# Patient Record
Sex: Female | Born: 1980
Health system: Southern US, Community
[De-identification: ages and names within clinical notes are randomized; demographics above are authoritative.]

## PROBLEM LIST (undated history)

## (undated) ENCOUNTER — Inpatient Hospital Stay (HOSPITAL_COMMUNITY): Payer: Self-pay

## (undated) ENCOUNTER — Ambulatory Visit: Admission: EM | Payer: 59

## (undated) DIAGNOSIS — Z87442 Personal history of urinary calculi: Secondary | ICD-10-CM

## (undated) DIAGNOSIS — T8859XA Other complications of anesthesia, initial encounter: Secondary | ICD-10-CM

## (undated) DIAGNOSIS — K219 Gastro-esophageal reflux disease without esophagitis: Secondary | ICD-10-CM

## (undated) DIAGNOSIS — T4145XA Adverse effect of unspecified anesthetic, initial encounter: Secondary | ICD-10-CM

## (undated) DIAGNOSIS — N059 Unspecified nephritic syndrome with unspecified morphologic changes: Secondary | ICD-10-CM

## (undated) DIAGNOSIS — D649 Anemia, unspecified: Secondary | ICD-10-CM

## (undated) DIAGNOSIS — G43909 Migraine, unspecified, not intractable, without status migrainosus: Secondary | ICD-10-CM

## (undated) DIAGNOSIS — M797 Fibromyalgia: Secondary | ICD-10-CM

## (undated) DIAGNOSIS — I10 Essential (primary) hypertension: Secondary | ICD-10-CM

## (undated) DIAGNOSIS — M359 Systemic involvement of connective tissue, unspecified: Secondary | ICD-10-CM

## (undated) DIAGNOSIS — M069 Rheumatoid arthritis, unspecified: Secondary | ICD-10-CM

## (undated) DIAGNOSIS — G5603 Carpal tunnel syndrome, bilateral upper limbs: Secondary | ICD-10-CM

## (undated) DIAGNOSIS — N920 Excessive and frequent menstruation with regular cycle: Secondary | ICD-10-CM

## (undated) HISTORY — DX: Rheumatoid arthritis, unspecified: M06.9

## (undated) HISTORY — PX: ENDOMETRIAL ABLATION: SHX621

## (undated) HISTORY — PX: TYMPANOSTOMY TUBE PLACEMENT: SHX32

## (undated) HISTORY — DX: Essential (primary) hypertension: I10

---
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
## 2005-05-05 ENCOUNTER — Emergency Department: Payer: Self-pay | Admitting: Emergency Medicine

## 2005-05-28 ENCOUNTER — Emergency Department: Payer: Self-pay | Admitting: Emergency Medicine

## 2006-02-03 ENCOUNTER — Emergency Department: Payer: Self-pay | Admitting: Emergency Medicine

## 2006-02-10 ENCOUNTER — Emergency Department: Payer: Self-pay | Admitting: Emergency Medicine

## 2006-02-12 ENCOUNTER — Ambulatory Visit: Payer: Self-pay | Admitting: Obstetrics & Gynecology

## 2006-02-12 ENCOUNTER — Ambulatory Visit: Payer: Self-pay | Admitting: Family Medicine

## 2006-02-15 ENCOUNTER — Emergency Department: Payer: Self-pay | Admitting: Emergency Medicine

## 2006-03-01 ENCOUNTER — Ambulatory Visit (HOSPITAL_COMMUNITY): Admission: RE | Admit: 2006-03-01 | Discharge: 2006-03-01 | Payer: Self-pay | Admitting: Gynecology

## 2006-03-01 IMAGING — US US OB COMP LESS 14 WK
1 series · 18 of 23 positions shown · non-contrast
Comparison: none

CLINICAL DATA: Uncertain menstrual dates.  Estimated LMP [DATE].  Hypertension.  Evaluate dating and viability.  
OBSTETRICAL ULTRASOUND <14 WKS AND TRANSVAGINAL OB US:
TECHNIQUE: Both transabdominal and transvaginal ultrasound examinations were performed for complete evaluation of the gestation as well as the maternal uterus, adnexal regions, and pelvic cul-de-sac.

[Series 1: us ob comp less 14 wks · 18 of 23 slices shown]
[im 1/23]
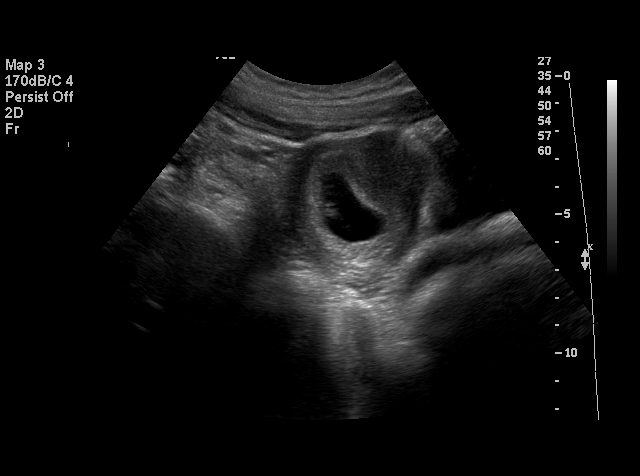
[im 2/23]
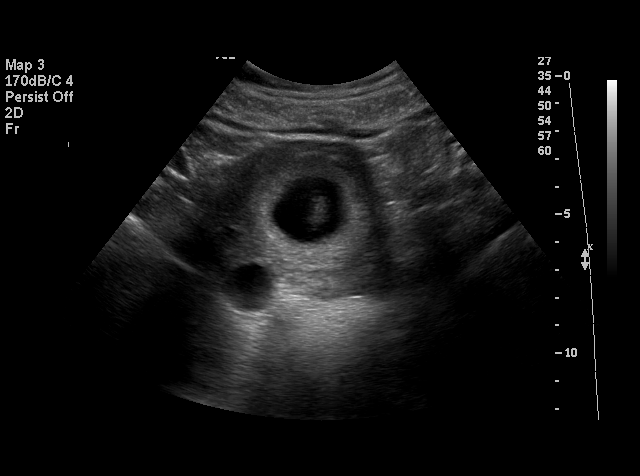
[im 4/23]
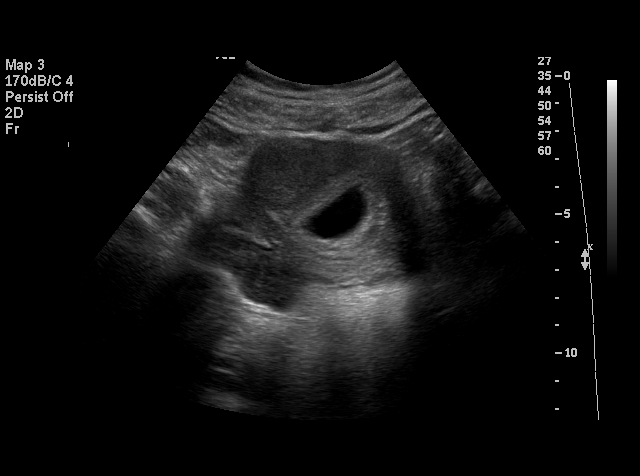
[im 5/23]
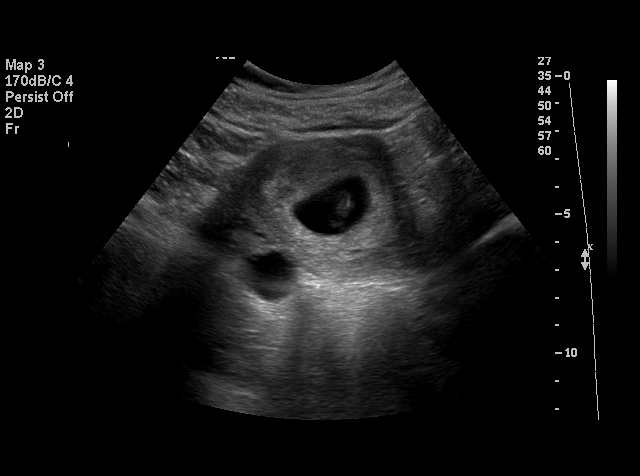
[im 6/23]
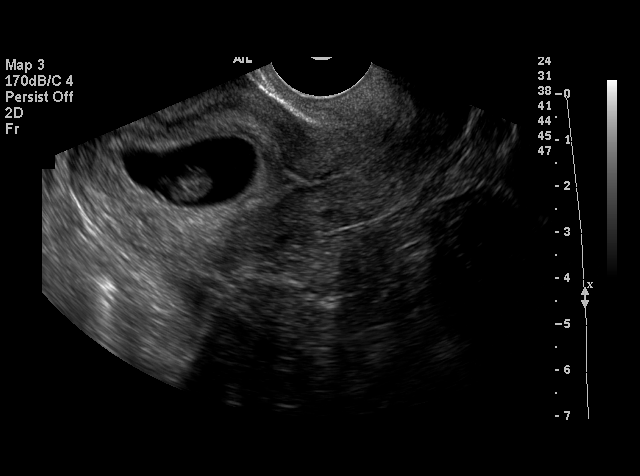
[im 8/23]
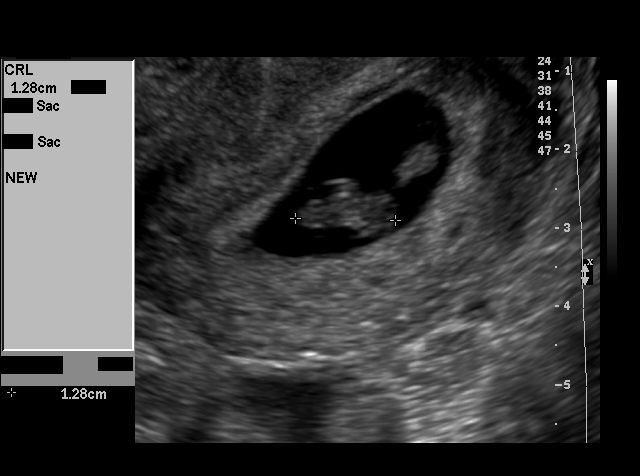
[im 9/23]
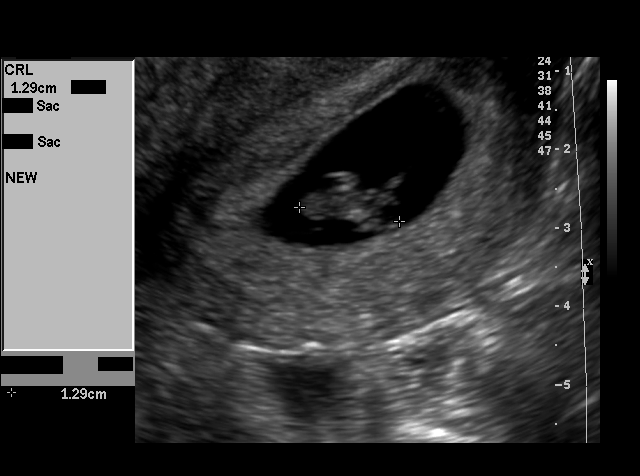
[im 10/23]
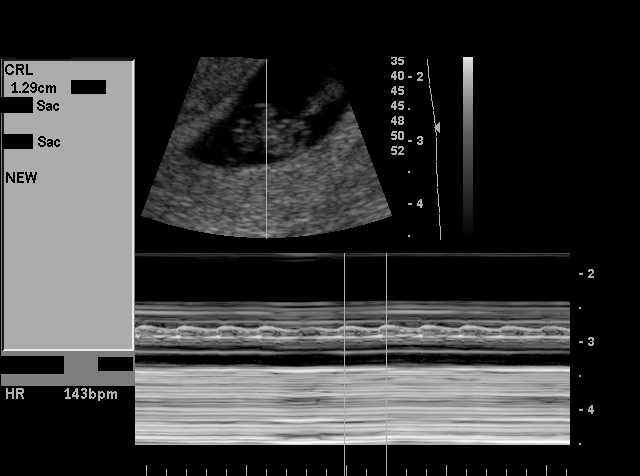
[im 11/23]
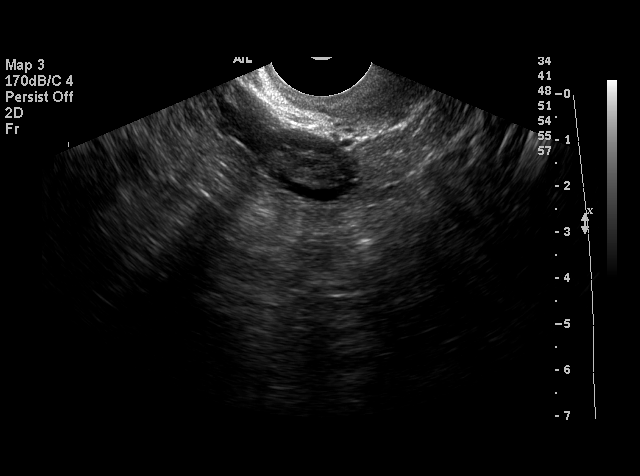
[im 13/23]
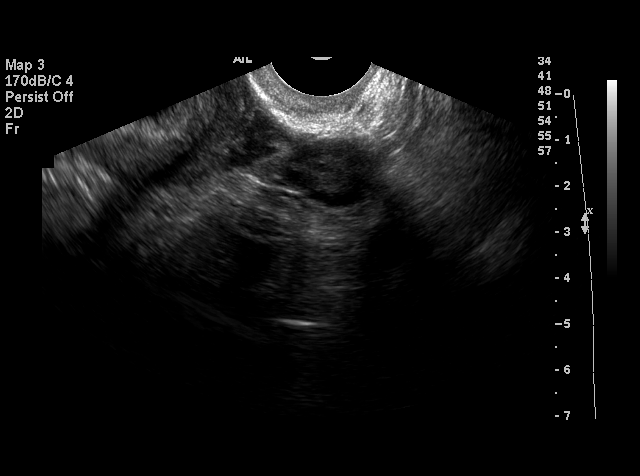
[im 14/23]
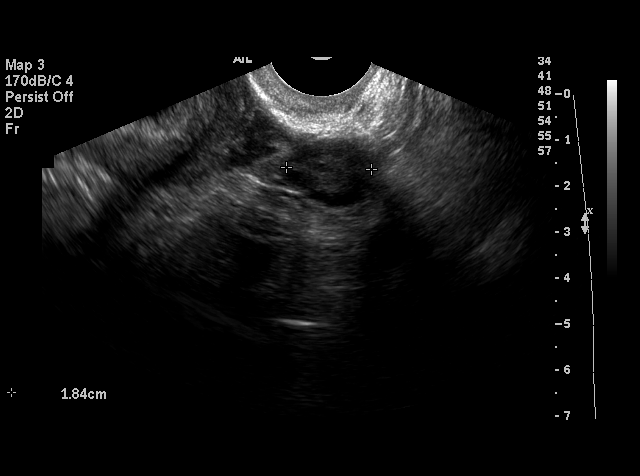
[im 15/23]
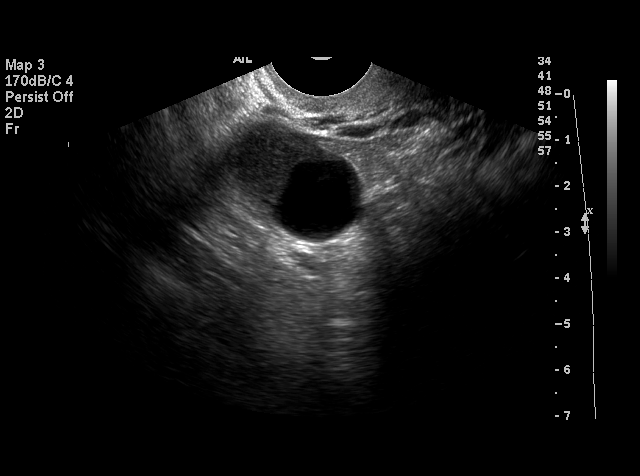
[im 16/23]
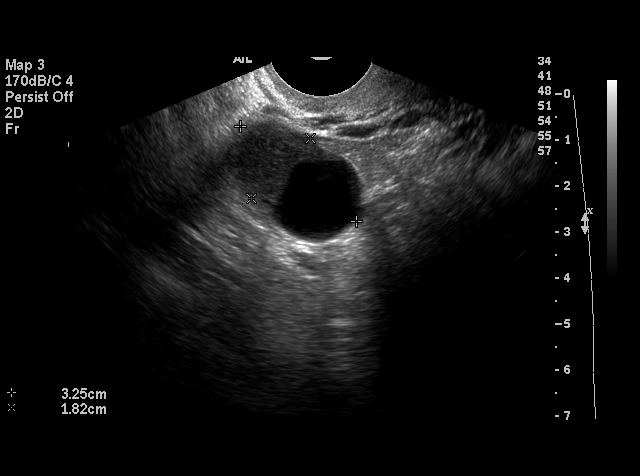
[im 18/23]
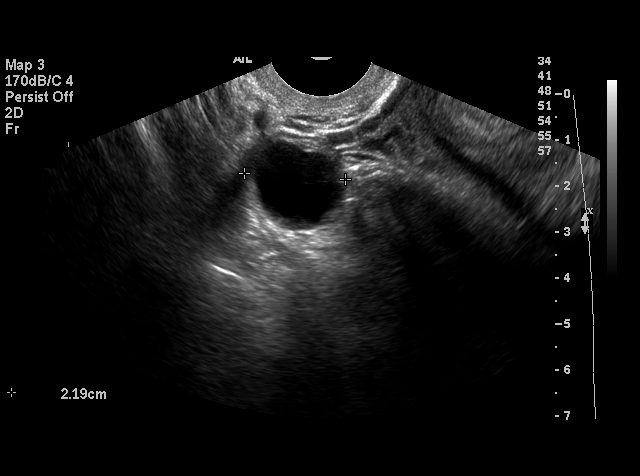
[im 19/23]
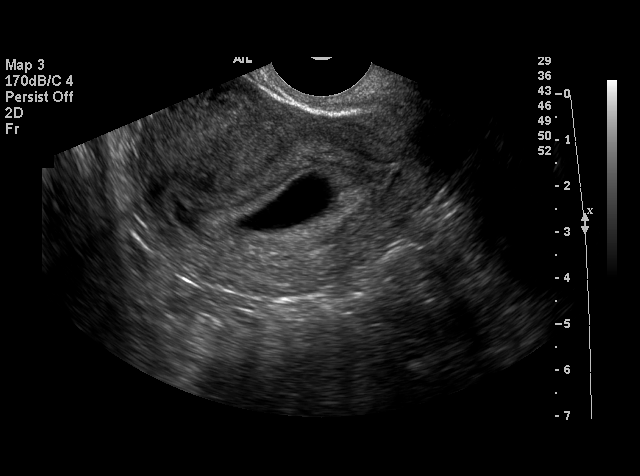
[im 20/23]
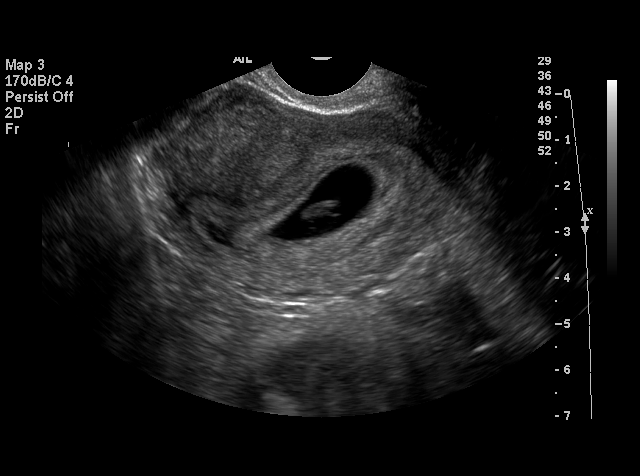
[im 22/23]
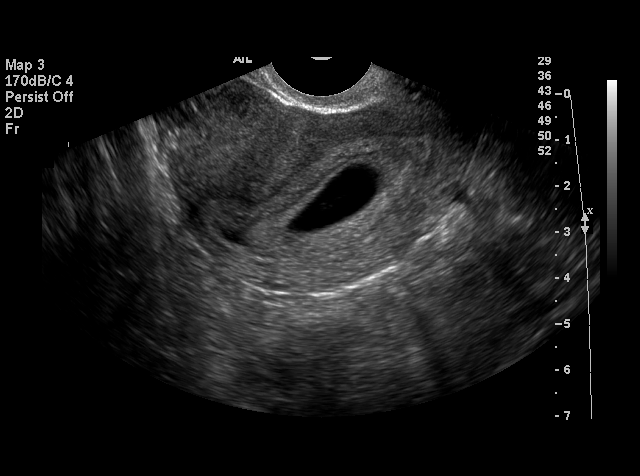
[im 23/23]
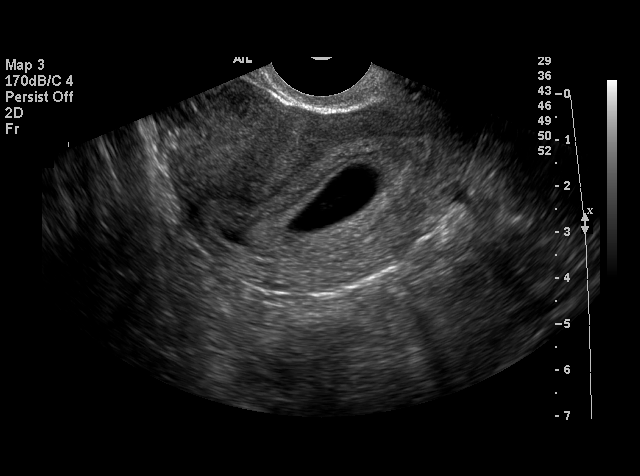

[18 of 23 positions shown; findings below may reference images not displayed]

FINDINGS: A single living intrauterine gestation is seen with measured heart rate of 143.  Embryonic crown-rump length measures 1.3 cm, corresponding with a gestational age of 7 weeks 4 days.  Normal yolk sac is seen.  A tiny implantation bleed is incidentally noted.  No fibroids other uterine abnormalities are identified.  
The right ovary contains a small corpus luteum cyst measuring approximately 2 cm.  Left ovary is normal on appearance.  No adnexal masses or free fluid are identified my either transabdominal or transvaginal sonography.
IMPRESSION: 1.  Single living intrauterine gestation with estimated gestational age of 7 weeks 4 days and sonographic EDC of [DATE].  This is concordant with stated LMP.  
2.  2 cm right ovarian corpus luteum cyst.  No evidence of adnexal mass or free fluid.

## 2006-03-07 ENCOUNTER — Encounter: Payer: Self-pay | Admitting: Obstetrics & Gynecology

## 2006-03-07 ENCOUNTER — Ambulatory Visit: Payer: Self-pay | Admitting: Obstetrics & Gynecology

## 2006-03-12 ENCOUNTER — Ambulatory Visit: Payer: Self-pay | Admitting: Obstetrics & Gynecology

## 2006-04-04 ENCOUNTER — Ambulatory Visit: Payer: Self-pay | Admitting: Obstetrics & Gynecology

## 2006-04-30 ENCOUNTER — Ambulatory Visit (HOSPITAL_COMMUNITY): Admission: RE | Admit: 2006-04-30 | Discharge: 2006-04-30 | Payer: Self-pay | Admitting: Obstetrics and Gynecology

## 2006-05-09 ENCOUNTER — Ambulatory Visit: Payer: Self-pay | Admitting: Obstetrics & Gynecology

## 2006-05-13 ENCOUNTER — Ambulatory Visit (HOSPITAL_COMMUNITY): Admission: RE | Admit: 2006-05-13 | Discharge: 2006-05-13 | Payer: Self-pay

## 2006-05-13 IMAGING — US US OB DETAIL+14 WK
1 series · 14 of 28 positions shown · non-contrast
Comparison: none

OBSTETRICAL ULTRASOUND:
 This ultrasound was performed in The [HOSPITAL], and the [facility] report will be stored to [REDACTED] PACS.

[Series 1: us ob detail+14 wk · 14 of 76 slices shown]
[im 3/76]
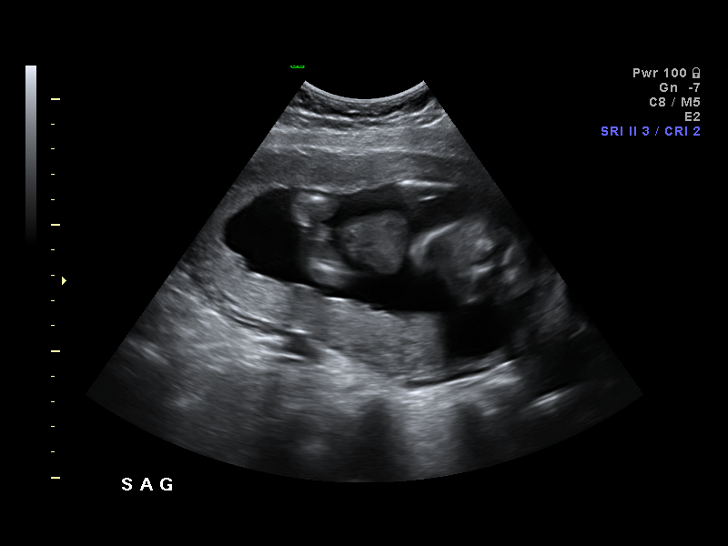
[im 9/76]
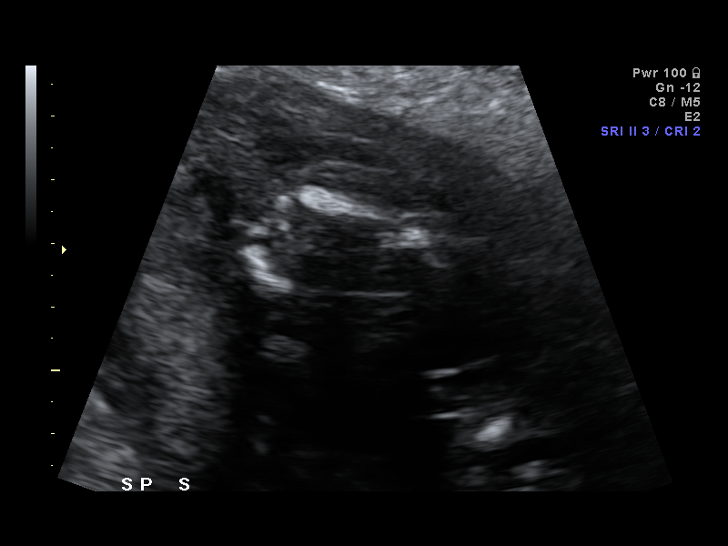
[im 14/76]
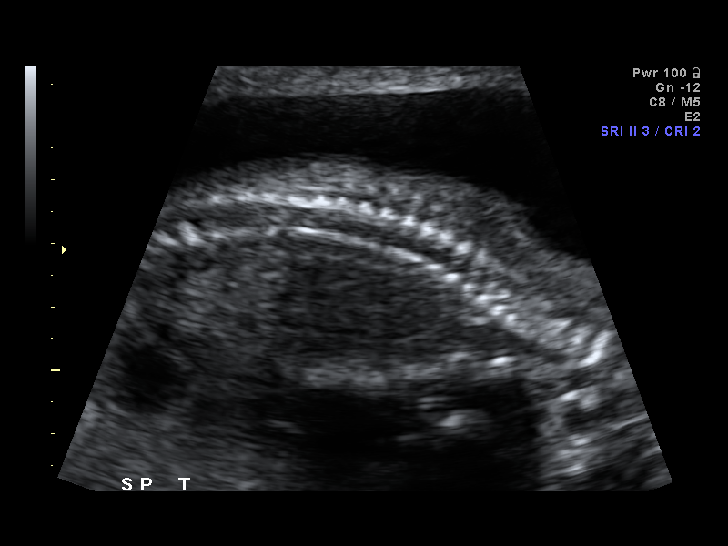
[im 20/76]
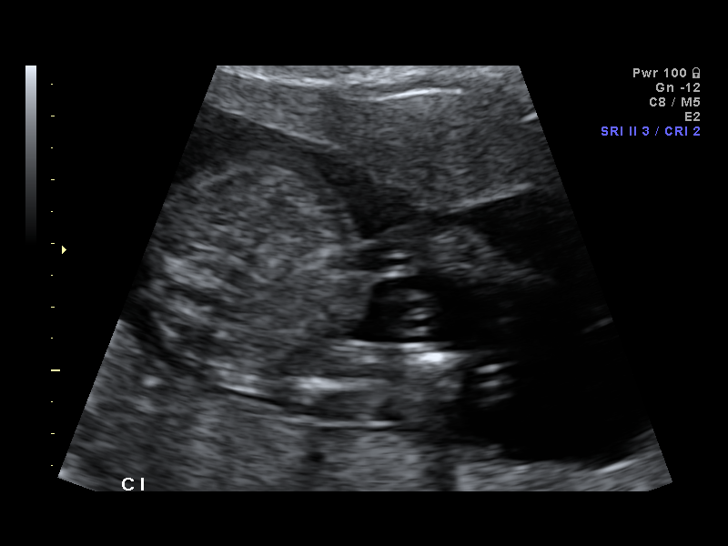
[im 26/76]
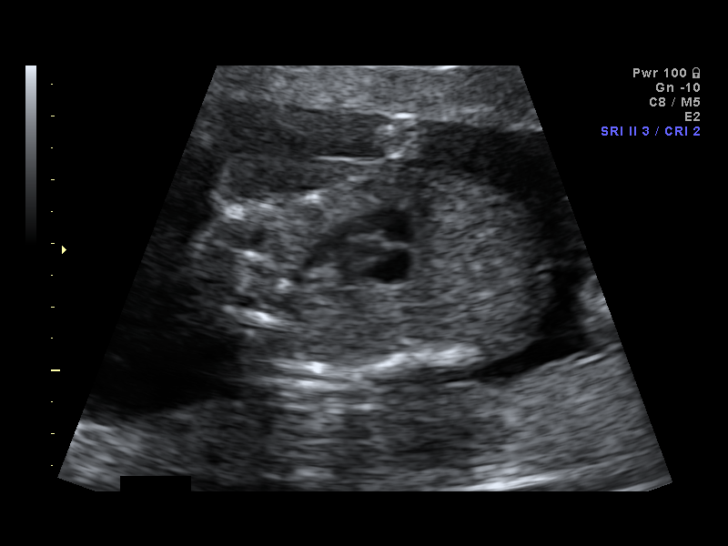
[im 31/76]
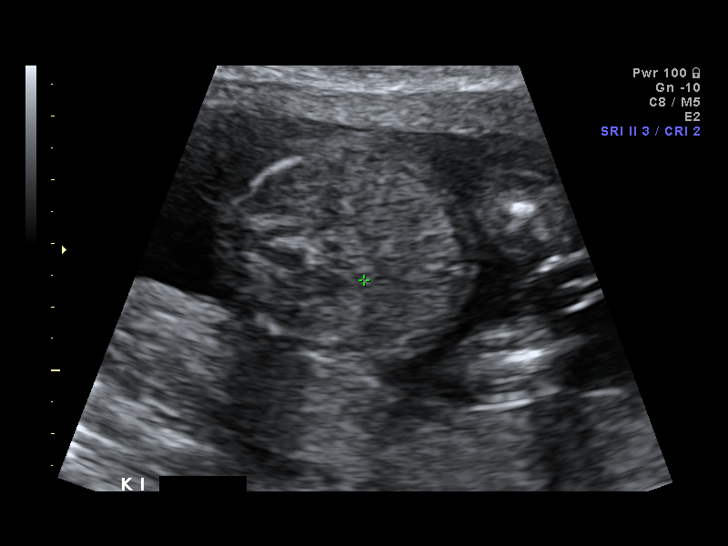
[im 37/76]
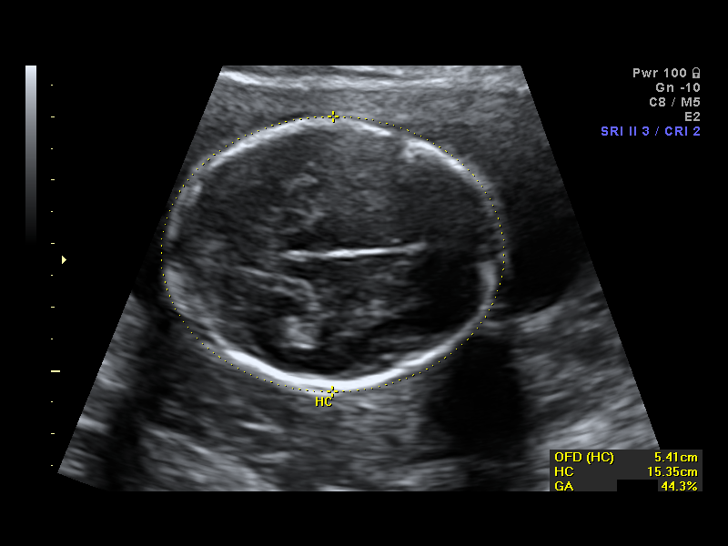
[im 42/76]
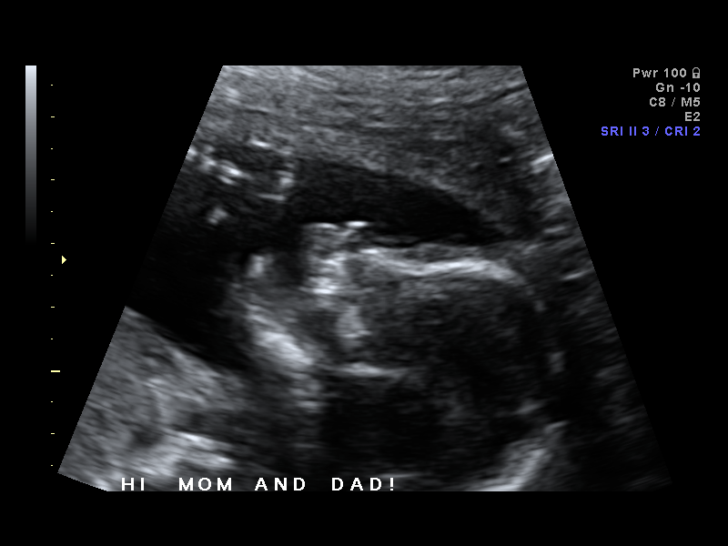
[im 48/76]
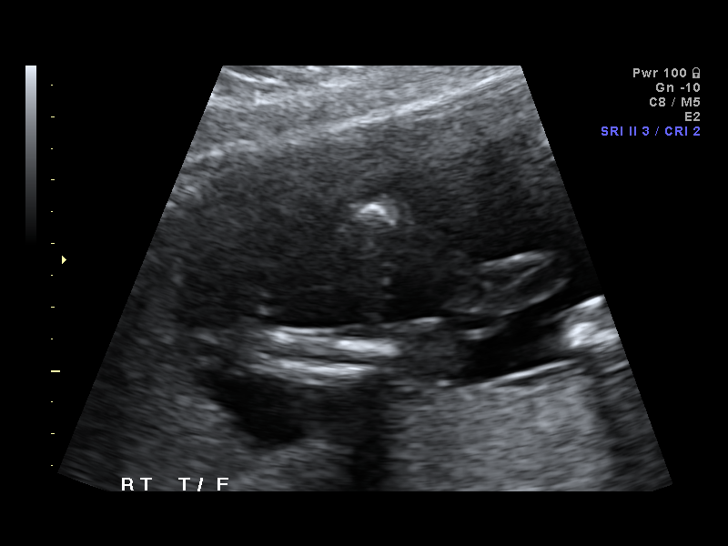
[im 53/76]
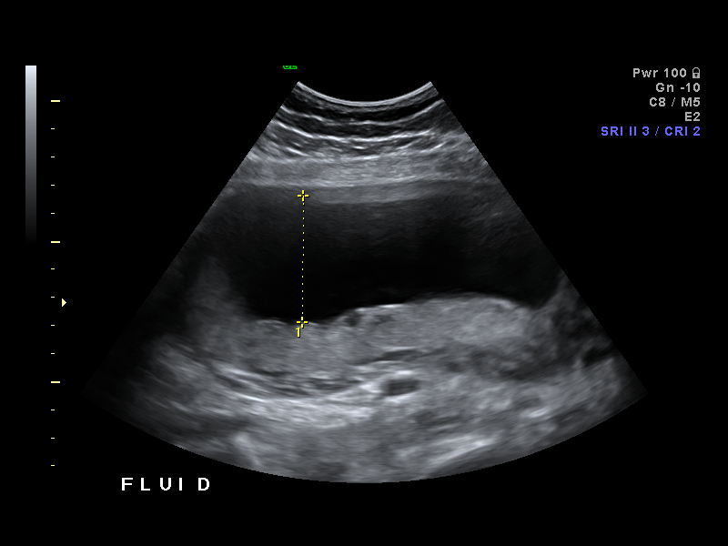
[im 59/76]
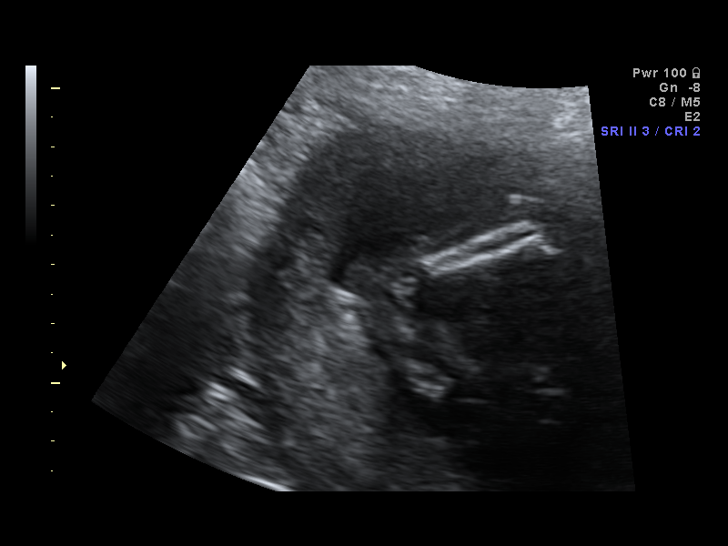
[im 64/76]
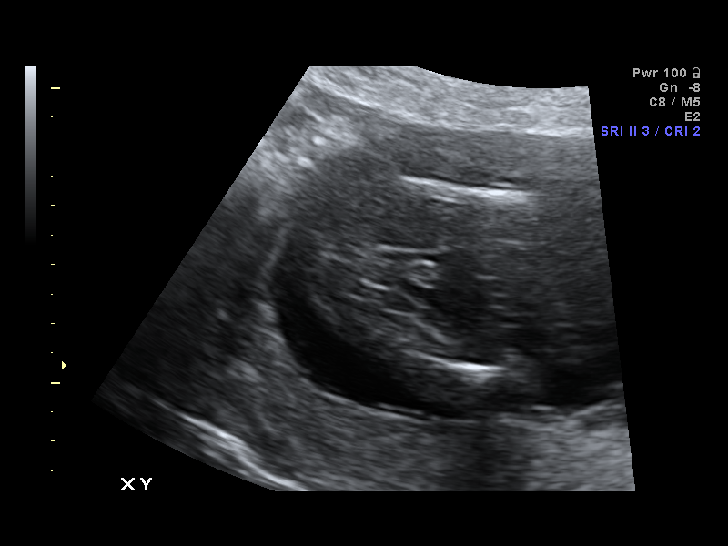
[im 70/76]
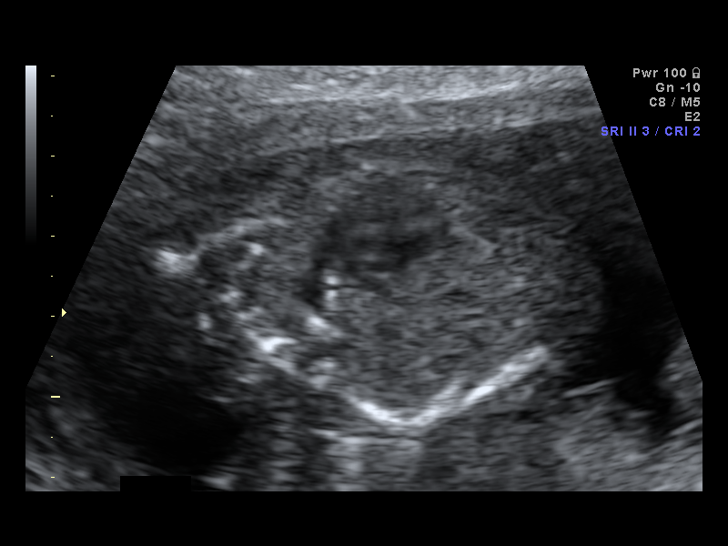
[im 76/76]
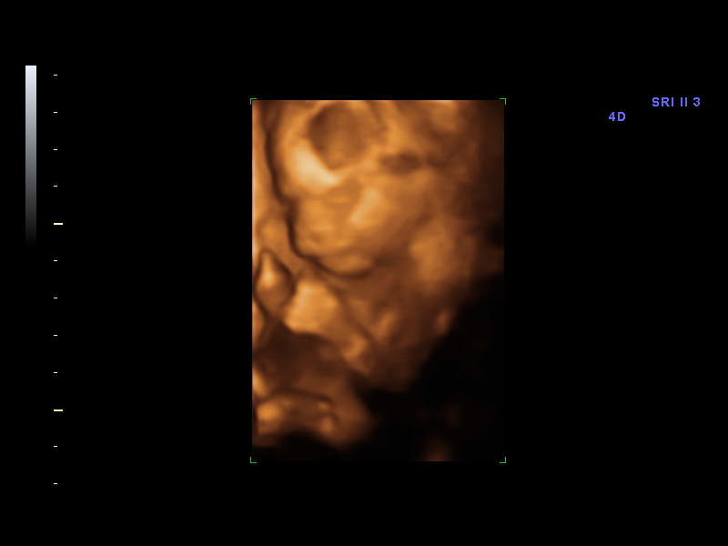

[14 of 28 positions shown; findings below may reference images not displayed]

IMPRESSION: The [facility] report has also been faxed to the ordering physician.

## 2006-05-18 ENCOUNTER — Inpatient Hospital Stay (HOSPITAL_COMMUNITY): Admission: AD | Admit: 2006-05-18 | Discharge: 2006-05-18 | Payer: Self-pay | Admitting: Gynecology

## 2006-05-30 ENCOUNTER — Ambulatory Visit: Payer: Self-pay | Admitting: Obstetrics & Gynecology

## 2006-06-17 ENCOUNTER — Ambulatory Visit (HOSPITAL_COMMUNITY): Admission: RE | Admit: 2006-06-17 | Discharge: 2006-06-17 | Payer: Self-pay | Admitting: Obstetrics & Gynecology

## 2006-06-17 IMAGING — US US OB FOLLOW-UP
1 series · 14 of 28 positions shown · non-contrast
Comparison: none

OBSTETRICAL ULTRASOUND:
 This ultrasound was performed in The [HOSPITAL], and the [facility] report will be stored to [REDACTED] PACS.

[Series 1: us ob follow-up · 14 of 39 slices shown]
[im 2/39]
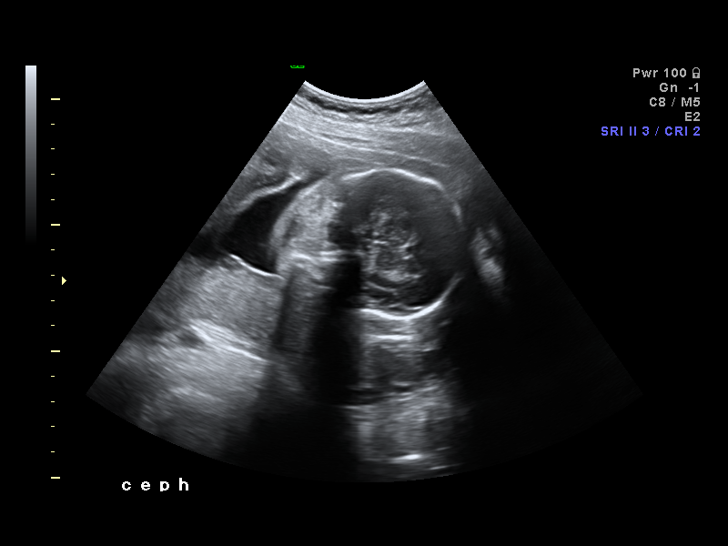
[im 5/39]
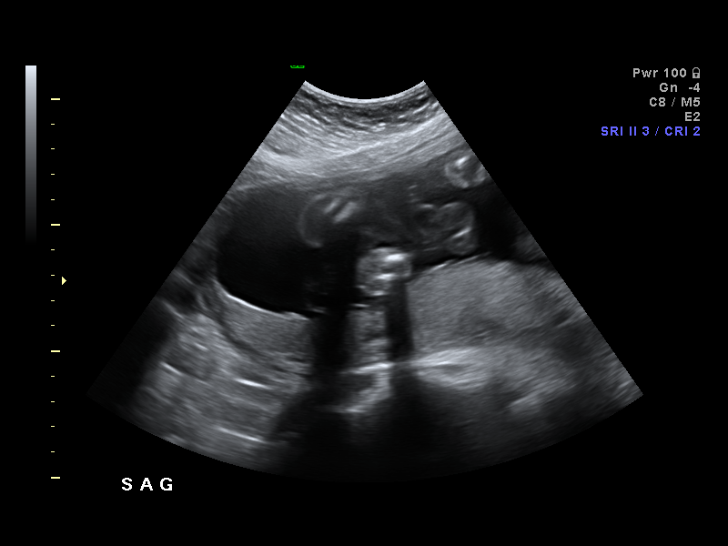
[im 8/39]
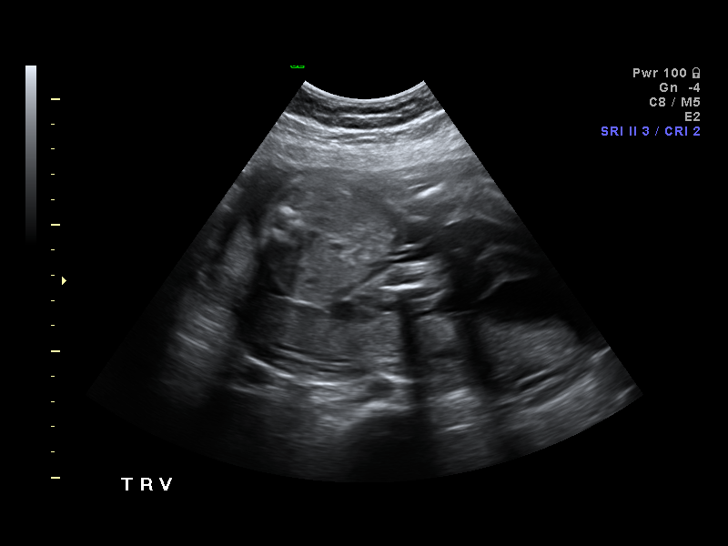
[im 10/39]
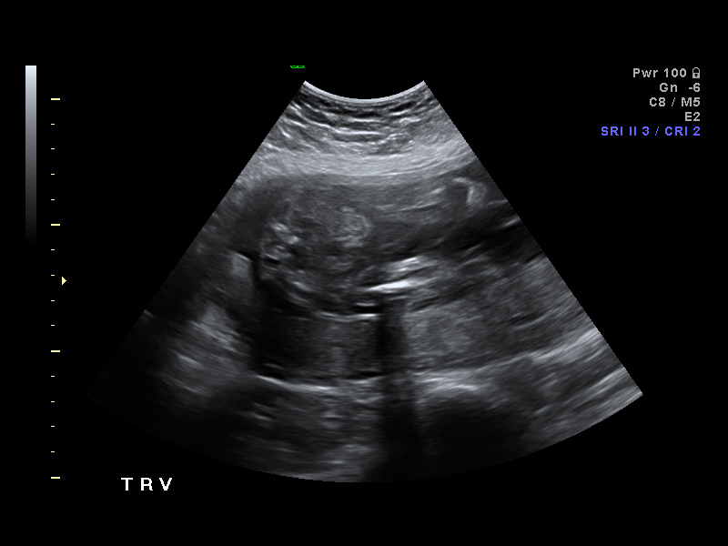
[im 13/39]
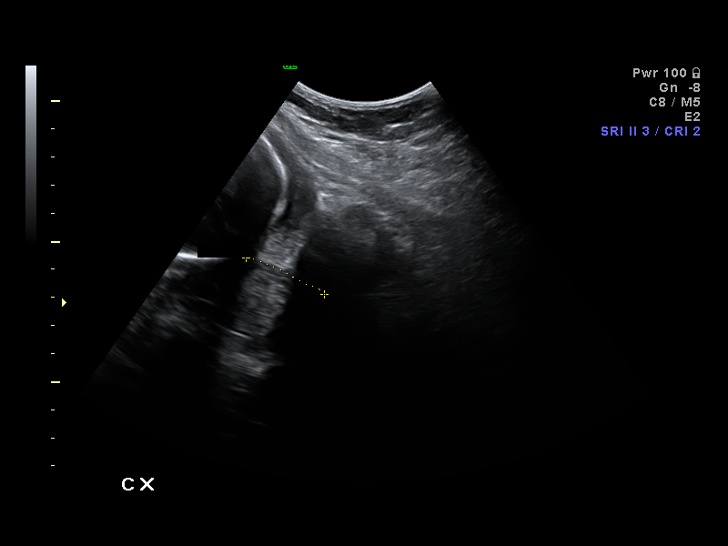
[im 16/39]
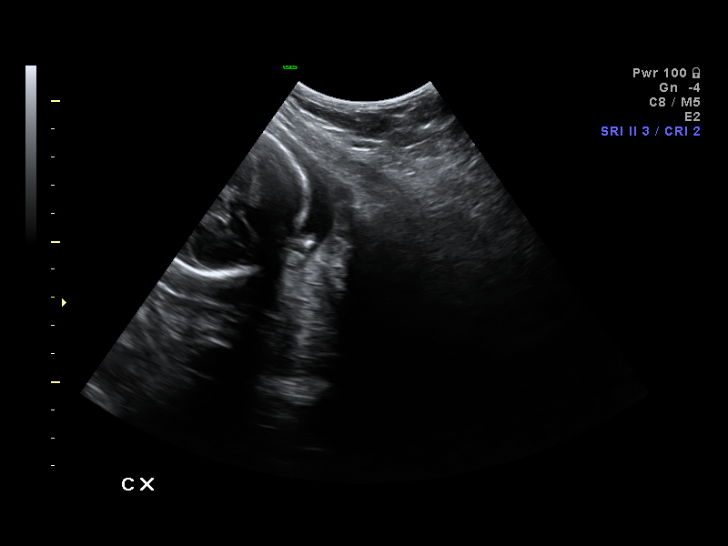
[im 19/39]
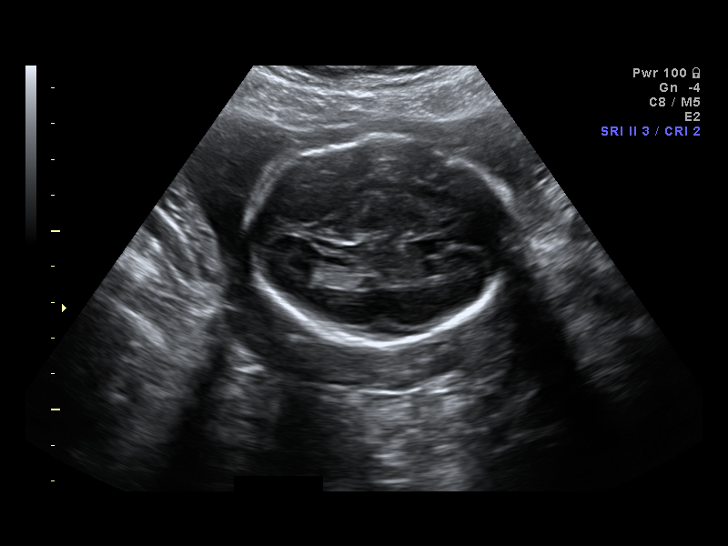
[im 22/39]
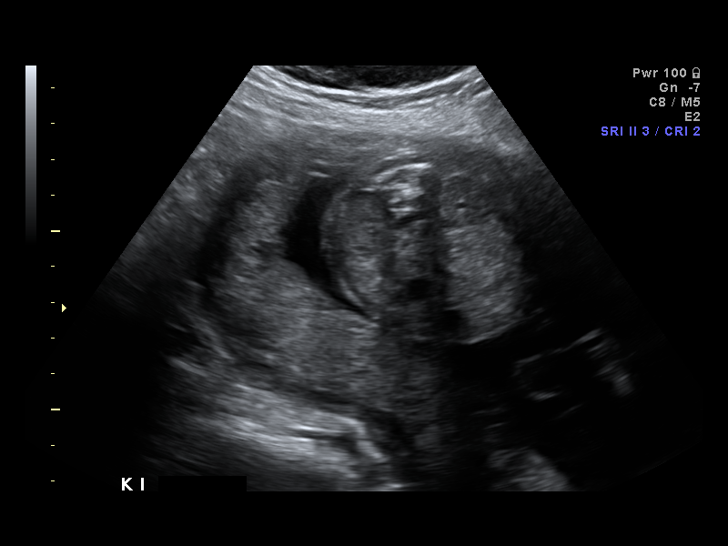
[im 24/39]
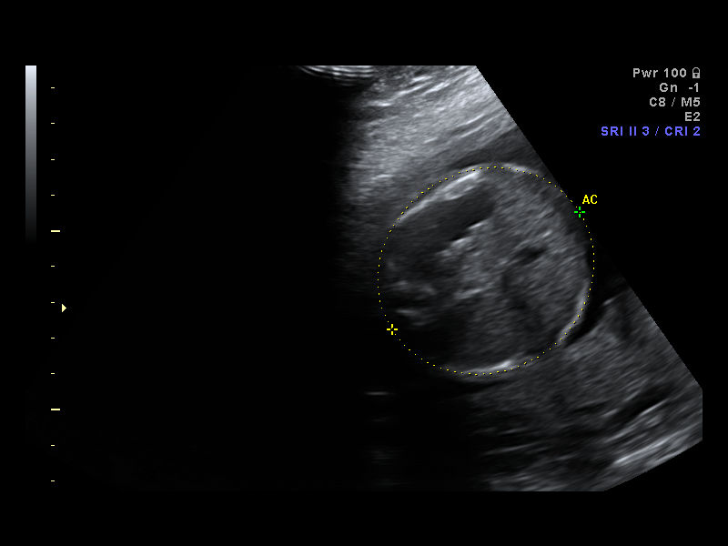
[im 27/39]
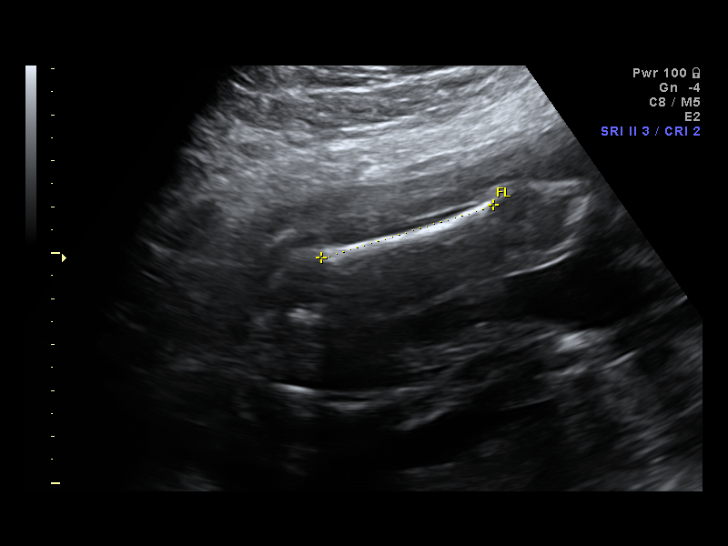
[im 30/39]
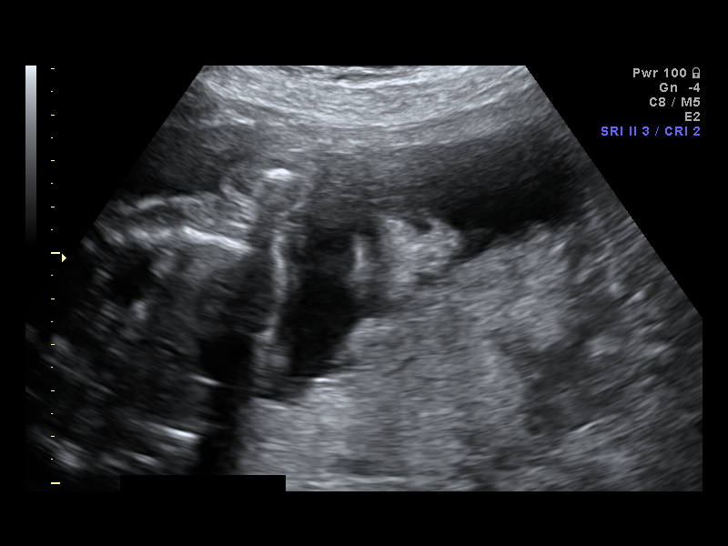
[im 33/39]
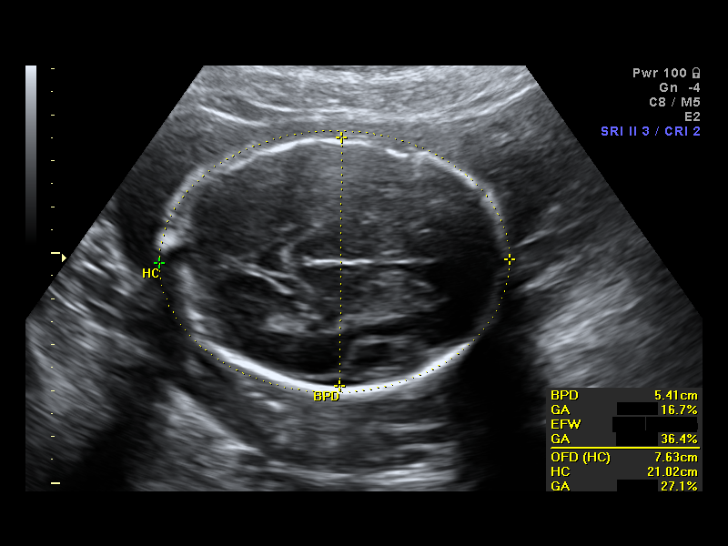
[im 36/39]
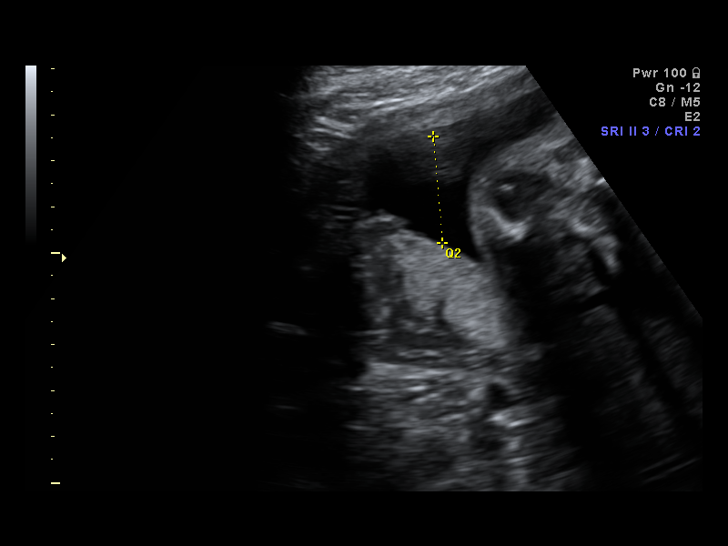
[im 39/39]
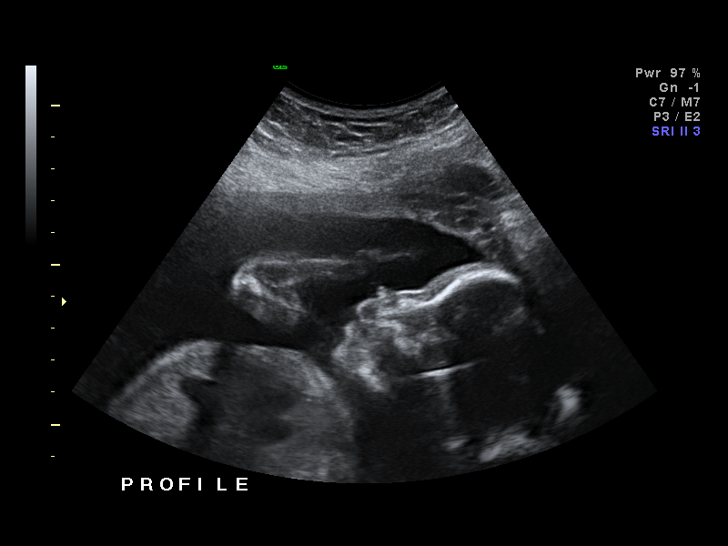

[14 of 28 positions shown; findings below may reference images not displayed]

IMPRESSION: The [facility] report has also been faxed to the ordering physician.

## 2006-06-20 ENCOUNTER — Ambulatory Visit: Payer: Self-pay | Admitting: Obstetrics & Gynecology

## 2006-06-29 ENCOUNTER — Inpatient Hospital Stay (HOSPITAL_COMMUNITY): Admission: AD | Admit: 2006-06-29 | Discharge: 2006-06-29 | Payer: Self-pay | Admitting: Obstetrics and Gynecology

## 2006-06-29 ENCOUNTER — Ambulatory Visit: Payer: Self-pay | Admitting: *Deleted

## 2006-07-11 ENCOUNTER — Ambulatory Visit: Payer: Self-pay | Admitting: Obstetrics & Gynecology

## 2006-07-19 ENCOUNTER — Ambulatory Visit (HOSPITAL_COMMUNITY): Admission: RE | Admit: 2006-07-19 | Discharge: 2006-07-19 | Payer: Self-pay | Admitting: Obstetrics & Gynecology

## 2006-07-19 IMAGING — US US OB FOLLOW-UP
1 series · 14 of 28 positions shown · non-contrast
Comparison: none

OBSTETRICAL ULTRASOUND:
 This ultrasound was performed in The [HOSPITAL], and the [facility] report will be stored to [REDACTED] PACS.

[Series 1: us ob follow-up · 14 of 54 slices shown]
[im 2/54]
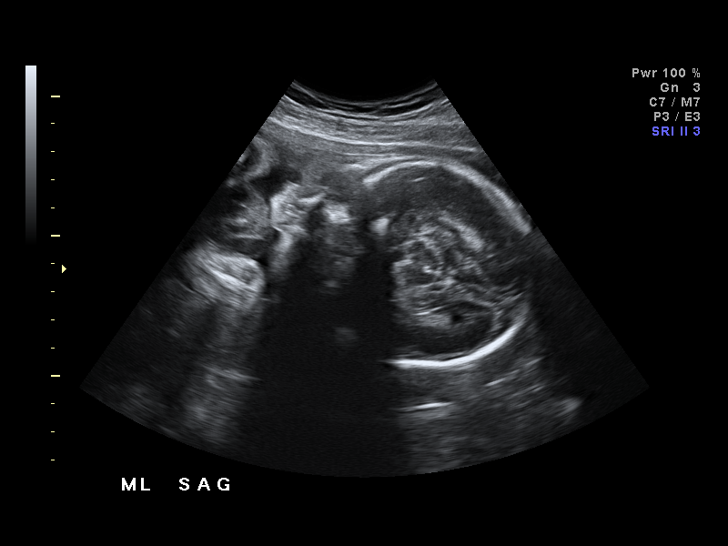
[im 6/54]
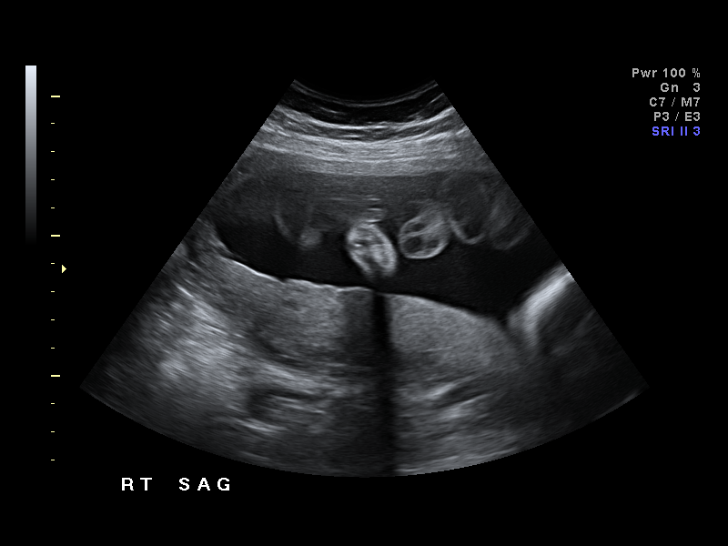
[im 10/54]
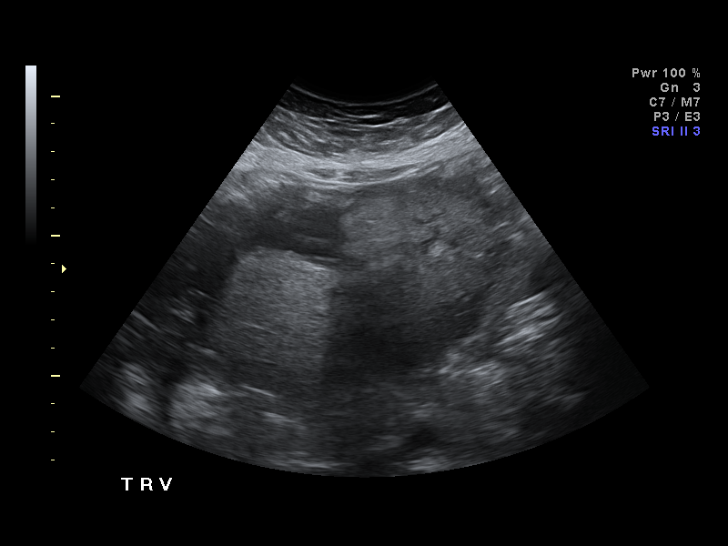
[im 14/54]
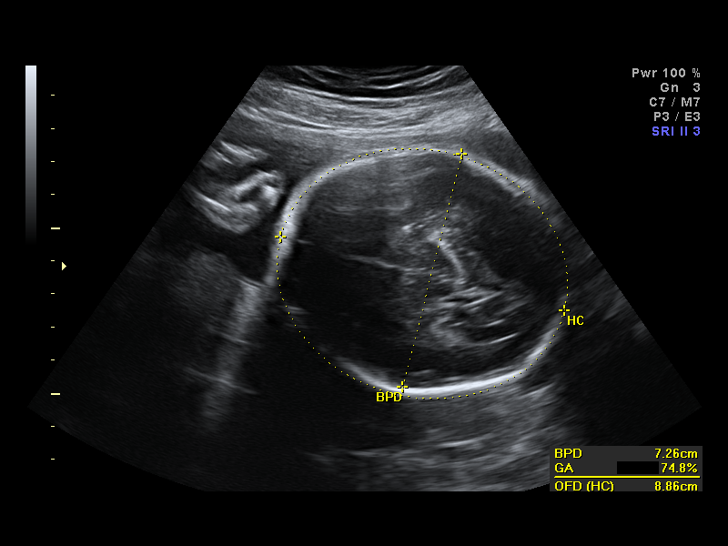
[im 18/54]
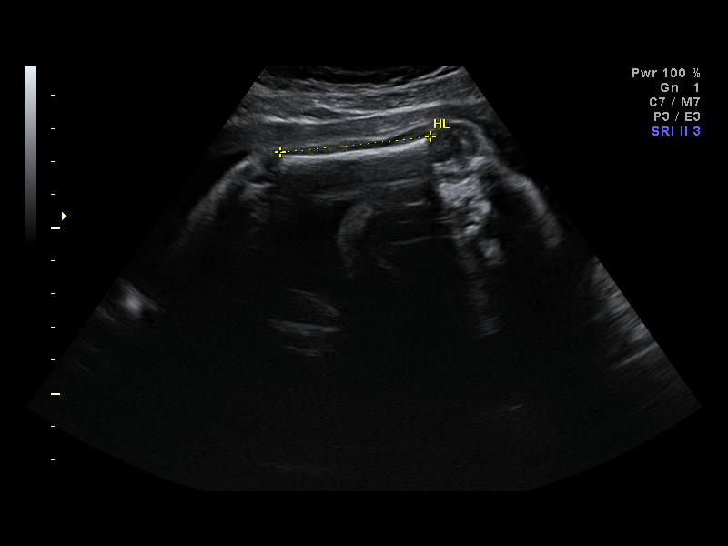
[im 22/54]
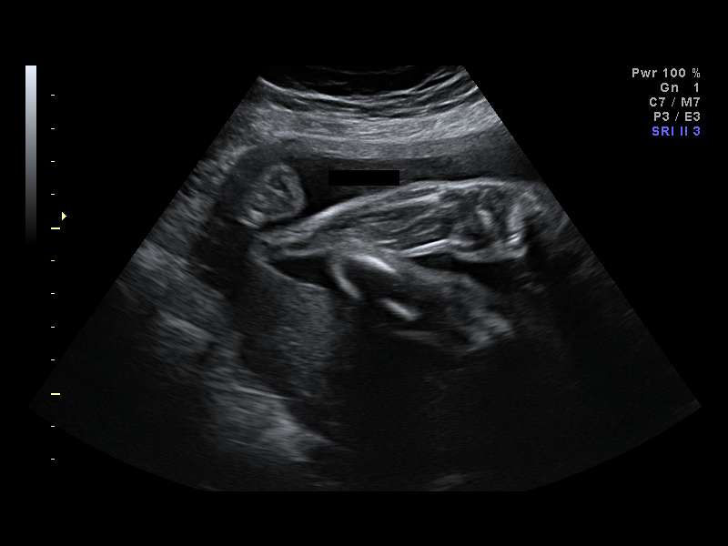
[im 26/54]
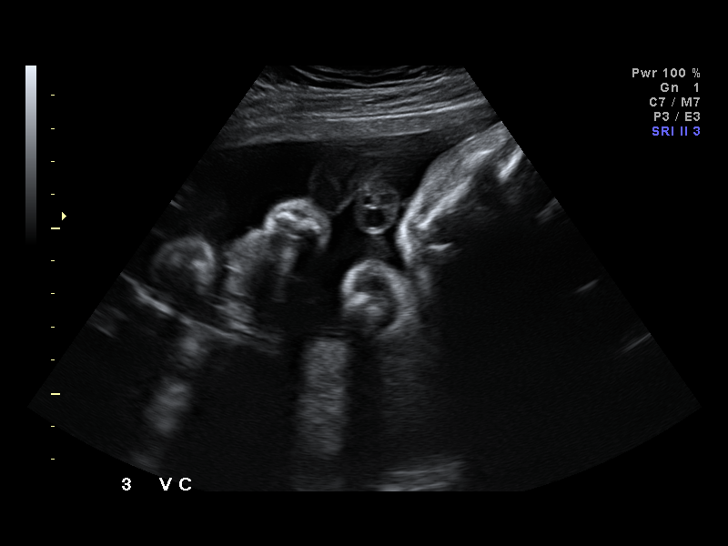
[im 30/54]
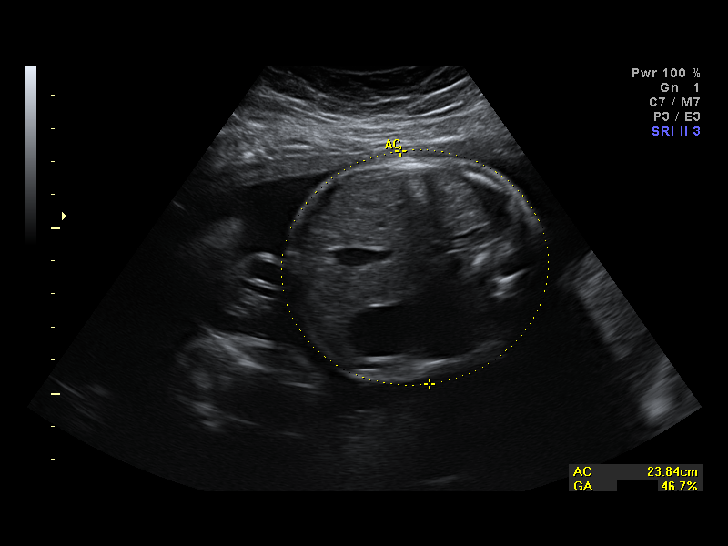
[im 34/54]
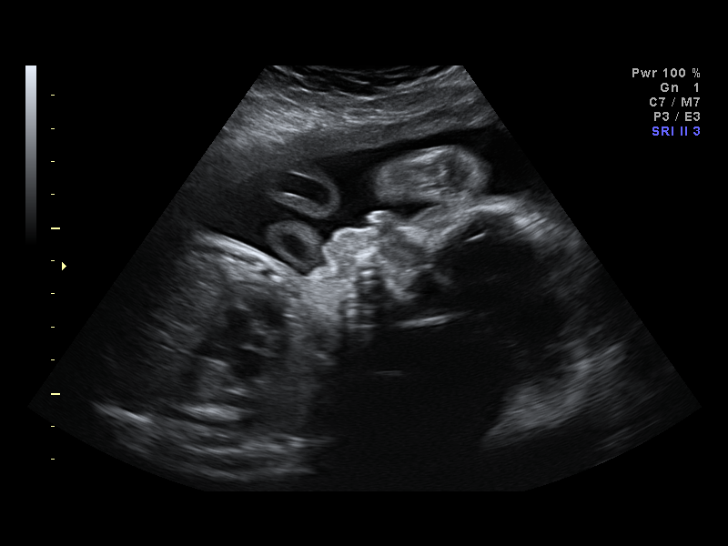
[im 38/54]
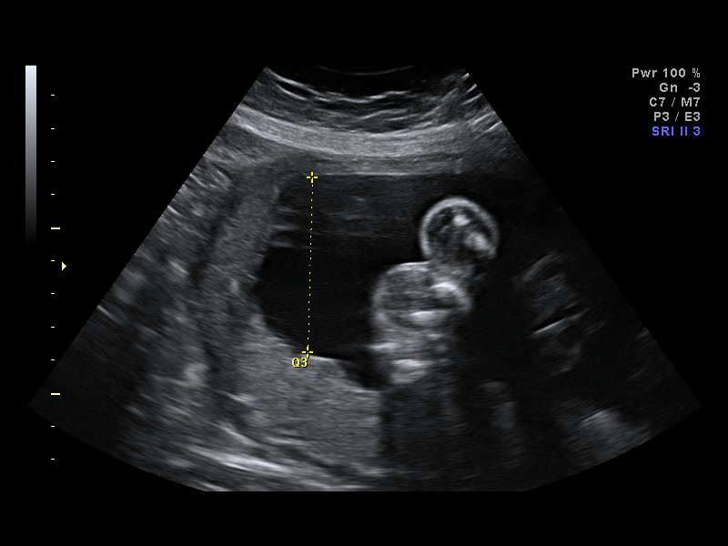
[im 42/54]
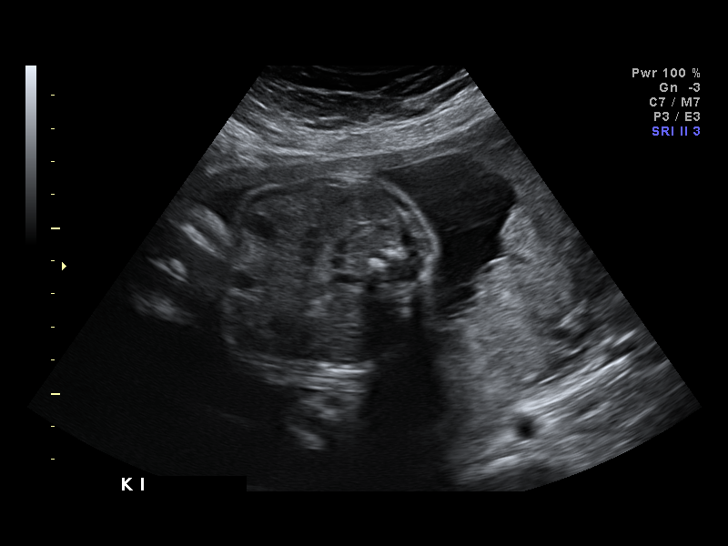
[im 46/54]
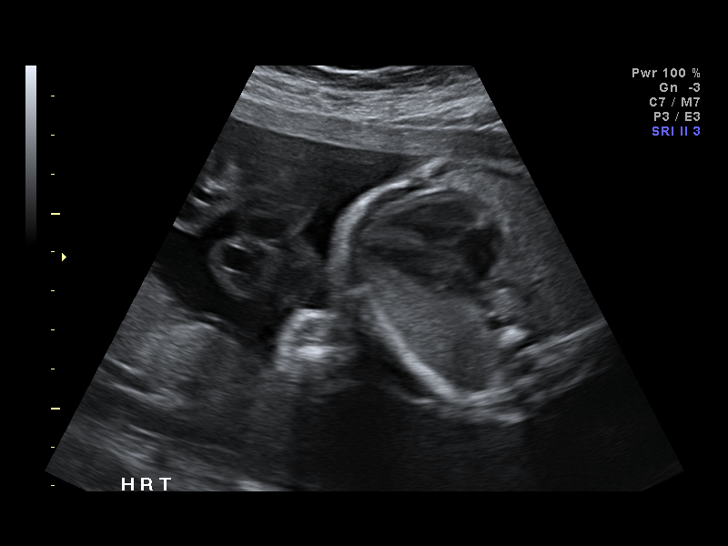
[im 50/54]
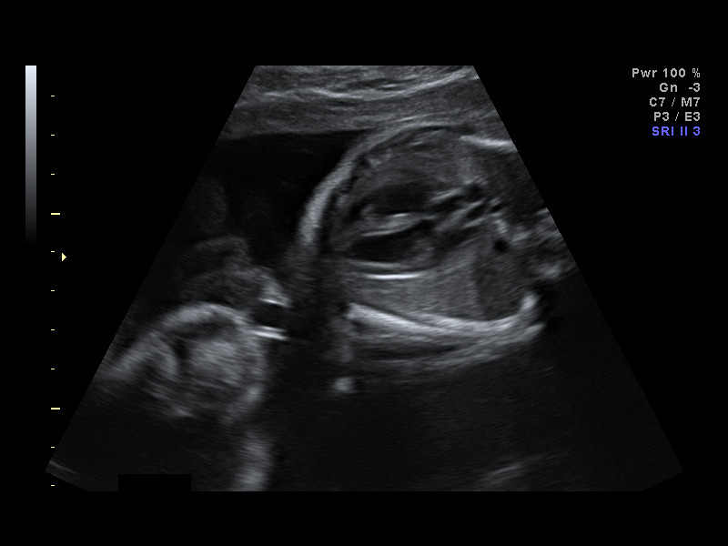
[im 54/54]
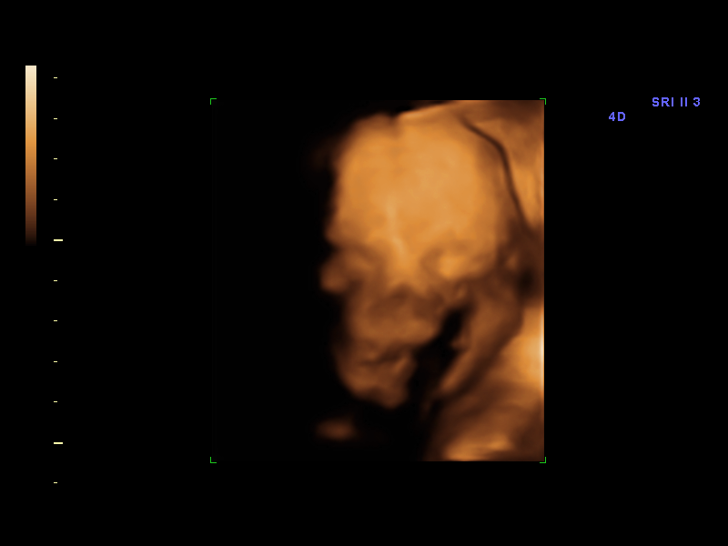

[14 of 28 positions shown; findings below may reference images not displayed]

IMPRESSION: The [facility] report has also been faxed to the ordering physician.

## 2006-07-25 ENCOUNTER — Ambulatory Visit: Payer: Self-pay | Admitting: Obstetrics & Gynecology

## 2006-08-01 ENCOUNTER — Ambulatory Visit: Payer: Self-pay | Admitting: Obstetrics & Gynecology

## 2006-08-12 ENCOUNTER — Ambulatory Visit: Payer: Self-pay | Admitting: Obstetrics & Gynecology

## 2006-08-13 ENCOUNTER — Ambulatory Visit: Payer: Self-pay | Admitting: Family Medicine

## 2006-08-15 ENCOUNTER — Ambulatory Visit: Payer: Self-pay | Admitting: Obstetrics & Gynecology

## 2006-08-19 ENCOUNTER — Ambulatory Visit (HOSPITAL_COMMUNITY): Admission: RE | Admit: 2006-08-19 | Discharge: 2006-08-19 | Payer: Self-pay | Admitting: Obstetrics and Gynecology

## 2006-08-19 IMAGING — US US OB FOLLOW-UP
1 series · 14 of 28 positions shown · non-contrast
Comparison: none

OBSTETRICAL ULTRASOUND:
 This ultrasound was performed in The [HOSPITAL], and the [facility] report will be stored to [REDACTED] PACS.

[Series 1: us ob follow-up · 14 of 32 slices shown]
[im 2/32]
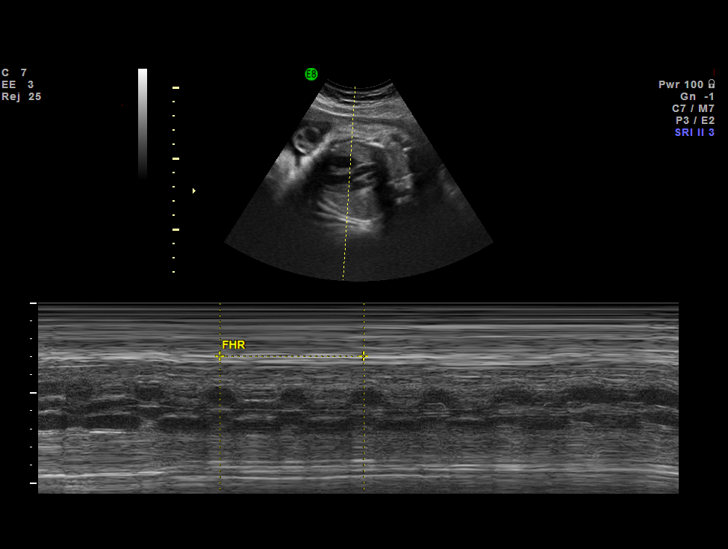
[im 4/32]
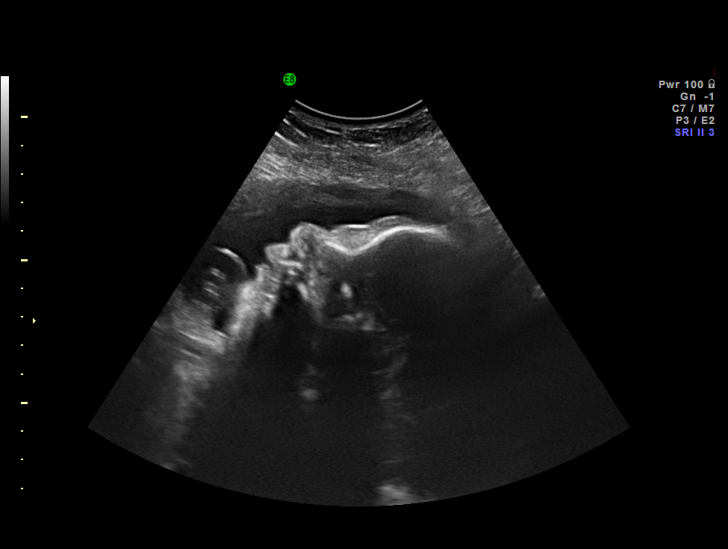
[im 6/32]
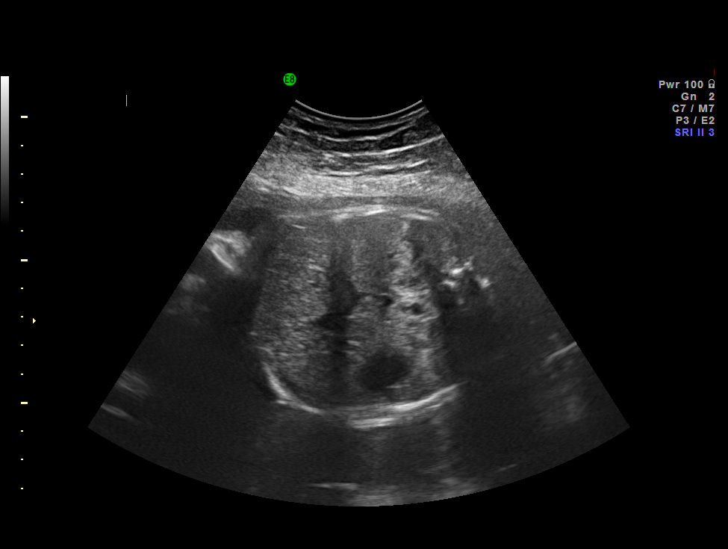
[im 9/32]
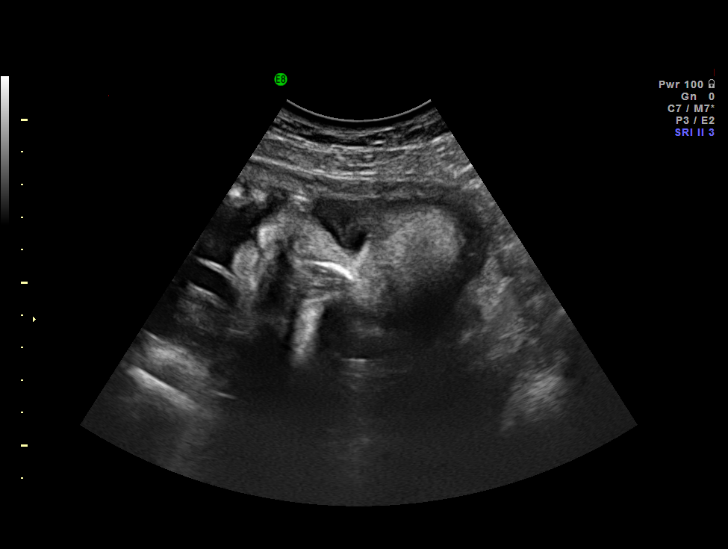
[im 11/32]
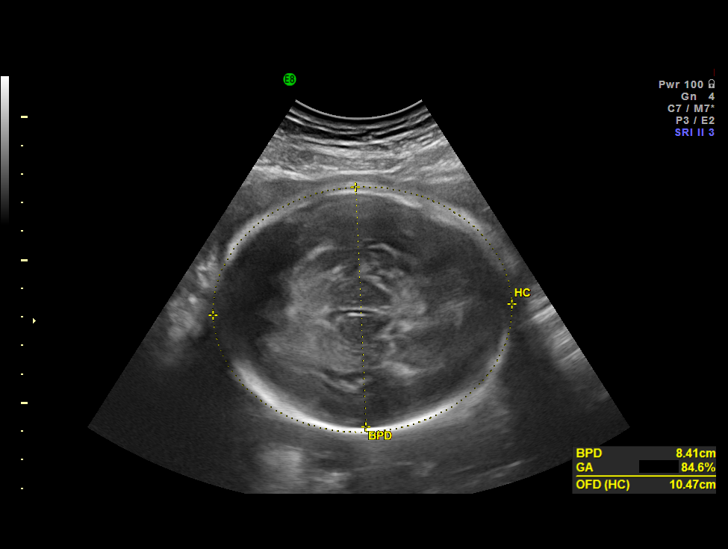
[im 13/32]
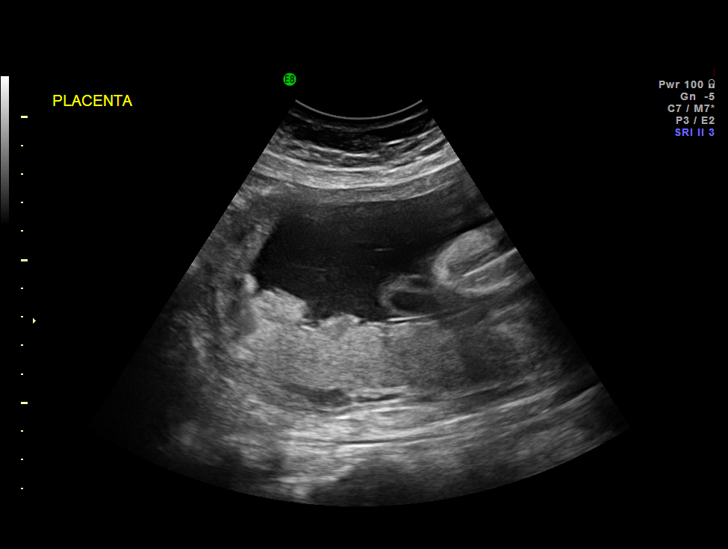
[im 15/32]
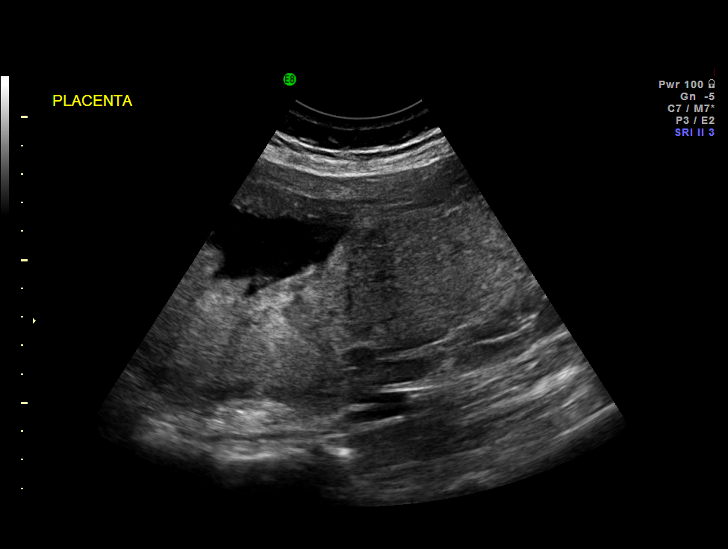
[im 18/32]
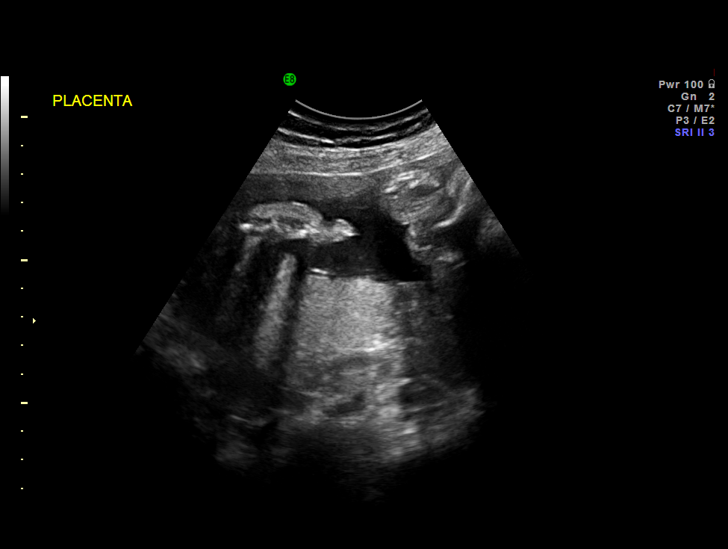
[im 20/32]
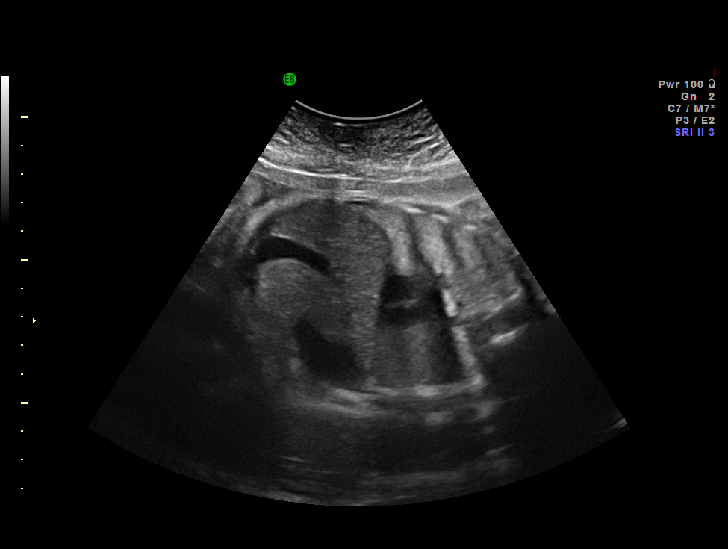
[im 22/32]
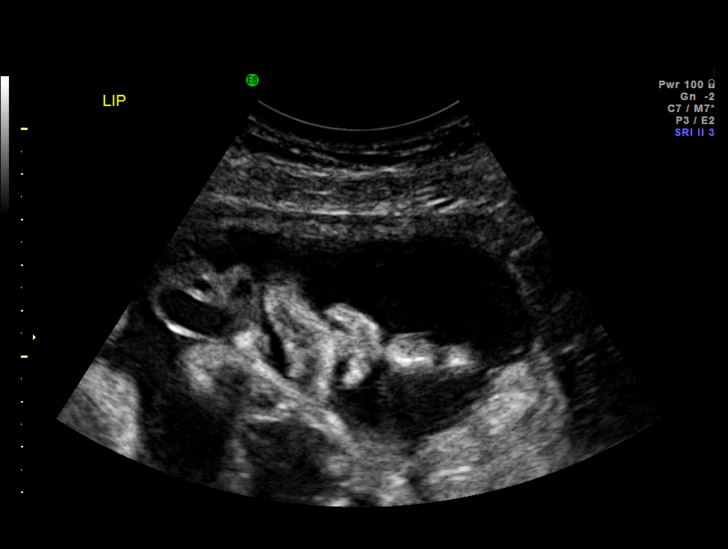
[im 25/32]
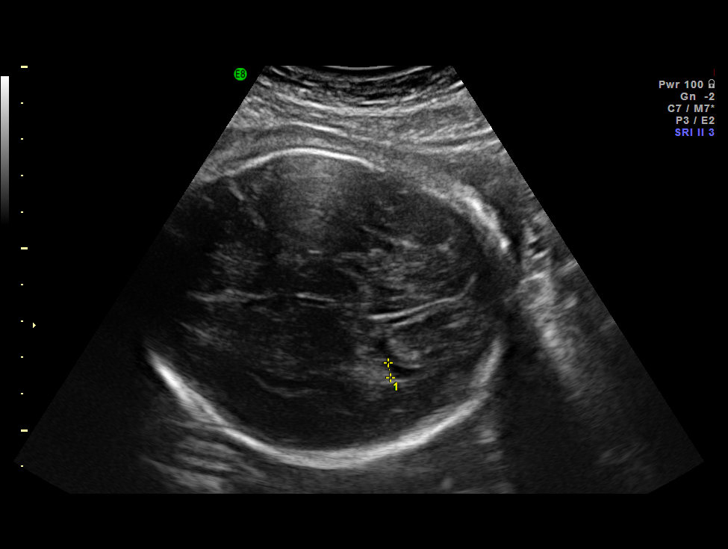
[im 27/32]
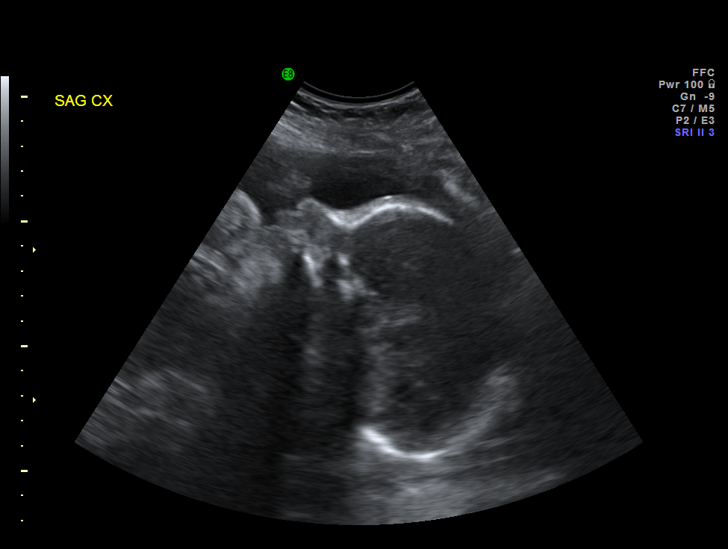
[im 29/32]
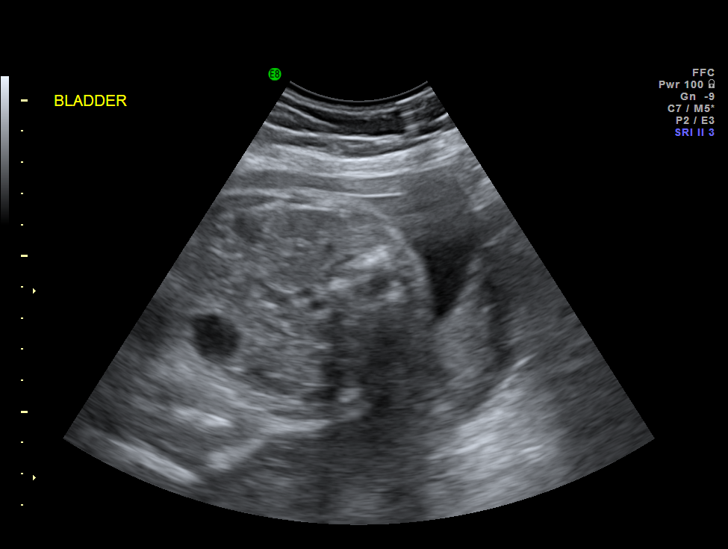
[im 32/32]
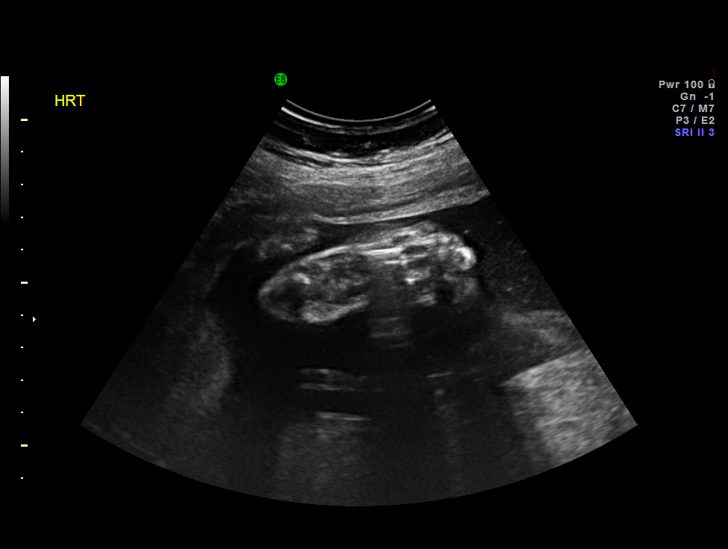

[14 of 28 positions shown; findings below may reference images not displayed]

IMPRESSION: The [facility] report has also been faxed to the ordering physician.

## 2006-08-22 ENCOUNTER — Ambulatory Visit: Payer: Self-pay | Admitting: Obstetrics & Gynecology

## 2006-08-26 ENCOUNTER — Ambulatory Visit: Payer: Self-pay | Admitting: Obstetrics & Gynecology

## 2006-08-29 ENCOUNTER — Ambulatory Visit: Payer: Self-pay | Admitting: Obstetrics & Gynecology

## 2006-09-02 ENCOUNTER — Ambulatory Visit (HOSPITAL_COMMUNITY): Admission: RE | Admit: 2006-09-02 | Discharge: 2006-09-02 | Payer: Self-pay | Admitting: Obstetrics & Gynecology

## 2006-09-02 IMAGING — US US OB FOLLOW-UP
1 series · 14 of 28 positions shown · non-contrast
Comparison: none

OBSTETRICAL ULTRASOUND:
 This ultrasound was performed in The [HOSPITAL], and the [facility]/GY[facility] report will be stored to [REDACTED] PACS.

[Series 1: us ob follow-up · 14 of 31 slices shown]
[im 2/31]
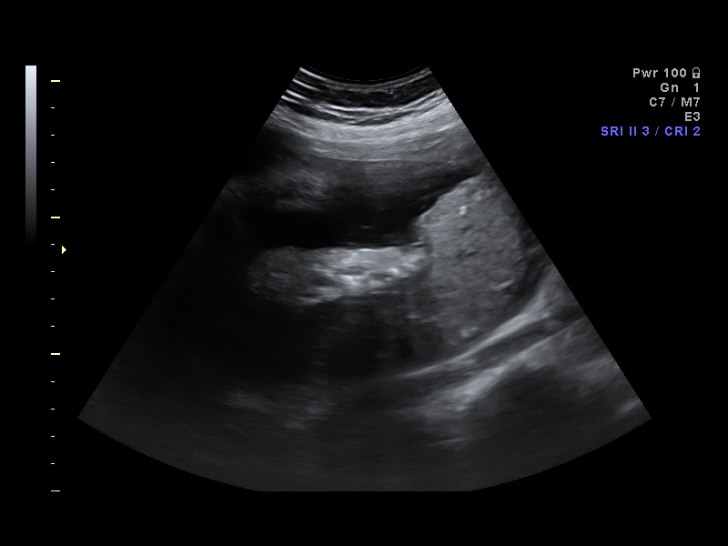
[im 4/31]
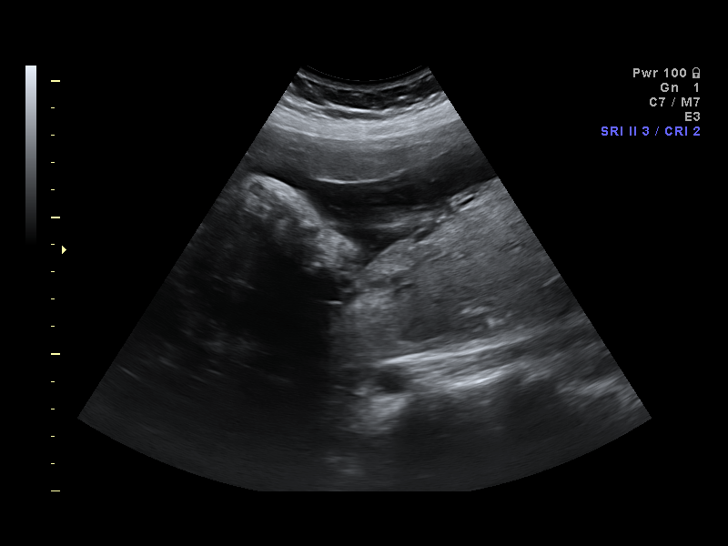
[im 6/31]
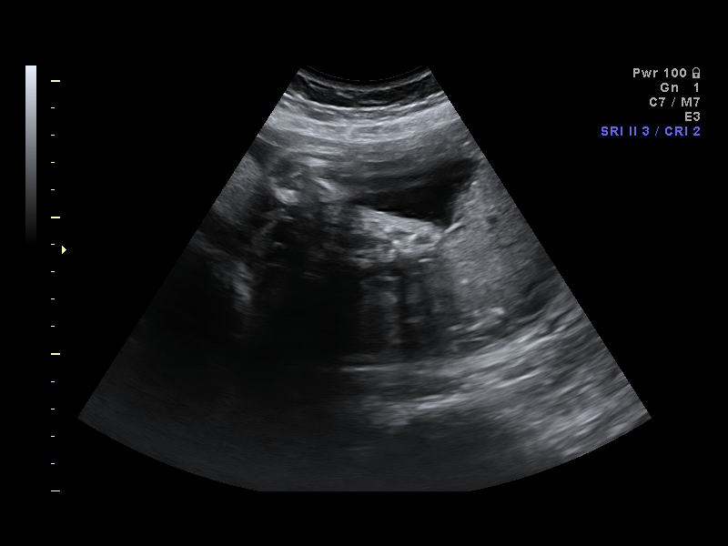
[im 8/31]
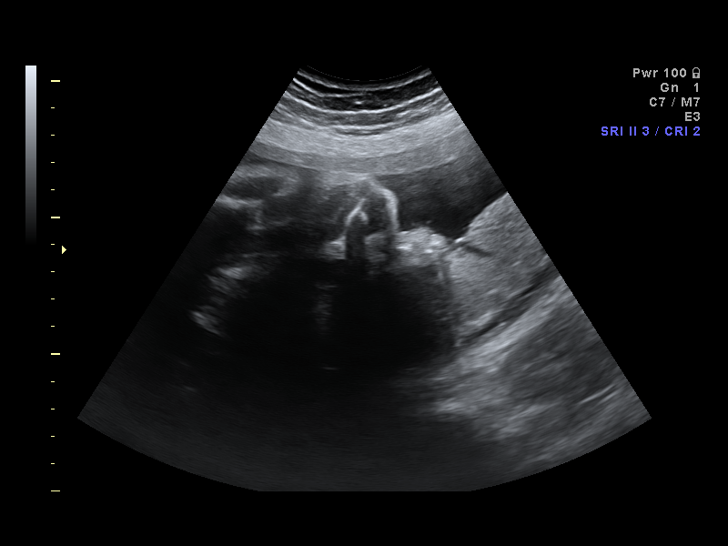
[im 11/31]
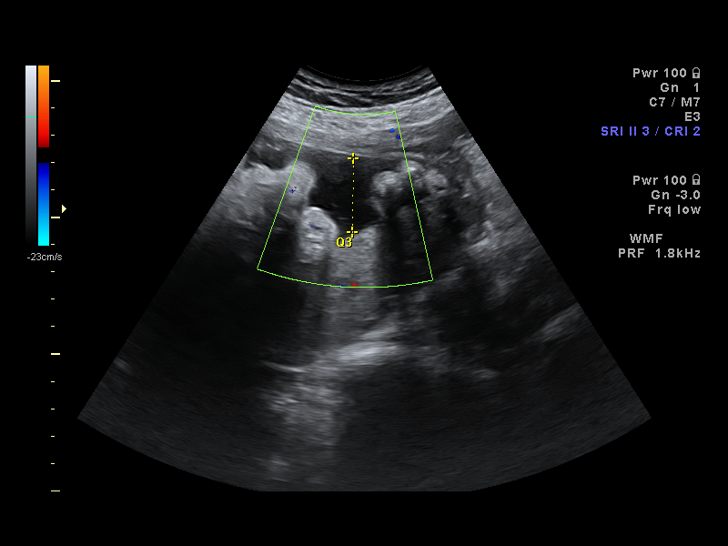
[im 13/31]
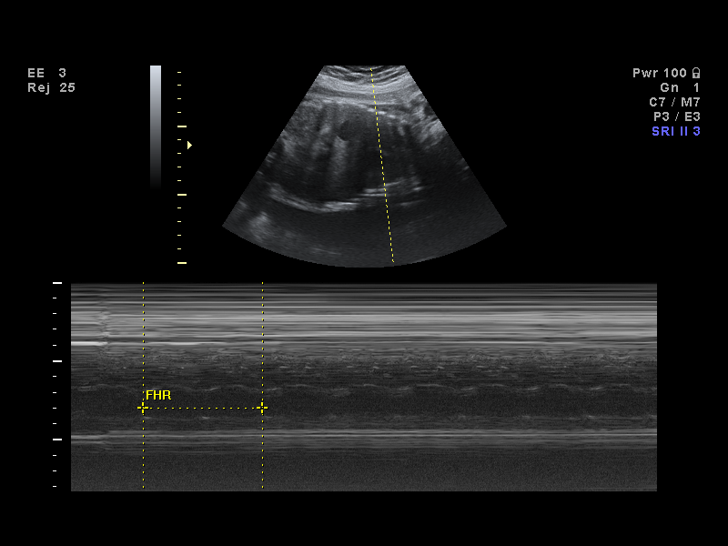
[im 15/31]
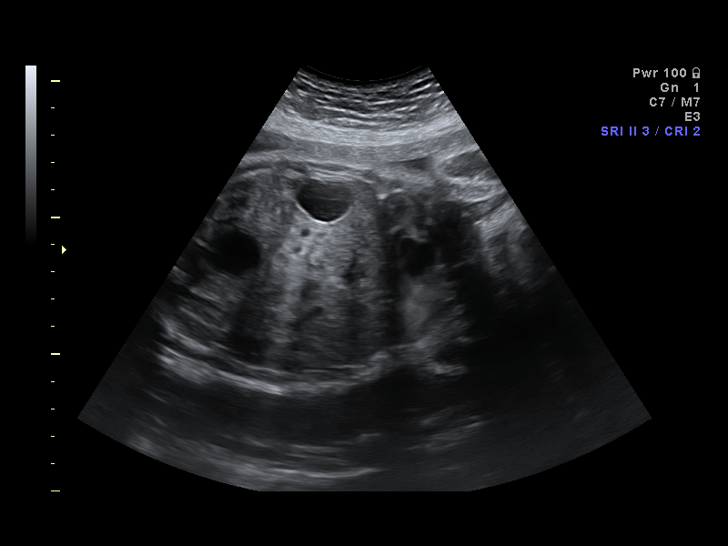
[im 17/31]
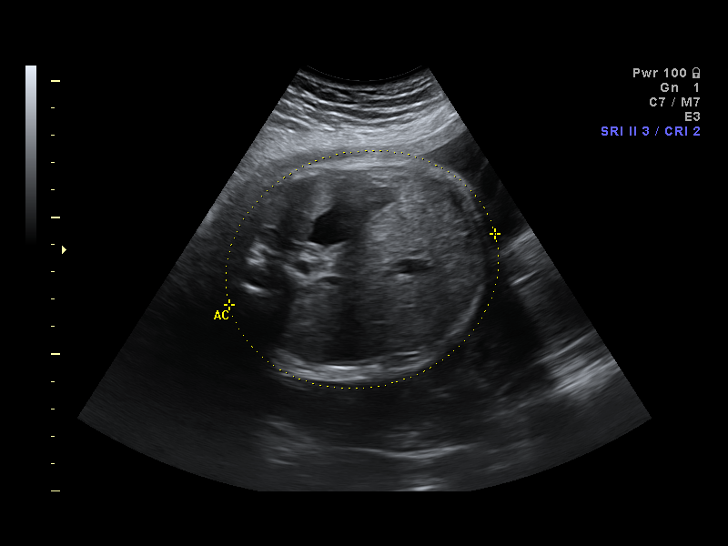
[im 19/31]
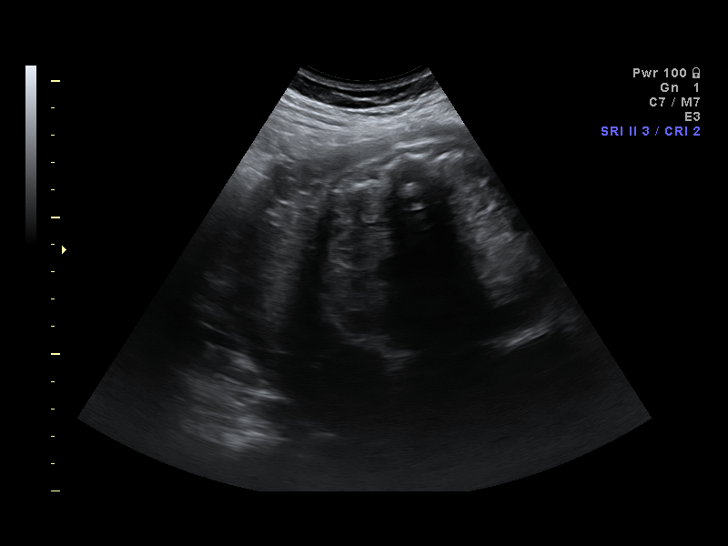
[im 22/31]
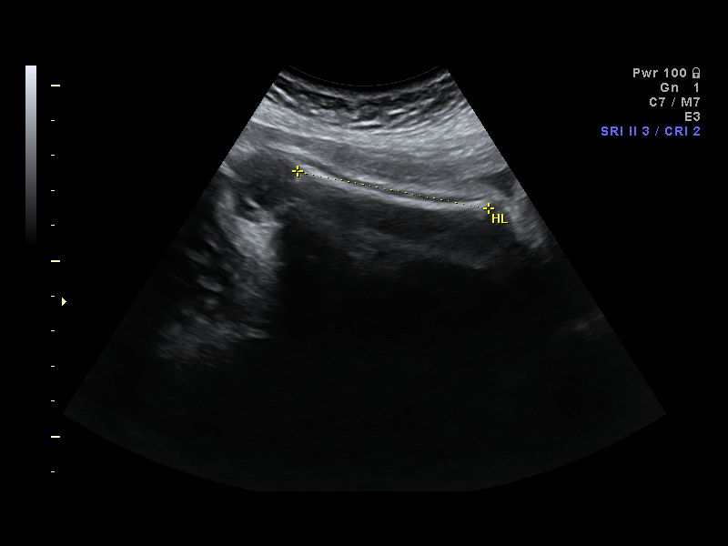
[im 24/31]
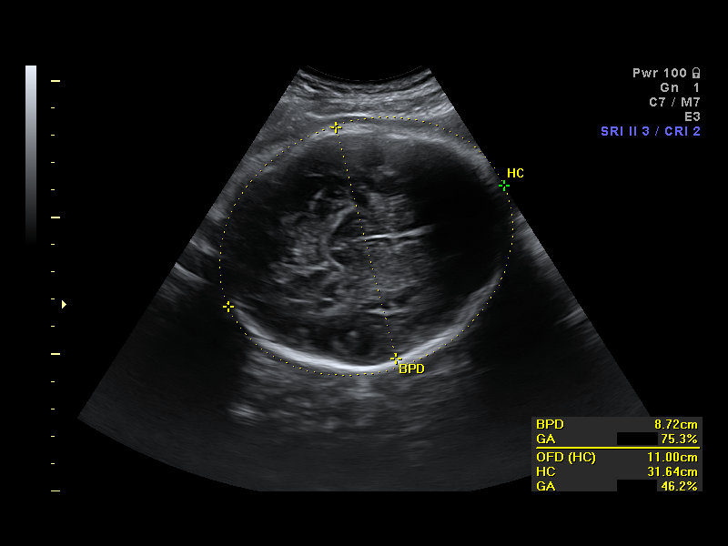
[im 26/31]
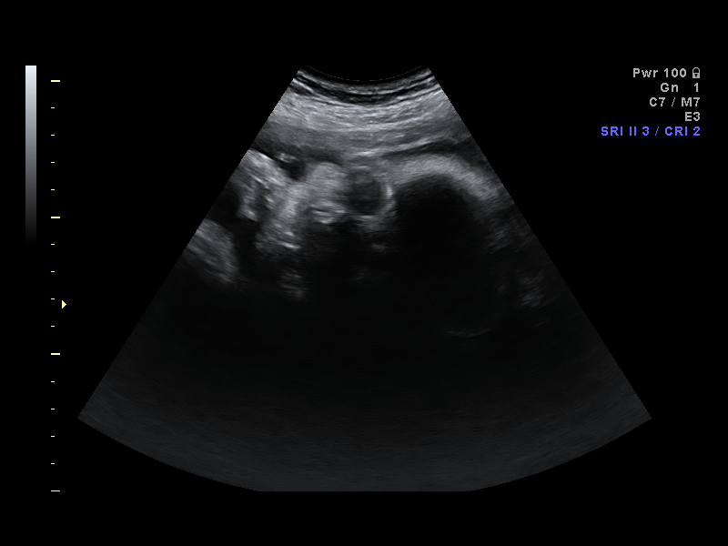
[im 28/31]
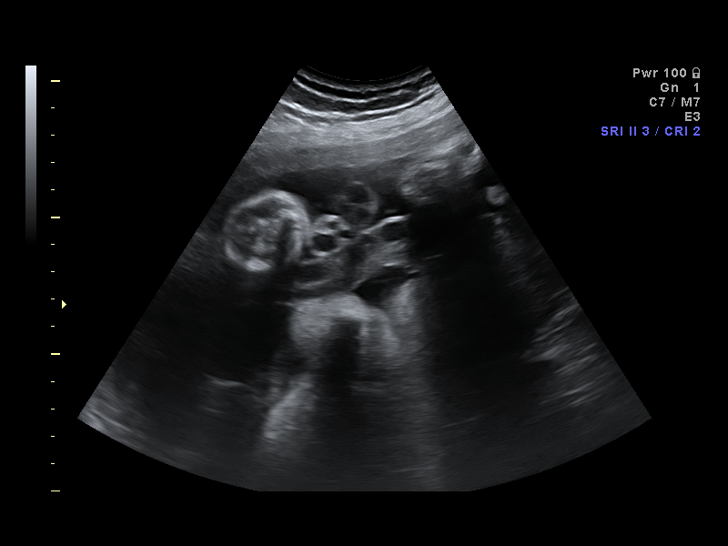
[im 31/31]
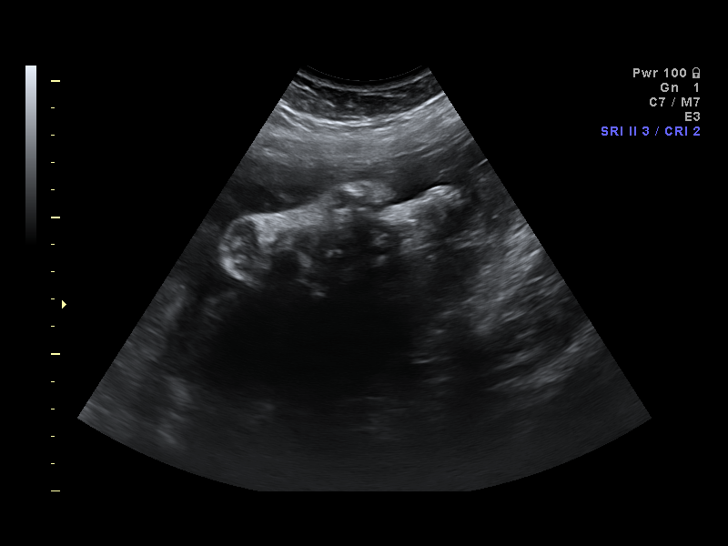

[14 of 28 positions shown; findings below may reference images not displayed]

IMPRESSION: The [facility] report has also been faxed to the ordering physician.

## 2006-09-05 ENCOUNTER — Ambulatory Visit: Payer: Self-pay | Admitting: Obstetrics & Gynecology

## 2006-09-05 ENCOUNTER — Ambulatory Visit (HOSPITAL_COMMUNITY): Admission: RE | Admit: 2006-09-05 | Discharge: 2006-09-05 | Payer: Self-pay | Admitting: Obstetrics & Gynecology

## 2006-09-05 IMAGING — US US FETAL BPP W/O NONSTRESS
1 series · 14 of 20 positions shown · non-contrast
Comparison: none

OBSTETRICAL ULTRASOUND:
 This ultrasound was performed in The [HOSPITAL], and the [facility] report will be stored to [REDACTED] PACS.

[Series 1: us fetal bpp w/o nonstress · 14 of 20 slices shown]
[im 1/20]
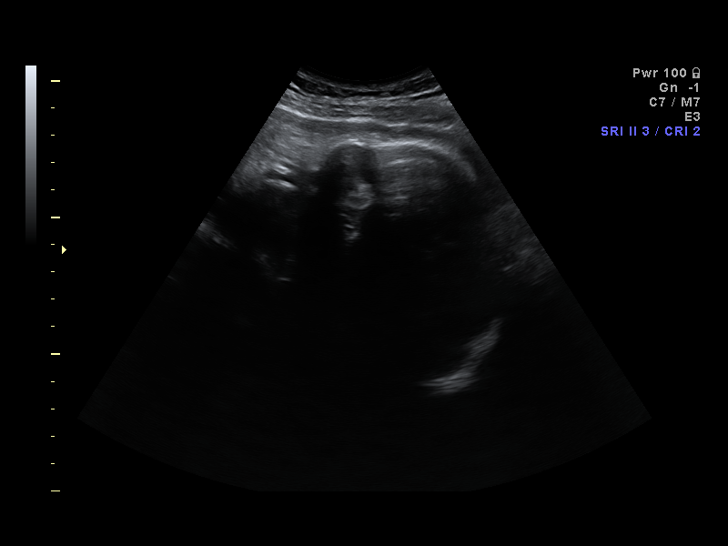
[im 3/20]
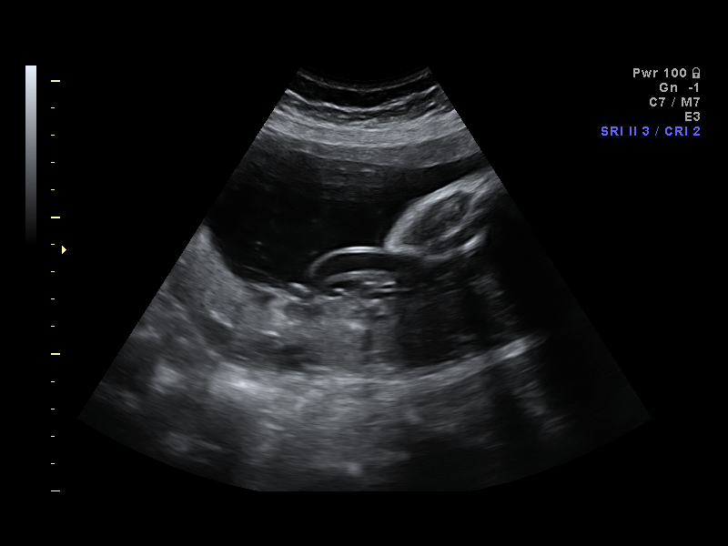
[im 4/20]
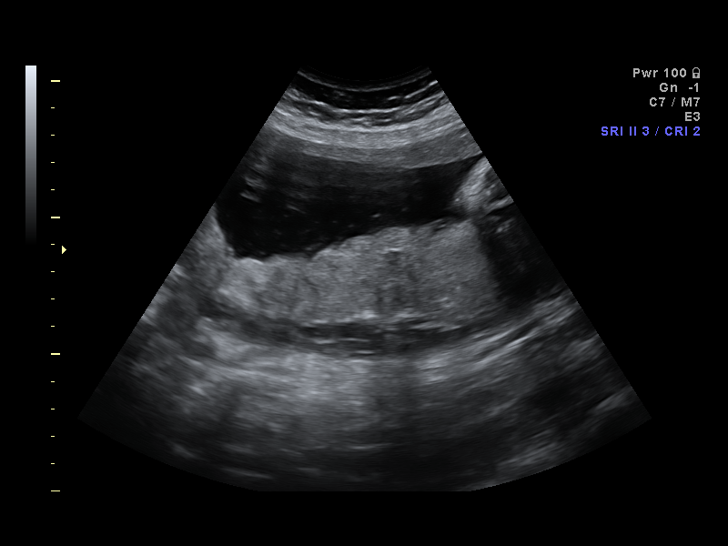
[im 6/20]
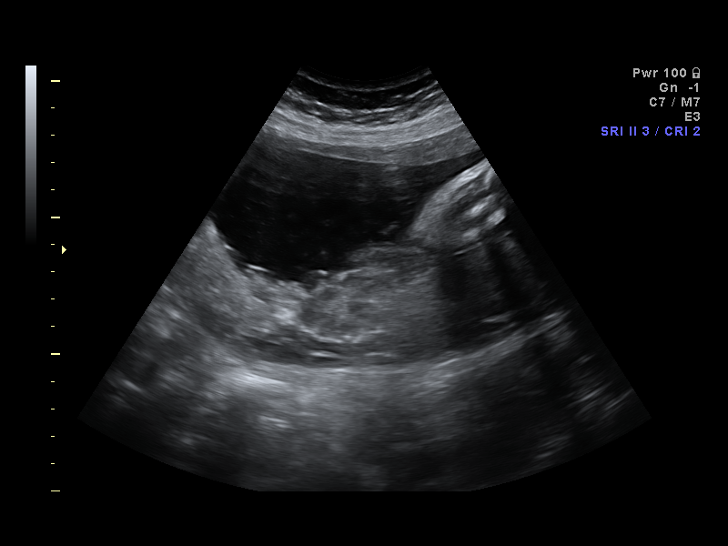
[im 7/20]
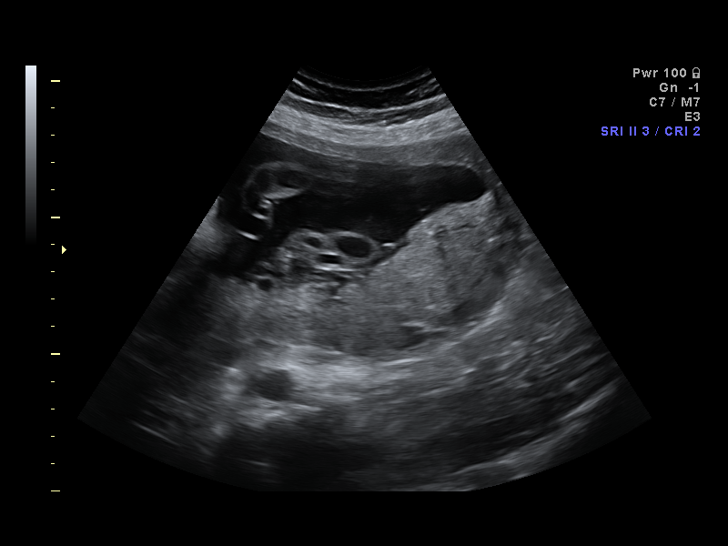
[im 8/20]
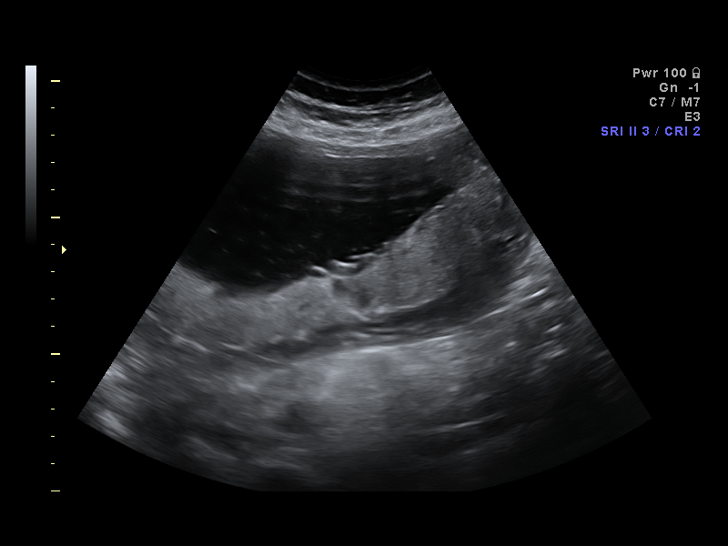
[im 10/20]
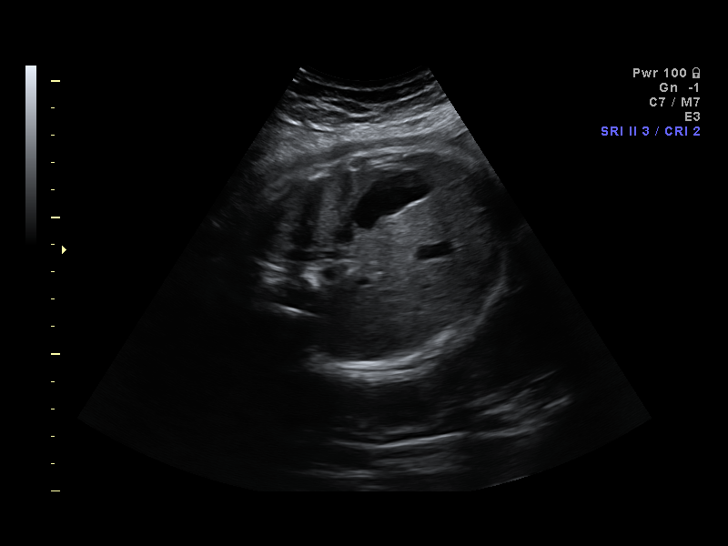
[im 11/20]
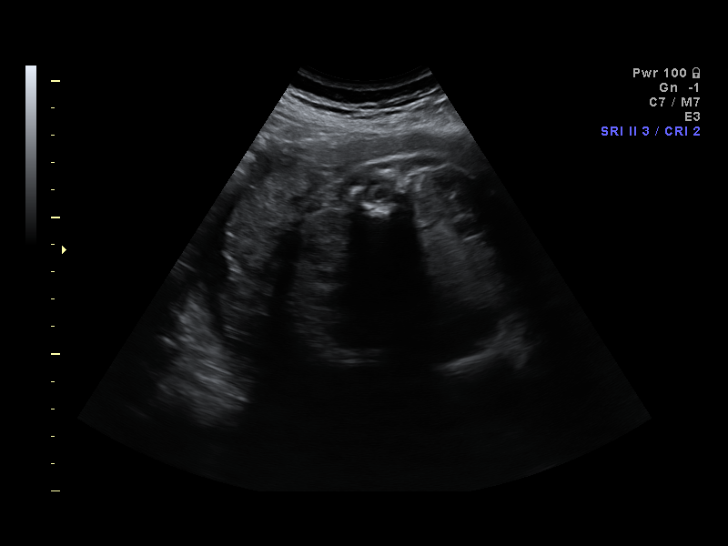
[im 13/20]
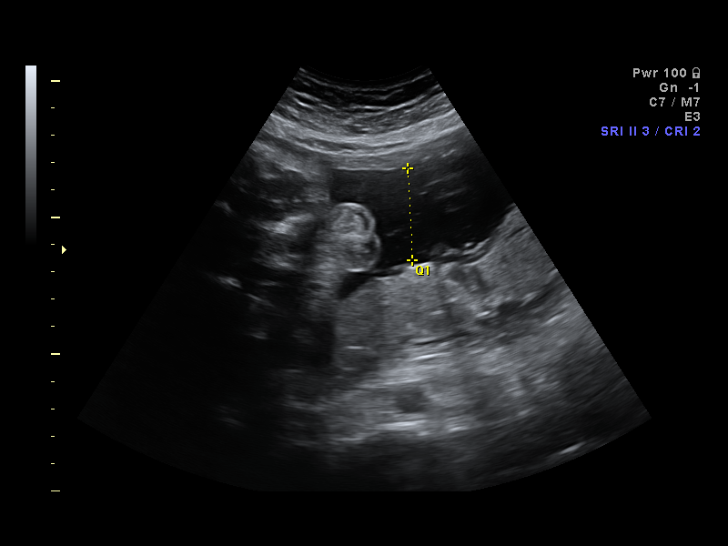
[im 14/20]
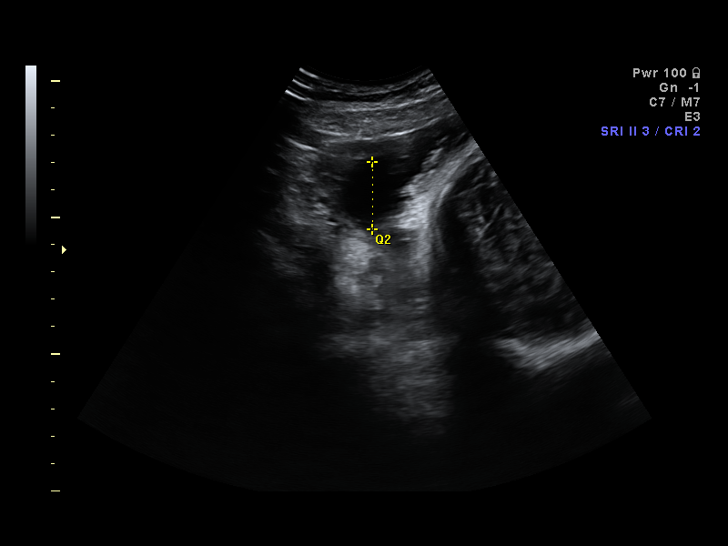
[im 16/20]
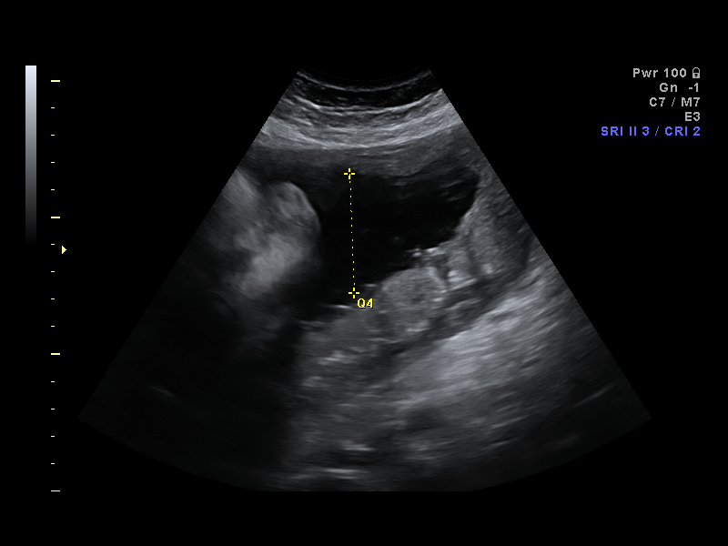
[im 17/20]
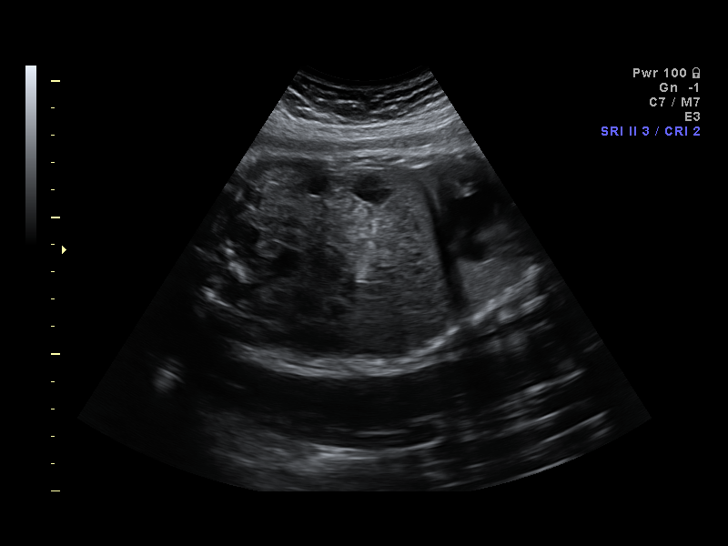
[im 18/20]
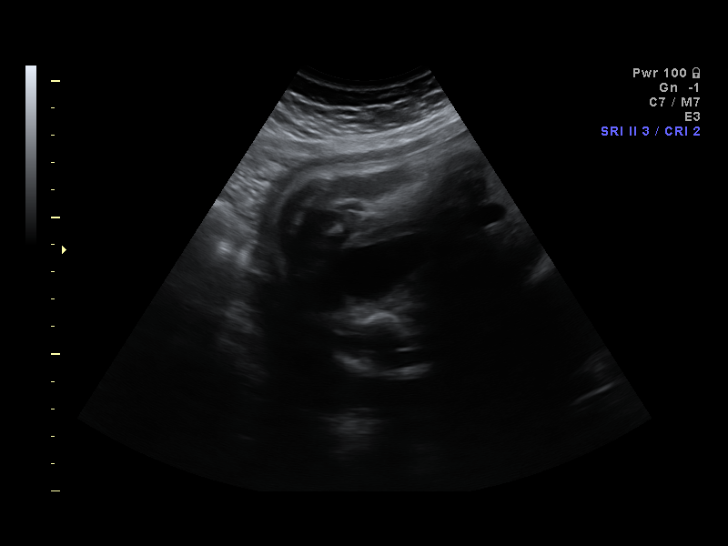
[im 20/20]
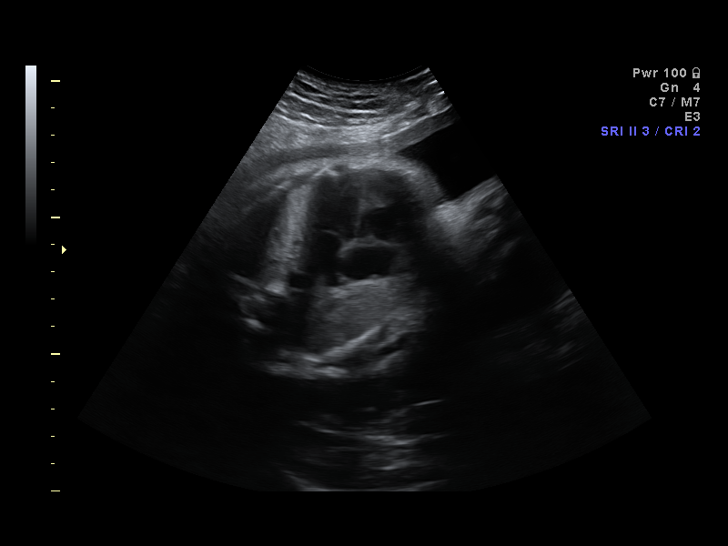

[14 of 20 positions shown; findings below may reference images not displayed]

IMPRESSION: The [facility] report has also been faxed to the ordering physician.

## 2006-09-09 ENCOUNTER — Ambulatory Visit: Payer: Self-pay | Admitting: Obstetrics & Gynecology

## 2006-09-12 ENCOUNTER — Ambulatory Visit: Payer: Self-pay | Admitting: Obstetrics & Gynecology

## 2006-09-12 ENCOUNTER — Ambulatory Visit: Payer: Self-pay | Admitting: Gynecology

## 2006-09-16 ENCOUNTER — Ambulatory Visit: Payer: Self-pay | Admitting: Gynecology

## 2006-09-18 ENCOUNTER — Ambulatory Visit (HOSPITAL_COMMUNITY): Admission: RE | Admit: 2006-09-18 | Discharge: 2006-09-18 | Payer: Self-pay | Admitting: Obstetrics & Gynecology

## 2006-09-18 IMAGING — US US OB FOLLOW-UP
1 series · 14 of 23 positions shown · non-contrast
Comparison: none

OBSTETRICAL ULTRASOUND:
 This ultrasound was performed in The [HOSPITAL], and the [facility]/GY[facility] report will be stored to [REDACTED] PACS.

[Series 1: us ob follow-up · 14 of 23 slices shown]
[im 1/23]
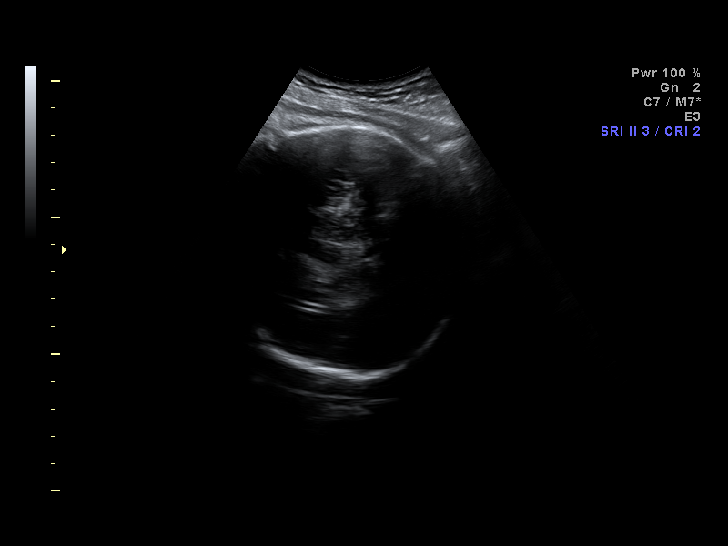
[im 3/23]
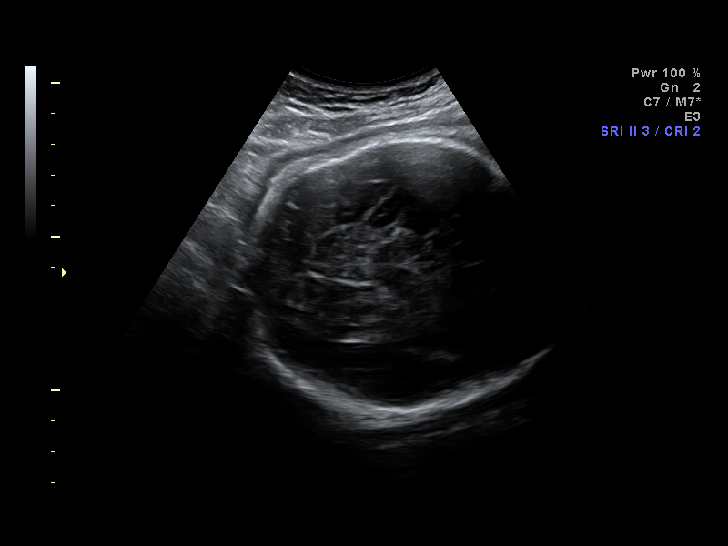
[im 5/23]
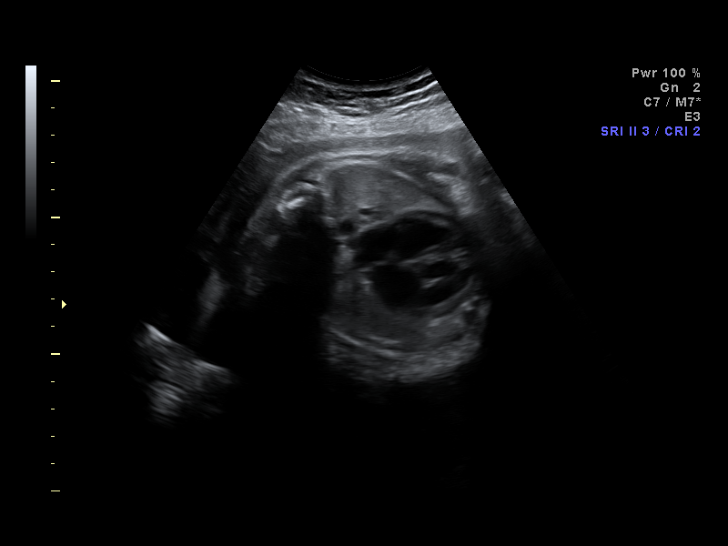
[im 6/23]
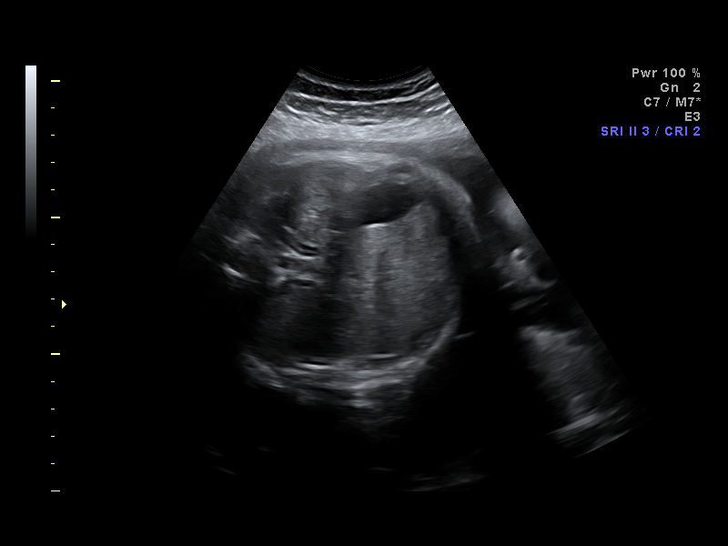
[im 8/23]
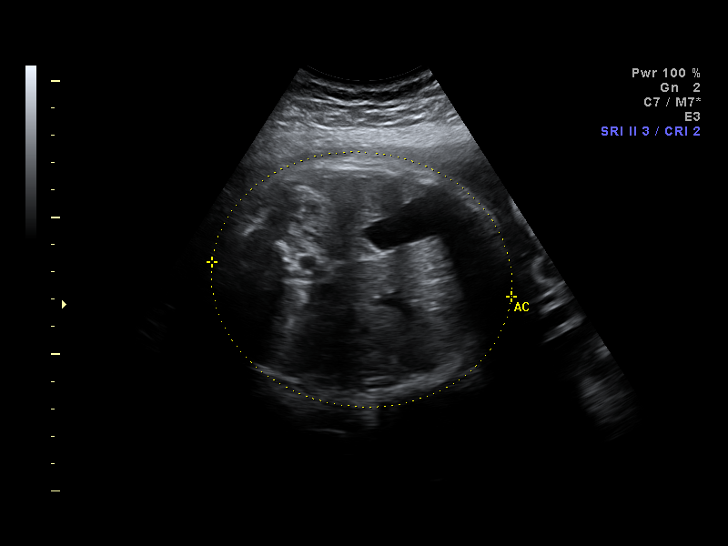
[im 10/23]
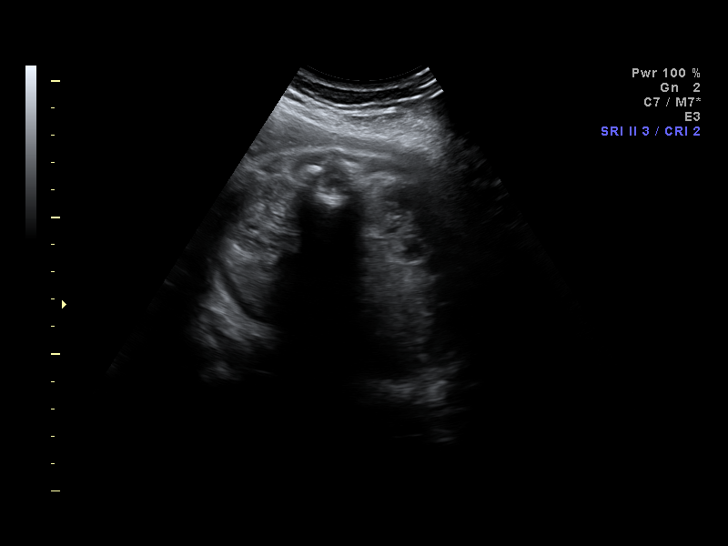
[im 11/23]
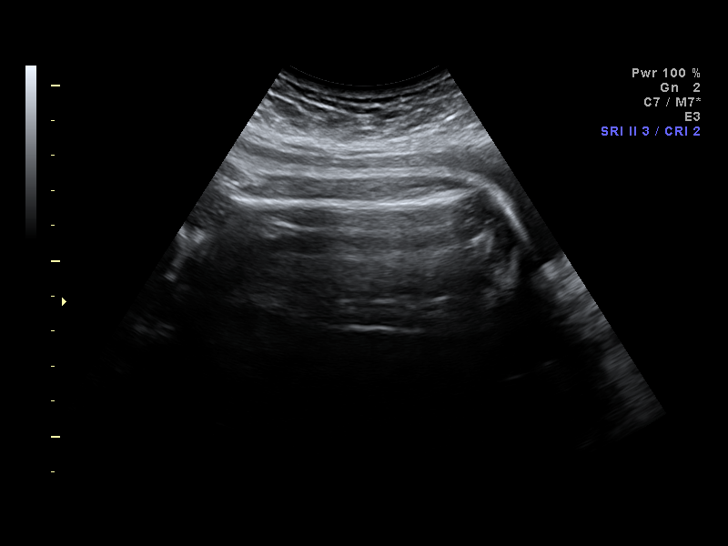
[im 13/23]
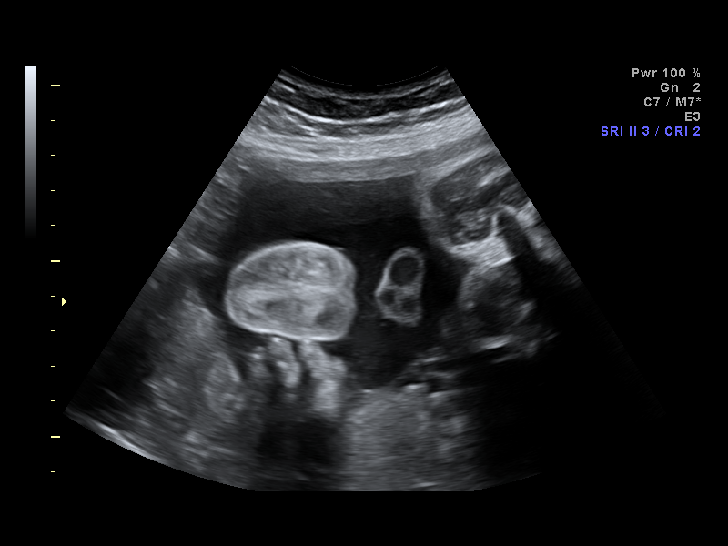
[im 14/23]
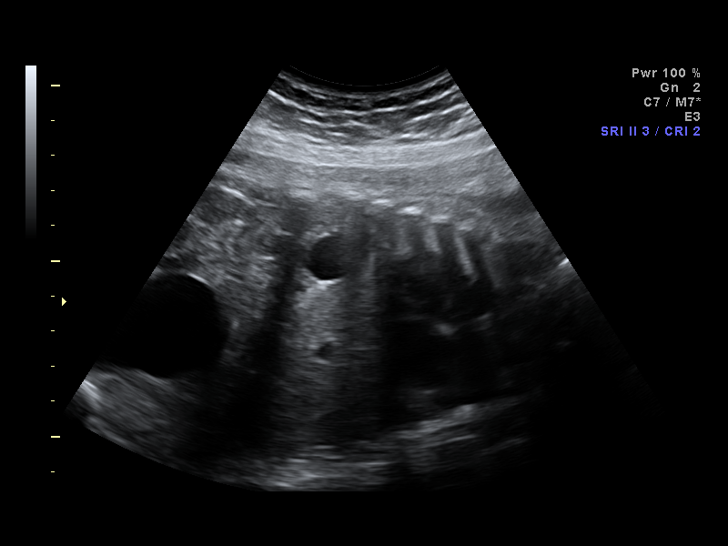
[im 16/23]
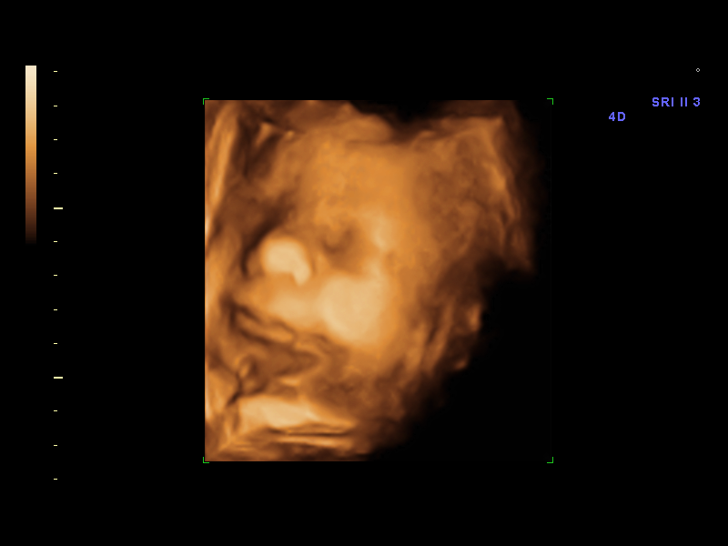
[im 18/23]
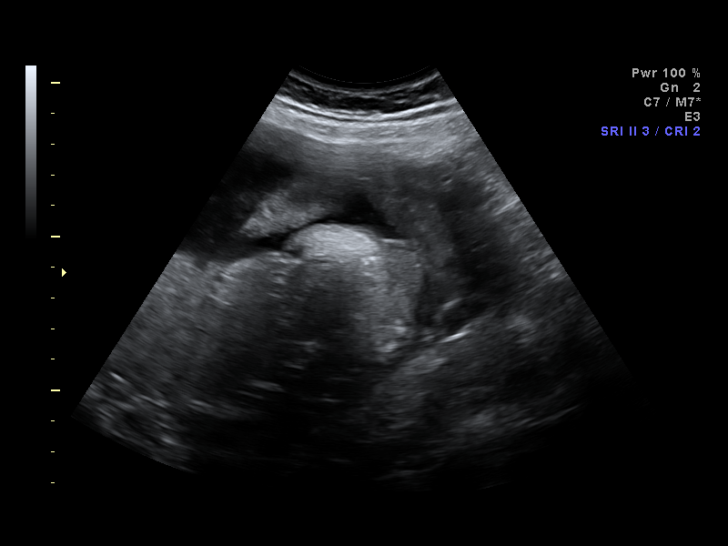
[im 19/23]
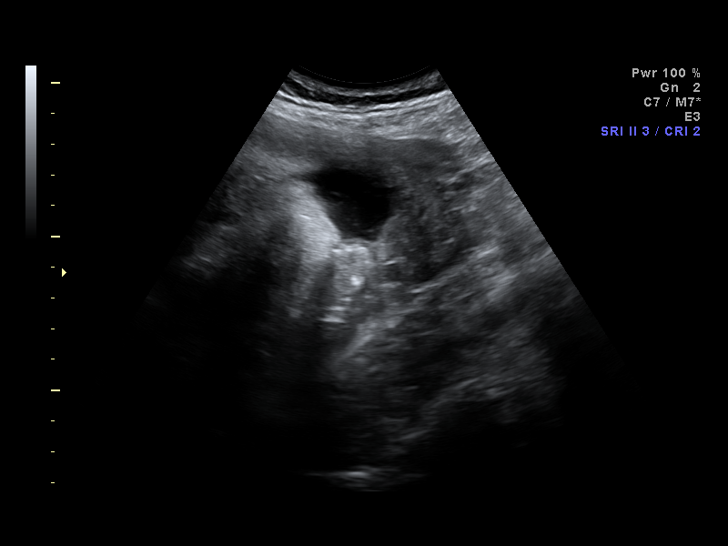
[im 21/23]
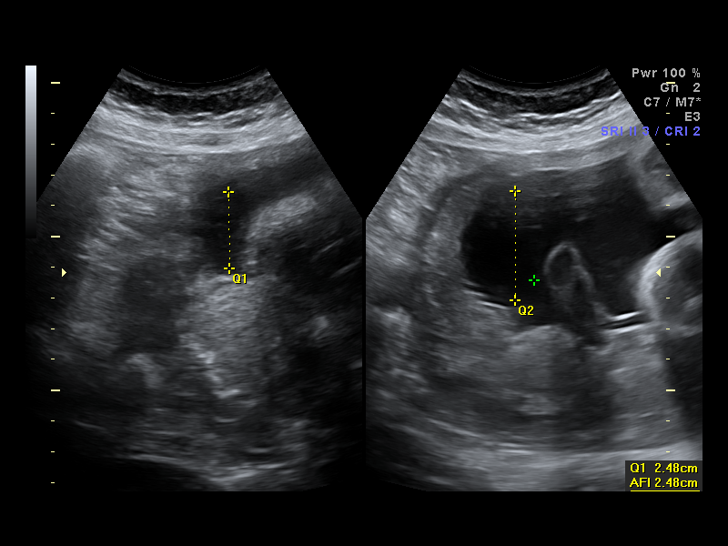
[im 23/23]
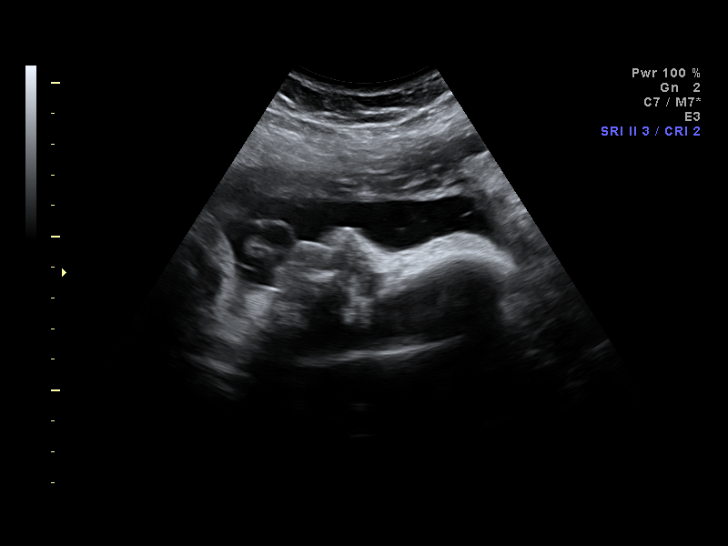

[14 of 23 positions shown; findings below may reference images not displayed]

IMPRESSION: The [facility] report has also been faxed to the ordering physician.

## 2006-09-19 ENCOUNTER — Ambulatory Visit: Payer: Self-pay | Admitting: Obstetrics & Gynecology

## 2006-09-23 ENCOUNTER — Ambulatory Visit: Payer: Self-pay | Admitting: Obstetrics & Gynecology

## 2006-09-25 ENCOUNTER — Ambulatory Visit (HOSPITAL_COMMUNITY): Admission: RE | Admit: 2006-09-25 | Discharge: 2006-09-25 | Payer: Self-pay | Admitting: Family Medicine

## 2006-09-25 IMAGING — US US FETAL BPP W/O NONSTRESS
1 series · 14 of 14 positions shown · non-contrast
Comparison: none

OBSTETRICAL ULTRASOUND:

 This ultrasound exam was performed in the [HOSPITAL] Ultrasound Department.  The OB US report was generated in the AS system, and faxed to the ordering physician.  This report is also available in [REDACTED] PACS.

[Series 1: us fetal bpp w/o nonstress · 0.28mm/px · 14 acquisitions, 14 frames shown]
[im 1/14]
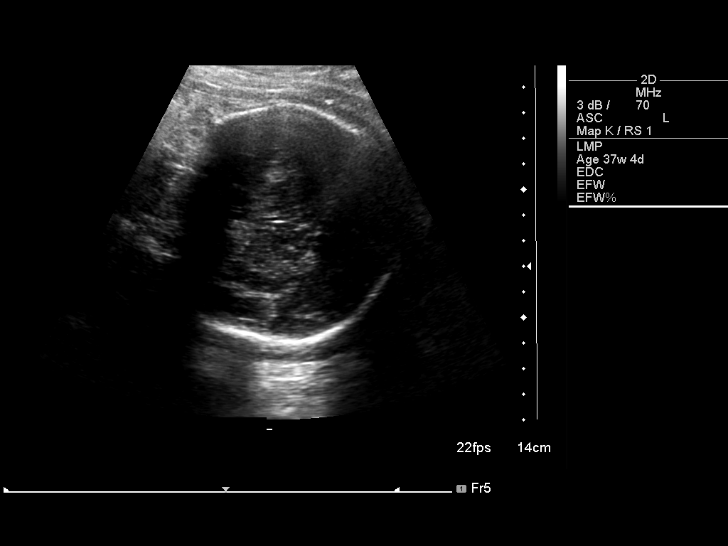
[im 2/14]
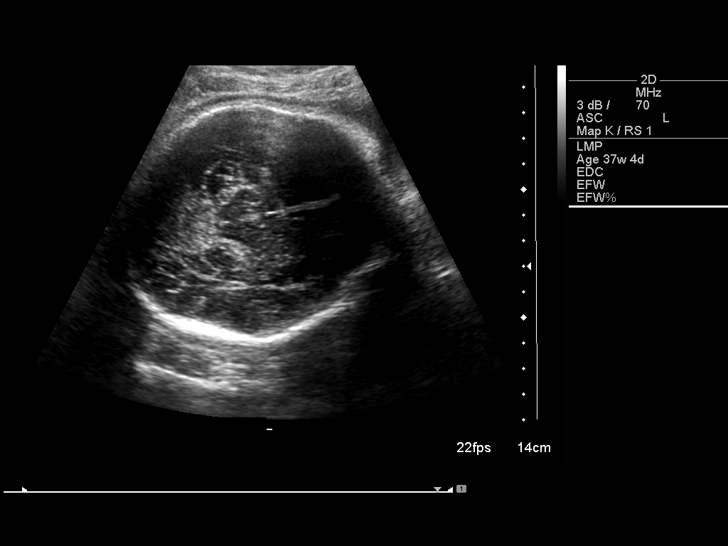
[im 3/14]
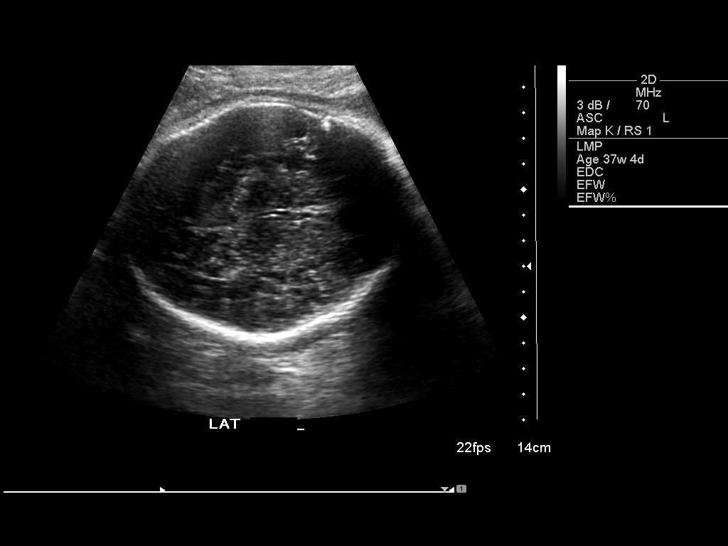
[im 4/14]
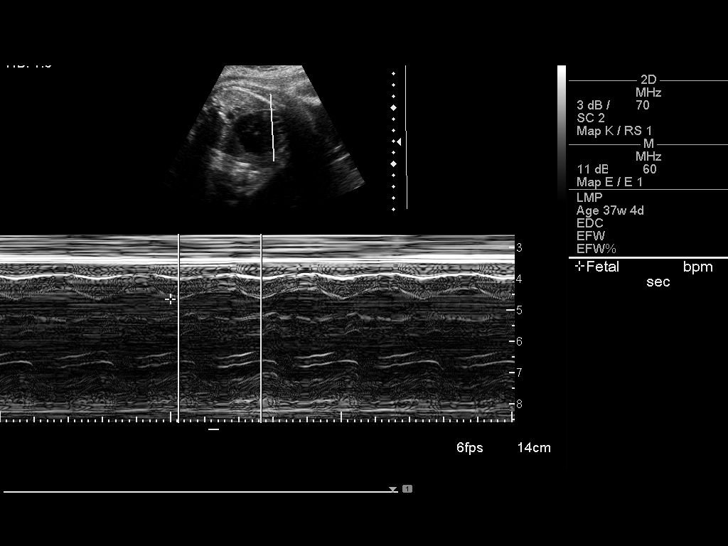
[im 5/14]
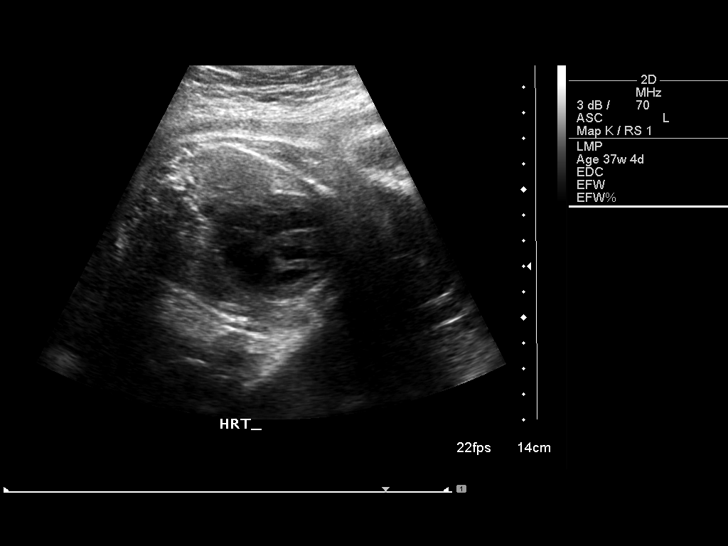
[im 6/14]
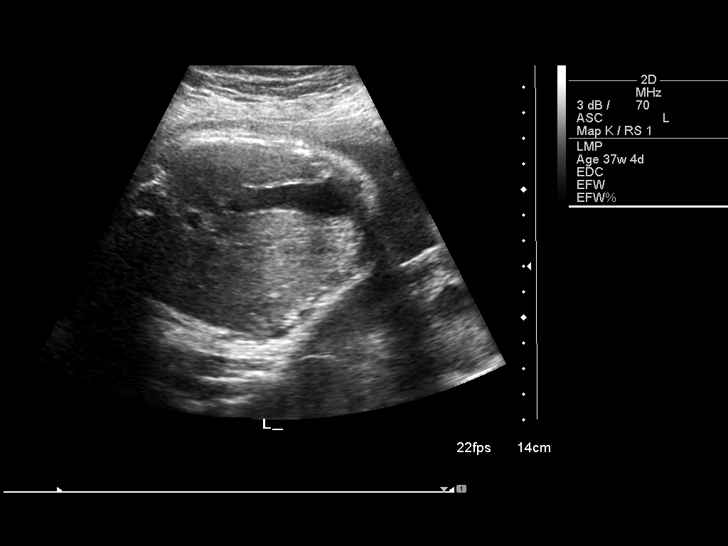
[im 7/14]
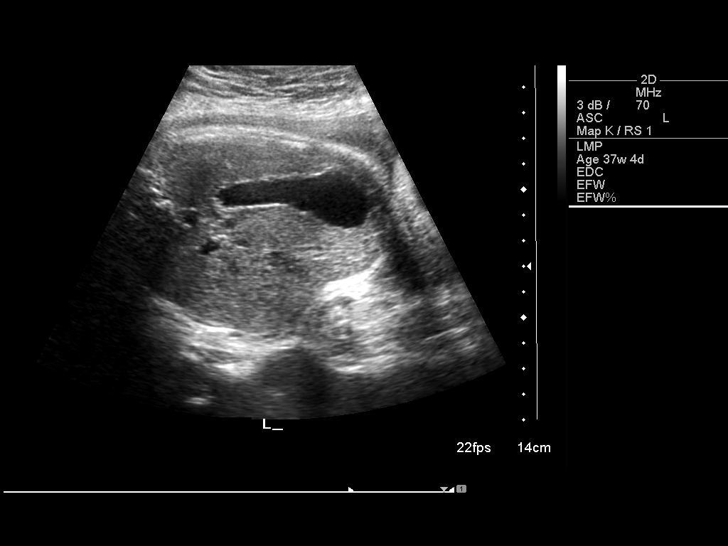
[im 8/14]
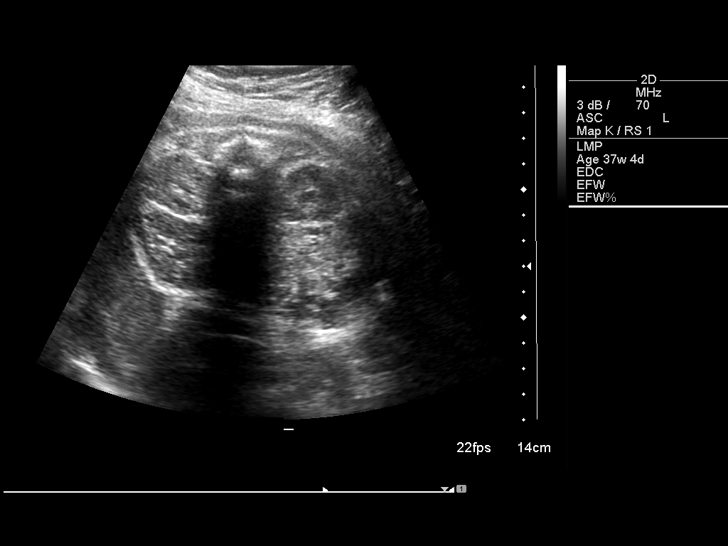
[im 9/14]
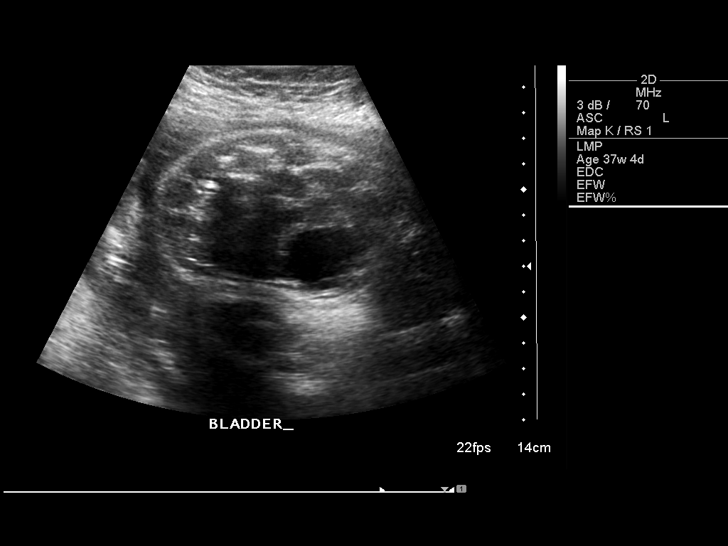
[im 10/14]
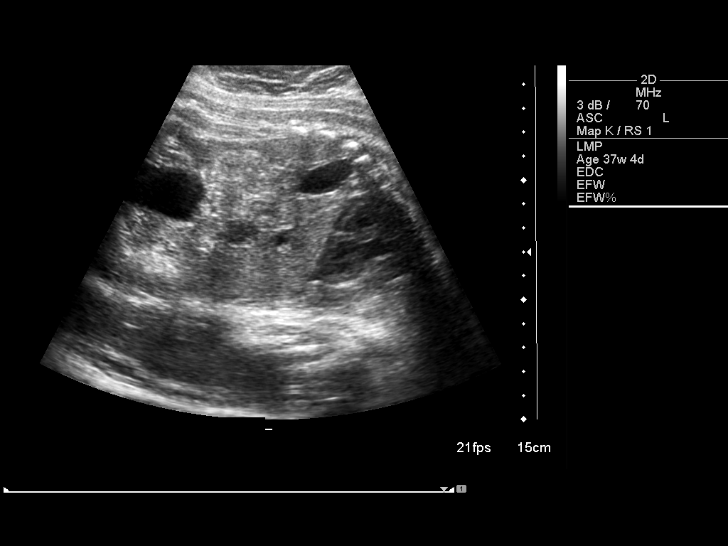
[im 11/14]
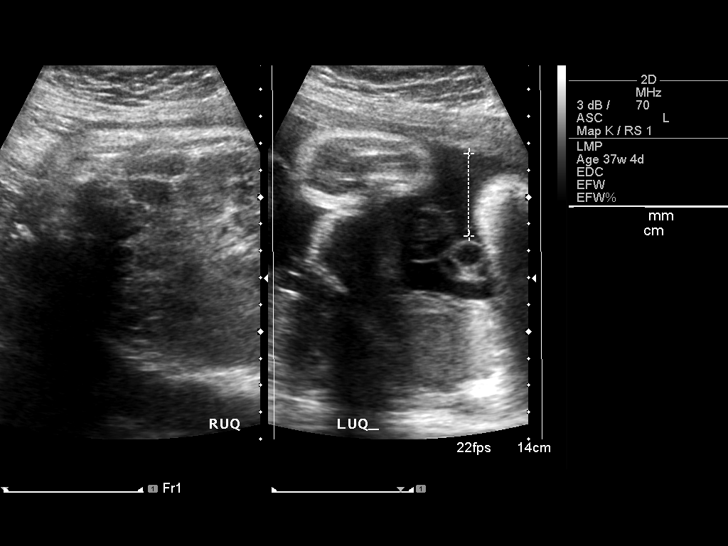
[im 12/14]
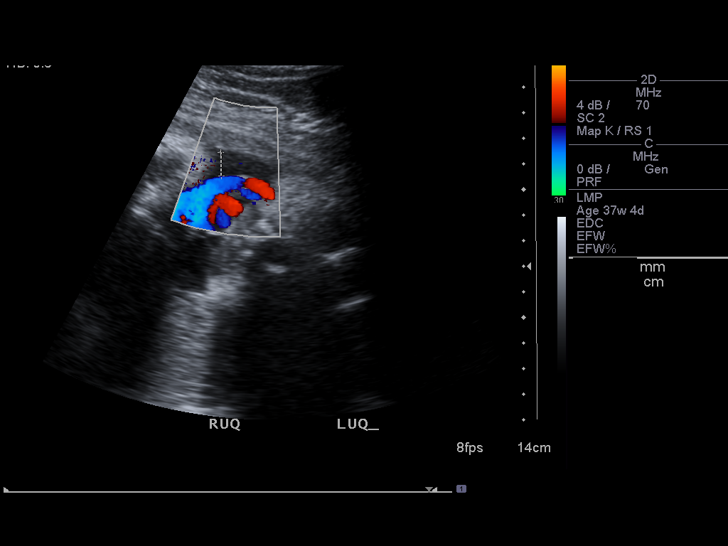
[im 13/14]
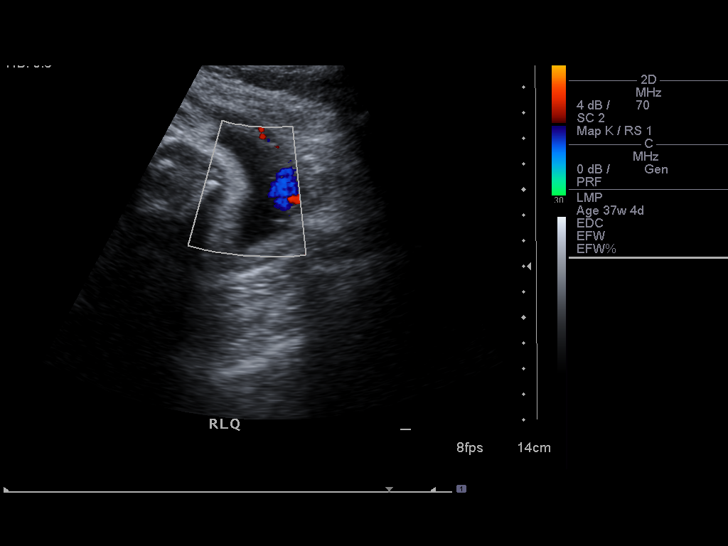
[im 14/14]
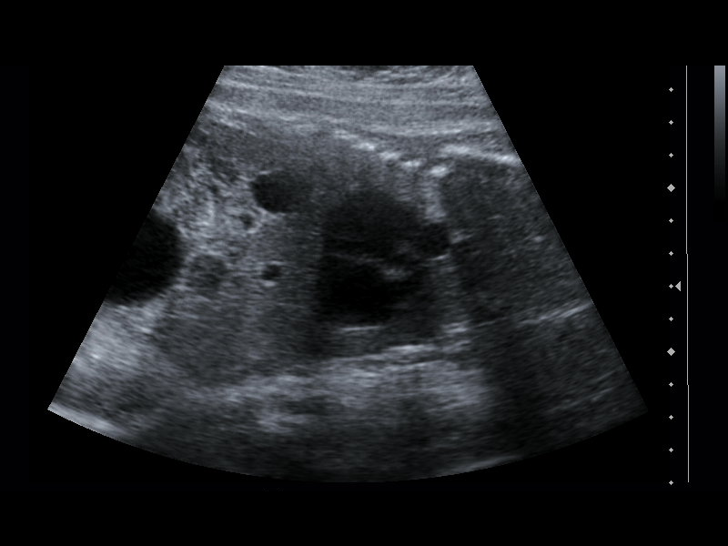

[14 of 14 positions shown; findings below may reference images not displayed]

IMPRESSION: See [facility] Obstetric US report.

## 2006-09-26 ENCOUNTER — Ambulatory Visit: Payer: Self-pay | Admitting: Obstetrics & Gynecology

## 2006-09-30 ENCOUNTER — Inpatient Hospital Stay (HOSPITAL_COMMUNITY): Admission: AD | Admit: 2006-09-30 | Discharge: 2006-09-30 | Payer: Self-pay | Admitting: Obstetrics and Gynecology

## 2006-09-30 ENCOUNTER — Ambulatory Visit: Payer: Self-pay | Admitting: *Deleted

## 2006-10-01 ENCOUNTER — Ambulatory Visit: Payer: Self-pay | Admitting: Family Medicine

## 2006-10-01 ENCOUNTER — Ambulatory Visit (HOSPITAL_COMMUNITY): Admission: RE | Admit: 2006-10-01 | Discharge: 2006-10-01 | Payer: Self-pay | Admitting: Obstetrics & Gynecology

## 2006-10-01 IMAGING — US US FETAL BPP W/O NONSTRESS
1 series · 14 of 28 positions shown · non-contrast
Comparison: none

OBSTETRICAL ULTRASOUND:
 This ultrasound was performed in The [HOSPITAL], and the [facility]GY[facility] report will be stored to [REDACTED] PACS.

[Series 1: us fetal bpp w/o nonstress · 14 of 29 slices shown]
[im 2/29]
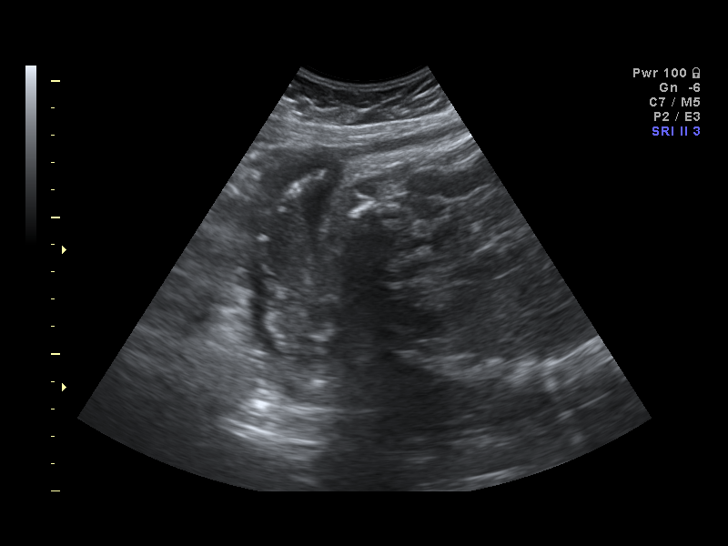
[im 4/29]
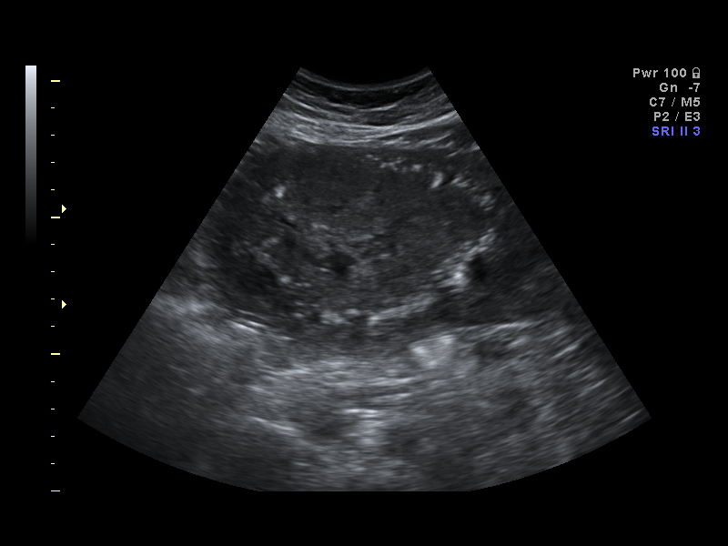
[im 6/29]
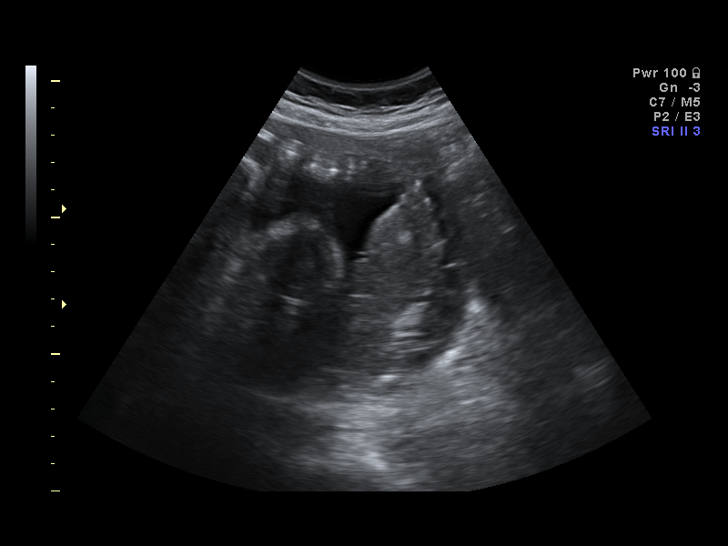
[im 8/29]
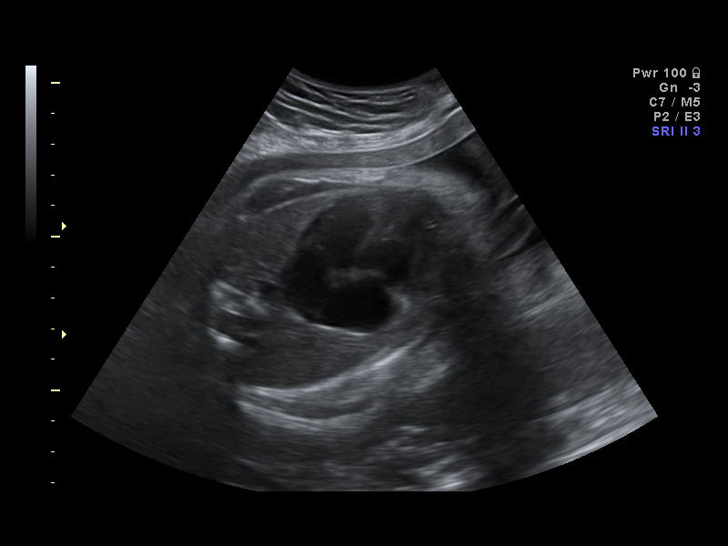
[im 10/29]
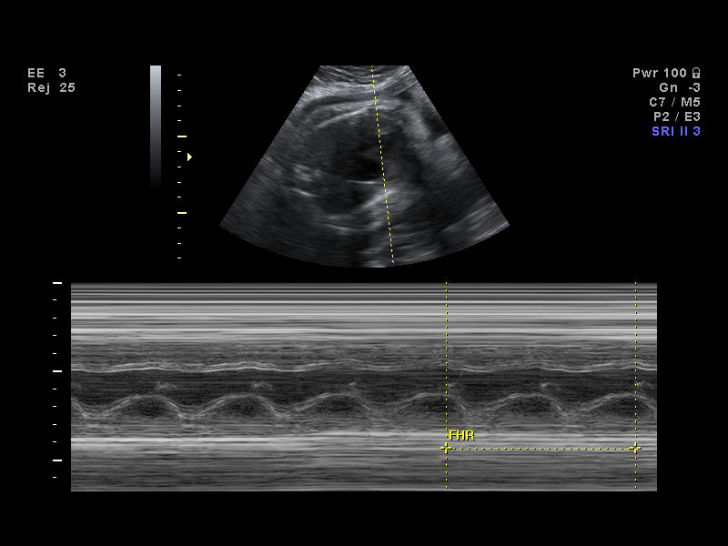
[im 12/29]
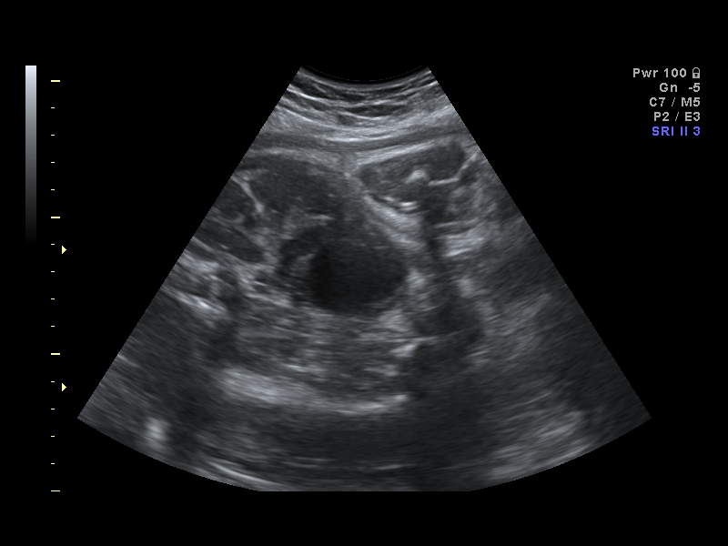
[im 14/29]
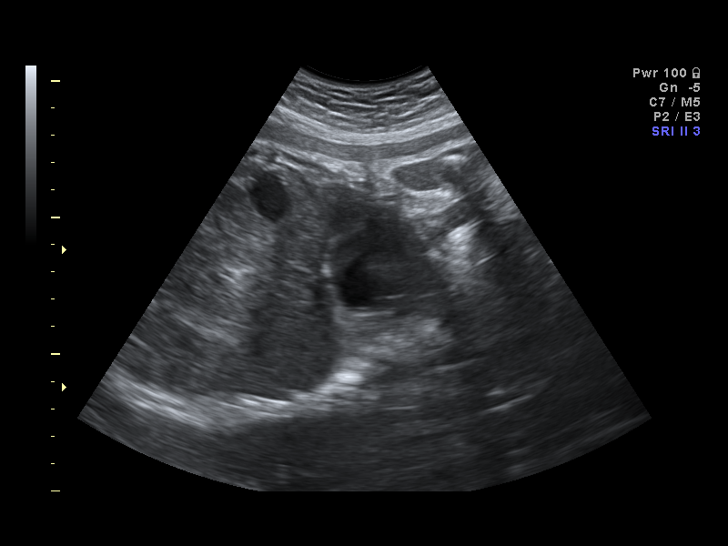
[im 16/29]
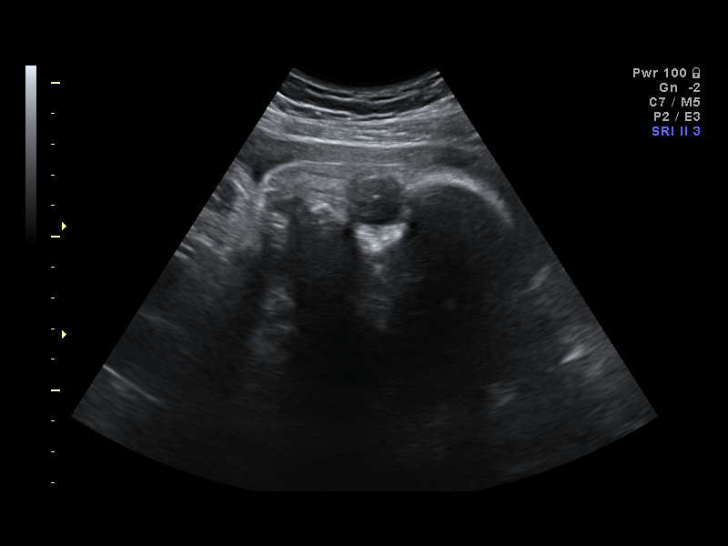
[im 18/29]
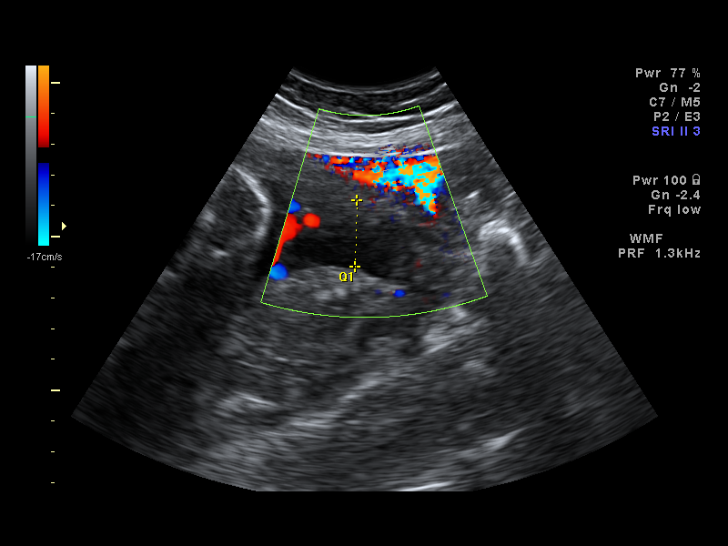
[im 20/29]
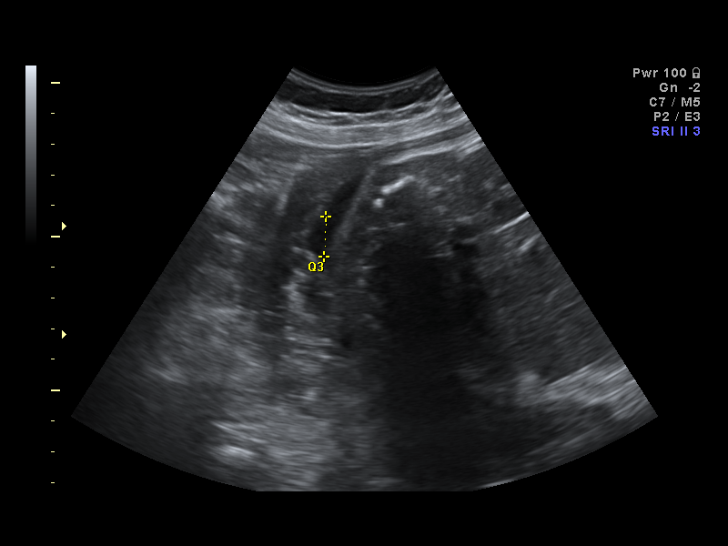
[im 22/29]
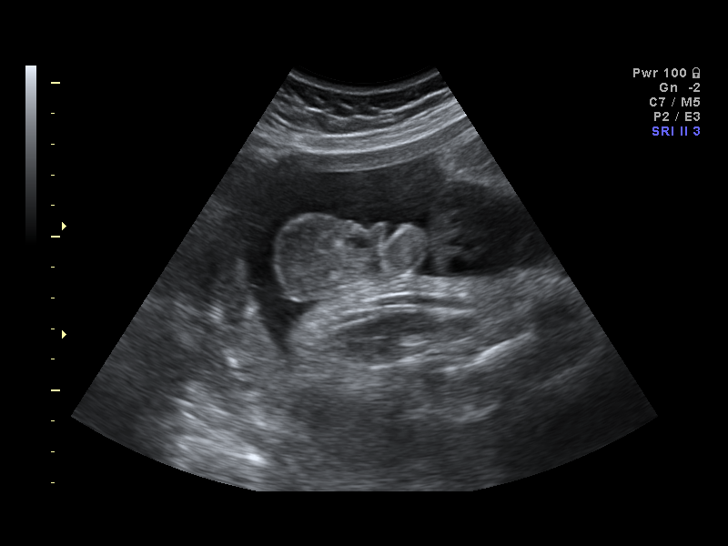
[im 24/29]
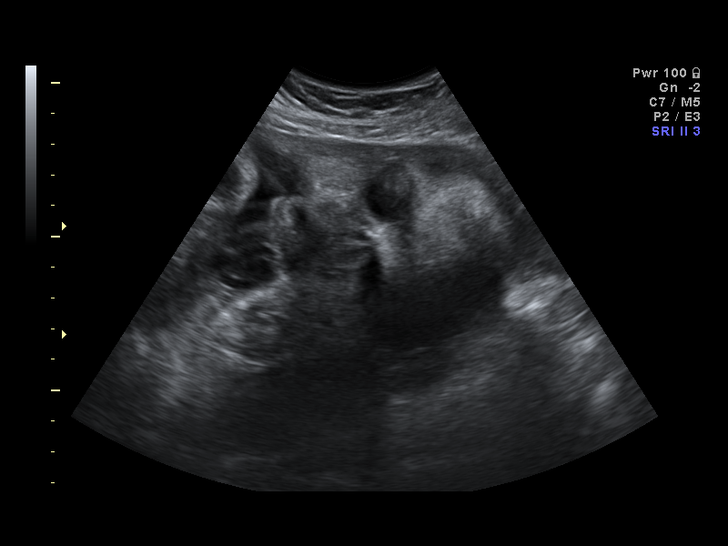
[im 26/29]
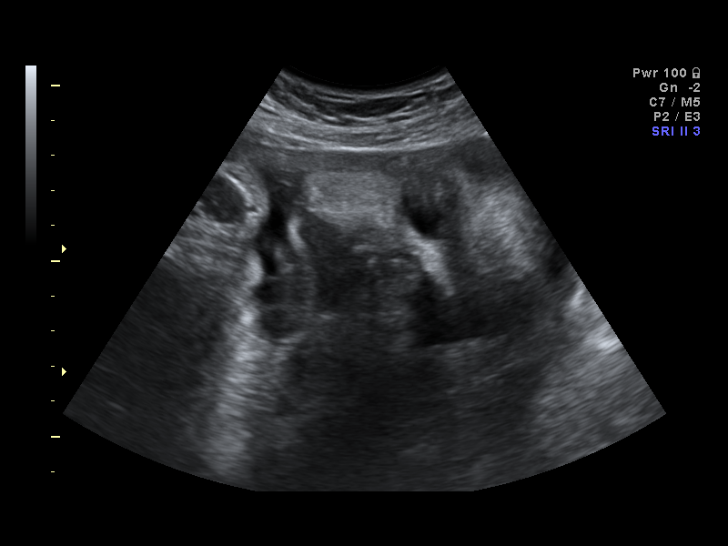
[im 29/29]
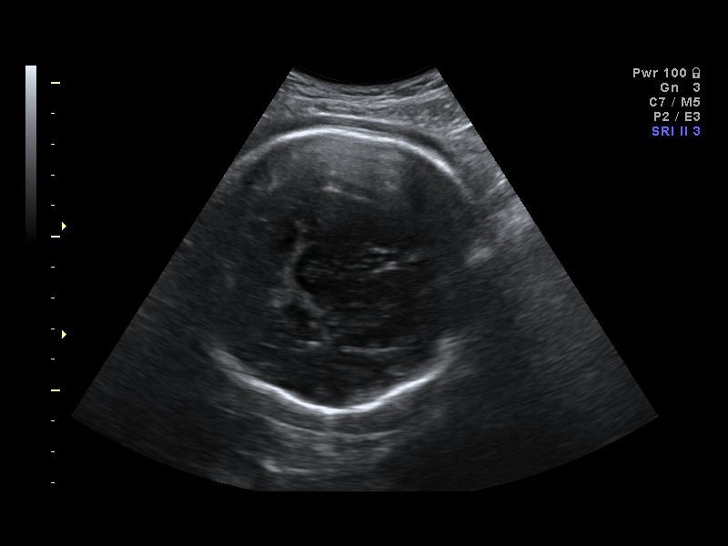

[14 of 28 positions shown; findings below may reference images not displayed]

IMPRESSION: The [facility] report has also been faxed to the ordering physician.

## 2006-10-04 ENCOUNTER — Ambulatory Visit (HOSPITAL_COMMUNITY): Admission: RE | Admit: 2006-10-04 | Discharge: 2006-10-04 | Payer: Self-pay | Admitting: Obstetrics & Gynecology

## 2006-10-05 ENCOUNTER — Inpatient Hospital Stay (HOSPITAL_COMMUNITY): Admission: AD | Admit: 2006-10-05 | Discharge: 2006-10-09 | Payer: Self-pay | Admitting: Obstetrics and Gynecology

## 2006-10-05 ENCOUNTER — Ambulatory Visit: Payer: Self-pay | Admitting: Family Medicine

## 2006-10-31 ENCOUNTER — Ambulatory Visit: Payer: Self-pay | Admitting: Obstetrics & Gynecology

## 2006-11-14 ENCOUNTER — Ambulatory Visit: Payer: Self-pay | Admitting: Obstetrics & Gynecology

## 2006-11-27 ENCOUNTER — Ambulatory Visit: Payer: Self-pay | Admitting: Obstetrics & Gynecology

## 2006-12-26 ENCOUNTER — Ambulatory Visit: Payer: Self-pay | Admitting: Obstetrics & Gynecology

## 2007-02-06 ENCOUNTER — Emergency Department: Payer: Self-pay | Admitting: Internal Medicine

## 2007-02-06 IMAGING — CT CT HEAD WITHOUT CONTRAST
2 series · 16 of 30 positions shown, 20 images · non-contrast
Comparison: none

REASON FOR EXAM: syncope
COMMENTS:

[Series 4: without · axial · non-contrast · 0.45mm/px · z∈[-194,-64]mm · 13 of 32 slices shown, 17 images]
[im 3/32  brain]
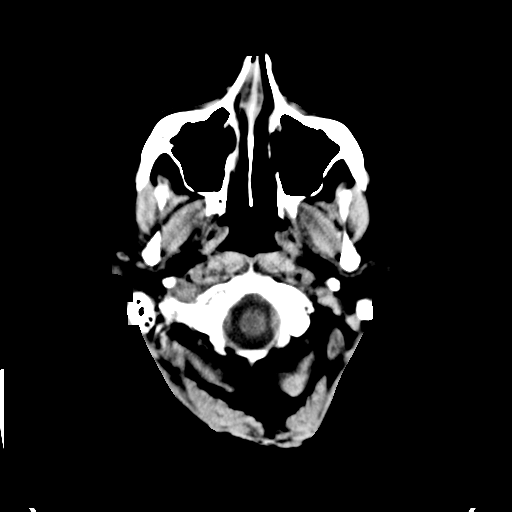
[im 3/32  bone]
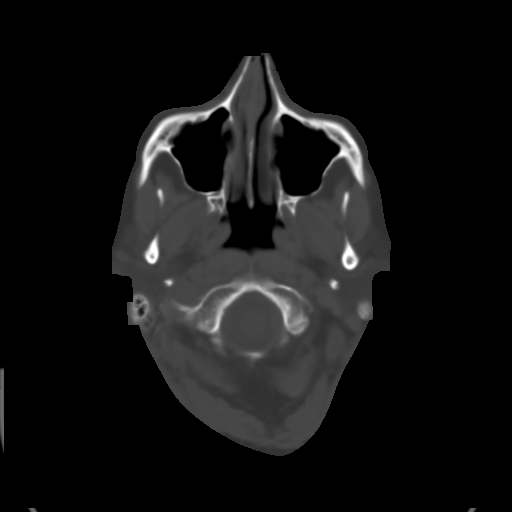
[im 5/32  brain]
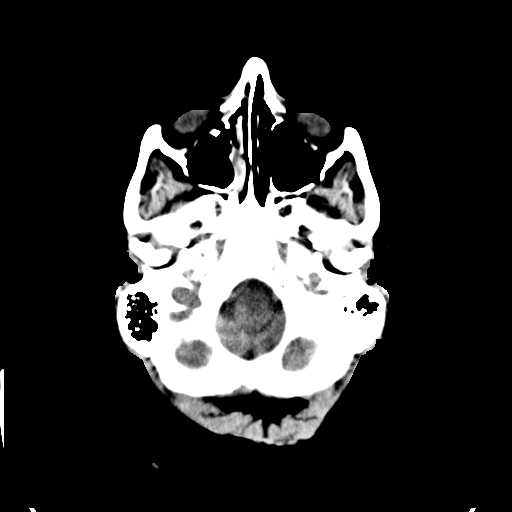
[im 7/32  brain]
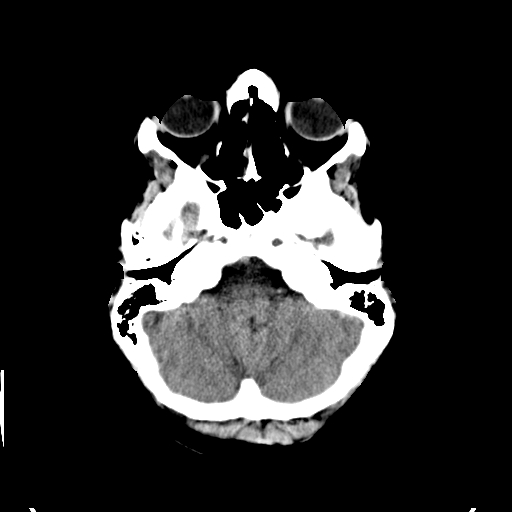
[im 9/32  brain]
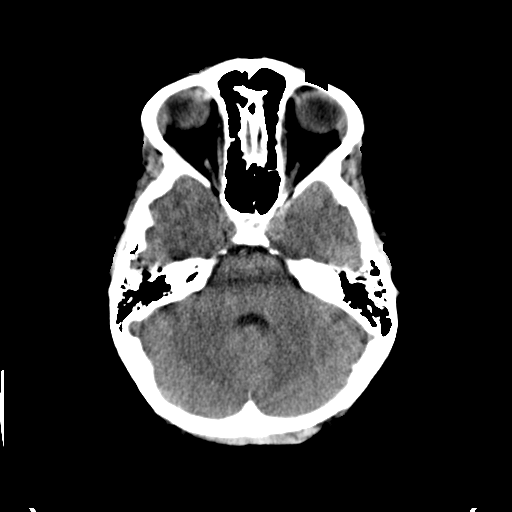
[im 12/32  brain]
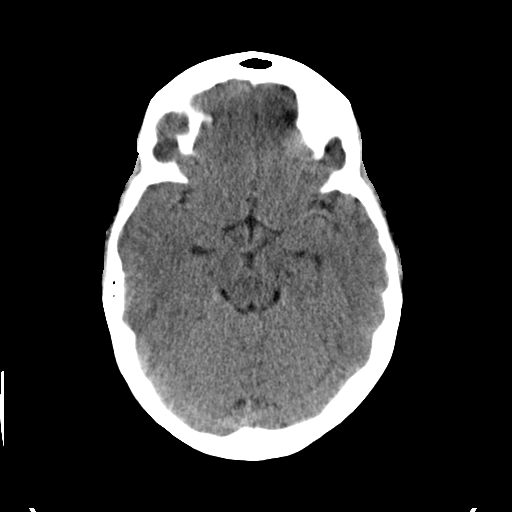
[im 12/32  bone]
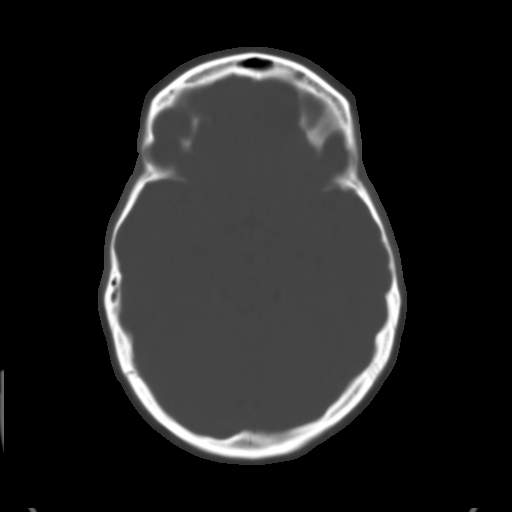
[im 14/32  brain]
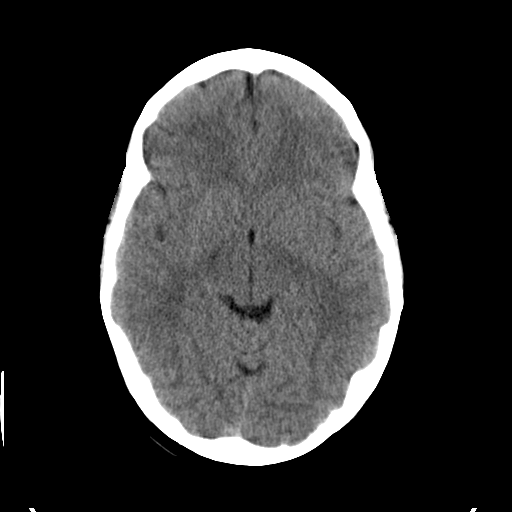
[im 16/32  brain]
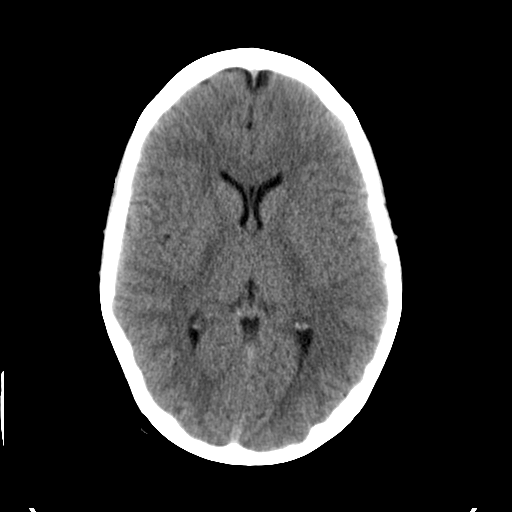
[im 18/32  brain]
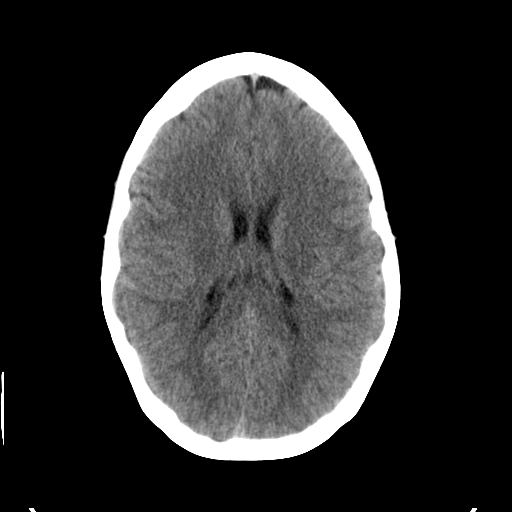
[im 20/32  brain]
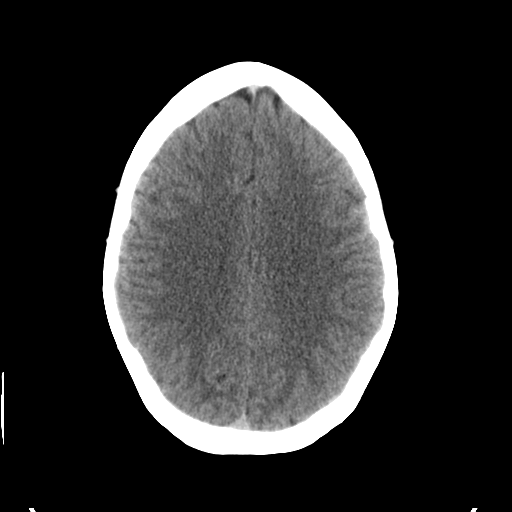
[im 20/32  bone]
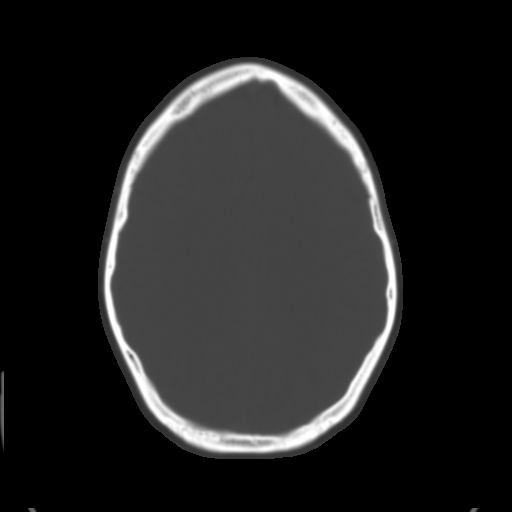
[im 23/32  brain]
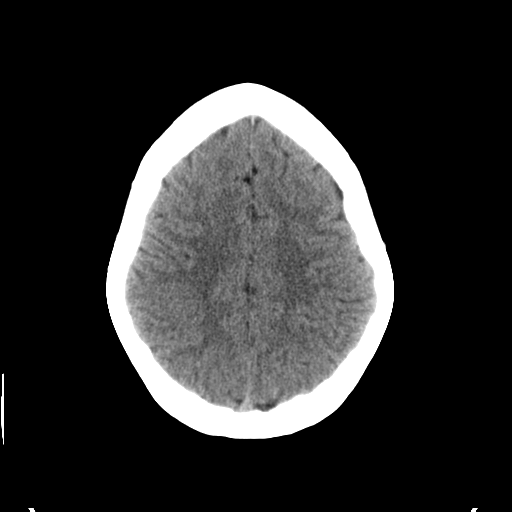
[im 25/32  brain]
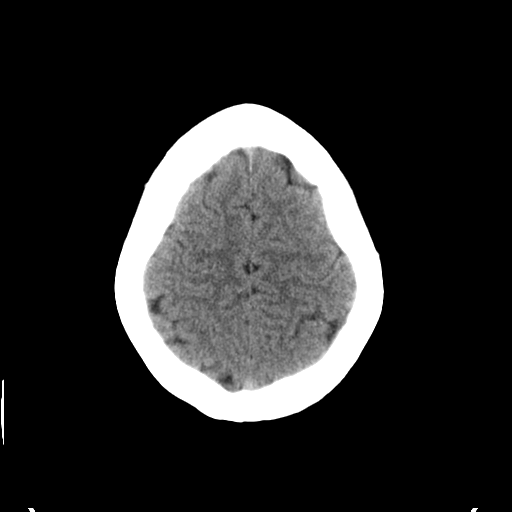
[im 27/32  brain]
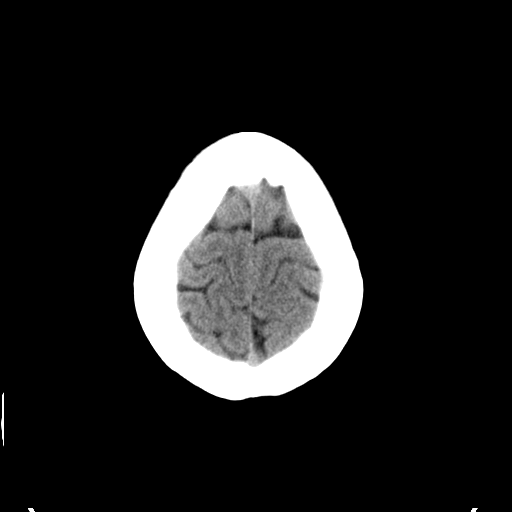
[im 29/32  brain]
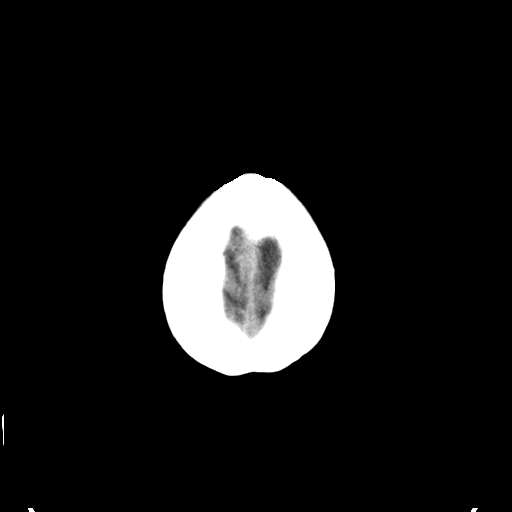
[im 29/32  bone]
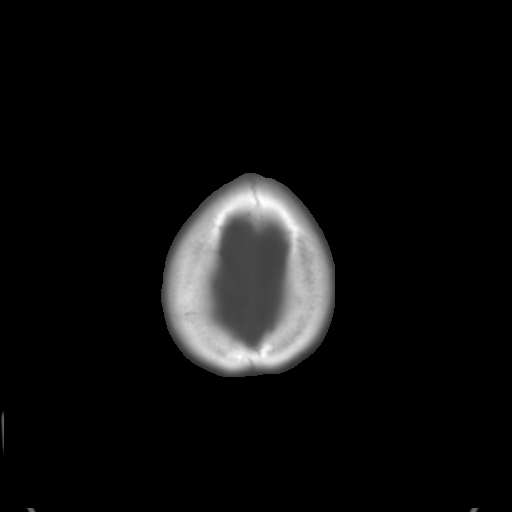

[Series 5: bone · axial · 0.45mm/px · z∈[-194,-148]mm · 3 of 32 slices shown]
[im 3/32  bone]
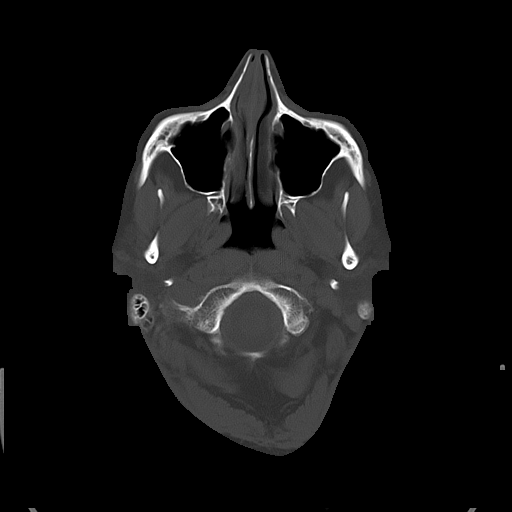
[im 7/32  bone]
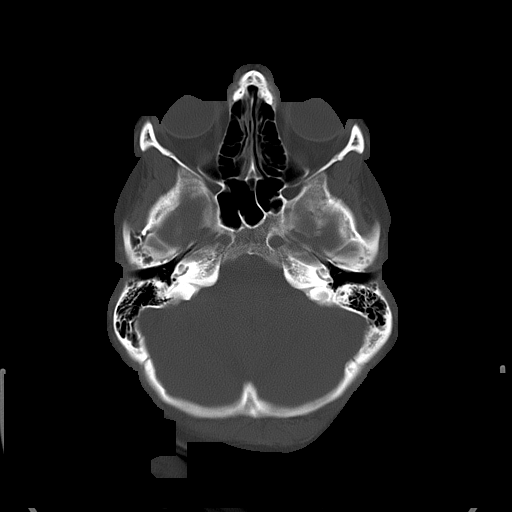
[im 12/32  bone]
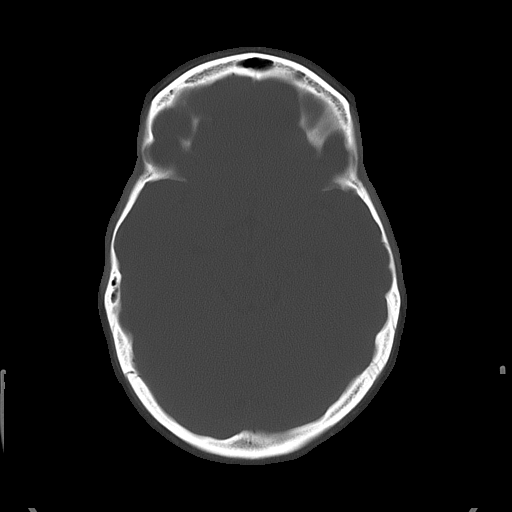

[16 of 30 positions shown; findings below may reference images not displayed]

PROCEDURE:     CT  - CT HEAD WITHOUT CONTRAST  - [DATE] [DATE]

RESULT:     Emergent noncontrast CT of the brain is performed. The patient
has no prior exam for comparison.

 The ventricles and sulci are normal. There is no hemorrhage. There is no
focal mass, mass-effect or midline shift. There is no evidence of edema or
territorial infarct. The bone windows demonstrate normal aeration of the
paranasal sinuses and mastoid air cells. There is no skull fracture
demonstrated.
IMPRESSION: 1. No acute intracranial abnormality.

## 2007-02-11 ENCOUNTER — Emergency Department (HOSPITAL_COMMUNITY): Admission: EM | Admit: 2007-02-11 | Discharge: 2007-02-11 | Payer: Self-pay | Admitting: Emergency Medicine

## 2007-02-11 IMAGING — CR DG WRIST COMPLETE 3+V*R*
4 series · 4 of 4 positions shown · non-contrast
Comparison: none

CLINICAL DATA: 25-year-old female, wrist pain.
 RIGHT WRIST - 4 VIEW:

[x wrist pa right]
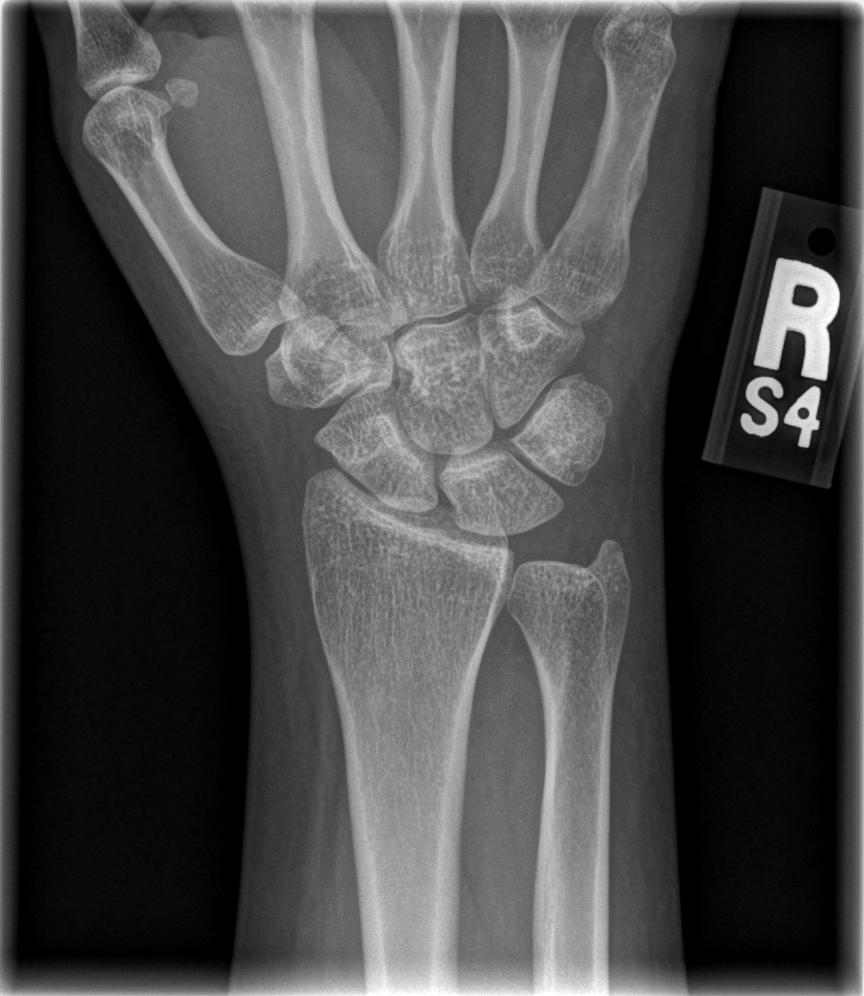

[x wrist obl right]
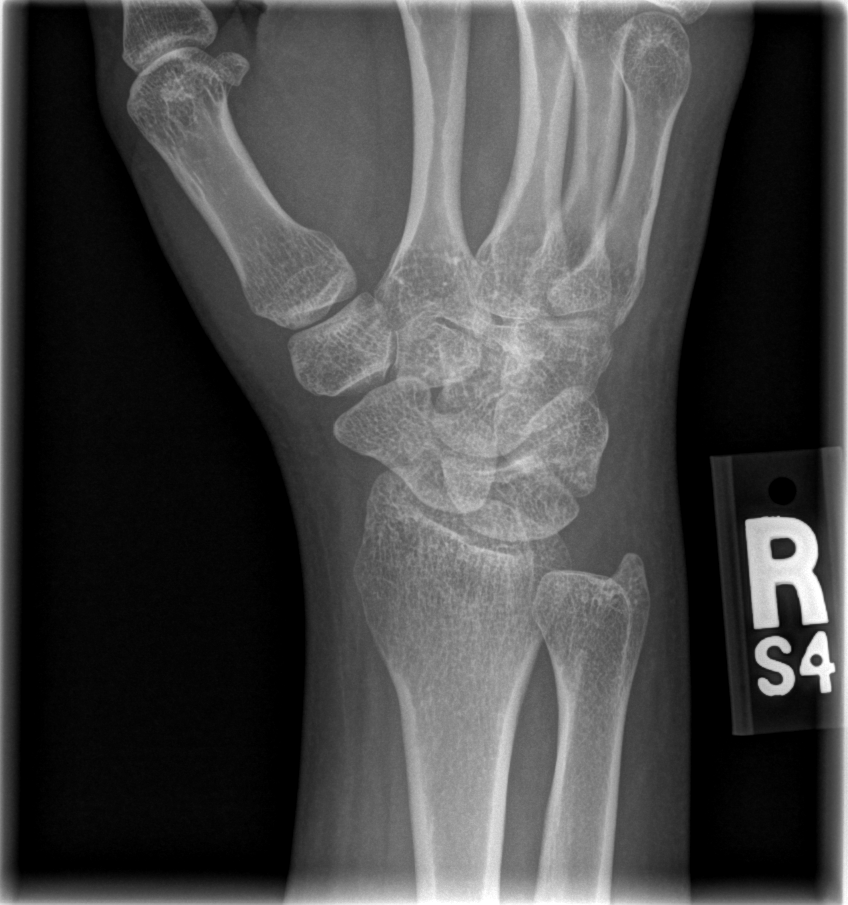

[x wrist lat right]
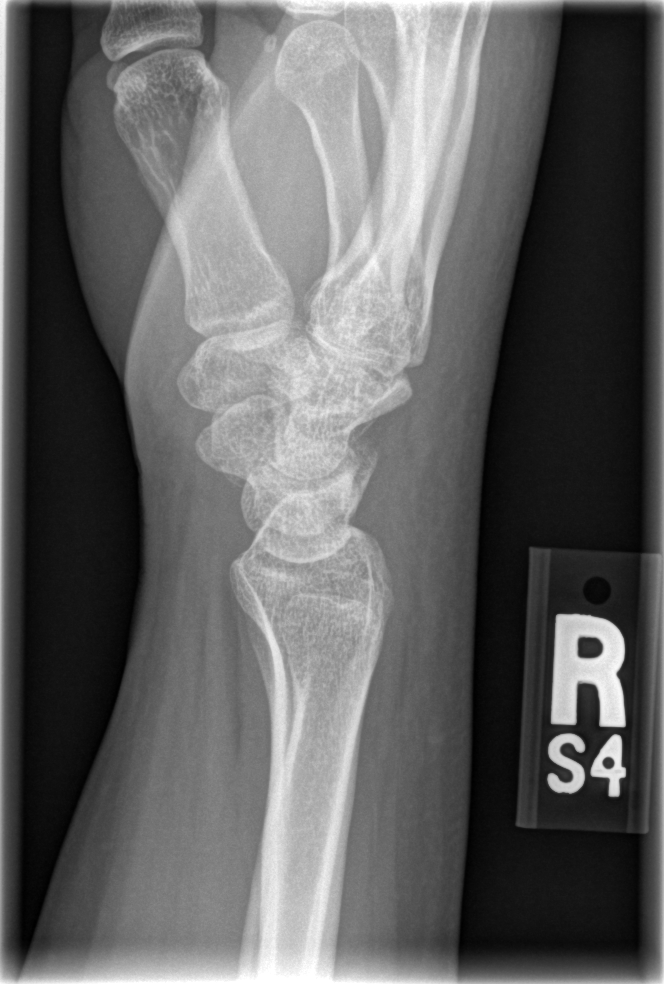

[x navicular]
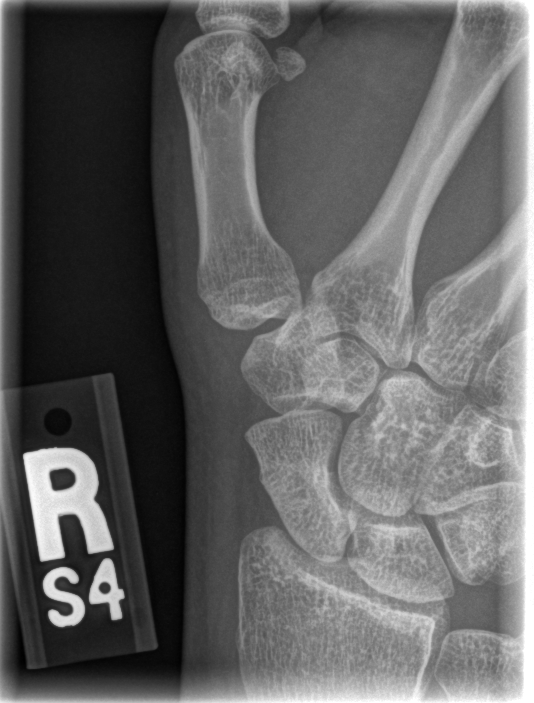

[4 of 4 positions shown; findings below may reference images not displayed]

FINDINGS: There is soft tissue swelling along the dorsum of the wrist and hand.  There is no underlying fracture or dislocation.
IMPRESSION: 1.  Soft tissue swelling along the dorsum of the wrist and hand.  
 2.  No underlying fracture.

## 2007-02-11 IMAGING — CR DG HAND COMPLETE 3+V*R*
3 series · 3 of 3 positions shown · non-contrast
Comparison: none

CLINICAL DATA: 25-year-old female, status post fall.   Bruising and swelling.
 RIGHT HAND - 3 VIEW:

[x hand pa right]
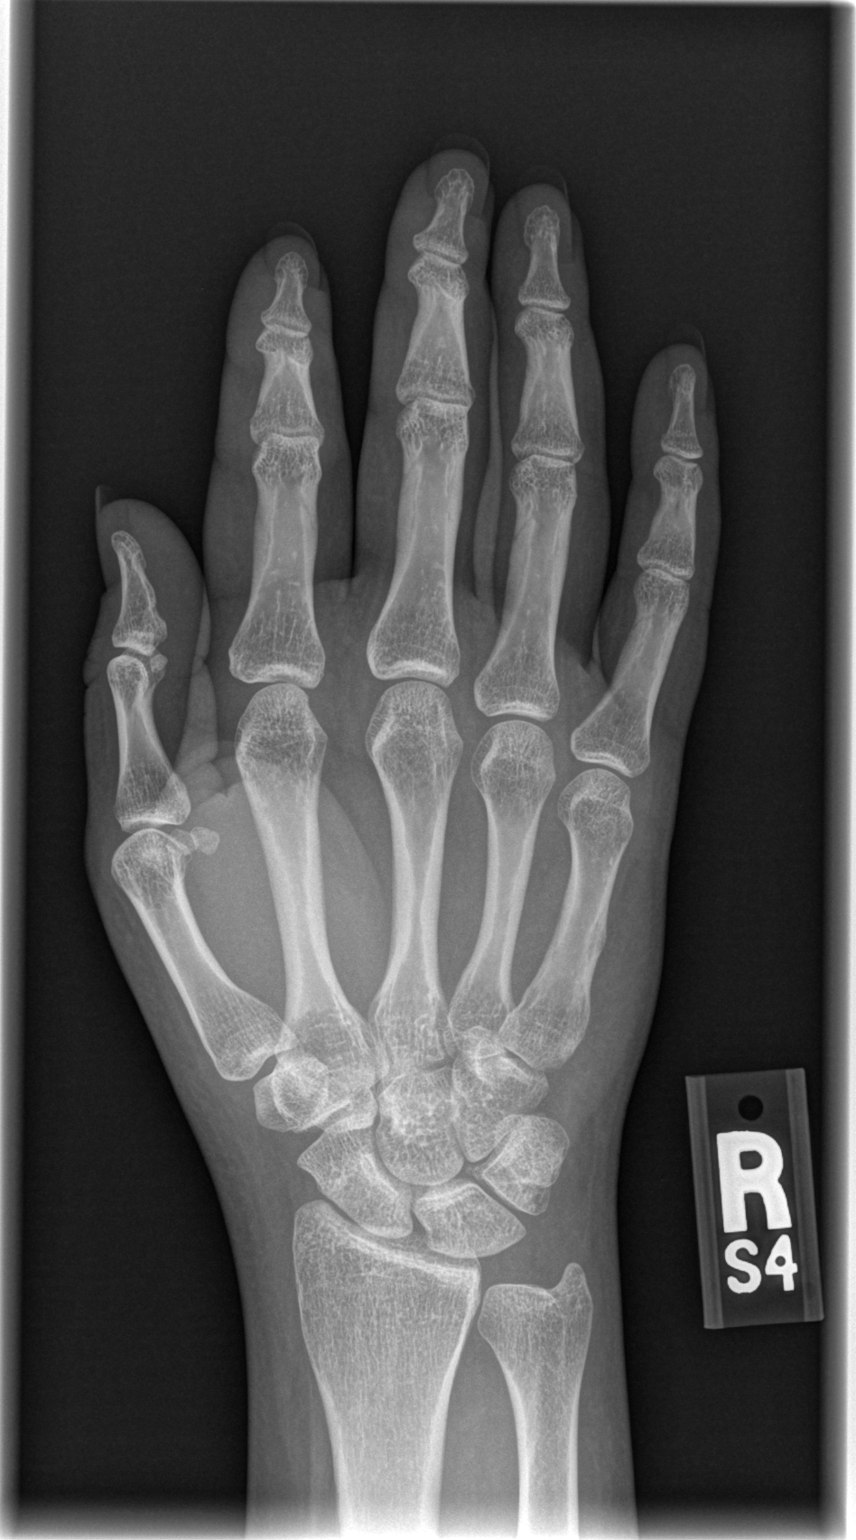

[x hand oblique right]
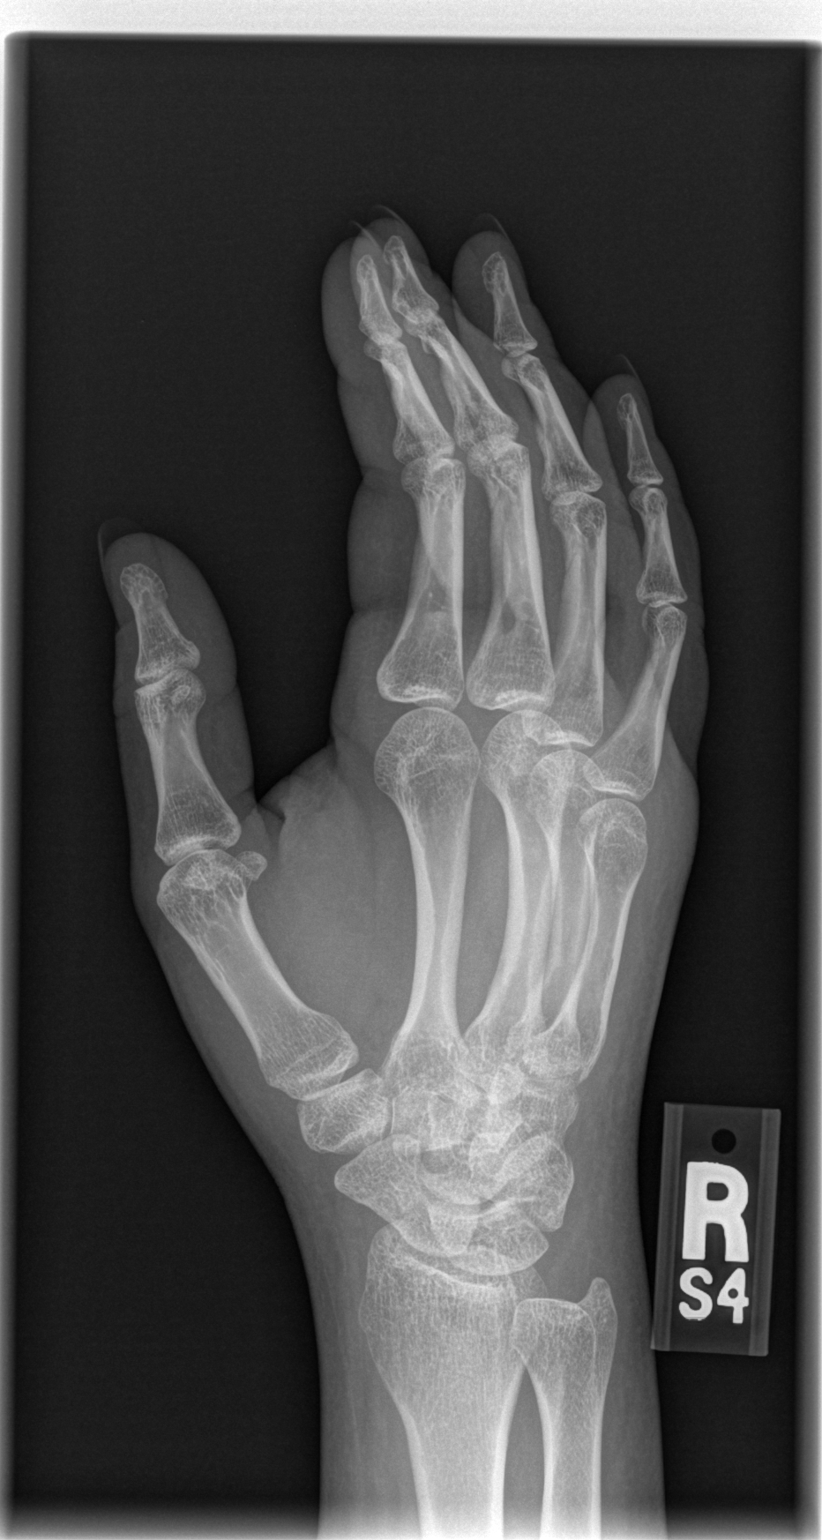

[x hand lat right]
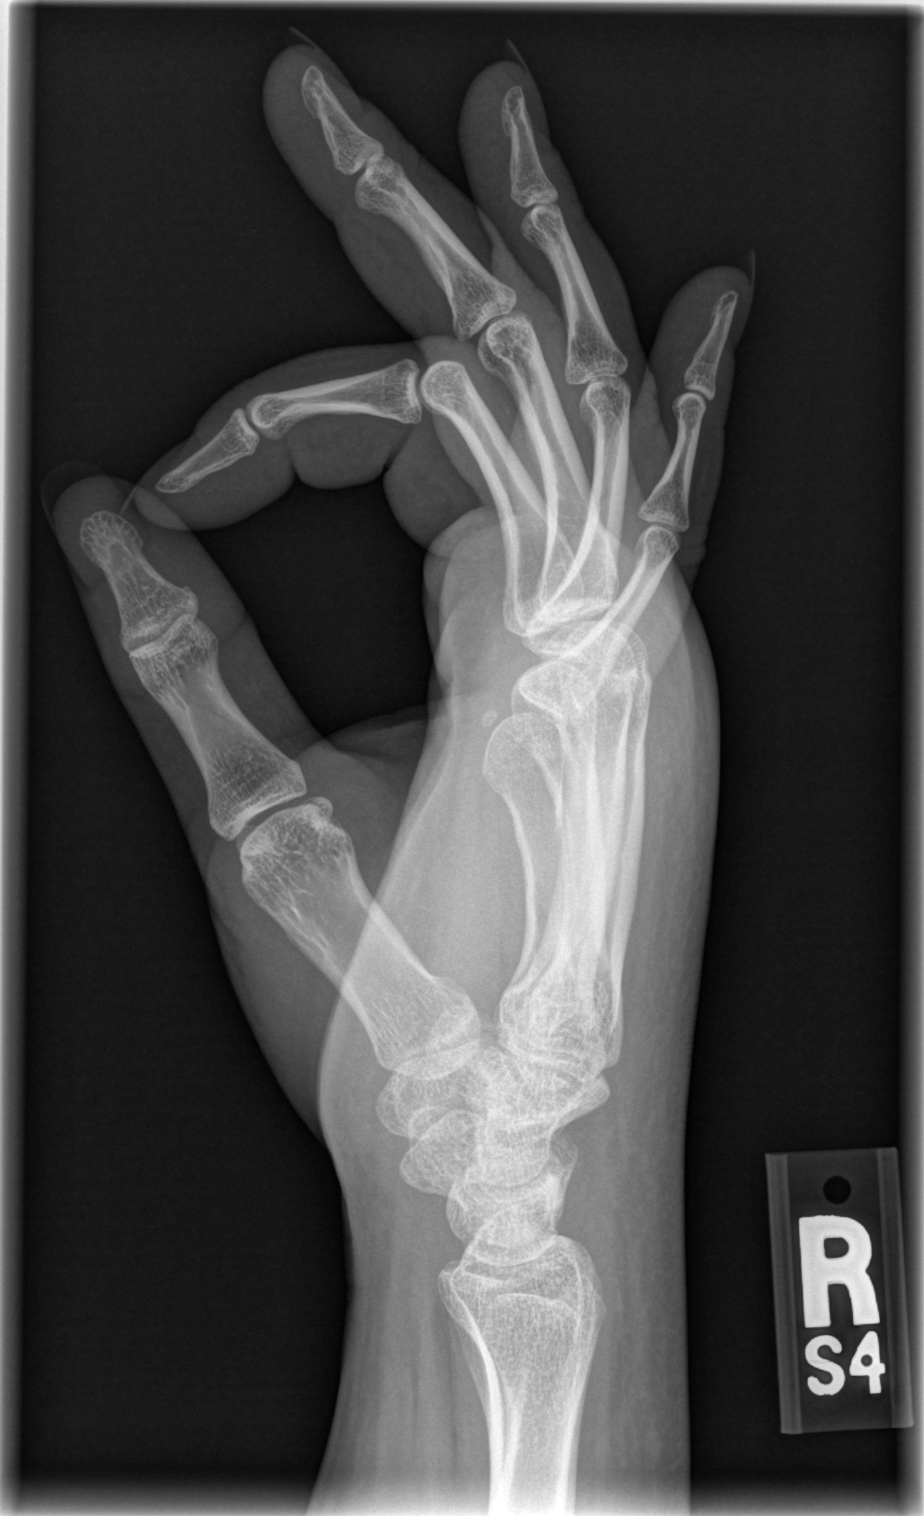

[3 of 3 positions shown; findings below may reference images not displayed]

FINDINGS: Three views of the right hand demonstrate significant soft tissue swelling over the dorsum of the hand and wrist without underlying fracture.
IMPRESSION: Soft tissue swelling over the dorsum of the hand without underlying fracture.

## 2007-02-19 ENCOUNTER — Ambulatory Visit: Payer: Self-pay | Admitting: Anesthesiology

## 2007-11-26 ENCOUNTER — Ambulatory Visit: Payer: Self-pay | Admitting: Family Medicine

## 2007-12-05 ENCOUNTER — Ambulatory Visit (HOSPITAL_COMMUNITY): Admission: RE | Admit: 2007-12-05 | Discharge: 2007-12-05 | Payer: Self-pay | Admitting: Gynecology

## 2007-12-05 IMAGING — US US OB TRANSVAGINAL MODIFY
1 series · 14 of 28 positions shown · non-contrast
Comparison: none

OBSTETRICAL ULTRASOUND:
 This ultrasound exam was performed in the [HOSPITAL] Ultrasound Department.  The OB US report was generated in the AS system, and faxed to the ordering physician.  This report is also available in [REDACTED] PACS.

[Series 1: us ob transvaginal modify · 0.28mm/px · 14 of 33 slices shown]
[im 2/33]
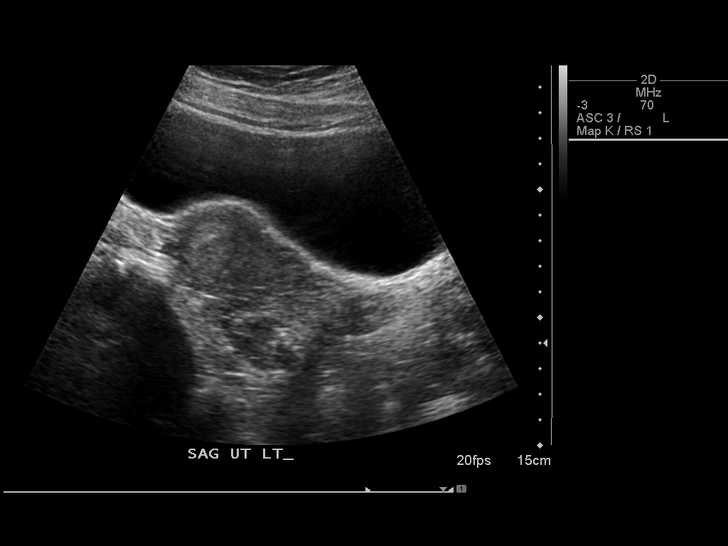
[im 4/33]
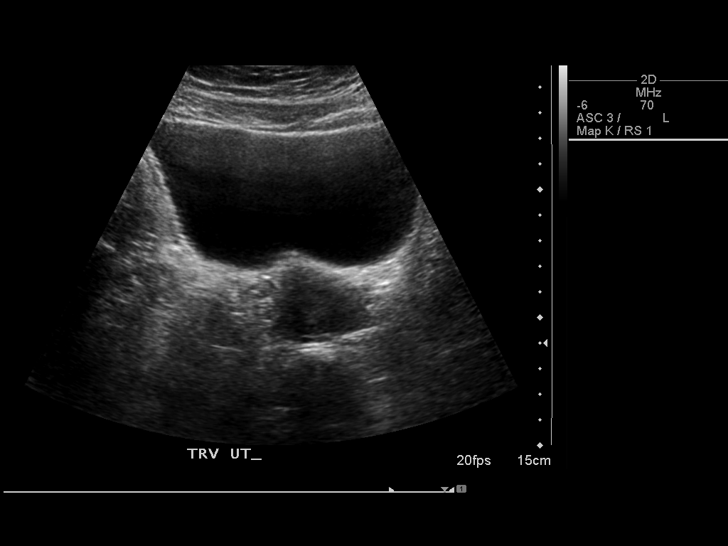
[im 6/33]
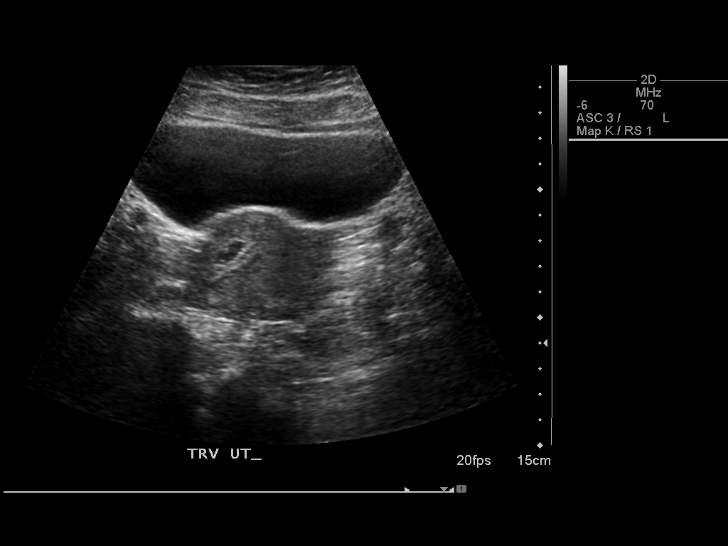
[im 9/33]
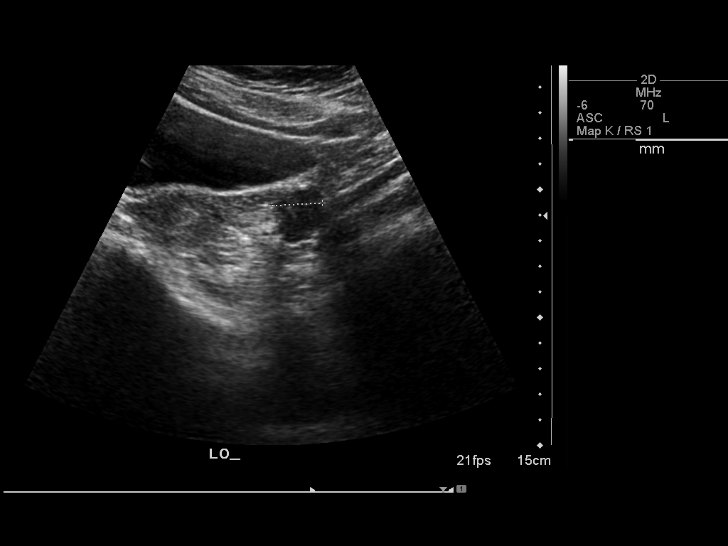
[im 11/33]
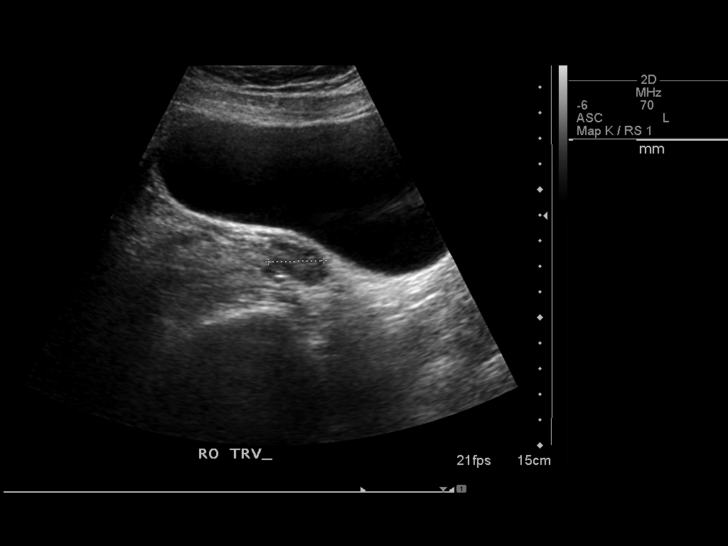
[im 14/33]
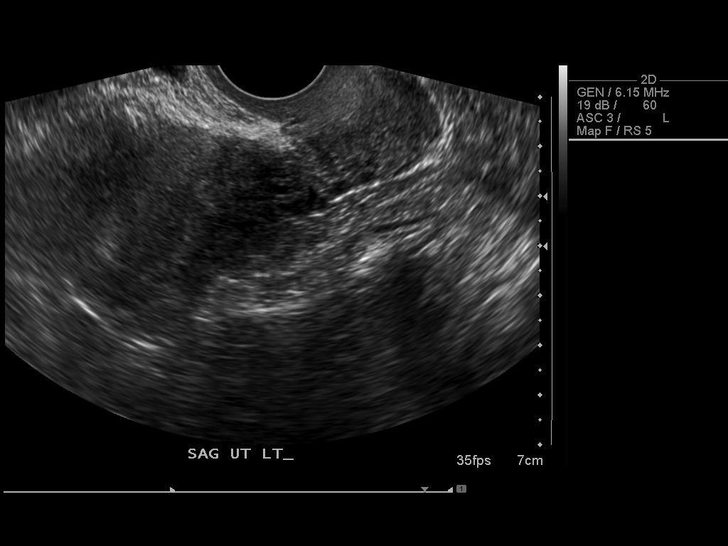
[im 16/33]
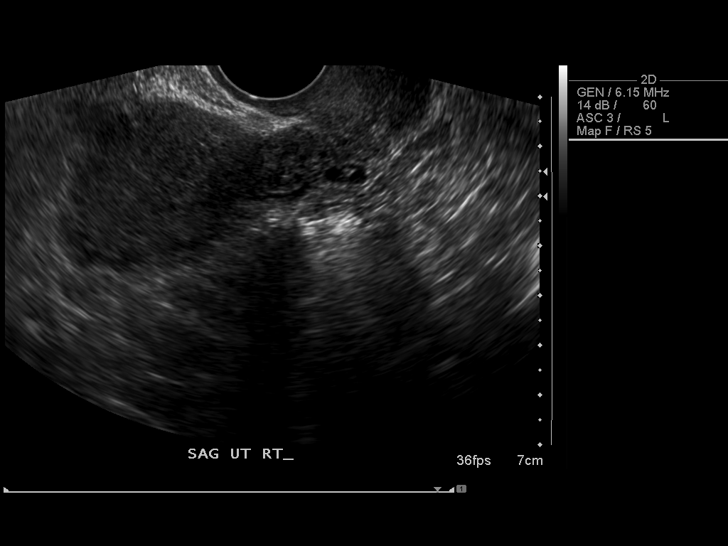
[im 18/33]
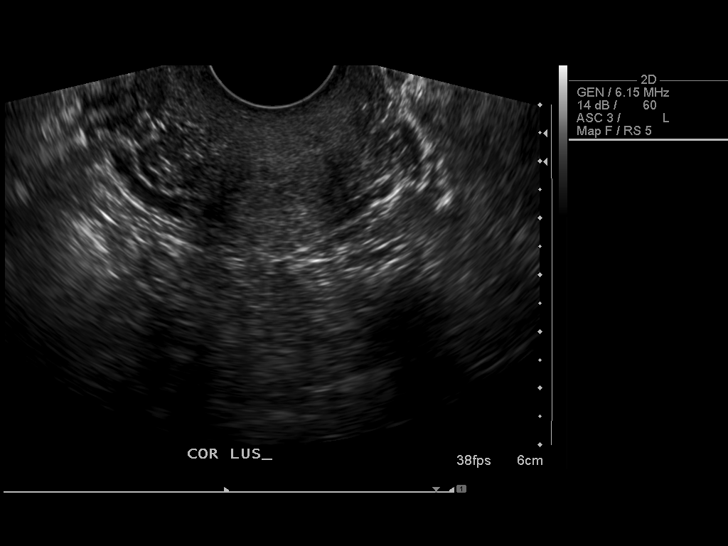
[im 21/33]
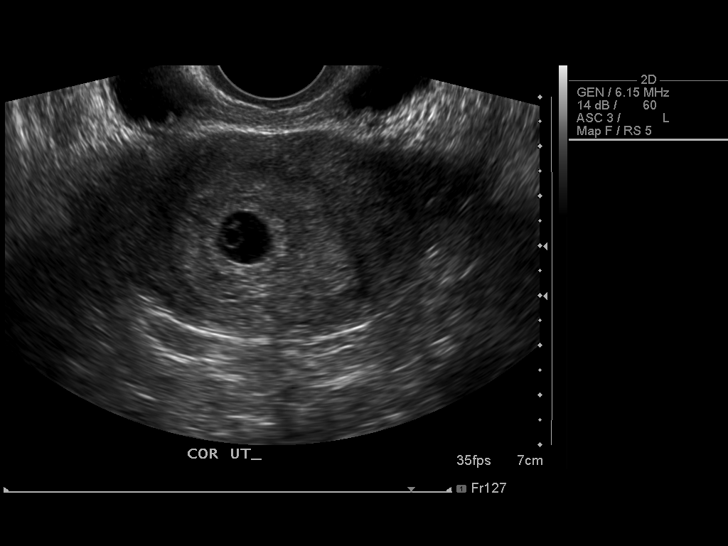
[im 23/33]
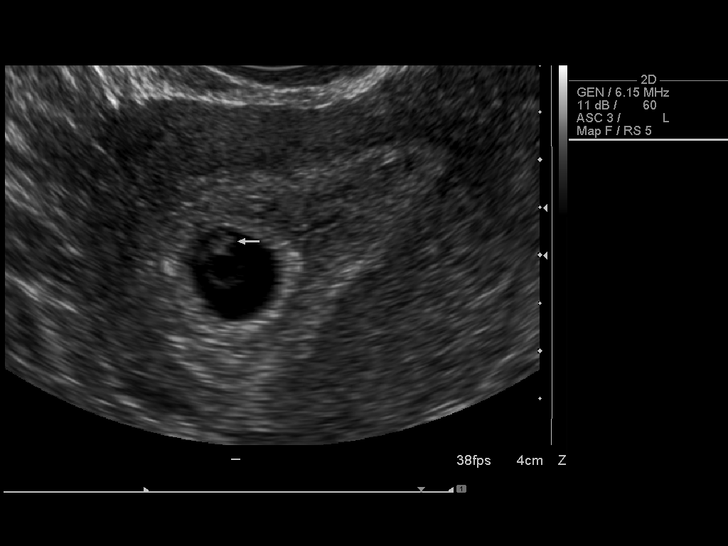
[im 25/33]
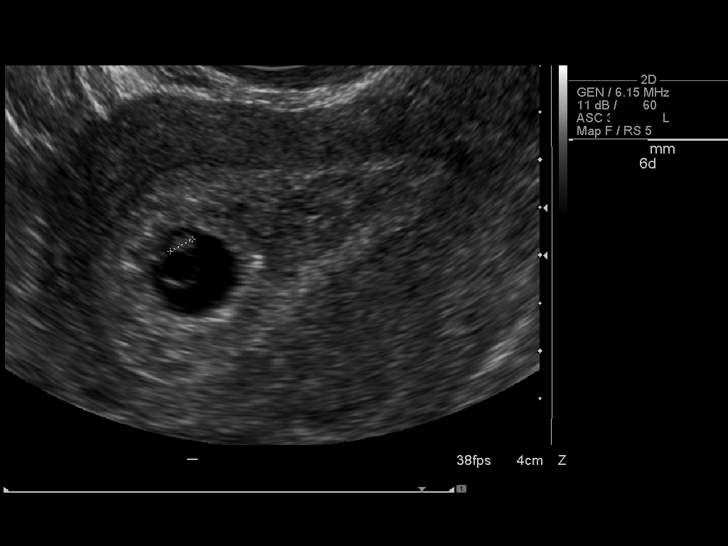
[im 28/33]
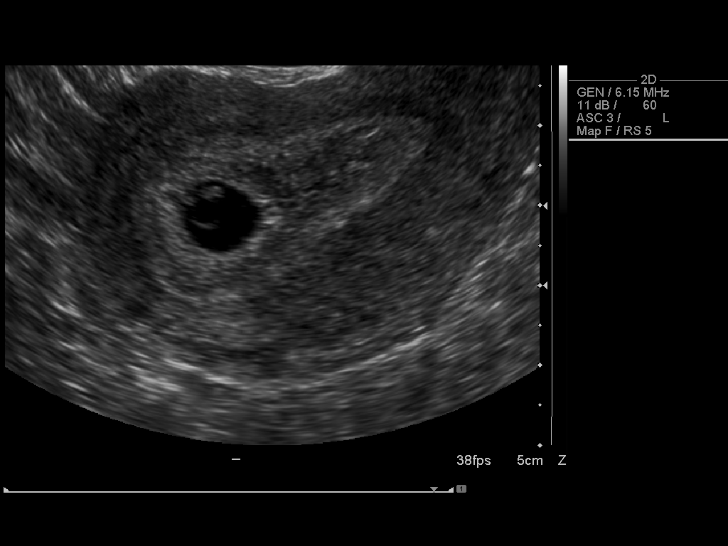
[im 30/33]
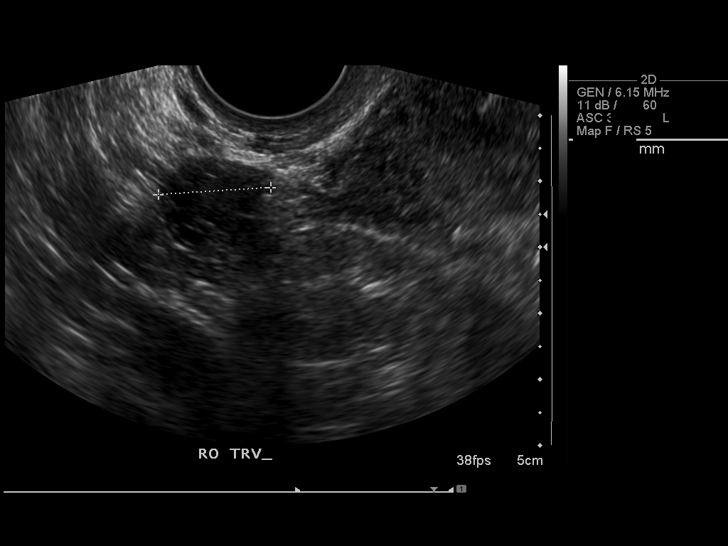
[im 33/33]
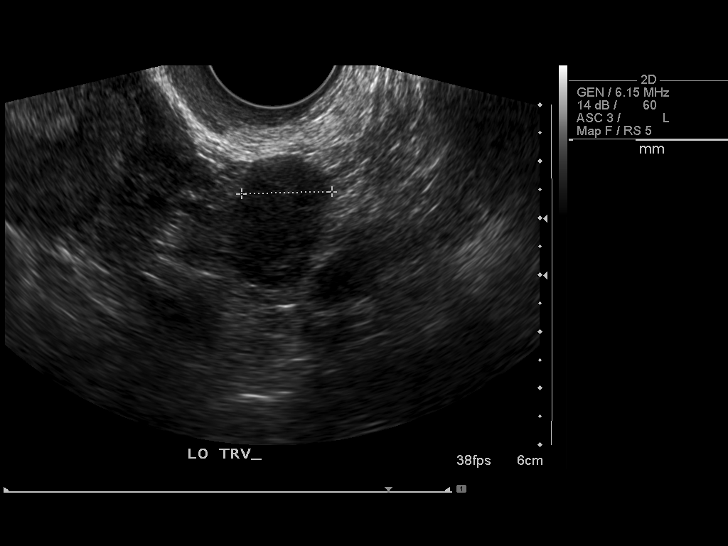

[14 of 28 positions shown; findings below may reference images not displayed]

IMPRESSION: See [facility] Obstetric US report.

## 2007-12-08 ENCOUNTER — Ambulatory Visit: Payer: Self-pay | Admitting: Gynecology

## 2007-12-23 ENCOUNTER — Ambulatory Visit: Payer: Self-pay | Admitting: Family Medicine

## 2007-12-23 ENCOUNTER — Encounter: Payer: Self-pay | Admitting: Family Medicine

## 2008-01-01 ENCOUNTER — Ambulatory Visit: Payer: Self-pay | Admitting: Nurse Practitioner

## 2008-01-14 ENCOUNTER — Ambulatory Visit (HOSPITAL_COMMUNITY): Admission: RE | Admit: 2008-01-14 | Discharge: 2008-01-14 | Payer: Self-pay | Admitting: Gynecology

## 2008-01-14 IMAGING — US US OB NUCHAL TRANSLUCENCY 1ST GEST
1 series · 17 of 17 positions shown · non-contrast
Comparison: none

OBSTETRICAL ULTRASOUND:
 This ultrasound was performed in The [HOSPITAL], and the [facility] report will be stored to [REDACTED] PACS.

[Series 1: us ob nuchal translucency 1st gest · 17 of 17 slices shown]
[im 1/17]
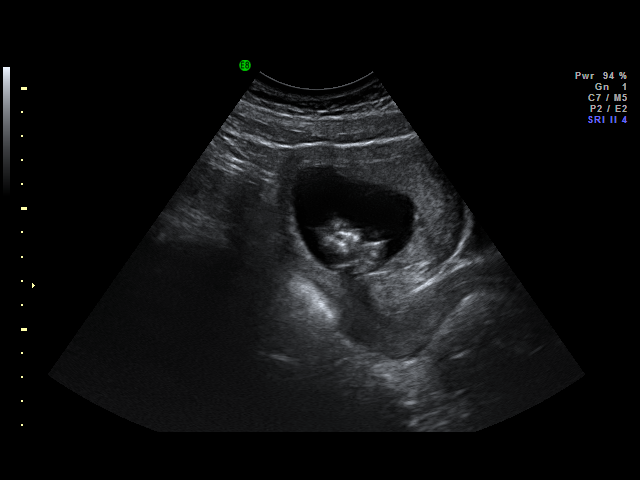
[im 2/17]
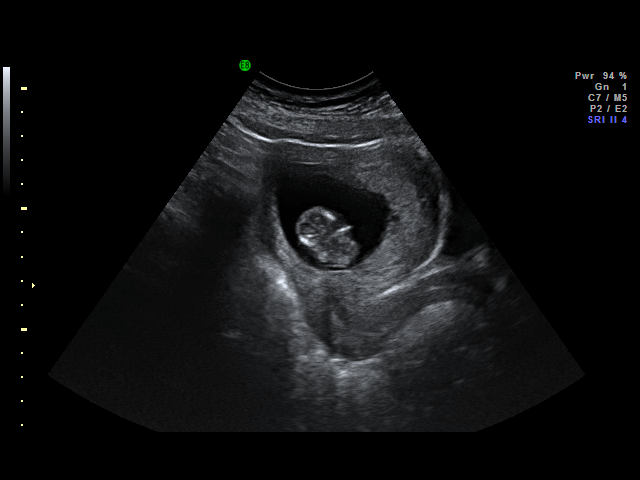
[im 3/17]
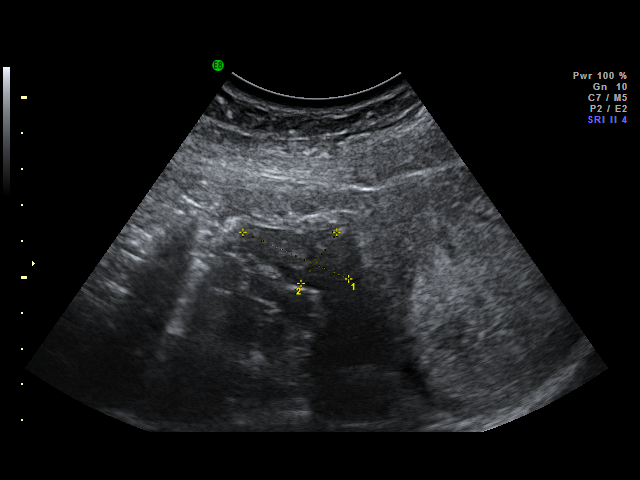
[im 4/17]
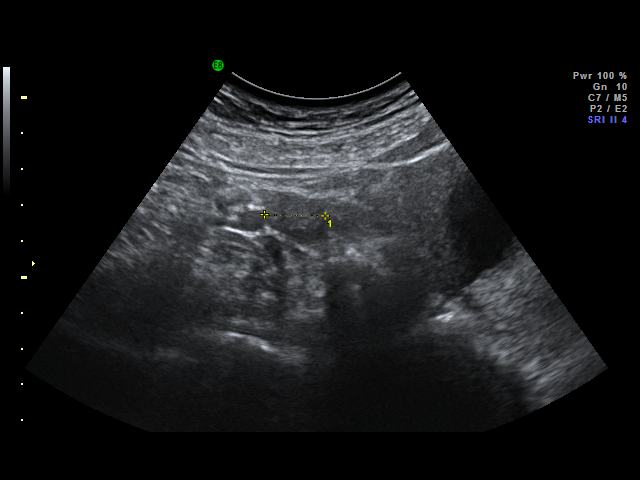
[im 5/17]
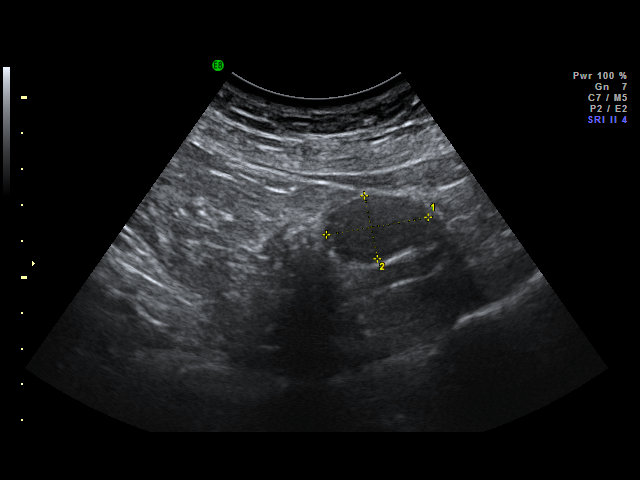
[im 6/17]
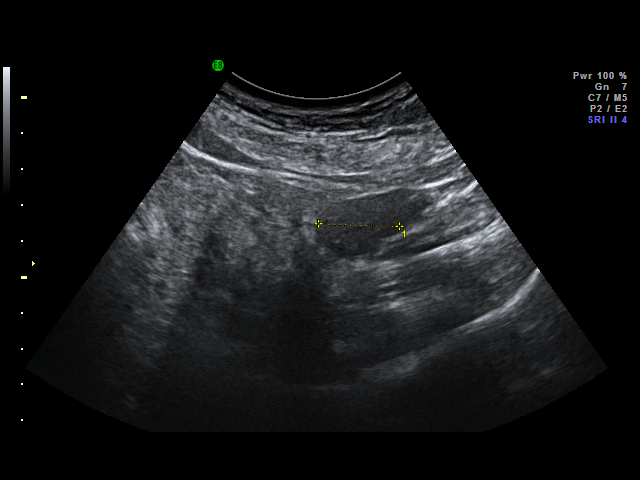
[im 7/17]
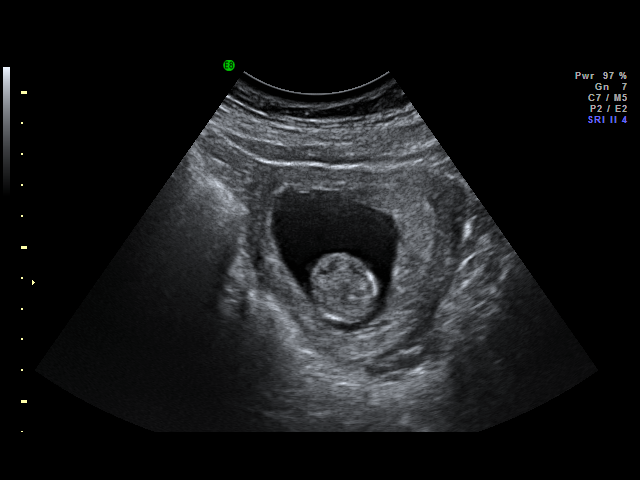
[im 8/17]
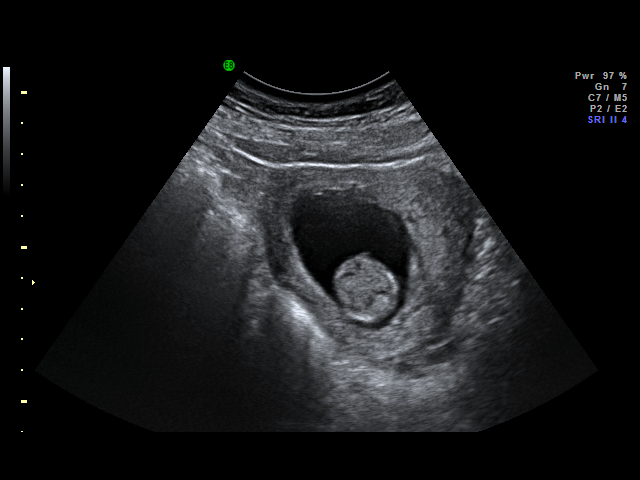
[im 9/17]
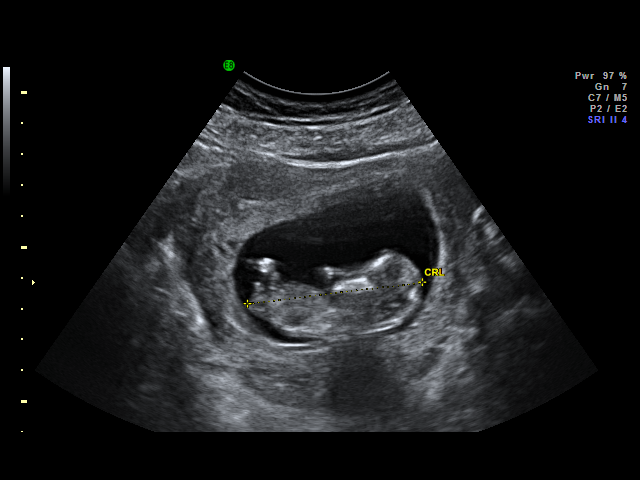
[im 10/17]
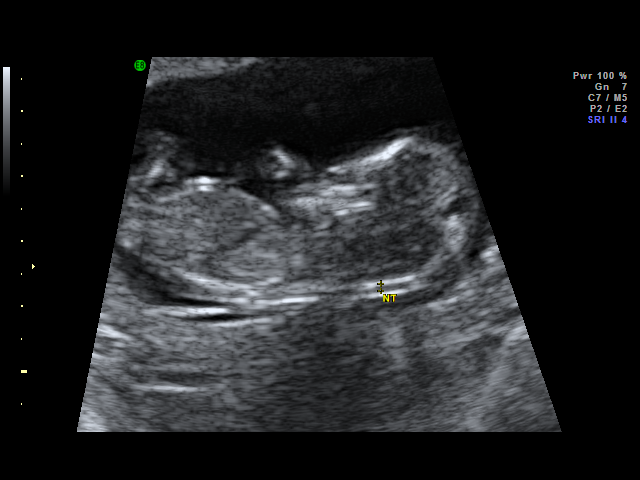
[im 11/17]
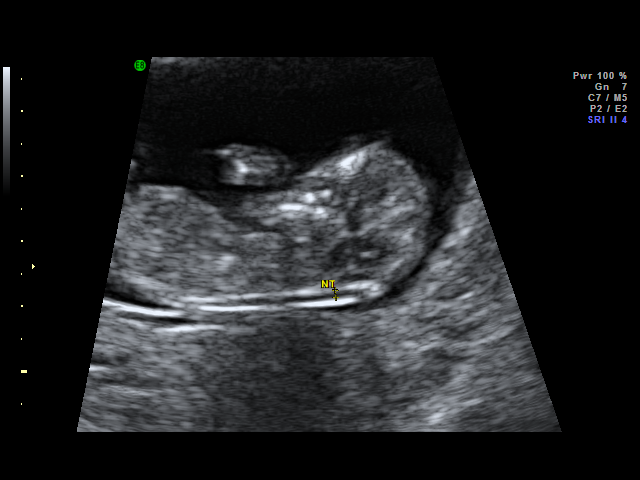
[im 12/17]
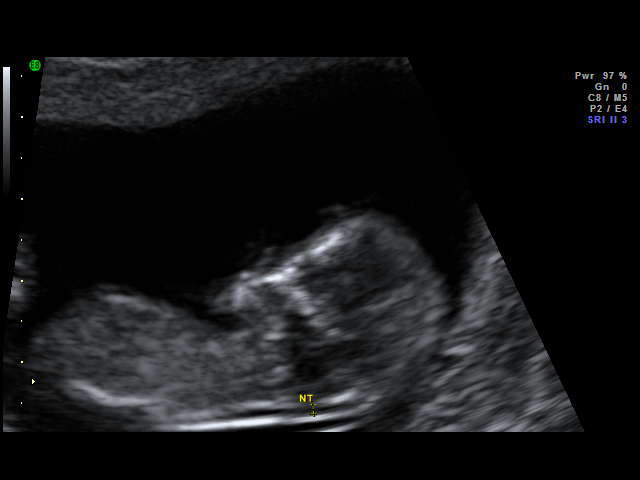
[im 13/17]
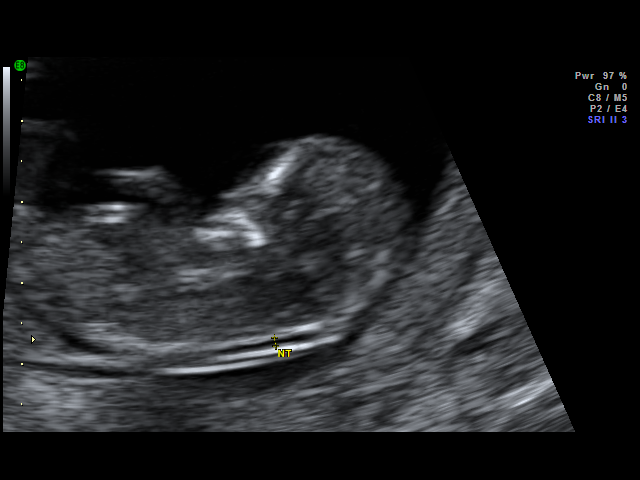
[im 14/17]
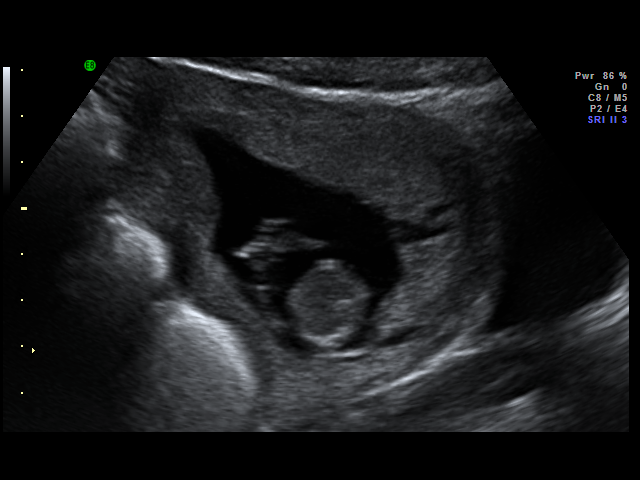
[im 15/17]
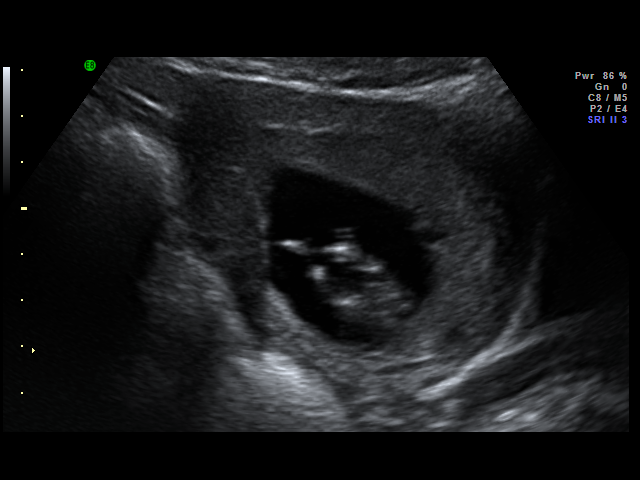
[im 16/17]
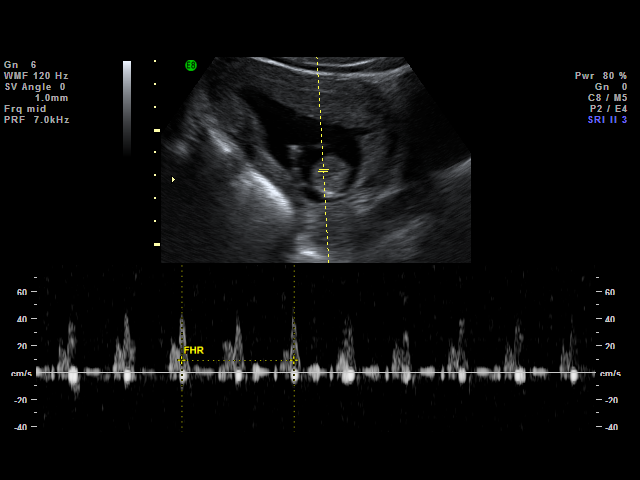
[im 17/17]
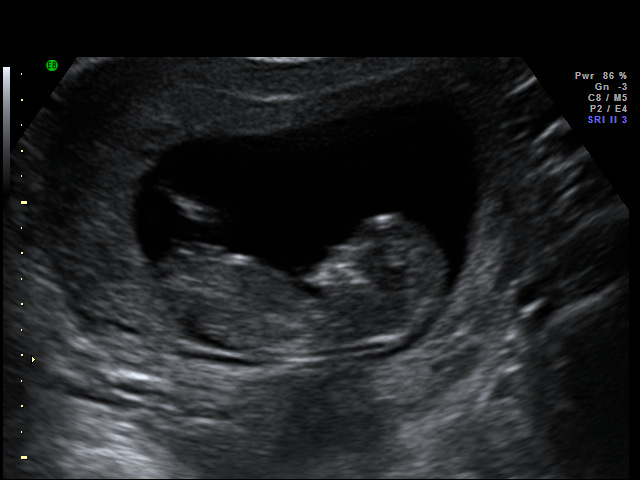

[17 of 17 positions shown; findings below may reference images not displayed]

IMPRESSION: [facility] has also been faxed to the ordering physician.

## 2008-01-15 ENCOUNTER — Ambulatory Visit: Payer: Self-pay | Admitting: Obstetrics & Gynecology

## 2008-01-28 ENCOUNTER — Ambulatory Visit: Payer: Self-pay | Admitting: Gynecology

## 2008-02-11 ENCOUNTER — Ambulatory Visit (HOSPITAL_COMMUNITY): Admission: RE | Admit: 2008-02-11 | Discharge: 2008-02-11 | Payer: Self-pay | Admitting: Gynecology

## 2008-02-12 ENCOUNTER — Ambulatory Visit: Payer: Self-pay | Admitting: Obstetrics & Gynecology

## 2008-02-26 ENCOUNTER — Ambulatory Visit: Payer: Self-pay | Admitting: Nurse Practitioner

## 2008-03-05 ENCOUNTER — Ambulatory Visit (HOSPITAL_COMMUNITY): Admission: RE | Admit: 2008-03-05 | Discharge: 2008-03-05 | Payer: Self-pay | Admitting: Gynecology

## 2008-03-05 IMAGING — US US OB DETAIL+14 WK
1 series · 18 of 28 positions shown · non-contrast
Comparison: none

OBSTETRICAL ULTRASOUND:
 This ultrasound was performed in The [HOSPITAL], and the [facility]/GY[facility] report will be stored to [REDACTED] PACS.

[Series 1: us ob detail+14 wk · 18 of 73 slices shown]
[im 1/73]
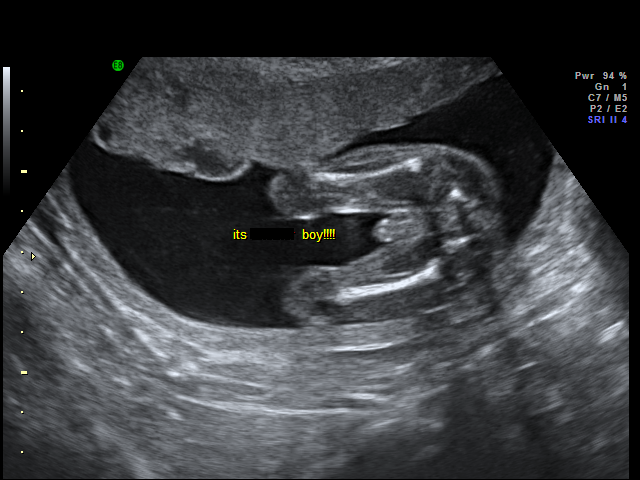
[im 6/73]
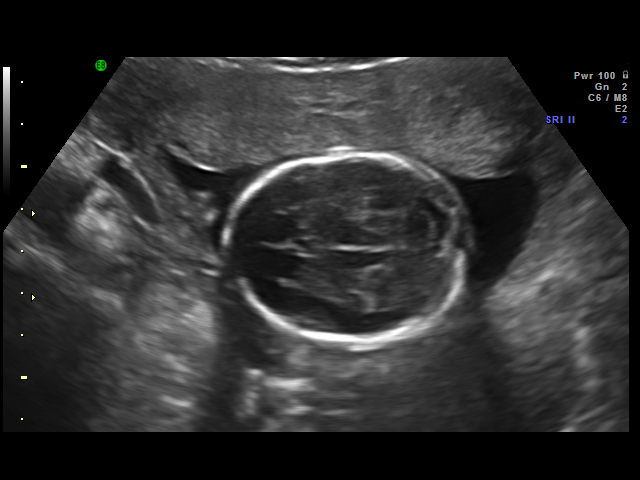
[im 9/73]
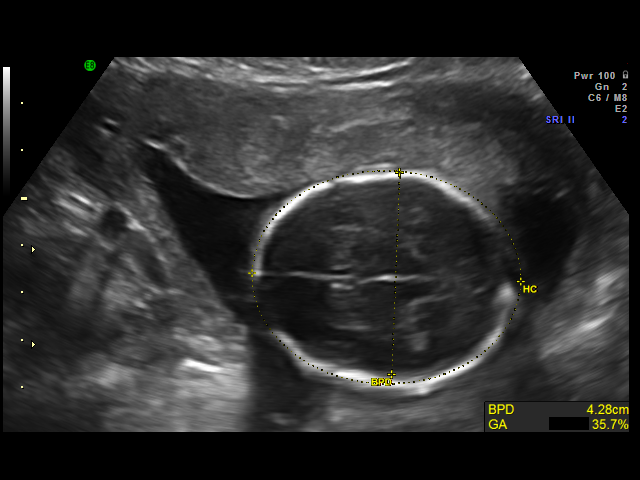
[im 14/73]
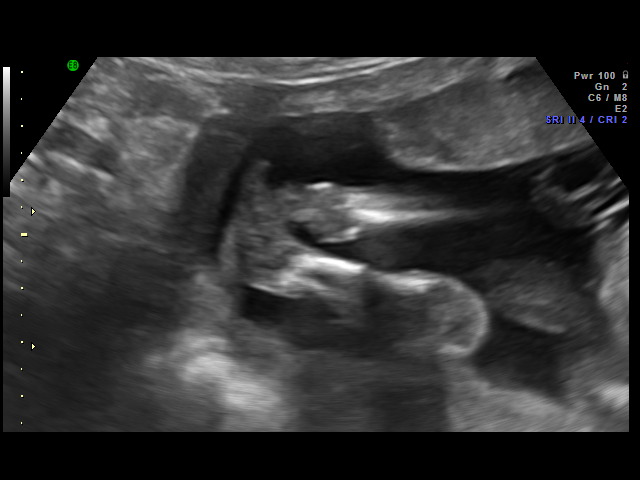
[im 19/73]
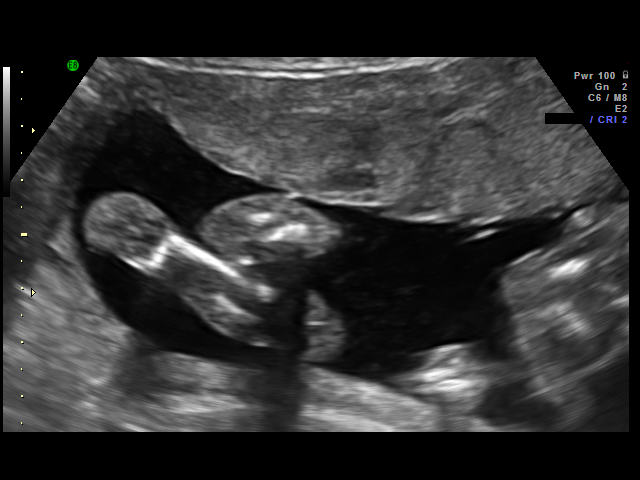
[im 22/73]
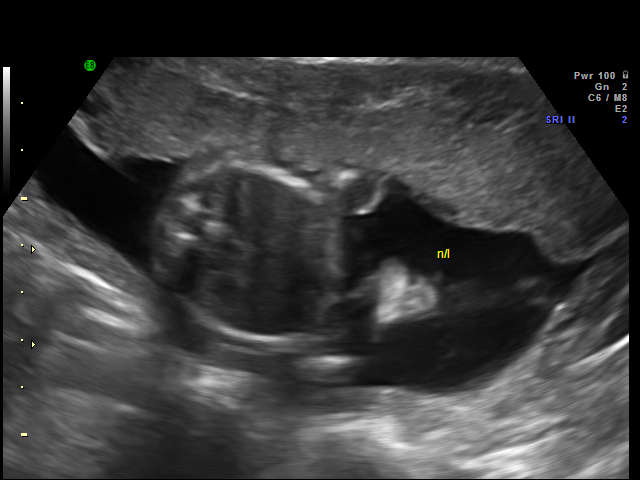
[im 27/73]
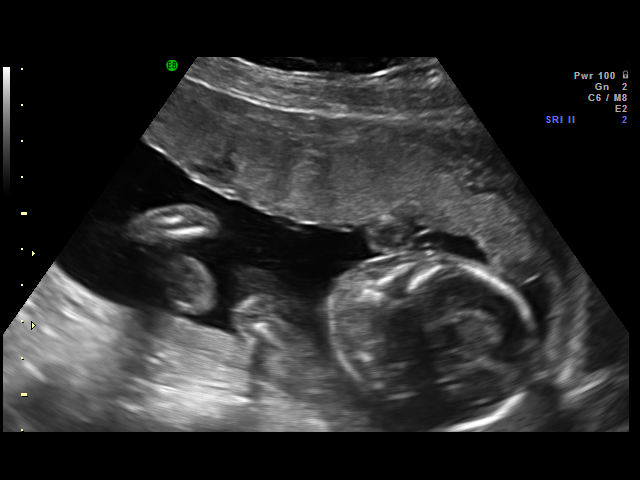
[im 30/73]
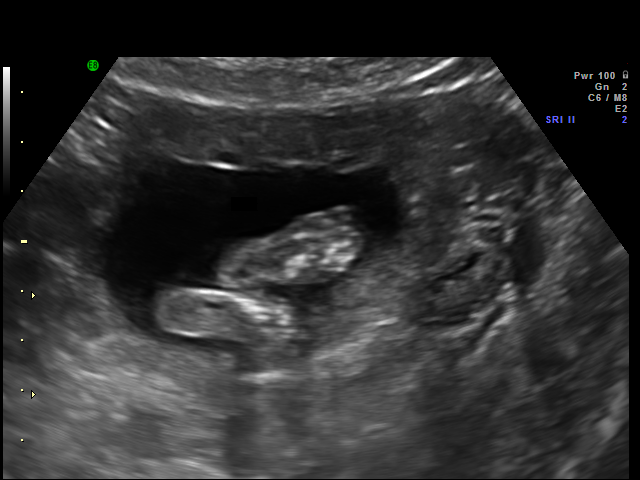
[im 35/73]
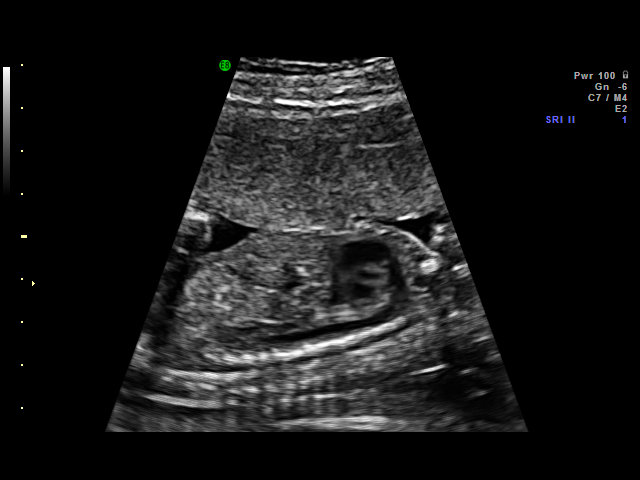
[im 38/73]
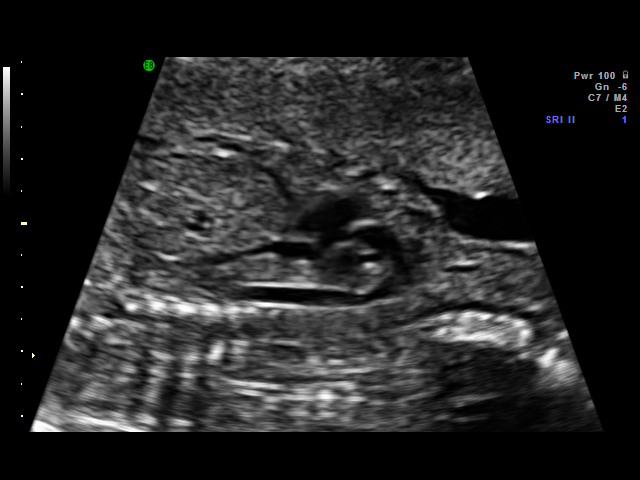
[im 43/73]
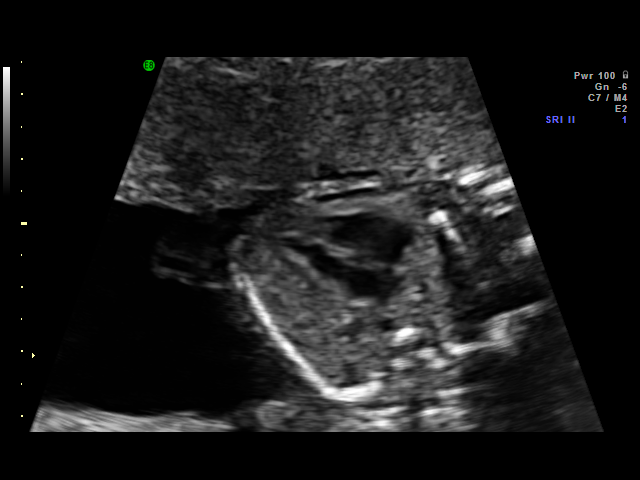
[im 46/73]
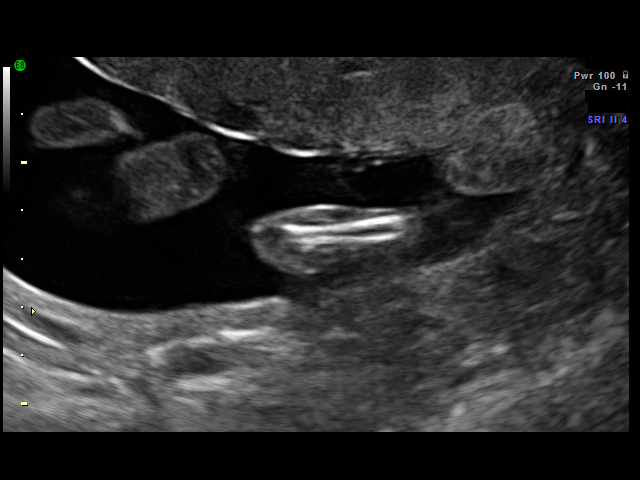
[im 51/73]
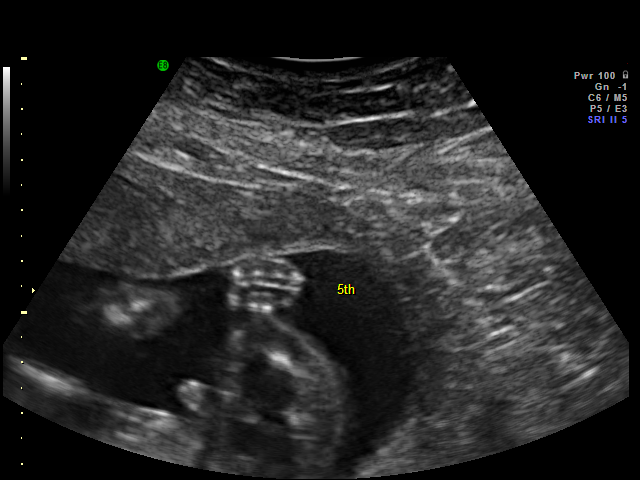
[im 57/73]
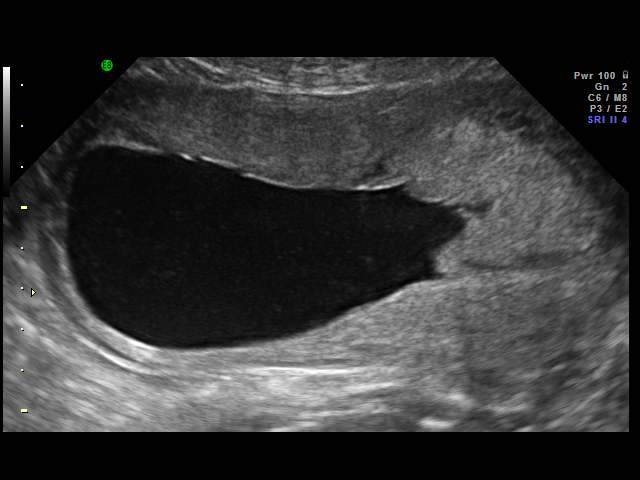
[im 59/73]
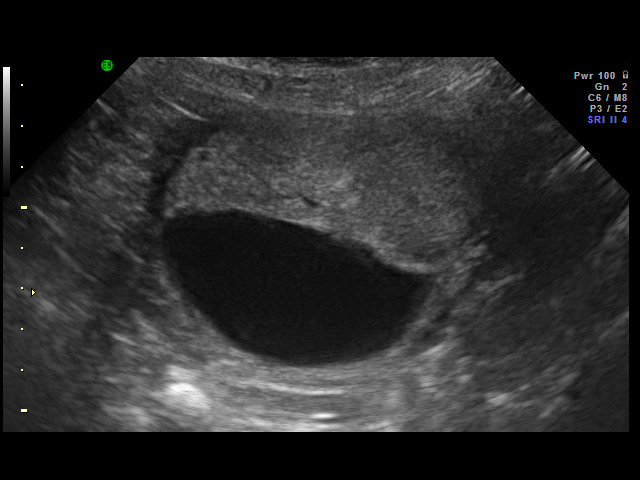
[im 65/73]
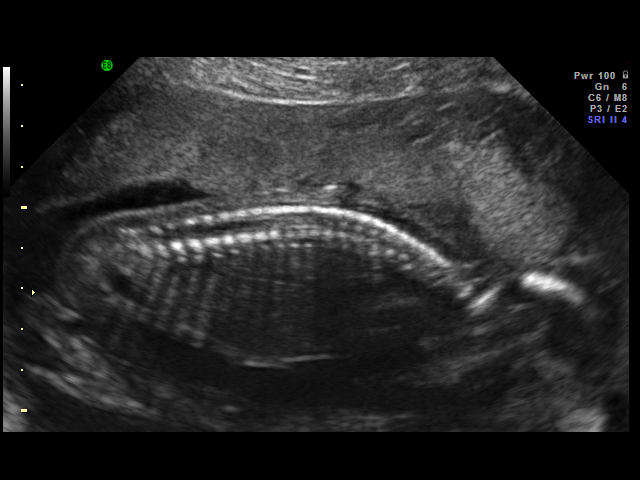
[im 67/73]
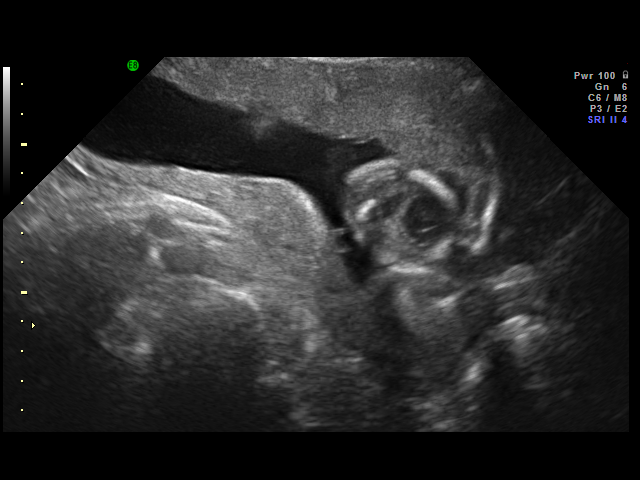
[im 73/73]
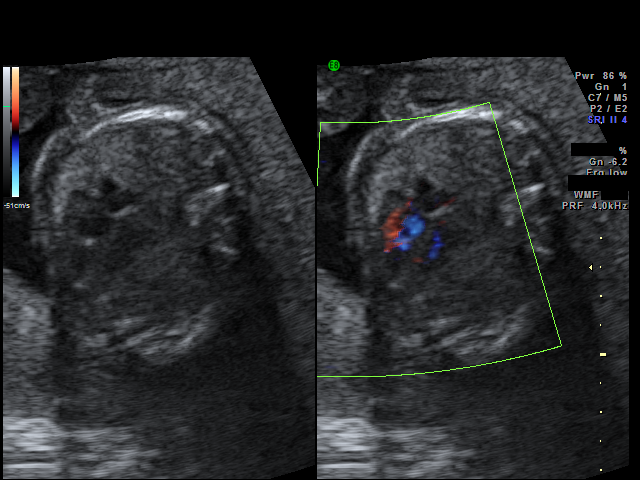

[18 of 28 positions shown; findings below may reference images not displayed]

IMPRESSION: [facility] OB/[facility]N has also been faxed to the ordering physician.

## 2008-03-09 ENCOUNTER — Ambulatory Visit: Payer: Self-pay | Admitting: Gynecology

## 2008-03-30 ENCOUNTER — Ambulatory Visit: Payer: Self-pay | Admitting: Obstetrics & Gynecology

## 2008-04-14 ENCOUNTER — Ambulatory Visit (HOSPITAL_COMMUNITY): Admission: RE | Admit: 2008-04-14 | Discharge: 2008-04-14 | Payer: Self-pay | Admitting: Gynecology

## 2008-04-14 IMAGING — US US OB FOLLOW-UP
1 series · 14 of 28 positions shown · non-contrast
Comparison: none

OBSTETRICAL ULTRASOUND:
 This ultrasound was performed in The [HOSPITAL], and the [facility] report will be stored to [REDACTED] PACS.

[Series 1: us ob follow-up · 14 of 41 slices shown]
[im 2/41]
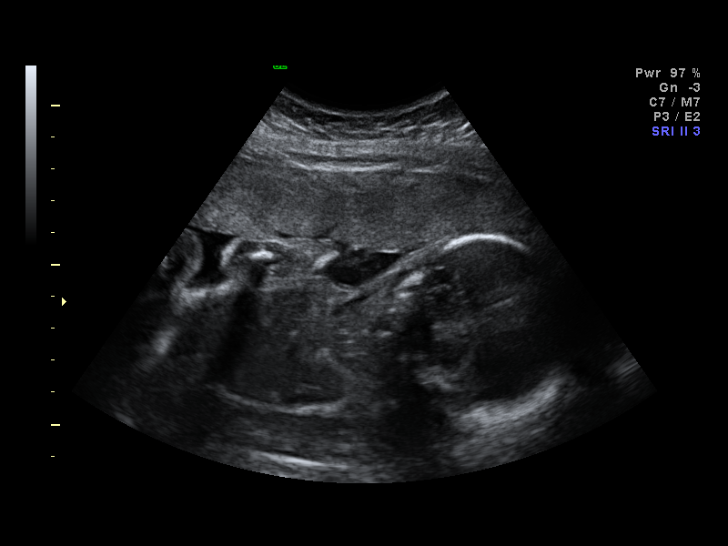
[im 5/41]
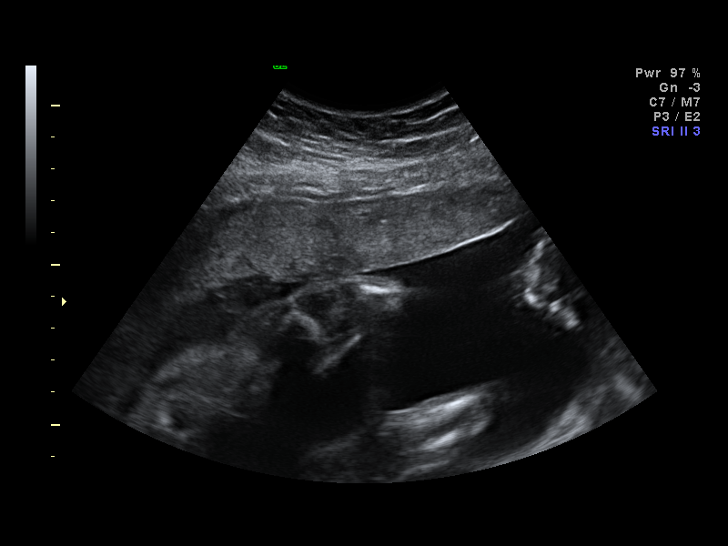
[im 8/41]
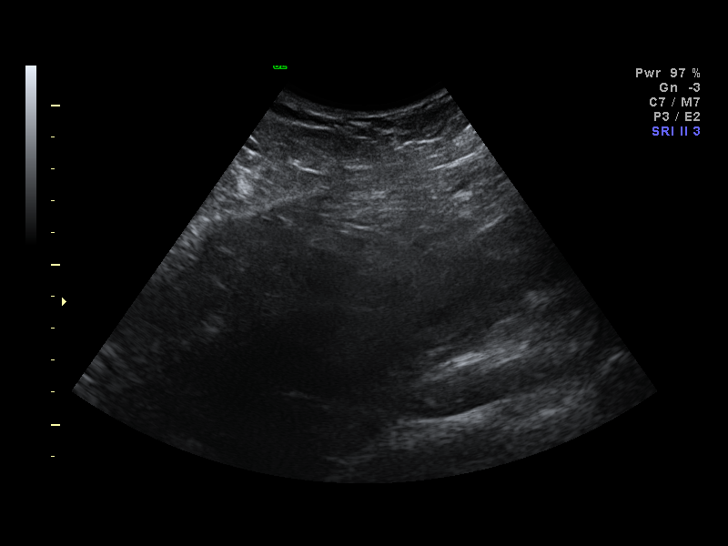
[im 11/41]
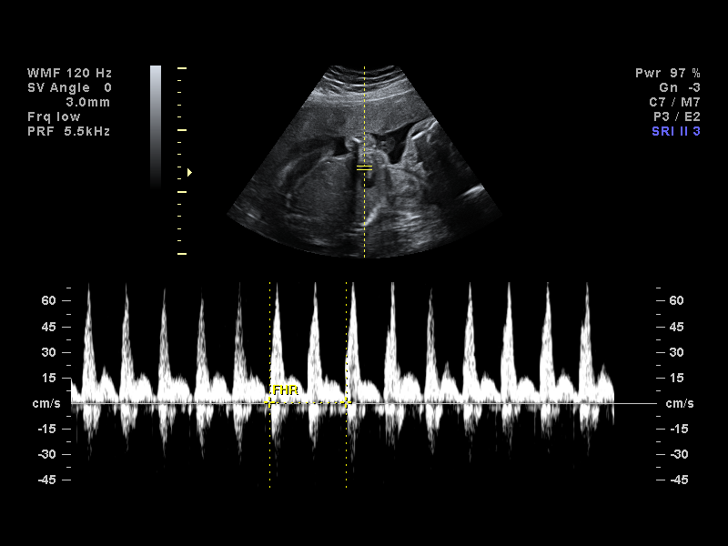
[im 14/41]
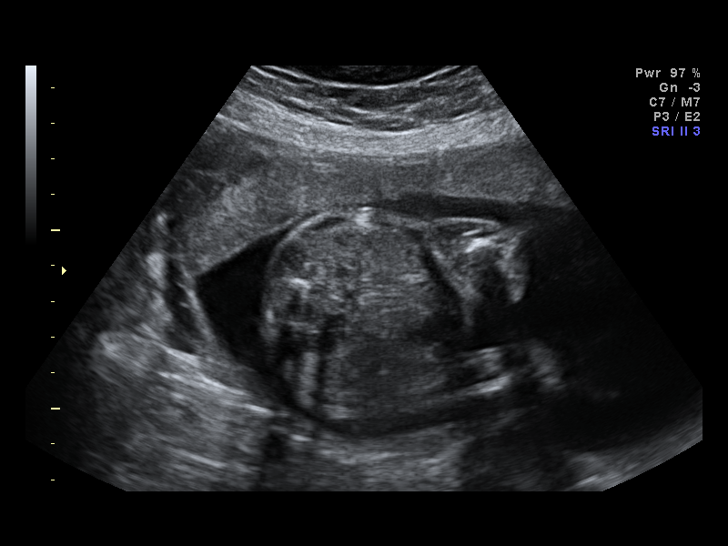
[im 17/41]
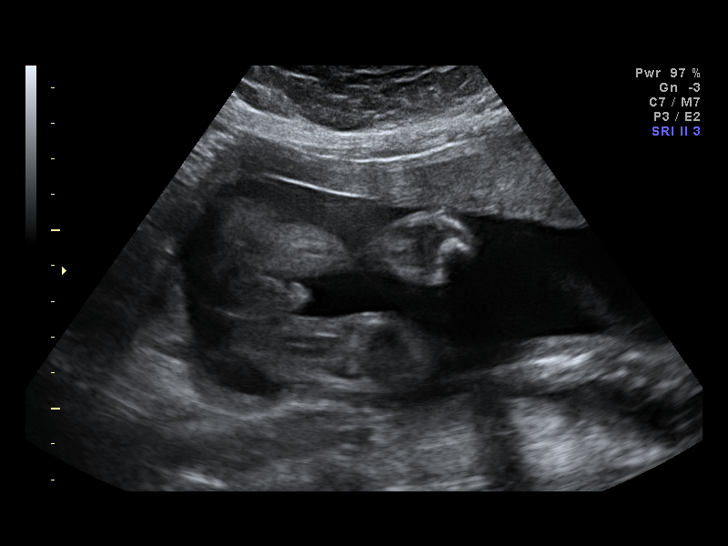
[im 20/41]
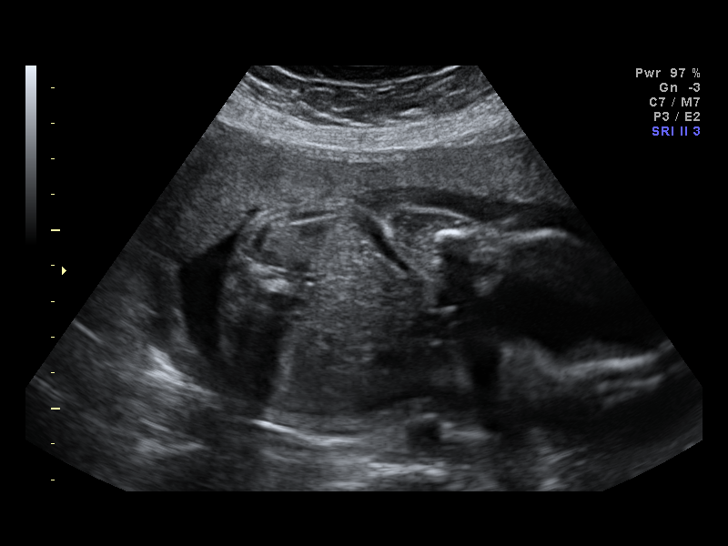
[im 23/41]
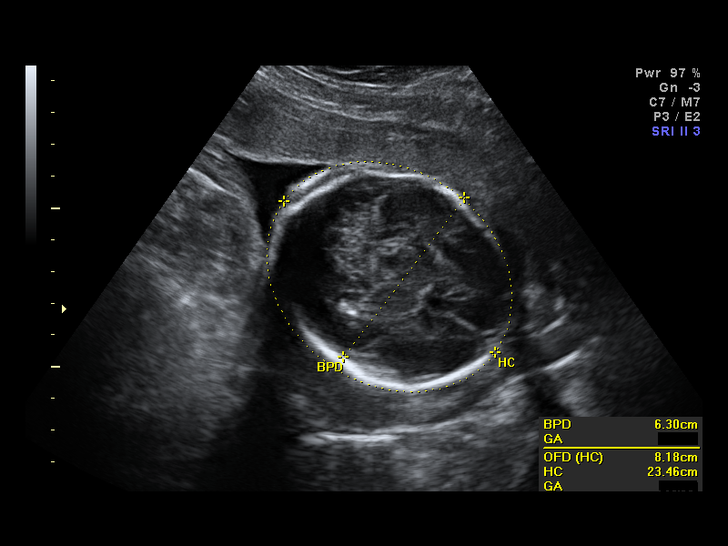
[im 26/41]
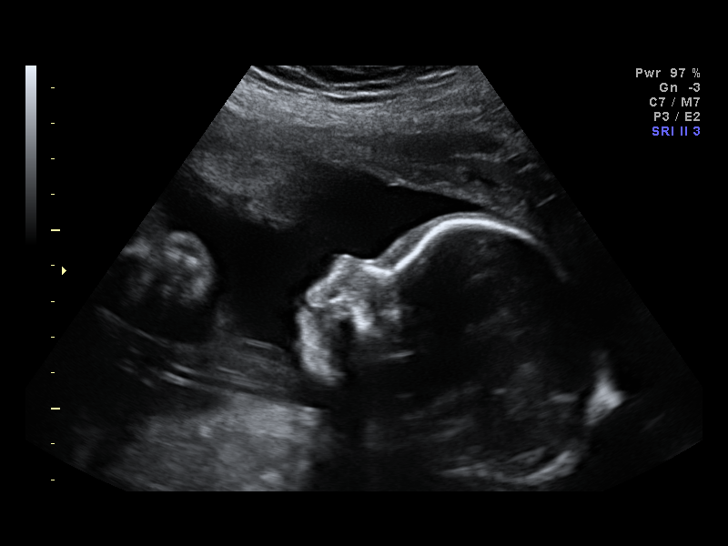
[im 29/41]
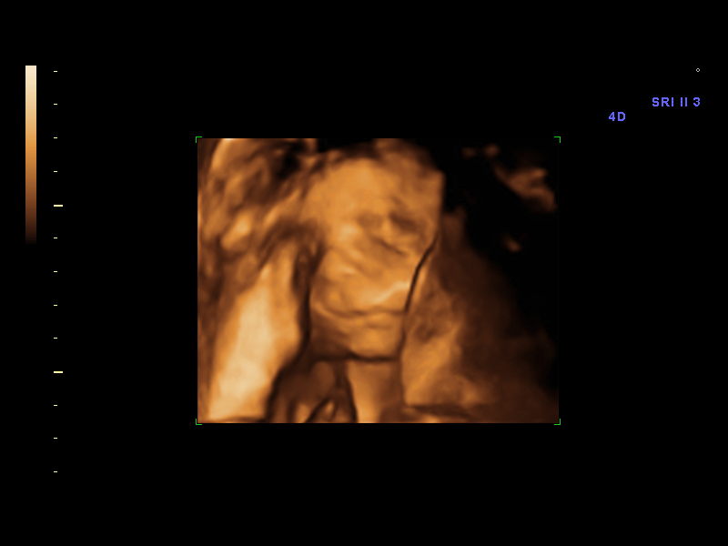
[im 32/41]
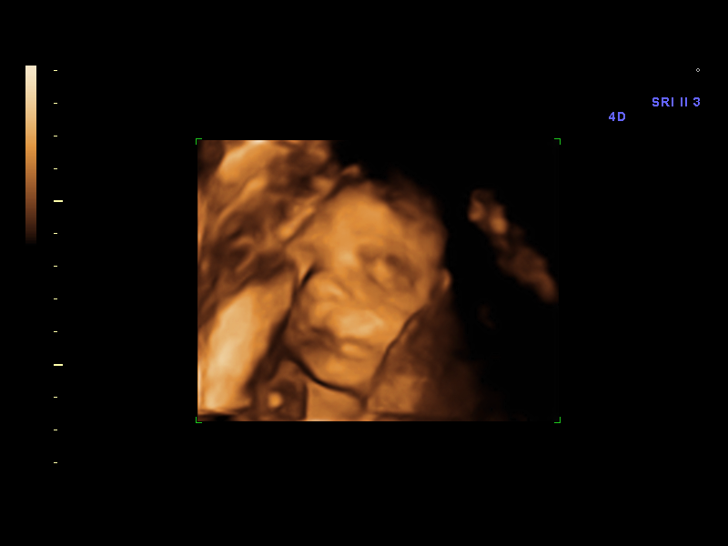
[im 35/41]
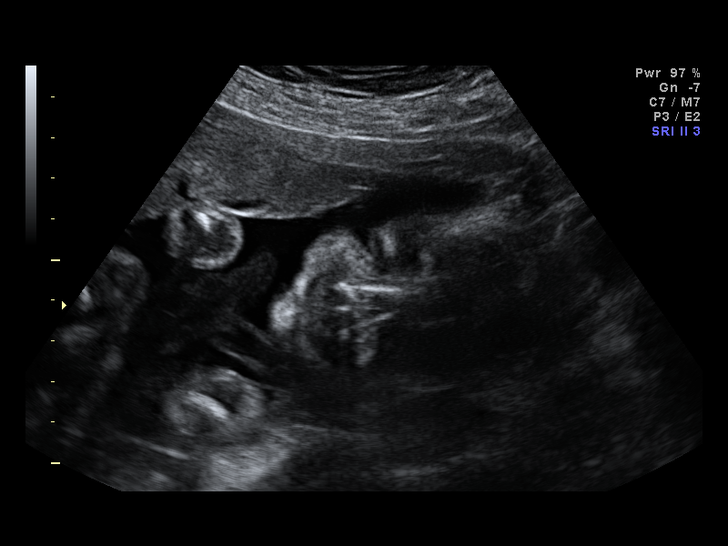
[im 38/41]
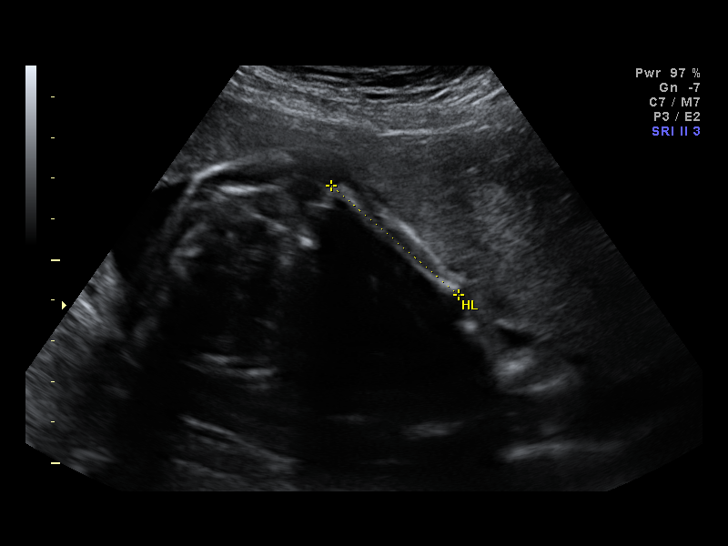
[im 41/41]
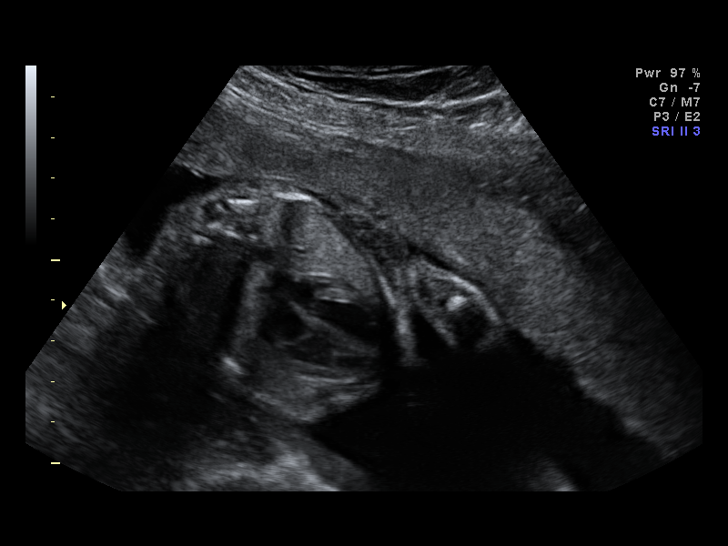

[14 of 28 positions shown; findings below may reference images not displayed]

IMPRESSION: [facility] has also been faxed to the ordering physician.

## 2008-04-20 ENCOUNTER — Encounter: Payer: Self-pay | Admitting: Obstetrics & Gynecology

## 2008-04-20 ENCOUNTER — Ambulatory Visit: Payer: Self-pay | Admitting: Obstetrics & Gynecology

## 2008-04-20 LAB — CONVERTED CEMR LAB
HCT: 31.7 % — ABNORMAL LOW (ref 36.0–46.0)
Hemoglobin: 10.8 g/dL — ABNORMAL LOW (ref 12.0–15.0)
MCV: 89.3 fL (ref 78.0–100.0)
RBC: 3.55 M/uL — ABNORMAL LOW (ref 3.87–5.11)
WBC: 10.9 10*3/uL — ABNORMAL HIGH (ref 4.0–10.5)

## 2008-05-11 ENCOUNTER — Ambulatory Visit: Payer: Self-pay | Admitting: Family Medicine

## 2008-05-12 ENCOUNTER — Ambulatory Visit (HOSPITAL_COMMUNITY): Admission: RE | Admit: 2008-05-12 | Discharge: 2008-05-12 | Payer: Self-pay | Admitting: Obstetrics & Gynecology

## 2008-05-12 IMAGING — US US OB FOLLOW-UP
1 series · 18 of 28 positions shown · non-contrast
Comparison: none

OBSTETRICAL ULTRASOUND:
 This ultrasound was performed in The [HOSPITAL], and the [facility] OB/GY[facility] report will be stored to [REDACTED] PACS.

[Series 1: us ob follow-up · 18 of 37 slices shown]
[im 1/37]
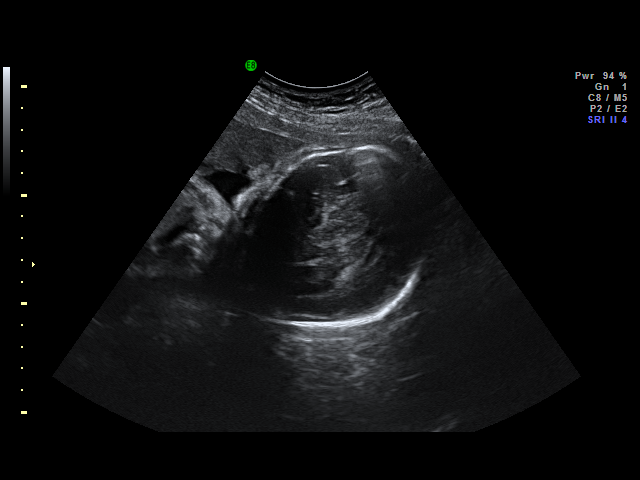
[im 3/37]
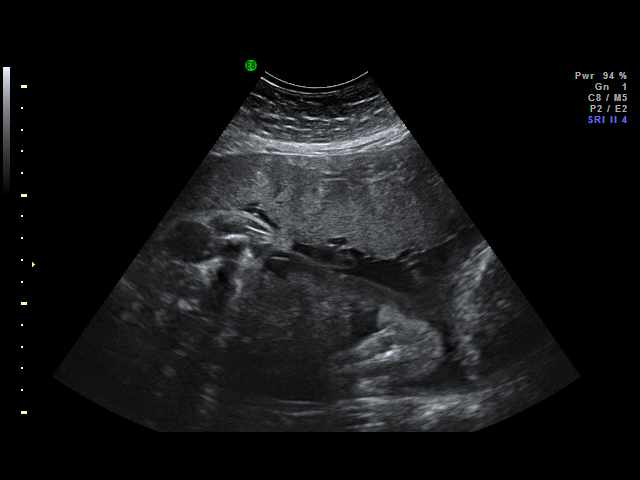
[im 5/37]
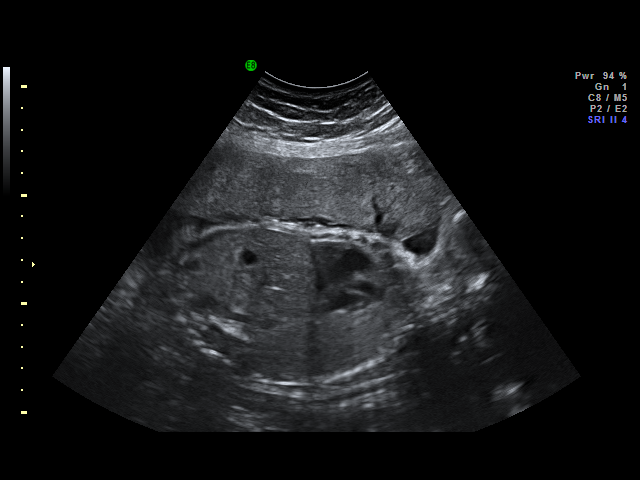
[im 7/37]
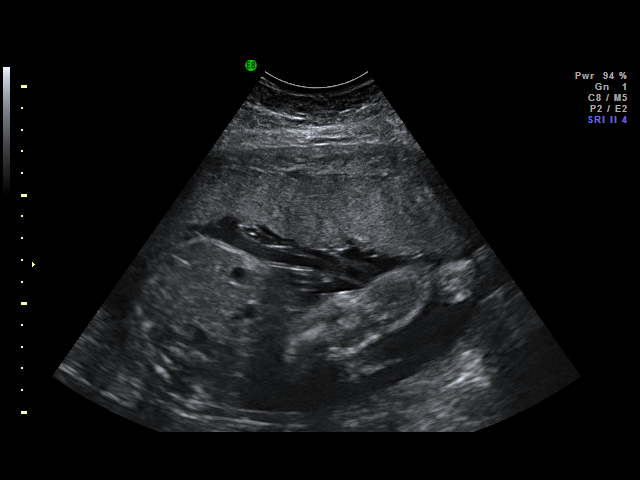
[im 10/37]
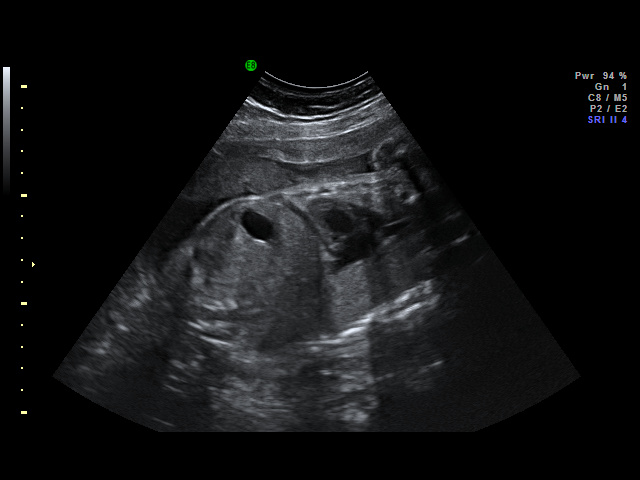
[im 11/37]
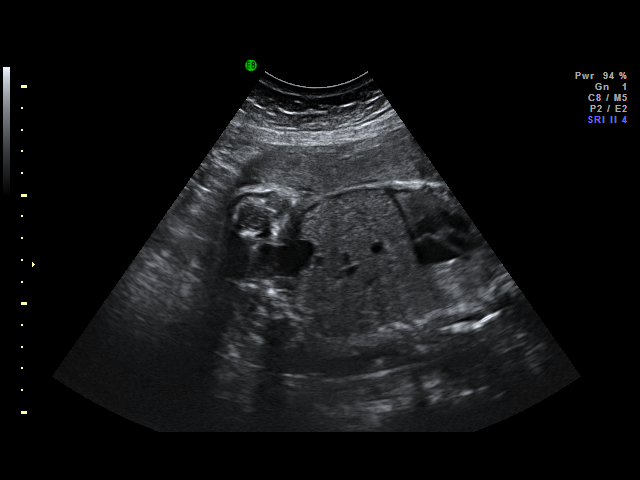
[im 14/37]
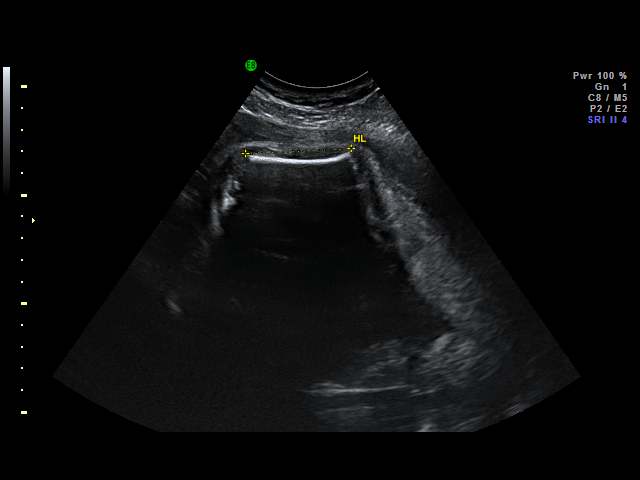
[im 15/37]
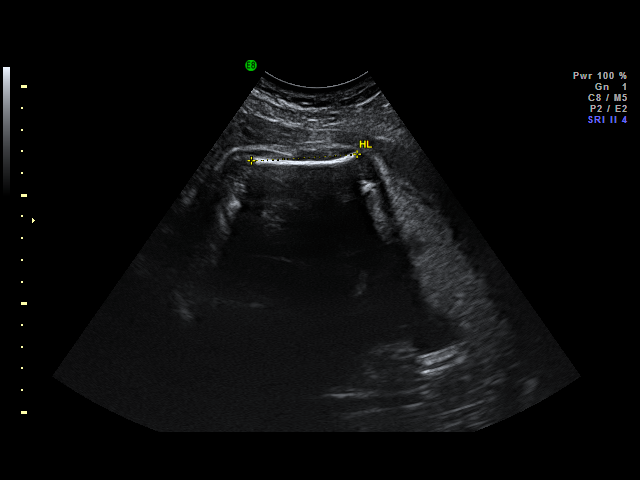
[im 18/37]
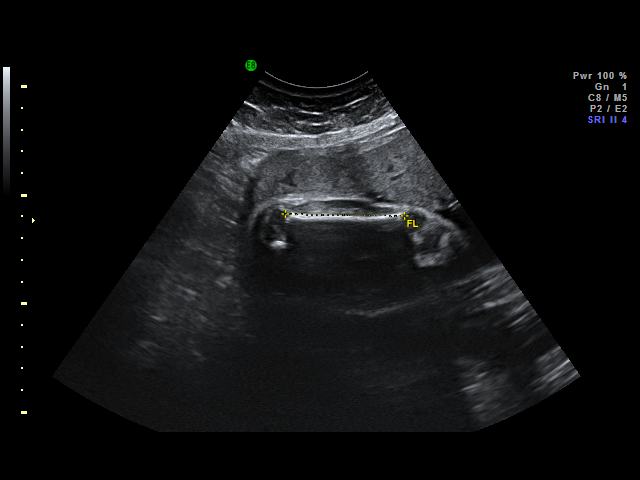
[im 19/37]
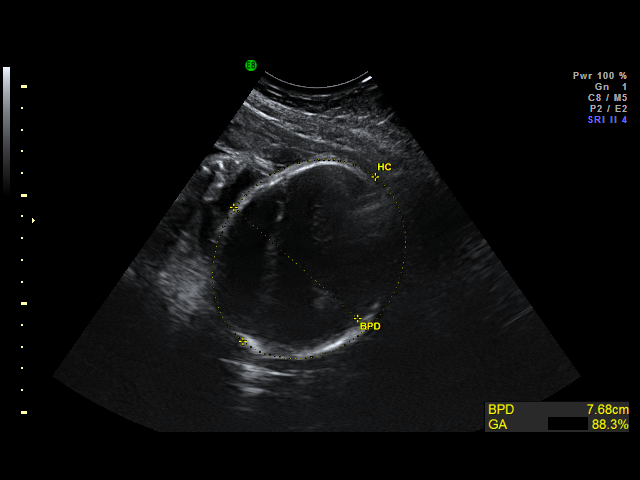
[im 22/37]
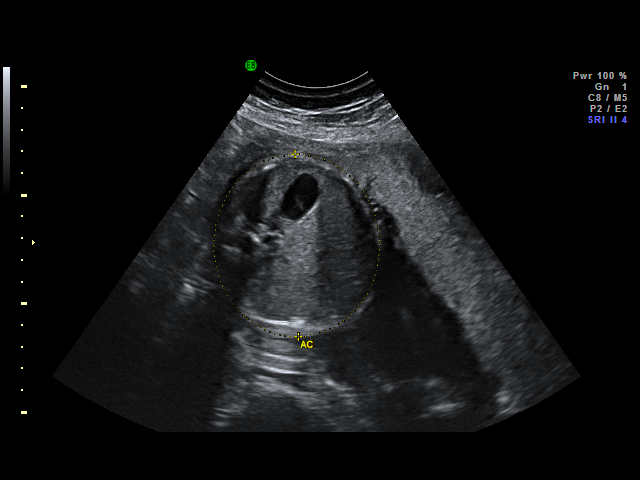
[im 23/37]
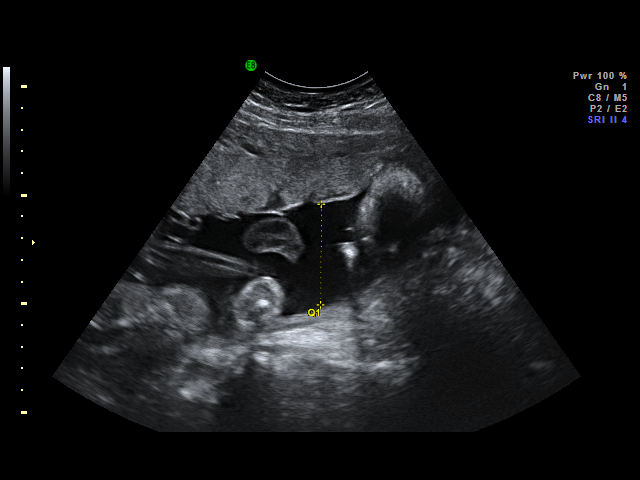
[im 26/37]
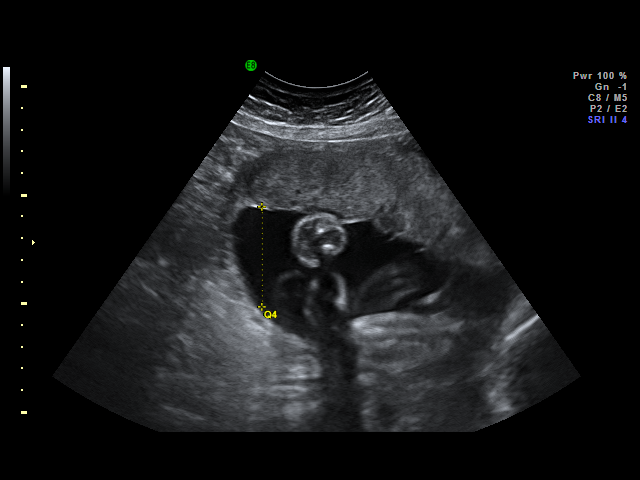
[im 29/37]
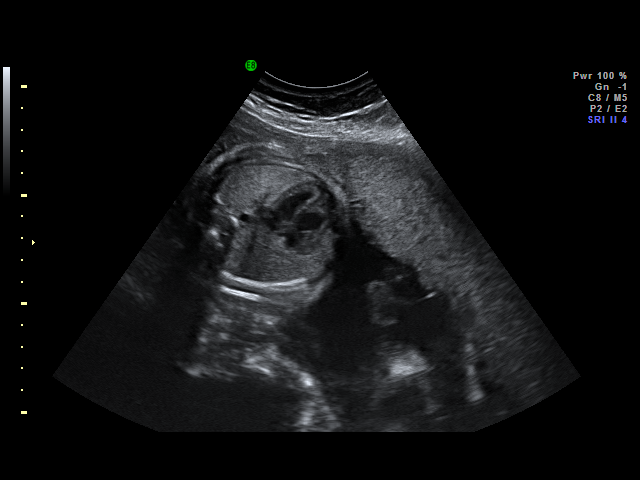
[im 30/37]
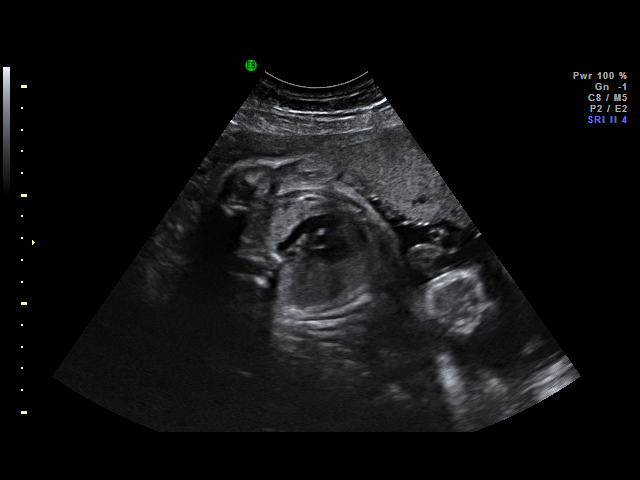
[im 33/37]
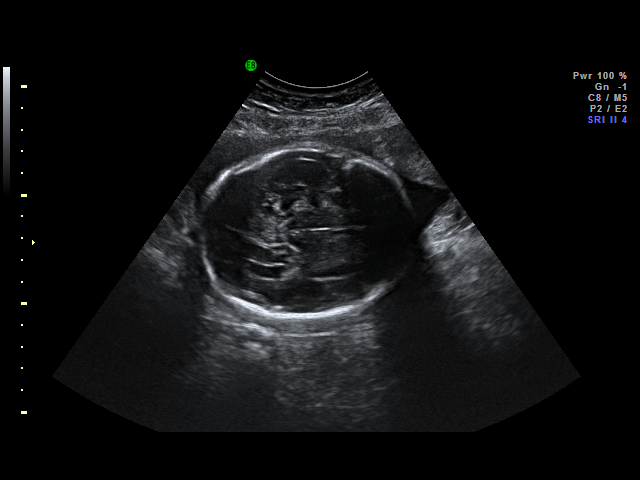
[im 34/37]
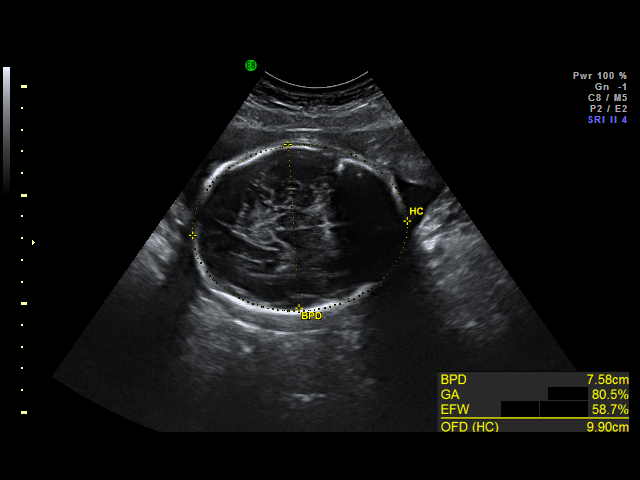
[im 37/37]
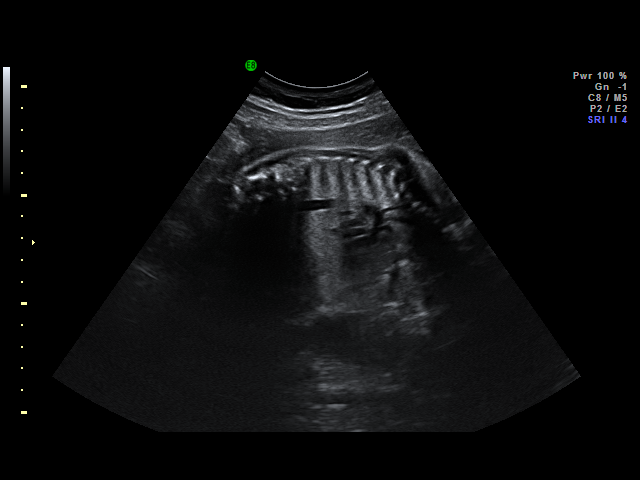

[18 of 28 positions shown; findings below may reference images not displayed]

IMPRESSION: [facility]N has also been faxed to the ordering physician.

## 2008-05-25 ENCOUNTER — Ambulatory Visit: Payer: Self-pay | Admitting: Obstetrics & Gynecology

## 2008-06-01 ENCOUNTER — Ambulatory Visit: Payer: Self-pay | Admitting: Family Medicine

## 2008-06-03 ENCOUNTER — Ambulatory Visit: Payer: Self-pay | Admitting: Obstetrics & Gynecology

## 2008-06-04 ENCOUNTER — Ambulatory Visit (HOSPITAL_COMMUNITY): Admission: RE | Admit: 2008-06-04 | Discharge: 2008-06-04 | Payer: Self-pay | Admitting: Obstetrics & Gynecology

## 2008-06-04 IMAGING — US US FETAL BPP W/O NONSTRESS
1 series · 13 of 13 positions shown · non-contrast
Comparison: none

OBSTETRICAL ULTRASOUND:
 This ultrasound exam was performed in the [HOSPITAL] Ultrasound Department.  The OB US report was generated in the AS system, and faxed to the ordering physician.  This report is also available in [REDACTED] PACS.

[Series 1: us fetal bpp w/o nonstress · 0.30mm/px · 13 acquisitions, 13 frames shown]
[im 1/13]
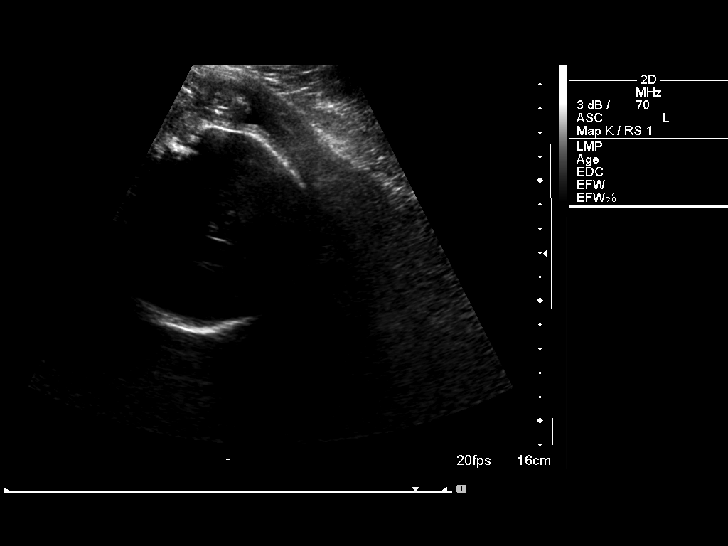
[im 2/13]
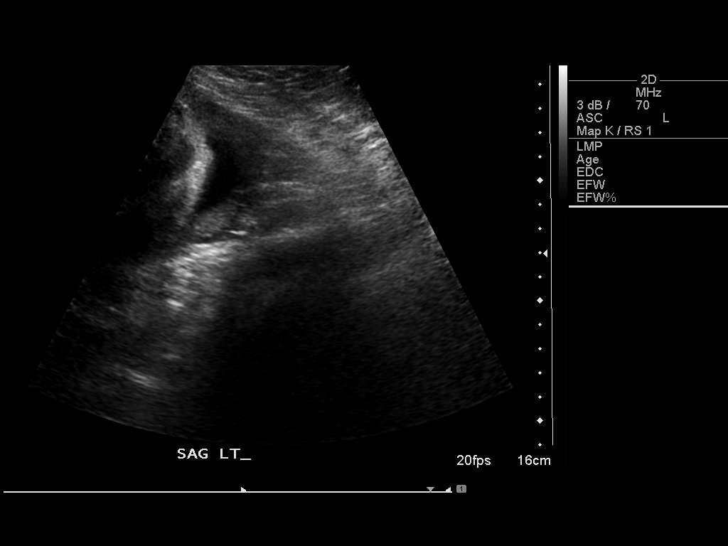
[im 3/13]
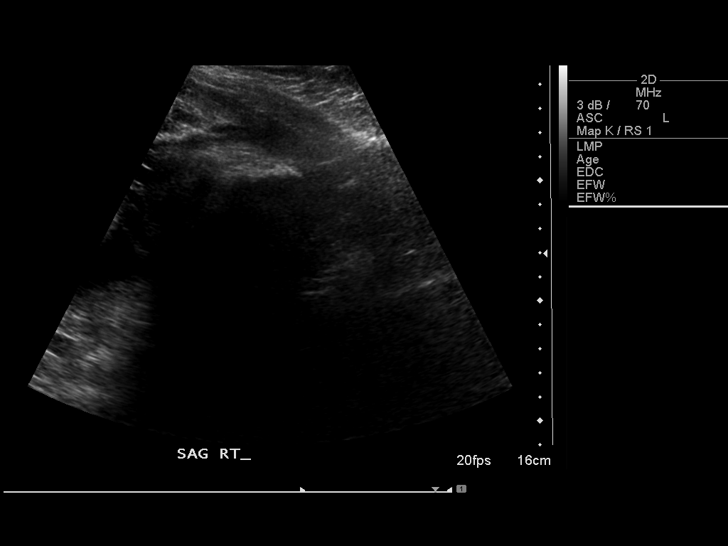
[im 4/13]
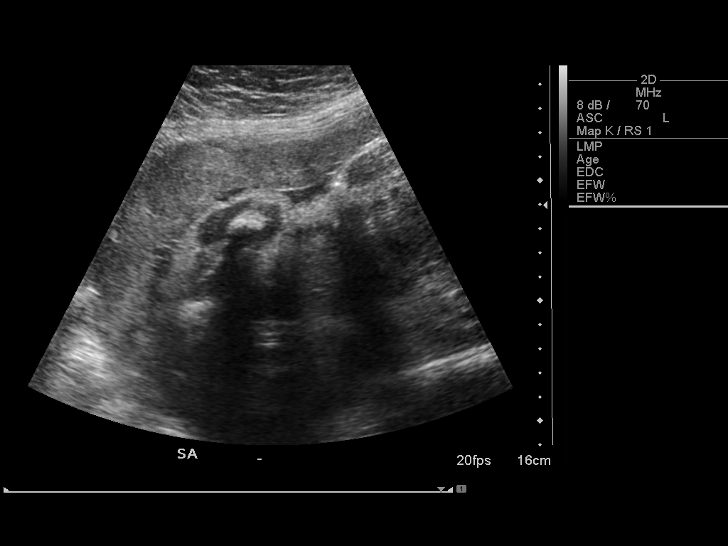
[im 5/13]
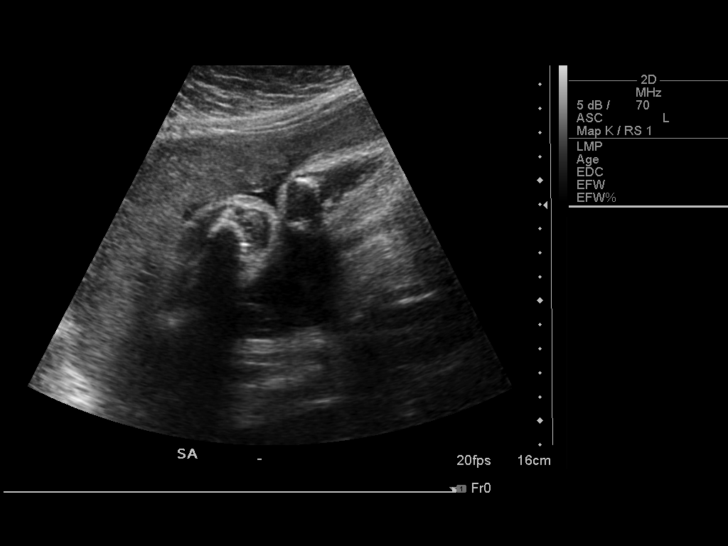
[im 6/13]
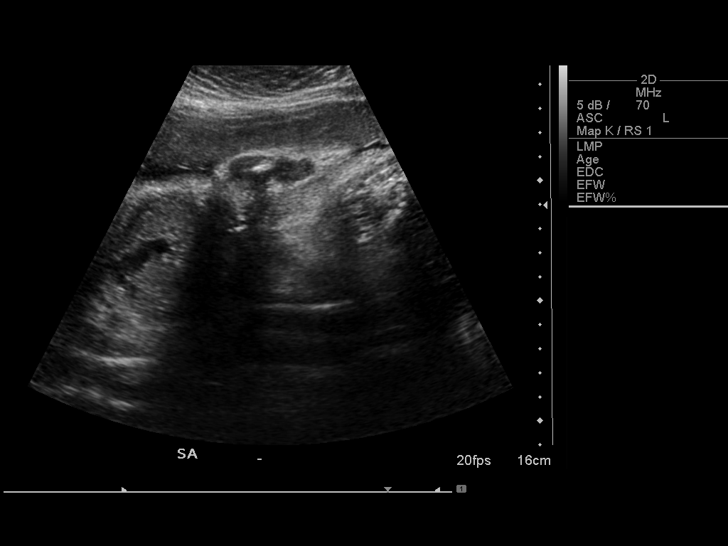
[im 7/13]
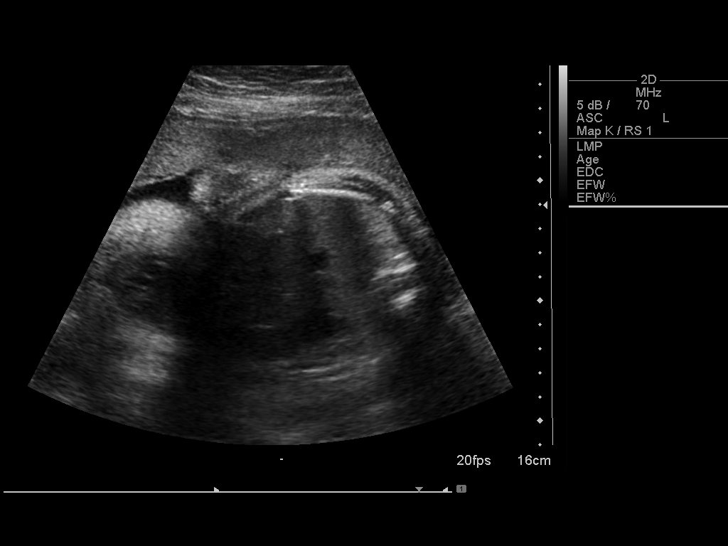
[im 8/13]
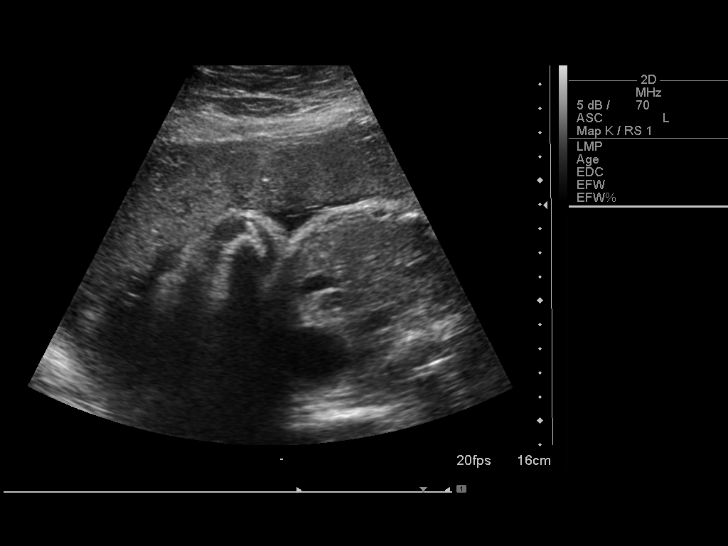
[im 9/13]
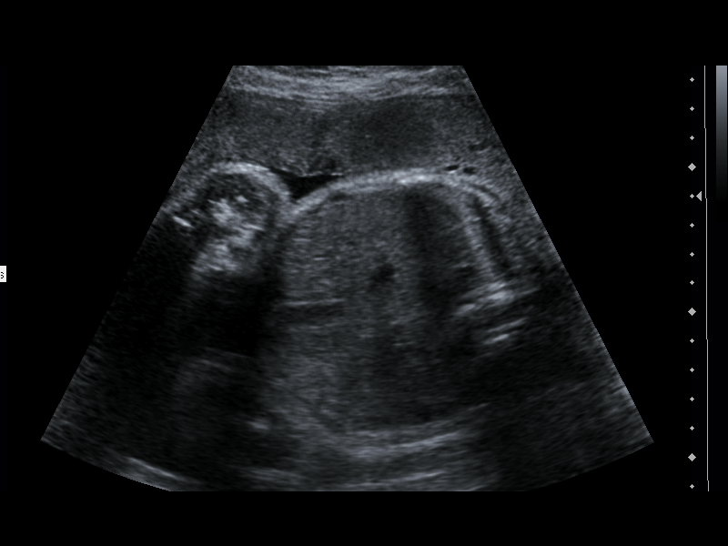
[im 10/13]
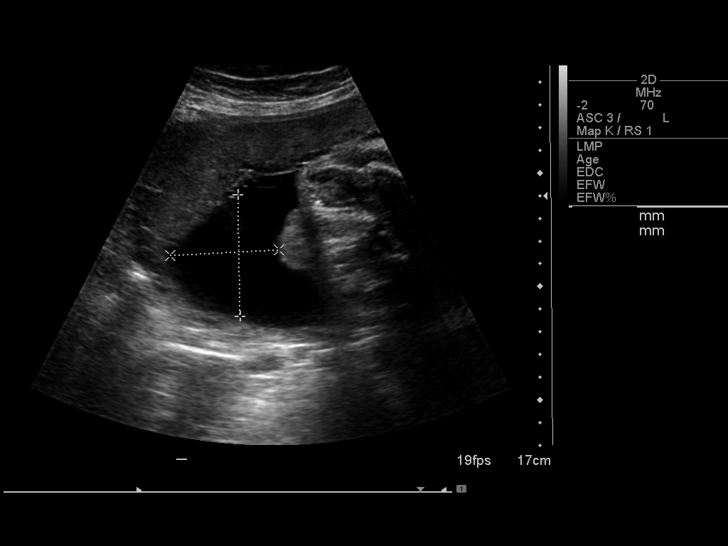
[im 11/13]
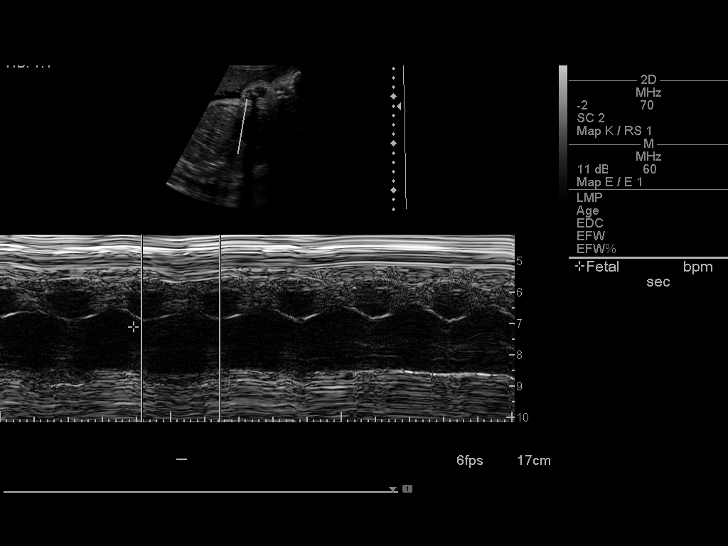
[im 12/13]
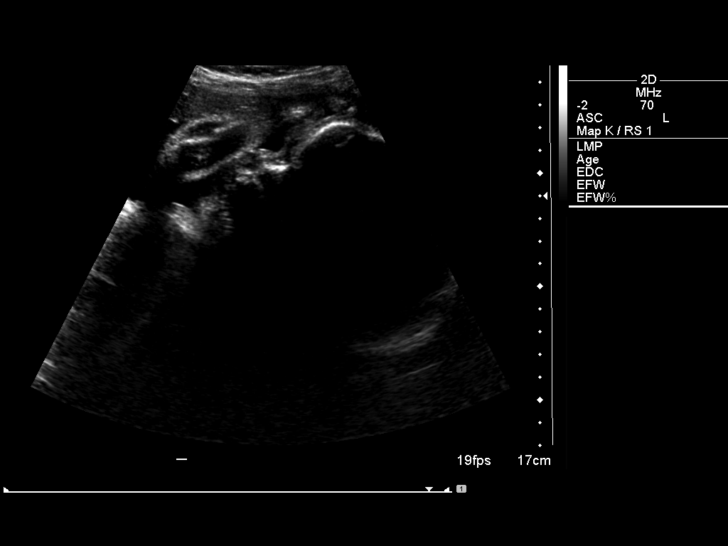
[im 13/13]
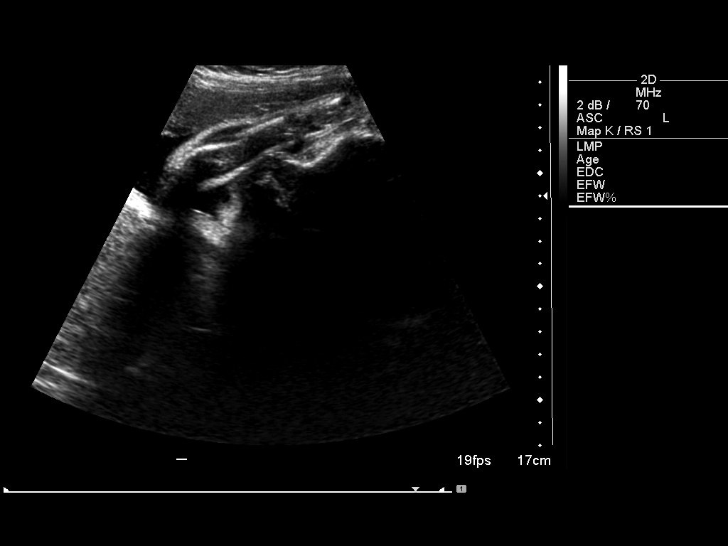

[13 of 13 positions shown; findings below may reference images not displayed]

IMPRESSION: See AS Obstetric US report.

## 2008-06-08 ENCOUNTER — Ambulatory Visit: Payer: Self-pay | Admitting: Obstetrics & Gynecology

## 2008-06-11 ENCOUNTER — Ambulatory Visit (HOSPITAL_COMMUNITY): Admission: RE | Admit: 2008-06-11 | Discharge: 2008-06-11 | Payer: Self-pay | Admitting: Obstetrics & Gynecology

## 2008-06-11 IMAGING — US US OB FOLLOW-UP
1 series · 18 of 28 positions shown · non-contrast
Comparison: none

OBSTETRICAL ULTRASOUND:
 This ultrasound was performed in The [HOSPITAL], and the AS OB/GYN report will be stored to [REDACTED] PACS.

[Series 1: us ob follow-up · 18 of 42 slices shown]
[im 1/42]
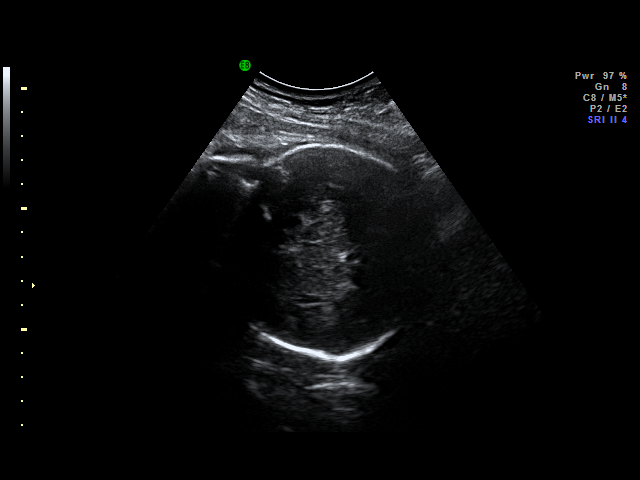
[im 4/42]
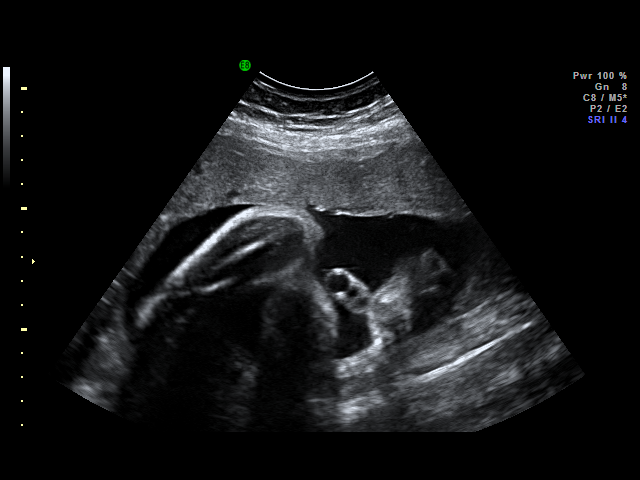
[im 5/42]
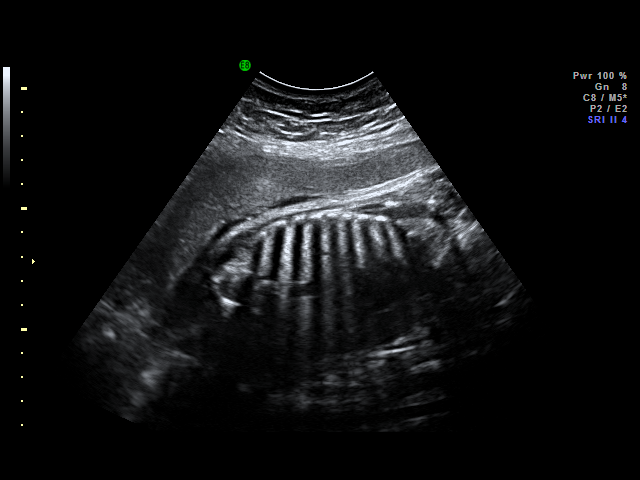
[im 8/42]
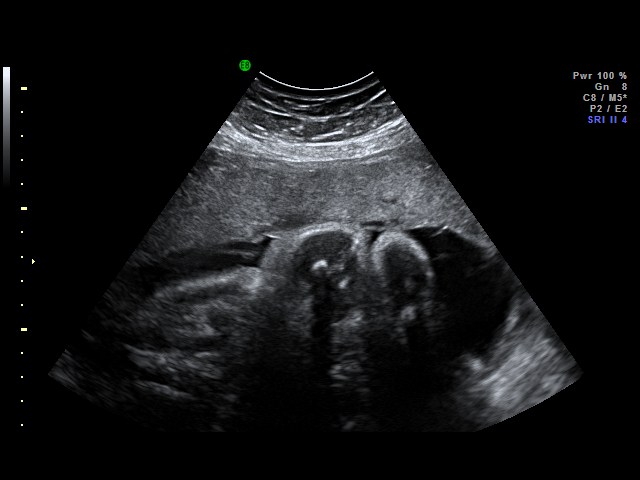
[im 11/42]
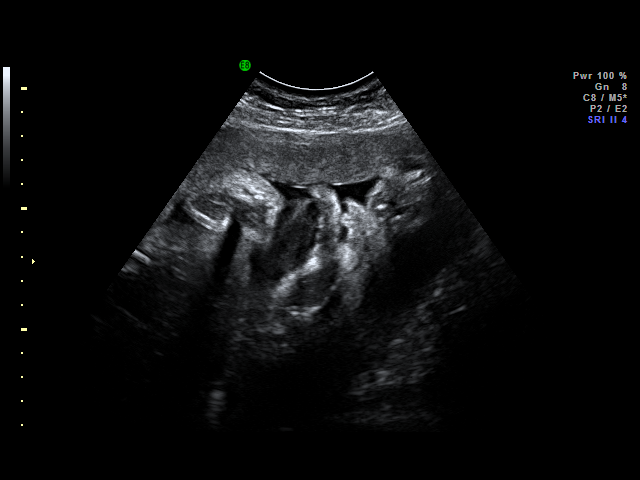
[im 13/42]
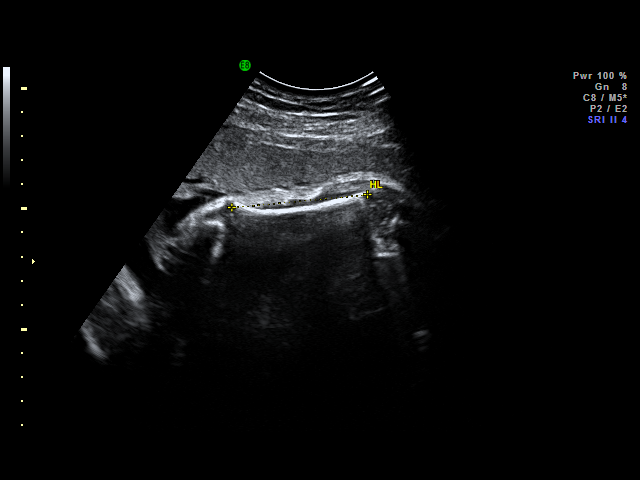
[im 16/42]
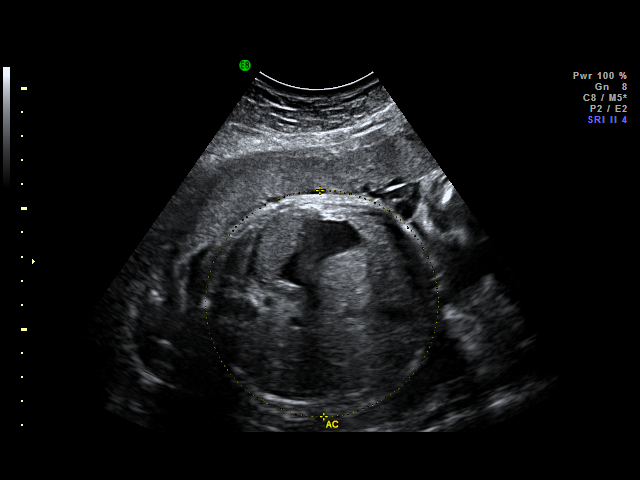
[im 17/42]
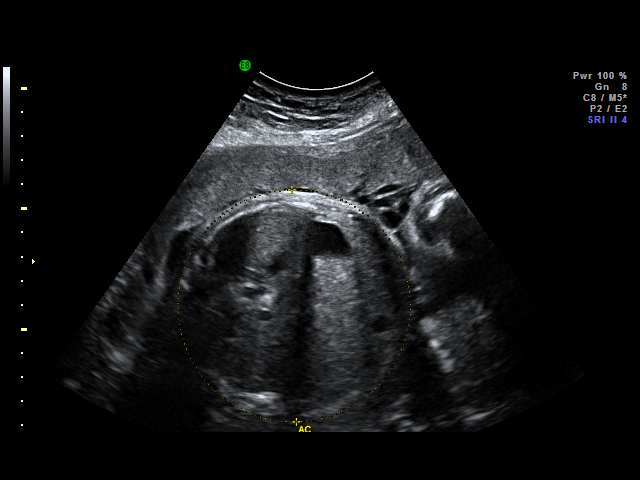
[im 20/42]
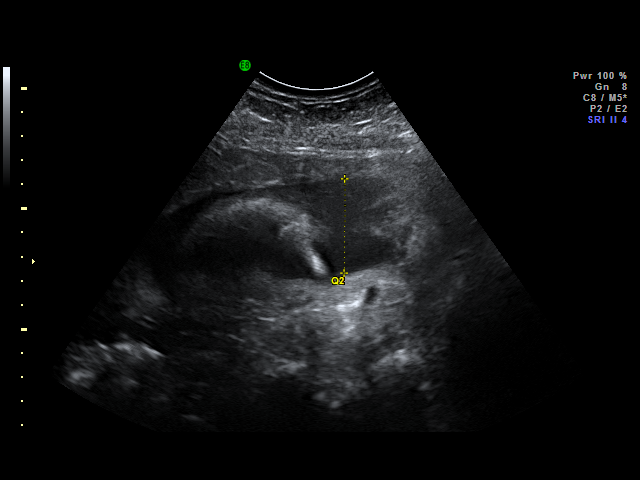
[im 22/42]
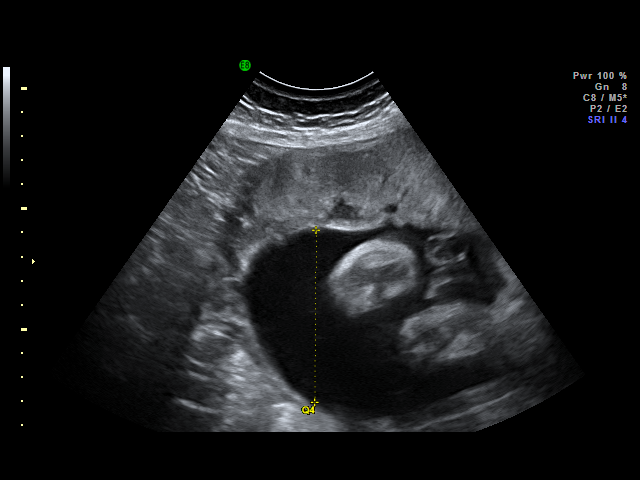
[im 25/42]
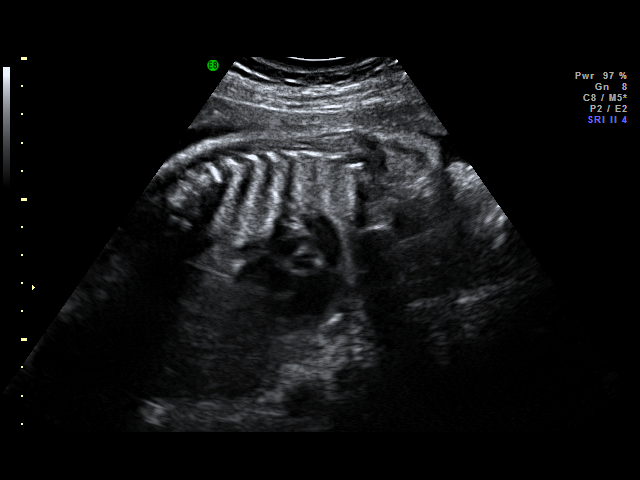
[im 26/42]
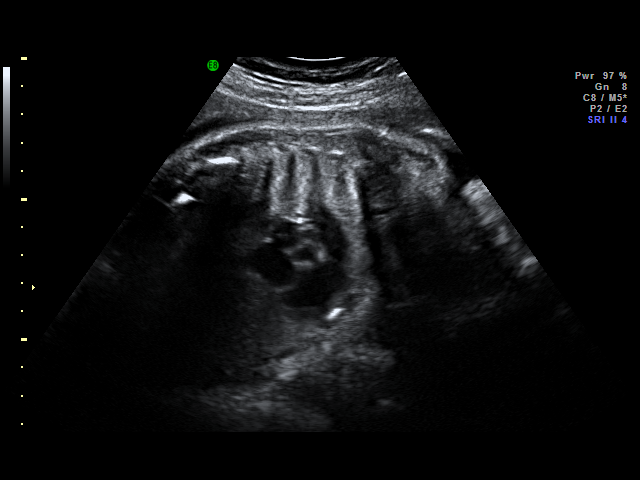
[im 29/42]
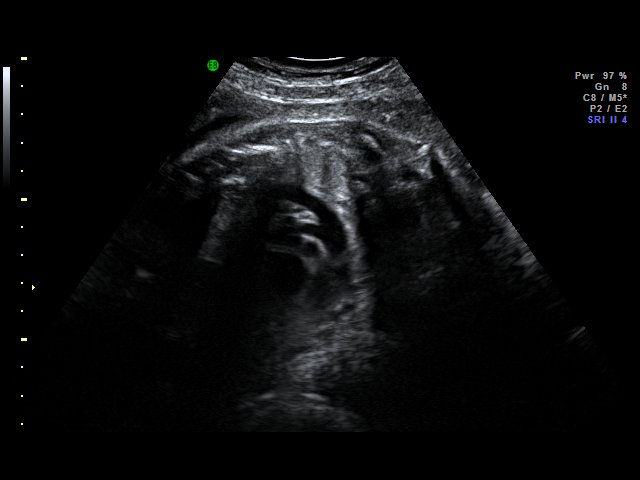
[im 32/42]
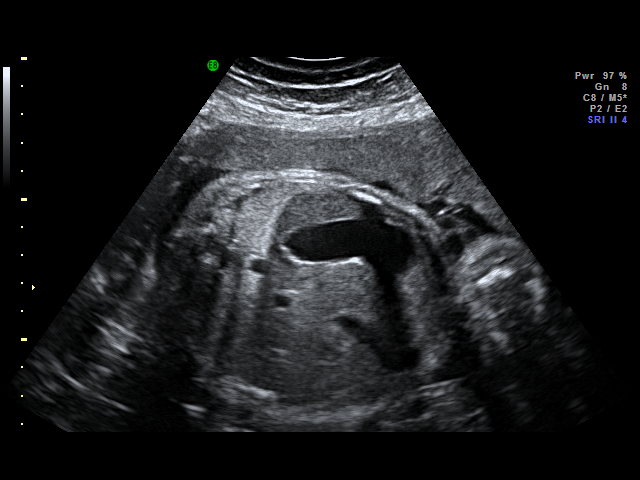
[im 34/42]
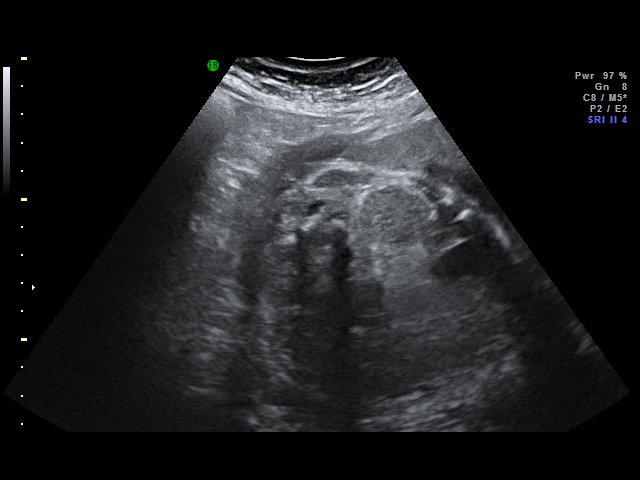
[im 37/42]
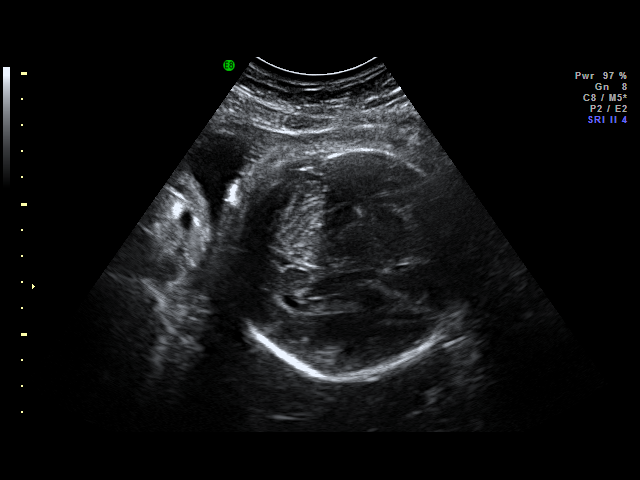
[im 38/42]
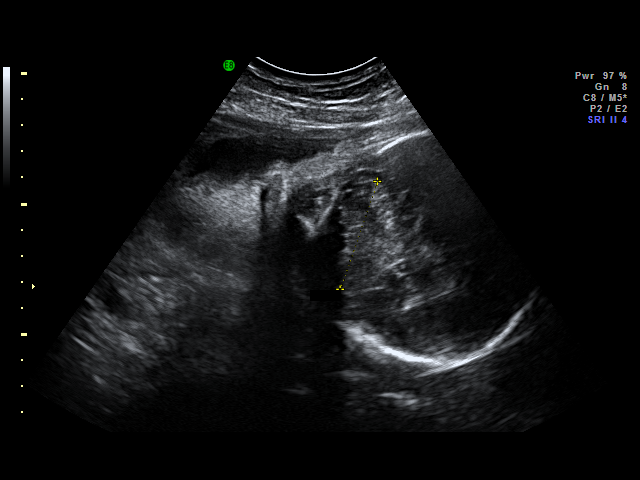
[im 42/42]
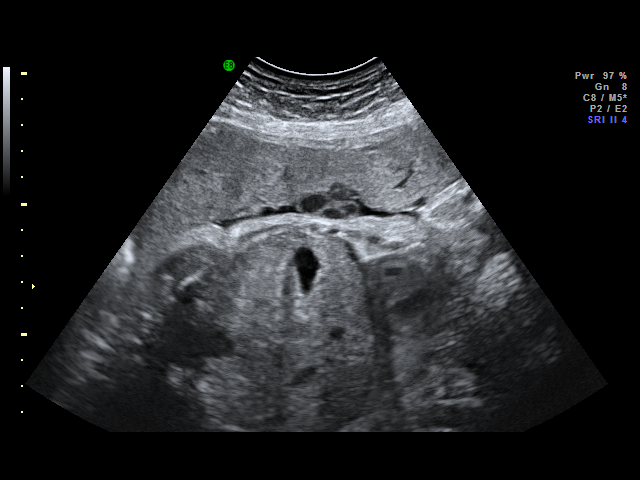

[18 of 28 positions shown; findings below may reference images not displayed]

IMPRESSION: AS OB/GYN has also been faxed to the ordering physician.

## 2008-06-14 ENCOUNTER — Ambulatory Visit: Payer: Self-pay | Admitting: Obstetrics and Gynecology

## 2008-06-16 ENCOUNTER — Ambulatory Visit: Payer: Self-pay | Admitting: Obstetrics and Gynecology

## 2008-06-21 ENCOUNTER — Ambulatory Visit: Payer: Self-pay | Admitting: Obstetrics and Gynecology

## 2008-06-23 ENCOUNTER — Ambulatory Visit (HOSPITAL_COMMUNITY): Admission: RE | Admit: 2008-06-23 | Discharge: 2008-06-23 | Payer: Self-pay | Admitting: Obstetrics and Gynecology

## 2008-06-23 IMAGING — US US FETAL BPP W/O NONSTRESS
1 series · 4 of 4 positions shown · non-contrast
Comparison: none

OBSTETRICAL ULTRASOUND:
 This ultrasound was performed in The [HOSPITAL], and the AS OB/GYN report will be stored to [REDACTED] PACS.

[Series 1: us fetal bpp w/o nonstress · 4 acquisitions, 4 frames shown]
[im 1/4]
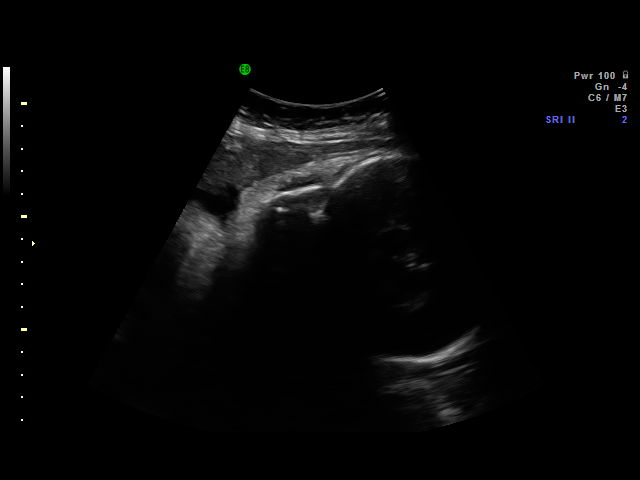
[im 2/4]
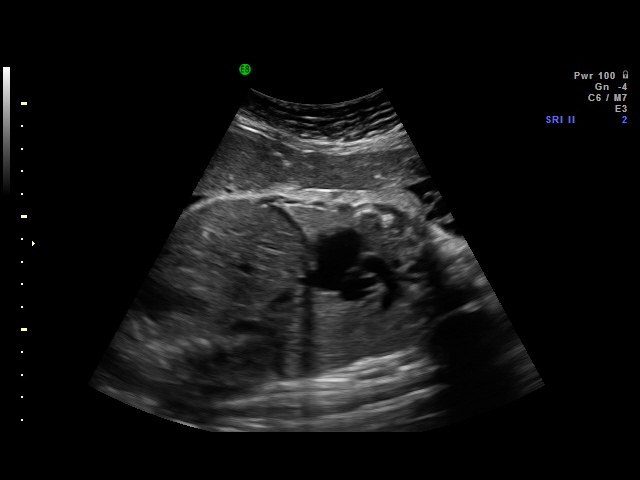
[im 3/4]
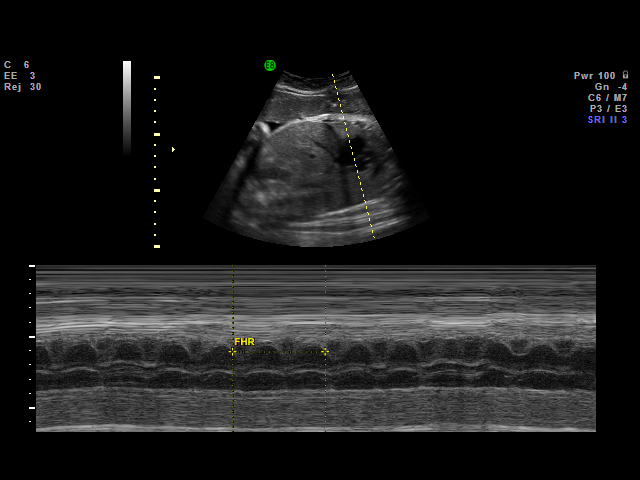
[im 4/4]
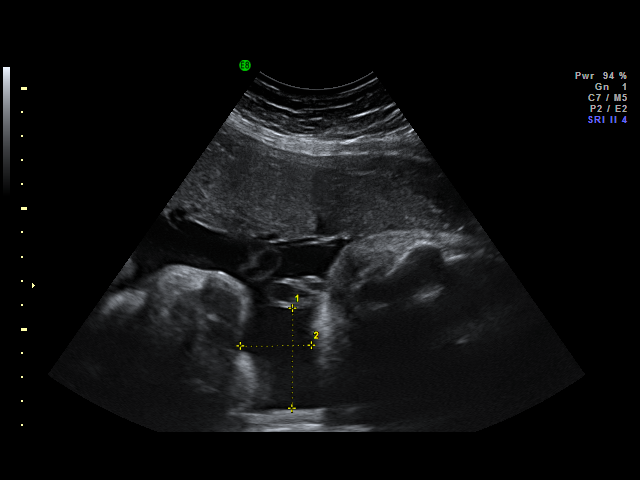

[4 of 4 positions shown; findings below may reference images not displayed]

IMPRESSION: AS OB/GYN has also been faxed to the ordering physician.

## 2008-06-28 ENCOUNTER — Encounter: Payer: Self-pay | Admitting: Obstetrics & Gynecology

## 2008-06-28 ENCOUNTER — Ambulatory Visit: Payer: Self-pay | Admitting: Family Medicine

## 2008-06-28 LAB — CONVERTED CEMR LAB: GC Probe Amp, Genital: NEGATIVE

## 2008-06-29 ENCOUNTER — Encounter: Payer: Self-pay | Admitting: Obstetrics & Gynecology

## 2008-07-01 ENCOUNTER — Ambulatory Visit (HOSPITAL_COMMUNITY): Admission: RE | Admit: 2008-07-01 | Discharge: 2008-07-01 | Payer: Self-pay | Admitting: Obstetrics and Gynecology

## 2008-07-01 IMAGING — US US FETAL BPP W/O NONSTRESS
1 series · 9 of 9 positions shown · non-contrast
Comparison: none

OBSTETRICAL ULTRASOUND:
 This ultrasound was performed in The [HOSPITAL], and the AS OB/GYN report will be stored to [REDACTED] PACS.

[Series 1: us fetal bpp w/o nonstress · 9 of 9 slices shown]
[im 1/9]
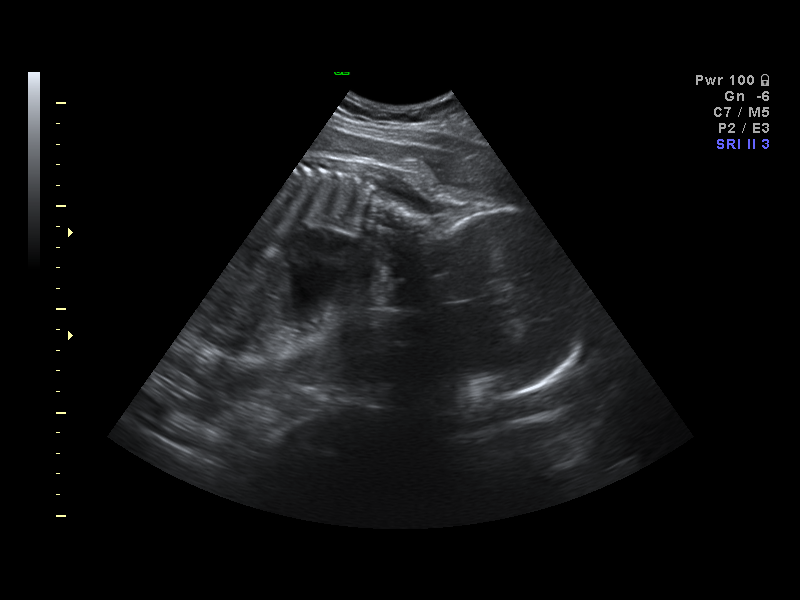
[im 2/9]
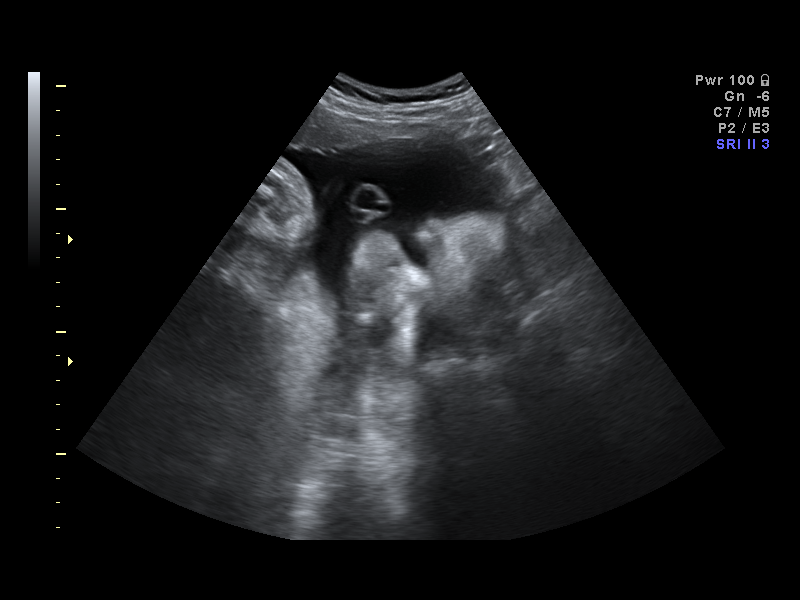
[im 3/9]
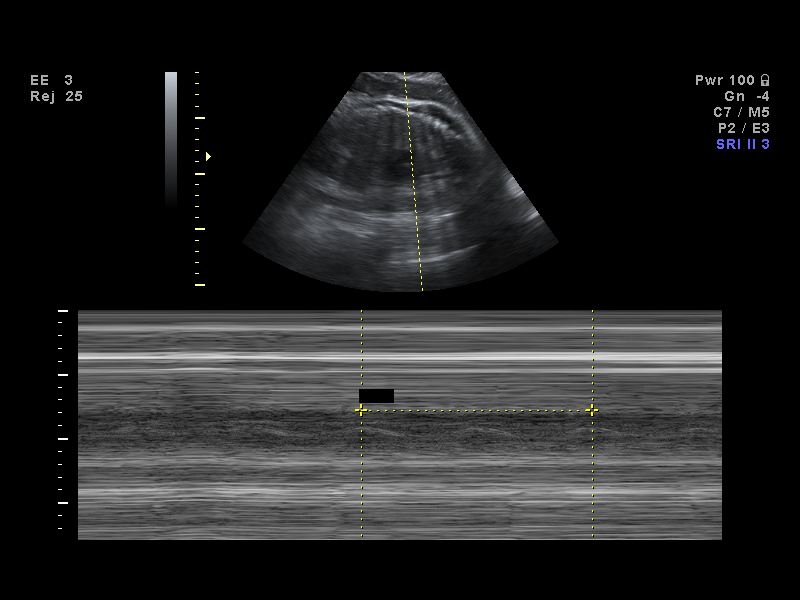
[im 4/9]
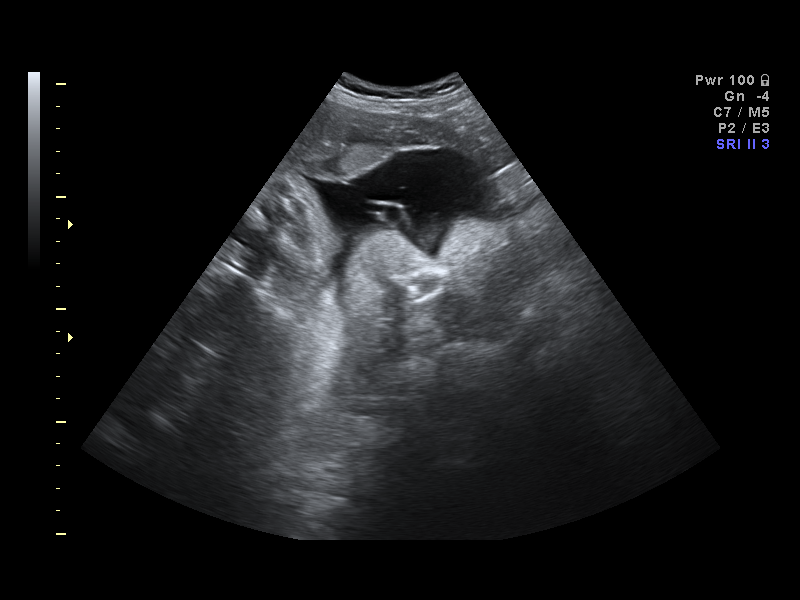
[im 5/9]
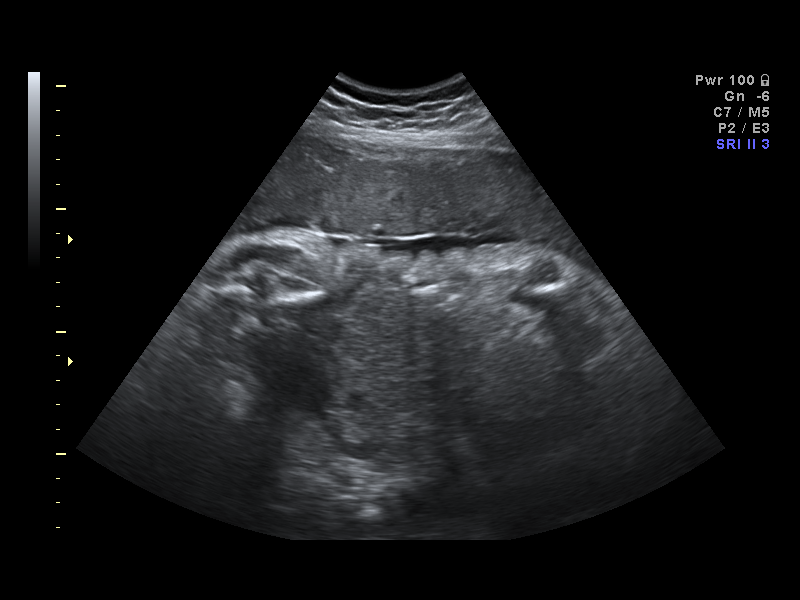
[im 6/9]
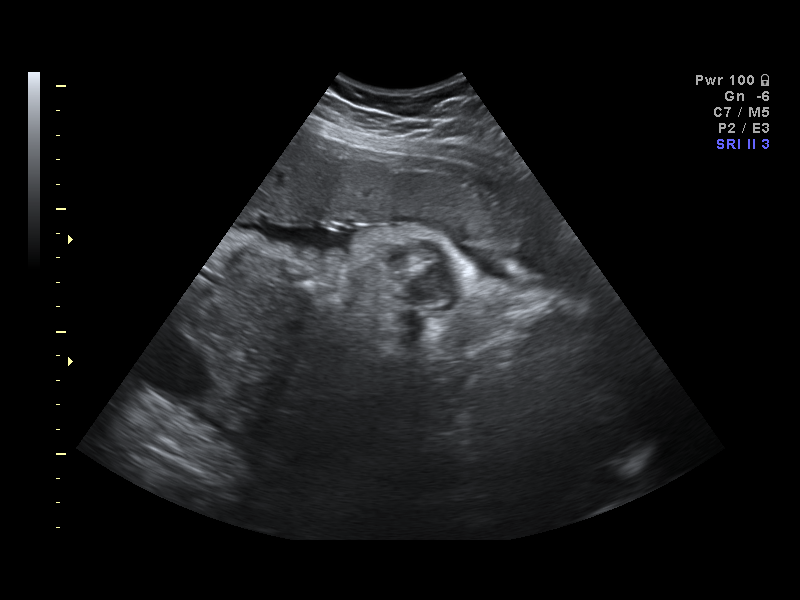
[im 7/9]
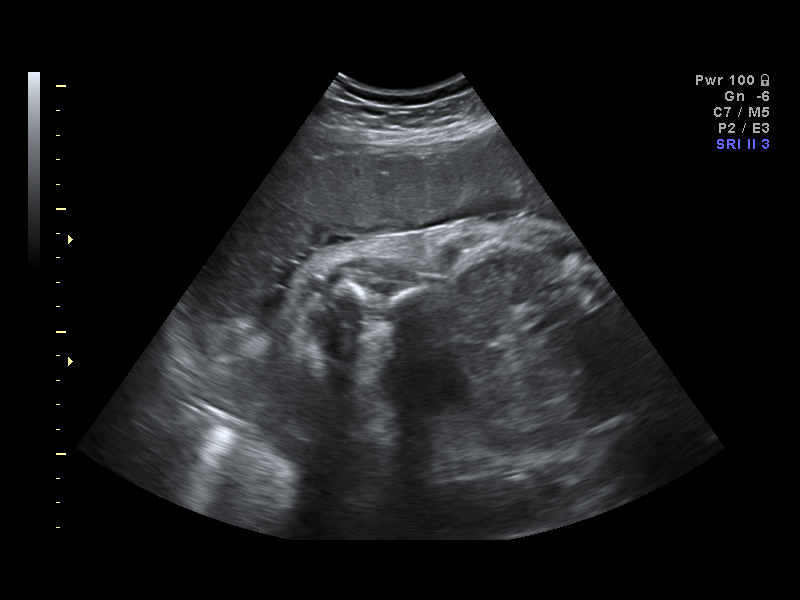
[im 8/9]
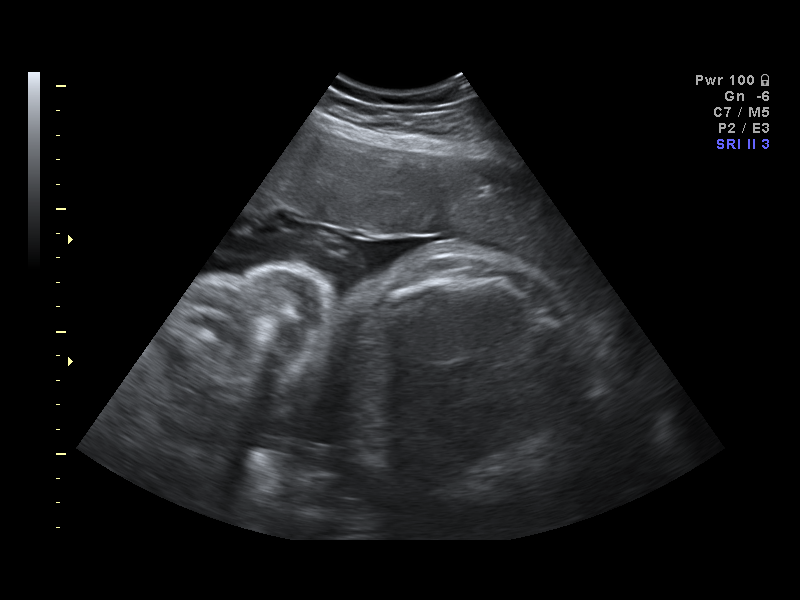
[im 9/9]
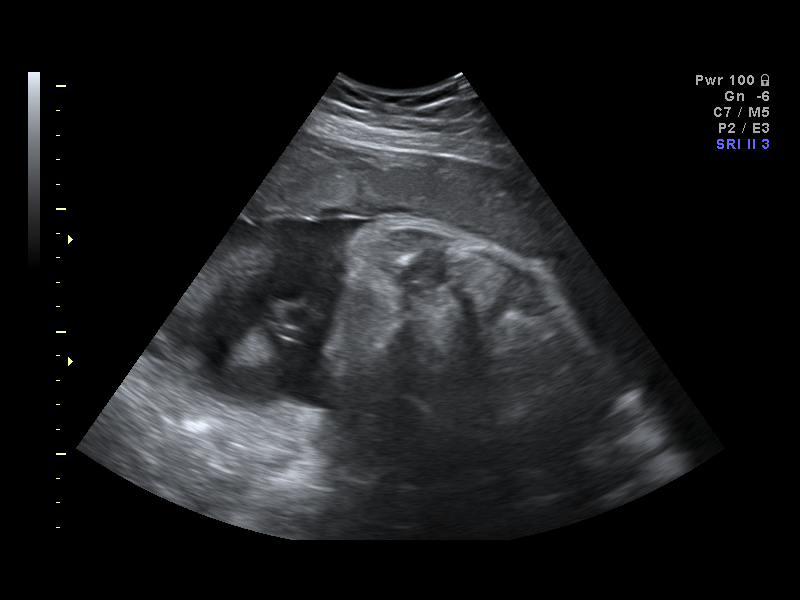

[9 of 9 positions shown; findings below may reference images not displayed]

IMPRESSION: AS OB/GYN has also been faxed to the ordering physician.

## 2008-07-05 ENCOUNTER — Ambulatory Visit: Payer: Self-pay | Admitting: Obstetrics & Gynecology

## 2008-07-06 ENCOUNTER — Ambulatory Visit (HOSPITAL_COMMUNITY): Admission: RE | Admit: 2008-07-06 | Discharge: 2008-07-06 | Payer: Self-pay | Admitting: Family Medicine

## 2008-07-06 IMAGING — US US OB FOLLOW-UP
1 series · 14 of 27 positions shown · non-contrast
Comparison: none

OBSTETRICAL ULTRASOUND:
 This ultrasound was performed in The [HOSPITAL], and the AS OB/GYN report will be stored to [REDACTED] PACS.

[Series 1: us ob follow-up · 27 acquisitions, 14 frames shown]
[im 1/27]
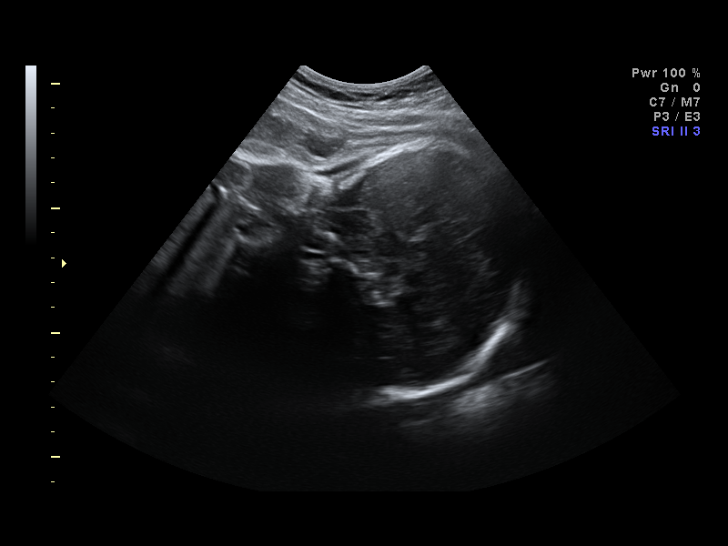
[im 3/27]
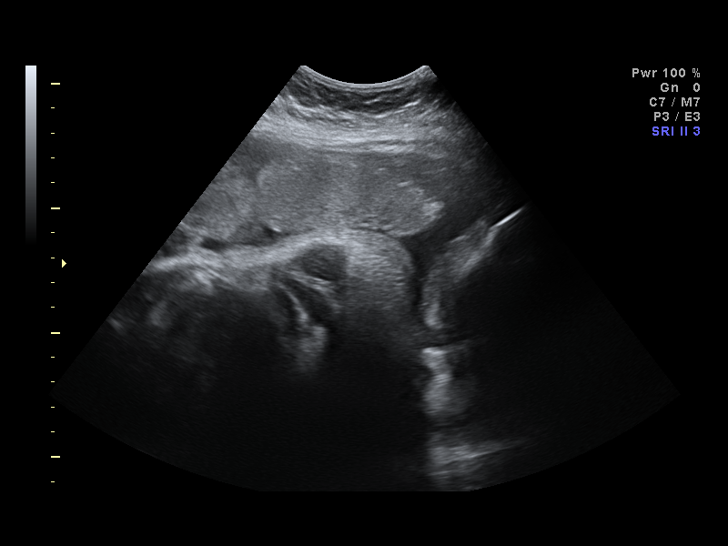
[im 5/27]
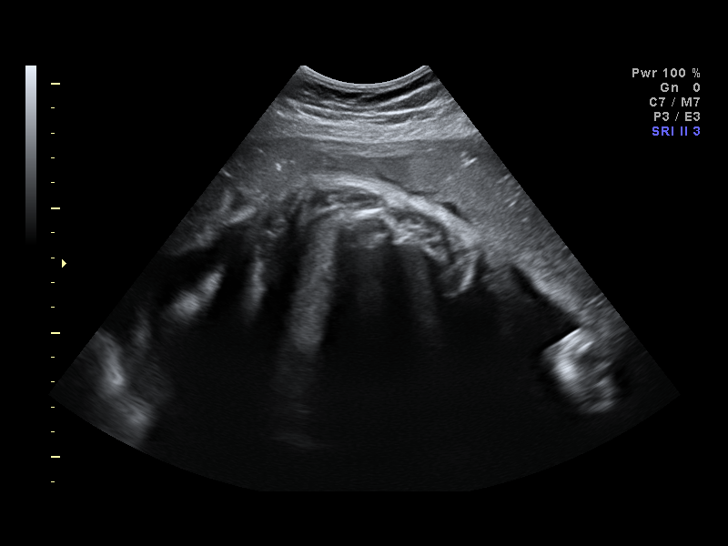
[im 7/27]
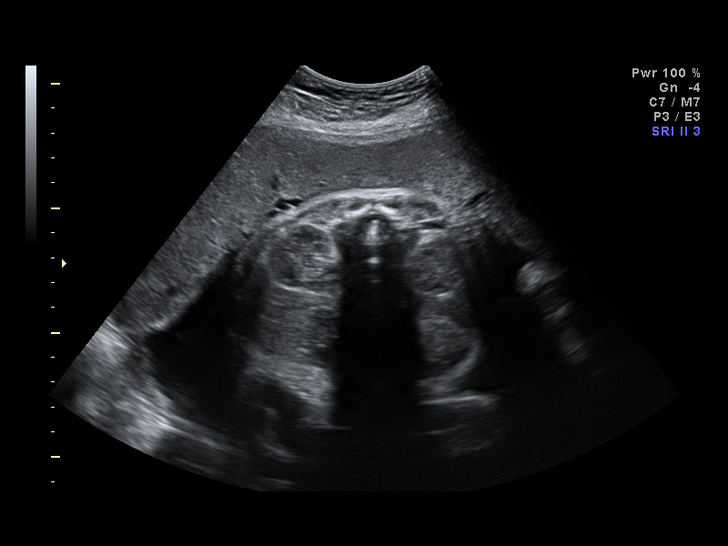
[im 9/27]
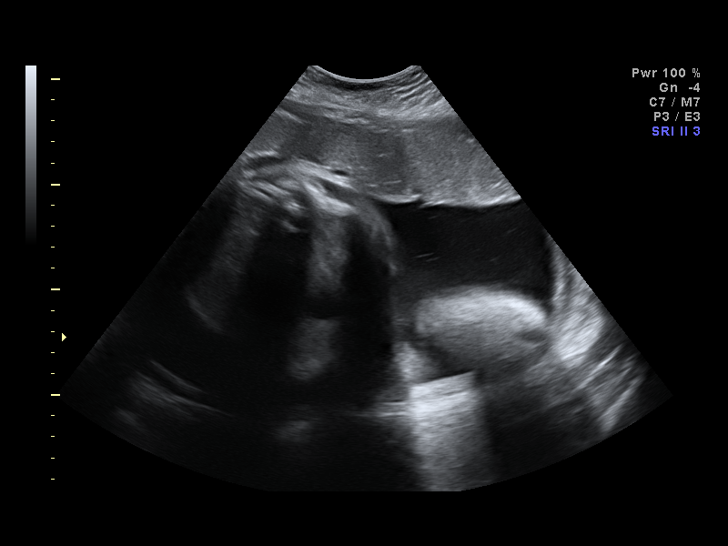
[im 11/27]
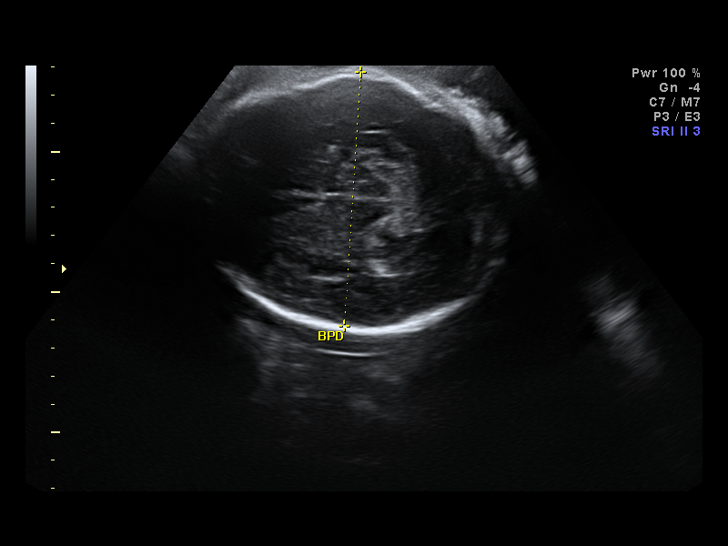
[im 13/27]
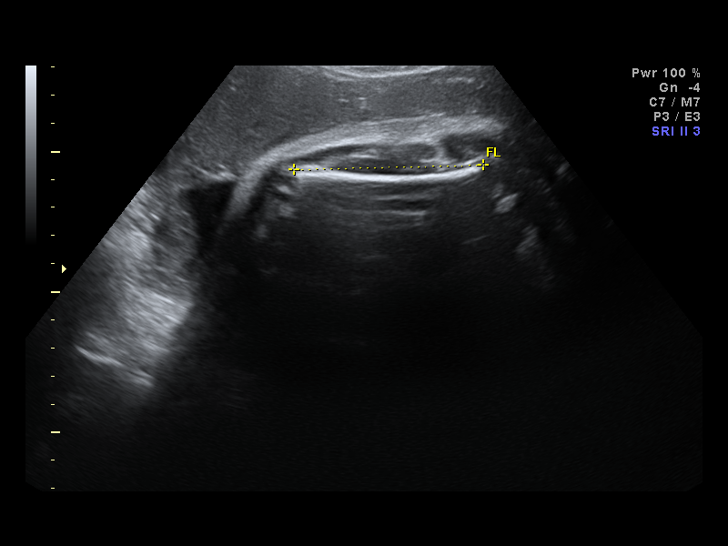
[im 15/27]
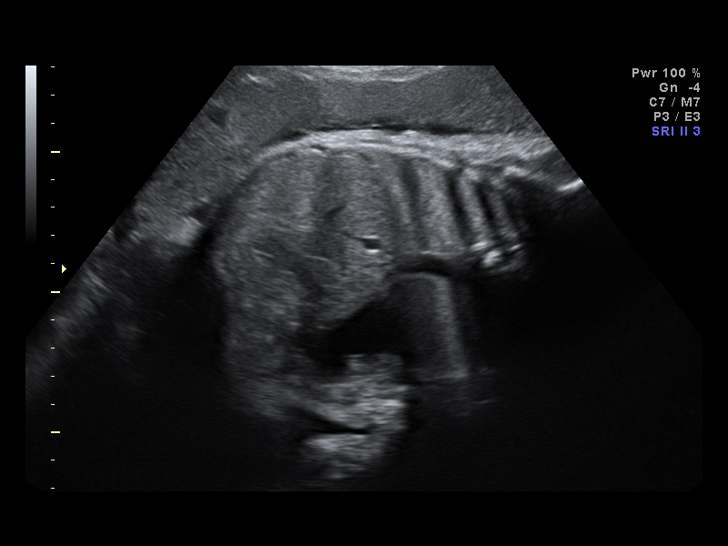
[im 17/27]
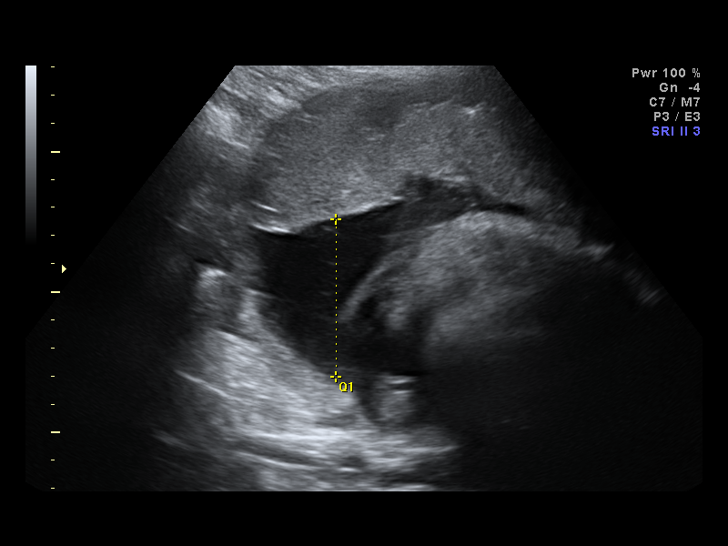
[im 19/27]
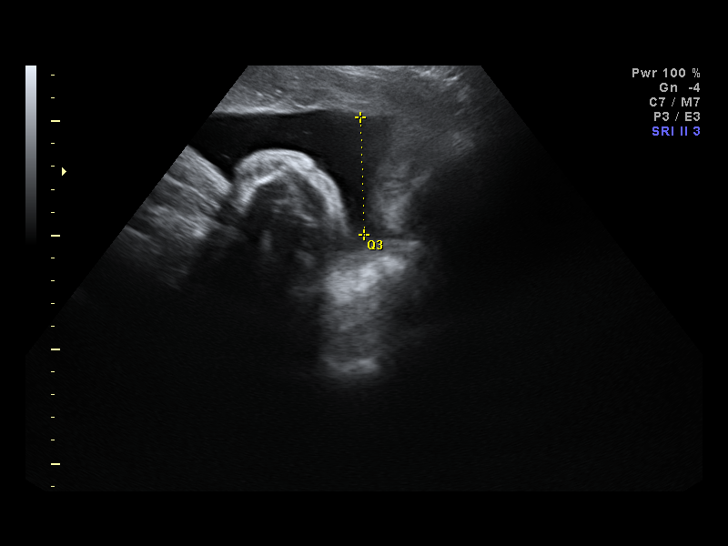
[im 21/27]
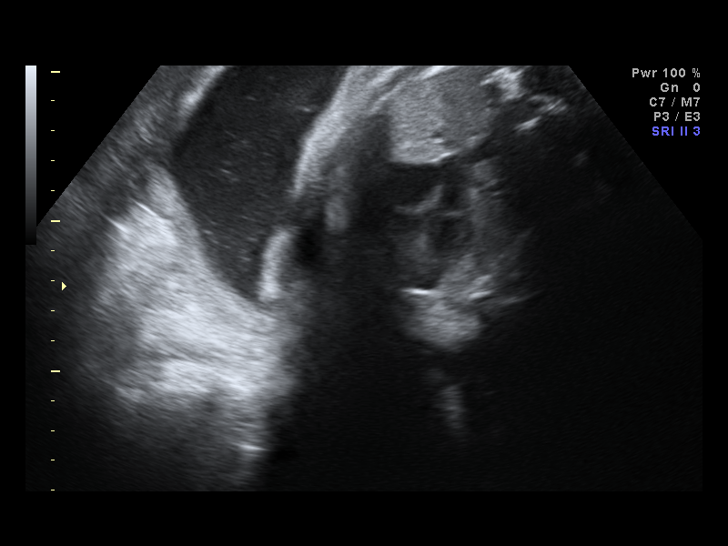
[im 23/27]
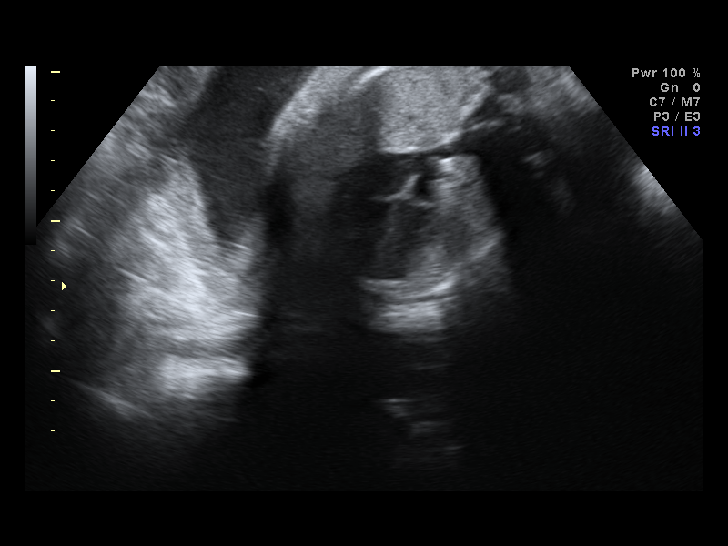
[im 25/27]
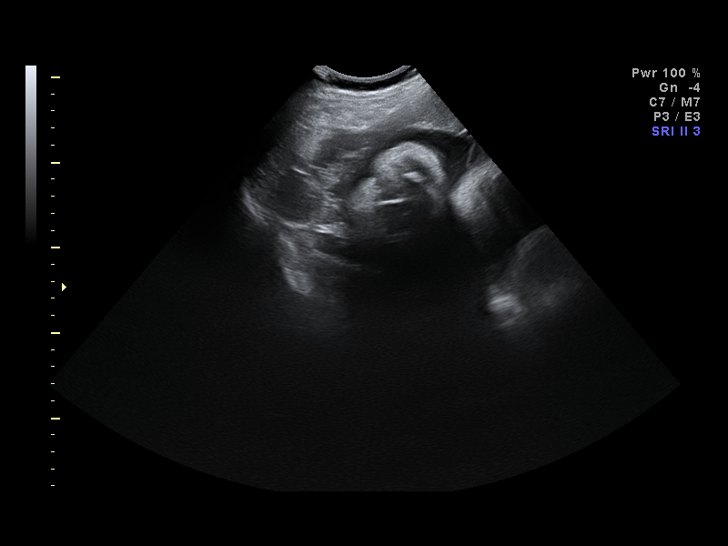
[im 27/27]
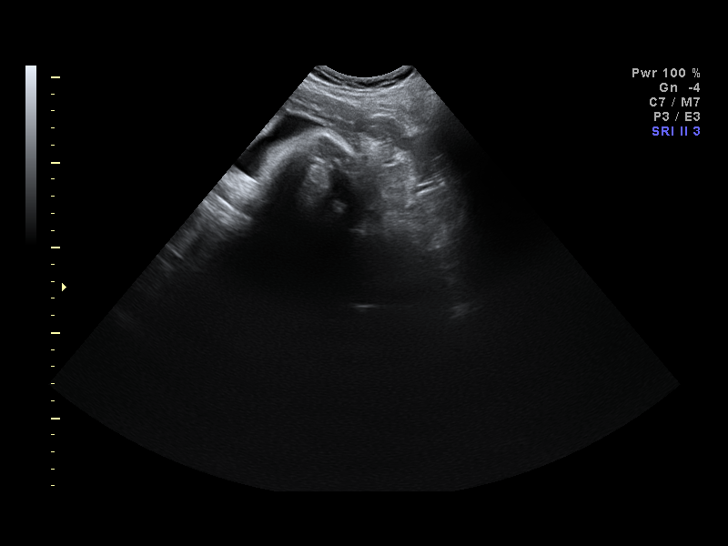

[14 of 27 positions shown; findings below may reference images not displayed]

IMPRESSION: AS OB/GYN has also been faxed to the ordering physician.

## 2008-07-08 ENCOUNTER — Ambulatory Visit: Payer: Self-pay | Admitting: Obstetrics & Gynecology

## 2008-07-12 ENCOUNTER — Ambulatory Visit: Payer: Self-pay | Admitting: Obstetrics and Gynecology

## 2008-07-12 ENCOUNTER — Ambulatory Visit: Payer: Self-pay | Admitting: Obstetrics & Gynecology

## 2008-07-12 ENCOUNTER — Inpatient Hospital Stay (HOSPITAL_COMMUNITY): Admission: AD | Admit: 2008-07-12 | Discharge: 2008-07-12 | Payer: Self-pay | Admitting: Obstetrics & Gynecology

## 2008-07-12 IMAGING — US US FETAL BPP W/O NONSTRESS
1 series · 14 of 17 positions shown · non-contrast
Comparison: none

OBSTETRICAL ULTRASOUND:
 This ultrasound exam was performed in the [HOSPITAL] Ultrasound Department.  The OB US report was generated in the AS system, and faxed to the ordering physician.  This report is also available in [REDACTED] PACS.

[Series 1: us fetal bpp w/o nonstress · non-contrast · 17 acquisitions, 14 frames shown]
[im 1/17]
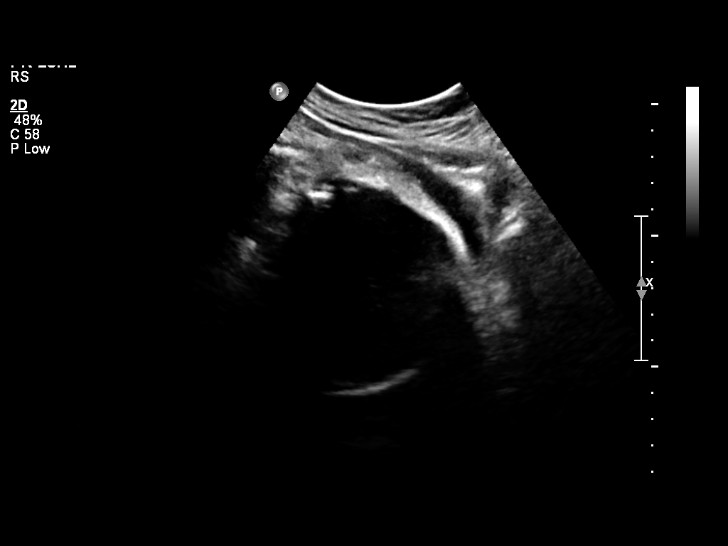
[im 2/17]
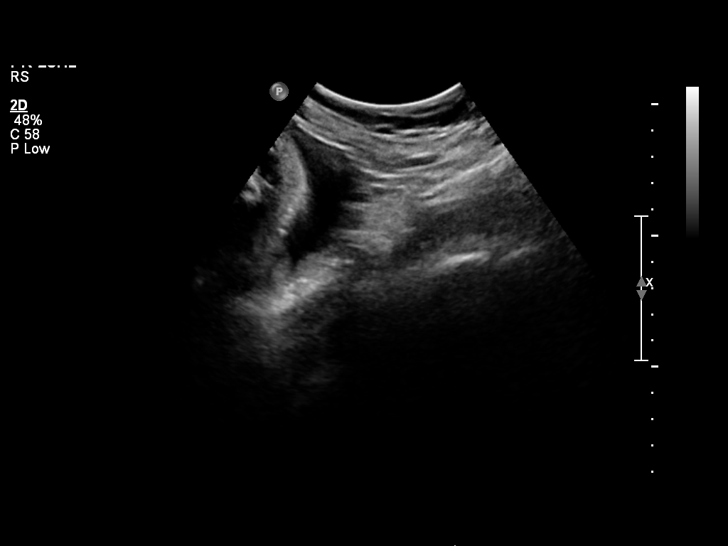
[im 4/17]
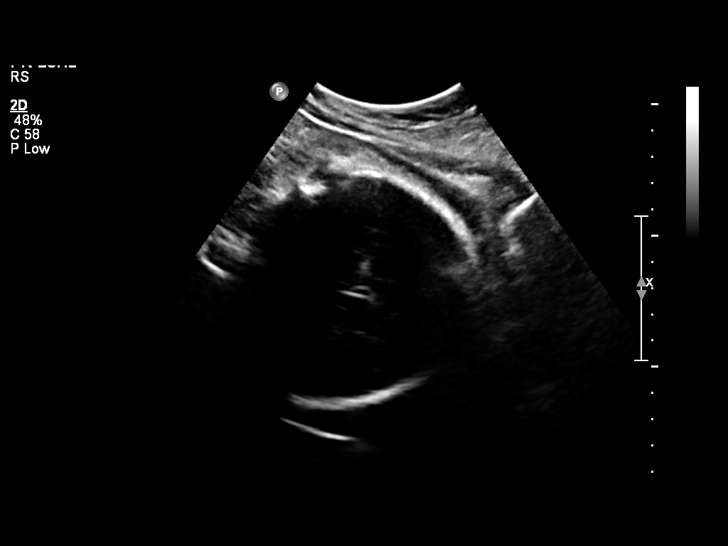
[im 5/17]
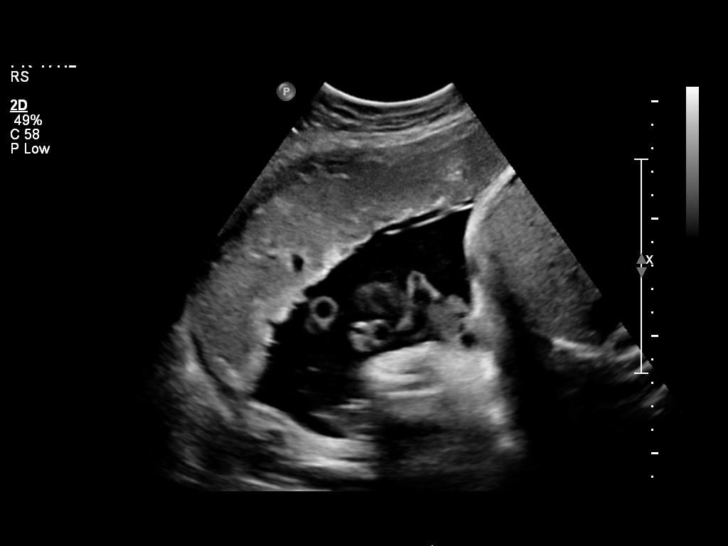
[im 6/17]
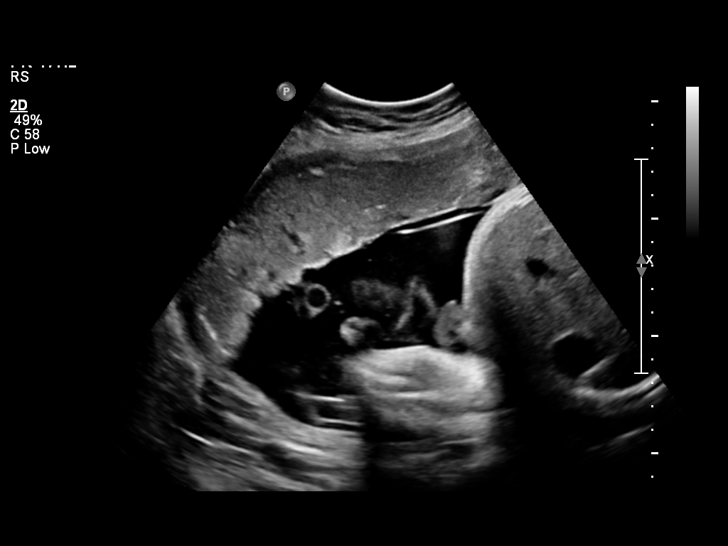
[im 7/17]
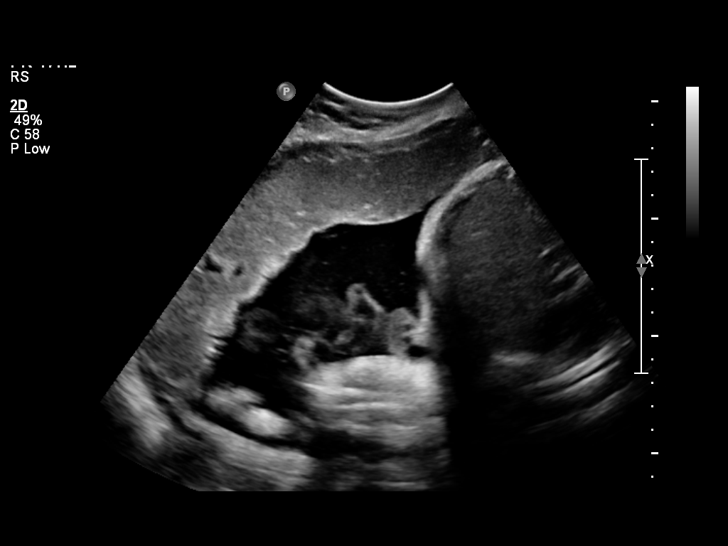
[im 8/17]
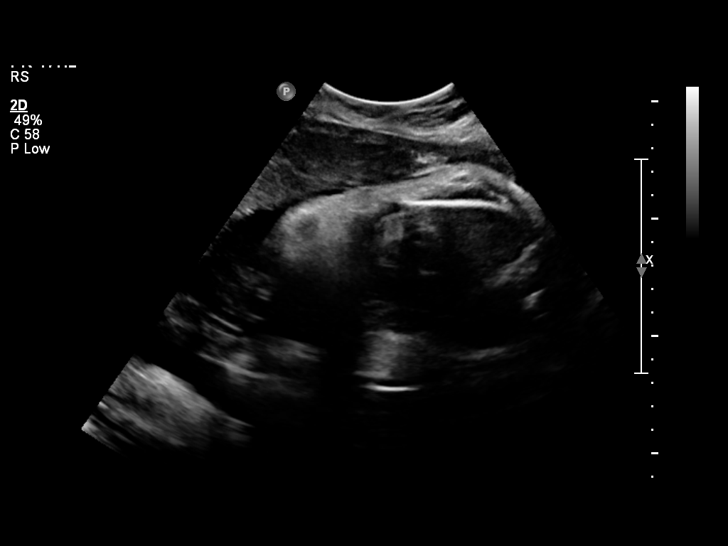
[im 10/17]
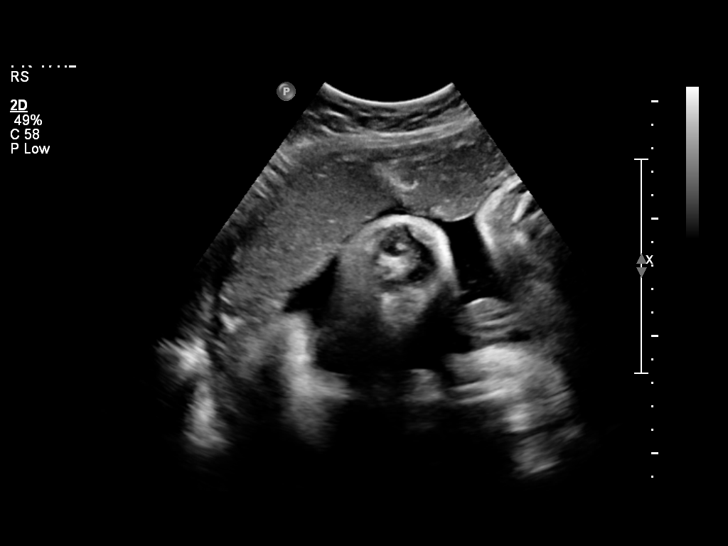
[im 11/17]
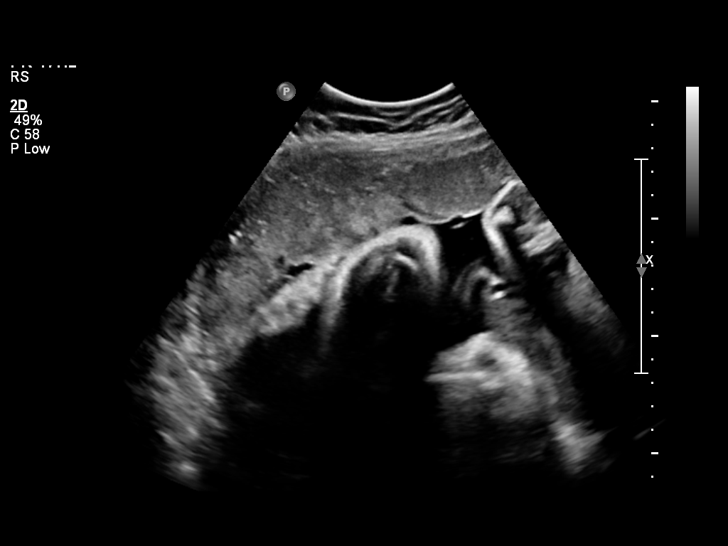
[im 12/17]
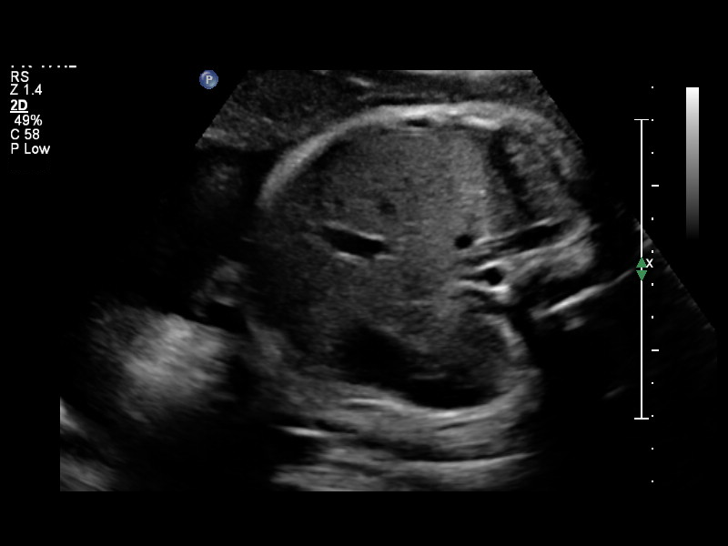
[im 13/17]
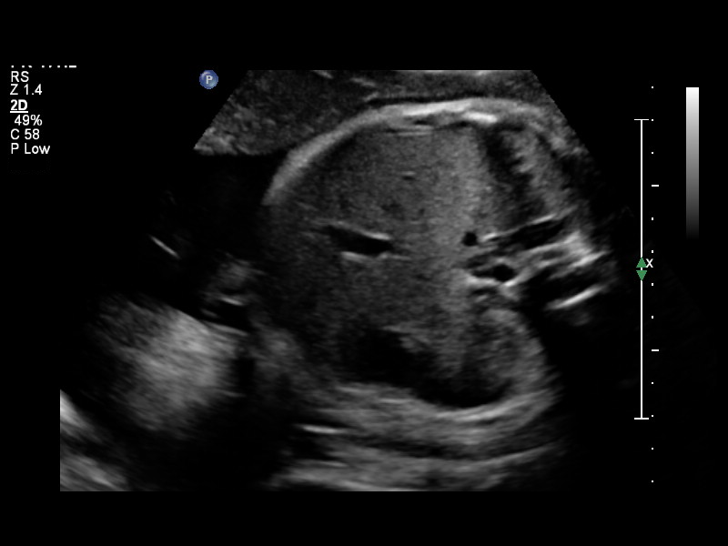
[im 14/17]
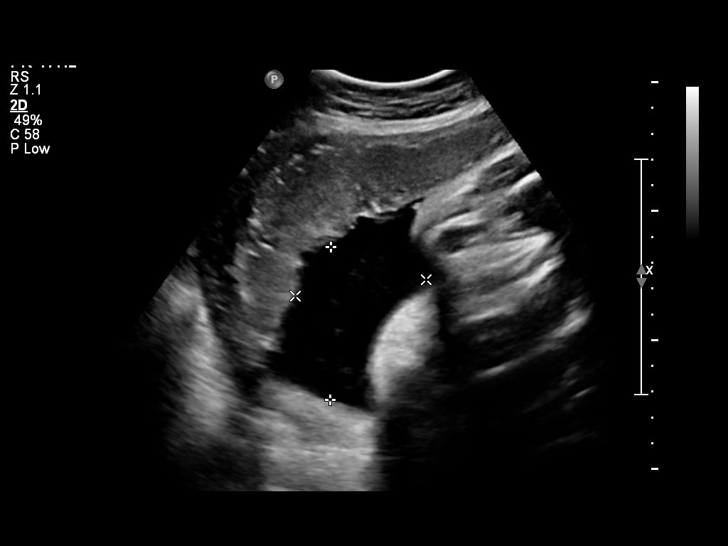
[im 16/17]
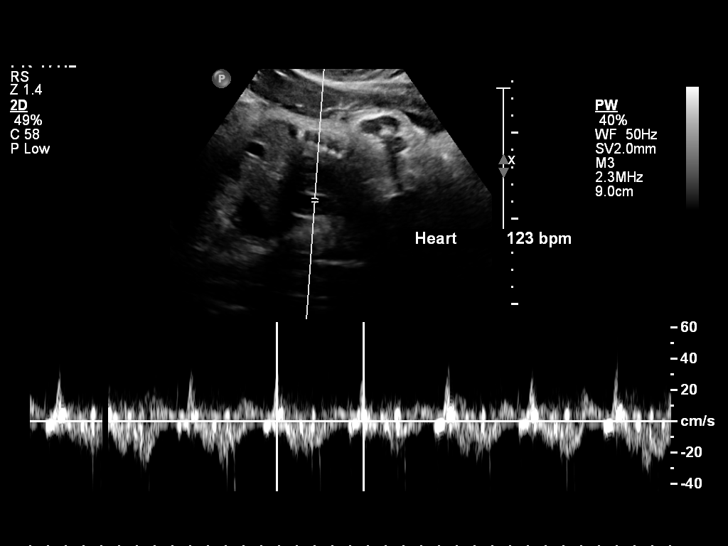
[im 17/17]
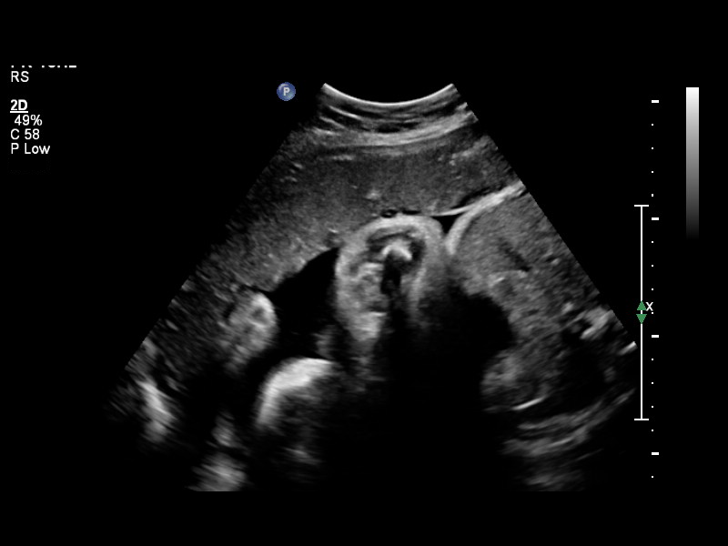

[14 of 17 positions shown; findings below may reference images not displayed]

IMPRESSION: See AS Obstetric US report.

## 2008-07-13 ENCOUNTER — Ambulatory Visit: Payer: Self-pay | Admitting: Family Medicine

## 2008-07-15 ENCOUNTER — Ambulatory Visit: Payer: Self-pay | Admitting: Advanced Practice Midwife

## 2008-07-15 ENCOUNTER — Inpatient Hospital Stay (HOSPITAL_COMMUNITY): Admission: AD | Admit: 2008-07-15 | Discharge: 2008-07-15 | Payer: Self-pay | Admitting: Obstetrics & Gynecology

## 2008-07-15 ENCOUNTER — Ambulatory Visit: Payer: Self-pay | Admitting: Obstetrics and Gynecology

## 2008-07-15 IMAGING — US US FETAL BPP W/O NONSTRESS
1 series · 14 of 15 positions shown · non-contrast
Comparison: none

OBSTETRICAL ULTRASOUND:
 This ultrasound was performed in The [HOSPITAL], and the AS OB/GYN report will be stored to [REDACTED] PACS.

[Series 1: us fetal bpp w/o nonstress · 15 acquisitions, 14 frames shown]
[im 1/15]
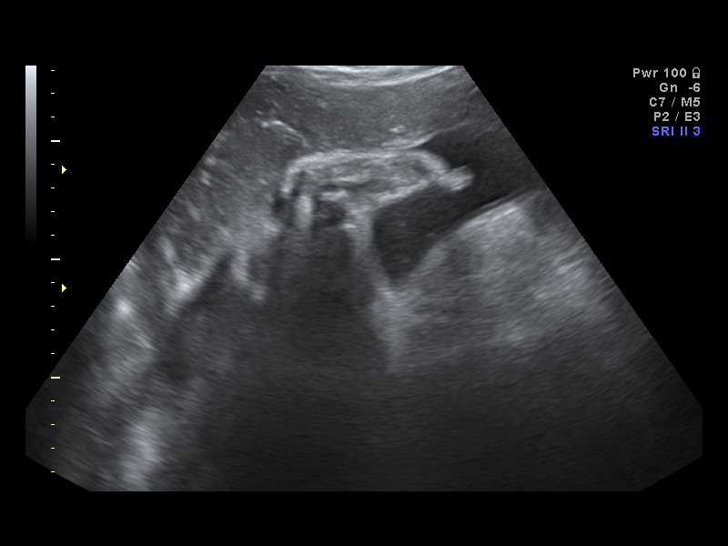
[im 2/15]
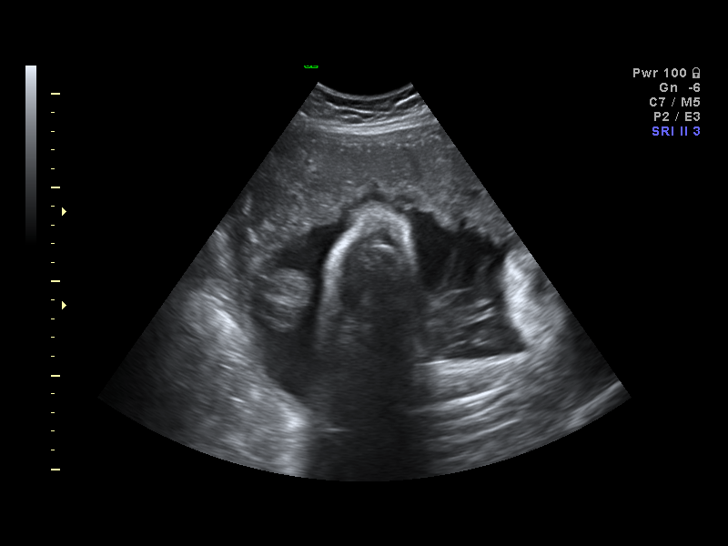
[im 3/15]
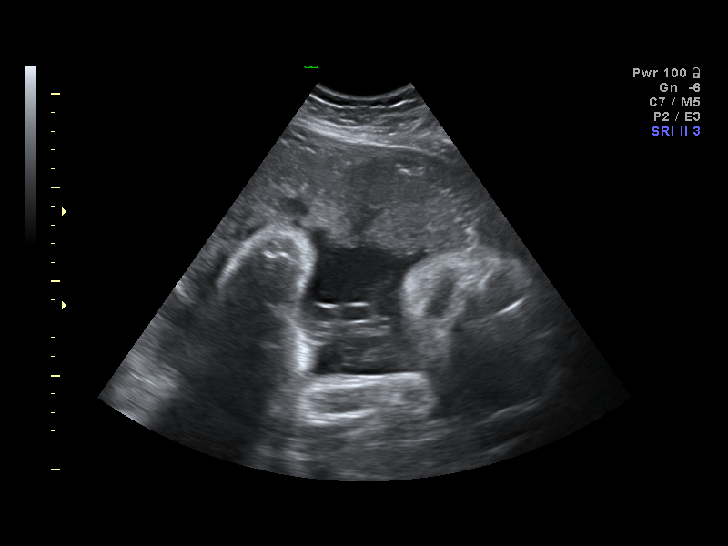
[im 4/15]
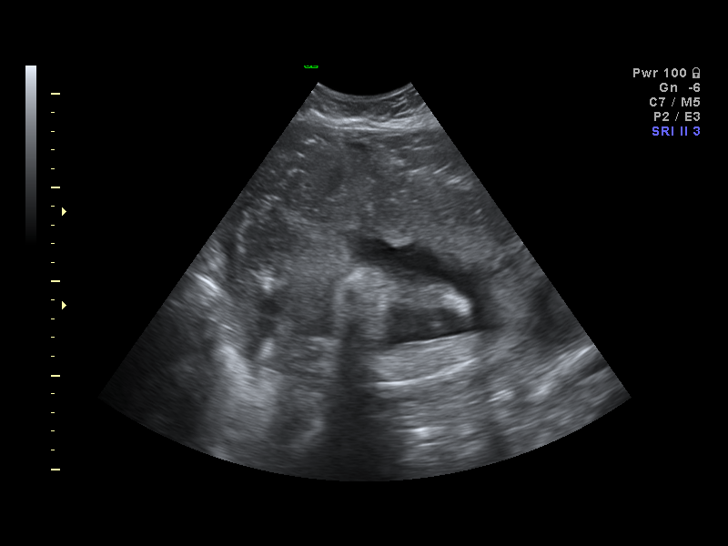
[im 5/15]
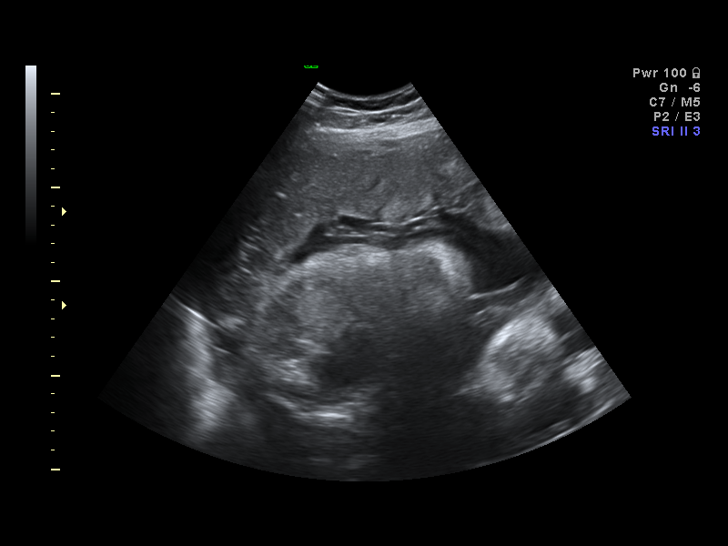
[im 6/15]
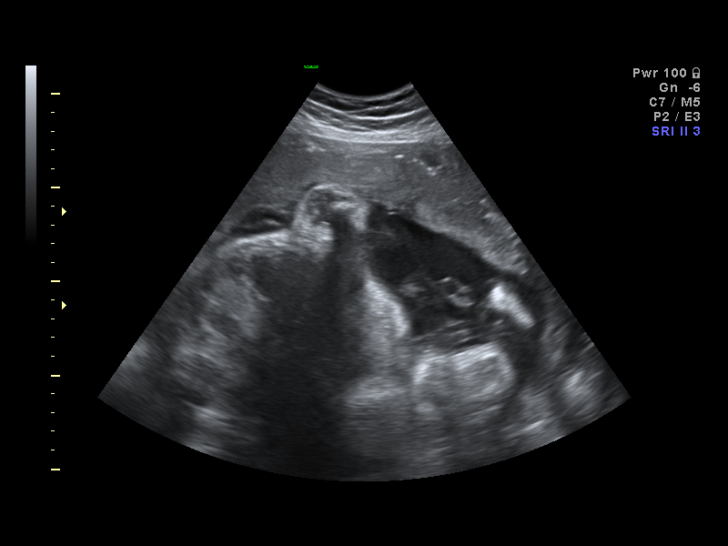
[im 7/15]
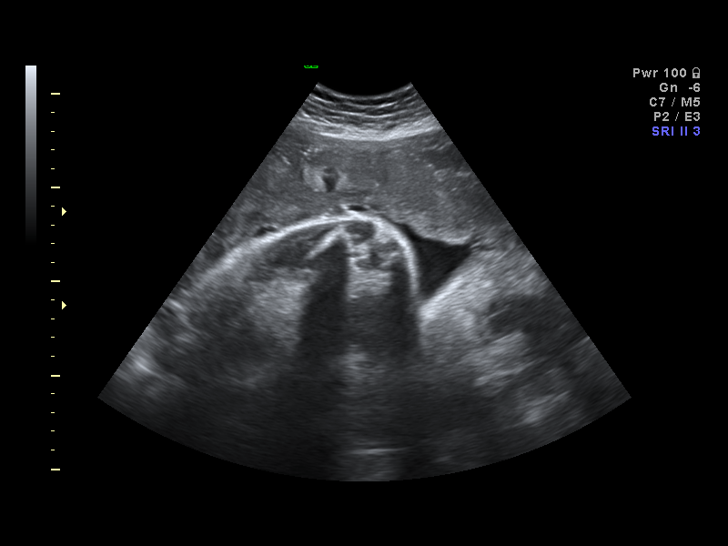
[im 9/15]
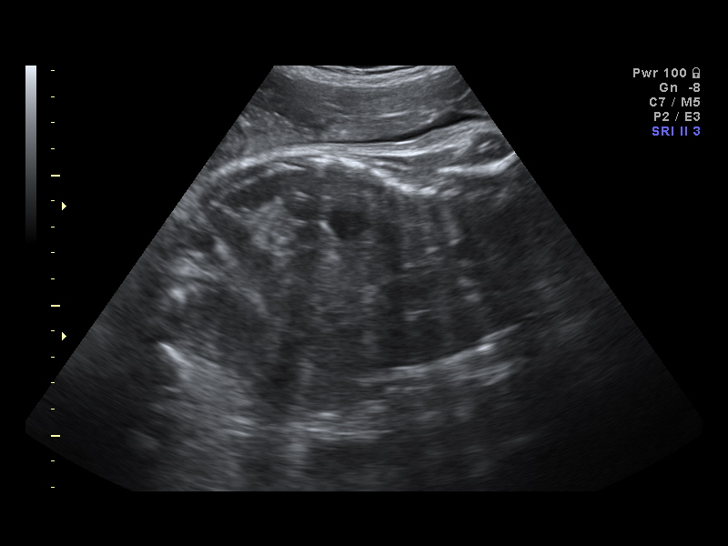
[im 10/15]
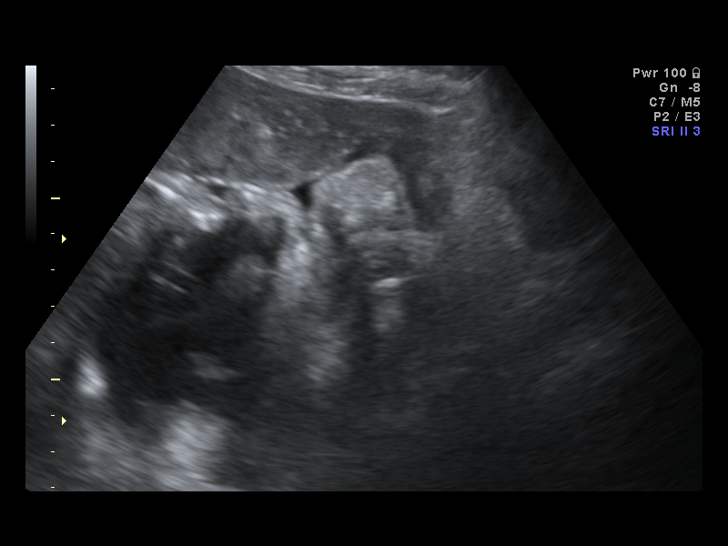
[im 11/15]
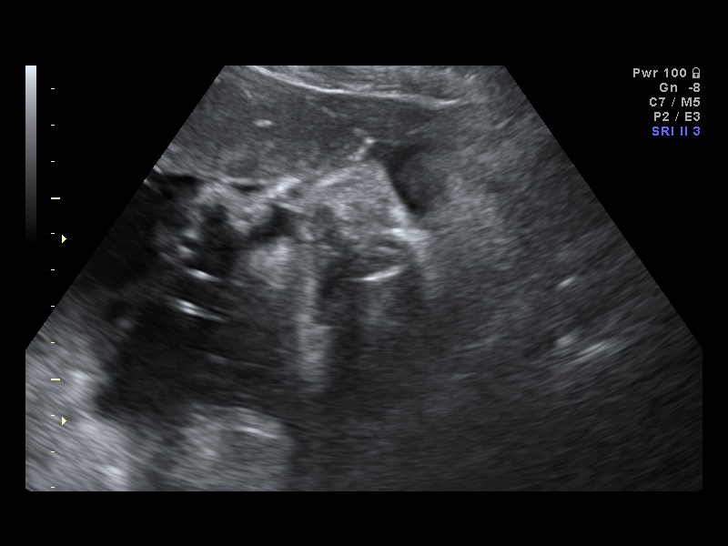
[im 12/15]
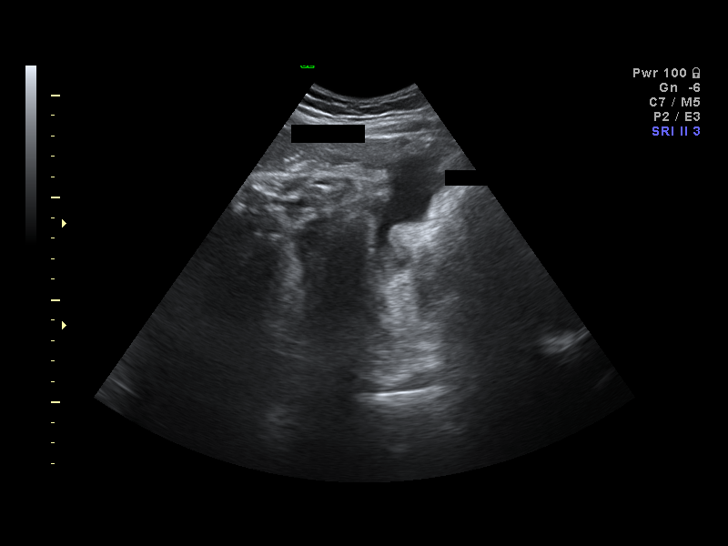
[im 13/15]
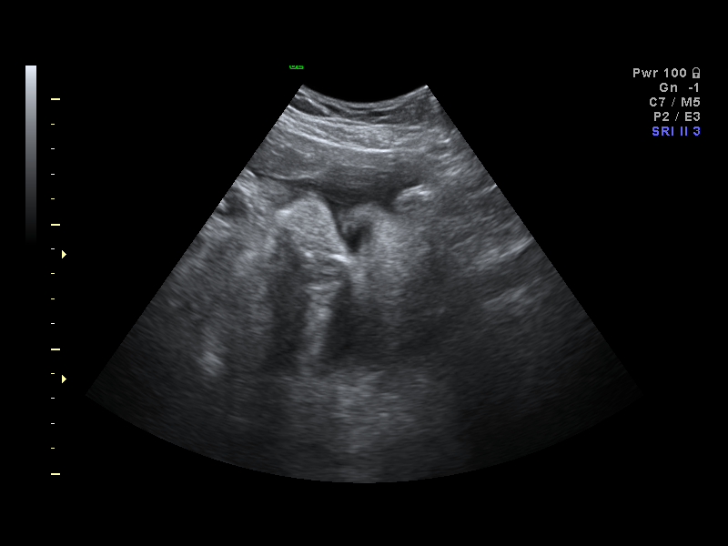
[im 14/15]
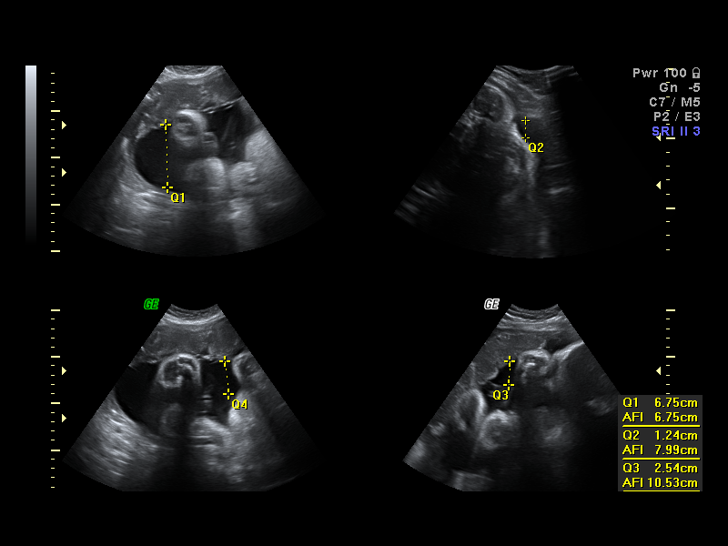
[im 15/15]
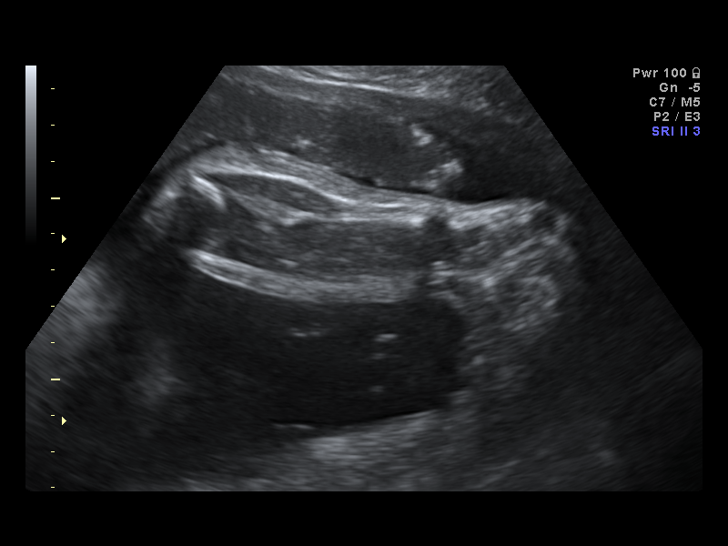

[14 of 15 positions shown; findings below may reference images not displayed]

IMPRESSION: AS OB/GYN has also been faxed to the ordering physician.

## 2008-07-17 ENCOUNTER — Ambulatory Visit: Payer: Self-pay | Admitting: Advanced Practice Midwife

## 2008-07-17 ENCOUNTER — Inpatient Hospital Stay (HOSPITAL_COMMUNITY): Admission: AD | Admit: 2008-07-17 | Discharge: 2008-07-19 | Payer: Self-pay | Admitting: Obstetrics & Gynecology

## 2008-08-31 ENCOUNTER — Ambulatory Visit: Payer: Self-pay | Admitting: Family Medicine

## 2008-09-09 ENCOUNTER — Ambulatory Visit: Payer: Self-pay | Admitting: Obstetrics and Gynecology

## 2009-01-17 ENCOUNTER — Emergency Department: Payer: Self-pay | Admitting: Emergency Medicine

## 2009-01-27 ENCOUNTER — Emergency Department: Payer: Self-pay | Admitting: Emergency Medicine

## 2009-05-12 ENCOUNTER — Ambulatory Visit: Payer: Self-pay | Admitting: Obstetrics & Gynecology

## 2009-06-27 ENCOUNTER — Emergency Department: Payer: Self-pay | Admitting: Emergency Medicine

## 2009-06-28 ENCOUNTER — Emergency Department: Payer: Self-pay | Admitting: Emergency Medicine

## 2009-07-28 ENCOUNTER — Emergency Department: Payer: Self-pay | Admitting: Emergency Medicine

## 2009-07-28 IMAGING — CR DG CHEST 2V
1 series · 2 of 2 positions shown · non-contrast
Comparison: none

REASON FOR EXAM: right sided chest pain    Flex 3
COMMENTS:   LMP: negative

[Series 1: view not recorded · 0.17mm/px · 2 of 2 slices shown]
[im 1/2]
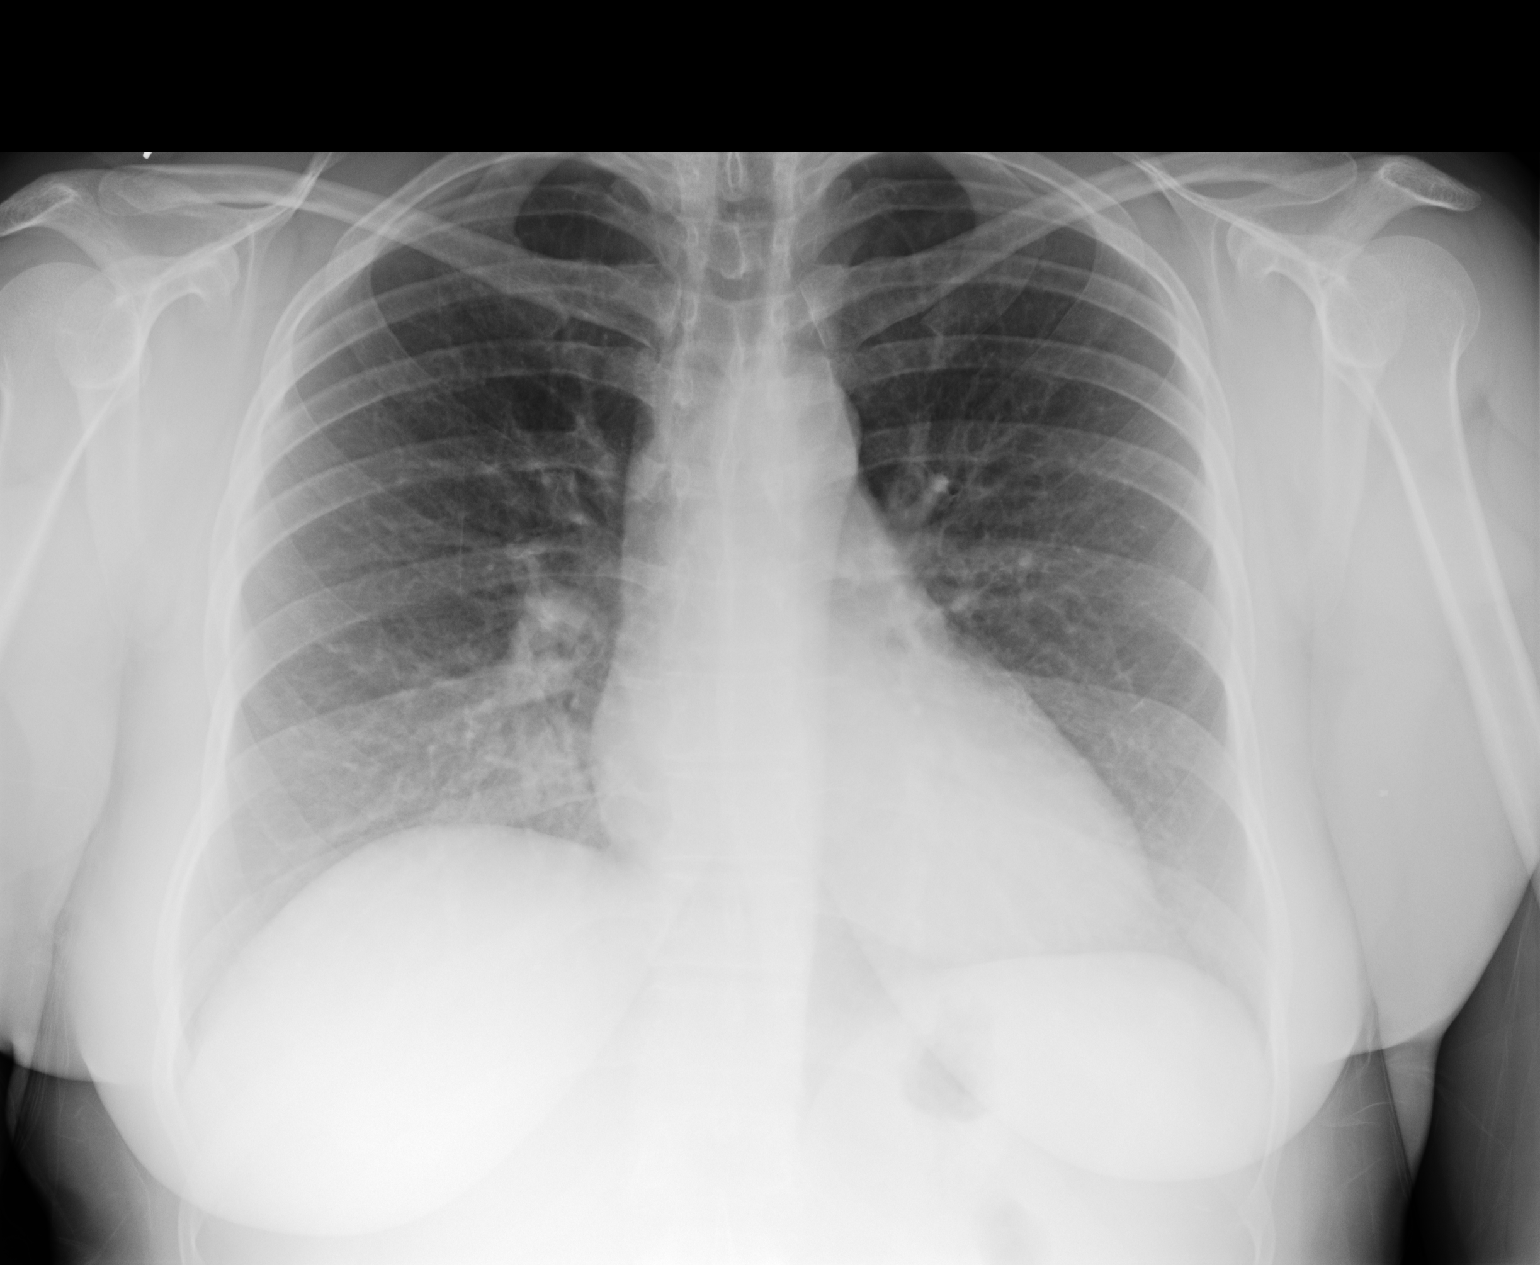
[im 2/2]
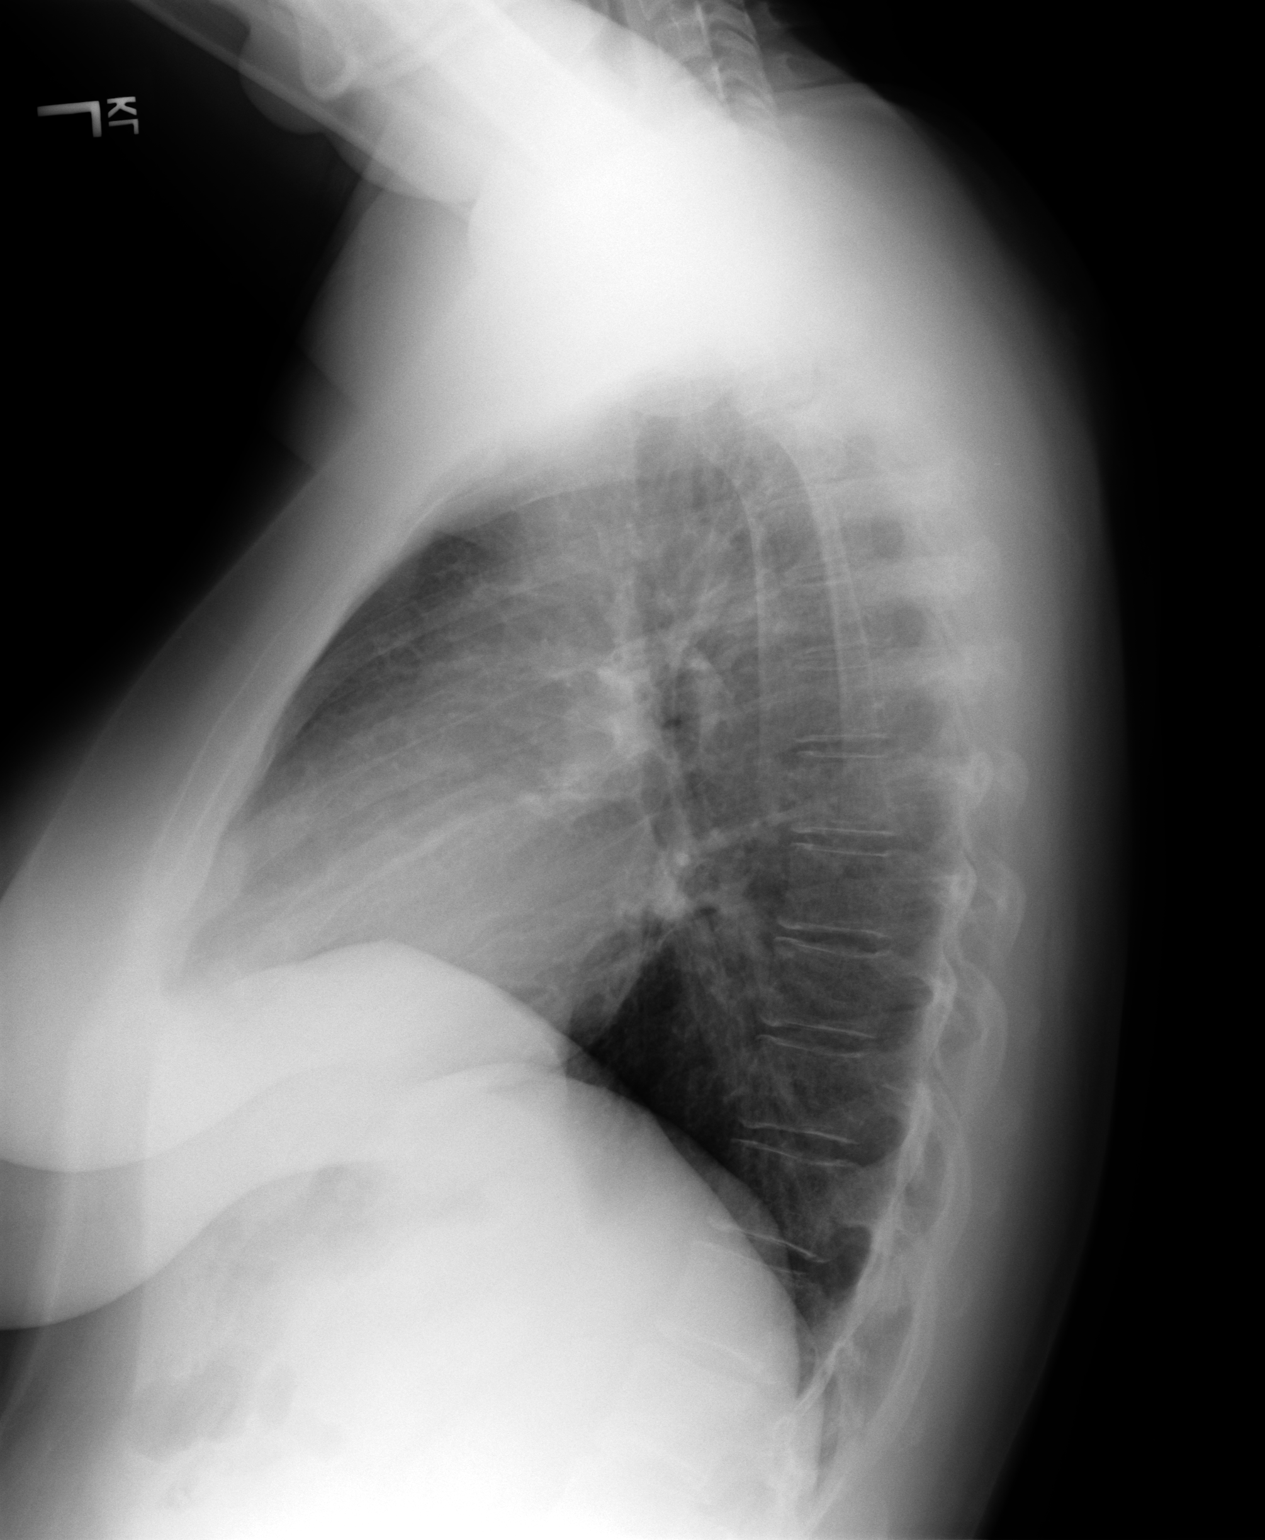

[2 of 2 positions shown; findings below may reference images not displayed]

PROCEDURE:     DXR - DXR CHEST PA (OR AP) AND LATERAL  - [DATE]  [DATE]

RESULT:     There is thickening of the right basilar markings at the right
cardiophrenic angle. This is thought to represent vascular markings that are
minimally accentuated by motion. No definite infiltrative changes are seen.
The appearance does not persist in the lateral view. The lung fields
otherwise are clear. The heart, mediastinal and osseous structures are
normal in appearance.
IMPRESSION: 1. No significant abnormalities are identified.
2. Follow-up examination is recommended if symptomatology persists.

## 2009-12-11 ENCOUNTER — Emergency Department: Payer: Self-pay | Admitting: Emergency Medicine

## 2010-03-31 ENCOUNTER — Emergency Department: Payer: Self-pay | Admitting: Emergency Medicine

## 2010-05-02 ENCOUNTER — Ambulatory Visit: Payer: Self-pay | Admitting: Obstetrics & Gynecology

## 2010-05-23 ENCOUNTER — Other Ambulatory Visit: Admission: RE | Admit: 2010-05-23 | Discharge: 2010-05-23 | Payer: Self-pay | Admitting: Family Medicine

## 2010-05-23 ENCOUNTER — Ambulatory Visit: Payer: Self-pay | Admitting: Family Medicine

## 2010-07-13 ENCOUNTER — Emergency Department: Payer: Self-pay | Admitting: Unknown Physician Specialty

## 2010-08-21 ENCOUNTER — Emergency Department: Payer: Self-pay | Admitting: Emergency Medicine

## 2010-08-22 ENCOUNTER — Emergency Department: Payer: Self-pay | Admitting: Emergency Medicine

## 2010-08-22 IMAGING — US TRANSABDOMINAL ULTRASOUND OF PELVIS
1 series · 17 of 25 positions shown · non-contrast
Comparison: none

REASON FOR EXAM: r flank and lower quadrant pain
COMMENTS:

PROCEDURE:     US  - US PELVIS EXAM  - [DATE]  [DATE]
RESULT:     Comparison: None.

[Series 1: transabdominal ultrasound of pelvis · 17 of 58 slices shown]
[im 1/58]
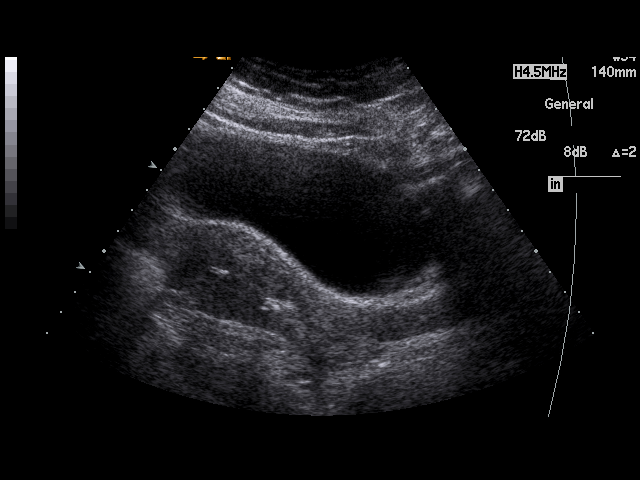
[im 5/58]
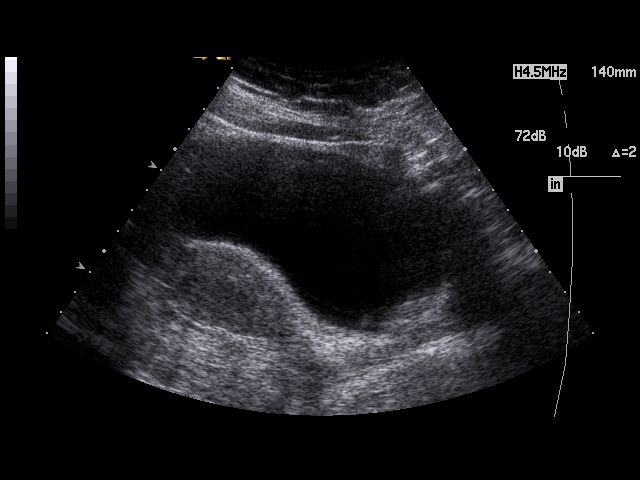
[im 8/58]
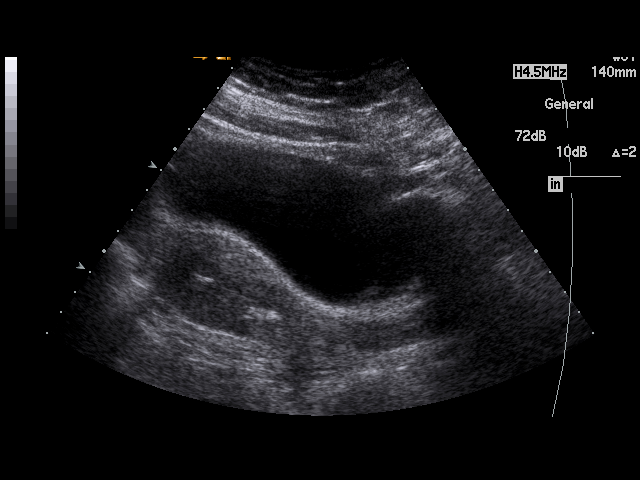
[im 12/58]
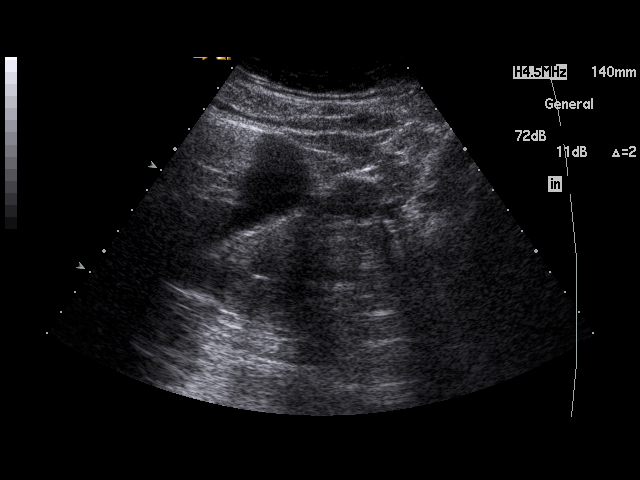
[im 15/58]
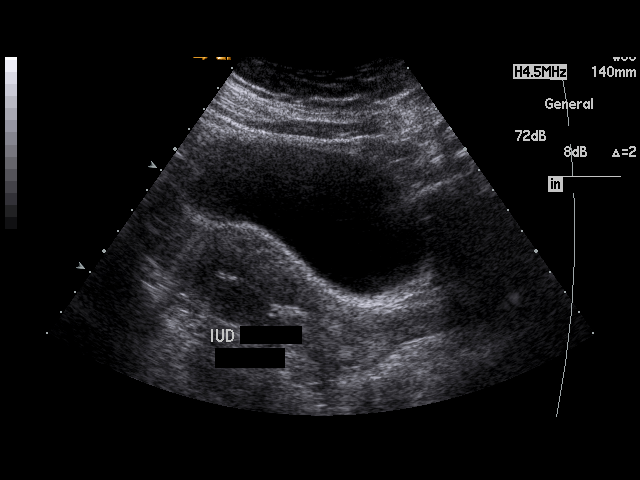
[im 20/58]
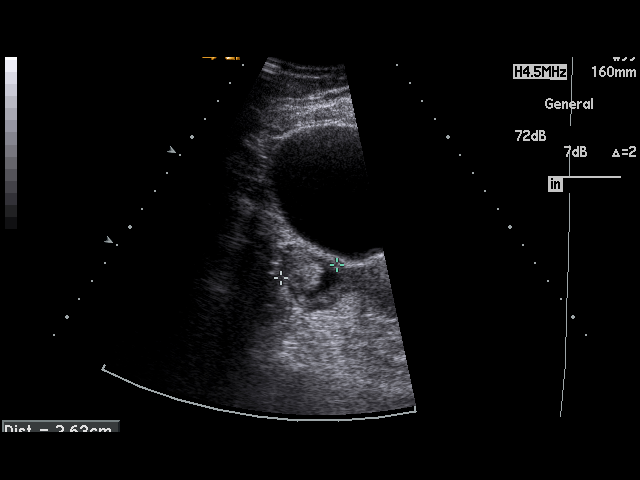
[im 22/58]
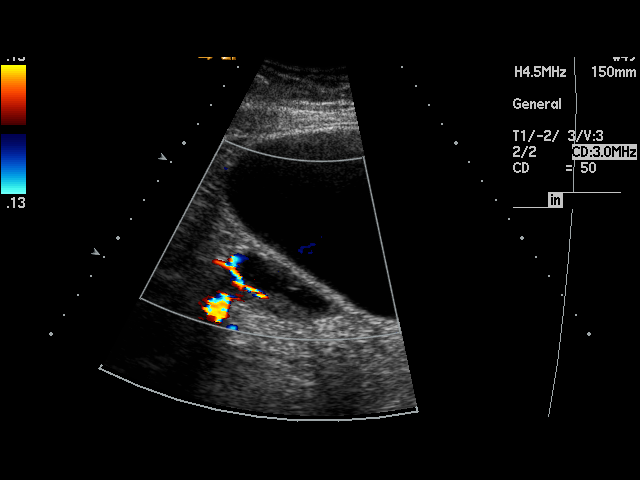
[im 27/58]
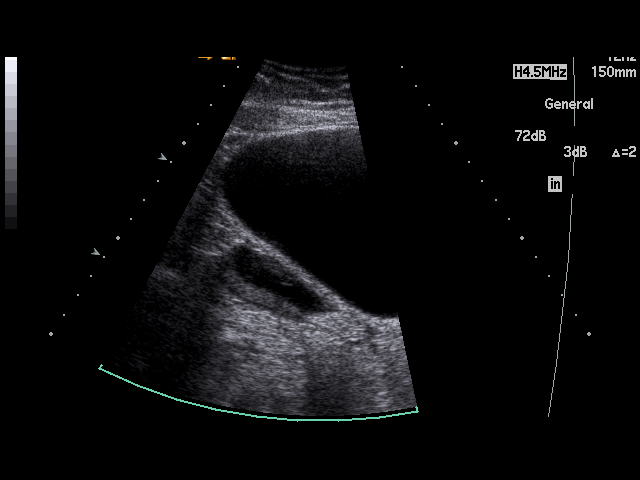
[im 29/58]
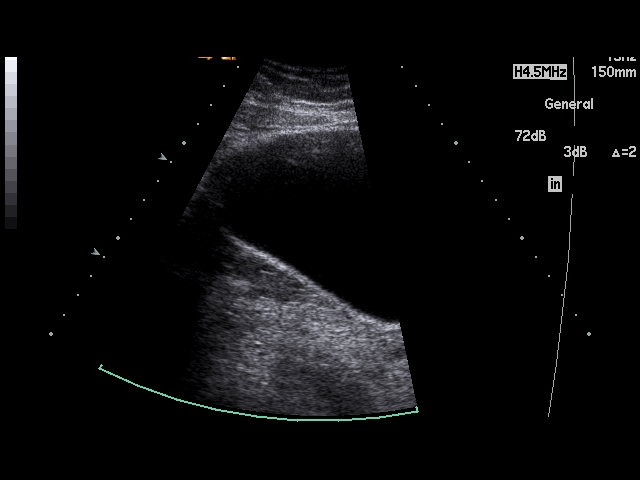
[im 31/58]
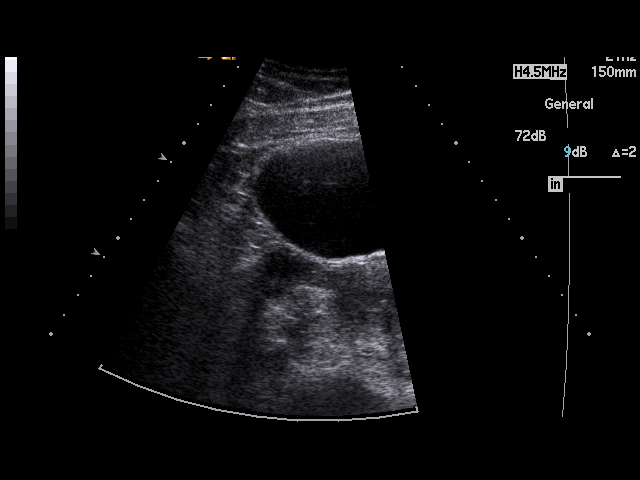
[im 36/58]
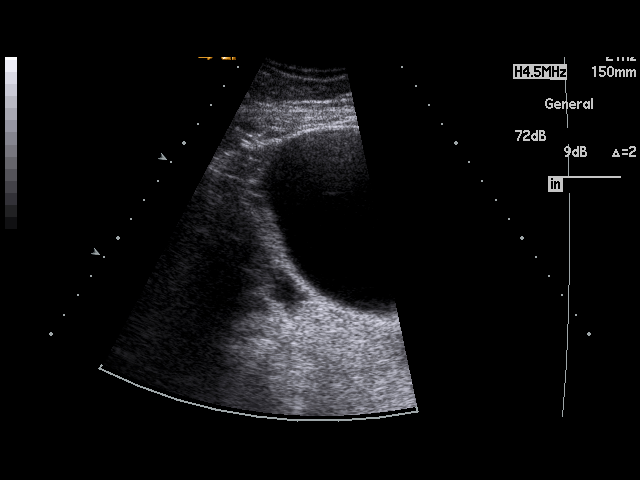
[im 39/58]
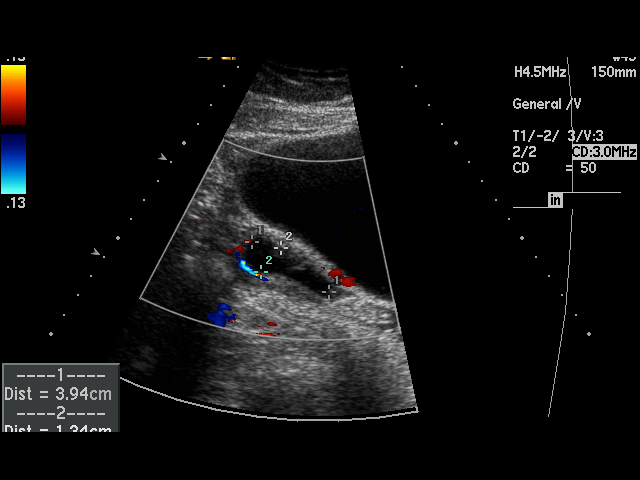
[im 43/58]
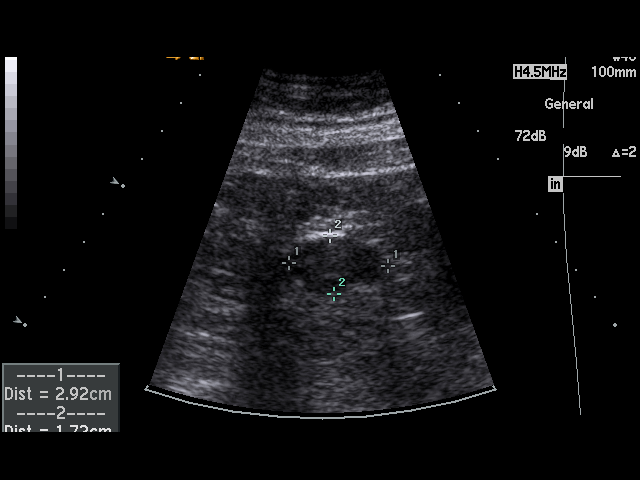
[im 46/58]
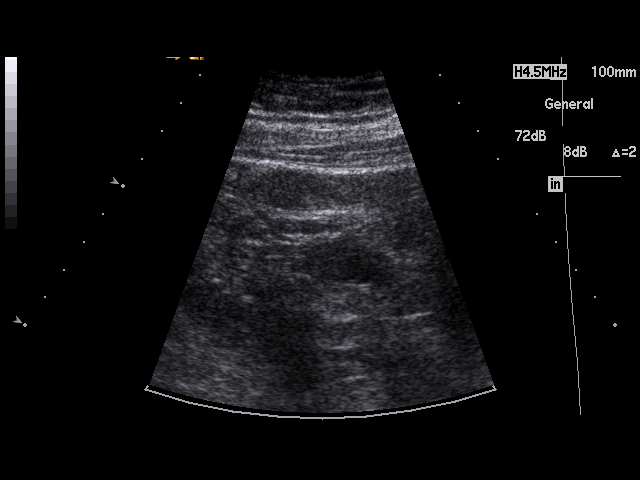
[im 50/58]
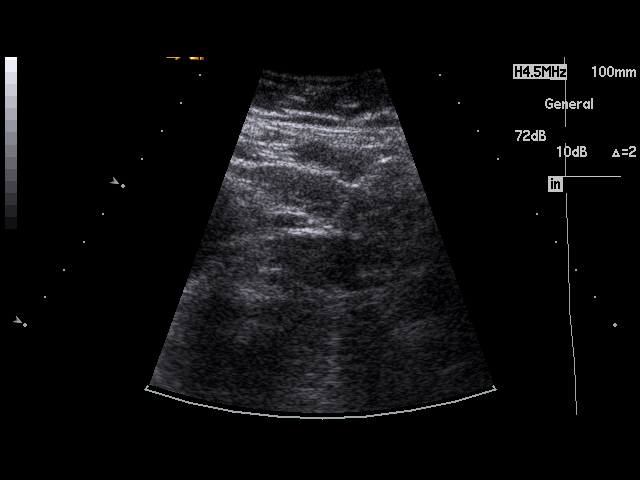
[im 53/58]
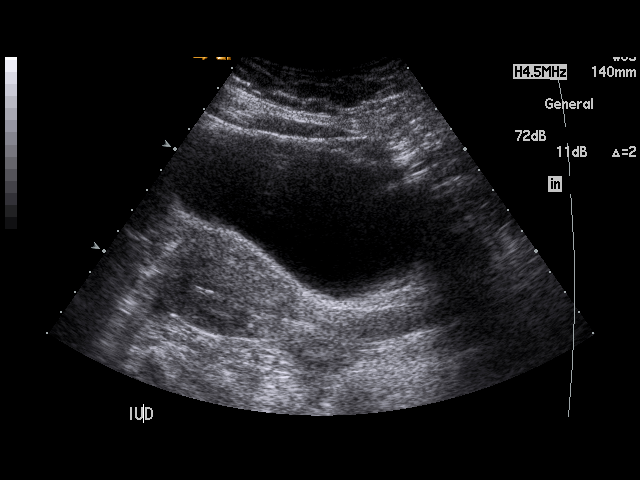
[im 58/58]
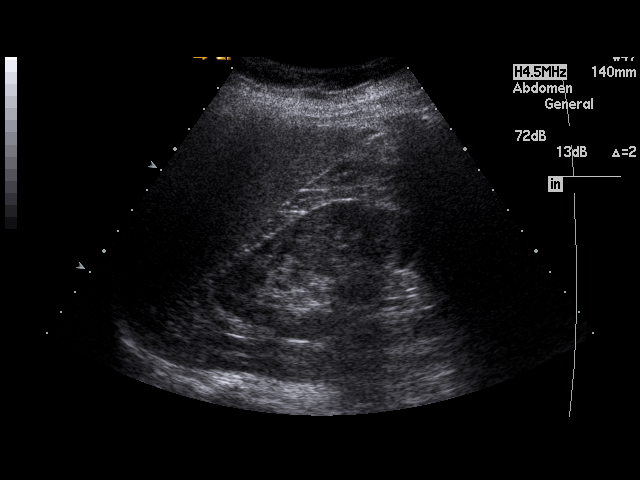

[17 of 25 positions shown; findings below may reference images not displayed]

FINDINGS: Multiple grayscale and color Doppler images obtained of the pelvis via
transabdominal ultrasound. Endovaginal ultrasound was not performed.

The uterus measures 9.4 x 3.7 x 4.8 cm. The endometrial stripe measures 4
mm. Linear hyper echogenicity within the endometrial canal correlates with
the patient's intrauterine contraceptive device.

The right ovary measures 6.7 x 2.8 x 2.6 cm. The left ovary measures 2.9 x
1.7 by 2.0 cm. There is a complex cystic structure in the left ovary
measuring 3.9 x 1.3 x 1.7 cm. This may represent a hemorrhagic or located
cyst. Color Doppler flow is associated with the bilateral ovaries. However,
spectral Doppler imaging was not performed. If there is clinical concern for
ovarian torsion, endovaginal ultrasound and spectral Doppler imaging is
recommended.
IMPRESSION: 1. Findings which may represent a hemorrhagic or complicated cyst in the
right kidney. Given its size, a followup pelvic ultrasound in 6 weeks is
recommended to ensure resolution.
2. Please see above for other findings.

## 2010-10-16 LAB — COMPREHENSIVE METABOLIC PANEL
ALT: 22 U/L (ref 0–35)
Albumin: 2.6 g/dL — ABNORMAL LOW (ref 3.5–5.2)
Alkaline Phosphatase: 131 U/L — ABNORMAL HIGH (ref 39–117)
BUN: 7 mg/dL (ref 6–23)
Chloride: 109 mEq/L (ref 96–112)
Glucose, Bld: 85 mg/dL (ref 70–99)
Potassium: 3.7 mEq/L (ref 3.5–5.1)
Sodium: 136 mEq/L (ref 135–145)
Total Bilirubin: 0.4 mg/dL (ref 0.3–1.2)

## 2010-10-16 LAB — CBC
HCT: 33.1 % — ABNORMAL LOW (ref 36.0–46.0)
MCV: 91.5 fL (ref 78.0–100.0)
RBC: 3.62 MIL/uL — ABNORMAL LOW (ref 3.87–5.11)
WBC: 12.3 10*3/uL — ABNORMAL HIGH (ref 4.0–10.5)

## 2010-11-06 ENCOUNTER — Ambulatory Visit (INDEPENDENT_AMBULATORY_CARE_PROVIDER_SITE_OTHER): Payer: Self-pay

## 2010-11-06 DIAGNOSIS — Z30431 Encounter for routine checking of intrauterine contraceptive device: Secondary | ICD-10-CM

## 2010-11-14 NOTE — Assessment & Plan Note (Signed)
[name], BEHRENDT             ACCOUNT NO.:  [id number]   MEDICAL RECORD NO.:  [id number]          PATIENT TYPE:  POB   LOCATION:  [facility] at [facility]         FACILITY:  [facility]   PHYSICIAN:  Tinnie Gens, MD        DATE OF BIRTH:  1981/05/07   DATE OF SERVICE:  05/23/2010                                  CLINIC NOTE   CHIEF COMPLAINT:  Yearly Pap and IUD check.   HISTORY OF PRESENT ILLNESS:  The patient is a 30 year old gravida 4,  para 2-0-2-2, who is here today for yearly exam.  She had an IUD placed  on May 02, 2010.  Since insertion, she has had no real complaints.  She continues to have regular cycles which had been heavy.  She  continues to be on atenolol for blood pressure control.  However, blood  pressure has been very high.  She has tried to see [facility], but she  has to be a resident at [facility] to go there, and she has tried  the [facility] some time and cannot seem to get an appointment with  them.  Blood pressure again is too high today.  Her weight is up 1 pound  from then.   PAST MEDICAL HISTORY:  Significant for chronic hypertension and tobacco  use.   PAST SURGICAL HISTORY:  Tonsillectomy.   OBSTETRICAL HISTORY:  She is a G4, P2-0-2-2 with 2 vaginal deliveries  and 2 voluntary interruptions of pregnancy.   GYN HISTORY:  No history of abnormal Pap.   SOCIAL HISTORY:  She is about half a pack per day smoker, one alcoholic  beverage per week.  No other drugs noted.  She works in [employer].   REVIEW OF SYSTEMS:  Reviewed.  She denies headache, vision changes,  nausea, vomiting, diarrhea, constipation, fevers, chills, chest pain,  shortness of breath, blood in stool, blood in urine, swelling of feet or  ankles, or breast lumps or bumps.  She does not do self-breast exams as  instructed.  She does have a head cold at present.   PHYSICAL EXAMINATION:  VITAL SIGNS:  In the chart as noted.  Her blood  pressure is 139/106.  HEENT:   Normocephalic, atraumatic.  Sclerae anicteric.  NECK:  Supple.  Normal thyroid.  LUNGS:  Clear bilaterally.  CV:  Regular rate and rhythm without rubs, gallops, or murmurs.  ABDOMEN:  Soft, nontender, nondistended.  EXTREMITIES:  No cyanosis, clubbing, or edema.  2+ distal pulses.  BREASTS:  Symmetric with everted nipples.  No masses.  No  supraclavicular or axillary adenopathy.  GU:  Normal external female genitalia.  BUS normal.  Vagina is pink and  rugated.  Cervix is parous without lesions.  Uterus is small,  anteverted.  No adnexal mass or tenderness.   IMPRESSION:  1. GYN exam with Pap.  2. IUD string check.  Strings are in the appropriate place meaning IUD      is in the appropriate place.  She stated her husband felt the      string, but the string is currently wrapped up behind the cervix,      so I am unwilling to trim  the strings as I think feeling the      strings is probably positional related, and will get better over      time.  She will return as needed.           ______________________________  Tinnie Gens, MD     TP/MEDQ  D:  05/23/2010  T:  05/24/2010  Job:  [id number]

## 2010-11-14 NOTE — Group Therapy Note (Signed)
NAME:  [name], [name]               ACCOUNT NO.:  [id number]   MEDICAL RECORD NO.:  [id number]          PATIENT TYPE:  POB   LOCATION:  [facility]                   FACILITY:  [facility]   PHYSICIAN:  Allie Bossier, MD        DATE OF BIRTH:  [birth year]   DATE OF SERVICE:  12/26/2006                                  CLINIC NOTE   Patient comes to the office today for an IUD check.  She has had some  ongoing bleeding since her Mirena IUD was placed on Nov 27, 2006.  She  has admitted that this bleeding often does not require a panty liner or  tampon.  It is more a nuisance.  As a result, she and her husband are  not having sexual intercourse and that has become a problem in her  marriage.  She is interested in taking birth control pills, although her  blood pressure at this point is uncontrolled at 135/98.  She is advised  not to begin birth control pills.   PHYSICAL EXAM:  A well-developed, well-nourished Caucasian female in no  acute distress.  Blood pressure again 135/98, weight is 155, height is 5  feet, pulse is 74.  EXTERNAL GENITALIA:  Was within normal limits.  INTERNAL VAGINAL:  With a small amount of bleeding, IUD strings intact.   ASSESSMENT AND PLAN:  Vaginal bleeding related to intrauterine device.  Dr. Marice Potter had reassured her that she would have irregular bleeding for  some months following her intrauterine device.  She is again advised not  to start on birth control secondary to her high blood pressure.  She  will be getting health insurance in August and she is encouraged to see  a primary care physician for her high blood pressure.  Currently she is  taking atenolol 50 mg as well as hydrochlorothiazide 25 mg daily.  She  is encouraged to use Motrin 600 mg t.i.d., she is given a prescription  for #90 with 1 refill.  She is encouraged to check her blood pressure at  home and make sure that this does not in any way elevate her blood  pressure.  She will follow up in 1 month.  She  will call the office if  her blood pressure becomes elevated following the Motrin.     ______________________________  Lisa Roca, N.P.    ______________________________  Allie Bossier, MD    BR/MEDQ  D:  12/26/2006  T:  12/26/2006  Job:  [id number]

## 2010-11-14 NOTE — Assessment & Plan Note (Signed)
NAMEDANELI, [name]             ACCOUNT NO.:  [id number]   MEDICAL RECORD NO.:  [id number]          PATIENT TYPE:  POB   LOCATION:  [facility]         FACILITY:  [facility]   PHYSICIAN:  Ginger Carne, MD DATE OF BIRTH:  02/26/1981   DATE OF SERVICE:  02/26/2008                                  CLINIC NOTE   The patient comes to office today for consultation for migraine  headaches.  The patient is currently 18 weeks and 1 day pregnant.  She  is G4, A2, P1, this was an unplanned pregnancy following a Mirena that  fell out.  The patient does have a strong family history of migraines  including the mother who has very severe migraines.  The patient does  describe aura with her headaches.  She does have pain in her lower right  temple, can move to her left.  She is sensitive to light, sounds, not  smell.  She does have some nausea.  She is currently not vomiting.  In a  20-month period, she has approximately 4 severe, 6-7 moderate and 15  mild.  She is currently on atenolol 50 mg for blood pressure and her  blood pressure is well controlled on that.  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  She also has a 80-month-old and again  unplanned pregnancy.  Her husband has not been working.  He has been out  of work since May 2009, he has recently got a new job, but in the  meantime has been at home and has been rather unknowing.  She did talk  to Dr. Penne Lash in the last few weeks.  Dr. Penne Lash has recommended that  she take to 600 mg of Motrin up to 30 weeks.  The patient has been  somewhat fearful of doing that, is only taking 400 mg rarely.  She  states that it does some help mostly she takes Tylenol.  Her sleep is  okay.  She has been using Benadryl on an as-needed basis.   PHYSICAL EXAMINATION:  GENERAL:  Well-developed, well-nourished 30-year-  old Caucasian female, in no acute  distress.  VITAL SIGNS:  Blood pressure is 117/73, pulse is 74, weight is 176.  HEENT:  Head is normocephalic and atraumatic.  HEART:  Regular rate and rhythm.  LUNGS:  Clear bilaterally.  NEUROLOGIC:  The patient is intact with no obvious deficit.  Her speech  is fluid and she is slightly anxious appearing.  She has good muscle  tone and coordination.   ASSESSMENT:  1. Pregnancy.  2. Migraine with aura.   PLAN:  Concerned the patient is pregnant.  We will attempt to use as  many non-medication management techniques as possible.  We did have a  lengthy discussion on all non-medication management including yoga,  relaxation, meditation, physical therapy, massage therapy, exercise.  She will incorporate these in her lifestyle.  She will be given  Phenergan for her nausea.  She will be given Flexeril for muscle spasm  when she gets neck tension and that  causes her to move into  a migraine.  She will be given Vicodin to take  for rescue only.  She is given 10 tablets with 1 refill.  The patient  will follow up for her routine OB visits.  She will see me on an as-  needed basis only.      Remonia Richter, NP    ______________________________  Ginger Carne, MD    LR/MEDQ  D:  02/26/2008  T:  02/27/2008  Job:  [id number]

## 2010-11-14 NOTE — Assessment & Plan Note (Signed)
NAMEJENNINE, PEDDY             ACCOUNT NO.:  [id number]   MEDICAL RECORD NO.:  [id number]          PATIENT TYPE:  POB   LOCATION:  [facility] at [facility]         FACILITY:  [facility]   PHYSICIAN:  Carolanne Grumbling, M.D.   DATE OF BIRTH:  1981/04/06   DATE OF SERVICE:                                  CLINIC NOTE   CHIEF COMPLAINT:  Postpartum visit.   HISTORY OF PRESENT ILLNESS:  30 year old gravida III para I, 0-2-1,  status post NSVD with second-degree lacerations on 10/07/2006.  Patient  delivered a viable female, weight 6 lb 2 oz.  She was induced for  gestational diabetes A1.  Pregnancy also complicated by her chronic  hypertension.  Postpartum patient did well but has had to be seen for  headaches.  Her blood pressure was found to be under reasonable control  and she had no protein in her urine at that time.  She denied any signs  and symptoms of preeclampsia.  Patient endorses lots of discomfort in  her laceration area and noticing a bump.  She understands that it is  secondary to tissue reaction from the suture material.  She has not had  intercourse and is a little bit apprehensive.  She desires an IUD for  contraception.   Patient bonding well with her infant son.  She breast fed but had  trouble with her milk supply despite trying Reglan.  She is not breast  feeding anymore.  Concern her son is going to get an echocardiogram this  week for a heart murmur.   PHYSICAL EXAMINATION:  VITAL SIGNS:  Pulse:  65.  Blood pressure:  139/87.  Weight:  161 down from 179 when she was pregnant.  Height:  5  foot.  GENERAL:  Well-developed, well-nourished female in no apparent distress.  HEENT:  Normocephalic, atraumatic.  Neck is supple.  No thyromegaly.  CARDIOVASCULAR:  Regular rate and rhythm.  LUNGS:  Clear to auscultation bilaterally.  ABDOMEN:  Soft, nontender, and nondistended.  GENITOURINARY:  Normal external genitalia.  There is evidence of a well-  healed laceration.  There was  suture material plainly visible that was  not connected anymore so I removed this.  Bimanual exam, adnexa was free  of masses.  Uterus was involuted and retroverted.  No cervical motion  tenderness.  EXTREMITIES:  No clubbing, cyanosis or edema.   IMPRESSION:  1. Postpartum exam.  Patient doing well.  She will return to get an      intrauterine device placed.  Her last gonorrhea and chlamydia were      negative in March so she is up-to-date on that.  Pap smear will be      due in September.  2. Chronic hypertension.  Blood pressure is not at goal on atenolol 50      mg daily, so I will add hydrochlorothiazide 25 mg daily.  She will      get her comprehensive metabolic panel approximately a week after      starting this to make sure that her potassium is okay.  3. Gestational diabetes.  We will do a 2 hour glucose tolerance test.      I explained  that she does have a strong family history so this      needs to be evaluated every couple of years.           ______________________________  Carolanne Grumbling, M.D.     TW/MEDQ  D:  11/14/2006  T:  11/14/2006  Job:  [id number]

## 2010-11-14 NOTE — Assessment & Plan Note (Signed)
[name], [name]             ACCOUNT NO.:  [id number]   MEDICAL RECORD NO.:  [id number]          PATIENT TYPE:  POB   LOCATION:  [facility]         FACILITY:  [facility]   PHYSICIAN:  Tinnie Gens, MD        DATE OF BIRTH:  06/08/1981   DATE OF SERVICE:  08/31/2008                                  CLINIC NOTE   CHIEF COMPLAINT:  Postpartum checkup.   HISTORY OF PRESENT ILLNESS:  The patient is a 30 year old gravida 4,  para 2-0-2-2 who is 6 weeks postpartum from a vaginal delivery of a female  infant.  The patient complains of illness that has been going on for the  last 2 weeks.  She has tooth pain and sinus pressure.  The patient also  complains of not having any medications to treat her blood pressure.  She has been off medication for 4-6 weeks.  The patient is bottle  feeding.  She reports the baby sleeps through the night sometimes and  the weight is up to 10 pounds and has done much better since he got out  of hospital after being treated for RSV at 1 month of age.  The patient  is interested in Mirena IUD.   PHYSICAL EXAMINATION:  VITAL SIGNS:  Her blood pressure is 158/112  today.  Her weight is 172, pulse is 71.  GENERAL:  She is a well-developed, well-nourished female in no acute  distress.  GU:  Normal external female genitalia.  BUS normal.  Vagina is pink and  rugated.  Cervix is parous without lesion.  Uterus is small, anteverted.  No adnexal mass or tenderness.   IMPRESSION:  1. Postpartum check, doing well.  2. Desires Mirena.  3. Elevated blood pressure.  4. Acute sinusitis.   PLAN:  1. Refill her atenolol 50 mg p.o. daily.  This is what she was on      during pregnancy.  2. Return in 1-2 weeks for Mirena IUD insertion.  Please remain      abstinent during this time.  3. Prescription for Zithromax was given to the patient as well as      Zyrtec and Mucinex to be used to treat her sinus pain and pressure.           ______________________________  Tinnie Gens, MD     TP/MEDQ  D:  08/31/2008  T:  09/01/2008  Job:  [id number]

## 2011-01-25 ENCOUNTER — Ambulatory Visit (INDEPENDENT_AMBULATORY_CARE_PROVIDER_SITE_OTHER): Payer: Self-pay | Admitting: *Deleted

## 2011-01-25 ENCOUNTER — Encounter: Payer: Self-pay | Admitting: *Deleted

## 2011-01-25 VITALS — BP 118/87 | Ht 64.0 in | Wt 177.0 lb

## 2011-01-25 DIAGNOSIS — Z3480 Encounter for supervision of other normal pregnancy, unspecified trimester: Secondary | ICD-10-CM

## 2011-01-25 DIAGNOSIS — O3680X0 Pregnancy with inconclusive fetal viability, not applicable or unspecified: Secondary | ICD-10-CM

## 2011-01-26 LAB — OBSTETRIC PANEL
Basophils Absolute: 0 10*3/uL (ref 0.0–0.1)
Eosinophils Absolute: 0.1 10*3/uL (ref 0.0–0.7)
Eosinophils Relative: 1 % (ref 0–5)
Hepatitis B Surface Ag: NEGATIVE
Lymphs Abs: 2.7 10*3/uL (ref 0.7–4.0)
MCH: 32.2 pg (ref 26.0–34.0)
MCV: 93.3 fL (ref 78.0–100.0)
Neutrophils Relative %: 63 % (ref 43–77)
Platelets: 241 10*3/uL (ref 150–400)
RBC: 4.35 MIL/uL (ref 3.87–5.11)
RDW: 13.1 % (ref 11.5–15.5)
WBC: 9.4 10*3/uL (ref 4.0–10.5)

## 2011-01-26 LAB — HIV ANTIBODY (ROUTINE TESTING W REFLEX): HIV: NONREACTIVE

## 2011-01-27 LAB — CULTURE, URINE COMPREHENSIVE
Colony Count: NO GROWTH
Organism ID, Bacteria: NO GROWTH

## 2011-01-28 ENCOUNTER — Emergency Department: Payer: Self-pay | Admitting: Emergency Medicine

## 2011-02-07 ENCOUNTER — Ambulatory Visit (HOSPITAL_COMMUNITY)
Admission: RE | Admit: 2011-02-07 | Discharge: 2011-02-07 | Disposition: A | Discharge status: Home / Self Care | Payer: Medicaid Other | Source: Ambulatory Visit | Attending: Obstetrics and Gynecology | Admitting: Obstetrics and Gynecology

## 2011-02-07 DIAGNOSIS — O26849 Uterine size-date discrepancy, unspecified trimester: Secondary | ICD-10-CM | POA: Insufficient documentation

## 2011-02-07 DIAGNOSIS — O3680X0 Pregnancy with inconclusive fetal viability, not applicable or unspecified: Secondary | ICD-10-CM

## 2011-02-07 DIAGNOSIS — Z3689 Encounter for other specified antenatal screening: Secondary | ICD-10-CM | POA: Insufficient documentation

## 2011-02-07 IMAGING — US US OB COMP LESS 14 WK
1 series · 13 of 28 positions shown · non-contrast
Comparison: none

[Series 1: us ob comp less 14 wks · 13 of 42 slices shown]
[im 2/42]
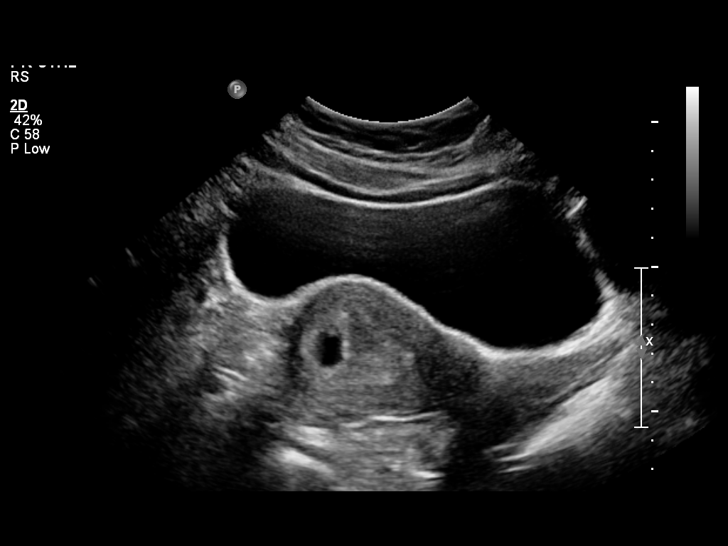
[im 5/42]
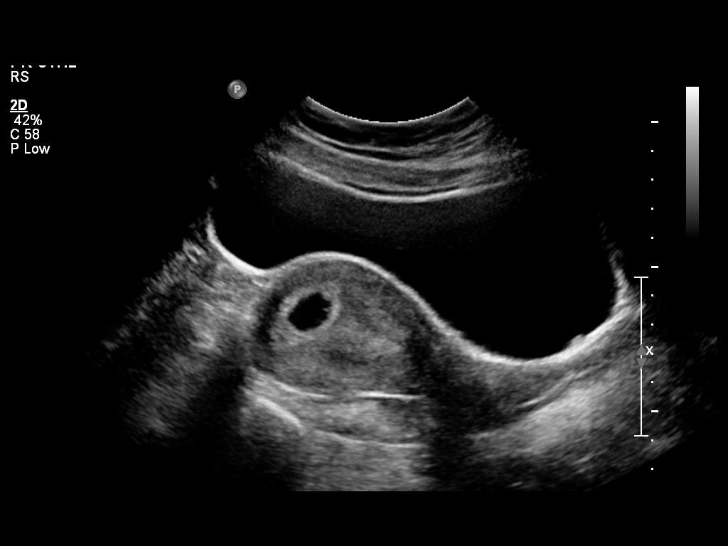
[im 8/42]
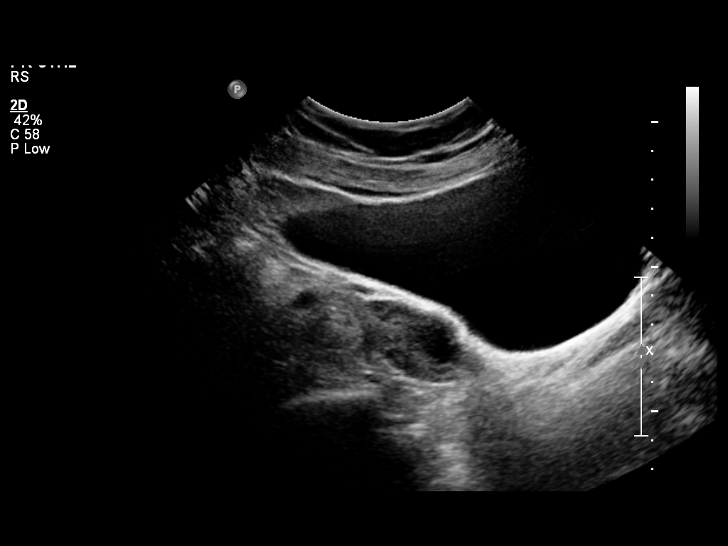
[im 11/42]
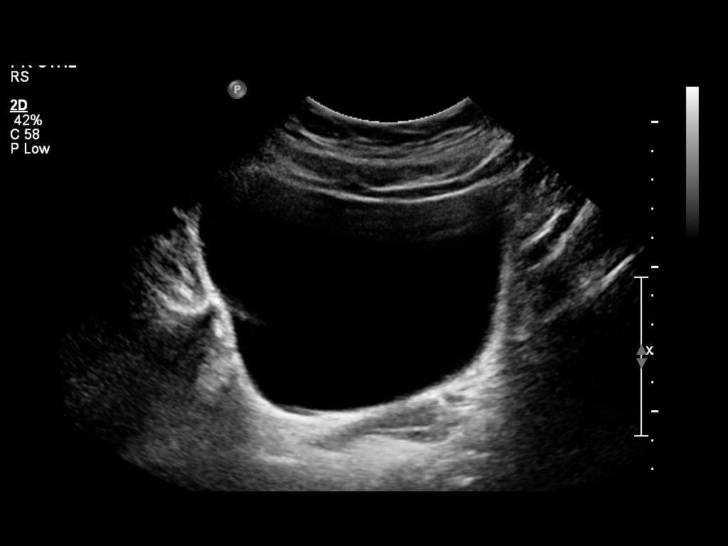
[im 14/42]
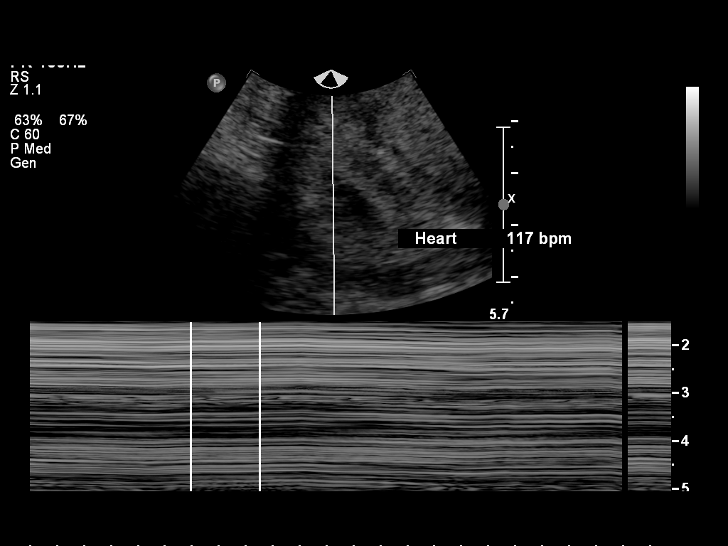
[im 17/42]
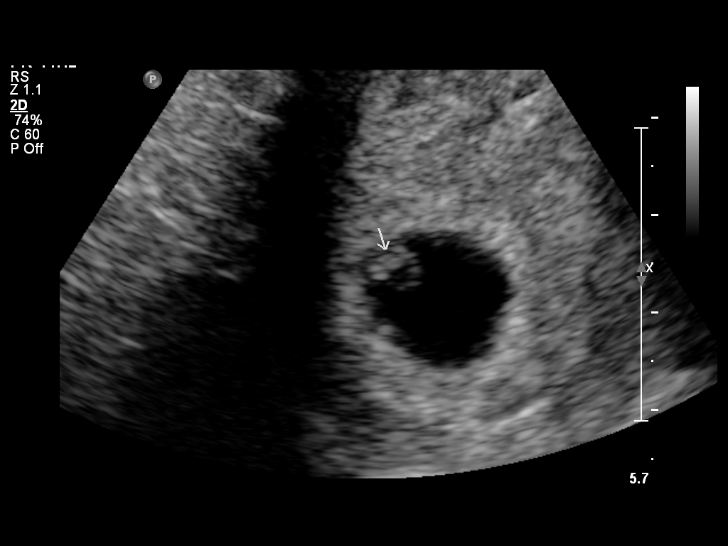
[im 22/42]
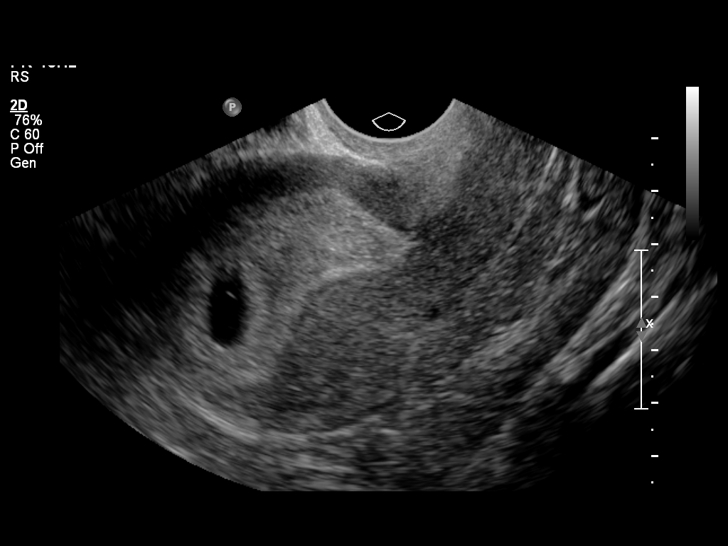
[im 25/42]
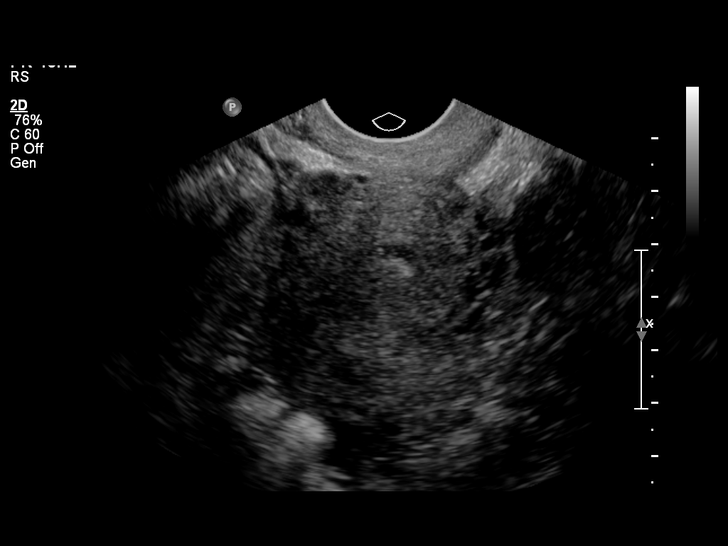
[im 28/42]
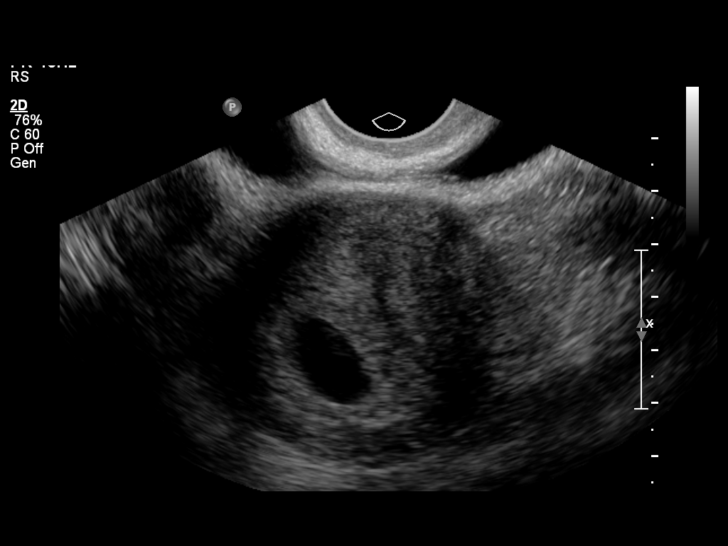
[im 31/42]
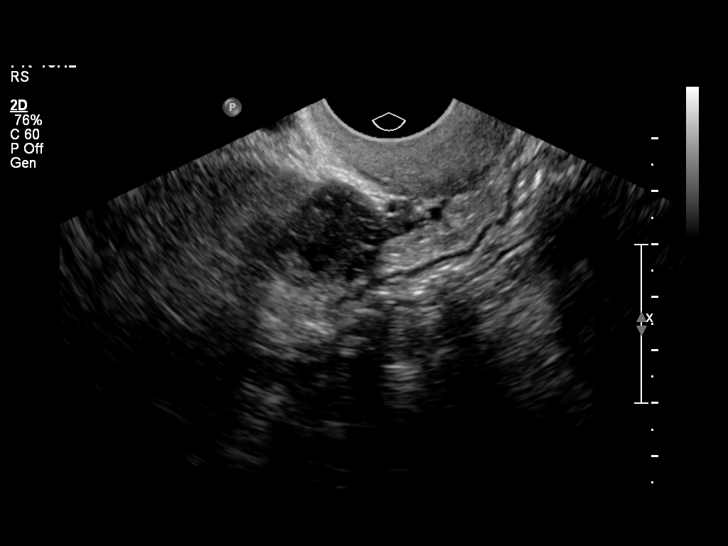
[im 34/42]
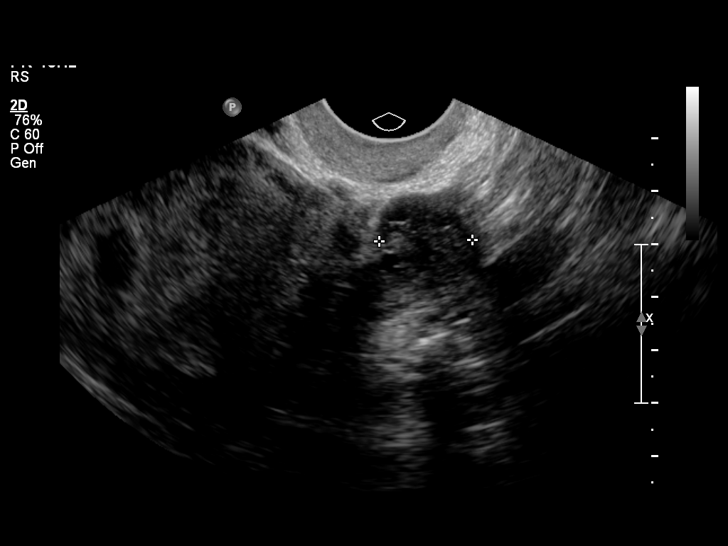
[im 37/42]
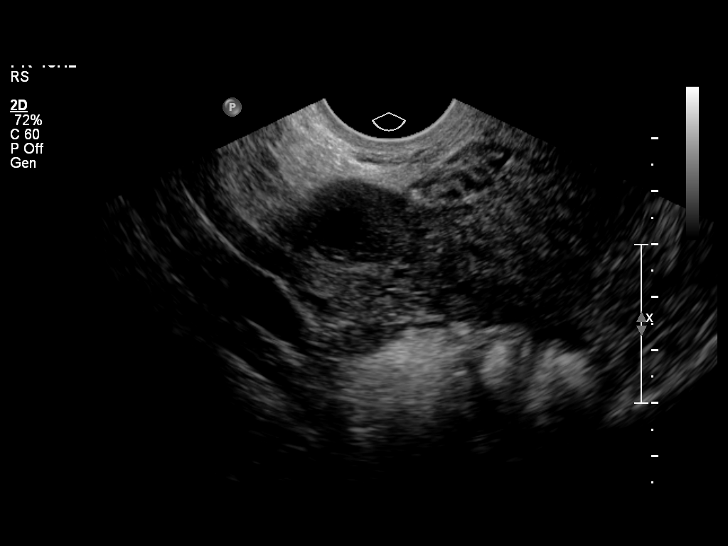
[im 40/42]
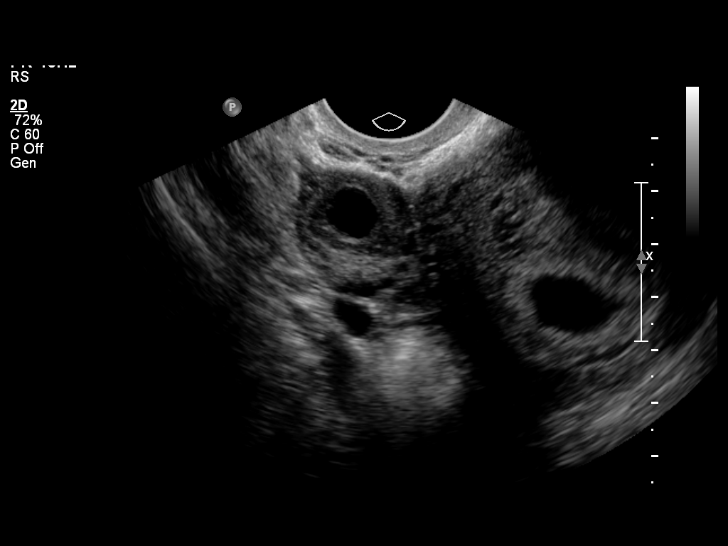

[13 of 28 positions shown; findings below may reference images not displayed]

OBSTETRICS REPORT
                      (Signed Final [DATE] [DATE])

 Order#:         [PHONE_NUMBER]_O,[KH]
                 50_O
Procedures

 US OB TRANSVAGINAL                                    76817.0
 US OB COMP LESS 14 WKS                                76801.0
Indications

 Size-Date Discrepancy
 Dating
Fetal Evaluation

 Preg. Location:    Intrauterine
 Gest. Sac:         Intrauterine
 Yolk Sac:          Visualized
 Fetal Pole:        Visualized
 Fetal Heart Rate:  117                          bpm
 Cardiac Activity:  Observed

 Amniotic Fluid
 AFI FV:      Subjectively within normal limits
Biometry

 CRL:      4.1  mm     G. Age:  6w 1d                  EDD:    [DATE]
Gestational Age

 LMP:           9w 4d         Date:  [DATE]                 EDD:   [DATE]
 Best:          6w 1d      Det. By:  U/S C R L ([DATE])     EDD:   [DATE]
Cervix Uterus Adnexa

 Cervix:       Closed.
 Left Ovary:    Within normal limits. 2.2cm x 1.5cm x 1.8cm
 Right Ovary:   Small corpus luteum noted. 2.9cm x 2.4cm x 2.3cm

 Adnexa:     No abnormality visualized.
Impression

 Single living IUP with US Gest. Age of 6w 1d, and EDD of
 [DATE]. Suggest using EGA based on today's ultrasound.
Recommendations

 US for fetal anatomic evaluation at 18-19 wks GA.

## 2011-02-13 ENCOUNTER — Encounter: Payer: Self-pay | Admitting: Obstetrics and Gynecology

## 2011-02-13 ENCOUNTER — Ambulatory Visit (INDEPENDENT_AMBULATORY_CARE_PROVIDER_SITE_OTHER): Payer: Self-pay | Admitting: Obstetrics and Gynecology

## 2011-02-13 VITALS — BP 116/77 | Wt 179.0 lb

## 2011-02-13 DIAGNOSIS — Z3480 Encounter for supervision of other normal pregnancy, unspecified trimester: Secondary | ICD-10-CM

## 2011-02-13 DIAGNOSIS — Z1272 Encounter for screening for malignant neoplasm of vagina: Secondary | ICD-10-CM

## 2011-02-13 DIAGNOSIS — I10 Essential (primary) hypertension: Secondary | ICD-10-CM

## 2011-02-13 DIAGNOSIS — Z3490 Encounter for supervision of normal pregnancy, unspecified, unspecified trimester: Secondary | ICD-10-CM

## 2011-02-13 MED ORDER — LABETALOL HCL 200 MG PO TABS: 200.0000 mg | ORAL_TABLET | Freq: Two times a day (BID) | ORAL | Status: DC

## 2011-02-13 MED ORDER — CONCEPT OB 130-92.4-1 MG PO CAPS: 1.0000 | ORAL_CAPSULE | Freq: Every day | ORAL | Status: DC

## 2011-02-13 MED ORDER — PROMETHAZINE HCL 12.5 MG PO TABS: 12.5000 mg | ORAL_TABLET | Freq: Four times a day (QID) | ORAL | Status: AC | PRN

## 2011-02-13 NOTE — Progress Notes (Signed)
Subjective:    [name] is a [id number] [redacted]w[redacted]d being seen today for her first obstetrical visit. Patient does intend to breast feed. Pregnancy history fully reviewed.  Patient reports nausea and is eating well.  Filed Vitals:   02/13/11 1451  BP: 116/77  Weight: 179 lb (81.194 kg)    HISTORY: OB History    Grav Para Term Preterm Abortions TAB SAB Ect Mult Living   5 2 2  0 2 2 0 0 0 2     # Outc Date GA Lbr Len/2nd Wgt Sex Del Anes PTL Lv   1 TAB 2002           2 TAB 2003           3 TRM 4/08 [redacted]w[redacted]d 05:00 6lb2oz(2.778kg) M SVD EPI No Yes   4 TRM 1/10 [redacted]w[redacted]d 10:00 6lb13oz(3.09kg) M SVD EPI No Yes   5 CUR              Past Medical History  Diagnosis Date  . Hypertension   . Gestational diabetes   . Migraine    Past Surgical History  Procedure Date  . Tonsillectomy 1988  . Tubes in ears Baby   Family History  Problem Relation Age of Onset  . Diabetes Mother   . Osteoarthritis Mother   . Hypertension Mother   . Hypothyroidism Mother   . Heart disease Mother   . Hyperlipidemia Mother   . Migraines Mother   . Mental illness Mother     Depression  . Hypertension Father   . Heart failure Father   . Hepatitis Father   . Cirrhosis Father   . Hyperlipidemia Father   . Migraines Father   . Mental illness Father     Depression  . Breast cancer Maternal Aunt 70  . Cancer Sister 30    Cervical  . Heart disease Sister     Heart Stops  . Migraines Sister   . Seizures Sister 11    unsure if this or heart problem  . Mental illness Sister     depression  . Heart disease Maternal Grandmother      Exam    Uterine Size: size equals dates  Pelvic Exam:    Perineum: No Hemorrhoids, Normal Perineum   Vulva: normal   Vagina:  normal mucosa   pH:    Cervix: no bleeding following Pap, no cervical motion tenderness and no lesions   Adnexa: no mass, fullness, tenderness   Bony Pelvis: adequate  System: Breast:  normal appearance, no masses or tenderness, No nipple  retraction or dimpling, No nipple discharge or bleeding   Skin: normal coloration and turgor, no rashes    Neurologic: oriented, grossly non-focal   Extremities: normal strength, tone, and muscle mass   HEENT extra ocular movement intact   Mouth/Teeth mucous membranes moist, pharynx normal without lesions   Neck supple and no masses   Cardiovascular: regular rate and rhythm   Respiratory:  chest clear, no wheezing, crepitations, rhonchi, normal symmetric air entry   Abdomen: soft, non-tender; bowel sounds normal; no masses,  no organomegaly   Urinary:       Assessment:    Pregnancy: [id number] Patient Active Problem List  Diagnoses  . HTN (hypertension), benign        Plan:     Initial labs drawn. PIH panel and 24hr urine for protein ordered Prenatal vitamins. Problem list reviewed and updated. Genetic Screening discussed Integrated Screen: ordered.  Ultrasound discussed; fetal  survey: will be ordered at a later visit.  Follow up in 4 weeks. Rx changed to labetalol 200mg  BID  [name] 02/13/2011

## 2011-02-13 NOTE — Progress Notes (Signed)
Addended by: Barbara Cower on: 02/13/2011 04:16 PM   Modules accepted: Orders

## 2011-02-13 NOTE — Patient Instructions (Signed)
Pregnancy - First Trimester During sexual intercourse, millions of sperm go into the vagina. Only 1 sperm will penetrate and fertilize the female egg while it is in the Fallopian tube. One week later, the fertilized egg implants into the wall of the uterus. An embryo begins to develop into a baby. At 6 to 8 weeks, the eyes and face are formed and the heartbeat can be seen on ultrasound. At the end of 12 weeks (first trimester), all the baby's organs are formed. Now that you are pregnant, you will want to do everything you can to have a healthy baby. Two of the most important things are to get good prenatal care and follow your caregiver's instructions. Prenatal care is all the medical care you receive before the baby's birth. It is given to prevent, find and treat problems during the pregnancy and childbirth. PRENATAL EXAMS:  During prenatal visits, your weight, blood pressure and urine are checked. This is done to make sure you are healthy and progressing normally during the pregnancy.   A pregnant woman should gain 25 to 35 pounds during the pregnancy. However, if you are over weight or underweight, your caregiver will advise you regarding your weight.   Your caregiver will ask and answer questions for you.   Blood work, cervical cultures, other necessary tests and a Pap test are done during your prenatal exams. These tests are done to check on your health and the probable health of your baby. Tests are strongly recommended and done for HIV with your permission. This is the virus that causes AIDS. These tests are done because medications can be given to help prevent your baby from being born with this infection should you have been infected without knowing it. Blood work is also used to find out your blood type, previous infections and follow your blood levels (hemoglobin).   Low hemoglobin (anemia) is common during pregnancy. Iron and vitamins are given to help prevent this. Later in the pregnancy,  blood tests for diabetes will be done along with any other tests if any problems develop. You may need tests to make sure you and the baby are doing well.   You may need other tests to make sure you and the baby are doing well.  CHANGES DURING THE FIRST TRIMESTER (THE FIRST 3 MONTHS OF PREGNANCY) Your body goes through many changes during pregnancy. They vary from person to person. Talk to your caregiver about changes you notice and are concerned about. Changes can include:  Your menstrual period stops.   The egg and sperm carry the genes that determine what you look like. Genes from you and your partner are forming a baby. The female genes determine whether the baby is a boy or a girl.   Your body increases in girth and you may feel bloated.   Feeling sick to your stomach (nauseous) and throwing up (vomiting). If the vomiting is uncontrollable, call your caregiver.   Your breasts will begin to enlarge and become tender.   Your nipples may stick out more and become darker.   The need to urinate more. Painful urination may mean you have a bladder infection.   Tiring easily.   Loss of appetite.   Cravings for certain kinds of food.   At first, you may gain or lose a couple of pounds.   You may have changes in your emotions from day to day (excited to be pregnant or concerned something may go wrong with the pregnancy and baby).     You may have more vivid and strange dreams.  HOME CARE INSTRUCTIONS  It is very important to avoid all smoking, alcohol and un-prescribed drugs during your pregnancy. These affect the formation and growth of the baby. Avoid chemicals while pregnant to ensure the delivery of a healthy infant.   Start your prenatal visits by the 12th week of pregnancy. They are usually scheduled monthly at first, then more often in the last 2 months before delivery. Keep your caregiver's appointments. Follow your caregiver's instructions regarding medication use, blood and lab  tests, exercise, and diet.   During pregnancy, you are providing food for you and your baby. Eat regular, well-balanced meals. Choose foods such as meat, fish, milk and other low fat dairy products, vegetables, fruits, and whole-grain breads and cereals. Your caregiver will tell you of the ideal weight gain.   You can help morning sickness by keeping soda crackers (saltines) at the bedside. Eat a couple before arising in the morning. You may want to use the crackers without salt on them.   Eating 4 to 5 small meals rather than 3 large meals a day also may help the nausea and vomiting.   Drinking liquids between meals instead of during meals also seems to help nausea and vomiting.   A physical sexual relationship may be continued throughout pregnancy if there are no other problems. Problems may be early (premature) leaking of amniotic fluid from the membranes, vaginal bleeding, or belly (abdominal) pain.   Exercise regularly if there are no restrictions. Check with your caregiver or physical therapist if you are unsure of the safety of some of your exercises. Greater weight gain will occur in the last 2 trimesters of pregnancy. Exercising will help:   Control your weight.   Keep you in shape.   Prepare you for labor and delivery.   Help you lose your pregnancy weight after you deliver your baby.   Wear a good support or jogging bra for breast tenderness during pregnancy. This may help if worn during sleep too.   Ask when prenatal classes are available. Begin classes when they are offered.   Do not use hot tubs, steam rooms or saunas.   Wear your seat belt when driving. This protects you and your baby if you are in an accident.   Avoid raw meat, uncooked cheese, cat litter boxes and soil used by cats throughout the pregnancy. These carry germs that can cause birth defects in the baby.   The first trimester is a good time to visit your dentist for your dental health. Getting your teeth  cleaned is OK. Use a softer toothbrush and brush gently during pregnancy.   Ask for help if you have financial, counseling or nutritional needs during pregnancy. Your caregiver will be able to offer counseling for these needs as well as refer you for other special needs.   Do not take any medications or herbs unless told by your caregiver.   Inform your caregiver if there is any mental or physical domestic violence.   Make a list of emergency phone numbers of family, friends, hospital, police and fire department.   Write down your questions. Take them to your prenatal visit.   Do not douche.   Do not cross your legs.   If you have to stand for long periods of time, rotate you feet or take small steps in a circle.   You may have more vaginal secretions that may require a sanitary pad. Do not use tampons   or scented sanitary pads.  MEDICATIONS AND DRUG USE IN PREGNANCY  Take prenatal vitamins as directed. The vitamin should contain 1 milligram of folic acid. Keep all vitamins out of reach of children. Only a couple vitamins or tablets containing iron may be fatal to a baby or young child when ingested.   Avoid use of all medications, including herbs, over-the-counter medications, not prescribed or suggested by your caregiver. Only take over-the-counter or prescription medicines for pain, discomfort, or fever as directed by your caregiver. Do not use aspirin, ibuprofen (Motrin, Advil, Nuprin) or naproxen (Aleve) unless OK'd by your caregiver.   Let your caregiver also know about herbs you may be using.   Alcohol is related to a number of birth defects. This includes fetal alcohol syndrome. All alcohol, in any form, should be avoided completely. Smoking will cause low birth rate and premature babies.   Street/illegal drugs are very harmful to the baby. They are absolutely forbidden. A baby born to an addicted mother will be addicted at birth. The baby will go through the same withdrawal  an adult does.   Let your caregiver know about any medications that you have to take and for what reason you take them.  MISCARRIAGE IS COMMON DURING PREGNANCY A miscarriage does not mean you did something wrong. It is not a reason to worry about getting pregnant again. Your caregiver will help you with questions you may have. If you have a miscarriage, you may need minor surgery (a D & C). SEEK MEDICAL CARE IF:  You have any concerns or worries during your pregnancy. It is better to call with your questions if you feel they cannot wait, rather than worry about them.  SEEK IMMEDIATE MEDICAL CARE IF:  An unexplained oral temperature above 100.4 develops, or as your caregiver suggests.   You have leaking of fluid from the vagina (birth canal). If leaking membranes are suspected, take your temperature and inform your caregiver of this when you call.   There is vaginal spotting or bleeding. Notify your caregiver of the amount and how many pads are used.   You develop a bad smelling vaginal discharge with a change in the color.   You continue to feel sick to your stomach (nauseated) and have no relief from remedies suggested. You vomit blood or coffee ground like materials.   You lose more than 2 pounds of weight in one week.   You gain more than 2 pounds of weight in a week and you notice swelling of your face, hands, feet or legs.   You gain 5 pounds or more in 1 week (even if you do not have swelling of your hands, face, legs or feet).   You get exposed to German measles and have never had them.   You are exposed to fifth disease or chicken pox.   You develop belly (abdominal) pain. Round ligament discomfort is a common non-cancerous (benign) cause of abdominal pain in pregnancy. Your caregiver still must evaluate this.   You develop headache, fever, diarrhea, pain with urination, or shortness of breath.   You fall, are in a car accident or have any kind of trauma.   There is mental  or physical violence in your home.  Document Released: 06/12/2001 Document Re-Released: 12/06/2009 [employer] Patient Information 2011 [employer]. 

## 2011-02-19 ENCOUNTER — Other Ambulatory Visit (INDEPENDENT_AMBULATORY_CARE_PROVIDER_SITE_OTHER): Payer: Self-pay | Admitting: *Deleted

## 2011-02-19 DIAGNOSIS — O169 Unspecified maternal hypertension, unspecified trimester: Secondary | ICD-10-CM

## 2011-02-19 DIAGNOSIS — IMO0002 Reserved for concepts with insufficient information to code with codable children: Secondary | ICD-10-CM

## 2011-02-19 LAB — COMPREHENSIVE METABOLIC PANEL
CO2: 21 mEq/L (ref 19–32)
Glucose, Bld: 79 mg/dL (ref 70–99)
Sodium: 136 mEq/L (ref 135–145)
Total Bilirubin: 0.3 mg/dL (ref 0.3–1.2)
Total Protein: 7 g/dL (ref 6.0–8.3)

## 2011-02-19 LAB — URIC ACID: Uric Acid, Serum: 2.6 mg/dL (ref 2.4–7.0)

## 2011-02-19 NOTE — Progress Notes (Signed)
Patient is bringing by her 24 hour urine collection and needs blood drawn.  She is concerned about the change of her Blood Pressure medication from Atenolol to Labetolol as she has not ever been switched in the past.  I told her I would check on this for her and give her a call back.  She feels more comfortable taking the same thing that she has been for several years.

## 2011-02-19 NOTE — Progress Notes (Signed)
Addended by: Barbara Cower on: 02/19/2011 03:35 PM   Modules accepted: Orders

## 2011-02-20 LAB — CBC WITH DIFFERENTIAL/PLATELET
Eosinophils Relative: 1 % (ref 0–5)
Lymphocytes Relative: 24 % (ref 12–46)
Lymphs Abs: 1.9 10*3/uL (ref 0.7–4.0)
MCV: 95 fL (ref 78.0–100.0)
Neutro Abs: 5.3 10*3/uL (ref 1.7–7.7)
Neutrophils Relative %: 67 % (ref 43–77)
Platelets: 227 10*3/uL (ref 150–400)
RBC: 4.02 MIL/uL (ref 3.87–5.11)
WBC: 8 10*3/uL (ref 4.0–10.5)

## 2011-02-21 ENCOUNTER — Telehealth: Payer: Self-pay | Admitting: *Deleted

## 2011-02-21 NOTE — Telephone Encounter (Signed)
Message copied by Barbara Cower on Wed Feb 21, 2011  5:58 PM ------      Message from: CONSTANT, PEGGY      Created: Wed Feb 21, 2011  9:02 AM       I am ok with her staying on the Atenolol. You can reassure her that other patients are currently taking that medications without any complications.            Thank you      ----- Message -----         From: Barbara Cower, CMA         Sent: 02/19/2011   2:42 PM           To: Catalina Antigua, MD

## 2011-02-22 ENCOUNTER — Other Ambulatory Visit (INDEPENDENT_AMBULATORY_CARE_PROVIDER_SITE_OTHER): Payer: Self-pay | Admitting: *Deleted

## 2011-02-22 DIAGNOSIS — R350 Frequency of micturition: Secondary | ICD-10-CM

## 2011-02-22 LAB — POCT URINALYSIS DIPSTICK
Nitrite, UA: NEGATIVE
Protein, UA: NEGATIVE
Urobilinogen, UA: NEGATIVE

## 2011-02-22 MED ORDER — SULFAMETHOXAZOLE-TRIMETHOPRIM 800-160 MG PO TABS: 1.0000 | ORAL_TABLET | Freq: Two times a day (BID) | ORAL | Status: AC

## 2011-02-22 NOTE — Progress Notes (Signed)
Patient complains of increase back pain, some lower quadrant pain and increased frequency with urination.  Urine show leukocytes and trace blood.  We will call in Bactrim DS for her and send her urine for culture.

## 2011-02-25 LAB — CULTURE, URINE COMPREHENSIVE: Colony Count: 6000

## 2011-03-07 ENCOUNTER — Inpatient Hospital Stay (HOSPITAL_COMMUNITY): Payer: Medicaid Other

## 2011-03-07 ENCOUNTER — Encounter (HOSPITAL_COMMUNITY): Payer: Self-pay | Admitting: *Deleted

## 2011-03-07 ENCOUNTER — Inpatient Hospital Stay (HOSPITAL_COMMUNITY)
Admission: AD | Admit: 2011-03-07 | Discharge: 2011-03-07 | Disposition: A | Discharge status: Home / Self Care | Payer: Medicaid Other | Source: Ambulatory Visit | Attending: Obstetrics & Gynecology | Admitting: Obstetrics & Gynecology

## 2011-03-07 ENCOUNTER — Other Ambulatory Visit: Payer: Self-pay | Admitting: *Deleted

## 2011-03-07 DIAGNOSIS — G43909 Migraine, unspecified, not intractable, without status migrainosus: Secondary | ICD-10-CM | POA: Insufficient documentation

## 2011-03-07 DIAGNOSIS — O99891 Other specified diseases and conditions complicating pregnancy: Secondary | ICD-10-CM | POA: Insufficient documentation

## 2011-03-07 DIAGNOSIS — O021 Missed abortion: Secondary | ICD-10-CM | POA: Insufficient documentation

## 2011-03-07 DIAGNOSIS — O10019 Pre-existing essential hypertension complicating pregnancy, unspecified trimester: Secondary | ICD-10-CM | POA: Insufficient documentation

## 2011-03-07 DIAGNOSIS — O99810 Abnormal glucose complicating pregnancy: Secondary | ICD-10-CM | POA: Insufficient documentation

## 2011-03-07 NOTE — ED Notes (Signed)
Pt tearful, very anxious

## 2011-03-07 NOTE — Progress Notes (Signed)
Had been really nauseous, symptoms had gone away, was feeling really scared.  So went to office, NO FH on [place]. Sent in , was told Dr Shawnie Pons would  Be expecting her. Denies pain or bleeding.

## 2011-03-07 NOTE — Progress Notes (Signed)
Comfort pack, and pillow given to pt and husband.  Pt teary, support and tissues given to pt.  Husband supportive at bedside.

## 2011-03-07 NOTE — ED Provider Notes (Signed)
History   Pt presents today c/o not feeling pregnant anymore. She states she was seen in the [facility] office by a nurse. The nurse briefly looked with a bedside US and could not see cardiac activity. Therefore, the pt was sent to the MAU for evaluation. The pt denies abd pain, vag dc, bleeding, or any other sx at this time.  No chief complaint on file.  HPI  OB History    Grav Para Term Preterm Abortions TAB SAB Ect Mult Living   5 2 2  0 2 2 0 0 0 2      Past Medical History  Diagnosis Date  . Hypertension   . Gestational diabetes   . Migraine     Past Surgical History  Procedure Date  . Tonsillectomy 1988  . Tubes in ears Baby    Family History  Problem Relation Age of Onset  . Diabetes Mother   . Osteoarthritis Mother   . Hypertension Mother   . Hypothyroidism Mother   . Heart disease Mother   . Hyperlipidemia Mother   . Migraines Mother   . Mental illness Mother     Depression  . Hypertension Father   . Heart failure Father   . Hepatitis Father   . Cirrhosis Father   . Hyperlipidemia Father   . Migraines Father   . Mental illness Father     Depression  . Breast cancer Maternal Aunt 70  . Cancer Sister 13    Cervical  . Heart disease Sister     Heart Stops  . Migraines Sister   . Seizures Sister 11    unsure if this or heart problem  . Mental illness Sister     depression  . Heart disease Maternal Grandmother     History  Substance Use Topics  . Smoking status: Current Everyday Smoker -- 0.5 packs/day for 12 years    Types: Cigarettes  . Smokeless tobacco: Never Used  . Alcohol Use: No    Allergies: No Known Allergies  Prescriptions prior to admission  Medication Sig Dispense Refill  . atenolol (TENORMIN) 50 MG tablet Take 50 mg by mouth daily.        Burnis Medin w/o A Vit-FeFum-FePo-FA (CONCEPT OB) 130-92.4-1 MG CAPS Take 1 capsule by mouth daily.  30 capsule  11    Review of Systems  Constitutional: Negative for fever.  Cardiovascular: Negative  for chest pain.  Gastrointestinal: Negative for nausea, vomiting, abdominal pain, diarrhea and constipation.  Genitourinary: Negative for dysuria, urgency, frequency and hematuria.  Neurological: Negative for dizziness and headaches.  Psychiatric/Behavioral: Negative for depression and suicidal ideas.   Physical Exam   Blood pressure 135/89, pulse 88, temperature 98.3 F (36.8 C), temperature source Oral, resp. rate 18, height 5\' 4"  (1.626 m), weight 180 lb 3.2 oz (81.738 kg), last menstrual period 12/02/2010.  Physical Exam  Constitutional: She is oriented to person, place, and time. She appears well-developed and well-nourished. No distress.  HENT:  Head: Normocephalic and atraumatic.  Eyes: EOM are normal. Pupils are equal, round, and reactive to light.  GI: Soft. She exhibits no distension. There is no tenderness. There is no rebound and no guarding.  Neurological: She is alert and oriented to person, place, and time.  Skin: Skin is warm and dry. She is not diaphoretic.  Psychiatric: She has a normal mood and affect. Her behavior is normal. Judgment and thought content normal.    MAU Course  Procedures  Care of this pt has  been transferred to Jeani Sow, FNP.  Called and reported to Dr. Despina Hidden the patient's hx, chief complaint and u/s findings of fetal demise.  He said he will be here to discuss with the patient.  I went in and told the patient.    Assessment and Plan    Clinton Gallant. Rice III, DrHSc, MPAS, PA-C  03/07/2011, 7:01 PM   Henrietta Hoover, PA 09/ Dr. Despina Hidden came in to see the patient, I had no direct care.    Matt Holmes, NP 03/15/11 1330

## 2011-03-07 NOTE — Progress Notes (Signed)
Patient would like FHR check due to she has had loss of pregnancy symptoms.  Her nausea is gone and her energy is better.  She is concerned. We will check FHR by ultrasound due to she is just over 10 weeks.  I am unable to see movement or Fetal Heart Rate.  I advised patient to go to MAU for further evaluation.  She agrees.

## 2011-03-07 NOTE — Progress Notes (Signed)
Pt went to office due to "feeling different", wanted to make sure everything was okay.  Was at office, had a bedside US done and they saw a baby, but did not see heartbeat- had an u/s at 6 weeks- saw a heartbeat.  Was sent over here for further evaluation.  Denies any bleeding or leaking of fluid.

## 2011-03-08 ENCOUNTER — Telehealth: Payer: Self-pay | Admitting: *Deleted

## 2011-03-08 NOTE — ED Provider Notes (Signed)
I tried to add on to previous note but was unable to it was locked.  Patient and husband Were informed she had suffered a pregnancy loss.  I spent about 30 minutes talking with them about a whole range of issues, including causes, management etc.  At this point they want some time to process this loss, but i think are leaning toward using cytotec in the coming days.  The patient will call [facility] when they decide to proceed with that plan.  They did not want to decide last night.  [name] 03/08/2011 7:41 AM

## 2011-03-08 NOTE — Telephone Encounter (Signed)
Spoke with MFM to cancel first trimester screen/ they canceled appointment and I Dc'd orders.

## 2011-03-09 ENCOUNTER — Ambulatory Visit (HOSPITAL_COMMUNITY)
Admission: RE | Admit: 2011-03-09 | Discharge: 2011-03-09 | Disposition: A | Discharge status: Home / Self Care | Payer: Medicaid Other | Source: Ambulatory Visit | Attending: Family Medicine | Admitting: Family Medicine

## 2011-03-09 ENCOUNTER — Other Ambulatory Visit: Payer: Self-pay | Admitting: Family Medicine

## 2011-03-09 ENCOUNTER — Ambulatory Visit (HOSPITAL_COMMUNITY): Payer: Medicaid Other | Admitting: Anesthesiology

## 2011-03-09 ENCOUNTER — Encounter (HOSPITAL_COMMUNITY): Payer: Self-pay | Admitting: *Deleted

## 2011-03-09 ENCOUNTER — Encounter (HOSPITAL_COMMUNITY): Payer: Self-pay | Admitting: Anesthesiology

## 2011-03-09 ENCOUNTER — Encounter (HOSPITAL_COMMUNITY)
Admission: RE | Disposition: A | Discharge status: Home / Self Care | Payer: Self-pay | Source: Ambulatory Visit | Attending: Family Medicine

## 2011-03-09 DIAGNOSIS — Z9889 Other specified postprocedural states: Secondary | ICD-10-CM

## 2011-03-09 DIAGNOSIS — O021 Missed abortion: Secondary | ICD-10-CM

## 2011-03-09 HISTORY — PX: DILATION AND EVACUATION: SHX1459

## 2011-03-09 LAB — CBC
Hemoglobin: 12.5 g/dL (ref 12.0–15.0)
MCH: 31.6 pg (ref 26.0–34.0)
MCV: 89.4 fL (ref 78.0–100.0)
RBC: 3.96 MIL/uL (ref 3.87–5.11)

## 2011-03-09 SURGERY — DILATION AND EVACUATION, UTERUS
Anesthesia: Monitor Anesthesia Care | Site: Vagina | Wound class: Clean Contaminated

## 2011-03-09 MED ORDER — IBUPROFEN 800 MG PO TABS: 800.0000 mg | ORAL_TABLET | Freq: Three times a day (TID) | ORAL | Status: AC

## 2011-03-09 MED ORDER — KETOROLAC TROMETHAMINE 60 MG/2ML IM SOLN
INTRAMUSCULAR | Status: AC
  Filled 2011-03-09: qty 2

## 2011-03-09 MED ORDER — FENTANYL CITRATE 0.05 MG/ML IJ SOLN
INTRAMUSCULAR | Status: DC | PRN
  Administered 2011-03-09 (×5): 50 ug via INTRAVENOUS

## 2011-03-09 MED ORDER — KETOROLAC TROMETHAMINE 30 MG/ML IJ SOLN
INTRAMUSCULAR | Status: DC | PRN
  Administered 2011-03-09: 60 mg via INTRAVENOUS

## 2011-03-09 MED ORDER — FENTANYL CITRATE 0.05 MG/ML IJ SOLN
INTRAMUSCULAR | Status: AC
  Filled 2011-03-09: qty 5

## 2011-03-09 MED ORDER — PROPOFOL 10 MG/ML IV EMUL
INTRAVENOUS | Status: DC | PRN
  Administered 2011-03-09: 120 ug/kg/min via INTRAVENOUS

## 2011-03-09 MED ORDER — PROPOFOL 10 MG/ML IV EMUL
INTRAVENOUS | Status: DC | PRN
  Administered 2011-03-09: 30 mg via INTRAVENOUS
  Administered 2011-03-09: 40 mg via INTRAVENOUS
  Administered 2011-03-09: 10 mg via INTRAVENOUS
  Administered 2011-03-09: 20 mg via INTRAVENOUS

## 2011-03-09 MED ORDER — MIDAZOLAM HCL 5 MG/5ML IJ SOLN
INTRAMUSCULAR | Status: DC | PRN
  Administered 2011-03-09: 2 mg via INTRAVENOUS

## 2011-03-09 MED ORDER — LIDOCAINE HCL (CARDIAC) 20 MG/ML IV SOLN
INTRAVENOUS | Status: AC
  Filled 2011-03-09: qty 5

## 2011-03-09 MED ORDER — MIDAZOLAM HCL 2 MG/2ML IJ SOLN
INTRAMUSCULAR | Status: AC
  Filled 2011-03-09: qty 2

## 2011-03-09 MED ORDER — BUPIVACAINE-EPINEPHRINE 0.25% -1:200000 IJ SOLN
INTRAMUSCULAR | Status: DC | PRN
  Administered 2011-03-09: 8 mL

## 2011-03-09 MED ORDER — GLYCOPYRROLATE 0.2 MG/ML IJ SOLN
INTRAMUSCULAR | Status: AC
  Filled 2011-03-09: qty 1

## 2011-03-09 MED ORDER — DOXYCYCLINE HYCLATE 50 MG PO CAPS: 100.0000 mg | ORAL_CAPSULE | Freq: Two times a day (BID) | ORAL | Status: AC

## 2011-03-09 MED ORDER — ONDANSETRON HCL 4 MG/2ML IJ SOLN
INTRAMUSCULAR | Status: AC
  Filled 2011-03-09: qty 2

## 2011-03-09 MED ORDER — LACTATED RINGERS IV SOLN
INTRAVENOUS | Status: DC
  Administered 2011-03-09 (×4): via INTRAVENOUS

## 2011-03-09 MED ORDER — PROPOFOL 10 MG/ML IV EMUL
INTRAVENOUS | Status: AC
  Filled 2011-03-09: qty 20

## 2011-03-09 MED ORDER — LIDOCAINE HCL (CARDIAC) 20 MG/ML IV SOLN
INTRAVENOUS | Status: DC | PRN
  Administered 2011-03-09 (×2): 20 mg via INTRAVENOUS

## 2011-03-09 MED ORDER — ONDANSETRON HCL 4 MG/2ML IJ SOLN
INTRAMUSCULAR | Status: DC | PRN
  Administered 2011-03-09: 4 mg via INTRAVENOUS

## 2011-03-09 MED ORDER — DEXAMETHASONE SODIUM PHOSPHATE 4 MG/ML IJ SOLN
INTRAMUSCULAR | Status: DC | PRN
  Administered 2011-03-09: 4 mg via INTRAVENOUS

## 2011-03-09 MED ORDER — LORAZEPAM 0.5 MG PO TABS: 0.5000 mg | ORAL_TABLET | Freq: Three times a day (TID) | ORAL | Status: AC

## 2011-03-09 MED ORDER — DEXAMETHASONE SODIUM PHOSPHATE 10 MG/ML IJ SOLN
INTRAMUSCULAR | Status: AC
  Filled 2011-03-09: qty 1

## 2011-03-09 SURGICAL SUPPLY — 21 items
CATH ROBINSON RED A/P 16FR (CATHETERS) ×2 IMPLANT
CLOTH BEACON ORANGE TIMEOUT ST (SAFETY) ×2 IMPLANT
DECANTER SPIKE VIAL GLASS SM (MISCELLANEOUS) ×2 IMPLANT
DRAPE UTILITY XL STRL (DRAPES) ×2 IMPLANT
GLOVE BIOGEL PI IND STRL 7.0 (GLOVE) ×1 IMPLANT
GLOVE BIOGEL PI INDICATOR 7.0 (GLOVE) ×1
GLOVE ECLIPSE 7.0 STRL STRAW (GLOVE) ×4 IMPLANT
GOWN PREVENTION PLUS LG XLONG (DISPOSABLE) ×2 IMPLANT
GOWN PREVENTION PLUS XLARGE (GOWN DISPOSABLE) ×2 IMPLANT
KIT BERKELEY 1ST TRIMESTER 3/8 (MISCELLANEOUS) ×2 IMPLANT
NEEDLE SPNL 22GX3.5 QUINCKE BK (NEEDLE) ×2 IMPLANT
NS IRRIG 1000ML POUR BTL (IV SOLUTION) ×2 IMPLANT
PACK VAGINAL MINOR WOMEN LF (CUSTOM PROCEDURE TRAY) ×2 IMPLANT
PAD PREP 24X48 CUFFED NSTRL (MISCELLANEOUS) ×2 IMPLANT
SET BERKELEY SUCTION TUBING (SUCTIONS) ×2 IMPLANT
SYR CONTROL 10ML LL (SYRINGE) ×2 IMPLANT
TOWEL OR 17X24 6PK STRL BLUE (TOWEL DISPOSABLE) ×4 IMPLANT
VACURETTE 10 RIGID CVD (CANNULA) ×2 IMPLANT
VACURETTE 7MM CVD STRL WRAP (CANNULA) IMPLANT
VACURETTE 8 RIGID CVD (CANNULA) IMPLANT
VACURETTE 9 RIGID CVD (CANNULA) IMPLANT

## 2011-03-09 NOTE — Transfer of Care (Signed)
Immediate Anesthesia Transfer of Care Note  Patient: [name]  Procedure(s) Performed:  DILATATION AND EVACUATION (D&E)  Patient Location: PACU  Anesthesia Type: MAC  Level of Consciousness: awake, alert , oriented and patient cooperative  Airway & Oxygen Therapy: Patient Spontanous Breathing and Patient connected to nasal cannula oxygen  Post-op Assessment: Report given to PACU RN, Post -op Vital signs reviewed and stable and Patient moving all extremities X 4  Post vital signs: Reviewed and stable  Complications: No apparent anesthesia complications

## 2011-03-09 NOTE — H&P (Addendum)
[name] is an 30 y.o. female. She has a missed Ab at 10 wks.  Pertinent Gynecological History: Contraception: none DES exposure: denies Blood transfusions: none Sexually transmitted diseases: no past history Previous GYN Procedures: negative  OB History: G5, P2022   Menstrual History:  Patient's last menstrual period was 12/02/2010.    Past Medical History  Diagnosis Date  . Hypertension   . Gestational diabetes   . Migraine     Past Surgical History  Procedure Date  . Tonsillectomy 1988  . Tubes in ears Baby    Family History  Problem Relation Age of Onset  . Diabetes Mother   . Osteoarthritis Mother   . Hypertension Mother   . Hypothyroidism Mother   . Heart disease Mother   . Hyperlipidemia Mother   . Migraines Mother   . Mental illness Mother     Depression  . Hypertension Father   . Heart failure Father   . Hepatitis Father   . Cirrhosis Father   . Hyperlipidemia Father   . Migraines Father   . Mental illness Father     Depression  . Breast cancer Maternal Aunt 70  . Cancer Sister 37    Cervical  . Heart disease Sister     Heart Stops  . Migraines Sister   . Seizures Sister 11    unsure if this or heart problem  . Mental illness Sister     depression  . Heart disease Maternal Grandmother     Social History:  reports that she has been smoking Cigarettes.  She has a 6 pack-year smoking history. She has never used smokeless tobacco. She reports that she does not drink alcohol or use illicit drugs.  Allergies: No Known Allergies  Prescriptions prior to admission  Medication Sig Dispense Refill  . atenolol (TENORMIN) 50 MG tablet Take 50 mg by mouth daily.        Burnis Medin w/o A Vit-FeFum-FePo-FA (CONCEPT OB) 130-92.4-1 MG CAPS Take 1 capsule by mouth daily.  30 capsule  11    Review of Systems  Constitutional: Negative for fever, chills and weight loss.  HENT: Negative for hearing loss.   Eyes: Negative for blurred vision.    Respiratory: Negative for cough, hemoptysis, shortness of breath and wheezing.   Cardiovascular: Negative for chest pain and leg swelling.  Gastrointestinal: Negative for heartburn, nausea, vomiting, abdominal pain, diarrhea and constipation.  Genitourinary: Negative for dysuria.  Musculoskeletal: Negative for myalgias and joint pain.  Neurological: Negative for dizziness, seizures and headaches.  Psychiatric/Behavioral: Negative for depression.    Blood pressure 118/83, pulse 78, temperature 98.4 F (36.9 C), temperature source Oral, resp. rate 16, height 5\' 4"  (1.626 m), weight 81.194 kg (179 lb), last menstrual period 12/02/2010, SpO2 98.00%. Physical Exam  Vitals reviewed. Constitutional: She is oriented to person, place, and time. She appears well-developed and well-nourished.  HENT:  Head: Normocephalic and atraumatic.  Eyes: Pupils are equal, round, and reactive to light.  Neck: Normal range of motion.  Cardiovascular: Normal rate and regular rhythm.   Respiratory: Effort normal and breath sounds normal.  GI: Soft. Bowel sounds are normal.  Musculoskeletal: Normal range of motion.  Neurological: She is alert and oriented to person, place, and time.  Skin: Skin is warm and dry.  Psychiatric: She has a normal mood and affect.    No results found for this or any previous visit (from the past 24 hour(s)).  US Ob Comp Less 14 Wks  03/08/2011  OBSTETRICAL ULTRASOUND: This exam was performed within a [facility]. The OB US report was generated in the [facility] system, and faxed to the ordering physician.   This report is also available in [employer] and in the [employer]. See [facility] Obstetric US report.    Assessment/Plan: Missed AB--Suction D & C  Terrall Bley S 03/09/2011, 9:02 AM

## 2011-03-09 NOTE — Preoperative (Addendum)
Beta Blockers   Reason not to administer Beta Blockers:Not Applicable, Hold beta blocker due to other,patient took medication at home this am

## 2011-03-09 NOTE — Anesthesia Preprocedure Evaluation (Addendum)
Anesthesia Evaluation  General Assessment Comment  Airway       Dental   Pulmonary      Cardiovascular hypertension, Pt. on home beta blockers and Pt. on medications     Neuro/Psych   GI/Hepatic/Renal   Endo/Other  (+) Diabetes mellitus-, Gestational,     Abdominal   Musculoskeletal   Hematology   Peds  Reproductive/Obstetrics    Anesthesia Other Findings             Anesthesia Physical Anesthesia Plan  ASA: II  Anesthesia Plan: MAC   Post-op Pain Management:    Induction: Intravenous  Airway Management Planned: Mask  Additional Equipment:   Intra-op Plan:   Post-operative Plan:   Informed Consent: I have reviewed the patients History and Physical, chart, labs and discussed the procedure including the risks, benefits and alternatives for the proposed anesthesia with the patient or authorized representative who has indicated his/her understanding and acceptance.   Dental Advisory Given  Plan Discussed with: CRNA and Surgeon  Anesthesia Plan Comments: (  Discussed anesthesia, including possible nausea, instrumentation of airway, sore throat,pulmonary aspiration, etc. I asked if the were any outstanding questions, or  concerns before we proceeded. )        Anesthesia Quick Evaluation

## 2011-03-09 NOTE — Anesthesia Postprocedure Evaluation (Signed)
  Anesthesia Post-op Note  Patient: [name]  Procedure(s) Performed:  DILATATION AND EVACUATION (D&E)  Patient is awake and responsive. Pain and nausea are reasonably well controlled. Vital signs are stable and clinically acceptable. Oxygen saturation is clinically acceptable. There are no apparent anesthetic complications at this time. Patient is ready for discharge.

## 2011-03-09 NOTE — Op Note (Signed)
[name] PROCEDURE DATE: 03/09/2011  PREOPERATIVE DIAGNOSIS: 13  week missed abortion. POSTOPERATIVE DIAGNOSIS: The same. PROCEDURE:     Dilation and Evacuation. SURGEON:  Dr. Shawnie Pons  INDICATIONS: 30 y.o. G5P2022with MAB at [redacted] weeks gestation, needing surgical completion.  Risks of surgery were discussed with the patient including but not limited to: bleeding which may require transfusion; infection which may require antibiotics; injury to uterus or surrounding organs;need for additional procedures including laparotomy or laparoscopy; possibility of intrauterine scarring which may impair future fertility; and other postoperative/anesthesia complications. Written informed consent was obtained.    FINDINGS:  A 12 week size anteverted uterus, moderate amounts of products of conception, specimen sent to pathology.  ANESTHESIA:    Monitored intravenous sedation, paracervical block. ESTIMATED BLOOD LOSS:  Less than 20 ml. SPECIMENS:  Products of conception sent to pathology COMPLICATIONS:  None immediate.  PROCEDURE DETAILS:   The patient was taken to the operating room where general anesthesia was administered and was found to be adequate.  After an adequate timeout was performed, she was placed in the dorsal lithotomy position and examined; then prepped and draped in the sterile manner.   Her bladder was catheterized for an unmeasured amount of clear, yellow urine. A vaginal speculum was then placed in the patient's vagina and a single tooth tenaculum was applied to the anterior lip of the cervix.  A paracervical block using 1% Marcaine was administered. The uterus sounded to 11cm.  The cervix was gently dilated to accommodate a 10 mm suction curette that was gently advanced to the uterine fundus.  The suction device was then activated and curette slowly rotated to clear the uterus of products of conception.  A sharp curettage was then performed to confirm complete emptying of the uterus.There was  minimal bleeding noted and the tenaculum removed with good hemostasis noted.  The patient tolerated the procedure well.  The patient was taken to the recovery area in stable condition.

## 2011-03-13 ENCOUNTER — Encounter: Payer: Self-pay | Admitting: Family Medicine

## 2011-03-15 ENCOUNTER — Telehealth: Payer: Self-pay | Admitting: *Deleted

## 2011-03-15 MED ORDER — ZOLPIDEM TARTRATE 10 MG PO TABS: 10.0000 mg | ORAL_TABLET | Freq: Every evening | ORAL | Status: DC | PRN

## 2011-03-15 MED ORDER — BUTALBITAL-APAP-CAFFEINE 50-325-40 MG PO TABS: 1.0000 | ORAL_TABLET | Freq: Four times a day (QID) | ORAL | Status: AC | PRN

## 2011-03-15 MED ORDER — CLONAZEPAM 0.5 MG PO TABS: ORAL_TABLET | ORAL | Status: DC

## 2011-03-15 NOTE — Telephone Encounter (Signed)
Patient is having a headache since Tuesday.  Advil and ice packs are not helping.  She would like something called in to help.  The pain is making her nauseated.

## 2011-03-15 NOTE — Telephone Encounter (Signed)
Patient would like to have a prescription refill for her Ambien 10mg .  She would also like to have a prescription for Klonipin, a friend of hers let her try one and it helped her headache tremendously.

## 2011-03-16 ENCOUNTER — Other Ambulatory Visit: Payer: Self-pay | Admitting: *Deleted

## 2011-03-16 NOTE — Telephone Encounter (Signed)
These meds were documents and filled in the wrong chart.  They were meant for another patient and will be transferred to her chart. Remus Loffler and Klonipin)

## 2011-03-22 ENCOUNTER — Other Ambulatory Visit (HOSPITAL_COMMUNITY): Payer: Self-pay

## 2011-03-27 ENCOUNTER — Ambulatory Visit (INDEPENDENT_AMBULATORY_CARE_PROVIDER_SITE_OTHER): Payer: Medicaid Other | Admitting: Family Medicine

## 2011-03-27 ENCOUNTER — Encounter: Payer: Self-pay | Admitting: Family Medicine

## 2011-03-27 VITALS — BP 133/105 | HR 80 | Ht 60.0 in | Wt 178.0 lb

## 2011-03-27 DIAGNOSIS — Z09 Encounter for follow-up examination after completed treatment for conditions other than malignant neoplasm: Secondary | ICD-10-CM

## 2011-03-27 NOTE — Progress Notes (Signed)
  Subjective:    Patient ID: [name], female    DOB: [birth year], 30 y.o.   MRN: [id number]  HPI Here following D and E for missed AB. Doing better emotionally.   Review of Systems  Constitutional: Negative for appetite change.  Genitourinary: Positive for vaginal bleeding.  Psychiatric/Behavioral: Positive for dysphoric mood.       Objective:   Physical Exam  Vitals reviewed. Constitutional: She appears well-developed and well-nourished.  Cardiovascular: Normal rate.   Pulmonary/Chest: Effort normal.  Abdominal: Soft.          Assessment & Plan:   S/p D and E for missed AB No pregnancy x 3 cycles.

## 2011-03-27 NOTE — Progress Notes (Signed)
Patient is here for follow up d and e.  She is still having some irregular bleeding.

## 2011-03-28 ENCOUNTER — Encounter (HOSPITAL_COMMUNITY): Payer: Self-pay | Admitting: Family Medicine

## 2011-03-29 ENCOUNTER — Emergency Department: Payer: Self-pay | Admitting: Emergency Medicine

## 2011-04-02 DEATH — deceased

## 2011-04-04 ENCOUNTER — Other Ambulatory Visit (INDEPENDENT_AMBULATORY_CARE_PROVIDER_SITE_OTHER): Payer: Medicaid Other

## 2011-04-04 DIAGNOSIS — N912 Amenorrhea, unspecified: Secondary | ICD-10-CM

## 2011-04-04 NOTE — Progress Notes (Signed)
Patient comes today after having a positive pregnancy test at home.  It has been four weeks since her d and c.  She suddenly started to feel nauseated and unable to brush her teeth.  So she took a test.  I drew her blood today and explained that although her levels should be down, we needed to check quants before we could be sure if this was an actual pregnancy or residual hormone from her previous miscarriage.  She understand and I will call her tomorrow with her results.

## 2011-04-05 ENCOUNTER — Other Ambulatory Visit (INDEPENDENT_AMBULATORY_CARE_PROVIDER_SITE_OTHER): Payer: Medicaid Other | Admitting: *Deleted

## 2011-04-05 DIAGNOSIS — N912 Amenorrhea, unspecified: Secondary | ICD-10-CM

## 2011-04-05 NOTE — Progress Notes (Signed)
Patient has had two positive home pregnancy tests.  She brought the tests in and they were clearly positive.  They were from the same batch of tests that she purchased at [facility] in [place].  She had a negative blood test yesterday but is confused about the positive urine tests at home.  Today we repeated the urine test with one from our office and it was negative as well.  I reassured her that her test looked negative as well as the negative serum HCG.  She understands and will hold off on getting pregnant for a couple of months due to her recent d and c.

## 2011-04-10 ENCOUNTER — Telehealth: Payer: Self-pay | Admitting: *Deleted

## 2011-04-10 DIAGNOSIS — B3731 Acute candidiasis of vulva and vagina: Secondary | ICD-10-CM

## 2011-04-10 DIAGNOSIS — B373 Candidiasis of vulva and vagina: Secondary | ICD-10-CM

## 2011-04-10 MED ORDER — FLUCONAZOLE 150 MG PO TABS: 150.0000 mg | ORAL_TABLET | Freq: Once | ORAL | Status: AC

## 2011-04-10 NOTE — Telephone Encounter (Signed)
Patient is having a yeast infection and would like Diflucan called into her pharmacy.

## 2011-04-11 ENCOUNTER — Other Ambulatory Visit (INDEPENDENT_AMBULATORY_CARE_PROVIDER_SITE_OTHER): Payer: Medicaid Other | Admitting: Gynecology

## 2011-04-11 DIAGNOSIS — O021 Missed abortion: Secondary | ICD-10-CM

## 2011-04-12 LAB — HCG, QUANTITATIVE, PREGNANCY: hCG, Beta Chain, Quant, S: <2 m[IU]/mL

## 2011-05-16 ENCOUNTER — Other Ambulatory Visit (INDEPENDENT_AMBULATORY_CARE_PROVIDER_SITE_OTHER): Payer: Medicaid Other | Admitting: *Deleted

## 2011-05-16 DIAGNOSIS — N912 Amenorrhea, unspecified: Secondary | ICD-10-CM

## 2011-05-16 NOTE — Progress Notes (Signed)
Patient had ambiguous pregnancy test and would like confirmation.  The line was positive but very light.

## 2011-05-17 LAB — HCG, QUANTITATIVE, PREGNANCY: hCG, Beta Chain, Quant, S: <2 m[IU]/mL

## 2011-05-23 ENCOUNTER — Emergency Department: Payer: Self-pay | Admitting: Emergency Medicine

## 2011-06-13 ENCOUNTER — Emergency Department: Payer: Self-pay | Admitting: Unknown Physician Specialty

## 2011-06-13 IMAGING — CR DG CHEST 2V
1 series · 2 of 2 positions shown · non-contrast
Comparison: none

REASON FOR EXAM: cough,
COMMENTS:   LMP: Three weeks ago

PROCEDURE:     DXR - DXR CHEST PA (OR AP) AND LATERAL  - [DATE] [DATE]
RESULT:     The lungs are clear. The cardiac silhouette and visualized bony
skeleton are unremarkable.

[Series 1: w chest pa · 0.14mm/px · 2 of 2 slices shown]
[im 1/2]
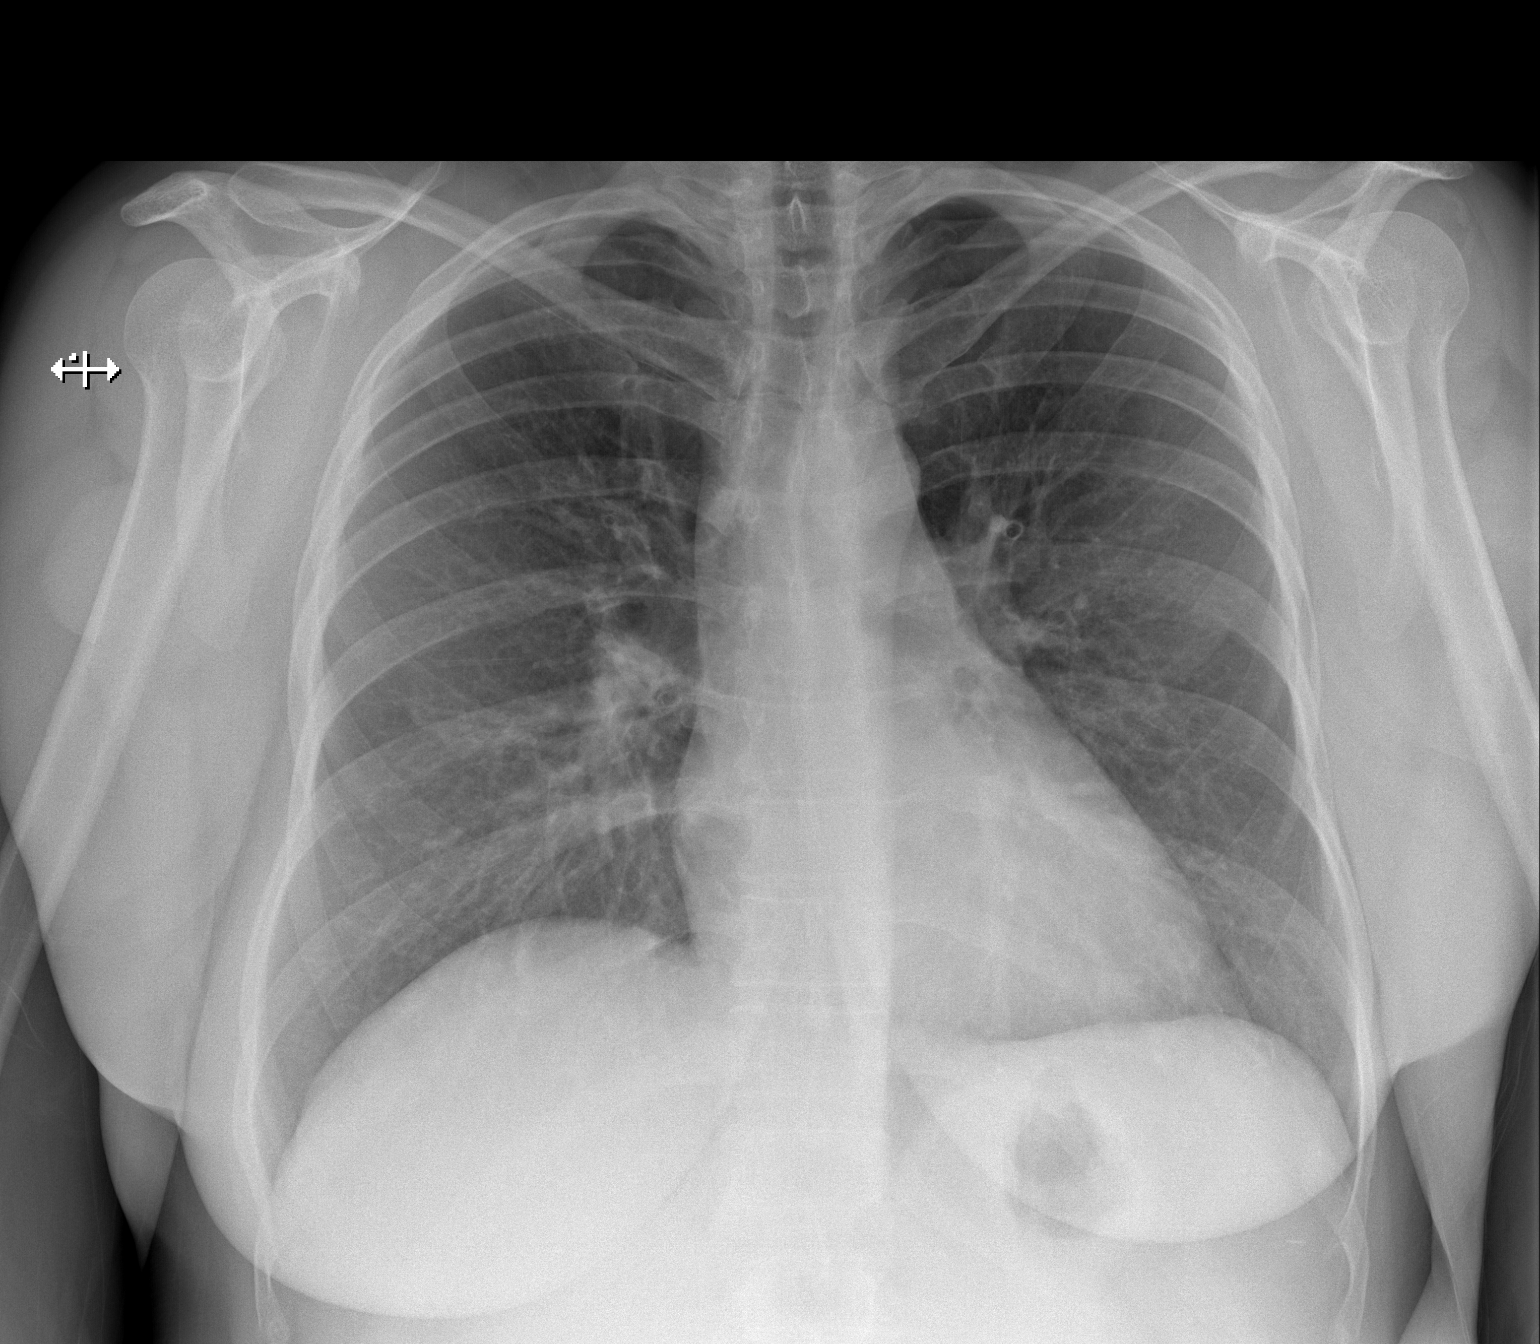
[im 2/2]
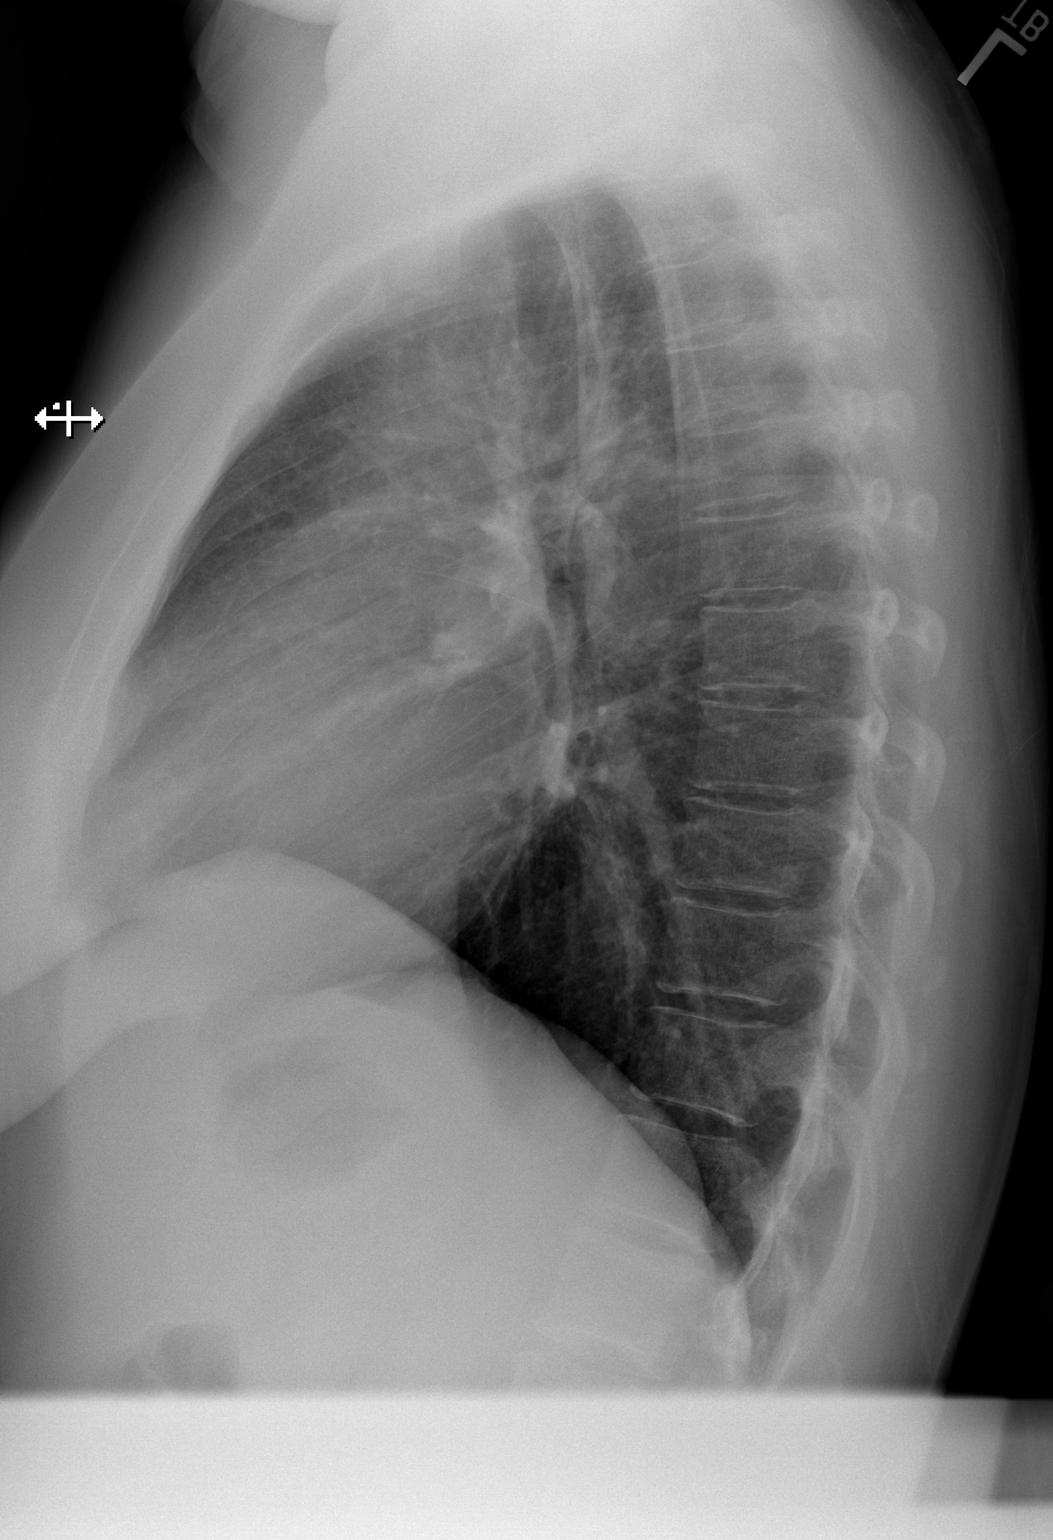

[2 of 2 positions shown; findings below may reference images not displayed]

IMPRESSION: 1. Chest radiograph without evidence of acute cardiopulmonary disease.
2. A comparison is made to prior study dated [DATE].

## 2011-07-11 ENCOUNTER — Other Ambulatory Visit (INDEPENDENT_AMBULATORY_CARE_PROVIDER_SITE_OTHER): Payer: Medicaid Other | Admitting: Gynecology

## 2011-07-11 DIAGNOSIS — N912 Amenorrhea, unspecified: Secondary | ICD-10-CM

## 2011-07-11 DIAGNOSIS — Z7251 High risk heterosexual behavior: Secondary | ICD-10-CM

## 2011-07-12 LAB — HCG, QUANTITATIVE, PREGNANCY: hCG, Beta Chain, Quant, S: <2 m[IU]/mL

## 2011-07-16 ENCOUNTER — Encounter: Payer: Self-pay | Admitting: Family Medicine

## 2011-07-16 ENCOUNTER — Ambulatory Visit (INDEPENDENT_AMBULATORY_CARE_PROVIDER_SITE_OTHER): Payer: Medicaid Other | Admitting: Family Medicine

## 2011-07-16 VITALS — BP 136/97 | HR 77 | Ht 64.0 in | Wt 184.0 lb

## 2011-07-16 DIAGNOSIS — Z01812 Encounter for preprocedural laboratory examination: Secondary | ICD-10-CM

## 2011-07-16 DIAGNOSIS — Z30430 Encounter for insertion of intrauterine contraceptive device: Secondary | ICD-10-CM

## 2011-07-16 DIAGNOSIS — Z3202 Encounter for pregnancy test, result negative: Secondary | ICD-10-CM

## 2011-07-16 LAB — POCT URINE PREGNANCY: Preg Test, Ur: NEGATIVE

## 2011-07-16 NOTE — Patient Instructions (Signed)

## 2011-07-16 NOTE — Progress Notes (Signed)
Patient is here for Mirena IUD.  Pregnancy test negative.

## 2011-07-16 NOTE — Progress Notes (Signed)
Patient identified, informed consent performed, signed copy in chart, time out was performed.  Urine pregnancy test negative.  Speculum placed in the vagina.  Cervix visualized.  Cleaned with Betadine x 2.  Grasped anteriourly with a single tooth tenaculum.  Uterus sounded to 8cm.  Mirena IUD placed per manufacturer's recommendations.  Strings trimmed to 3 cm.   Patient given post procedure instructions and Mirena care card with expiration date.  Patient is asked to check IUD strings periodically and follow up in 4-6 weeks for IUD check. 

## 2011-08-14 ENCOUNTER — Ambulatory Visit: Payer: Medicaid Other | Admitting: Family Medicine

## 2011-10-11 ENCOUNTER — Other Ambulatory Visit: Payer: Medicaid Other

## 2011-12-28 ENCOUNTER — Other Ambulatory Visit: Payer: Self-pay | Admitting: *Deleted

## 2011-12-28 MED ORDER — ATENOLOL 50 MG PO TABS: 50.0000 mg | ORAL_TABLET | Freq: Every day | ORAL | Status: DC

## 2011-12-28 NOTE — Telephone Encounter (Signed)
Patient needs refill of blood pressure medication. 

## 2012-01-15 ENCOUNTER — Other Ambulatory Visit (INDEPENDENT_AMBULATORY_CARE_PROVIDER_SITE_OTHER): Payer: Self-pay | Admitting: Gynecology

## 2012-01-15 DIAGNOSIS — N912 Amenorrhea, unspecified: Secondary | ICD-10-CM

## 2012-01-15 LAB — POCT URINE PREGNANCY: Preg Test, Ur: NEGATIVE

## 2012-01-16 LAB — HCG, QUANTITATIVE, PREGNANCY: hCG, Beta Chain, Quant, S: <2 m[IU]/mL

## 2012-04-21 ENCOUNTER — Ambulatory Visit: Payer: Self-pay | Admitting: Obstetrics & Gynecology

## 2012-05-16 ENCOUNTER — Emergency Department: Payer: Self-pay | Admitting: Emergency Medicine

## 2012-05-16 LAB — APTT: Activated PTT: 33.2 secs (ref 23.6–35.9)

## 2012-05-16 LAB — COMPREHENSIVE METABOLIC PANEL
Alkaline Phosphatase: 85 U/L (ref 50–136)
Calcium, Total: 9.2 mg/dL (ref 8.5–10.1)
Chloride: 106 mmol/L (ref 98–107)
Co2: 24 mmol/L (ref 21–32)
Creatinine: 0.78 mg/dL (ref 0.60–1.30)
EGFR (African American): >60
EGFR (Non-African Amer.): >60
Potassium: 3 mmol/L — ABNORMAL LOW (ref 3.5–5.1)
Sodium: 139 mmol/L (ref 136–145)
Total Protein: 7.6 g/dL (ref 6.4–8.2)

## 2012-05-16 LAB — CBC
MCH: 32.7 pg (ref 26.0–34.0)
Platelet: 211 10*3/uL (ref 150–440)
RBC: 4.18 10*6/uL (ref 3.80–5.20)
WBC: 8.7 10*3/uL (ref 3.6–11.0)

## 2012-05-16 LAB — CK TOTAL AND CKMB (NOT AT ARMC)
CK, Total: 116 U/L (ref 21–215)
CK-MB: 0.9 ng/mL (ref 0.5–3.6)

## 2012-05-16 LAB — TROPONIN I: Troponin-I: <0.02 ng/mL

## 2012-07-02 NOTE — L&D Delivery Note (Signed)
Delivery Note At 11:59 AM a viable female was delivered via Vaginal, Spontaneous Delivery (Presentation: Middle Occiput Anterior).  APGAR: crying at perineum ; weight .   Placenta status: Intact, Spontaneous.  Cord:  with the following complications: None.   Anesthesia: Epidural  Episiotomy: none Lacerations: none Suture Repair: na Est. Blood Loss (mL): 250  Mom to postpartum.  Baby to nursery-stable.  NSVD over intact perineum. Viable infant. Active managmeent of 3rd stage pit and traction. Delivered intact placenta and 3v cord. Hemostatic after delivery of placenta. EBL 250cc.    [name] 03/01/2013, 12:07 PM

## 2012-07-08 ENCOUNTER — Encounter: Payer: Self-pay | Admitting: Family Medicine

## 2012-07-08 ENCOUNTER — Ambulatory Visit (INDEPENDENT_AMBULATORY_CARE_PROVIDER_SITE_OTHER): Payer: Medicaid Other | Admitting: Family Medicine

## 2012-07-08 ENCOUNTER — Other Ambulatory Visit: Payer: Self-pay | Admitting: Family Medicine

## 2012-07-08 ENCOUNTER — Ambulatory Visit (HOSPITAL_COMMUNITY)
Admission: RE | Admit: 2012-07-08 | Discharge: 2012-07-08 | Disposition: A | Discharge status: Home / Self Care | Payer: Medicaid Other | Source: Ambulatory Visit | Attending: Family Medicine | Admitting: Family Medicine

## 2012-07-08 ENCOUNTER — Telehealth: Payer: Self-pay | Admitting: *Deleted

## 2012-07-08 VITALS — BP 145/87 | Wt 186.0 lb

## 2012-07-08 DIAGNOSIS — O2630 Retained intrauterine contraceptive device in pregnancy, unspecified trimester: Secondary | ICD-10-CM

## 2012-07-08 DIAGNOSIS — O10019 Pre-existing essential hypertension complicating pregnancy, unspecified trimester: Secondary | ICD-10-CM

## 2012-07-08 DIAGNOSIS — Z1151 Encounter for screening for human papillomavirus (HPV): Secondary | ICD-10-CM

## 2012-07-08 DIAGNOSIS — Z3480 Encounter for supervision of other normal pregnancy, unspecified trimester: Secondary | ICD-10-CM

## 2012-07-08 DIAGNOSIS — O09299 Supervision of pregnancy with other poor reproductive or obstetric history, unspecified trimester: Secondary | ICD-10-CM | POA: Insufficient documentation

## 2012-07-08 DIAGNOSIS — O0990 Supervision of high risk pregnancy, unspecified, unspecified trimester: Secondary | ICD-10-CM | POA: Insufficient documentation

## 2012-07-08 DIAGNOSIS — IMO0002 Reserved for concepts with insufficient information to code with codable children: Secondary | ICD-10-CM

## 2012-07-08 DIAGNOSIS — Z124 Encounter for screening for malignant neoplasm of cervix: Secondary | ICD-10-CM

## 2012-07-08 DIAGNOSIS — O99330 Smoking (tobacco) complicating pregnancy, unspecified trimester: Secondary | ICD-10-CM | POA: Insufficient documentation

## 2012-07-08 DIAGNOSIS — Z113 Encounter for screening for infections with a predominantly sexual mode of transmission: Secondary | ICD-10-CM

## 2012-07-08 IMAGING — US US OB TRANSVAGINAL
1 series · 13 of 28 positions shown · non-contrast
Comparison: none

[Series 1: us ob transvaginal · 13 of 105 slices shown]
[im 4/105]
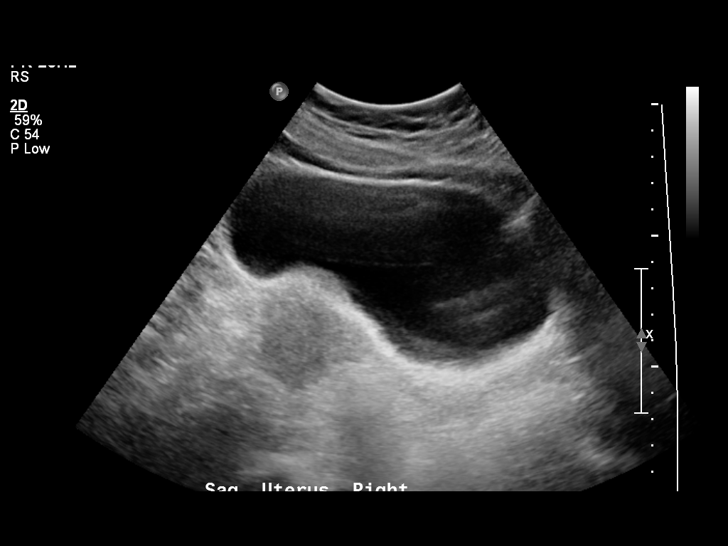
[im 12/105]
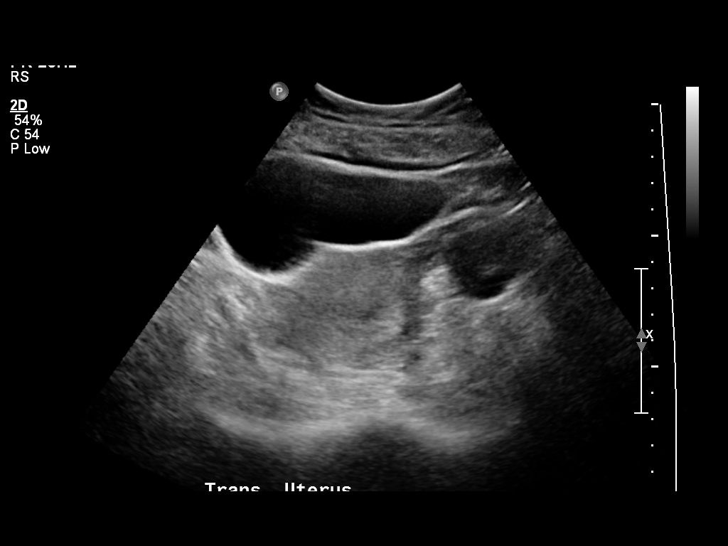
[im 20/105]
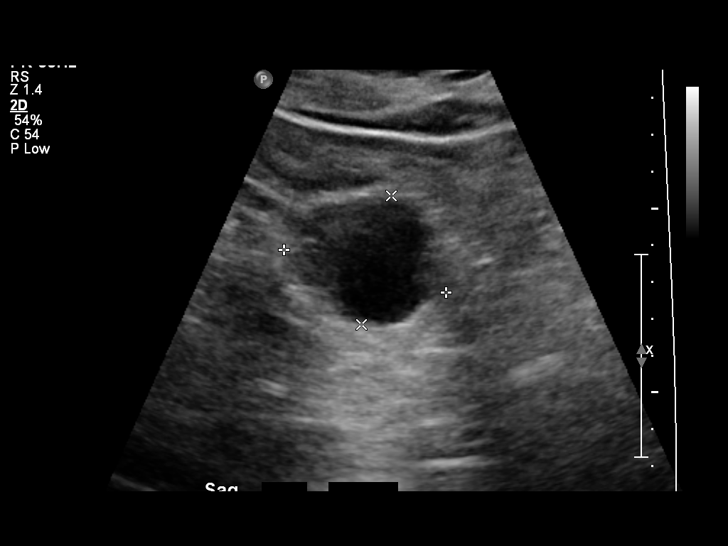
[im 27/105]
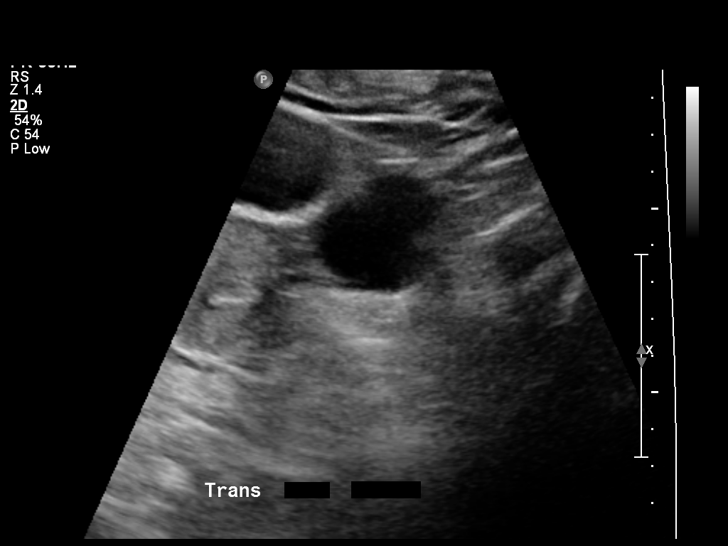
[im 35/105]
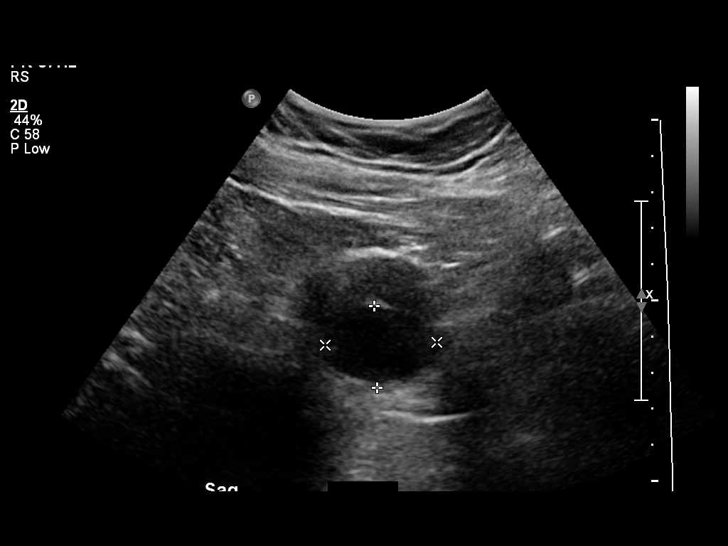
[im 43/105]
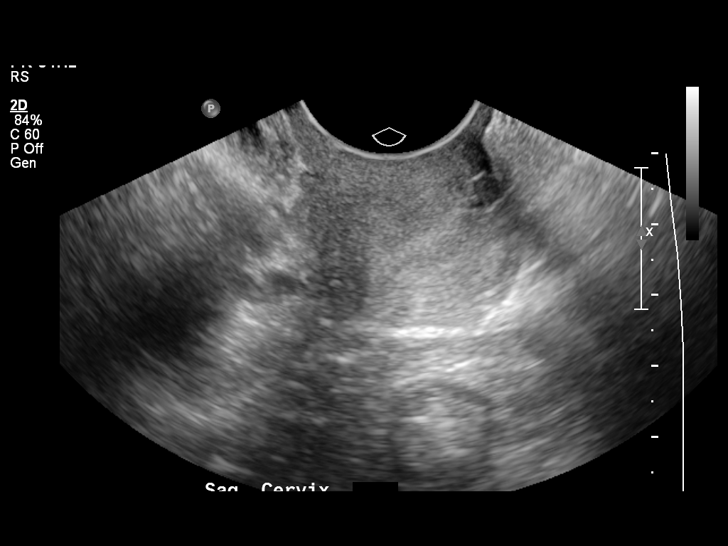
[im 54/105]
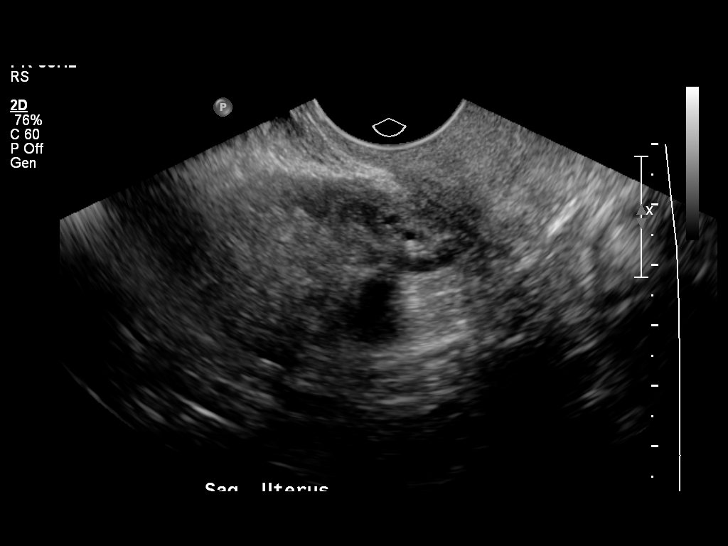
[im 62/105]
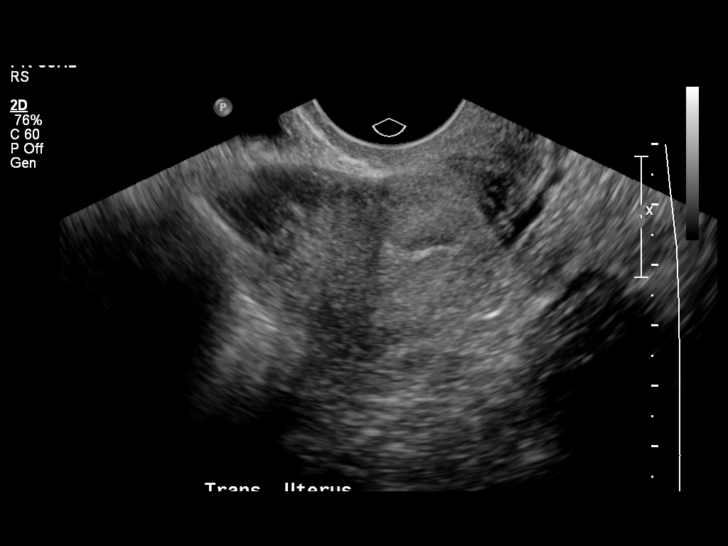
[im 70/105]
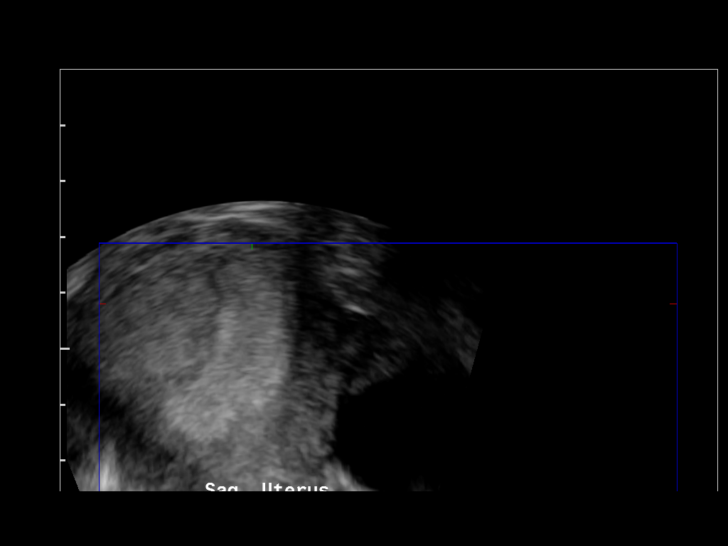
[im 78/105]
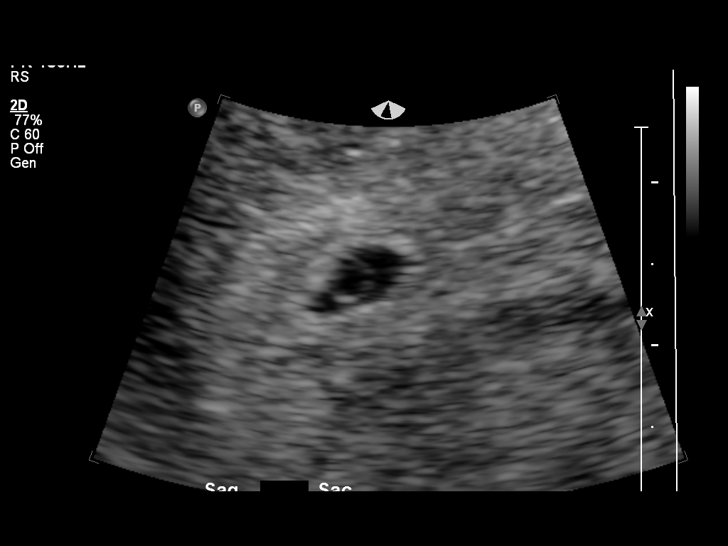
[im 85/105]
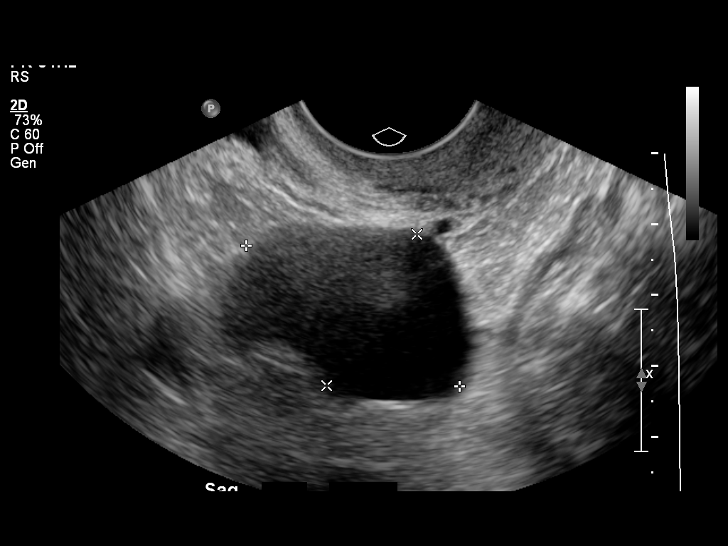
[im 93/105]
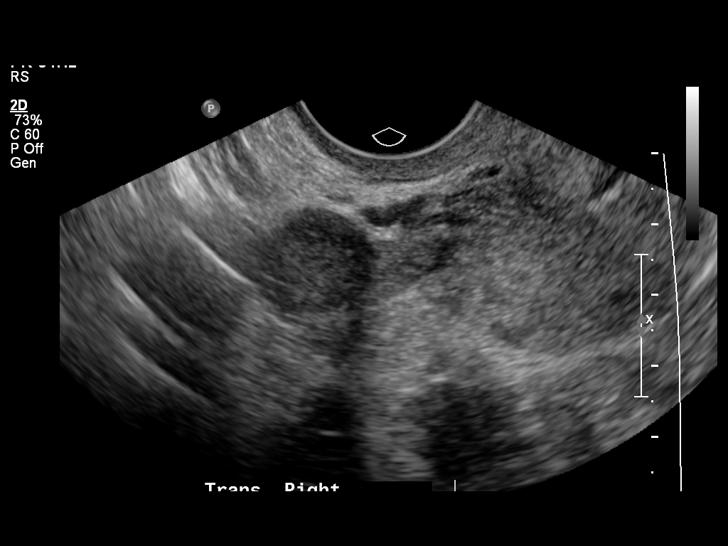
[im 101/105]
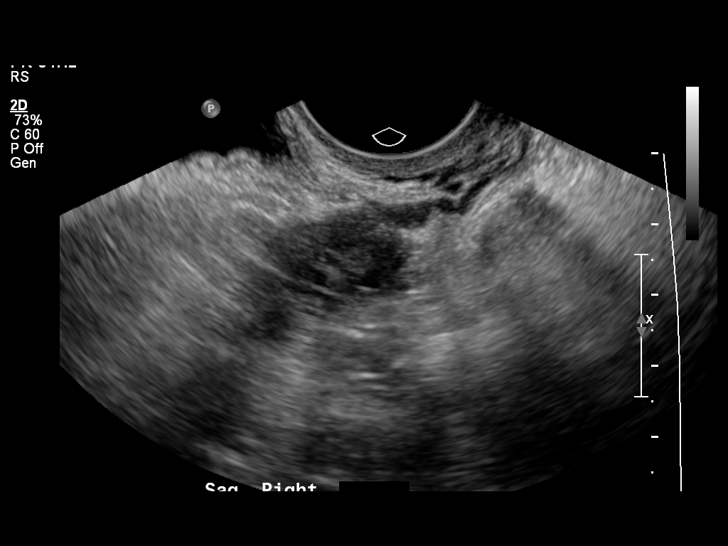

[13 of 28 positions shown; findings below may reference images not displayed]

OBSTETRICS REPORT
                      (Signed Final [DATE] [DATE])

Service(s) Provided

 US OB TRANSVAGINAL                                    76817.0
 US OB COMP LESS 14 WKS                                76801.0
Indications

 Unsure of LMP;  Establish Gestational [AGE]
 Hypertension - Chronic/Pre-existing
 Poor obstetric history: Previous gestational          [NE]
 diabetes
 Cigarette smoker
 Unsure IUD placement
Fetal Evaluation

 Num Of Fetuses:    1
 Preg. Location:    Intrauterine
 Gest. Sac:         Intrauterine
 Yolk Sac:          Visualized
 Fetal Pole:        Not visualized
 Cardiac Activity:  No embryo visualized
Biometry

 GS:       4.8  mm    G. Age:   4w 6d                  EDD:   [DATE]
Cervix Uterus Adnexa

 Cervix:       Normal appearance by transvaginal scan
 Uterus:       No abnormality visualized.
 Cul De Sac:   No free fluid seen.

 Left Ovary:   Simple cyst measuring. 2.3cm x 2.4cm x 2.6cm
 Right Ovary:  Within normal limits.
Impression

 Intrauterine gestational sac and yolk sac present but no fetal
 pole or cardiac activity noted. Recommend follow up US in 10
 days and correlation with serial quantitative bHCG. IUD not
 visualized.

## 2012-07-08 MED ORDER — LABETALOL HCL 100 MG PO TABS: 200.0000 mg | ORAL_TABLET | Freq: Two times a day (BID) | ORAL | Status: DC

## 2012-07-08 NOTE — Telephone Encounter (Signed)
Patient has had 5 positive pregnancy tests and should have Mirena IUD but feels like maybe it fell out while on vacation approximately 3 months ago.  Test was negative in early December but positive on December 27, 28!

## 2012-07-08 NOTE — Progress Notes (Signed)
Subjective:    [name] is a [id number] [redacted]w[redacted]d being seen today for her first obstetrical visit.  Her obstetrical history is significant for smoker and hypertension.. Patient does intend to breast feed. Pregnancy history fully reviewed.  Pt. With IUD inserted 1/13.  No cycle x 10 months.  Felt strings in 8/13.  Then very heavy cycle in 10/13.  Cycle again in 11/13.  Started feeling weird, took home UPT, which was +.  U/s today shows no IUD, and IUGS with yolk sac.  Patient reports no complaints.  Filed Vitals:   07/08/12 1438  BP: 145/87  Weight: 186 lb (84.369 kg)    HISTORY: OB History    Grav Para Term Preterm Abortions TAB SAB Ect Mult Living   6 2 2  0 2 2 0 0 0 2     # Outc Date GA Lbr Len/2nd Wgt Sex Del Anes PTL Lv   1 TAB 2002           2 TAB 2003           3 TRM 4/08 [redacted]w[redacted]d 05:00 6lb2oz(2.778kg) M SVD EPI No Yes   4 TRM 1/10 [redacted]w[redacted]d 10:00 6lb13oz(3.09kg) M SVD EPI No Yes   5 CUR            6 GRA            Comments: System Generated. Please review and update pregnancy details.     Past Medical History  Diagnosis Date  . Hypertension   . Gestational diabetes   . Migraine    Past Surgical History  Procedure Date  . Tonsillectomy 1988  . Tubes in ears Baby  . Dilation and evacuation 03/09/2011    Procedure: DILATATION AND EVACUATION (D&E);  Surgeon: Reva Bores, MD;  Location: [facility];  Service: Gynecology;  Laterality: N/A;   Family History  Problem Relation Age of Onset  . Diabetes Mother   . Osteoarthritis Mother   . Hypertension Mother   . Hypothyroidism Mother   . Heart disease Mother   . Hyperlipidemia Mother   . Migraines Mother   . Mental illness Mother     Depression  . Hypertension Father   . Heart failure Father   . Hepatitis Father   . Cirrhosis Father   . Hyperlipidemia Father   . Migraines Father   . Mental illness Father     Depression  . Breast cancer Maternal Aunt 70  . Cancer Sister 63    Cervical  . Heart disease Sister    Heart Stops  . Migraines Sister   . Seizures Sister 11    unsure if this or heart problem  . Mental illness Sister     depression  . Heart disease Maternal Grandmother      Exam    Uterus:     Pelvic Exam:    Perineum: No Hemorrhoids   Vulva: Bartholin's, Urethra, Skene's normal   Vagina:  normal mucosa       Cervix: multiparous appearance and no lesions   Adnexa: no mass, fullness, tenderness   Bony Pelvis: average  System: Breast:  normal appearance, no masses or tenderness   Skin: normal coloration and turgor, no rashes    Neurologic: oriented   Extremities: normal strength, tone, and muscle mass   HEENT sclera clear, anicteric   Mouth/Teeth mucous membranes moist, pharynx normal without lesions   Neck supple   Cardiovascular: regular rate and rhythm, no murmurs or gallops  Respiratory:  appears well, vitals normal, no respiratory distress, acyanotic, normal RR, ear and throat exam is normal, neck free of mass or lymphadenopathy, chest clear, no wheezing, crepitations, rhonchi, normal symmetric air entry   Abdomen: soft, non-tender; bowel sounds normal; no masses,  no organomegaly          Assessment:    Pregnancy: [id number] Patient Active Problem List  Diagnosis  . HTN (hypertension), benign  . Supervision of other normal pregnancy  . Benign essential hypertension antepartum        Plan:     Initial labs drawn. Prenatal vitamins. Problem list reviewed and updated. Genetic Screening discussed First Screen: undecided.  Ultrasound discussed; fetal survey: results reviewed.  Follow up in 2 days for repeat BHCG to confirm viability, f/u u/s in 10 days.   Baseline labs.      [name] S 07/08/2012

## 2012-07-08 NOTE — Patient Instructions (Signed)
Pregnancy - First Trimester During sexual intercourse, millions of sperm go into the vagina. Only 1 sperm will penetrate and fertilize the female egg while it is in the Fallopian tube. One week later, the fertilized egg implants into the wall of the uterus. An embryo begins to develop into a baby. At 6 to 8 weeks, the eyes and face are formed and the heartbeat can be seen on ultrasound. At the end of 12 weeks (first trimester), all the baby's organs are formed. Now that you are pregnant, you will want to do everything you can to have a healthy baby. Two of the most important things are to get good prenatal care and follow your caregiver's instructions. Prenatal care is all the medical care you receive before the baby's birth. It is given to prevent, find, and treat problems during the pregnancy and childbirth. PRENATAL EXAMS  During prenatal visits, your weight, blood pressure and urine are checked. This is done to make sure you are healthy and progressing normally during the pregnancy.  A pregnant woman should gain 25 to 35 pounds during the pregnancy. However, if you are over weight or underweight, your caregiver will advise you regarding your weight.  Your caregiver will ask and answer questions for you.  Blood work, cervical cultures, other necessary tests and a Pap test are done during your prenatal exams. These tests are done to check on your health and the probable health of your baby. Tests are strongly recommended and done for HIV with your permission. This is the virus that causes AIDS. These tests are done because medications can be given to help prevent your baby from being born with this infection should you have been infected without knowing it. Blood work is also used to find out your blood type, previous infections and follow your blood levels (hemoglobin).  Low hemoglobin (anemia) is common during pregnancy. Iron and vitamins are given to help prevent this. Later in the pregnancy, blood  tests for diabetes will be done along with any other tests if any problems develop. You may need tests to make sure you and the baby are doing well.  You may need other tests to make sure you and the baby are doing well. CHANGES DURING THE FIRST TRIMESTER (THE FIRST 3 MONTHS OF PREGNANCY) Your body goes through many changes during pregnancy. They vary from person to person. Talk to your caregiver about changes you notice and are concerned about. Changes can include:  Your menstrual period stops.  The egg and sperm carry the genes that determine what you look like. Genes from you and your partner are forming a baby. The female genes determine whether the baby is a boy or a girl.  Your body increases in girth and you may feel bloated.  Feeling sick to your stomach (nauseous) and throwing up (vomiting). If the vomiting is uncontrollable, call your caregiver.  Your breasts will begin to enlarge and become tender.  Your nipples may stick out more and become darker.  The need to urinate more. Painful urination may mean you have a bladder infection.  Tiring easily.  Loss of appetite.  Cravings for certain kinds of food.  At first, you may gain or lose a couple of pounds.  You may have changes in your emotions from day to day (excited to be pregnant or concerned something may go wrong with the pregnancy and baby).  You may have more vivid and strange dreams. HOME CARE INSTRUCTIONS   It is very important   to avoid all smoking, alcohol and un-prescribed drugs during your pregnancy. These affect the formation and growth of the baby. Avoid chemicals while pregnant to ensure the delivery of a healthy infant.  Start your prenatal visits by the 12th week of pregnancy. They are usually scheduled monthly at first, then more often in the last 2 months before delivery. Keep your caregiver's appointments. Follow your caregiver's instructions regarding medication use, blood and lab tests, exercise, and  diet.  During pregnancy, you are providing food for you and your baby. Eat regular, well-balanced meals. Choose foods such as meat, fish, milk and other low fat dairy products, vegetables, fruits, and whole-grain breads and cereals. Your caregiver will tell you of the ideal weight gain.  You can help morning sickness by keeping soda crackers at the bedside. Eat a couple before arising in the morning. You may want to use the crackers without salt on them.  Eating 4 to 5 small meals rather than 3 large meals a day also may help the nausea and vomiting.  Drinking liquids between meals instead of during meals also seems to help nausea and vomiting.  A physical sexual relationship may be continued throughout pregnancy if there are no other problems. Problems may be early (premature) leaking of amniotic fluid from the membranes, vaginal bleeding, or belly (abdominal) pain.  Exercise regularly if there are no restrictions. Check with your caregiver or physical therapist if you are unsure of the safety of some of your exercises. Greater weight gain will occur in the last 2 trimesters of pregnancy. Exercising will help:  Control your weight.  Keep you in shape.  Prepare you for labor and delivery.  Help you lose your pregnancy weight after you deliver your baby.  Wear a good support or jogging bra for breast tenderness during pregnancy. This may help if worn during sleep too.  Ask when prenatal classes are available. Begin classes when they are offered.  Do not use hot tubs, steam rooms or saunas.  Wear your seat belt when driving. This protects you and your baby if you are in an accident.  Avoid raw meat, uncooked cheese, cat litter boxes and soil used by cats throughout the pregnancy. These carry germs that can cause birth defects in the baby.  The first trimester is a good time to visit your dentist for your dental health. Getting your teeth cleaned is OK. Use a softer toothbrush and brush  gently during pregnancy.  Ask for help if you have financial, counseling or nutritional needs during pregnancy. Your caregiver will be able to offer counseling for these needs as well as refer you for other special needs.  Do not take any medications or herbs unless told by your caregiver.  Inform your caregiver if there is any mental or physical domestic violence.  Make a list of emergency phone numbers of family, friends, hospital, and police and fire departments.  Write down your questions. Take them to your prenatal visit.  Do not douche.  Do not cross your legs.  If you have to stand for long periods of time, rotate you feet or take small steps in a circle.  You may have more vaginal secretions that may require a sanitary pad. Do not use tampons or scented sanitary pads. MEDICATIONS AND DRUG USE IN PREGNANCY  Take prenatal vitamins as directed. The vitamin should contain 1 milligram of folic acid. Keep all vitamins out of reach of children. Only a couple vitamins or tablets containing iron may be   fatal to a baby or young child when ingested.  Avoid use of all medications, including herbs, over-the-counter medications, not prescribed or suggested by your caregiver. Only take over-the-counter or prescription medicines for pain, discomfort, or fever as directed by your caregiver. Do not use aspirin, ibuprofen, or naproxen unless directed by your caregiver.  Let your caregiver also know about herbs you may be using.  Alcohol is related to a number of birth defects. This includes fetal alcohol syndrome. All alcohol, in any form, should be avoided completely. Smoking will cause low birth rate and premature babies.  Street or illegal drugs are very harmful to the baby. They are absolutely forbidden. A baby born to an addicted mother will be addicted at birth. The baby will go through the same withdrawal an adult does.  Let your caregiver know about any medications that you have to take  and for what reason you take them. MISCARRIAGE IS COMMON DURING PREGNANCY A miscarriage does not mean you did something wrong. It is not a reason to worry about getting pregnant again. Your caregiver will help you with questions you may have. If you have a miscarriage, you may need minor surgery. SEEK MEDICAL CARE IF:  You have any concerns or worries during your pregnancy. It is better to call with your questions if you feel they cannot wait, rather than worry about them. SEEK IMMEDIATE MEDICAL CARE IF:   An unexplained oral temperature above 102 F (38.9 C) develops, or as your caregiver suggests.  You have leaking of fluid from the vagina (birth canal). If leaking membranes are suspected, take your temperature and inform your caregiver of this when you call.  There is vaginal spotting or bleeding. Notify your caregiver of the amount and how many pads are used.  You develop a bad smelling vaginal discharge with a change in the color.  You continue to feel sick to your stomach (nauseated) and have no relief from remedies suggested. You vomit blood or coffee ground-like materials.  You lose more than 2 pounds of weight in 1 week.  You gain more than 2 pounds of weight in 1 week and you notice swelling of your face, hands, feet, or legs.  You gain 5 pounds or more in 1 week (even if you do not have swelling of your hands, face, legs, or feet).  You get exposed to German measles and have never had them.  You are exposed to fifth disease or chickenpox.  You develop belly (abdominal) pain. Round ligament discomfort is a common non-cancerous (benign) cause of abdominal pain in pregnancy. Your caregiver still must evaluate this.  You develop headache, fever, diarrhea, pain with urination, or shortness of breath.  You fall or are in a car accident or have any kind of trauma.  There is mental or physical violence in your home. Document Released: 06/12/2001 Document Revised: 09/10/2011  Document Reviewed: 12/14/2008 [employer] Patient Information 2013 [employer].  

## 2012-07-08 NOTE — Assessment & Plan Note (Signed)
Change to Labetalol and check baseline labs and 24 hour urine.  Serial U/S for growth and 2x/wk testing @ 32 wks.

## 2012-07-08 NOTE — Assessment & Plan Note (Addendum)
Will rule in viable IUP

## 2012-07-09 LAB — HCG, QUANTITATIVE, PREGNANCY: hCG, Beta Chain, Quant, S: 1849.3 m[IU]/mL

## 2012-07-10 ENCOUNTER — Other Ambulatory Visit (INDEPENDENT_AMBULATORY_CARE_PROVIDER_SITE_OTHER): Payer: Medicaid Other | Admitting: Gynecology

## 2012-07-10 DIAGNOSIS — Z3480 Encounter for supervision of other normal pregnancy, unspecified trimester: Secondary | ICD-10-CM

## 2012-07-10 LAB — OBSTETRIC PANEL
Basophils Absolute: 0 10*3/uL (ref 0.0–0.1)
Basophils Relative: 0 % (ref 0–1)
Eosinophils Absolute: 0.1 10*3/uL (ref 0.0–0.7)
Hepatitis B Surface Ag: NEGATIVE
MCH: 30.9 pg (ref 26.0–34.0)
MCHC: 34.8 g/dL (ref 30.0–36.0)
Monocytes Relative: 7 % (ref 3–12)
Neutrophils Relative %: 65 % (ref 43–77)
Platelets: 271 10*3/uL (ref 150–400)
RDW: 13.5 % (ref 11.5–15.5)
Rh Type: POSITIVE

## 2012-07-10 LAB — CULTURE, OB URINE
Colony Count: NO GROWTH
Organism ID, Bacteria: NO GROWTH

## 2012-07-14 ENCOUNTER — Other Ambulatory Visit (INDEPENDENT_AMBULATORY_CARE_PROVIDER_SITE_OTHER): Payer: Medicaid Other | Admitting: *Deleted

## 2012-07-14 DIAGNOSIS — O169 Unspecified maternal hypertension, unspecified trimester: Secondary | ICD-10-CM

## 2012-07-14 DIAGNOSIS — O09299 Supervision of pregnancy with other poor reproductive or obstetric history, unspecified trimester: Secondary | ICD-10-CM

## 2012-07-14 NOTE — Progress Notes (Signed)
Patient given lab results and reassured.  She will follow up as scheduled.

## 2012-07-15 LAB — COMPREHENSIVE METABOLIC PANEL
ALT: 18 U/L (ref 0–35)
AST: 16 U/L (ref 0–37)
Alkaline Phosphatase: 65 U/L (ref 39–117)
BUN: 9 mg/dL (ref 6–23)
Calcium: 9.5 mg/dL (ref 8.4–10.5)
Creat: 0.67 mg/dL (ref 0.50–1.10)
Total Bilirubin: 0.3 mg/dL (ref 0.3–1.2)

## 2012-07-15 LAB — CBC WITH DIFFERENTIAL/PLATELET
Eosinophils Relative: 1 % (ref 0–5)
HCT: 38.4 % (ref 36.0–46.0)
Hemoglobin: 13.1 g/dL (ref 12.0–15.0)
Lymphocytes Relative: 26 % (ref 12–46)
Lymphs Abs: 2 10*3/uL (ref 0.7–4.0)
MCV: 89.1 fL (ref 78.0–100.0)
Monocytes Relative: 8 % (ref 3–12)
Platelets: 260 10*3/uL (ref 150–400)
RBC: 4.31 MIL/uL (ref 3.87–5.11)
WBC: 7.4 10*3/uL (ref 4.0–10.5)

## 2012-07-21 ENCOUNTER — Telehealth: Payer: Self-pay | Admitting: *Deleted

## 2012-07-21 ENCOUNTER — Ambulatory Visit (HOSPITAL_COMMUNITY)
Admission: RE | Admit: 2012-07-21 | Discharge: 2012-07-21 | Disposition: A | Discharge status: Home / Self Care | Payer: Medicaid Other | Source: Ambulatory Visit | Attending: Family Medicine | Admitting: Family Medicine

## 2012-07-21 DIAGNOSIS — O2630 Retained intrauterine contraceptive device in pregnancy, unspecified trimester: Secondary | ICD-10-CM

## 2012-07-21 DIAGNOSIS — O10019 Pre-existing essential hypertension complicating pregnancy, unspecified trimester: Secondary | ICD-10-CM | POA: Insufficient documentation

## 2012-07-21 DIAGNOSIS — O99330 Smoking (tobacco) complicating pregnancy, unspecified trimester: Secondary | ICD-10-CM | POA: Insufficient documentation

## 2012-07-21 DIAGNOSIS — O09299 Supervision of pregnancy with other poor reproductive or obstetric history, unspecified trimester: Secondary | ICD-10-CM | POA: Insufficient documentation

## 2012-07-21 IMAGING — US US OB TRANSVAGINAL
1 series · 14 of 23 positions shown · non-contrast
Comparison: none

[Series 1: us ob transvaginal · 23 acquisitions, 14 frames shown]
[im 1/23]
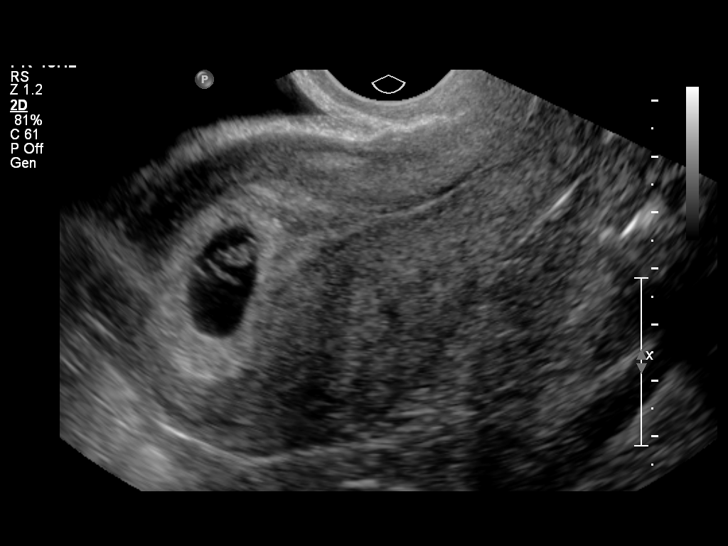
[im 3/23]
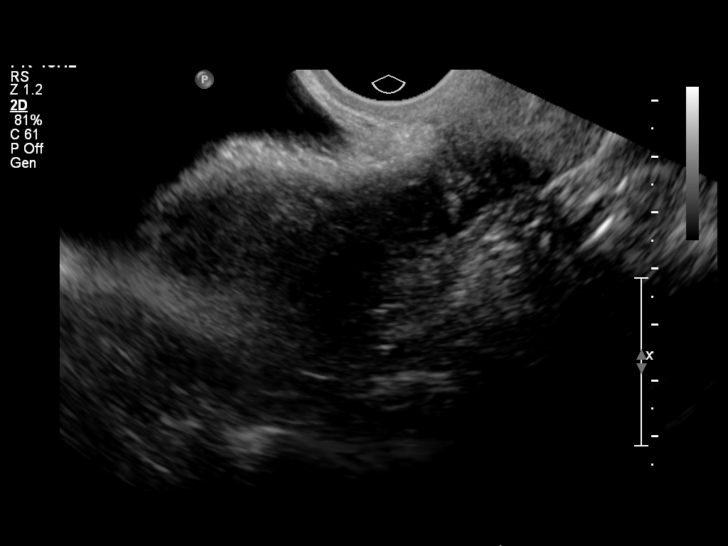
[im 5/23]
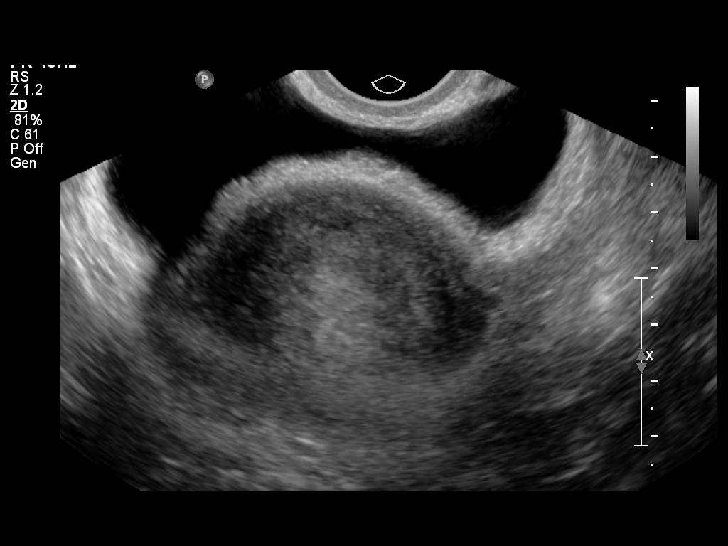
[im 6/23]
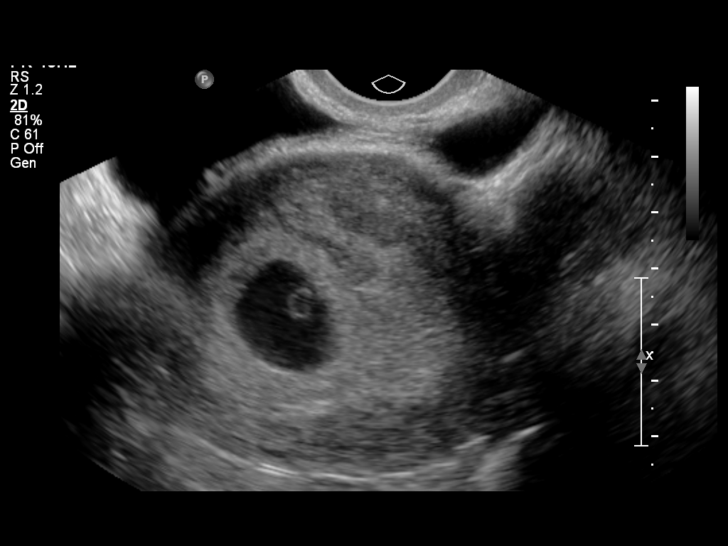
[im 8/23]
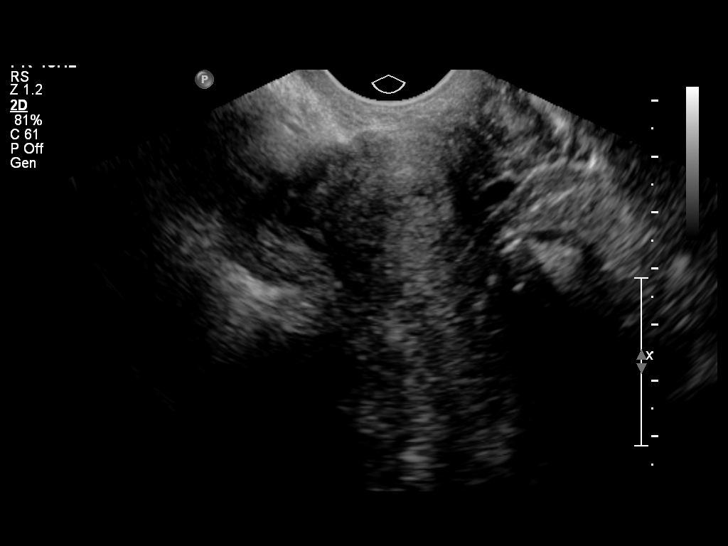
[im 10/23]
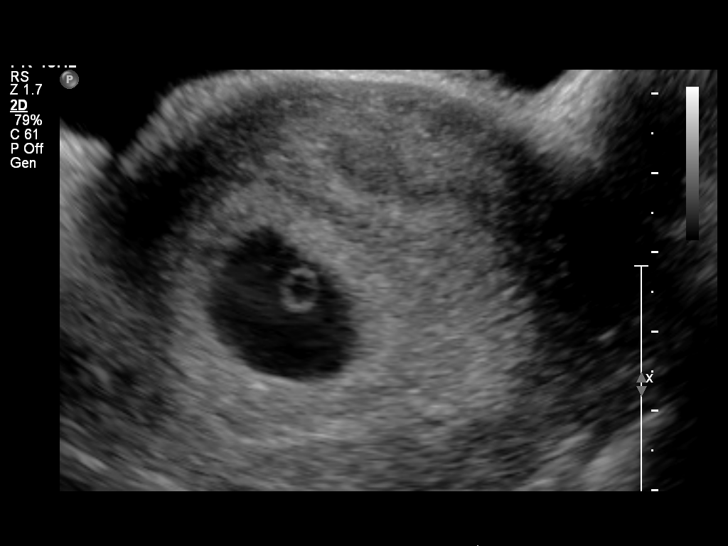
[im 11/23]
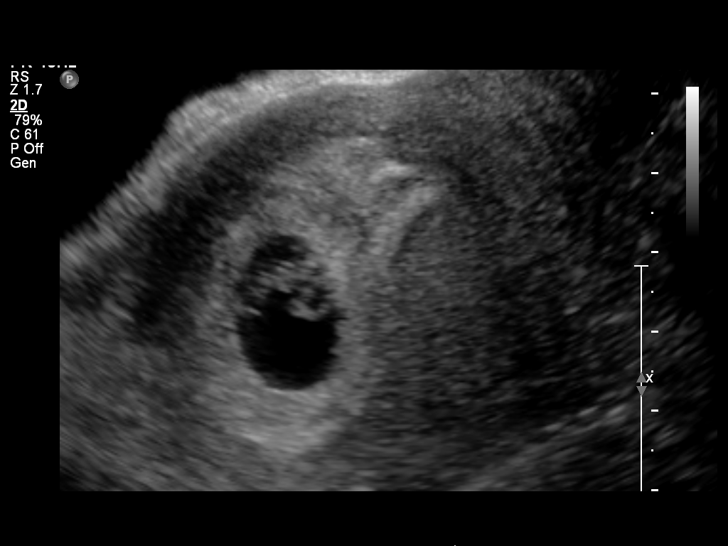
[im 13/23]
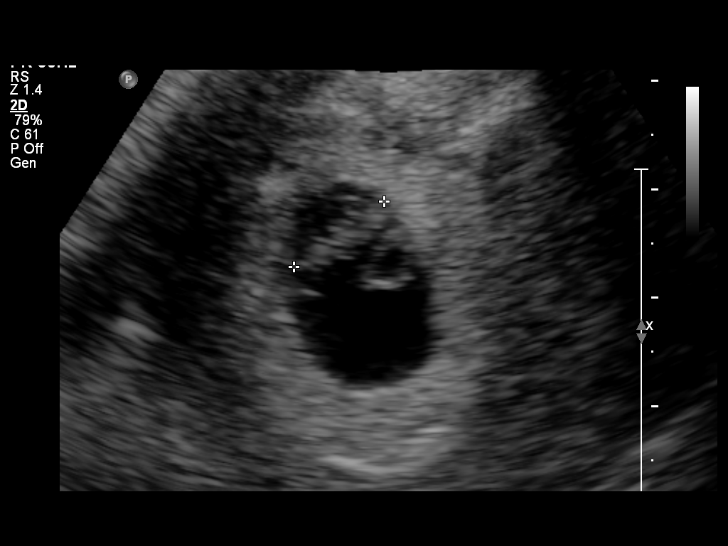
[im 14/23]
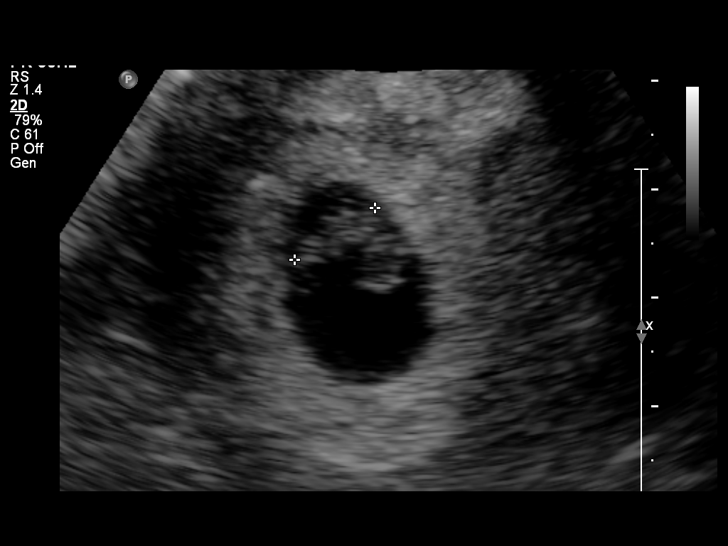
[im 16/23]
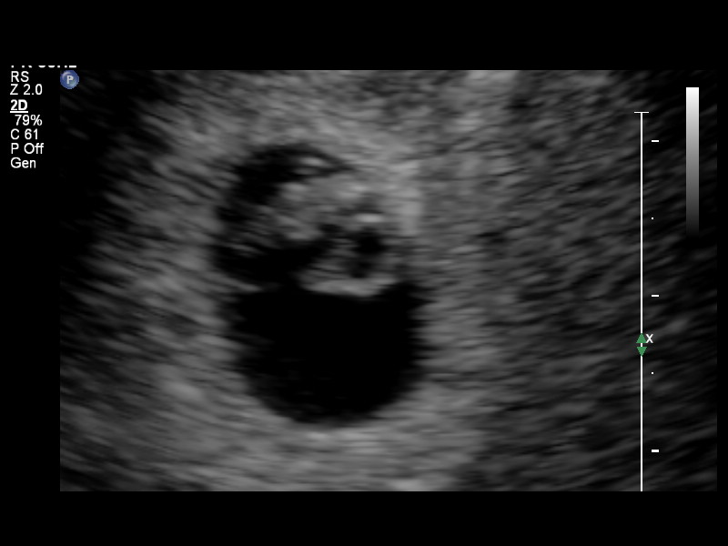
[im 18/23]
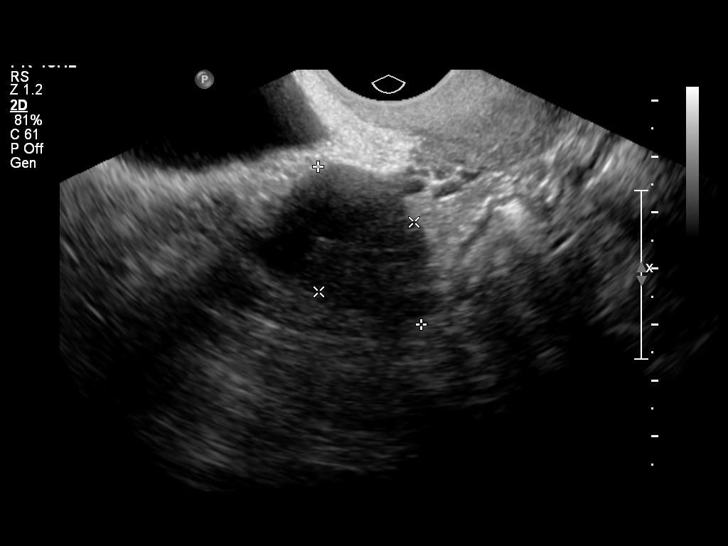
[im 19/23]
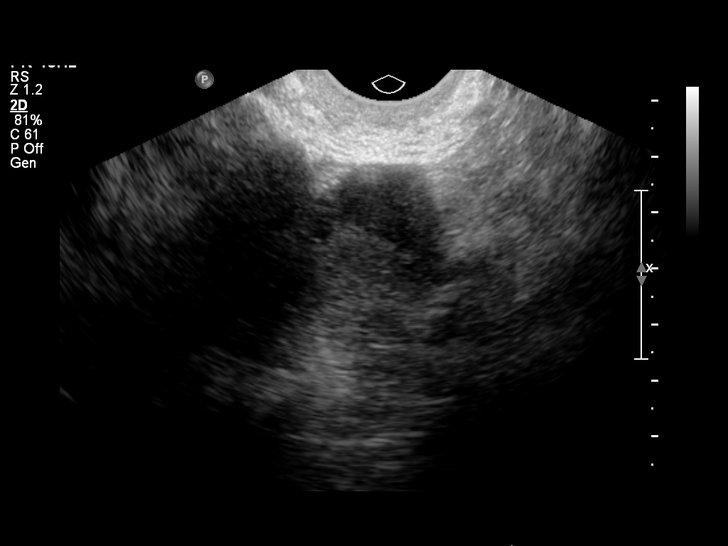
[im 21/23]
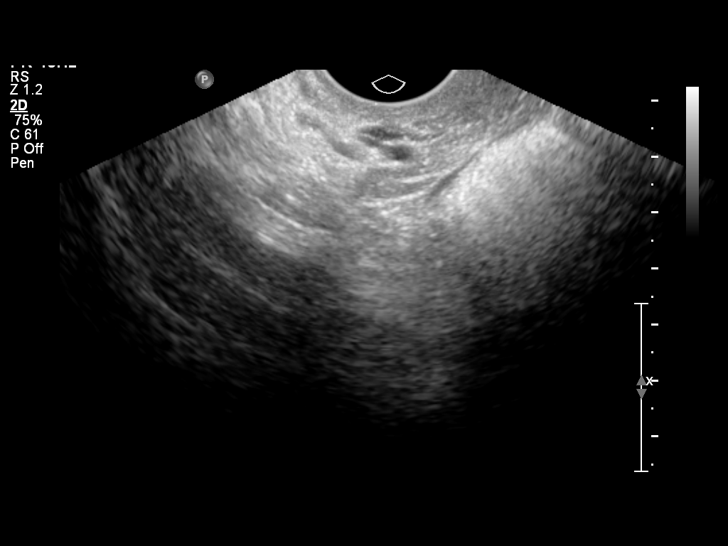
[im 23/23]
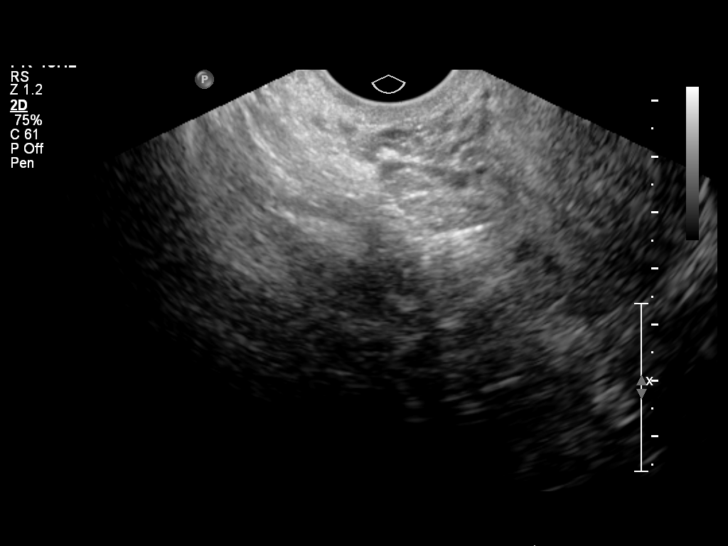

[14 of 23 positions shown; findings below may reference images not displayed]

OBSTETRICS REPORT
                      (Signed Final [DATE] [DATE])

Service(s) Provided

 US OB TRANSVAGINAL                                    76817.0
Indications

 Pregnancy with inconclusive fetal viability           [BC]
 Hypertension - Chronic/Pre-existing
 Poor obstetric history: Previous gestational          [BC]
 diabetes
 Cigarette smoker
 Unsure IUD placement
Fetal Evaluation

 Num Of Fetuses:    1
 Preg. Location:    Intrauterine
 Gest. Sac:         Intrauterine
 Yolk Sac:          Visualized
 Fetal Pole:        Visualized
 Fetal Heart Rate:  140                         bpm
 Cardiac Activity:  Observed
Biometry

 CRL:      9.8  mm    G. Age:   7w 0d                  EDD:   [DATE]
Gestational Age

 Best:          7w 0d     Det. By:   U/S C R L ([DATE])     EDD:   [DATE]
Cervix Uterus Adnexa

 Cervix:       Closed
 Uterus:       No abnormality visualized.
 Cul De Sac:   No free fluid seen.

 Left Ovary:   Within normal limits. Small corpus luteum noted.
 Right Ovary:  Previously seen
 Adnexa:     No abnormality visualized.
Impression

 Single living intrauterine embryo. The estimated gestational
 age is 7w 0d based on U/S C R L ([DATE]).

## 2012-07-21 NOTE — Telephone Encounter (Signed)
Patient wishes to have ultrasound done today to confirm her pregnancy due to past miscarriage she is worried and concerned.

## 2012-07-21 NOTE — Telephone Encounter (Signed)
Ultrasound to follow up needs to be ob transvaginal not less than 14 weeks.  Ultrasound changed per Victorino Dike at radiology and scheduled for 10 oclock today.

## 2012-08-05 ENCOUNTER — Other Ambulatory Visit: Payer: Self-pay | Admitting: Obstetrics & Gynecology

## 2012-08-05 ENCOUNTER — Encounter: Payer: Self-pay | Admitting: Obstetrics & Gynecology

## 2012-08-05 ENCOUNTER — Ambulatory Visit (INDEPENDENT_AMBULATORY_CARE_PROVIDER_SITE_OTHER): Payer: Self-pay | Admitting: Obstetrics & Gynecology

## 2012-08-05 VITALS — BP 123/79 | Wt 191.0 lb

## 2012-08-05 DIAGNOSIS — IMO0002 Reserved for concepts with insufficient information to code with codable children: Secondary | ICD-10-CM

## 2012-08-05 DIAGNOSIS — Z3682 Encounter for antenatal screening for nuchal translucency: Secondary | ICD-10-CM

## 2012-08-05 DIAGNOSIS — O10019 Pre-existing essential hypertension complicating pregnancy, unspecified trimester: Secondary | ICD-10-CM

## 2012-08-05 DIAGNOSIS — Z3480 Encounter for supervision of other normal pregnancy, unspecified trimester: Secondary | ICD-10-CM

## 2012-08-05 DIAGNOSIS — O09299 Supervision of pregnancy with other poor reproductive or obstetric history, unspecified trimester: Secondary | ICD-10-CM

## 2012-08-05 NOTE — Progress Notes (Signed)
Routine visit. No problems. She wants First screen, will be ordered today. Discussed contraception.

## 2012-08-13 ENCOUNTER — Inpatient Hospital Stay (HOSPITAL_COMMUNITY)
Admission: AD | Admit: 2012-08-13 | Discharge: 2012-08-13 | Disposition: A | Discharge status: Home / Self Care | Payer: Medicaid Other | Source: Ambulatory Visit | Attending: Obstetrics & Gynecology | Admitting: Obstetrics & Gynecology

## 2012-08-13 ENCOUNTER — Inpatient Hospital Stay (HOSPITAL_COMMUNITY): Payer: Medicaid Other

## 2012-08-13 ENCOUNTER — Encounter (HOSPITAL_COMMUNITY): Payer: Self-pay

## 2012-08-13 DIAGNOSIS — O09299 Supervision of pregnancy with other poor reproductive or obstetric history, unspecified trimester: Secondary | ICD-10-CM

## 2012-08-13 DIAGNOSIS — R197 Diarrhea, unspecified: Secondary | ICD-10-CM

## 2012-08-13 DIAGNOSIS — IMO0002 Reserved for concepts with insufficient information to code with codable children: Secondary | ICD-10-CM

## 2012-08-13 DIAGNOSIS — O210 Mild hyperemesis gravidarum: Secondary | ICD-10-CM

## 2012-08-13 DIAGNOSIS — O10019 Pre-existing essential hypertension complicating pregnancy, unspecified trimester: Secondary | ICD-10-CM

## 2012-08-13 DIAGNOSIS — Z8632 Personal history of gestational diabetes: Secondary | ICD-10-CM

## 2012-08-13 DIAGNOSIS — Z3480 Encounter for supervision of other normal pregnancy, unspecified trimester: Secondary | ICD-10-CM

## 2012-08-13 LAB — URINALYSIS, ROUTINE W REFLEX MICROSCOPIC
Bilirubin Urine: NEGATIVE
Glucose, UA: NEGATIVE mg/dL
Hgb urine dipstick: NEGATIVE
Specific Gravity, Urine: <1.005 — ABNORMAL LOW (ref 1.005–1.030)
Urobilinogen, UA: 0.2 mg/dL (ref 0.0–1.0)
pH: 6 (ref 5.0–8.0)

## 2012-08-13 NOTE — MAU Provider Note (Signed)
History     CSN: [id number]  Arrival date and time: 08/13/12 1043   First Provider Initiated Contact with Patient 08/13/12 1127      Chief Complaint  Patient presents with  . Fatigue   HPI [name] 33 y.o.  [redacted]w[redacted]d  Client has been sick with vomiting and diarrhea x 3 days.  Has a history of miscarriages and is not feeling right today.  Fears the baby is no longer living.  Some cramping but has had cramping previously.  Is no worse.  No vaginal bleeding.   OB History   Grav Para Term Preterm Abortions TAB SAB Ect Mult Living   6 2 2  0 3 2 1  0 0 2      Past Medical History  Diagnosis Date  . Hypertension   . Gestational diabetes   . Migraine     Past Surgical History  Procedure Laterality Date  . Tonsillectomy  1988  . Tubes in ears  Baby  . Dilation and evacuation  03/09/2011    Procedure: DILATATION AND EVACUATION (D&E);  Surgeon: Reva Bores, MD;  Location: [facility];  Service: Gynecology;  Laterality: N/A;    Family History  Problem Relation Age of Onset  . Diabetes Mother   . Osteoarthritis Mother   . Hypertension Mother   . Hypothyroidism Mother   . Heart disease Mother   . Hyperlipidemia Mother   . Migraines Mother   . Mental illness Mother     Depression  . Hypertension Father   . Heart failure Father   . Hepatitis Father   . Cirrhosis Father   . Hyperlipidemia Father   . Migraines Father   . Mental illness Father     Depression  . Breast cancer Maternal Aunt 70  . Cancer Sister 64    Cervical  . Heart disease Sister     Heart Stops  . Migraines Sister   . Seizures Sister 11    unsure if this or heart problem  . Mental illness Sister     depression  . Heart disease Maternal Grandmother     History  Substance Use Topics  . Smoking status: Current Some Day Smoker -- 0.50 packs/day for 12 years    Types: Cigarettes  . Smokeless tobacco: Never Used  . Alcohol Use: No    Allergies: No Known Allergies  Prescriptions prior to admission   Medication Sig Dispense Refill  . acetaminophen (TYLENOL) 325 MG tablet Take 650 mg by mouth every 6 (six) hours as needed for pain.      Marland Kitchen atenolol (TENORMIN) 50 MG tablet Take 50 mg by mouth daily.      . flintstones complete (FLINTSTONES) 60 MG chewable tablet Chew 1 tablet by mouth daily.      . [DISCONTINUED] labetalol (NORMODYNE) 100 MG tablet Take 2 tablets (200 mg total) by mouth 2 (two) times daily.  60 tablet  3  . [DISCONTINUED] Prenat w/o A Vit-FeFum-FePo-FA (CONCEPT OB) 130-92.4-1 MG CAPS Take 1 capsule by mouth daily.  30 capsule  11    Review of Systems  Constitutional: Negative for fever.  Gastrointestinal: Positive for nausea, vomiting and abdominal pain.  Genitourinary:       No vaginal discharge. No vaginal bleeding. No dysuria.   Physical Exam   Blood pressure 135/90, pulse 84, temperature 97.8 F (36.6 C), temperature source Oral, resp. rate 18, height 5\' 4"  (1.626 m), weight 85.276 kg (188 lb), last menstrual period 05/28/2012, SpO2 100.00%.  Physical Exam  Nursing note and vitals reviewed. Constitutional: She is oriented to person, place, and time. She appears well-developed and well-nourished.  HENT:  Head: Normocephalic.  Eyes: EOM are normal.  Neck: Neck supple.  GI: Soft. There is no tenderness. There is no rebound and no guarding.  No FHT heard with doppler  Musculoskeletal: Normal range of motion.  Neurological: She is alert and oriented to person, place, and time.  Skin: Skin is warm and dry.  Psychiatric: She has a normal mood and affect.    MAU Course  Procedures  MDM   Assessment and Plan  Viable pregnancy  Client left after ultrasound without talking with any staff member.  BURLESON,TERRI 08/13/2012, 11:29 AM

## 2012-08-26 ENCOUNTER — Other Ambulatory Visit: Payer: Self-pay

## 2012-08-26 ENCOUNTER — Ambulatory Visit (HOSPITAL_COMMUNITY)
Admission: RE | Admit: 2012-08-26 | Discharge: 2012-08-26 | Disposition: A | Discharge status: Home / Self Care | Payer: Medicaid Other | Source: Ambulatory Visit | Attending: Obstetrics & Gynecology | Admitting: Obstetrics & Gynecology

## 2012-08-26 DIAGNOSIS — Z3682 Encounter for antenatal screening for nuchal translucency: Secondary | ICD-10-CM

## 2012-08-26 DIAGNOSIS — Z3689 Encounter for other specified antenatal screening: Secondary | ICD-10-CM | POA: Insufficient documentation

## 2012-08-26 DIAGNOSIS — O10019 Pre-existing essential hypertension complicating pregnancy, unspecified trimester: Secondary | ICD-10-CM | POA: Insufficient documentation

## 2012-08-26 DIAGNOSIS — O351XX0 Maternal care for (suspected) chromosomal abnormality in fetus, not applicable or unspecified: Secondary | ICD-10-CM | POA: Insufficient documentation

## 2012-08-26 DIAGNOSIS — O3510X0 Maternal care for (suspected) chromosomal abnormality in fetus, unspecified, not applicable or unspecified: Secondary | ICD-10-CM | POA: Insufficient documentation

## 2012-08-26 IMAGING — US US OB NUCHAL TRANSLUCENCY 1ST GEST
1 series · 13 of 25 positions shown · non-contrast
Comparison: none

[Series 1: us ob nuchal translucency 1st gest · 0.18mm/px · 13 of 25 slices shown]
[im 1/25]
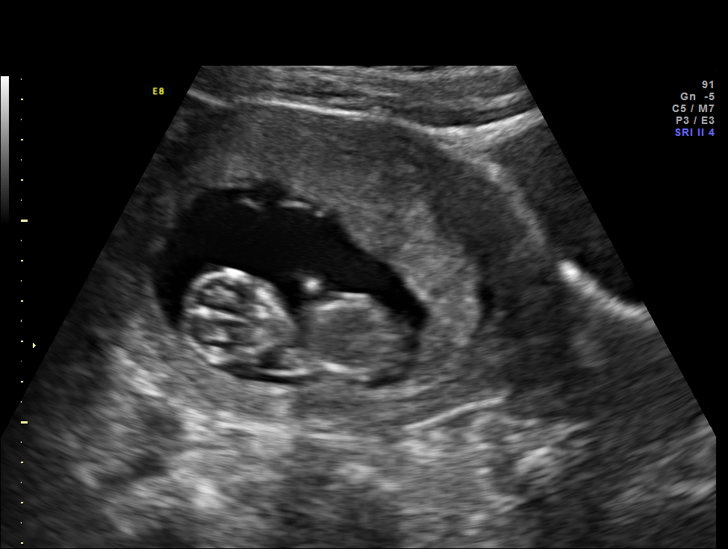
[im 3/25]
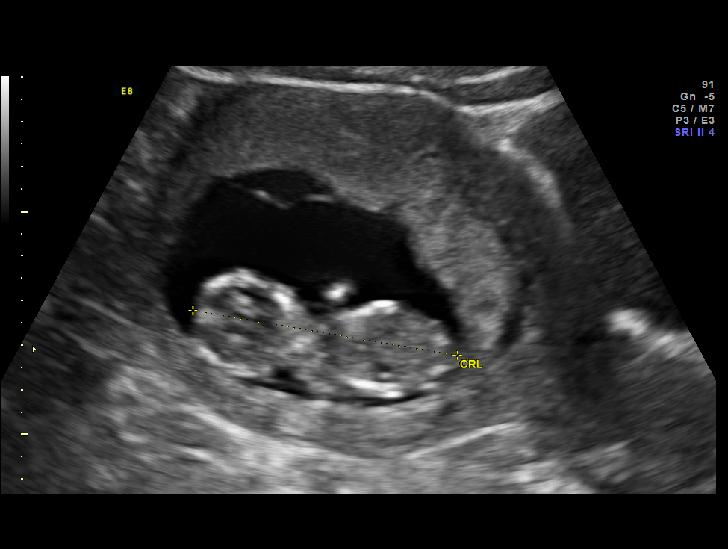
[im 5/25]
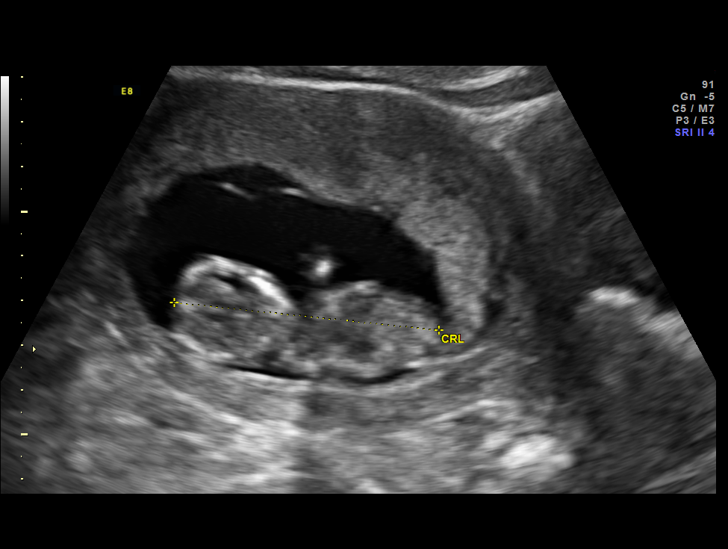
[im 7/25]
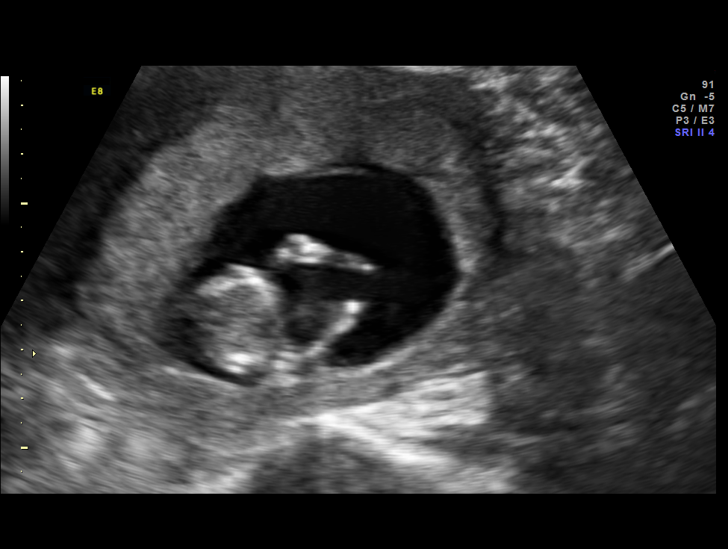
[im 9/25]
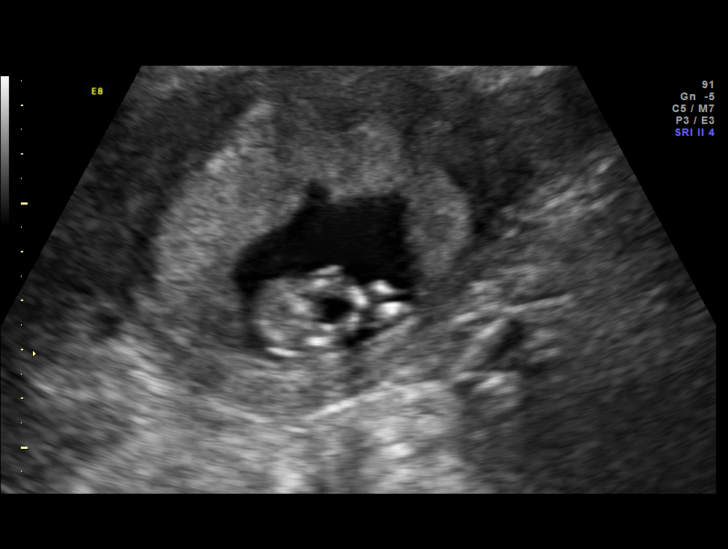
[im 11/25]
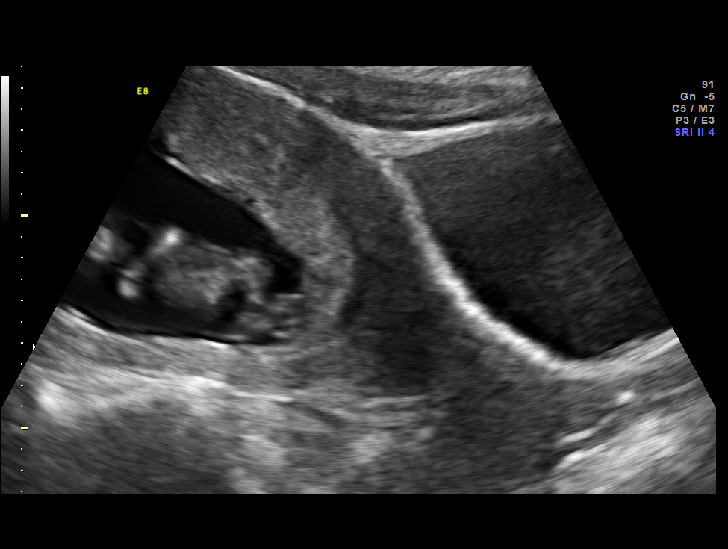
[im 13/25]
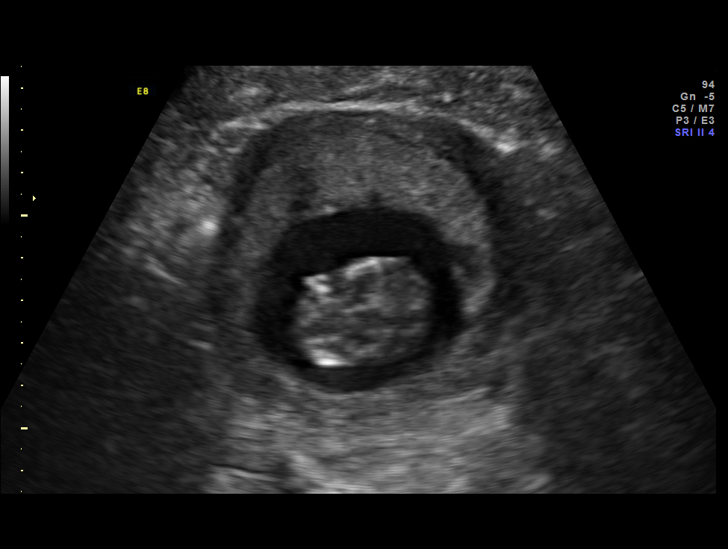
[im 15/25]
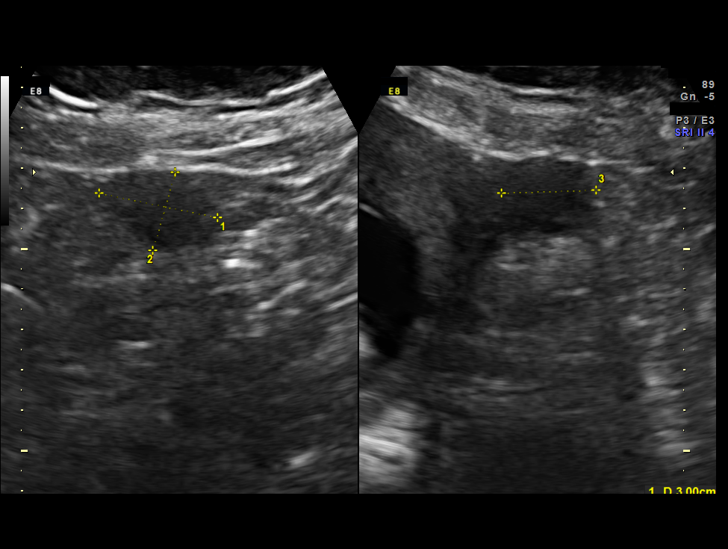
[im 17/25]
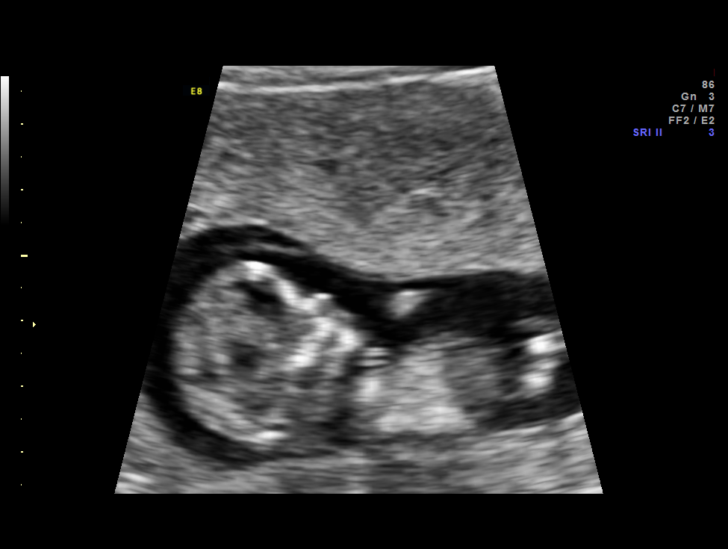
[im 19/25]
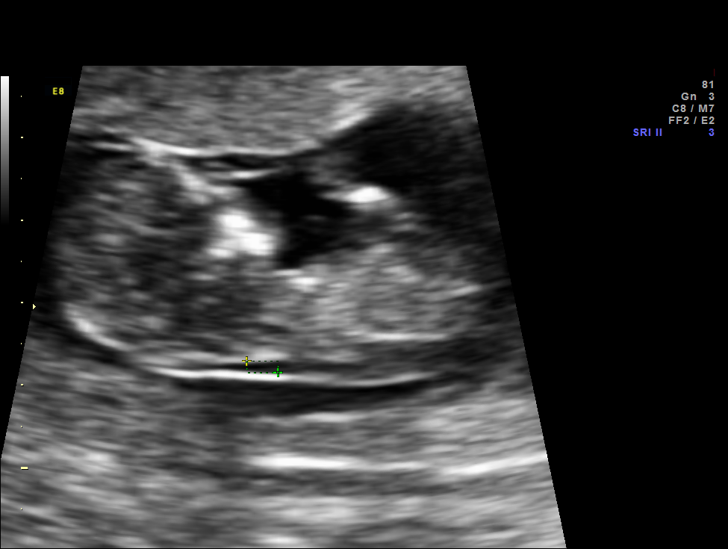
[im 21/25]
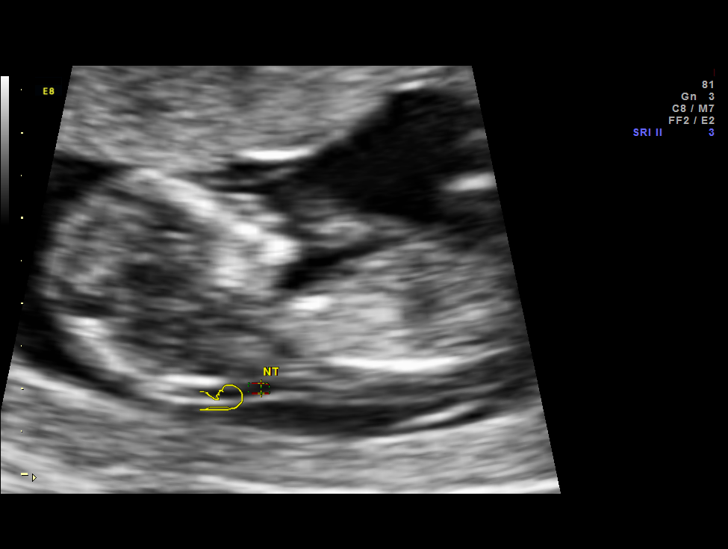
[im 23/25]
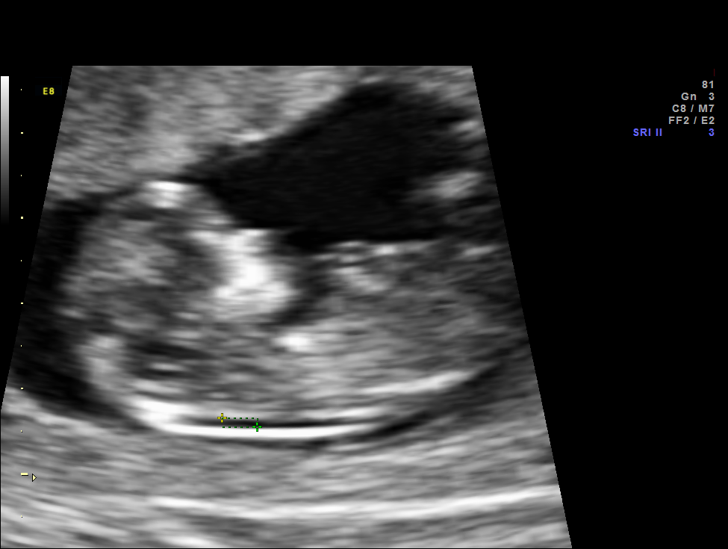
[im 25/25]
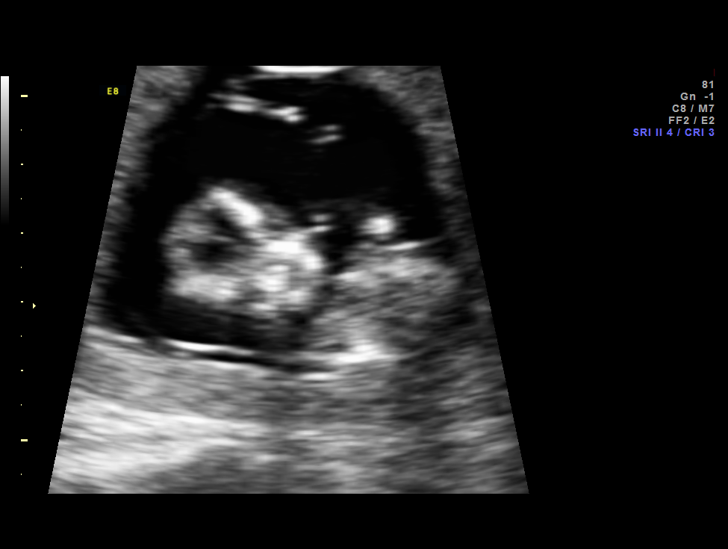

[13 of 25 positions shown; findings below may reference images not displayed]

OBSTETRICS REPORT
                      (Signed Final [DATE] [DATE])

Service(s) Provided

 US FETAL NUCHAL TRANSLUCENCY                          76813.0
 MEASUREMENT
Indications

 First trimester aneuploidy screen (NT)                655.13 [XG]
 Hypertension - Chronic/Pre-existing
Fetal Evaluation

 Num Of Fetuses:    1
 Yolk Sac:          Visualized
 Fetal Pole:        Visualized
 Fetal Heart Rate:  161                          bpm
 Cardiac Activity:  Observed
 Presentation:      Variable
 Placenta:          Anterior, above cervical os
Biometry

 CRL:     60.8  mm     G. Age:  12w 3d                 EDD:    [DATE]
Gestational Age

 LMP:           12w 6d        Date:  [DATE]                 EDD:   [DATE]
 Best:          12w 1d     Det. By:  U/S C R L   ([DATE])   EDD:   [DATE]
Cervix Uterus Adnexa

 Cervix:       Normal appearance by transabdominal scan.
 Uterus:       No abnormality visualized.
 Cul De Sac:   No free fluid seen.

 Left Ovary:    Within normal limits.
 Right Ovary:   Not visualized.

 Adnexa:     No abnormality visualized.
Impression

 Single IUP at 12 [DATE] weeks
 NT of 1.6 mm noted.  A nasal bone was visualiized.
 First trimester aneuploidy screen performed as noted above.
Recommendations

 Please do not draw triple/quad screen, though patient should
 be offered MSAFP for neural tube defect screening.
 Recommend ultrasound for fetal anatomy at approximately
 18 weeks gestation

## 2012-08-26 NOTE — Progress Notes (Signed)
[name]  was seen today for an ultrasound appointment.  See full report in [facility].  Impression: Single IUP at 12 6/7 weeks NT of 1.6 mm noted.  A nasal bone was visualiized. First trimester aneuploidy screen performed as noted above.   Recommendations: Please do not draw triple/quad screen, though patient should be offered MSAFP for neural tube defect screening.  Recommend ultrasound for fetal anatomy at approximately [redacted] weeks gestation  Alpha Gula, MD

## 2012-09-02 ENCOUNTER — Encounter: Payer: Self-pay | Admitting: Obstetrics & Gynecology

## 2012-09-16 ENCOUNTER — Ambulatory Visit (INDEPENDENT_AMBULATORY_CARE_PROVIDER_SITE_OTHER): Payer: Medicaid Other | Admitting: Family Medicine

## 2012-09-16 ENCOUNTER — Encounter: Payer: Self-pay | Admitting: Family Medicine

## 2012-09-16 VITALS — BP 120/78 | Temp 97.1°F | Wt 186.8 lb

## 2012-09-16 DIAGNOSIS — Z3482 Encounter for supervision of other normal pregnancy, second trimester: Secondary | ICD-10-CM

## 2012-09-16 DIAGNOSIS — Z3480 Encounter for supervision of other normal pregnancy, unspecified trimester: Secondary | ICD-10-CM

## 2012-09-16 NOTE — Progress Notes (Signed)
P81, States has been feeling dizzy a few times a week for a week or two,

## 2012-09-16 NOTE — Progress Notes (Signed)
Has lost weight, but doing well. Needs ultrasound for anatomy AFP in a couple of weeks

## 2012-09-16 NOTE — Patient Instructions (Signed)
Pregnancy - Second Trimester The second trimester of pregnancy (3 to 6 months) is a period of rapid growth for you and your baby. At the end of the sixth month, your baby is about 9 inches long and weighs 1 1/2 pounds. You will begin to feel the baby move between 18 and 20 weeks of the pregnancy. This is called quickening. Weight gain is faster. A clear fluid (colostrum) may leak out of your breasts. You may feel small contractions of the womb (uterus). This is known as false labor or Braxton-Hicks contractions. This is like a practice for labor when the baby is ready to be born. Usually, the problems with morning sickness have usually passed by the end of your first trimester. Some women develop small dark blotches (called cholasma, mask of pregnancy) on their face that usually goes away after the baby is born. Exposure to the sun makes the blotches worse. Acne may also develop in some pregnant women and pregnant women who have acne, may find that it goes away. PRENATAL EXAMS  Blood work may continue to be done during prenatal exams. These tests are done to check on your health and the probable health of your baby. Blood work is used to follow your blood levels (hemoglobin). Anemia (low hemoglobin) is common during pregnancy. Iron and vitamins are given to help prevent this. You will also be checked for diabetes between 24 and 28 weeks of the pregnancy. Some of the previous blood tests may be repeated.  The size of the uterus is measured during each visit. This is to make sure that the baby is continuing to grow properly according to the dates of the pregnancy.  Your blood pressure is checked every prenatal visit. This is to make sure you are not getting toxemia.  Your urine is checked to make sure you do not have an infection, diabetes or protein in the urine.  Your weight is checked often to make sure gains are happening at the suggested rate. This is to ensure that both you and your baby are  growing normally.  Sometimes, an ultrasound is performed to confirm the proper growth and development of the baby. This is a test which bounces harmless sound waves off the baby so your caregiver can more accurately determine due dates. Sometimes, a specialized test is done on the amniotic fluid surrounding the baby. This test is called an amniocentesis. The amniotic fluid is obtained by sticking a needle into the belly (abdomen). This is done to check the chromosomes in instances where there is a concern about possible genetic problems with the baby. It is also sometimes done near the end of pregnancy if an early delivery is required. In this case, it is done to help make sure the baby's lungs are mature enough for the baby to live outside of the womb. CHANGES OCCURING IN THE SECOND TRIMESTER OF PREGNANCY Your body goes through many changes during pregnancy. They vary from person to person. Talk to your caregiver about changes you notice that you are concerned about.  During the second trimester, you will likely have an increase in your appetite. It is normal to have cravings for certain foods. This varies from person to person and pregnancy to pregnancy.  Your lower abdomen will begin to bulge.  You may have to urinate more often because the uterus and baby are pressing on your bladder. It is also common to get more bladder infections during pregnancy (pain with urination). You can help this by   drinking lots of fluids and emptying your bladder before and after intercourse.  You may begin to get stretch marks on your hips, abdomen, and breasts. These are normal changes in the body during pregnancy. There are no exercises or medications to take that prevent this change.  You may begin to develop swollen and bulging veins (varicose veins) in your legs. Wearing support hose, elevating your feet for 15 minutes, 3 to 4 times a day and limiting salt in your diet helps lessen the problem.  Heartburn may  develop as the uterus grows and pushes up against the stomach. Antacids recommended by your caregiver helps with this problem. Also, eating smaller meals 4 to 5 times a day helps.  Constipation can be treated with a stool softener or adding bulk to your diet. Drinking lots of fluids, vegetables, fruits, and whole grains are helpful.  Exercising is also helpful. If you have been very active up until your pregnancy, most of these activities can be continued during your pregnancy. If you have been less active, it is helpful to start an exercise program such as walking.  Hemorrhoids (varicose veins in the rectum) may develop at the end of the second trimester. Warm sitz baths and hemorrhoid cream recommended by your caregiver helps hemorrhoid problems.  Backaches may develop during this time of your pregnancy. Avoid heavy lifting, wear low heal shoes and practice good posture to help with backache problems.  Some pregnant women develop tingling and numbness of their hand and fingers because of swelling and tightening of ligaments in the wrist (carpel tunnel syndrome). This goes away after the baby is born.  As your breasts enlarge, you may have to get a bigger bra. Get a comfortable, cotton, support bra. Do not get a nursing bra until the last month of the pregnancy if you will be nursing the baby.  You may get a dark line from your belly button to the pubic area called the linea nigra.  You may develop rosy cheeks because of increase blood flow to the face.  You may develop spider looking lines of the face, neck, arms and chest. These go away after the baby is born. HOME CARE INSTRUCTIONS   It is extremely important to avoid all smoking, herbs, alcohol, and unprescribed drugs during your pregnancy. These chemicals affect the formation and growth of the baby. Avoid these chemicals throughout the pregnancy to ensure the delivery of a healthy infant.  Most of your home care instructions are the same  as suggested for the first trimester of your pregnancy. Keep your caregiver's appointments. Follow your caregiver's instructions regarding medication use, exercise and diet.  During pregnancy, you are providing food for you and your baby. Continue to eat regular, well-balanced meals. Choose foods such as meat, fish, milk and other low fat dairy products, vegetables, fruits, and whole-grain breads and cereals. Your caregiver will tell you of the ideal weight gain.  A physical sexual relationship may be continued up until near the end of pregnancy if there are no other problems. Problems could include early (premature) leaking of amniotic fluid from the membranes, vaginal bleeding, abdominal pain, or other medical or pregnancy problems.  Exercise regularly if there are no restrictions. Check with your caregiver if you are unsure of the safety of some of your exercises. The greatest weight gain will occur in the last 2 trimesters of pregnancy. Exercise will help you:  Control your weight.  Get you in shape for labor and delivery.  Lose weight   after you have the baby.  Wear a good support or jogging bra for breast tenderness during pregnancy. This may help if worn during sleep. Pads or tissues may be used in the bra if you are leaking colostrum.  Do not use hot tubs, steam rooms or saunas throughout the pregnancy.  Wear your seat belt at all times when driving. This protects you and your baby if you are in an accident.  Avoid raw meat, uncooked cheese, cat litter boxes and soil used by cats. These carry germs that can cause birth defects in the baby.  The second trimester is also a good time to visit your dentist for your dental health if this has not been done yet. Getting your teeth cleaned is OK. Use a soft toothbrush. Brush gently during pregnancy.  It is easier to loose urine during pregnancy. Tightening up and strengthening the pelvic muscles will help with this problem. Practice stopping  your urination while you are going to the bathroom. These are the same muscles you need to strengthen. It is also the muscles you would use as if you were trying to stop from passing gas. You can practice tightening these muscles up 10 times a set and repeating this about 3 times per day. Once you know what muscles to tighten up, do not perform these exercises during urination. It is more likely to contribute to an infection by backing up the urine.  Ask for help if you have financial, counseling or nutritional needs during pregnancy. Your caregiver will be able to offer counseling for these needs as well as refer you for other special needs.  Your skin may become oily. If so, wash your face with mild soap, use non-greasy moisturizer and oil or cream based makeup. MEDICATIONS AND DRUG USE IN PREGNANCY  Take prenatal vitamins as directed. The vitamin should contain 1 milligram of folic acid. Keep all vitamins out of reach of children. Only a couple vitamins or tablets containing iron may be fatal to a baby or young child when ingested.  Avoid use of all medications, including herbs, over-the-counter medications, not prescribed or suggested by your caregiver. Only take over-the-counter or prescription medicines for pain, discomfort, or fever as directed by your caregiver. Do not use aspirin.  Let your caregiver also know about herbs you may be using.  Alcohol is related to a number of birth defects. This includes fetal alcohol syndrome. All alcohol, in any form, should be avoided completely. Smoking will cause low birth rate and premature babies.  Street or illegal drugs are very harmful to the baby. They are absolutely forbidden. A baby born to an addicted mother will be addicted at birth. The baby will go through the same withdrawal an adult does. SEEK MEDICAL CARE IF:  You have any concerns or worries during your pregnancy. It is better to call with your questions if you feel they cannot wait,  rather than worry about them. SEEK IMMEDIATE MEDICAL CARE IF:   An unexplained oral temperature above 102 F (38.9 C) develops, or as your caregiver suggests.  You have leaking of fluid from the vagina (birth canal). If leaking membranes are suspected, take your temperature and tell your caregiver of this when you call.  There is vaginal spotting, bleeding, or passing clots. Tell your caregiver of the amount and how many pads are used. Light spotting in pregnancy is common, especially following intercourse.  You develop a bad smelling vaginal discharge with a change in the color from clear   to white.  You continue to feel sick to your stomach (nauseated) and have no relief from remedies suggested. You vomit blood or coffee ground-like materials.  You lose more than 2 pounds of weight or gain more than 2 pounds of weight over 1 week, or as suggested by your caregiver.  You notice swelling of your face, hands, feet, or legs.  You get exposed to German measles and have never had them.  You are exposed to fifth disease or chickenpox.  You develop belly (abdominal) pain. Round ligament discomfort is a common non-cancerous (benign) cause of abdominal pain in pregnancy. Your caregiver still must evaluate you.  You develop a bad headache that does not go away.  You develop fever, diarrhea, pain with urination, or shortness of breath.  You develop visual problems, blurry, or double vision.  You fall or are in a car accident or any kind of trauma.  There is mental or physical violence at home. Document Released: 06/12/2001 Document Revised: 09/10/2011 Document Reviewed: 12/15/2008 [employer] Patient Information 2013 [employer].  Breastfeeding Deciding to breastfeed is one of the best choices you can make for you and your baby. The information that follows gives a brief overview of the benefits of breastfeeding as well as common topics surrounding breastfeeding. BENEFITS OF  BREASTFEEDING For the baby  The first milk (colostrum) helps the baby's digestive system function better.   There are antibodies in the mother's milk that help the baby fight off infections.   The baby has a lower incidence of asthma, allergies, and sudden infant death syndrome (SIDS).   The nutrients in breast milk are better for the baby than infant formulas, and breast milk helps the baby's brain grow better.   Babies who breastfeed have less gas, colic, and constipation.  For the mother  Breastfeeding helps develop a very special bond between the mother and her baby.   Breastfeeding is convenient, always available at the correct temperature, and costs nothing.   Breastfeeding burns calories in the mother and helps her lose weight that was gained during pregnancy.   Breastfeeding makes the uterus contract back down to normal size faster and slows bleeding following delivery.   Breastfeeding mothers have a lower risk of developing breast cancer.  BREASTFEEDING FREQUENCY  A healthy, full-term baby may breastfeed as often as every hour or space his or her feedings to every 3 hours.   Watch your baby for signs of hunger. Nurse your baby if he or she shows signs of hunger. How often you nurse will vary from baby to baby.   Nurse as often as the baby requests, or when you feel the need to reduce the fullness of your breasts.   Awaken the baby if it has been 3 4 hours since the last feeding.   Frequent feeding will help the mother make more milk and will help prevent problems, such as sore nipples and engorgement of the breasts.  BABY'S POSITION AT THE BREAST  Whether lying down or sitting, be sure that the baby's tummy is facing your tummy.   Support the breast with 4 fingers underneath the breast and the thumb above. Make sure your fingers are well away from the nipple and baby's mouth.   Stroke the baby's lips gently with your finger or nipple.   When the  baby's mouth is open wide enough, place all of your nipple and as much of the areola as possible into your baby's mouth.   Pull the baby in   close so the tip of the nose and the baby's cheeks touch the breast during the feeding.  FEEDINGS AND SUCTION  The length of each feeding varies from baby to baby and from feeding to feeding.   The baby must suck about 2 3 minutes for your milk to get to him or her. This is called a "let down." For this reason, allow the baby to feed on each breast as long as he or she wants. Your baby will end the feeding when he or she has received the right balance of nutrients.   To break the suction, put your finger into the corner of the baby's mouth and slide it between his or her gums before removing your breast from his or her mouth. This will help prevent sore nipples.  HOW TO TELL WHETHER YOUR BABY IS GETTING ENOUGH BREAST MILK. Wondering whether or not your baby is getting enough milk is a common concern among mothers. You can be assured that your baby is getting enough milk if:   Your baby is actively sucking and you hear swallowing.   Your baby seems relaxed and satisfied after a feeding.   Your baby nurses at least 8 12 times in a 24 hour time period. Nurse your baby until he or she unlatches or falls asleep at the first breast (at least 10 20 minutes), then offer the second side.   Your baby is wetting 5 6 disposable diapers (6 8 cloth diapers) in a 24 hour period by 5 6 days of age.   Your baby is having at least 3 4 stools every 24 hours for the first 6 weeks. The stool should be soft and yellow.   Your baby should gain 4 7 ounces per week after he or she is 4 days old.   Your breasts feel softer after nursing.  REDUCING BREAST ENGORGEMENT  In the first week after your baby is born, you may experience signs of breast engorgement. When breasts are engorged, they feel heavy, warm, full, and may be tender to the touch. You can reduce  engorgement if you:   Nurse frequently, every 2 3 hours. Mothers who breastfeed early and often have fewer problems with engorgement.   Place light ice packs on your breasts for 10 20 minutes between feedings. This reduces swelling. Wrap the ice packs in a lightweight towel to protect your skin. Bags of frozen vegetables work well for this purpose.   Take a warm shower or apply warm, moist heat to your breast for 5 10 minutes just before each feeding. This increases circulation and helps the milk flow.   Gently massage your breast before and during the feeding. Using your finger tips, massage from the chest wall towards your nipple in a circular motion.   Make sure that the baby empties at least one breast at every feeding before switching sides.   Use a breast pump to empty the breasts if your baby is sleepy or not nursing well. You may also want to pump if you are returning to work oryou feel you are getting engorged.   Avoid bottle feeds, pacifiers, or supplemental feedings of water or juice in place of breastfeeding. Breast milk is all the food your baby needs. It is not necessary for your baby to have water or formula. In fact, to help your breasts make more milk, it is best not to give your baby supplemental feedings during the early weeks.   Be sure the baby is latched   on and positioned properly while breastfeeding.   Wear a supportive bra, avoiding underwire styles.   Eat a balanced diet with enough fluids.   Rest often, relax, and take your prenatal vitamins to prevent fatigue, stress, and anemia.  If you follow these suggestions, your engorgement should improve in 24 48 hours. If you are still experiencing difficulty, call your lactation consultant or caregiver.  CARING FOR YOURSELF Take care of your breasts  Bathe or shower daily.   Avoid using soap on your nipples.   Start feedings on your left breast at one feeding and on your right breast at the next  feeding.   You will notice an increase in your milk supply 2 5 days after delivery. You may feel some discomfort from engorgement, which makes your breasts very firm and often tender. Engorgement "peaks" out within 24 48 hours. In the meantime, apply warm moist towels to your breasts for 5 10 minutes before feeding. Gentle massage and expression of some milk before feeding will soften your breasts, making it easier for your baby to latch on.   Wear a well-fitting nursing bra, and air dry your nipples for a 3 4minutes after each feeding.   Only use cotton bra pads.   Only use pure lanolin on your nipples after nursing. You do not need to wash it off before feeding the baby again. Another option is to express a few drops of breast milk and gently massage it into your nipples.  Take care of yourself  Eat well-balanced meals and nutritious snacks.   Drinking milk, fruit juice, and water to satisfy your thirst (about 8 glasses a day).   Get plenty of rest.  Avoid foods that you notice affect the baby in a bad way.  SEEK MEDICAL CARE IF:   You have difficulty with breastfeeding and need help.   You have a hard, red, sore area on your breast that is accompanied by a fever.   Your baby is too sleepy to eat well or is having trouble sleeping.   Your baby is wetting less than 6 diapers a day, by 5 days of age.   Your baby's skin or white part of his or her eyes is more yellow than it was in the hospital.   You feel depressed.  Document Released: 06/18/2005 Document Revised: 12/18/2011 Document Reviewed: 09/16/2011 [employer] Patient Information 2013 [employer].  

## 2012-09-25 ENCOUNTER — Other Ambulatory Visit (INDEPENDENT_AMBULATORY_CARE_PROVIDER_SITE_OTHER): Payer: Medicaid Other | Admitting: *Deleted

## 2012-09-25 DIAGNOSIS — O10012 Pre-existing essential hypertension complicating pregnancy, second trimester: Secondary | ICD-10-CM

## 2012-09-25 DIAGNOSIS — O99331 Smoking (tobacco) complicating pregnancy, first trimester: Secondary | ICD-10-CM

## 2012-09-25 DIAGNOSIS — Z3480 Encounter for supervision of other normal pregnancy, unspecified trimester: Secondary | ICD-10-CM

## 2012-09-25 DIAGNOSIS — Z3482 Encounter for supervision of other normal pregnancy, second trimester: Secondary | ICD-10-CM

## 2012-09-25 DIAGNOSIS — O99334 Smoking (tobacco) complicating childbirth: Secondary | ICD-10-CM

## 2012-09-25 DIAGNOSIS — O1002 Pre-existing essential hypertension complicating childbirth: Secondary | ICD-10-CM

## 2012-09-25 DIAGNOSIS — O09299 Supervision of pregnancy with other poor reproductive or obstetric history, unspecified trimester: Secondary | ICD-10-CM

## 2012-09-25 NOTE — Progress Notes (Signed)
Patient is here for her AFP testing in 2nd trimester.

## 2012-09-26 LAB — ALPHA FETOPROTEIN, MATERNAL
AFP: 22.2 IU/mL
MoM for AFP: 0.72
Open Spina bifida: NEGATIVE
Osb Risk: <1:27300 {titer}

## 2012-09-29 ENCOUNTER — Encounter: Payer: Self-pay | Admitting: Family Medicine

## 2012-10-08 ENCOUNTER — Ambulatory Visit (HOSPITAL_COMMUNITY)
Admission: RE | Admit: 2012-10-08 | Discharge: 2012-10-08 | Disposition: A | Discharge status: Home / Self Care | Payer: Medicaid Other | Source: Ambulatory Visit | Attending: Family Medicine | Admitting: Family Medicine

## 2012-10-08 DIAGNOSIS — O10019 Pre-existing essential hypertension complicating pregnancy, unspecified trimester: Secondary | ICD-10-CM | POA: Insufficient documentation

## 2012-10-08 DIAGNOSIS — Z363 Encounter for antenatal screening for malformations: Secondary | ICD-10-CM | POA: Insufficient documentation

## 2012-10-08 DIAGNOSIS — O99330 Smoking (tobacco) complicating pregnancy, unspecified trimester: Secondary | ICD-10-CM | POA: Insufficient documentation

## 2012-10-08 DIAGNOSIS — Z3482 Encounter for supervision of other normal pregnancy, second trimester: Secondary | ICD-10-CM

## 2012-10-08 DIAGNOSIS — Z1389 Encounter for screening for other disorder: Secondary | ICD-10-CM | POA: Insufficient documentation

## 2012-10-08 DIAGNOSIS — O358XX0 Maternal care for other (suspected) fetal abnormality and damage, not applicable or unspecified: Secondary | ICD-10-CM | POA: Insufficient documentation

## 2012-10-08 DIAGNOSIS — O09299 Supervision of pregnancy with other poor reproductive or obstetric history, unspecified trimester: Secondary | ICD-10-CM

## 2012-10-08 IMAGING — US US OB DETAIL+14 WK
3 series · 12 of 28 positions shown · non-contrast
Comparison: none

[Series 1: us ob detail +14 wk · 1 of 8 slices shown (1 of 3)]
[im 4/8]
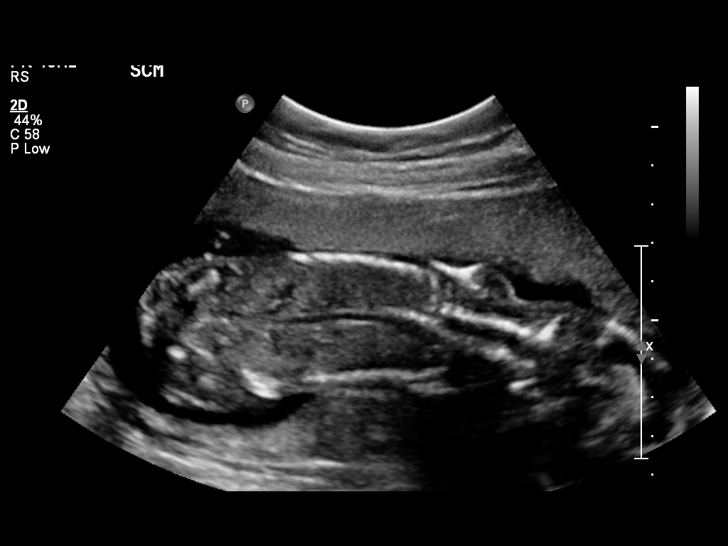

[Series 1: us ob detail +14 wk · 60 acquisitions, 9 frames shown (2 of 3)]
[im 1/60]
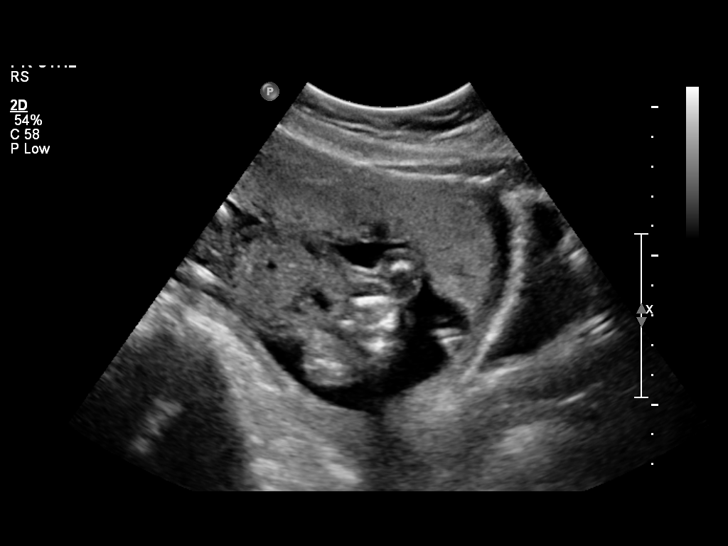
[im 6/60]
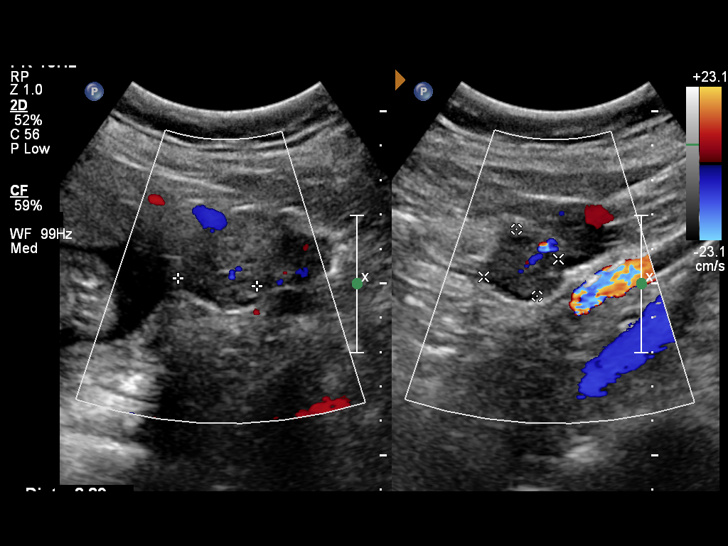
[im 15/60]
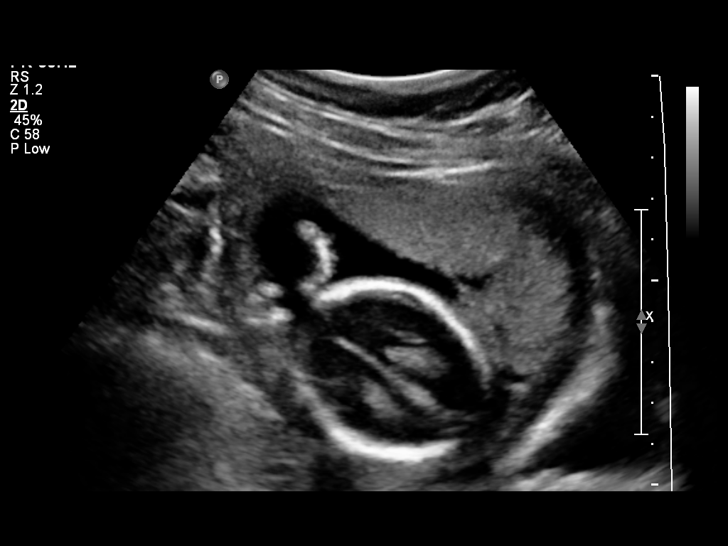
[im 21/60]
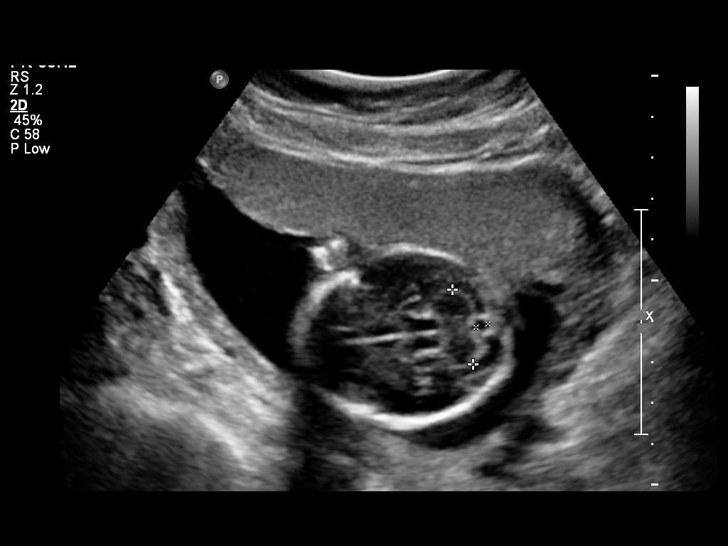
[im 27/60]
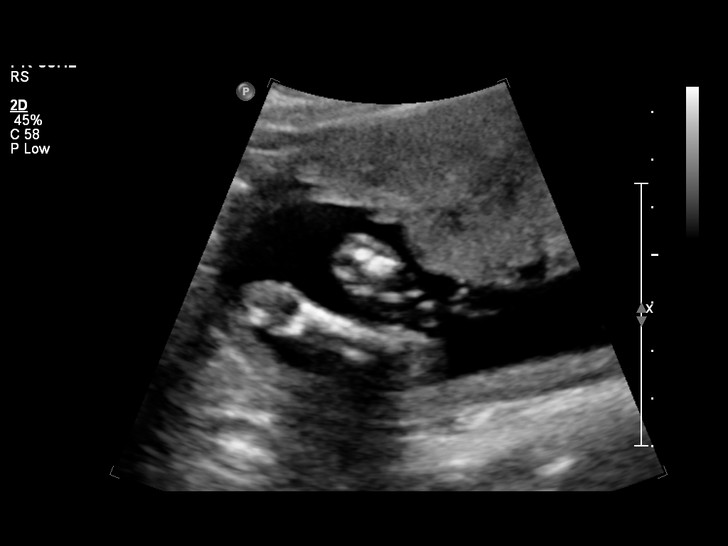
[im 36/60]
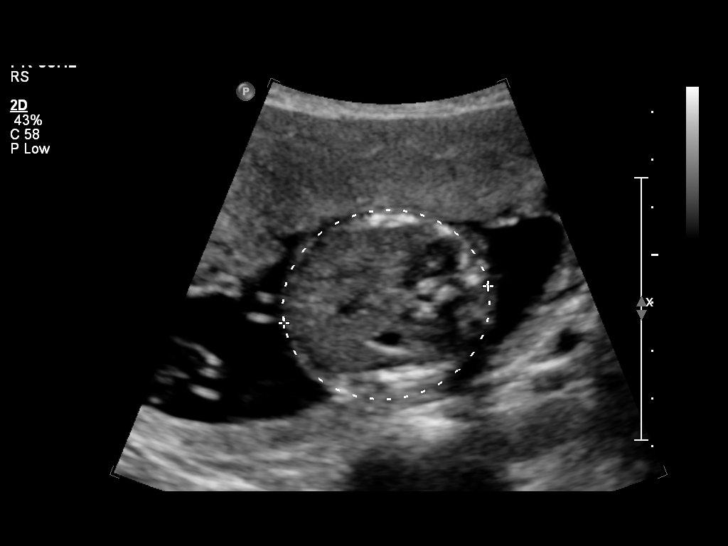
[im 42/60]
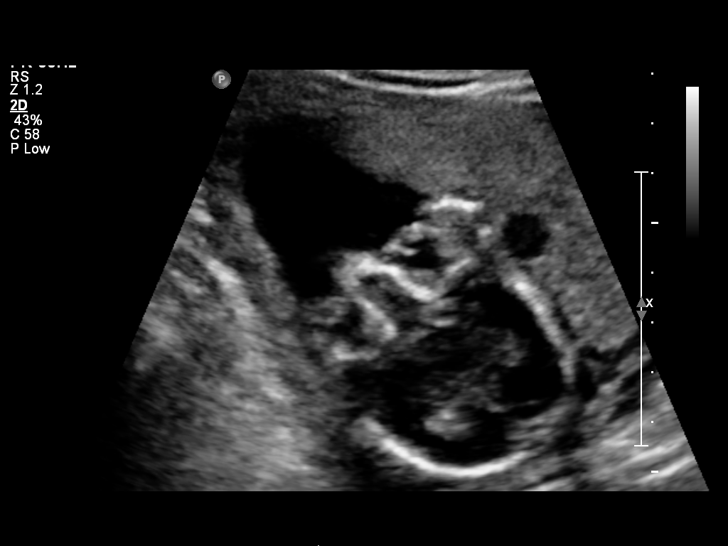
[im 48/60]
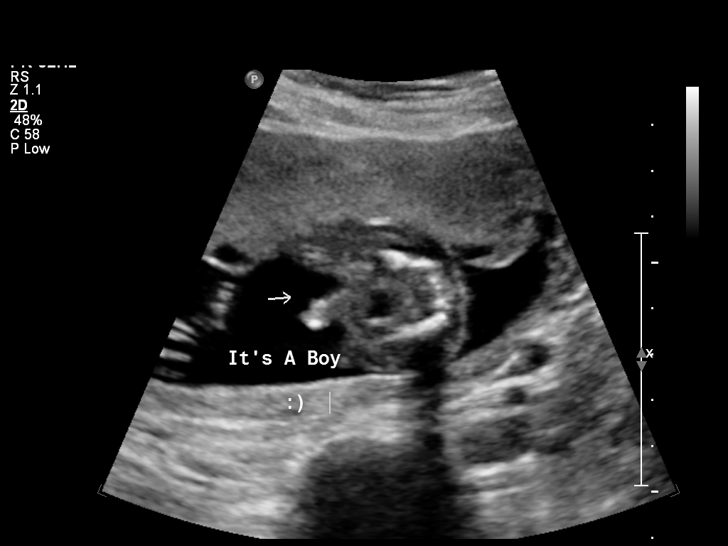
[im 57/60]
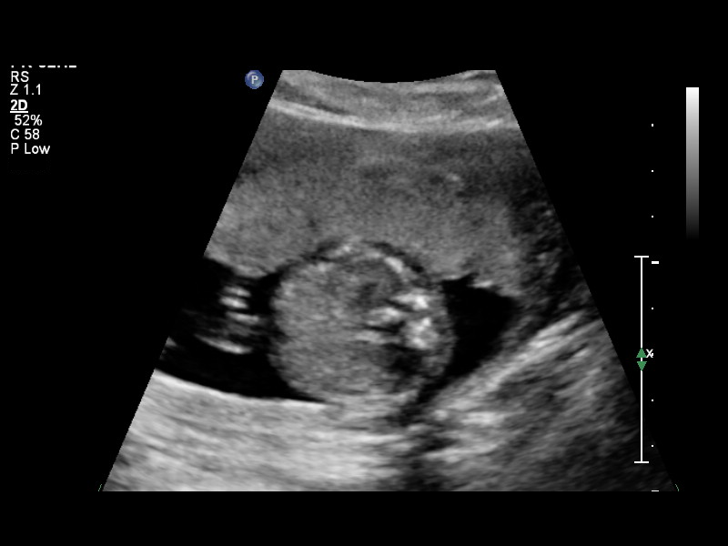

[Series 1: us ob detail +14 wk · 2 of 12 slices shown (3 of 3)]
[im 1/12]
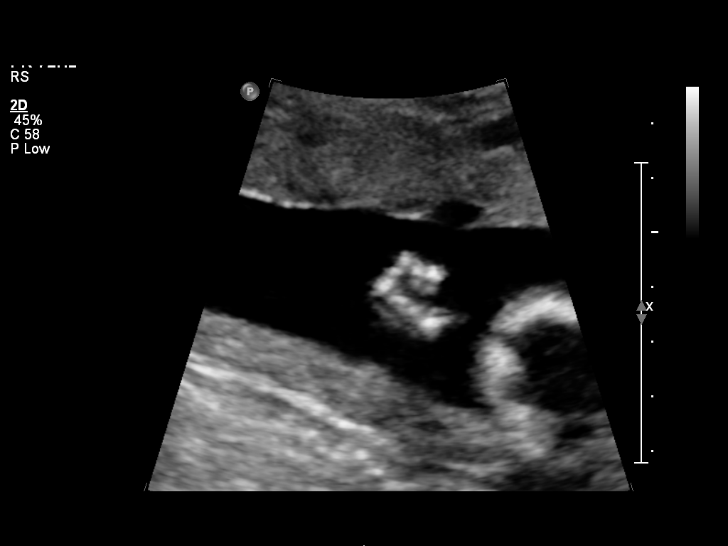
[im 8/12]
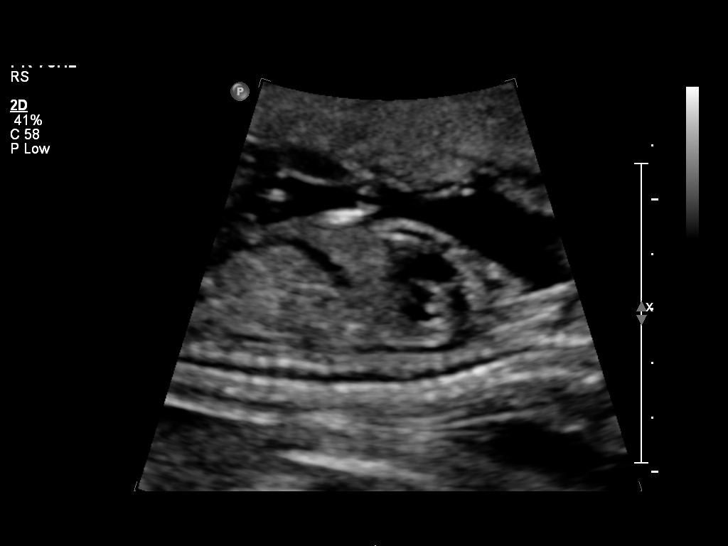

[12 of 28 positions shown; findings below may reference images not displayed]

OBSTETRICS REPORT
                      (Signed Final [DATE] [DATE])

Service(s) Provided

 US OB DETAIL + 14 WK                                  76811.0
Indications

 Detailed fetal anatomic survey                        655.83 [DZ]
 Hypertension - Chronic/Pre-existing
 Previous spontaneous abortion
 Cigarette smoker

Fetal Evaluation

 Num Of Fetuses:    1
 Fetal Heart Rate:  138                         bpm
 Cardiac Activity:  Observed
 Presentation:      Cephalic
 Placenta:          Anterior, above cervical os
 P. Cord            Visualized, central
 Insertion:

 Amniotic Fluid
 AFI FV:      Subjectively within normal limits
                                             Larg Pckt:   4.32   cm
 RLQ:   4.32   cm
Biometry

 BPD:     42.7  mm    G. Age:   18w 6d                CI:         73.3   70 - 86
                                                      FL/HC:      17.2   15.8 -
                                                                         18
 HC:     158.5  mm    G. Age:   18w 5d       63  %    HC/AC:      1.21   1.07 -

 AC:     131.4  mm    G. Age:   18w 4d       59  %    FL/BPD:
 FL:      27.2  mm    G. Age:   18w 2d       45  %    FL/AC:      20.7   20 - 24
 HUM:     25.7  mm    G. Age:   18w 0d       48  %
 NFT:     2.79  mm

 Est. FW:     244  gm      0 lb 9 oz     52  %
Gestational Age

 LMP:           19w 0d       Date:   [DATE]                 EDD:   [DATE]
 U/S Today:     18w 4d                                        EDD:   [DATE]
 Best:          18w 2d    Det. By:   U/S C R L   ([DATE])   EDD:   [DATE]
2nd Trimester Genetic Sonogram - Trisomy 21 Screening

 Age:                                             31          Risk=1:   610

 Structural anomalies (inc. cardiac):             No
 Echogenic bowel:                                 No
 Hypoplastic / absent midphalanx 5th Digit:       No
 Wide space [DZ] toes:                         N/A
 Pyelectasis:                                     No
 2-vessel umbilical cord:                         No
 Echogenic cardiac foci:                          No
Anatomy

 Cranium:          Appears normal         Aortic Arch:      Appears normal
 Fetal Cavum:      Appears normal         Ductal Arch:      Appears normal
 Ventricles:       Appears normal         Diaphragm:        Appears normal
 Choroid Plexus:   Appears normal         Stomach:          Appears normal, left
                                                            sided
 Cerebellum:       Appears normal         Abdomen:          Appears normal
 Posterior Fossa:  Appears normal         Abdominal Wall:   Appears nml (cord
                                                            insert, abd wall)
 Nuchal Fold:      Appears normal         Cord Vessels:     Appears normal (3
                                                            vessel cord)
 Face:             Appears normal         Kidneys:          Appear normal
                   (orbits and profile)
 Lips:             Appears normal         Bladder:          Appears normal
 Heart:            Appears normal         Spine:            Appears normal
                   (4CH, axis, and
                   situs)
 RVOT:             Appears normal         Lower             Appears normal
                                          Extremities:
 LVOT:             Appears normal         Upper             Appears normal
                                          Extremities:

 Other:  Male gender. Heels and 5th digit visualized.
Cervix Uterus Adnexa

 Cervical Length:   3.2       cm

 Cervix:       Normal appearance by transabdominal scan.
 Uterus:       No abnormality visualized.

 Left Ovary:   No adnexal mass visualized.
 Right Ovary:  No adnexal mass visualized.
 Adnexa:     No abnormality visualized.
Impression

 Single living IUP with assigned GA of 18w 2d. EGA by today's
 ultrasound is concordant with assigned GA.
 No fetal anatomic abnormality is identified.
 Normal amniotic fluid volume and cervical length.

## 2012-10-09 ENCOUNTER — Encounter: Payer: Self-pay | Admitting: Family Medicine

## 2012-10-15 ENCOUNTER — Encounter: Payer: Self-pay | Admitting: Obstetrics & Gynecology

## 2012-10-15 ENCOUNTER — Ambulatory Visit (INDEPENDENT_AMBULATORY_CARE_PROVIDER_SITE_OTHER): Payer: Medicaid Other | Admitting: Obstetrics & Gynecology

## 2012-10-15 VITALS — BP 124/90 | Wt 185.0 lb

## 2012-10-15 DIAGNOSIS — O10012 Pre-existing essential hypertension complicating pregnancy, second trimester: Secondary | ICD-10-CM

## 2012-10-15 DIAGNOSIS — I10 Essential (primary) hypertension: Secondary | ICD-10-CM

## 2012-10-15 DIAGNOSIS — Z3480 Encounter for supervision of other normal pregnancy, unspecified trimester: Secondary | ICD-10-CM

## 2012-10-15 DIAGNOSIS — O10019 Pre-existing essential hypertension complicating pregnancy, unspecified trimester: Secondary | ICD-10-CM

## 2012-10-15 NOTE — Progress Notes (Signed)
Normal anatomy scan, stable BP.  No other complaints or concerns.  Fetal movement and labor precautions reviewed.

## 2012-10-15 NOTE — Patient Instructions (Signed)
Return to clinic for any obstetric concerns or go to [facility] for evaluation  

## 2012-10-20 ENCOUNTER — Encounter: Payer: Self-pay | Admitting: *Deleted

## 2012-10-20 ENCOUNTER — Other Ambulatory Visit: Payer: Medicaid Other | Admitting: *Deleted

## 2012-10-20 NOTE — Progress Notes (Signed)
Patient is here for heart rate check for reassurance.  FHR is 145.  She is otherwise doing well and will keep her next routine ob appointment.

## 2012-11-04 ENCOUNTER — Other Ambulatory Visit (INDEPENDENT_AMBULATORY_CARE_PROVIDER_SITE_OTHER): Payer: Medicaid Other | Admitting: *Deleted

## 2012-11-04 ENCOUNTER — Encounter: Payer: Self-pay | Admitting: *Deleted

## 2012-11-04 VITALS — BP 125/79 | HR 85 | Ht 64.0 in | Wt 188.8 lb

## 2012-11-04 DIAGNOSIS — R350 Frequency of micturition: Secondary | ICD-10-CM

## 2012-11-04 LAB — POCT URINALYSIS DIPSTICK
Bilirubin, UA: NEGATIVE
Blood: NEGATIVE
Nitrite, UA: NEGATIVE
Spec Grav, UA: 1.01
Urobilinogen, UA: NEGATIVE
pH, UA: 6

## 2012-11-04 NOTE — Progress Notes (Signed)
Fetal heart rate check/ urine check/.  FHR 143 bpm.

## 2012-11-07 LAB — CULTURE, OB URINE

## 2012-11-12 ENCOUNTER — Ambulatory Visit (INDEPENDENT_AMBULATORY_CARE_PROVIDER_SITE_OTHER): Payer: Medicaid Other | Admitting: Family Medicine

## 2012-11-12 ENCOUNTER — Encounter: Payer: Self-pay | Admitting: Family Medicine

## 2012-11-12 VITALS — BP 121/77 | Wt 190.0 lb

## 2012-11-12 DIAGNOSIS — O10012 Pre-existing essential hypertension complicating pregnancy, second trimester: Secondary | ICD-10-CM

## 2012-11-12 DIAGNOSIS — Z3480 Encounter for supervision of other normal pregnancy, unspecified trimester: Secondary | ICD-10-CM

## 2012-11-12 DIAGNOSIS — O10019 Pre-existing essential hypertension complicating pregnancy, unspecified trimester: Secondary | ICD-10-CM

## 2012-11-12 NOTE — Patient Instructions (Signed)
Pregnancy - Second Trimester The second trimester of pregnancy (3 to 6 months) is a period of rapid growth for you and your baby. At the end of the sixth month, your baby is about 9 inches long and weighs 1 1/2 pounds. You will begin to feel the baby move between 18 and 20 weeks of the pregnancy. This is called quickening. Weight gain is faster. A clear fluid (colostrum) may leak out of your breasts. You may feel small contractions of the womb (uterus). This is known as false labor or Braxton-Hicks contractions. This is like a practice for labor when the baby is ready to be born. Usually, the problems with morning sickness have usually passed by the end of your first trimester. Some women develop small dark blotches (called cholasma, mask of pregnancy) on their face that usually goes away after the baby is born. Exposure to the sun makes the blotches worse. Acne may also develop in some pregnant women and pregnant women who have acne, may find that it goes away. PRENATAL EXAMS  Blood work may continue to be done during prenatal exams. These tests are done to check on your health and the probable health of your baby. Blood work is used to follow your blood levels (hemoglobin). Anemia (low hemoglobin) is common during pregnancy. Iron and vitamins are given to help prevent this. You will also be checked for diabetes between 24 and 28 weeks of the pregnancy. Some of the previous blood tests may be repeated.  The size of the uterus is measured during each visit. This is to make sure that the baby is continuing to grow properly according to the dates of the pregnancy.  Your blood pressure is checked every prenatal visit. This is to make sure you are not getting toxemia.  Your urine is checked to make sure you do not have an infection, diabetes or protein in the urine.  Your weight is checked often to make sure gains are happening at the suggested rate. This is to ensure that both you and your baby are  growing normally.  Sometimes, an ultrasound is performed to confirm the proper growth and development of the baby. This is a test which bounces harmless sound waves off the baby so your caregiver can more accurately determine due dates. Sometimes, a specialized test is done on the amniotic fluid surrounding the baby. This test is called an amniocentesis. The amniotic fluid is obtained by sticking a needle into the belly (abdomen). This is done to check the chromosomes in instances where there is a concern about possible genetic problems with the baby. It is also sometimes done near the end of pregnancy if an early delivery is required. In this case, it is done to help make sure the baby's lungs are mature enough for the baby to live outside of the womb. CHANGES OCCURING IN THE SECOND TRIMESTER OF PREGNANCY Your body goes through many changes during pregnancy. They vary from person to person. Talk to your caregiver about changes you notice that you are concerned about.  During the second trimester, you will likely have an increase in your appetite. It is normal to have cravings for certain foods. This varies from person to person and pregnancy to pregnancy.  Your lower abdomen will begin to bulge.  You may have to urinate more often because the uterus and baby are pressing on your bladder. It is also common to get more bladder infections during pregnancy (pain with urination). You can help this by   drinking lots of fluids and emptying your bladder before and after intercourse.  You may begin to get stretch marks on your hips, abdomen, and breasts. These are normal changes in the body during pregnancy. There are no exercises or medications to take that prevent this change.  You may begin to develop swollen and bulging veins (varicose veins) in your legs. Wearing support hose, elevating your feet for 15 minutes, 3 to 4 times a day and limiting salt in your diet helps lessen the problem.  Heartburn may  develop as the uterus grows and pushes up against the stomach. Antacids recommended by your caregiver helps with this problem. Also, eating smaller meals 4 to 5 times a day helps.  Constipation can be treated with a stool softener or adding bulk to your diet. Drinking lots of fluids, vegetables, fruits, and whole grains are helpful.  Exercising is also helpful. If you have been very active up until your pregnancy, most of these activities can be continued during your pregnancy. If you have been less active, it is helpful to start an exercise program such as walking.  Hemorrhoids (varicose veins in the rectum) may develop at the end of the second trimester. Warm sitz baths and hemorrhoid cream recommended by your caregiver helps hemorrhoid problems.  Backaches may develop during this time of your pregnancy. Avoid heavy lifting, wear low heal shoes and practice good posture to help with backache problems.  Some pregnant women develop tingling and numbness of their hand and fingers because of swelling and tightening of ligaments in the wrist (carpel tunnel syndrome). This goes away after the baby is born.  As your breasts enlarge, you may have to get a bigger bra. Get a comfortable, cotton, support bra. Do not get a nursing bra until the last month of the pregnancy if you will be nursing the baby.  You may get a dark line from your belly button to the pubic area called the linea nigra.  You may develop rosy cheeks because of increase blood flow to the face.  You may develop spider looking lines of the face, neck, arms and chest. These go away after the baby is born. HOME CARE INSTRUCTIONS   It is extremely important to avoid all smoking, herbs, alcohol, and unprescribed drugs during your pregnancy. These chemicals affect the formation and growth of the baby. Avoid these chemicals throughout the pregnancy to ensure the delivery of a healthy infant.  Most of your home care instructions are the same  as suggested for the first trimester of your pregnancy. Keep your caregiver's appointments. Follow your caregiver's instructions regarding medication use, exercise and diet.  During pregnancy, you are providing food for you and your baby. Continue to eat regular, well-balanced meals. Choose foods such as meat, fish, milk and other low fat dairy products, vegetables, fruits, and whole-grain breads and cereals. Your caregiver will tell you of the ideal weight gain.  A physical sexual relationship may be continued up until near the end of pregnancy if there are no other problems. Problems could include early (premature) leaking of amniotic fluid from the membranes, vaginal bleeding, abdominal pain, or other medical or pregnancy problems.  Exercise regularly if there are no restrictions. Check with your caregiver if you are unsure of the safety of some of your exercises. The greatest weight gain will occur in the last 2 trimesters of pregnancy. Exercise will help you:  Control your weight.  Get you in shape for labor and delivery.  Lose weight   after you have the baby.  Wear a good support or jogging bra for breast tenderness during pregnancy. This may help if worn during sleep. Pads or tissues may be used in the bra if you are leaking colostrum.  Do not use hot tubs, steam rooms or saunas throughout the pregnancy.  Wear your seat belt at all times when driving. This protects you and your baby if you are in an accident.  Avoid raw meat, uncooked cheese, cat litter boxes and soil used by cats. These carry germs that can cause birth defects in the baby.  The second trimester is also a good time to visit your dentist for your dental health if this has not been done yet. Getting your teeth cleaned is OK. Use a soft toothbrush. Brush gently during pregnancy.  It is easier to loose urine during pregnancy. Tightening up and strengthening the pelvic muscles will help with this problem. Practice stopping  your urination while you are going to the bathroom. These are the same muscles you need to strengthen. It is also the muscles you would use as if you were trying to stop from passing gas. You can practice tightening these muscles up 10 times a set and repeating this about 3 times per day. Once you know what muscles to tighten up, do not perform these exercises during urination. It is more likely to contribute to an infection by backing up the urine.  Ask for help if you have financial, counseling or nutritional needs during pregnancy. Your caregiver will be able to offer counseling for these needs as well as refer you for other special needs.  Your skin may become oily. If so, wash your face with mild soap, use non-greasy moisturizer and oil or cream based makeup. MEDICATIONS AND DRUG USE IN PREGNANCY  Take prenatal vitamins as directed. The vitamin should contain 1 milligram of folic acid. Keep all vitamins out of reach of children. Only a couple vitamins or tablets containing iron may be fatal to a baby or young child when ingested.  Avoid use of all medications, including herbs, over-the-counter medications, not prescribed or suggested by your caregiver. Only take over-the-counter or prescription medicines for pain, discomfort, or fever as directed by your caregiver. Do not use aspirin.  Let your caregiver also know about herbs you may be using.  Alcohol is related to a number of birth defects. This includes fetal alcohol syndrome. All alcohol, in any form, should be avoided completely. Smoking will cause low birth rate and premature babies.  Street or illegal drugs are very harmful to the baby. They are absolutely forbidden. A baby born to an addicted mother will be addicted at birth. The baby will go through the same withdrawal an adult does. SEEK MEDICAL CARE IF:  You have any concerns or worries during your pregnancy. It is better to call with your questions if you feel they cannot wait,  rather than worry about them. SEEK IMMEDIATE MEDICAL CARE IF:   An unexplained oral temperature above 102 F (38.9 C) develops, or as your caregiver suggests.  You have leaking of fluid from the vagina (birth canal). If leaking membranes are suspected, take your temperature and tell your caregiver of this when you call.  There is vaginal spotting, bleeding, or passing clots. Tell your caregiver of the amount and how many pads are used. Light spotting in pregnancy is common, especially following intercourse.  You develop a bad smelling vaginal discharge with a change in the color from clear   to white.  You continue to feel sick to your stomach (nauseated) and have no relief from remedies suggested. You vomit blood or coffee ground-like materials.  You lose more than 2 pounds of weight or gain more than 2 pounds of weight over 1 week, or as suggested by your caregiver.  You notice swelling of your face, hands, feet, or legs.  You get exposed to German measles and have never had them.  You are exposed to fifth disease or chickenpox.  You develop belly (abdominal) pain. Round ligament discomfort is a common non-cancerous (benign) cause of abdominal pain in pregnancy. Your caregiver still must evaluate you.  You develop a bad headache that does not go away.  You develop fever, diarrhea, pain with urination, or shortness of breath.  You develop visual problems, blurry, or double vision.  You fall or are in a car accident or any kind of trauma.  There is mental or physical violence at home. Document Released: 06/12/2001 Document Revised: 09/10/2011 Document Reviewed: 12/15/2008 [employer] Patient Information 2013 [employer].  Breastfeeding Deciding to breastfeed is one of the best choices you can make for you and your baby. The information that follows gives a brief overview of the benefits of breastfeeding as well as common topics surrounding breastfeeding. BENEFITS OF  BREASTFEEDING For the baby  The first milk (colostrum) helps the baby's digestive system function better.   There are antibodies in the mother's milk that help the baby fight off infections.   The baby has a lower incidence of asthma, allergies, and sudden infant death syndrome (SIDS).   The nutrients in breast milk are better for the baby than infant formulas, and breast milk helps the baby's brain grow better.   Babies who breastfeed have less gas, colic, and constipation.  For the mother  Breastfeeding helps develop a very special bond between the mother and her baby.   Breastfeeding is convenient, always available at the correct temperature, and costs nothing.   Breastfeeding burns calories in the mother and helps her lose weight that was gained during pregnancy.   Breastfeeding makes the uterus contract back down to normal size faster and slows bleeding following delivery.   Breastfeeding mothers have a lower risk of developing breast cancer.  BREASTFEEDING FREQUENCY  A healthy, full-term baby may breastfeed as often as every hour or space his or her feedings to every 3 hours.   Watch your baby for signs of hunger. Nurse your baby if he or she shows signs of hunger. How often you nurse will vary from baby to baby.   Nurse as often as the baby requests, or when you feel the need to reduce the fullness of your breasts.   Awaken the baby if it has been 3 4 hours since the last feeding.   Frequent feeding will help the mother make more milk and will help prevent problems, such as sore nipples and engorgement of the breasts.  BABY'S POSITION AT THE BREAST  Whether lying down or sitting, be sure that the baby's tummy is facing your tummy.   Support the breast with 4 fingers underneath the breast and the thumb above. Make sure your fingers are well away from the nipple and baby's mouth.   Stroke the baby's lips gently with your finger or nipple.   When the  baby's mouth is open wide enough, place all of your nipple and as much of the areola as possible into your baby's mouth.   Pull the baby in   close so the tip of the nose and the baby's cheeks touch the breast during the feeding.  FEEDINGS AND SUCTION  The length of each feeding varies from baby to baby and from feeding to feeding.   The baby must suck about 2 3 minutes for your milk to get to him or her. This is called a "let down." For this reason, allow the baby to feed on each breast as long as he or she wants. Your baby will end the feeding when he or she has received the right balance of nutrients.   To break the suction, put your finger into the corner of the baby's mouth and slide it between his or her gums before removing your breast from his or her mouth. This will help prevent sore nipples.  HOW TO TELL WHETHER YOUR BABY IS GETTING ENOUGH BREAST MILK. Wondering whether or not your baby is getting enough milk is a common concern among mothers. You can be assured that your baby is getting enough milk if:   Your baby is actively sucking and you hear swallowing.   Your baby seems relaxed and satisfied after a feeding.   Your baby nurses at least 8 12 times in a 24 hour time period. Nurse your baby until he or she unlatches or falls asleep at the first breast (at least 10 20 minutes), then offer the second side.   Your baby is wetting 5 6 disposable diapers (6 8 cloth diapers) in a 24 hour period by 5 6 days of age.   Your baby is having at least 3 4 stools every 24 hours for the first 6 weeks. The stool should be soft and yellow.   Your baby should gain 4 7 ounces per week after he or she is 4 days old.   Your breasts feel softer after nursing.  REDUCING BREAST ENGORGEMENT  In the first week after your baby is born, you may experience signs of breast engorgement. When breasts are engorged, they feel heavy, warm, full, and may be tender to the touch. You can reduce  engorgement if you:   Nurse frequently, every 2 3 hours. Mothers who breastfeed early and often have fewer problems with engorgement.   Place light ice packs on your breasts for 10 20 minutes between feedings. This reduces swelling. Wrap the ice packs in a lightweight towel to protect your skin. Bags of frozen vegetables work well for this purpose.   Take a warm shower or apply warm, moist heat to your breast for 5 10 minutes just before each feeding. This increases circulation and helps the milk flow.   Gently massage your breast before and during the feeding. Using your finger tips, massage from the chest wall towards your nipple in a circular motion.   Make sure that the baby empties at least one breast at every feeding before switching sides.   Use a breast pump to empty the breasts if your baby is sleepy or not nursing well. You may also want to pump if you are returning to work oryou feel you are getting engorged.   Avoid bottle feeds, pacifiers, or supplemental feedings of water or juice in place of breastfeeding. Breast milk is all the food your baby needs. It is not necessary for your baby to have water or formula. In fact, to help your breasts make more milk, it is best not to give your baby supplemental feedings during the early weeks.   Be sure the baby is latched   on and positioned properly while breastfeeding.   Wear a supportive bra, avoiding underwire styles.   Eat a balanced diet with enough fluids.   Rest often, relax, and take your prenatal vitamins to prevent fatigue, stress, and anemia.  If you follow these suggestions, your engorgement should improve in 24 48 hours. If you are still experiencing difficulty, call your lactation consultant or caregiver.  CARING FOR YOURSELF Take care of your breasts  Bathe or shower daily.   Avoid using soap on your nipples.   Start feedings on your left breast at one feeding and on your right breast at the next  feeding.   You will notice an increase in your milk supply 2 5 days after delivery. You may feel some discomfort from engorgement, which makes your breasts very firm and often tender. Engorgement "peaks" out within 24 48 hours. In the meantime, apply warm moist towels to your breasts for 5 10 minutes before feeding. Gentle massage and expression of some milk before feeding will soften your breasts, making it easier for your baby to latch on.   Wear a well-fitting nursing bra, and air dry your nipples for a 3 4minutes after each feeding.   Only use cotton bra pads.   Only use pure lanolin on your nipples after nursing. You do not need to wash it off before feeding the baby again. Another option is to express a few drops of breast milk and gently massage it into your nipples.  Take care of yourself  Eat well-balanced meals and nutritious snacks.   Drinking milk, fruit juice, and water to satisfy your thirst (about 8 glasses a day).   Get plenty of rest.  Avoid foods that you notice affect the baby in a bad way.  SEEK MEDICAL CARE IF:   You have difficulty with breastfeeding and need help.   You have a hard, red, sore area on your breast that is accompanied by a fever.   Your baby is too sleepy to eat well or is having trouble sleeping.   Your baby is wetting less than 6 diapers a day, by 5 days of age.   Your baby's skin or white part of his or her eyes is more yellow than it was in the hospital.   You feel depressed.  Document Released: 06/18/2005 Document Revised: 12/18/2011 Document Reviewed: 09/16/2011 [employer] Patient Information 2013 [employer].  

## 2012-11-12 NOTE — Progress Notes (Signed)
Feels well.  Stable BP. U/S for growth at 28 wks.

## 2012-11-20 ENCOUNTER — Inpatient Hospital Stay (HOSPITAL_COMMUNITY)
Admission: AD | Admit: 2012-11-20 | Discharge: 2012-11-20 | Disposition: A | Discharge status: Home / Self Care | Payer: Medicaid Other | Source: Ambulatory Visit | Attending: Obstetrics & Gynecology | Admitting: Obstetrics & Gynecology

## 2012-11-20 ENCOUNTER — Encounter (HOSPITAL_COMMUNITY): Payer: Self-pay | Admitting: *Deleted

## 2012-11-20 DIAGNOSIS — O99333 Smoking (tobacco) complicating pregnancy, third trimester: Secondary | ICD-10-CM

## 2012-11-20 DIAGNOSIS — R109 Unspecified abdominal pain: Secondary | ICD-10-CM | POA: Insufficient documentation

## 2012-11-20 DIAGNOSIS — O368190 Decreased fetal movements, unspecified trimester, not applicable or unspecified: Secondary | ICD-10-CM | POA: Insufficient documentation

## 2012-11-20 DIAGNOSIS — O368120 Decreased fetal movements, second trimester, not applicable or unspecified: Secondary | ICD-10-CM

## 2012-11-20 DIAGNOSIS — O1002 Pre-existing essential hypertension complicating childbirth: Secondary | ICD-10-CM

## 2012-11-20 DIAGNOSIS — O10012 Pre-existing essential hypertension complicating pregnancy, second trimester: Secondary | ICD-10-CM

## 2012-11-20 DIAGNOSIS — O4700 False labor before 37 completed weeks of gestation, unspecified trimester: Secondary | ICD-10-CM | POA: Insufficient documentation

## 2012-11-20 LAB — URINALYSIS, ROUTINE W REFLEX MICROSCOPIC
Hgb urine dipstick: NEGATIVE
Leukocytes, UA: NEGATIVE
Protein, ur: NEGATIVE mg/dL
Specific Gravity, Urine: 1.01 (ref 1.005–1.030)
Urobilinogen, UA: 0.2 mg/dL (ref 0.0–1.0)

## 2012-11-20 NOTE — MAU Note (Signed)
Pt states she hadnt felt baby move for  The last couple of days

## 2012-11-20 NOTE — MAU Note (Signed)
Patient states she has not felt fetal movement since last night. States she has been having lower abdominal pain and pressure for a couple of days. Fetal heart tones in triage in the 130's with audible movement.

## 2012-11-20 NOTE — MAU Provider Note (Signed)
History     CSN: [id number]  Arrival date and time: 11/20/12 1524   None     Chief Complaint  Patient presents with  . Decreased Fetal Movement  . Abdominal Pain   HPI This is a 32 y.o. female at [redacted]w[redacted]d who presents with c/o decreased fetal movement since yesterday. Feels movement now. Has had some heaviness and pressure but no distinct contractions. Denies leaking or bleeding.  RN Note: Patient states she has not felt fetal movement since last night. States she has been having lower abdominal pain and pressure for a couple of days. Fetal heart tones in triage in the 130's with audible movement  OB History   Grav Para Term Preterm Abortions TAB SAB Ect Mult Living   6 2 2  0 3 2 1  0 0 2      Past Medical History  Diagnosis Date  . Hypertension   . Gestational diabetes   . Migraine     Past Surgical History  Procedure Laterality Date  . Tonsillectomy  1988  . Tubes in ears  Baby  . Dilation and evacuation  03/09/2011    Procedure: DILATATION AND EVACUATION (D&E);  Surgeon: Reva Bores, MD;  Location: [facility];  Service: Gynecology;  Laterality: N/A;    Family History  Problem Relation Age of Onset  . Diabetes Mother   . Osteoarthritis Mother   . Hypertension Mother   . Hypothyroidism Mother   . Heart disease Mother   . Hyperlipidemia Mother   . Migraines Mother   . Mental illness Mother     Depression  . Hypertension Father   . Heart failure Father   . Hepatitis Father   . Cirrhosis Father   . Hyperlipidemia Father   . Migraines Father   . Mental illness Father     Depression  . Breast cancer Maternal Aunt 70  . Cancer Sister 65    Cervical  . Heart disease Sister     Heart Stops  . Migraines Sister   . Seizures Sister 11    unsure if this or heart problem  . Mental illness Sister     depression  . Heart disease Maternal Grandmother     History  Substance Use Topics  . Smoking status: Current Some Day Smoker -- 0.50 packs/day for 12 years   Types: Cigarettes  . Smokeless tobacco: Never Used  . Alcohol Use: No    Allergies: No Known Allergies  Prescriptions prior to admission  Medication Sig Dispense Refill  . acetaminophen (TYLENOL) 325 MG tablet Take 650 mg by mouth every 6 (six) hours as needed for pain.      Marland Kitchen atenolol (TENORMIN) 50 MG tablet Take 50 mg by mouth daily.      . flintstones complete (FLINTSTONES) 60 MG chewable tablet Chew 2 tablets by mouth daily.         Review of Systems  Constitutional: Negative for fever, chills and malaise/fatigue.  Gastrointestinal: Negative for nausea, vomiting, abdominal pain (but feels pressure), diarrhea and constipation.  Genitourinary: Negative for dysuria.  Neurological: Negative for dizziness and headaches.   Physical Exam   Blood pressure 132/72, pulse 97, temperature 98.3 F (36.8 C), temperature source Oral, resp. rate 16, height 5\' 4"  (1.626 m), weight 87 kg (191 lb 12.8 oz), last menstrual period 05/28/2012, SpO2 97.00%.  Physical Exam  Constitutional: She is oriented to person, place, and time. She appears well-developed and well-nourished. No distress.  HENT:  Head: Normocephalic.  Cardiovascular:  Normal rate.   Respiratory: Effort normal.  GI: Soft. She exhibits no distension and no mass. There is no tenderness. There is no rebound and no guarding.  Genitourinary: Vagina normal and uterus normal. No vaginal discharge found.  Musculoskeletal: Normal range of motion. She exhibits no edema.  Neurological: She is alert and oriented to person, place, and time.  Skin: Skin is warm and dry.  Psychiatric: She has a normal mood and affect.   Fetal heart rate reassuring No contractions visible on monitor Results for orders placed during the hospital encounter of 11/20/12 (from the past 72 hour(s))  URINALYSIS, ROUTINE W REFLEX MICROSCOPIC     Status: None   Collection Time    11/20/12  3:35 PM      Result Value Range   Color, Urine YELLOW  YELLOW   APPearance  CLEAR  CLEAR   Specific Gravity, Urine 1.010  1.005 - 1.030   pH 6.0  5.0 - 8.0   Glucose, UA NEGATIVE  NEGATIVE mg/dL   Hgb urine dipstick NEGATIVE  NEGATIVE   Bilirubin Urine NEGATIVE  NEGATIVE   Ketones, ur NEGATIVE  NEGATIVE mg/dL   Protein, ur NEGATIVE  NEGATIVE mg/dL   Urobilinogen, UA 0.2  0.0 - 1.0 mg/dL   Nitrite NEGATIVE  NEGATIVE   Leukocytes, UA NEGATIVE  NEGATIVE   Comment: MICROSCOPIC NOT DONE ON URINES WITH NEGATIVE PROTEIN, BLOOD, LEUKOCYTES, NITRITE, OR GLUCOSE <1000 mg/dL.   Dilation: Closed Effacement (%): Thick Cervical Position: Posterior Station: -3 Exam by::  (M.Cohan Stipes CNM)  MAU Course  Procedures   Assessment and Plan  A:  SIUP at [redacted]w[redacted]d       Reassuring fetal heart rate     No evidence of preterm labor  P:   Reassured patient        Monitor for PTL and FM        Followup in clinic  [facility] 11/20/2012, 4:59 PM

## 2012-11-21 NOTE — MAU Provider Note (Signed)
Attestation of Attending Supervision of Advanced Practitioner (PA/CNM/NP): Evaluation and management procedures were performed by the Advanced Practitioner under my supervision and collaboration.  I have reviewed the Advanced Practitioner's note and chart, and I agree with the management and plan.  Neah Sporrer, MD, FACOG Attending Obstetrician & Gynecologist Faculty Practice, [facility]  

## 2012-12-09 ENCOUNTER — Encounter: Payer: Self-pay | Admitting: Family Medicine

## 2012-12-09 ENCOUNTER — Ambulatory Visit (INDEPENDENT_AMBULATORY_CARE_PROVIDER_SITE_OTHER): Payer: Medicaid Other | Admitting: Family Medicine

## 2012-12-09 VITALS — BP 113/68 | Wt 194.0 lb

## 2012-12-09 DIAGNOSIS — Z23 Encounter for immunization: Secondary | ICD-10-CM

## 2012-12-09 DIAGNOSIS — O10012 Pre-existing essential hypertension complicating pregnancy, second trimester: Secondary | ICD-10-CM

## 2012-12-09 DIAGNOSIS — O10019 Pre-existing essential hypertension complicating pregnancy, unspecified trimester: Secondary | ICD-10-CM

## 2012-12-09 DIAGNOSIS — Z3480 Encounter for supervision of other normal pregnancy, unspecified trimester: Secondary | ICD-10-CM

## 2012-12-09 LAB — CBC
MCV: 86.7 fL (ref 78.0–100.0)
Platelets: 277 10*3/uL (ref 150–400)
RBC: 3.6 MIL/uL — ABNORMAL LOW (ref 3.87–5.11)
RDW: 13.9 % (ref 11.5–15.5)
WBC: 11.5 10*3/uL — ABNORMAL HIGH (ref 4.0–10.5)

## 2012-12-09 MED ORDER — TETANUS-DIPHTH-ACELL PERTUSSIS 5-2.5-18.5 LF-MCG/0.5 IM SUSP: 0.5000 mL | Freq: Once | INTRAMUSCULAR | Status: DC

## 2012-12-09 NOTE — Patient Instructions (Signed)
Pregnancy - Third Trimester The third trimester of pregnancy (the last 3 months) is a period of the most rapid growth for you and your baby. The baby approaches a length of 20 inches and a weight of 6 to 10 pounds. The baby is adding on fat and getting ready for life outside your body. While inside, babies have periods of sleeping and waking, sucking thumbs, and hiccuping. You can often feel small contractions of the uterus. This is false labor. It is also called Braxton-Hicks contractions. This is like a practice for labor. The usual problems in this stage of pregnancy include more difficulty breathing, swelling of the hands and feet from water retention, and having to urinate more often because of the uterus and baby pressing on your bladder.  PRENATAL EXAMS  Blood work may continue to be done during prenatal exams. These tests are done to check on your health and the probable health of your baby. Blood work is used to follow your blood levels (hemoglobin). Anemia (low hemoglobin) is common during pregnancy. Iron and vitamins are given to help prevent this. You may also continue to be checked for diabetes. Some of the past blood tests may be done again.  The size of the uterus is measured during each visit. This makes sure your baby is growing properly according to your pregnancy dates.  Your blood pressure is checked every prenatal visit. This is to make sure you are not getting toxemia.  Your urine is checked every prenatal visit for infection, diabetes, and protein.  Your weight is checked at each visit. This is done to make sure gains are happening at the suggested rate and that you and your baby are growing normally.  Sometimes, an ultrasound is performed to confirm the position and the proper growth and development of the baby. This is a test done that bounces harmless sound waves off the baby so your caregiver can more accurately determine a due date.  Discuss the type of pain medicine and  anesthesia you will have during your labor and delivery.  Discuss the possibility and anesthesia if a cesarean section might be necessary.  Inform your caregiver if there is any mental or physical violence at home. Sometimes, a specialized non-stress test, contraction stress test, and biophysical profile are done to make sure the baby is not having a problem. Checking the amniotic fluid surrounding the baby is called an amniocentesis. The amniotic fluid is removed by sticking a needle into the belly (abdomen). This is sometimes done near the end of pregnancy if an early delivery is required. In this case, it is done to help make sure the baby's lungs are mature enough for the baby to live outside of the womb. If the lungs are not mature and it is unsafe to deliver the baby, an injection of cortisone medicine is given to the mother 1 to 2 days before the delivery. This helps the baby's lungs mature and makes it safer to deliver the baby. CHANGES OCCURING IN THE THIRD TRIMESTER OF PREGNANCY Your body goes through many changes during pregnancy. They vary from person to person. Talk to your caregiver about changes you notice and are concerned about.  During the last trimester, you have probably had an increase in your appetite. It is normal to have cravings for certain foods. This varies from person to person and pregnancy to pregnancy.  You may begin to get stretch marks on your hips, abdomen, and breasts. These are normal changes in the body   during pregnancy. There are no exercises or medicines to take which prevent this change.  Constipation may be treated with a stool softener or adding bulk to your diet. Drinking lots of fluids, fiber in vegetables, fruits, and whole grains are helpful.  Exercising is also helpful. If you have been very active up until your pregnancy, most of these activities can be continued during your pregnancy. If you have been less active, it is helpful to start an exercise  program such as walking. Consult your caregiver before starting exercise programs.  Avoid all smoking, alcohol, non-prescribed drugs, herbs and "street drugs" during your pregnancy. These chemicals affect the formation and growth of the baby. Avoid chemicals throughout the pregnancy to ensure the delivery of a healthy infant.  Backache, varicose veins, and hemorrhoids may develop or get worse.  You will tire more easily in the third trimester, which is normal.  The baby's movements may be stronger and more often.  You may become short of breath easily.  Your belly button may stick out.  A yellow discharge may leak from your breasts called colostrum.  You may have a bloody mucus discharge. This usually occurs a few days to a week before labor begins. HOME CARE INSTRUCTIONS   Keep your caregiver's appointments. Follow your caregiver's instructions regarding medicine use, exercise, and diet.  During pregnancy, you are providing food for you and your baby. Continue to eat regular, well-balanced meals. Choose foods such as meat, fish, milk and other low fat dairy products, vegetables, fruits, and whole-grain breads and cereals. Your caregiver will tell you of the ideal weight gain.  A physical sexual relationship may be continued throughout pregnancy if there are no other problems such as early (premature) leaking of amniotic fluid from the membranes, vaginal bleeding, or belly (abdominal) pain.  Exercise regularly if there are no restrictions. Check with your caregiver if you are unsure of the safety of your exercises. Greater weight gain will occur in the last 2 trimesters of pregnancy. Exercising helps:  Control your weight.  Get you in shape for labor and delivery.  You lose weight after you deliver.  Rest a lot with legs elevated, or as needed for leg cramps or low back pain.  Wear a good support or jogging bra for breast tenderness during pregnancy. This may help if worn during  sleep. Pads or tissues may be used in the bra if you are leaking colostrum.  Do not use hot tubs, steam rooms, or saunas.  Wear your seat belt when driving. This protects you and your baby if you are in an accident.  Avoid raw meat, cat litter boxes and soil used by cats. These carry germs that can cause birth defects in the baby.  It is easier to leak urine during pregnancy. Tightening up and strengthening the pelvic muscles will help with this problem. You can practice stopping your urination while you are going to the bathroom. These are the same muscles you need to strengthen. It is also the muscles you would use if you were trying to stop from passing gas. You can practice tightening these muscles up 10 times a set and repeating this about 3 times per day. Once you know what muscles to tighten up, do not perform these exercises during urination. It is more likely to cause an infection by backing up the urine.  Ask for help if you have financial, counseling, or nutritional needs during pregnancy. Your caregiver will be able to offer counseling for these   needs as well as refer you for other special needs.  Make a list of emergency phone numbers and have them available.  Plan on getting help from family or friends when you go home from the hospital.  Make a trial run to the hospital.  Take prenatal classes with the father to understand, practice, and ask questions about the labor and delivery.  Prepare the baby's room or nursery.  Do not travel out of the city unless it is absolutely necessary and with the advice of your caregiver.  Wear only low or no heal shoes to have better balance and prevent falling. MEDICINES AND DRUG USE IN PREGNANCY  Take prenatal vitamins as directed. The vitamin should contain 1 milligram of folic acid. Keep all vitamins out of reach of children. Only a couple vitamins or tablets containing iron may be fatal to a baby or young child when ingested.  Avoid use  of all medicines, including herbs, over-the-counter medicines, not prescribed or suggested by your caregiver. Only take over-the-counter or prescription medicines for pain, discomfort, or fever as directed by your caregiver. Do not use aspirin, ibuprofen or naproxen unless approved by your caregiver.  Let your caregiver also know about herbs you may be using.  Alcohol is related to a number of birth defects. This includes fetal alcohol syndrome. All alcohol, in any form, should be avoided completely. Smoking will cause low birth rate and premature babies.  Illegal drugs are very harmful to the baby. They are absolutely forbidden. A baby born to an addicted mother will be addicted at birth. The baby will go through the same withdrawal an adult does. SEEK MEDICAL CARE IF: You have any concerns or worries during your pregnancy. It is better to call with your questions if you feel they cannot wait, rather than worry about them. SEEK IMMEDIATE MEDICAL CARE IF:   An unexplained oral temperature above 102 F (38.9 C) develops, or as your caregiver suggests.  You have leaking of fluid from the vagina. If leaking membranes are suspected, take your temperature and tell your caregiver of this when you call.  There is vaginal spotting, bleeding or passing clots. Tell your caregiver of the amount and how many pads are used.  You develop a bad smelling vaginal discharge with a change in the color from clear to white.  You develop vomiting that lasts more than 24 hours.  You develop chills or fever.  You develop shortness of breath.  You develop burning on urination.  You loose more than 2 pounds of weight or gain more than 2 pounds of weight or as suggested by your caregiver.  You notice sudden swelling of your face, hands, and feet or legs.  You develop belly (abdominal) pain. Round ligament discomfort is a common non-cancerous (benign) cause of abdominal pain in pregnancy. Your caregiver still  must evaluate you.  You develop a severe headache that does not go away.  You develop visual problems, blurred or double vision.  If you have not felt your baby move for more than 1 hour. If you think the baby is not moving as much as usual, eat something with sugar in it and lie down on your left side for an hour. The baby should move at least 4 to 5 times per hour. Call right away if your baby moves less than that.  You fall, are in a car accident, or any kind of trauma.  There is mental or physical violence at home. Document Released: 06/12/2001   Document Revised: 03/12/2012 Document Reviewed: 12/15/2008 [employer] Patient Information 2014 [employer].  Breastfeeding A change in hormones during your pregnancy causes growth of your breast tissue and an increase in number and size of milk ducts. The hormone prolactin allows proteins, sugars, and fats from your blood supply to make breast milk in your milk-producing glands. The hormone progesterone prevents breast milk from being released before the birth of your baby. After the birth of your baby, your progesterone level decreases allowing breast milk to be released. Thoughts of your baby, as well as his or her sucking or crying, can stimulate the release of milk from the milk-producing glands. Deciding to breastfeed (nurse) is one of the best choices you can make for you and your baby. The information that follows gives a brief review of the benefits, as well as other important skills to know about breastfeeding. BENEFITS OF BREASTFEEDING For your baby  The first milk (colostrum) helps your baby's digestive system function better.   There are antibodies in your milk that help your baby fight off infections.   Your baby has a lower incidence of asthma, allergies, and sudden infant death syndrome (SIDS).   The nutrients in breast milk are better for your baby than infant formulas.  Breast milk improves your baby's brain development.    Your baby will have less gas, colic, and constipation.  Your baby is less likely to develop other conditions, such as childhood obesity, asthma, or diabetes mellitus. For you  Breastfeeding helps develop a very special bond between you and your baby.   Breastfeeding is convenient, always available at the correct temperature, and costs nothing.   Breastfeeding helps to burn calories and helps you lose the weight gained during pregnancy.   Breastfeeding makes your uterus contract back down to normal size faster and slows bleeding following delivery.   Breastfeeding mothers have a lower risk of developing osteoporosis or breast or ovarian cancer later in life.  BREASTFEEDING FREQUENCY  A healthy, full-term baby may breastfeed as often as every hour or space his or her feedings to every 3 hours. Breastfeeding frequency will vary from baby to baby.   Newborns should be fed no less than every 2 3 hours during the day and every 4 5 hours during the night. You should breastfeed a minimum of 8 feedings in a 24 hour period.  Awaken your baby to breastfeed if it has been 3 4 hours since the last feeding.  Breastfeed when you feel the need to reduce the fullness of your breasts or when your newborn shows signs of hunger. Signs that your baby may be hungry include:  Increased alertness or activity.  Stretching.  Movement of the head from side to side.  Movement of the head and opening of the mouth when the corner of the mouth or cheek is stroked (rooting).  Increased sucking sounds, smacking lips, cooing, sighing, or squeaking.  Hand-to-mouth movements.  Increased sucking of fingers or hands.  Fussing.  Intermittent crying.  Signs of extreme hunger will require calming and consoling before you try to feed your baby. Signs of extreme hunger may include:  Restlessness.  A loud, strong cry.  Screaming.  Frequent feeding will help you make more milk and will help prevent  problems, such as sore nipples and engorgement of the breasts.  BREASTFEEDING   Whether lying down or sitting, be sure that the baby's abdomen is facing your abdomen.   Support your breast with 4 fingers under your breast   and your thumb above your nipple. Make sure your fingers are well away from your nipple and your baby's mouth.   Stroke your baby's lips gently with your finger or nipple.   When your baby's mouth is open wide enough, place all of your nipple and as much of the colored area around your nipple (areola) as possible into your baby's mouth.  More areola should be visible above his or her upper lip than below his or her lower lip.  Your baby's tongue should be between his or her lower gum and your breast.  Ensure that your baby's mouth is correctly positioned around the nipple (latched). Your baby's lips should create a seal on your breast.  Signs that your baby has effectively latched onto your nipple include:  Tugging or sucking without pain.  Swallowing heard between sucks.  Absent click or smacking sound.  Muscle movement above and in front of his or her ears with sucking.  Your baby must suck about 2 3 minutes in order to get your milk. Allow your baby to feed on each breast as long as he or she wants. Nurse your baby until he or she unlatches or falls asleep at the first breast, then offer the second breast.  Signs that your baby is full and satisfied include:  A gradual decrease in the number of sucks or complete cessation of sucking.  Falling asleep.  Extension or relaxation of his or her body.  Retention of a small amount of milk in his or her mouth.  Letting go of your breast by himself or herself.  Signs of effective breastfeeding in you include:  Breasts that have increased firmness, weight, and size prior to feeding.  Breasts that are softer after nursing.  Increased milk volume, as well as a change in milk consistency and color by the 5th  day of breastfeeding.  Breast fullness relieved by breastfeeding.  Nipples are not sore, cracked, or bleeding.  If needed, break the suction by putting your finger into the corner of your baby's mouth and sliding your finger between his or her gums. Then, remove your breast from his or her mouth.  It is common for babies to spit up a small amount after a feeding.  Babies often swallow air during feeding. This can make babies fussy. Burping your baby between breasts can help with this.  Vitamin D supplements are recommended for babies who get only breast milk.  Avoid using a pacifier during your baby's first 4 6 weeks.  Avoid supplemental feedings of water, formula, or juice in place of breastfeeding. Breast milk is all the food your baby needs. It is not necessary for your baby to have water or formula. Your breasts will make more milk if supplemental feedings are avoided during the early weeks. HOW TO TELL WHETHER YOUR BABY IS GETTING ENOUGH BREAST MILK Wondering whether or not your baby is getting enough milk is a common concern among mothers. You can be assured that your baby is getting enough milk if:   Your baby is actively sucking and you hear swallowing.   Your baby seems relaxed and satisfied after a feeding.   Your baby nurses at least 8 12 times in a 24 hour time period.  During the first 3 5 days of age:  Your baby is wetting at least 3 5 diapers in a 24 hour period. The urine should be clear and pale yellow.  Your baby is having at least 3 4 stools in   a 24 hour period. The stool should be soft and yellow.  At 5 7 days of age, your baby is having at least 3 6 stools in a 24 hour period. The stool should be seedy and yellow by 5 days of age.  Your baby has a weight loss less than 7 10% during the first 3 days of age.  Your baby does not lose weight after 3 7 days of age.  Your baby gains 4 7 ounces each week after he or she is 4 days of age.  Your baby gains weight  by 5 days of age and is back to birth weight within 2 weeks. ENGORGEMENT In the first week after your baby is born, you may experience extremely full breasts (engorgement). When engorged, your breasts may feel heavy, warm, or tender to the touch. Engorgement peaks within 24 48 hours after delivery of your baby.  Engorgement may be reduced by:  Continuing to breastfeed.  Increasing the frequency of breastfeeding.  Taking warm showers or applying warm, moist heat to your breasts just before each feeding. This increases circulation and helps the milk flow.   Gently massaging your breast before and during the feedings. With your fingertips, massage from your chest wall towards your nipple in a circular motion.   Ensuring that your baby empties at least one breast at every feeding. It also helps to start the next feeding on the opposite breast.   Expressing breast milk by hand or by using a breast pump to empty the breasts if your baby is sleepy, or not nursing well. You may also want to express milk if you are returning to work oryou feel you are getting engorged.  Ensuring your baby is latched on and positioned properly while breastfeeding. If you follow these suggestions, your engorgement should improve in 24 48 hours. If you are still experiencing difficulty, call your lactation consultant or caregiver.  CARING FOR YOURSELF Take care of your breasts.  Bathe or shower daily.   Avoid using soap on your nipples.   Wear a supportive bra. Avoid wearing underwire style bras.  Air dry your nipples for a 3 4minutes after each feeding.   Use only cotton bra pads to absorb breast milk leakage. Leaking of breast milk between feedings is normal.   Use only pure lanolin on your nipples after nursing. You do not need to wash it off before feeding your baby again. Another option is to express a few drops of breast milk and gently massage that milk into your nipples.  Continue breast  self-awareness checks. Take care of yourself.  Eat healthy foods. Alternate 3 meals with 3 snacks.  Avoid foods that you notice affect your baby in a bad way.  Drink milk, fruit juice, and water to satisfy your thirst (about 8 glasses a day).   Rest often, relax, and take your prenatal vitamins to prevent fatigue, stress, and anemia.  Avoid chewing and smoking tobacco.  Avoid alcohol and drug use.  Take over-the-counter and prescribed medicine only as directed by your caregiver or pharmacist. You should always check with your caregiver or pharmacist before taking any new medicine, vitamin, or herbal supplement.  Know that pregnancy is possible while breastfeeding. If desired, talk to your caregiver about family planning and safe birth control methods that may be used while breastfeeding. SEEK MEDICAL CARE IF:   You feel like you want to stop breastfeeding or have become frustrated with breastfeeding.  You have painful breasts or nipples.    Your nipples are cracked or bleeding.  Your breasts are red, tender, or warm.  You have a swollen area on either breast.  You have a fever or chills.  You have nausea or vomiting.  You have drainage from your nipples.  Your breasts do not become full before feedings by the 5th day after delivery.  You feel sad and depressed.  Your baby is too sleepy to eat well.  Your baby is having trouble sleeping.   Your baby is wetting less than 3 diapers in a 24 hour period.  Your baby has less than 3 stools in a 24 hour period.  Your baby's skin or the white part of his or her eyes becomes more yellow.   Your baby is not gaining weight by 5 days of age. MAKE SURE YOU:   Understand these instructions.  Will watch your condition.  Will get help right away if you are not doing well or get worse. Document Released: 06/18/2005 Document Revised: 03/12/2012 Document Reviewed: 01/23/2012 [employer] Patient Information 2014 [employer].  

## 2012-12-09 NOTE — Progress Notes (Signed)
28 wk labs today, U/S scheduled Has not changed medication to labetalol secondary to twice daily dosing and on this medication for previous babies. Discussed risk of IUGR.

## 2012-12-09 NOTE — Progress Notes (Signed)
Doing 1hr GTT today.  

## 2012-12-10 LAB — GLUCOSE TOLERANCE, 1 HOUR (50G) W/O FASTING: Glucose, 1 Hour GTT: 141 mg/dL — ABNORMAL HIGH (ref 70–140)

## 2012-12-10 LAB — RPR

## 2012-12-10 LAB — HIV ANTIBODY (ROUTINE TESTING W REFLEX): HIV: NONREACTIVE

## 2012-12-18 ENCOUNTER — Ambulatory Visit (HOSPITAL_COMMUNITY)
Admission: RE | Admit: 2012-12-18 | Discharge: 2012-12-18 | Disposition: A | Discharge status: Home / Self Care | Payer: Medicaid Other | Source: Ambulatory Visit | Attending: Family Medicine | Admitting: Family Medicine

## 2012-12-18 DIAGNOSIS — O99330 Smoking (tobacco) complicating pregnancy, unspecified trimester: Secondary | ICD-10-CM | POA: Insufficient documentation

## 2012-12-18 DIAGNOSIS — O358XX0 Maternal care for other (suspected) fetal abnormality and damage, not applicable or unspecified: Secondary | ICD-10-CM | POA: Insufficient documentation

## 2012-12-18 DIAGNOSIS — Z363 Encounter for antenatal screening for malformations: Secondary | ICD-10-CM | POA: Insufficient documentation

## 2012-12-18 DIAGNOSIS — O10012 Pre-existing essential hypertension complicating pregnancy, second trimester: Secondary | ICD-10-CM

## 2012-12-18 DIAGNOSIS — Z1389 Encounter for screening for other disorder: Secondary | ICD-10-CM | POA: Insufficient documentation

## 2012-12-18 DIAGNOSIS — O10019 Pre-existing essential hypertension complicating pregnancy, unspecified trimester: Secondary | ICD-10-CM | POA: Insufficient documentation

## 2012-12-18 IMAGING — US US OB FOLLOW-UP
1 series · 12 of 28 positions shown · non-contrast
Comparison: none

[Series 1: us ob follow-up · 42 acquisitions, 12 frames shown]
[im 2/42]
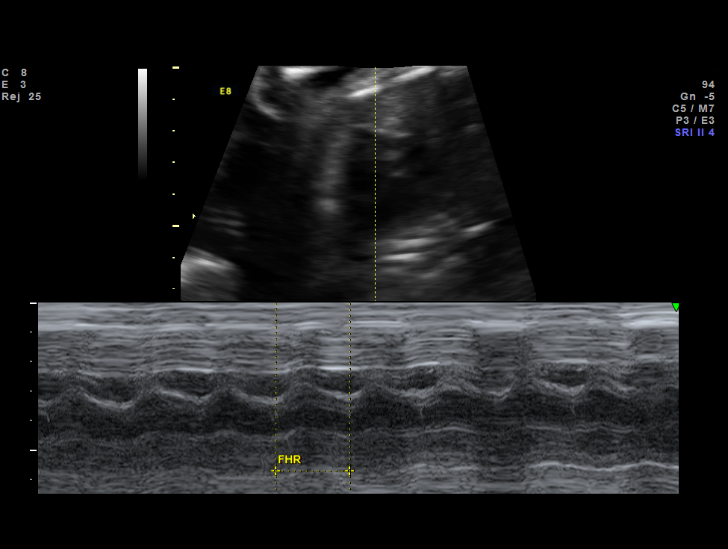
[im 5/42]
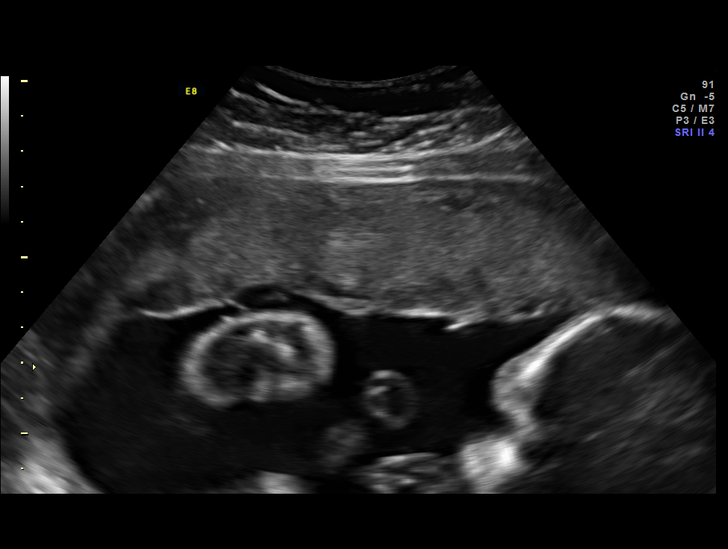
[im 8/42]
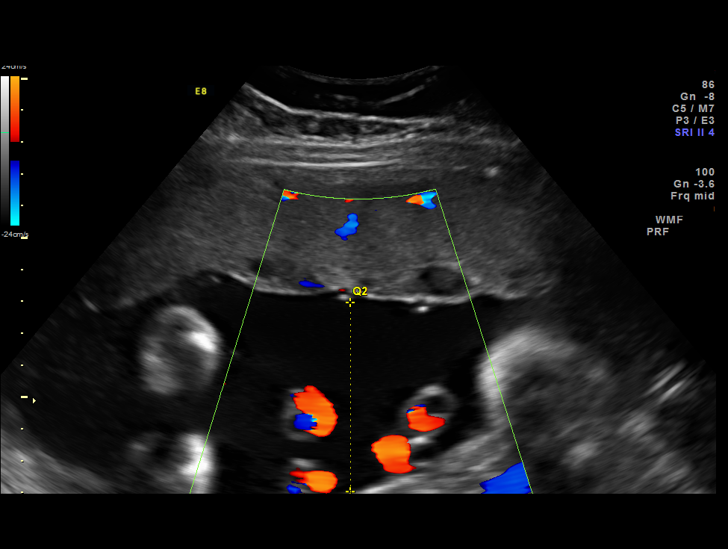
[im 13/42]
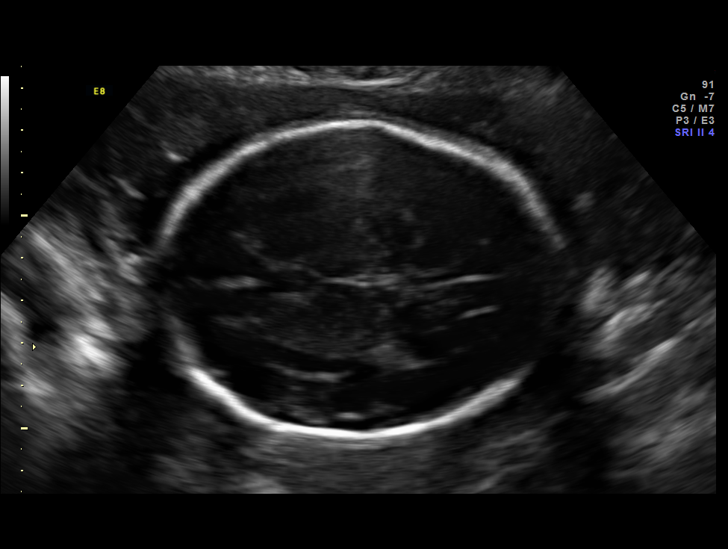
[im 16/42]
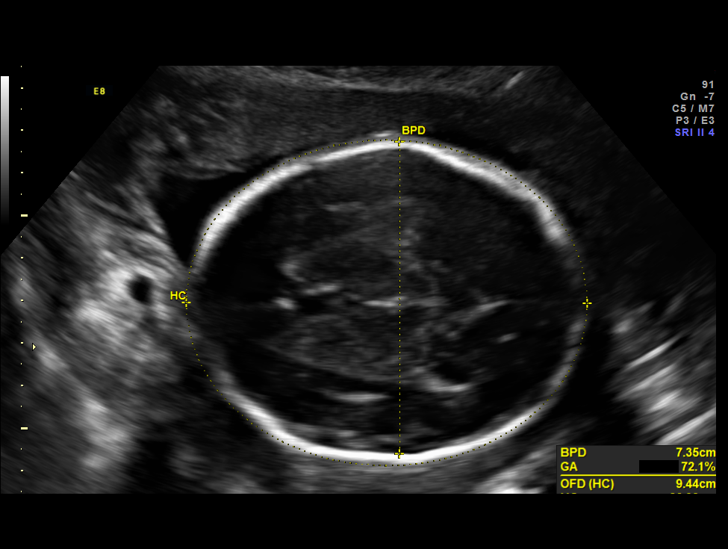
[im 19/42]
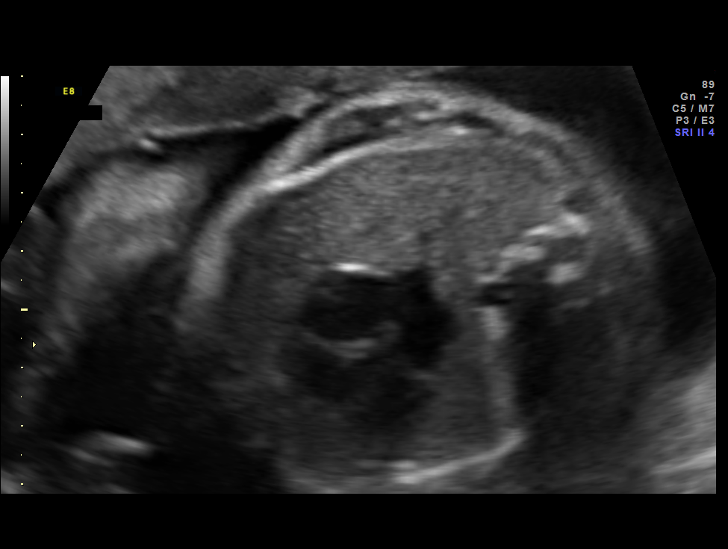
[im 23/42]
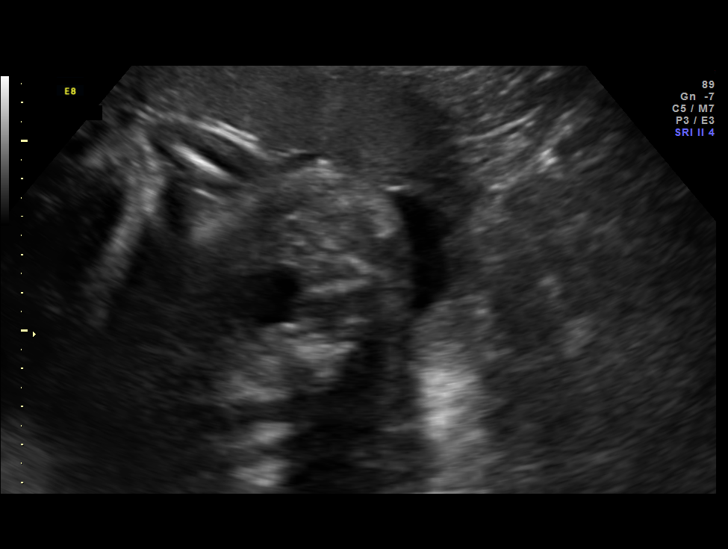
[im 26/42]
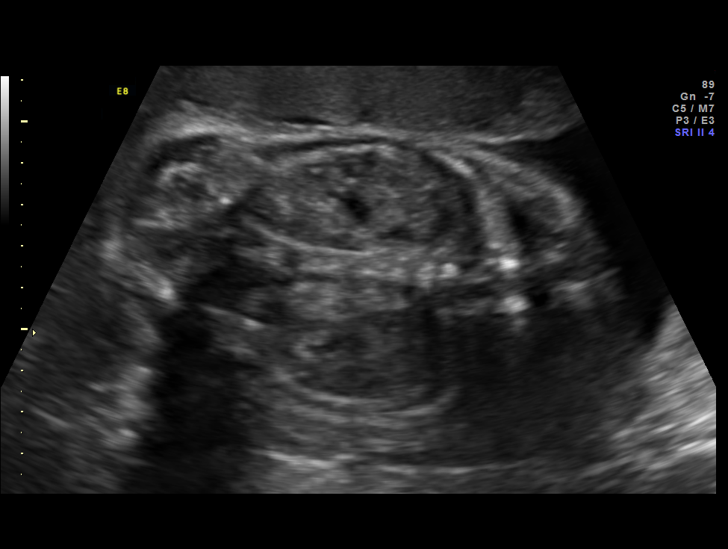
[im 29/42]
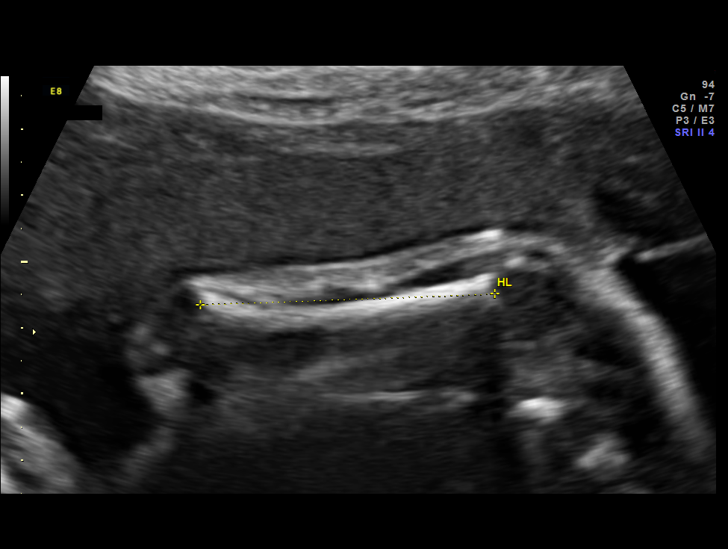
[im 34/42]
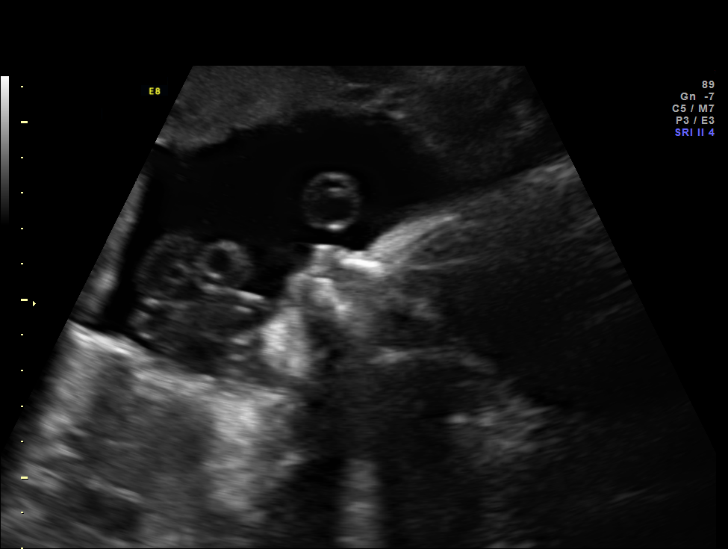
[im 37/42]
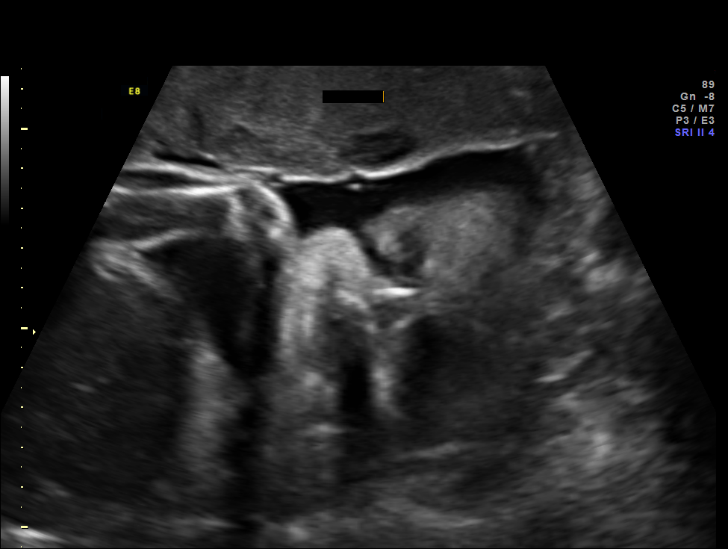
[im 40/42]
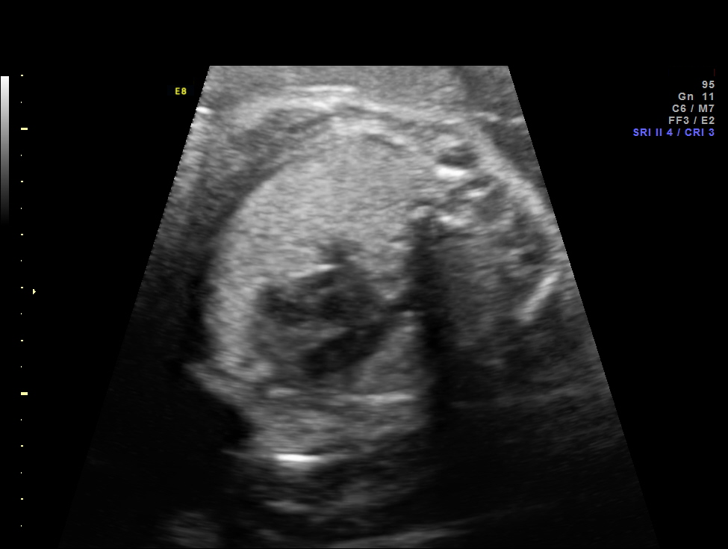

[12 of 28 positions shown; findings below may reference images not displayed]

OBSTETRICS REPORT
                      (Signed Final [DATE] [DATE])

Service(s) Provided

 US OB FOLLOW UP                                       76816.1
Indications

 Detailed fetal anatomic survey                        655.83 [44]
 Hypertension - Chronic/Pre-existing; on Atenolol
 Cigarette smoker
Fetal Evaluation

 Num Of Fetuses:    1
 Fetal Heart Rate:  136                          bpm
 Cardiac Activity:  Observed
 Presentation:      Cephalic
 Placenta:          Anterior, above cervical os
 P. Cord            Previously Visualized
 Insertion:

 Amniotic Fluid
 AFI FV:      Subjectively within normal limits
 AFI Sum:     22.36   cm       92  %Tile     Larg Pckt:    5.96  cm
 RUQ:   5.96    cm   RLQ:    5.57   cm    LUQ:   5.94    cm   LLQ:    4.89   cm
Biometry

 BPD:     73.5  mm     G. Age:  29w 4d                CI:         77.4   70 - 86
 OFD:       95  mm                                    FL/HC:      17.8   18.8 -

 HC:     269.8  mm     G. Age:  29w 3d       49  %    HC/AC:      1.07   1.05 -

 AC:     251.7  mm     G. Age:  29w 3d       71  %    FL/BPD:     65.4   71 - 87
 FL:      48.1  mm     G. Age:  26w 1d      < 3  %    FL/AC:      19.1   20 - 24
 HUM:     44.3  mm     G. Age:  26w 2d      < 5  %
 CER:     37.3  mm     G. Age:  32w 1d     > 95  %

 Est. FW:    [44]  gm    2 lb 11 oz      49  %
Gestational Age

 LMP:           29w 1d        Date:  [DATE]                 EDD:   [DATE]
 U/S Today:     28w 4d                                        EDD:   [DATE]
 Best:          28w 3d     Det. By:  U/S C R L   ([DATE])   EDD:   [DATE]
Anatomy
 Cranium:          Appears normal         Aortic Arch:      Previously seen
 Fetal Cavum:      Appears normal         Ductal Arch:      Previously seen
 Ventricles:       Appears normal         Diaphragm:        Appears normal
 Choroid Plexus:   Previously seen        Stomach:          Appears normal, left
                                                            sided
 Cerebellum:       Appears normal         Abdomen:          Appears normal
 Posterior Fossa:  Appears normal         Abdominal Wall:   Previously seen
 Nuchal Fold:      Previously seen        Cord Vessels:     Previously seen
 Face:             Orbits and profile     Kidneys:          Appear normal
                   previously seen
 Lips:             Previously seen        Bladder:          Appears normal
 Heart:            Appears normal         Spine:            Previously seen
                   (4CH, axis, and
                   situs)
 RVOT:             Previously seen        Lower             Previously seen
                                          Extremities:
 LVOT:             Previously seen        Upper             Previously seen
                                          Extremities:

 Other:  Male gender. Heels and 5th digit previously visualized.
Targeted Anatomy

 Fetal Central Nervous System
 Cisterna Magna:
Cervix Uterus Adnexa

 Cervix:       Not visualized.

 Left Ovary:    Within normal limits.
 Right Ovary:   Within normal limits.
 Adnexa:     No abnormality visualized.
Impression

 IUP at 28+3 weeks
 Normal interval anatomy; anatomic survey complete
 Normal amniotic fluid volume
 Appropriate interval growth with EFW at the 49th %tile
Recommendations

 Follow-up ultrasound for growth in 4 weeks

## 2012-12-24 ENCOUNTER — Ambulatory Visit (INDEPENDENT_AMBULATORY_CARE_PROVIDER_SITE_OTHER): Payer: Medicaid Other | Admitting: Family Medicine

## 2012-12-24 VITALS — BP 123/73 | Wt 196.0 lb

## 2012-12-24 DIAGNOSIS — Z139 Encounter for screening, unspecified: Secondary | ICD-10-CM

## 2012-12-24 DIAGNOSIS — O10019 Pre-existing essential hypertension complicating pregnancy, unspecified trimester: Secondary | ICD-10-CM

## 2012-12-24 DIAGNOSIS — Z3480 Encounter for supervision of other normal pregnancy, unspecified trimester: Secondary | ICD-10-CM

## 2012-12-24 DIAGNOSIS — O10013 Pre-existing essential hypertension complicating pregnancy, third trimester: Secondary | ICD-10-CM

## 2012-12-24 DIAGNOSIS — Z3483 Encounter for supervision of other normal pregnancy, third trimester: Secondary | ICD-10-CM

## 2012-12-24 MED ORDER — ATENOLOL 50 MG PO TABS: 50.0000 mg | ORAL_TABLET | Freq: Every day | ORAL | Status: DC

## 2012-12-24 NOTE — Patient Instructions (Addendum)
Pregnancy - Third Trimester The third trimester of pregnancy (the last 3 months) is a period of the most rapid growth for you and your baby. The baby approaches a length of 20 inches and a weight of 6 to 10 pounds. The baby is adding on fat and getting ready for life outside your body. While inside, babies have periods of sleeping and waking, sucking thumbs, and hiccuping. You can often feel small contractions of the uterus. This is false labor. It is also called Braxton-Hicks contractions. This is like a practice for labor. The usual problems in this stage of pregnancy include more difficulty breathing, swelling of the hands and feet from water retention, and having to urinate more often because of the uterus and baby pressing on your bladder.  PRENATAL EXAMS  Blood work may continue to be done during prenatal exams. These tests are done to check on your health and the probable health of your baby. Blood work is used to follow your blood levels (hemoglobin). Anemia (low hemoglobin) is common during pregnancy. Iron and vitamins are given to help prevent this. You may also continue to be checked for diabetes. Some of the past blood tests may be done again.  The size of the uterus is measured during each visit. This makes sure your baby is growing properly according to your pregnancy dates.  Your blood pressure is checked every prenatal visit. This is to make sure you are not getting toxemia.  Your urine is checked every prenatal visit for infection, diabetes, and protein.  Your weight is checked at each visit. This is done to make sure gains are happening at the suggested rate and that you and your baby are growing normally.  Sometimes, an ultrasound is performed to confirm the position and the proper growth and development of the baby. This is a test done that bounces harmless sound waves off the baby so your caregiver can more accurately determine a due date.  Discuss the type of pain medicine and  anesthesia you will have during your labor and delivery.  Discuss the possibility and anesthesia if a cesarean section might be necessary.  Inform your caregiver if there is any mental or physical violence at home. Sometimes, a specialized non-stress test, contraction stress test, and biophysical profile are done to make sure the baby is not having a problem. Checking the amniotic fluid surrounding the baby is called an amniocentesis. The amniotic fluid is removed by sticking a needle into the belly (abdomen). This is sometimes done near the end of pregnancy if an early delivery is required. In this case, it is done to help make sure the baby's lungs are mature enough for the baby to live outside of the womb. If the lungs are not mature and it is unsafe to deliver the baby, an injection of cortisone medicine is given to the mother 1 to 2 days before the delivery. This helps the baby's lungs mature and makes it safer to deliver the baby. CHANGES OCCURING IN THE THIRD TRIMESTER OF PREGNANCY Your body goes through many changes during pregnancy. They vary from person to person. Talk to your caregiver about changes you notice and are concerned about.  During the last trimester, you have probably had an increase in your appetite. It is normal to have cravings for certain foods. This varies from person to person and pregnancy to pregnancy.  You may begin to get stretch marks on your hips, abdomen, and breasts. These are normal changes in the body   during pregnancy. There are no exercises or medicines to take which prevent this change.  Constipation may be treated with a stool softener or adding bulk to your diet. Drinking lots of fluids, fiber in vegetables, fruits, and whole grains are helpful.  Exercising is also helpful. If you have been very active up until your pregnancy, most of these activities can be continued during your pregnancy. If you have been less active, it is helpful to start an exercise  program such as walking. Consult your caregiver before starting exercise programs.  Avoid all smoking, alcohol, non-prescribed drugs, herbs and "street drugs" during your pregnancy. These chemicals affect the formation and growth of the baby. Avoid chemicals throughout the pregnancy to ensure the delivery of a healthy infant.  Backache, varicose veins, and hemorrhoids may develop or get worse.  You will tire more easily in the third trimester, which is normal.  The baby's movements may be stronger and more often.  You may become short of breath easily.  Your belly button may stick out.  A yellow discharge may leak from your breasts called colostrum.  You may have a bloody mucus discharge. This usually occurs a few days to a week before labor begins. HOME CARE INSTRUCTIONS   Keep your caregiver's appointments. Follow your caregiver's instructions regarding medicine use, exercise, and diet.  During pregnancy, you are providing food for you and your baby. Continue to eat regular, well-balanced meals. Choose foods such as meat, fish, milk and other low fat dairy products, vegetables, fruits, and whole-grain breads and cereals. Your caregiver will tell you of the ideal weight gain.  A physical sexual relationship may be continued throughout pregnancy if there are no other problems such as early (premature) leaking of amniotic fluid from the membranes, vaginal bleeding, or belly (abdominal) pain.  Exercise regularly if there are no restrictions. Check with your caregiver if you are unsure of the safety of your exercises. Greater weight gain will occur in the last 2 trimesters of pregnancy. Exercising helps:  Control your weight.  Get you in shape for labor and delivery.  You lose weight after you deliver.  Rest a lot with legs elevated, or as needed for leg cramps or low back pain.  Wear a good support or jogging bra for breast tenderness during pregnancy. This may help if worn during  sleep. Pads or tissues may be used in the bra if you are leaking colostrum.  Do not use hot tubs, steam rooms, or saunas.  Wear your seat belt when driving. This protects you and your baby if you are in an accident.  Avoid raw meat, cat litter boxes and soil used by cats. These carry germs that can cause birth defects in the baby.  It is easier to leak urine during pregnancy. Tightening up and strengthening the pelvic muscles will help with this problem. You can practice stopping your urination while you are going to the bathroom. These are the same muscles you need to strengthen. It is also the muscles you would use if you were trying to stop from passing gas. You can practice tightening these muscles up 10 times a set and repeating this about 3 times per day. Once you know what muscles to tighten up, do not perform these exercises during urination. It is more likely to cause an infection by backing up the urine.  Ask for help if you have financial, counseling, or nutritional needs during pregnancy. Your caregiver will be able to offer counseling for these   needs as well as refer you for other special needs.  Make a list of emergency phone numbers and have them available.  Plan on getting help from family or friends when you go home from the hospital.  Make a trial run to the hospital.  Take prenatal classes with the father to understand, practice, and ask questions about the labor and delivery.  Prepare the baby's room or nursery.  Do not travel out of the city unless it is absolutely necessary and with the advice of your caregiver.  Wear only low or no heal shoes to have better balance and prevent falling. MEDICINES AND DRUG USE IN PREGNANCY  Take prenatal vitamins as directed. The vitamin should contain 1 milligram of folic acid. Keep all vitamins out of reach of children. Only a couple vitamins or tablets containing iron may be fatal to a baby or young child when ingested.  Avoid use  of all medicines, including herbs, over-the-counter medicines, not prescribed or suggested by your caregiver. Only take over-the-counter or prescription medicines for pain, discomfort, or fever as directed by your caregiver. Do not use aspirin, ibuprofen or naproxen unless approved by your caregiver.  Let your caregiver also know about herbs you may be using.  Alcohol is related to a number of birth defects. This includes fetal alcohol syndrome. All alcohol, in any form, should be avoided completely. Smoking will cause low birth rate and premature babies.  Illegal drugs are very harmful to the baby. They are absolutely forbidden. A baby born to an addicted mother will be addicted at birth. The baby will go through the same withdrawal an adult does. SEEK MEDICAL CARE IF: You have any concerns or worries during your pregnancy. It is better to call with your questions if you feel they cannot wait, rather than worry about them. SEEK IMMEDIATE MEDICAL CARE IF:   An unexplained oral temperature above 102 F (38.9 C) develops, or as your caregiver suggests.  You have leaking of fluid from the vagina. If leaking membranes are suspected, take your temperature and tell your caregiver of this when you call.  There is vaginal spotting, bleeding or passing clots. Tell your caregiver of the amount and how many pads are used.  You develop a bad smelling vaginal discharge with a change in the color from clear to white.  You develop vomiting that lasts more than 24 hours.  You develop chills or fever.  You develop shortness of breath.  You develop burning on urination.  You loose more than 2 pounds of weight or gain more than 2 pounds of weight or as suggested by your caregiver.  You notice sudden swelling of your face, hands, and feet or legs.  You develop belly (abdominal) pain. Round ligament discomfort is a common non-cancerous (benign) cause of abdominal pain in pregnancy. Your caregiver still  must evaluate you.  You develop a severe headache that does not go away.  You develop visual problems, blurred or double vision.  If you have not felt your baby move for more than 1 hour. If you think the baby is not moving as much as usual, eat something with sugar in it and lie down on your left side for an hour. The baby should move at least 4 to 5 times per hour. Call right away if your baby moves less than that.  You fall, are in a car accident, or any kind of trauma.  There is mental or physical violence at home. Document Released: 06/12/2001   Document Revised: 03/12/2012 Document Reviewed: 12/15/2008 [employer] Patient Information 2014 [employer].  Breastfeeding A change in hormones during your pregnancy causes growth of your breast tissue and an increase in number and size of milk ducts. The hormone prolactin allows proteins, sugars, and fats from your blood supply to make breast milk in your milk-producing glands. The hormone progesterone prevents breast milk from being released before the birth of your baby. After the birth of your baby, your progesterone level decreases allowing breast milk to be released. Thoughts of your baby, as well as his or her sucking or crying, can stimulate the release of milk from the milk-producing glands. Deciding to breastfeed (nurse) is one of the best choices you can make for you and your baby. The information that follows gives a brief review of the benefits, as well as other important skills to know about breastfeeding. BENEFITS OF BREASTFEEDING For your baby  The first milk (colostrum) helps your baby's digestive system function better.   There are antibodies in your milk that help your baby fight off infections.   Your baby has a lower incidence of asthma, allergies, and sudden infant death syndrome (SIDS).   The nutrients in breast milk are better for your baby than infant formulas.  Breast milk improves your baby's brain development.    Your baby will have less gas, colic, and constipation.  Your baby is less likely to develop other conditions, such as childhood obesity, asthma, or diabetes mellitus. For you  Breastfeeding helps develop a very special bond between you and your baby.   Breastfeeding is convenient, always available at the correct temperature, and costs nothing.   Breastfeeding helps to burn calories and helps you lose the weight gained during pregnancy.   Breastfeeding makes your uterus contract back down to normal size faster and slows bleeding following delivery.   Breastfeeding mothers have a lower risk of developing osteoporosis or breast or ovarian cancer later in life.  BREASTFEEDING FREQUENCY  A healthy, full-term baby may breastfeed as often as every hour or space his or her feedings to every 3 hours. Breastfeeding frequency will vary from baby to baby.   Newborns should be fed no less than every 2 3 hours during the day and every 4 5 hours during the night. You should breastfeed a minimum of 8 feedings in a 24 hour period.  Awaken your baby to breastfeed if it has been 3 4 hours since the last feeding.  Breastfeed when you feel the need to reduce the fullness of your breasts or when your newborn shows signs of hunger. Signs that your baby may be hungry include:  Increased alertness or activity.  Stretching.  Movement of the head from side to side.  Movement of the head and opening of the mouth when the corner of the mouth or cheek is stroked (rooting).  Increased sucking sounds, smacking lips, cooing, sighing, or squeaking.  Hand-to-mouth movements.  Increased sucking of fingers or hands.  Fussing.  Intermittent crying.  Signs of extreme hunger will require calming and consoling before you try to feed your baby. Signs of extreme hunger may include:  Restlessness.  A loud, strong cry.  Screaming.  Frequent feeding will help you make more milk and will help prevent  problems, such as sore nipples and engorgement of the breasts.  BREASTFEEDING   Whether lying down or sitting, be sure that the baby's abdomen is facing your abdomen.   Support your breast with 4 fingers under your breast   and your thumb above your nipple. Make sure your fingers are well away from your nipple and your baby's mouth.   Stroke your baby's lips gently with your finger or nipple.   When your baby's mouth is open wide enough, place all of your nipple and as much of the colored area around your nipple (areola) as possible into your baby's mouth.  More areola should be visible above his or her upper lip than below his or her lower lip.  Your baby's tongue should be between his or her lower gum and your breast.  Ensure that your baby's mouth is correctly positioned around the nipple (latched). Your baby's lips should create a seal on your breast.  Signs that your baby has effectively latched onto your nipple include:  Tugging or sucking without pain.  Swallowing heard between sucks.  Absent click or smacking sound.  Muscle movement above and in front of his or her ears with sucking.  Your baby must suck about 2 3 minutes in order to get your milk. Allow your baby to feed on each breast as long as he or she wants. Nurse your baby until he or she unlatches or falls asleep at the first breast, then offer the second breast.  Signs that your baby is full and satisfied include:  A gradual decrease in the number of sucks or complete cessation of sucking.  Falling asleep.  Extension or relaxation of his or her body.  Retention of a small amount of milk in his or her mouth.  Letting go of your breast by himself or herself.  Signs of effective breastfeeding in you include:  Breasts that have increased firmness, weight, and size prior to feeding.  Breasts that are softer after nursing.  Increased milk volume, as well as a change in milk consistency and color by the 5th  day of breastfeeding.  Breast fullness relieved by breastfeeding.  Nipples are not sore, cracked, or bleeding.  If needed, break the suction by putting your finger into the corner of your baby's mouth and sliding your finger between his or her gums. Then, remove your breast from his or her mouth.  It is common for babies to spit up a small amount after a feeding.  Babies often swallow air during feeding. This can make babies fussy. Burping your baby between breasts can help with this.  Vitamin D supplements are recommended for babies who get only breast milk.  Avoid using a pacifier during your baby's first 4 6 weeks.  Avoid supplemental feedings of water, formula, or juice in place of breastfeeding. Breast milk is all the food your baby needs. It is not necessary for your baby to have water or formula. Your breasts will make more milk if supplemental feedings are avoided during the early weeks. HOW TO TELL WHETHER YOUR BABY IS GETTING ENOUGH BREAST MILK Wondering whether or not your baby is getting enough milk is a common concern among mothers. You can be assured that your baby is getting enough milk if:   Your baby is actively sucking and you hear swallowing.   Your baby seems relaxed and satisfied after a feeding.   Your baby nurses at least 8 12 times in a 24 hour time period.  During the first 3 5 days of age:  Your baby is wetting at least 3 5 diapers in a 24 hour period. The urine should be clear and pale yellow.  Your baby is having at least 3 4 stools in   a 24 hour period. The stool should be soft and yellow.  At 5 7 days of age, your baby is having at least 3 6 stools in a 24 hour period. The stool should be seedy and yellow by 5 days of age.  Your baby has a weight loss less than 7 10% during the first 3 days of age.  Your baby does not lose weight after 3 7 days of age.  Your baby gains 4 7 ounces each week after he or she is 4 days of age.  Your baby gains weight  by 5 days of age and is back to birth weight within 2 weeks. ENGORGEMENT In the first week after your baby is born, you may experience extremely full breasts (engorgement). When engorged, your breasts may feel heavy, warm, or tender to the touch. Engorgement peaks within 24 48 hours after delivery of your baby.  Engorgement may be reduced by:  Continuing to breastfeed.  Increasing the frequency of breastfeeding.  Taking warm showers or applying warm, moist heat to your breasts just before each feeding. This increases circulation and helps the milk flow.   Gently massaging your breast before and during the feedings. With your fingertips, massage from your chest wall towards your nipple in a circular motion.   Ensuring that your baby empties at least one breast at every feeding. It also helps to start the next feeding on the opposite breast.   Expressing breast milk by hand or by using a breast pump to empty the breasts if your baby is sleepy, or not nursing well. You may also want to express milk if you are returning to work oryou feel you are getting engorged.  Ensuring your baby is latched on and positioned properly while breastfeeding. If you follow these suggestions, your engorgement should improve in 24 48 hours. If you are still experiencing difficulty, call your lactation consultant or caregiver.  CARING FOR YOURSELF Take care of your breasts.  Bathe or shower daily.   Avoid using soap on your nipples.   Wear a supportive bra. Avoid wearing underwire style bras.  Air dry your nipples for a 3 4minutes after each feeding.   Use only cotton bra pads to absorb breast milk leakage. Leaking of breast milk between feedings is normal.   Use only pure lanolin on your nipples after nursing. You do not need to wash it off before feeding your baby again. Another option is to express a few drops of breast milk and gently massage that milk into your nipples.  Continue breast  self-awareness checks. Take care of yourself.  Eat healthy foods. Alternate 3 meals with 3 snacks.  Avoid foods that you notice affect your baby in a bad way.  Drink milk, fruit juice, and water to satisfy your thirst (about 8 glasses a day).   Rest often, relax, and take your prenatal vitamins to prevent fatigue, stress, and anemia.  Avoid chewing and smoking tobacco.  Avoid alcohol and drug use.  Take over-the-counter and prescribed medicine only as directed by your caregiver or pharmacist. You should always check with your caregiver or pharmacist before taking any new medicine, vitamin, or herbal supplement.  Know that pregnancy is possible while breastfeeding. If desired, talk to your caregiver about family planning and safe birth control methods that may be used while breastfeeding. SEEK MEDICAL CARE IF:   You feel like you want to stop breastfeeding or have become frustrated with breastfeeding.  You have painful breasts or nipples.    Your nipples are cracked or bleeding.  Your breasts are red, tender, or warm.  You have a swollen area on either breast.  You have a fever or chills.  You have nausea or vomiting.  You have drainage from your nipples.  Your breasts do not become full before feedings by the 5th day after delivery.  You feel sad and depressed.  Your baby is too sleepy to eat well.  Your baby is having trouble sleeping.   Your baby is wetting less than 3 diapers in a 24 hour period.  Your baby has less than 3 stools in a 24 hour period.  Your baby's skin or the white part of his or her eyes becomes more yellow.   Your baby is not gaining weight by 75 days of age. MAKE SURE YOU:   Understand these instructions.  Will watch your condition.  Will get help right away if you are not doing well or get worse. Document Released: 06/18/2005 Document Revised: 03/12/2012 Document Reviewed: 01/23/2012 [facility] Patient Information 2014 [place],  [place]. Endometrial Ablation Endometrial ablation removes the lining of the uterus (endometrium). It is usually a same day, outpatient treatment. Ablation helps avoid major surgery (such as a hysterectomy). A hysterectomy is removal of the cervix and uterus. Endometrial ablation has less risk and complications, has a shorter recovery period and is less expensive. After endometrial ablation, most women will have little or no menstrual bleeding. You may not keep your fertility. Pregnancy is no longer likely after this procedure but if you are pre-menopausal, you still need to use a reliable method of birth control following the procedure because pregnancy can occur. REASONS TO HAVE THE PROCEDURE MAY INCLUDE:  Heavy periods.  Bleeding that is causing anemia.  Anovulatory bleeding, very irregular, bleeding.  Bleeding submucous fibroids (on the lining inside the uterus) if they are smaller than 3 centimeters. REASONS NOT TO HAVE THE PROCEDURE MAY INCLUDE:  You wish to have more children.  You have a pre-cancerous or cancerous problem. The cause of any abnormal bleeding must be diagnosed before having the procedure.  You have pain coming from the uterus.  You have a submucus fibroid larger than 3 centimeters.  You recently had a baby.  You recently had an infection in the uterus.  You have a severe retro-flexed, tipped uterus and cannot insert the instrument to do the ablation.  You had a Cesarean section or deep major surgery on the uterus.  The inner cavity of the uterus is too large for the endometrial ablation instrument. RISKS AND COMPLICATIONS   Perforation of the uterus.  Bleeding.  Infection of the uterus, bladder or vagina.  Injury to surrounding organs.  Cutting the cervix.  An air bubble to the lung (air embolus).  Pregnancy following the procedure.  Failure of the procedure to help the problem requiring hysterectomy.  Decreased ability to diagnose cancer in the  lining of the uterus. BEFORE THE PROCEDURE  The lining of the uterus must be tested to make sure there is no pre-cancerous or cancer cells present.  Medications may be given to make the lining of the uterus thinner.  Ultrasound may be used to evaluate the size and look for abnormalities of the uterus.  Future pregnancy is not desired. PROCEDURE  There are different ways to destroy the lining of the uterus.   Resectoscope - radio frequency-alternating electric current is the most common one used.  Cryotherapy - freezing the lining of the uterus.  Heated Free Liquid -  heated salt (saline) solution inserted into the uterus.  Microwave - uses high energy microwaves in the uterus.  Thermal Balloon - a catheter with a balloon tip is inserted into the uterus and filled with heated fluid. Your caregiver will talk with you about the method used in this clinic. They will also instruct you on the pros and cons of the procedure. Endometrial ablation is performed along with a procedure called operative hysteroscopy. A narrow viewing tube is inserted through the birth canal (vagina) and through the cervix into the uterus. A tiny camera attached to the viewing tube (hysteroscope) allows the uterine cavity to be shown on a TV monitor during surgery. Your uterus is filled with a harmless liquid to make the procedure easier. The lining of the uterus is then removed. The lining can also be removed with a resectoscope which allows your surgeon to cut away the lining of the uterus under direct vision. Usually, you will be able to go home within an hour after the procedure. HOME CARE INSTRUCTIONS   Do not drive for 24 hours.  No tampons, douching or intercourse for 2 weeks or until your caregiver approves.  Rest at home for 24 to 48 hours. You may then resume normal activities unless told differently by your caregiver.  Take your temperature two times a day for 4 days, and record it.  Take any medications  your caregiver has ordered, as directed.  Use some form of contraception if you are pre-menopausal and do not want to get pregnant. Bleeding after the procedure is normal. It varies from light spotting and mildly watery to bloody discharge for 4 to 6 weeks. You may also have mild cramping. Only take over-the-counter or prescription medicines for pain, discomfort, or fever as directed by your caregiver. Do not use aspirin, as this may aggravate bleeding. Frequent urination during the first 24 hours is normal. You will not know how effective your surgery is until at least 3 months after the surgery. SEEK IMMEDIATE MEDICAL CARE IF:   Bleeding is heavier than a normal menstrual cycle.  An oral temperature above 102 F (38.9 C) develops.  You have increasing cramps or pains not relieved with medication or develop belly (abdominal) pain which does not seem to be related to the same area of earlier cramping and pain.  You are light headed, weak or have fainting episodes.  You develop pain in the shoulder strap areas.  You have chest or leg pain.  You have abnormal vaginal discharge.  You have painful urination. Document Released: 04/27/2004 Document Revised: 09/10/2011 Document Reviewed: 07/26/2007 [facility] Patient Information 2014 [place], [place].

## 2012-12-24 NOTE — Progress Notes (Signed)
Failed 1 hour--doing 3 hour today.  Recent growth WNL.

## 2012-12-24 NOTE — Progress Notes (Signed)
[id number] 

## 2012-12-24 NOTE — Addendum Note (Signed)
Addended by: Arne Cleveland on: 12/24/2012 04:08 PM   Modules accepted: Orders

## 2012-12-25 ENCOUNTER — Encounter: Payer: Self-pay | Admitting: Family Medicine

## 2012-12-25 LAB — CBC
HCT: 31.1 % — ABNORMAL LOW (ref 36.0–46.0)
Hemoglobin: 10.5 g/dL — ABNORMAL LOW (ref 12.0–15.0)
MCH: 29.3 pg (ref 26.0–34.0)
MCHC: 33.8 g/dL (ref 30.0–36.0)
MCV: 86.9 fL (ref 78.0–100.0)
RBC: 3.58 MIL/uL — ABNORMAL LOW (ref 3.87–5.11)

## 2012-12-25 LAB — GLUCOSE TOLERANCE, 3 HOURS
Glucose Tolerance, Fasting: 76 mg/dL (ref 70–104)
Glucose, GTT - 3 Hour: 82 mg/dL (ref 70–144)

## 2012-12-29 ENCOUNTER — Telehealth: Payer: Self-pay | Admitting: *Deleted

## 2012-12-29 NOTE — Telephone Encounter (Signed)
Called pt to inform she passed 3hr GTT - LMOM

## 2012-12-29 NOTE — Telephone Encounter (Signed)
Message copied by Arne Cleveland on Mon Dec 29, 2012  9:02 AM ------      Message from: Reva Bores      Created: Thu Dec 25, 2012  3:52 PM       She passed her 3 hour--please inform pt. ------

## 2013-01-07 ENCOUNTER — Ambulatory Visit (INDEPENDENT_AMBULATORY_CARE_PROVIDER_SITE_OTHER): Payer: Medicaid Other | Admitting: Family Medicine

## 2013-01-07 VITALS — BP 135/80 | Wt 195.0 lb

## 2013-01-07 DIAGNOSIS — O10013 Pre-existing essential hypertension complicating pregnancy, third trimester: Secondary | ICD-10-CM

## 2013-01-07 DIAGNOSIS — Z3480 Encounter for supervision of other normal pregnancy, unspecified trimester: Secondary | ICD-10-CM

## 2013-01-07 DIAGNOSIS — O10019 Pre-existing essential hypertension complicating pregnancy, unspecified trimester: Secondary | ICD-10-CM

## 2013-01-07 NOTE — Progress Notes (Signed)
Normal 3 hour. Needs f/u U/s for growth and start 2x/wk testing next week.

## 2013-01-07 NOTE — Patient Instructions (Signed)
Preeclampsia and Eclampsia Preeclampsia is a condition of high blood pressure during pregnancy. It can happen at 20 weeks or later in pregnancy. If high blood pressure occurs in the second half of pregnancy with no other symptoms, it is called gestational hypertension and goes away after the baby is born. If any of the symptoms listed below develop with gestational hypertension, it is then called preeclampsia. Eclampsia (convulsions) may follow preeclampsia. This is one of the reasons for regular prenatal checkups. Early diagnosis and treatment are very important to prevent eclampsia. CAUSES  There is no known cause of preeclampsia/eclampsia in pregnancy. There are several known conditions that may put the pregnant woman at risk, such as:  The first pregnancy.  Having preeclampsia in a past pregnancy.  Having lasting (chronic) high blood pressure.  Having multiples (twins, triplets).  Being age 35 or older.  African American ethnic background.  Having kidney disease or diabetes.  Medical conditions such as lupus or blood diseases.  Being overweight (obese). SYMPTOMS   High blood pressure.  Headaches.  Sudden weight gain.  Swelling of hands, face, legs, and feet.  Protein in the urine.  Feeling sick to your stomach (nauseous) and throwing up (vomiting).  Vision problems (blurred or double vision).  Numbness in the face, arms, legs, and feet.  Dizziness.  Slurred speech.  Preeclampsia can cause growth retardation in the fetus.  Separation (abruption) of the placenta.  Not enough fluid in the amniotic sac (oligohydramnios).  Sensitivity to bright lights.  Belly (abdominal) pain. DIAGNOSIS  If protein is found in the urine in the second half of pregnancy, this is considered preeclampsia. Other symptoms mentioned above may also be present. TREATMENT  It is necessary to treat this.  Your caregiver may prescribe bed rest early in this condition. Plenty of rest and  salt restriction may be all that is needed.  Medicines may be necessary to lower blood pressure if the condition does not respond to more conservative measures.  In more severe cases, hospitalization may be needed:  For treatment of blood pressure.  To control fluid retention.  To monitor the baby to see if the condition is causing harm to the baby.  Hospitalization is the best way to treat the first sign of preeclampsia. This is so the mother and baby can be watched closely and blood tests can be done effectively and correctly.  If the condition becomes severe, it may be necessary to induce labor or to remove the infant by surgical means (cesarean section). The best cure for preeclampsia/eclampsia is to deliver the baby. Preeclampsia and eclampsia involve risks to mother and infant. Your caregiver will discuss these risks with you. Together, you can work out the best possible approach to your problems. Make sure you keep your prenatal visits as scheduled. Not keeping appointments could result in a chronic or permanent injury, pain, disability to you, and death or injury to you or your unborn baby. If there is any problem keeping the appointment, you must call to reschedule. HOME CARE INSTRUCTIONS   Keep your prenatal appointments and tests as scheduled.  Tell your caregiver if you have any of the above risk factors.  Get plenty of rest and sleep.  Eat a balanced diet that is low in salt, and do not add salt to your food.  Avoid stressful situations.  Only take over-the-counter and prescriptions medicines for pain, discomfort, or fever as directed by your caregiver. SEEK IMMEDIATE MEDICAL CARE IF:   You develop severe swelling   anywhere in the body. This usually occurs in the legs.  You gain 5 lb/2.3 kg or more in a week.  You develop a severe headache, dizziness, problems with your vision, or confusion.  You have abdominal pain, nausea, or vomiting.  You have a seizure.  You  have trouble moving any part of your body, or you develop numbness or problems speaking.  You have bruising or abnormal bleeding from anywhere in the body.  You develop a stiff neck.  You pass out. MAKE SURE YOU:   Understand these instructions.  Will watch your condition.  Will get help right away if you are not doing well or get worse. Document Released: 06/15/2000 Document Revised: 09/10/2011 Document Reviewed: 01/30/2008 [employer] Patient Information 2014 [employer].  Breastfeeding A change in hormones during your pregnancy causes growth of your breast tissue and an increase in number and size of milk ducts. The hormone prolactin allows proteins, sugars, and fats from your blood supply to make breast milk in your milk-producing glands. The hormone progesterone prevents breast milk from being released before the birth of your baby. After the birth of your baby, your progesterone level decreases allowing breast milk to be released. Thoughts of your baby, as well as his or her sucking or crying, can stimulate the release of milk from the milk-producing glands. Deciding to breastfeed (nurse) is one of the best choices you can make for you and your baby. The information that follows gives a brief review of the benefits, as well as other important skills to know about breastfeeding. BENEFITS OF BREASTFEEDING For your baby  The first milk (colostrum) helps your baby's digestive system function better.   There are antibodies in your milk that help your baby fight off infections.   Your baby has a lower incidence of asthma, allergies, and sudden infant death syndrome (SIDS).   The nutrients in breast milk are better for your baby than infant formulas.  Breast milk improves your baby's brain development.   Your baby will have less gas, colic, and constipation.  Your baby is less likely to develop other conditions, such as childhood obesity, asthma, or diabetes mellitus. For  you  Breastfeeding helps develop a very special bond between you and your baby.   Breastfeeding is convenient, always available at the correct temperature, and costs nothing.   Breastfeeding helps to burn calories and helps you lose the weight gained during pregnancy.   Breastfeeding makes your uterus contract back down to normal size faster and slows bleeding following delivery.   Breastfeeding mothers have a lower risk of developing osteoporosis or breast or ovarian cancer later in life.  BREASTFEEDING FREQUENCY  A healthy, full-term baby may breastfeed as often as every hour or space his or her feedings to every 3 hours. Breastfeeding frequency will vary from baby to baby.   Newborns should be fed no less than every 2 3 hours during the day and every 4 5 hours during the night. You should breastfeed a minimum of 8 feedings in a 24 hour period.  Awaken your baby to breastfeed if it has been 3 4 hours since the last feeding.  Breastfeed when you feel the need to reduce the fullness of your breasts or when your newborn shows signs of hunger. Signs that your baby may be hungry include:  Increased alertness or activity.  Stretching.  Movement of the head from side to side.  Movement of the head and opening of the mouth when the corner of   the mouth or cheek is stroked (rooting).  Increased sucking sounds, smacking lips, cooing, sighing, or squeaking.  Hand-to-mouth movements.  Increased sucking of fingers or hands.  Fussing.  Intermittent crying.  Signs of extreme hunger will require calming and consoling before you try to feed your baby. Signs of extreme hunger may include:  Restlessness.  A loud, strong cry.  Screaming.  Frequent feeding will help you make more milk and will help prevent problems, such as sore nipples and engorgement of the breasts.  BREASTFEEDING   Whether lying down or sitting, be sure that the baby's abdomen is facing your abdomen.    Support your breast with 4 fingers under your breast and your thumb above your nipple. Make sure your fingers are well away from your nipple and your baby's mouth.   Stroke your baby's lips gently with your finger or nipple.   When your baby's mouth is open wide enough, place all of your nipple and as much of the colored area around your nipple (areola) as possible into your baby's mouth.  More areola should be visible above his or her upper lip than below his or her lower lip.  Your baby's tongue should be between his or her lower gum and your breast.  Ensure that your baby's mouth is correctly positioned around the nipple (latched). Your baby's lips should create a seal on your breast.  Signs that your baby has effectively latched onto your nipple include:  Tugging or sucking without pain.  Swallowing heard between sucks.  Absent click or smacking sound.  Muscle movement above and in front of his or her ears with sucking.  Your baby must suck about 2 3 minutes in order to get your milk. Allow your baby to feed on each breast as long as he or she wants. Nurse your baby until he or she unlatches or falls asleep at the first breast, then offer the second breast.  Signs that your baby is full and satisfied include:  A gradual decrease in the number of sucks or complete cessation of sucking.  Falling asleep.  Extension or relaxation of his or her body.  Retention of a small amount of milk in his or her mouth.  Letting go of your breast by himself or herself.  Signs of effective breastfeeding in you include:  Breasts that have increased firmness, weight, and size prior to feeding.  Breasts that are softer after nursing.  Increased milk volume, as well as a change in milk consistency and color by the 5th day of breastfeeding.  Breast fullness relieved by breastfeeding.  Nipples are not sore, cracked, or bleeding.  If needed, break the suction by putting your finger  into the corner of your baby's mouth and sliding your finger between his or her gums. Then, remove your breast from his or her mouth.  It is common for babies to spit up a small amount after a feeding.  Babies often swallow air during feeding. This can make babies fussy. Burping your baby between breasts can help with this.  Vitamin D supplements are recommended for babies who get only breast milk.  Avoid using a pacifier during your baby's first 4 6 weeks.  Avoid supplemental feedings of water, formula, or juice in place of breastfeeding. Breast milk is all the food your baby needs. It is not necessary for your baby to have water or formula. Your breasts will make more milk if supplemental feedings are avoided during the early weeks. HOW TO   TELL WHETHER YOUR BABY IS GETTING ENOUGH BREAST MILK Wondering whether or not your baby is getting enough milk is a common concern among mothers. You can be assured that your baby is getting enough milk if:   Your baby is actively sucking and you hear swallowing.   Your baby seems relaxed and satisfied after a feeding.   Your baby nurses at least 8 12 times in a 24 hour time period.  During the first 3 5 days of age:  Your baby is wetting at least 3 5 diapers in a 24 hour period. The urine should be clear and pale yellow.  Your baby is having at least 3 4 stools in a 24 hour period. The stool should be soft and yellow.  At 5 7 days of age, your baby is having at least 3 6 stools in a 24 hour period. The stool should be seedy and yellow by 5 days of age.  Your baby has a weight loss less than 7 10% during the first 3 days of age.  Your baby does not lose weight after 3 7 days of age.  Your baby gains 4 7 ounces each week after he or she is 4 days of age.  Your baby gains weight by 5 days of age and is back to birth weight within 2 weeks. ENGORGEMENT In the first week after your baby is born, you may experience extremely full breasts  (engorgement). When engorged, your breasts may feel heavy, warm, or tender to the touch. Engorgement peaks within 24 48 hours after delivery of your baby.  Engorgement may be reduced by:  Continuing to breastfeed.  Increasing the frequency of breastfeeding.  Taking warm showers or applying warm, moist heat to your breasts just before each feeding. This increases circulation and helps the milk flow.   Gently massaging your breast before and during the feedings. With your fingertips, massage from your chest wall towards your nipple in a circular motion.   Ensuring that your baby empties at least one breast at every feeding. It also helps to start the next feeding on the opposite breast.   Expressing breast milk by hand or by using a breast pump to empty the breasts if your baby is sleepy, or not nursing well. You may also want to express milk if you are returning to work oryou feel you are getting engorged.  Ensuring your baby is latched on and positioned properly while breastfeeding. If you follow these suggestions, your engorgement should improve in 24 48 hours. If you are still experiencing difficulty, call your lactation consultant or caregiver.  CARING FOR YOURSELF Take care of your breasts.  Bathe or shower daily.   Avoid using soap on your nipples.   Wear a supportive bra. Avoid wearing underwire style bras.  Air dry your nipples for a 3 4minutes after each feeding.   Use only cotton bra pads to absorb breast milk leakage. Leaking of breast milk between feedings is normal.   Use only pure lanolin on your nipples after nursing. You do not need to wash it off before feeding your baby again. Another option is to express a few drops of breast milk and gently massage that milk into your nipples.  Continue breast self-awareness checks. Take care of yourself.  Eat healthy foods. Alternate 3 meals with 3 snacks.  Avoid foods that you notice affect your baby in a bad  way.  Drink milk, fruit juice, and water to satisfy your thirst (about 8   glasses a day).   Rest often, relax, and take your prenatal vitamins to prevent fatigue, stress, and anemia.  Avoid chewing and smoking tobacco.  Avoid alcohol and drug use.  Take over-the-counter and prescribed medicine only as directed by your caregiver or pharmacist. You should always check with your caregiver or pharmacist before taking any new medicine, vitamin, or herbal supplement.  Know that pregnancy is possible while breastfeeding. If desired, talk to your caregiver about family planning and safe birth control methods that may be used while breastfeeding. SEEK MEDICAL CARE IF:   You feel like you want to stop breastfeeding or have become frustrated with breastfeeding.  You have painful breasts or nipples.  Your nipples are cracked or bleeding.  Your breasts are red, tender, or warm.  You have a swollen area on either breast.  You have a fever or chills.  You have nausea or vomiting.  You have drainage from your nipples.  Your breasts do not become full before feedings by the 5th day after delivery.  You feel sad and depressed.  Your baby is too sleepy to eat well.  Your baby is having trouble sleeping.   Your baby is wetting less than 3 diapers in a 24 hour period.  Your baby has less than 3 stools in a 24 hour period.  Your baby's skin or the white part of his or her eyes becomes more yellow.   Your baby is not gaining weight by 5 days of age. MAKE SURE YOU:   Understand these instructions.  Will watch your condition.  Will get help right away if you are not doing well or get worse. Document Released: 06/18/2005 Document Revised: 03/12/2012 Document Reviewed: 01/23/2012 [employer] Patient Information 2014 [employer].  

## 2013-01-07 NOTE — Progress Notes (Signed)
P = [phone] 

## 2013-01-09 ENCOUNTER — Other Ambulatory Visit: Payer: Self-pay | Admitting: Family Medicine

## 2013-01-09 DIAGNOSIS — O24919 Unspecified diabetes mellitus in pregnancy, unspecified trimester: Secondary | ICD-10-CM

## 2013-01-12 ENCOUNTER — Ambulatory Visit (INDEPENDENT_AMBULATORY_CARE_PROVIDER_SITE_OTHER): Payer: Medicaid Other | Admitting: Family Medicine

## 2013-01-12 VITALS — BP 144/83 | Wt 199.0 lb

## 2013-01-12 DIAGNOSIS — O10013 Pre-existing essential hypertension complicating pregnancy, third trimester: Secondary | ICD-10-CM

## 2013-01-12 DIAGNOSIS — Z3480 Encounter for supervision of other normal pregnancy, unspecified trimester: Secondary | ICD-10-CM

## 2013-01-12 DIAGNOSIS — O10019 Pre-existing essential hypertension complicating pregnancy, unspecified trimester: Secondary | ICD-10-CM

## 2013-01-12 NOTE — Progress Notes (Signed)
NST reviewed and reactive. U/S scheduled Thurs.

## 2013-01-12 NOTE — Patient Instructions (Addendum)
Preeclampsia and Eclampsia Preeclampsia is a condition of high blood pressure during pregnancy. It can happen at 20 weeks or later in pregnancy. If high blood pressure occurs in the second half of pregnancy with no other symptoms, it is called gestational hypertension and goes away after the baby is born. If any of the symptoms listed below develop with gestational hypertension, it is then called preeclampsia. Eclampsia (convulsions) may follow preeclampsia. This is one of the reasons for regular prenatal checkups. Early diagnosis and treatment are very important to prevent eclampsia. CAUSES  There is no known cause of preeclampsia/eclampsia in pregnancy. There are several known conditions that may put the pregnant woman at risk, such as:  The first pregnancy.  Having preeclampsia in a past pregnancy.  Having lasting (chronic) high blood pressure.  Having multiples (twins, triplets).  Being age 35 or older.  African American ethnic background.  Having kidney disease or diabetes.  Medical conditions such as lupus or blood diseases.  Being overweight (obese). SYMPTOMS   High blood pressure.  Headaches.  Sudden weight gain.  Swelling of hands, face, legs, and feet.  Protein in the urine.  Feeling sick to your stomach (nauseous) and throwing up (vomiting).  Vision problems (blurred or double vision).  Numbness in the face, arms, legs, and feet.  Dizziness.  Slurred speech.  Preeclampsia can cause growth retardation in the fetus.  Separation (abruption) of the placenta.  Not enough fluid in the amniotic sac (oligohydramnios).  Sensitivity to bright lights.  Belly (abdominal) pain. DIAGNOSIS  If protein is found in the urine in the second half of pregnancy, this is considered preeclampsia. Other symptoms mentioned above may also be present. TREATMENT  It is necessary to treat this.  Your caregiver may prescribe bed rest early in this condition. Plenty of rest and  salt restriction may be all that is needed.  Medicines may be necessary to lower blood pressure if the condition does not respond to more conservative measures.  In more severe cases, hospitalization may be needed:  For treatment of blood pressure.  To control fluid retention.  To monitor the baby to see if the condition is causing harm to the baby.  Hospitalization is the best way to treat the first sign of preeclampsia. This is so the mother and baby can be watched closely and blood tests can be done effectively and correctly.  If the condition becomes severe, it may be necessary to induce labor or to remove the infant by surgical means (cesarean section). The best cure for preeclampsia/eclampsia is to deliver the baby. Preeclampsia and eclampsia involve risks to mother and infant. Your caregiver will discuss these risks with you. Together, you can work out the best possible approach to your problems. Make sure you keep your prenatal visits as scheduled. Not keeping appointments could result in a chronic or permanent injury, pain, disability to you, and death or injury to you or your unborn baby. If there is any problem keeping the appointment, you must call to reschedule. HOME CARE INSTRUCTIONS   Keep your prenatal appointments and tests as scheduled.  Tell your caregiver if you have any of the above risk factors.  Get plenty of rest and sleep.  Eat a balanced diet that is low in salt, and do not add salt to your food.  Avoid stressful situations.  Only take over-the-counter and prescriptions medicines for pain, discomfort, or fever as directed by your caregiver. SEEK IMMEDIATE MEDICAL CARE IF:   You develop severe swelling   anywhere in the body. This usually occurs in the legs.  You gain 5 lb/2.3 kg or more in a week.  You develop a severe headache, dizziness, problems with your vision, or confusion.  You have abdominal pain, nausea, or vomiting.  You have a seizure.  You  have trouble moving any part of your body, or you develop numbness or problems speaking.  You have bruising or abnormal bleeding from anywhere in the body.  You develop a stiff neck.  You pass out. MAKE SURE YOU:   Understand these instructions.  Will watch your condition.  Will get help right away if you are not doing well or get worse. Document Released: 06/15/2000 Document Revised: 09/10/2011 Document Reviewed: 01/30/2008 [employer] Patient Information 2014 [employer].  Pregnancy - Third Trimester The third trimester of pregnancy (the last 3 months) is a period of the most rapid growth for you and your baby. The baby approaches a length of 20 inches and a weight of 6 to 10 pounds. The baby is adding on fat and getting ready for life outside your body. While inside, babies have periods of sleeping and waking, sucking thumbs, and hiccuping. You can often feel small contractions of the uterus. This is false labor. It is also called Braxton-Hicks contractions. This is like a practice for labor. The usual problems in this stage of pregnancy include more difficulty breathing, swelling of the hands and feet from water retention, and having to urinate more often because of the uterus and baby pressing on your bladder.  PRENATAL EXAMS  Blood work may continue to be done during prenatal exams. These tests are done to check on your health and the probable health of your baby. Blood work is used to follow your blood levels (hemoglobin). Anemia (low hemoglobin) is common during pregnancy. Iron and vitamins are given to help prevent this. You may also continue to be checked for diabetes. Some of the past blood tests may be done again.  The size of the uterus is measured during each visit. This makes sure your baby is growing properly according to your pregnancy dates.  Your blood pressure is checked every prenatal visit. This is to make sure you are not getting toxemia.  Your urine is checked  every prenatal visit for infection, diabetes, and protein.  Your weight is checked at each visit. This is done to make sure gains are happening at the suggested rate and that you and your baby are growing normally.  Sometimes, an ultrasound is performed to confirm the position and the proper growth and development of the baby. This is a test done that bounces harmless sound waves off the baby so your caregiver can more accurately determine a due date.  Discuss the type of pain medicine and anesthesia you will have during your labor and delivery.  Discuss the possibility and anesthesia if a cesarean section might be necessary.  Inform your caregiver if there is any mental or physical violence at home. Sometimes, a specialized non-stress test, contraction stress test, and biophysical profile are done to make sure the baby is not having a problem. Checking the amniotic fluid surrounding the baby is called an amniocentesis. The amniotic fluid is removed by sticking a needle into the belly (abdomen). This is sometimes done near the end of pregnancy if an early delivery is required. In this case, it is done to help make sure the baby's lungs are mature enough for the baby to live outside of the womb. If the   lungs are not mature and it is unsafe to deliver the baby, an injection of cortisone medicine is given to the mother 1 to 2 days before the delivery. This helps the baby's lungs mature and makes it safer to deliver the baby. CHANGES OCCURING IN THE THIRD TRIMESTER OF PREGNANCY Your body goes through many changes during pregnancy. They vary from person to person. Talk to your caregiver about changes you notice and are concerned about.  During the last trimester, you have probably had an increase in your appetite. It is normal to have cravings for certain foods. This varies from person to person and pregnancy to pregnancy.  You may begin to get stretch marks on your hips, abdomen, and breasts. These are  normal changes in the body during pregnancy. There are no exercises or medicines to take which prevent this change.  Constipation may be treated with a stool softener or adding bulk to your diet. Drinking lots of fluids, fiber in vegetables, fruits, and whole grains are helpful.  Exercising is also helpful. If you have been very active up until your pregnancy, most of these activities can be continued during your pregnancy. If you have been less active, it is helpful to start an exercise program such as walking. Consult your caregiver before starting exercise programs.  Avoid all smoking, alcohol, non-prescribed drugs, herbs and "street drugs" during your pregnancy. These chemicals affect the formation and growth of the baby. Avoid chemicals throughout the pregnancy to ensure the delivery of a healthy infant.  Backache, varicose veins, and hemorrhoids may develop or get worse.  You will tire more easily in the third trimester, which is normal.  The baby's movements may be stronger and more often.  You may become short of breath easily.  Your belly button may stick out.  A yellow discharge may leak from your breasts called colostrum.  You may have a bloody mucus discharge. This usually occurs a few days to a week before labor begins. HOME CARE INSTRUCTIONS   Keep your caregiver's appointments. Follow your caregiver's instructions regarding medicine use, exercise, and diet.  During pregnancy, you are providing food for you and your baby. Continue to eat regular, well-balanced meals. Choose foods such as meat, fish, milk and other low fat dairy products, vegetables, fruits, and whole-grain breads and cereals. Your caregiver will tell you of the ideal weight gain.  A physical sexual relationship may be continued throughout pregnancy if there are no other problems such as early (premature) leaking of amniotic fluid from the membranes, vaginal bleeding, or belly (abdominal) pain.  Exercise  regularly if there are no restrictions. Check with your caregiver if you are unsure of the safety of your exercises. Greater weight gain will occur in the last 2 trimesters of pregnancy. Exercising helps:  Control your weight.  Get you in shape for labor and delivery.  You lose weight after you deliver.  Rest a lot with legs elevated, or as needed for leg cramps or low back pain.  Wear a good support or jogging bra for breast tenderness during pregnancy. This may help if worn during sleep. Pads or tissues may be used in the bra if you are leaking colostrum.  Do not use hot tubs, steam rooms, or saunas.  Wear your seat belt when driving. This protects you and your baby if you are in an accident.  Avoid raw meat, cat litter boxes and soil used by cats. These carry germs that can cause birth defects in the baby.    It is easier to leak urine during pregnancy. Tightening up and strengthening the pelvic muscles will help with this problem. You can practice stopping your urination while you are going to the bathroom. These are the same muscles you need to strengthen. It is also the muscles you would use if you were trying to stop from passing gas. You can practice tightening these muscles up 10 times a set and repeating this about 3 times per day. Once you know what muscles to tighten up, do not perform these exercises during urination. It is more likely to cause an infection by backing up the urine.  Ask for help if you have financial, counseling, or nutritional needs during pregnancy. Your caregiver will be able to offer counseling for these needs as well as refer you for other special needs.  Make a list of emergency phone numbers and have them available.  Plan on getting help from family or friends when you go home from the hospital.  Make a trial run to the hospital.  Take prenatal classes with the father to understand, practice, and ask questions about the labor and delivery.  Prepare the  baby's room or nursery.  Do not travel out of the city unless it is absolutely necessary and with the advice of your caregiver.  Wear only low or no heal shoes to have better balance and prevent falling. MEDICINES AND DRUG USE IN PREGNANCY  Take prenatal vitamins as directed. The vitamin should contain 1 milligram of folic acid. Keep all vitamins out of reach of children. Only a couple vitamins or tablets containing iron may be fatal to a baby or young child when ingested.  Avoid use of all medicines, including herbs, over-the-counter medicines, not prescribed or suggested by your caregiver. Only take over-the-counter or prescription medicines for pain, discomfort, or fever as directed by your caregiver. Do not use aspirin, ibuprofen or naproxen unless approved by your caregiver.  Let your caregiver also know about herbs you may be using.  Alcohol is related to a number of birth defects. This includes fetal alcohol syndrome. All alcohol, in any form, should be avoided completely. Smoking will cause low birth rate and premature babies.  Illegal drugs are very harmful to the baby. They are absolutely forbidden. A baby born to an addicted mother will be addicted at birth. The baby will go through the same withdrawal an adult does. SEEK MEDICAL CARE IF: You have any concerns or worries during your pregnancy. It is better to call with your questions if you feel they cannot wait, rather than worry about them. SEEK IMMEDIATE MEDICAL CARE IF:   An unexplained oral temperature above 102 F (38.9 C) develops, or as your caregiver suggests.  You have leaking of fluid from the vagina. If leaking membranes are suspected, take your temperature and tell your caregiver of this when you call.  There is vaginal spotting, bleeding or passing clots. Tell your caregiver of the amount and how many pads are used.  You develop a bad smelling vaginal discharge with a change in the color from clear to white.  You  develop vomiting that lasts more than 24 hours.  You develop chills or fever.  You develop shortness of breath.  You develop burning on urination.  You loose more than 2 pounds of weight or gain more than 2 pounds of weight or as suggested by your caregiver.  You notice sudden swelling of your face, hands, and feet or legs.  You develop belly (abdominal)   pain. Round ligament discomfort is a common non-cancerous (benign) cause of abdominal pain in pregnancy. Your caregiver still must evaluate you.  You develop a severe headache that does not go away.  You develop visual problems, blurred or double vision.  If you have not felt your baby move for more than 1 hour. If you think the baby is not moving as much as usual, eat something with sugar in it and lie down on your left side for an hour. The baby should move at least 4 to 5 times per hour. Call right away if your baby moves less than that.  You fall, are in a car accident, or any kind of trauma.  There is mental or physical violence at home. Document Released: 06/12/2001 Document Revised: 03/12/2012 Document Reviewed: 12/15/2008 [employer] Patient Information 2014 [employer].  Breastfeeding A change in hormones during your pregnancy causes growth of your breast tissue and an increase in number and size of milk ducts. The hormone prolactin allows proteins, sugars, and fats from your blood supply to make breast milk in your milk-producing glands. The hormone progesterone prevents breast milk from being released before the birth of your baby. After the birth of your baby, your progesterone level decreases allowing breast milk to be released. Thoughts of your baby, as well as his or her sucking or crying, can stimulate the release of milk from the milk-producing glands. Deciding to breastfeed (nurse) is one of the best choices you can make for you and your baby. The information that follows gives a brief review of the benefits, as well as  other important skills to know about breastfeeding. BENEFITS OF BREASTFEEDING For your baby  The first milk (colostrum) helps your baby's digestive system function better.   There are antibodies in your milk that help your baby fight off infections.   Your baby has a lower incidence of asthma, allergies, and sudden infant death syndrome (SIDS).   The nutrients in breast milk are better for your baby than infant formulas.  Breast milk improves your baby's brain development.   Your baby will have less gas, colic, and constipation.  Your baby is less likely to develop other conditions, such as childhood obesity, asthma, or diabetes mellitus. For you  Breastfeeding helps develop a very special bond between you and your baby.   Breastfeeding is convenient, always available at the correct temperature, and costs nothing.   Breastfeeding helps to burn calories and helps you lose the weight gained during pregnancy.   Breastfeeding makes your uterus contract back down to normal size faster and slows bleeding following delivery.   Breastfeeding mothers have a lower risk of developing osteoporosis or breast or ovarian cancer later in life.  BREASTFEEDING FREQUENCY  A healthy, full-term baby may breastfeed as often as every hour or space his or her feedings to every 3 hours. Breastfeeding frequency will vary from baby to baby.   Newborns should be fed no less than every 2 3 hours during the day and every 4 5 hours during the night. You should breastfeed a minimum of 8 feedings in a 24 hour period.  Awaken your baby to breastfeed if it has been 3 4 hours since the last feeding.  Breastfeed when you feel the need to reduce the fullness of your breasts or when your newborn shows signs of hunger. Signs that your baby may be hungry include:  Increased alertness or activity.  Stretching.  Movement of the head from side to side.  Movement   of the head and opening of the mouth when  the corner of the mouth or cheek is stroked (rooting).  Increased sucking sounds, smacking lips, cooing, sighing, or squeaking.  Hand-to-mouth movements.  Increased sucking of fingers or hands.  Fussing.  Intermittent crying.  Signs of extreme hunger will require calming and consoling before you try to feed your baby. Signs of extreme hunger may include:  Restlessness.  A loud, strong cry.  Screaming.  Frequent feeding will help you make more milk and will help prevent problems, such as sore nipples and engorgement of the breasts.  BREASTFEEDING   Whether lying down or sitting, be sure that the baby's abdomen is facing your abdomen.   Support your breast with 4 fingers under your breast and your thumb above your nipple. Make sure your fingers are well away from your nipple and your baby's mouth.   Stroke your baby's lips gently with your finger or nipple.   When your baby's mouth is open wide enough, place all of your nipple and as much of the colored area around your nipple (areola) as possible into your baby's mouth.  More areola should be visible above his or her upper lip than below his or her lower lip.  Your baby's tongue should be between his or her lower gum and your breast.  Ensure that your baby's mouth is correctly positioned around the nipple (latched). Your baby's lips should create a seal on your breast.  Signs that your baby has effectively latched onto your nipple include:  Tugging or sucking without pain.  Swallowing heard between sucks.  Absent click or smacking sound.  Muscle movement above and in front of his or her ears with sucking.  Your baby must suck about 2 3 minutes in order to get your milk. Allow your baby to feed on each breast as long as he or she wants. Nurse your baby until he or she unlatches or falls asleep at the first breast, then offer the second breast.  Signs that your baby is full and satisfied include:  A gradual  decrease in the number of sucks or complete cessation of sucking.  Falling asleep.  Extension or relaxation of his or her body.  Retention of a small amount of milk in his or her mouth.  Letting go of your breast by himself or herself.  Signs of effective breastfeeding in you include:  Breasts that have increased firmness, weight, and size prior to feeding.  Breasts that are softer after nursing.  Increased milk volume, as well as a change in milk consistency and color by the 5th day of breastfeeding.  Breast fullness relieved by breastfeeding.  Nipples are not sore, cracked, or bleeding.  If needed, break the suction by putting your finger into the corner of your baby's mouth and sliding your finger between his or her gums. Then, remove your breast from his or her mouth.  It is common for babies to spit up a small amount after a feeding.  Babies often swallow air during feeding. This can make babies fussy. Burping your baby between breasts can help with this.  Vitamin D supplements are recommended for babies who get only breast milk.  Avoid using a pacifier during your baby's first 4 6 weeks.  Avoid supplemental feedings of water, formula, or juice in place of breastfeeding. Breast milk is all the food your baby needs. It is not necessary for your baby to have water or formula. Your breasts will make more   milk if supplemental feedings are avoided during the early weeks. HOW TO TELL WHETHER YOUR BABY IS GETTING ENOUGH BREAST MILK Wondering whether or not your baby is getting enough milk is a common concern among mothers. You can be assured that your baby is getting enough milk if:   Your baby is actively sucking and you hear swallowing.   Your baby seems relaxed and satisfied after a feeding.   Your baby nurses at least 8 12 times in a 24 hour time period.  During the first 3 5 days of age:  Your baby is wetting at least 3 5 diapers in a 24 hour period. The urine should  be clear and pale yellow.  Your baby is having at least 3 4 stools in a 24 hour period. The stool should be soft and yellow.  At 5 7 days of age, your baby is having at least 3 6 stools in a 24 hour period. The stool should be seedy and yellow by 5 days of age.  Your baby has a weight loss less than 7 10% during the first 3 days of age.  Your baby does not lose weight after 3 7 days of age.  Your baby gains 4 7 ounces each week after he or she is 4 days of age.  Your baby gains weight by 5 days of age and is back to birth weight within 2 weeks. ENGORGEMENT In the first week after your baby is born, you may experience extremely full breasts (engorgement). When engorged, your breasts may feel heavy, warm, or tender to the touch. Engorgement peaks within 24 48 hours after delivery of your baby.  Engorgement may be reduced by:  Continuing to breastfeed.  Increasing the frequency of breastfeeding.  Taking warm showers or applying warm, moist heat to your breasts just before each feeding. This increases circulation and helps the milk flow.   Gently massaging your breast before and during the feedings. With your fingertips, massage from your chest wall towards your nipple in a circular motion.   Ensuring that your baby empties at least one breast at every feeding. It also helps to start the next feeding on the opposite breast.   Expressing breast milk by hand or by using a breast pump to empty the breasts if your baby is sleepy, or not nursing well. You may also want to express milk if you are returning to work oryou feel you are getting engorged.  Ensuring your baby is latched on and positioned properly while breastfeeding. If you follow these suggestions, your engorgement should improve in 24 48 hours. If you are still experiencing difficulty, call your lactation consultant or caregiver.  CARING FOR YOURSELF Take care of your breasts.  Bathe or shower daily.   Avoid using soap on  your nipples.   Wear a supportive bra. Avoid wearing underwire style bras.  Air dry your nipples for a 3 4minutes after each feeding.   Use only cotton bra pads to absorb breast milk leakage. Leaking of breast milk between feedings is normal.   Use only pure lanolin on your nipples after nursing. You do not need to wash it off before feeding your baby again. Another option is to express a few drops of breast milk and gently massage that milk into your nipples.  Continue breast self-awareness checks. Take care of yourself.  Eat healthy foods. Alternate 3 meals with 3 snacks.  Avoid foods that you notice affect your baby in a bad way.    Drink milk, fruit juice, and water to satisfy your thirst (about 8 glasses a day).   Rest often, relax, and take your prenatal vitamins to prevent fatigue, stress, and anemia.  Avoid chewing and smoking tobacco.  Avoid alcohol and drug use.  Take over-the-counter and prescribed medicine only as directed by your caregiver or pharmacist. You should always check with your caregiver or pharmacist before taking any new medicine, vitamin, or herbal supplement.  Know that pregnancy is possible while breastfeeding. If desired, talk to your caregiver about family planning and safe birth control methods that may be used while breastfeeding. SEEK MEDICAL CARE IF:   You feel like you want to stop breastfeeding or have become frustrated with breastfeeding.  You have painful breasts or nipples.  Your nipples are cracked or bleeding.  Your breasts are red, tender, or warm.  You have a swollen area on either breast.  You have a fever or chills.  You have nausea or vomiting.  You have drainage from your nipples.  Your breasts do not become full before feedings by the 5th day after delivery.  You feel sad and depressed.  Your baby is too sleepy to eat well.  Your baby is having trouble sleeping.   Your baby is wetting less than 3 diapers in a 24  hour period.  Your baby has less than 3 stools in a 24 hour period.  Your baby's skin or the white part of his or her eyes becomes more yellow.   Your baby is not gaining weight by 5 days of age. MAKE SURE YOU:   Understand these instructions.  Will watch your condition.  Will get help right away if you are not doing well or get worse. Document Released: 06/18/2005 Document Revised: 03/12/2012 Document Reviewed: 01/23/2012 [employer] Patient Information 2014 [employer].  

## 2013-01-13 ENCOUNTER — Ambulatory Visit: Payer: Medicaid Other | Admitting: *Deleted

## 2013-01-13 VITALS — BP 132/82 | HR 85 | Wt 197.0 lb

## 2013-01-13 DIAGNOSIS — Z3482 Encounter for supervision of other normal pregnancy, second trimester: Secondary | ICD-10-CM

## 2013-01-13 NOTE — Progress Notes (Signed)
Patient walked in wanting her BP checked.  She states she "just didn't feel well".  Vitals checked and patient felt fine to leave.

## 2013-01-15 ENCOUNTER — Ambulatory Visit (HOSPITAL_COMMUNITY)
Admission: RE | Admit: 2013-01-15 | Discharge: 2013-01-15 | Disposition: A | Discharge status: Home / Self Care | Payer: Medicaid Other | Source: Ambulatory Visit | Attending: Family Medicine | Admitting: Family Medicine

## 2013-01-15 VITALS — BP 124/84 | HR 89 | Wt 198.5 lb

## 2013-01-15 DIAGNOSIS — O99333 Smoking (tobacco) complicating pregnancy, third trimester: Secondary | ICD-10-CM

## 2013-01-15 DIAGNOSIS — O24919 Unspecified diabetes mellitus in pregnancy, unspecified trimester: Secondary | ICD-10-CM

## 2013-01-15 DIAGNOSIS — O10019 Pre-existing essential hypertension complicating pregnancy, unspecified trimester: Secondary | ICD-10-CM | POA: Insufficient documentation

## 2013-01-15 DIAGNOSIS — O10013 Pre-existing essential hypertension complicating pregnancy, third trimester: Secondary | ICD-10-CM

## 2013-01-15 DIAGNOSIS — O99330 Smoking (tobacco) complicating pregnancy, unspecified trimester: Secondary | ICD-10-CM | POA: Insufficient documentation

## 2013-01-15 IMAGING — US US OB FOLLOW-UP
1 series · 12 of 28 positions shown · non-contrast
Comparison: none

[Series 1: us ob follow-up · 0.12mm/px · 12 of 33 slices shown]
[im 2/33]
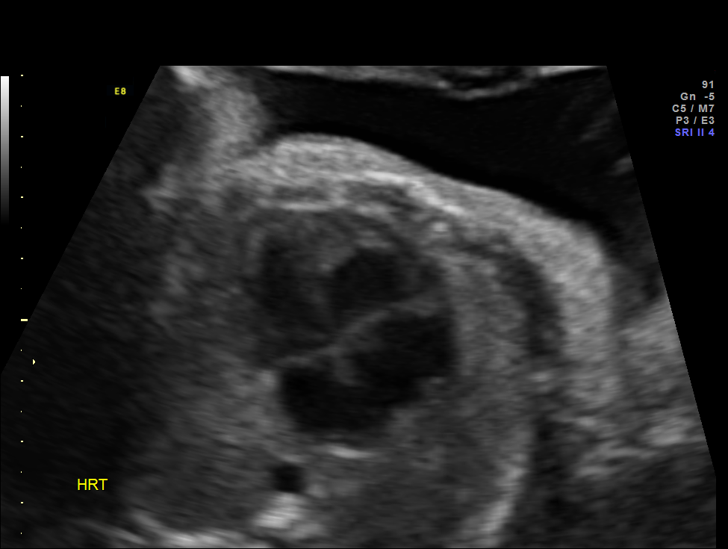
[im 4/33]
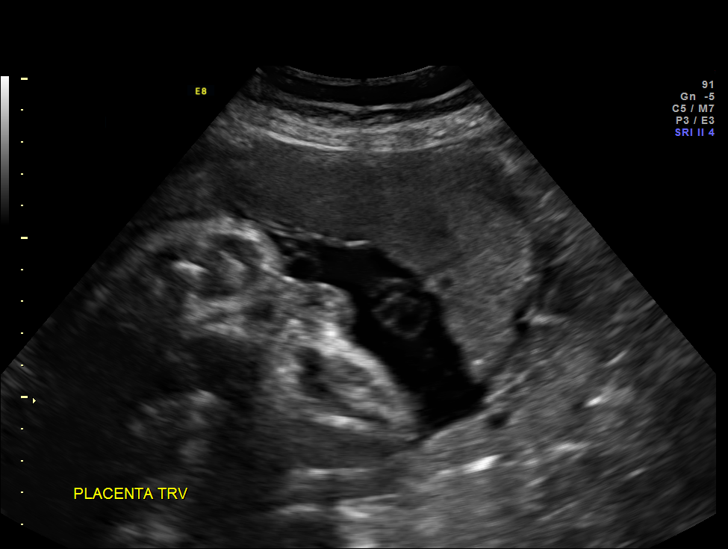
[im 6/33]
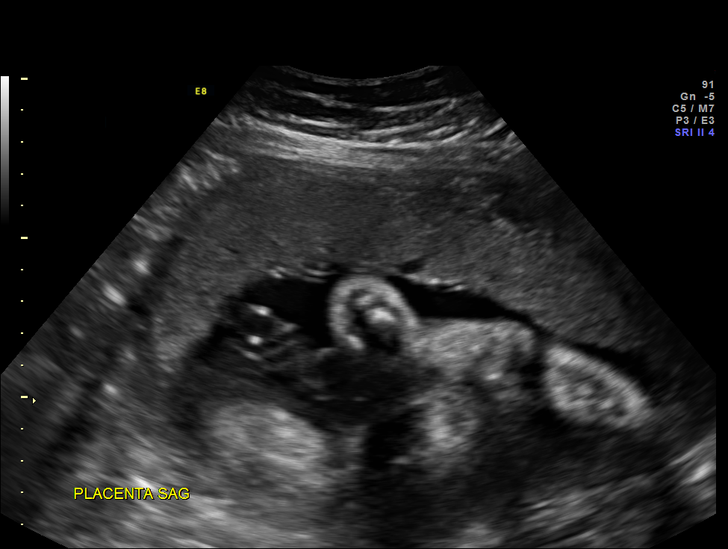
[im 10/33]
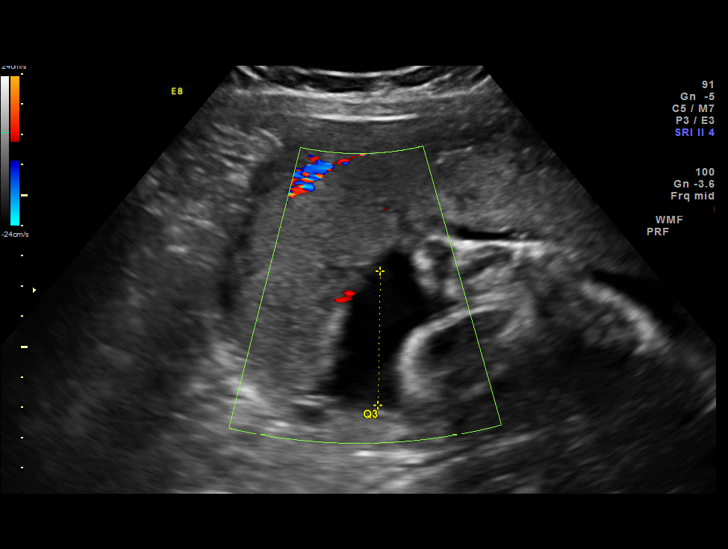
[im 12/33]
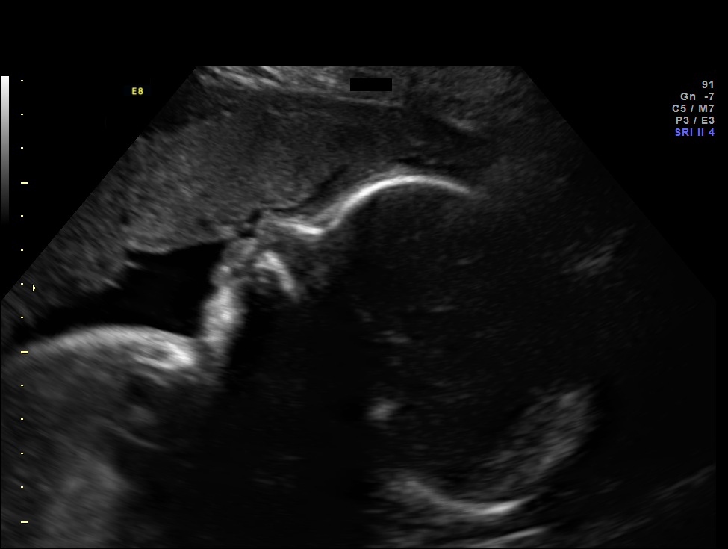
[im 15/33]
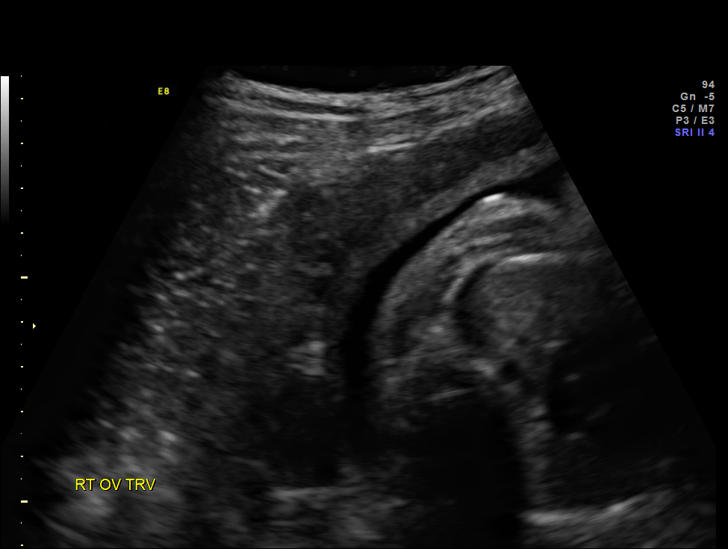
[im 18/33]
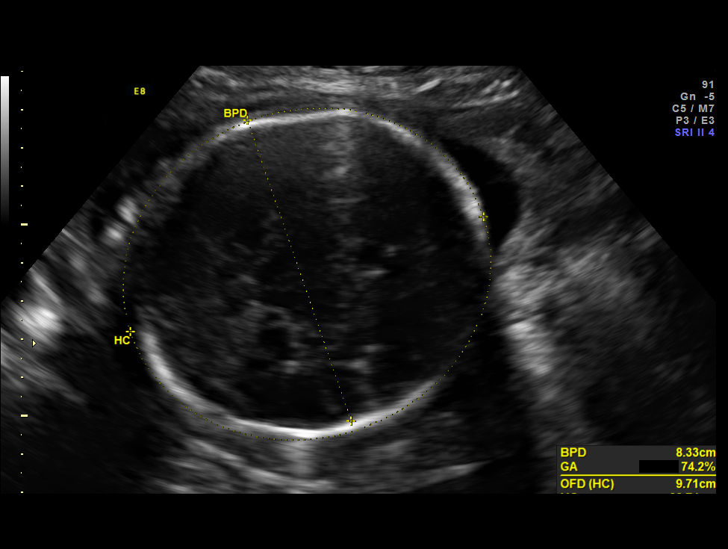
[im 21/33]
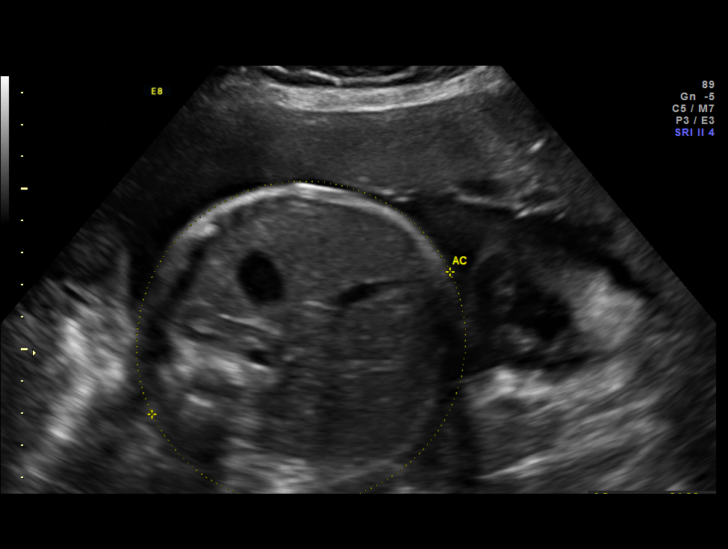
[im 23/33]
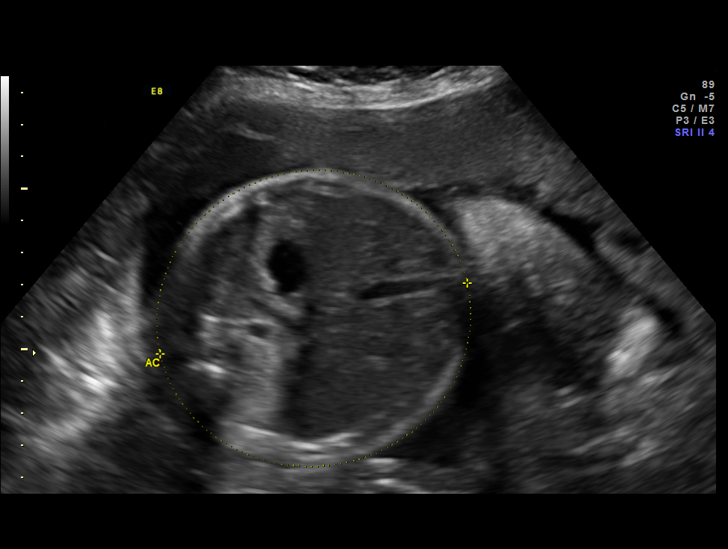
[im 27/33]
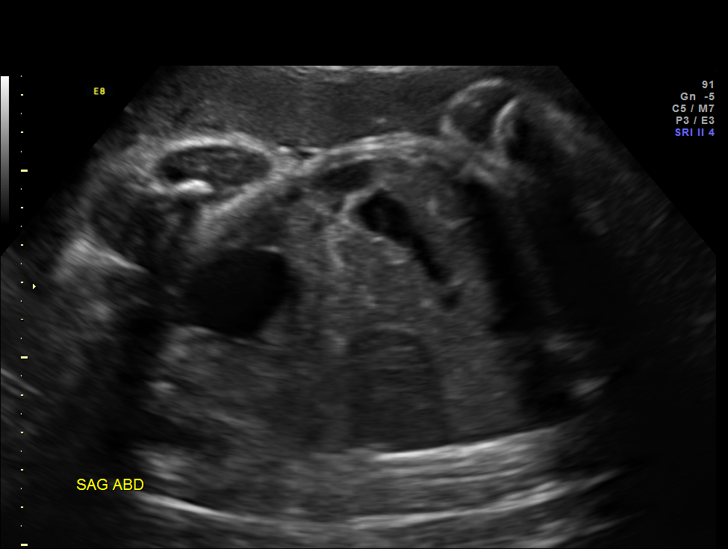
[im 29/33]
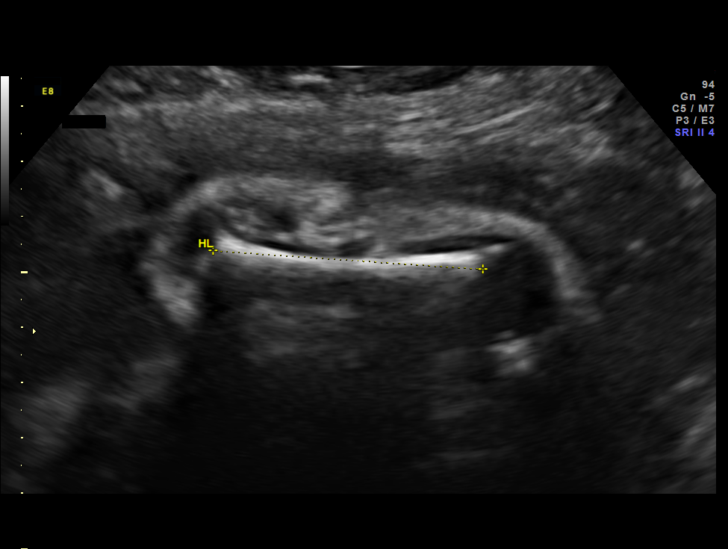
[im 31/33]
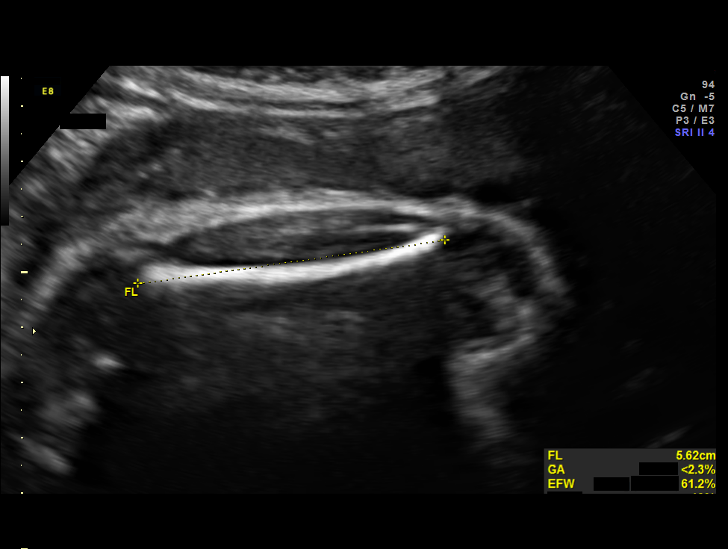

[12 of 28 positions shown; findings below may reference images not displayed]

OBSTETRICS REPORT
                      (Signed Final [DATE] [DATE])

Service(s) Provided

 US OB FOLLOW UP                                       76816.1
Indications

 Hypertension - Chronic/Pre-existing; on Atenolol
 Cigarette smoker
Fetal Evaluation

 Num Of Fetuses:    1
 Fetal Heart Rate:  121                          bpm
 Cardiac Activity:  Observed
 Presentation:      Cephalic
 Placenta:          Anterior, above cervical os
 P. Cord            Previously Visualized
 Insertion:

 Amniotic Fluid
 AFI FV:      Subjectively within normal limits
 AFI Sum:     12.5    cm       36  %Tile     Larg Pckt:    4.43  cm
 RUQ:   1.92    cm   RLQ:    4.23   cm    LUQ:   1.92    cm   LLQ:    4.43   cm
Biometry

 BPD:     82.7  mm     G. Age:  33w 2d                CI:         80.8   70 - 86
 OFD:    102.4  mm                                    FL/HC:      18.8   19.1 -

 HC:       296  mm     G. Age:  32w 5d       21  %    HC/AC:      0.97   0.96 -

 AC:     304.6  mm     G. Age:  34w 3d       93  %    FL/BPD:     67.4   71 - 87
 FL:      55.7  mm     G. Age:  29w 2d      < 3  %    FL/AC:      18.3   20 - 24
 HUM:     48.9  mm     G. Age:  28w 6d      < 5  %

 Est. FW:    [4H]  gm      4 lb 8 oz     63  %
Gestational Age

 LMP:           33w 1d        Date:  [DATE]                 EDD:   [DATE]
 U/S Today:     32w 3d                                        EDD:   [DATE]
 Best:          32w 3d     Det. By:  U/S C R L   ([DATE])   EDD:   [DATE]
Anatomy
 Cranium:          Appears normal         Aortic Arch:      Previously seen
 Fetal Cavum:      Appears normal         Ductal Arch:      Previously seen
 Ventricles:       Appears normal         Diaphragm:        Previously seen
 Choroid Plexus:   Previously seen        Stomach:          Appears normal, left
                                                            sided
 Cerebellum:       Previously seen        Abdomen:          Appears normal
 Posterior Fossa:  Previously seen        Abdominal Wall:   Previously seen
 Nuchal Fold:      Previously seen        Cord Vessels:     Previously seen
 Face:             Orbits and profile     Kidneys:          Appear normal
                   previously seen
 Lips:             Previously seen        Bladder:          Appears normal
 Heart:            Appears normal         Spine:            Previously seen
                   (4CH, axis, and
                   situs)
 RVOT:             Previously seen        Lower             Previously seen
                                          Extremities:
 LVOT:             Previously seen        Upper             Previously seen
                                          Extremities:

 Other:  Male gender. Heels and 5th digit previously visualized.
Cervix Uterus Adnexa

 Cervix:       Not visualized (advanced GA >[4H])
 Left Ovary:    Within normal limits.
 Right Ovary:   Within normal limits.

 Adnexa:     No abnormality visualized.
Impression

 Single IUP at 32 [DATE] weeks
 CHTN, smoking history
 Appropriate interval growth with EFW at the 63rd %tile
 Normal amniotic fluid volume
Recommendations

 Recommend follow-up ultrasound examination in 4 weeks for
 interval growth.
 Recommend 2x weekly NSTs due to CHTN on medications.

## 2013-01-15 NOTE — Progress Notes (Signed)
[name]  was seen today for an ultrasound appointment.  See full report in [facility].  Impression: Single IUP at 32 3/7 weeks CHTN, smoking history Appropriate interval growth with EFW at the 63rd %tile   Normal amniotic fluid volume  Recommendations: Recommend follow-up ultrasound examination in 4 weeks for interval growth. Recommend 2x weekly NSTs due to [facility] on medications.  Alpha Gula, MD

## 2013-01-19 ENCOUNTER — Encounter (HOSPITAL_COMMUNITY): Payer: Self-pay | Admitting: *Deleted

## 2013-01-19 ENCOUNTER — Inpatient Hospital Stay (HOSPITAL_COMMUNITY)
Admission: AD | Admit: 2013-01-19 | Discharge: 2013-01-19 | Disposition: A | Discharge status: Home / Self Care | Payer: Medicaid Other | Source: Ambulatory Visit | Attending: Obstetrics & Gynecology | Admitting: Obstetrics & Gynecology

## 2013-01-19 DIAGNOSIS — O368130 Decreased fetal movements, third trimester, not applicable or unspecified: Secondary | ICD-10-CM

## 2013-01-19 DIAGNOSIS — O368190 Decreased fetal movements, unspecified trimester, not applicable or unspecified: Secondary | ICD-10-CM

## 2013-01-19 DIAGNOSIS — W010XXA Fall on same level from slipping, tripping and stumbling without subsequent striking against object, initial encounter: Secondary | ICD-10-CM | POA: Insufficient documentation

## 2013-01-19 DIAGNOSIS — Y92009 Unspecified place in unspecified non-institutional (private) residence as the place of occurrence of the external cause: Secondary | ICD-10-CM | POA: Insufficient documentation

## 2013-01-19 NOTE — MAU Provider Note (Signed)
History     CSN: [id number]  Arrival date and time: 01/19/13 1810   None     Chief Complaint  Patient presents with  . Fall  . Decreased Fetal Movement   HPI  32 yo [id number] at [redacted]w[redacted]d here today after fall and with decreased fetal movement.   Tripped today carrying groceries into the house at 4:30pm today. Landed on hands and toes. Doesn't think she hit her belly but is unsure. No LOC. Was able to get up immediately and get in the house. Went in the house and called the doctor and was told to watch it.  Then felt like he was moving less than usual and so she got concerned. No abd pain, vb, lof. Some decreased fetal movement. Now seems to be moving normally.   Patient at [facility] and has an appt tomorrow.   OB History   Grav Para Term Preterm Abortions TAB SAB Ect Mult Living   6 2 2  0 3 2 1  0 0 2      Past Medical History  Diagnosis Date  . Hypertension   . Migraine   . Gestational diabetes     with 1st pregnancy  . Urinary tract infection     Past Surgical History  Procedure Laterality Date  . Tonsillectomy  1988  . Tubes in ears  Baby  . Dilation and evacuation  03/09/2011    Procedure: DILATATION AND EVACUATION (D&E);  Surgeon: Reva Bores, MD;  Location: [facility];  Service: Gynecology;  Laterality: N/A;    Family History  Problem Relation Age of Onset  . Diabetes Mother   . Osteoarthritis Mother   . Hypertension Mother   . Hypothyroidism Mother   . Heart disease Mother   . Hyperlipidemia Mother   . Migraines Mother   . Mental illness Mother     Depression  . Hypertension Father   . Heart failure Father   . Hepatitis Father   . Cirrhosis Father   . Hyperlipidemia Father   . Migraines Father   . Mental illness Father     Depression  . Breast cancer Maternal Aunt 70  . Cancer Sister 18    Cervical  . Heart disease Sister     Heart Stops  . Migraines Sister   . Seizures Sister 11    unsure if this or heart problem  . Mental illness Sister    depression  . Heart disease Maternal Grandmother     History  Substance Use Topics  . Smoking status: Current Some Day Smoker -- 0.50 packs/day for 12 years    Types: Cigarettes  . Smokeless tobacco: Never Used  . Alcohol Use: No    Allergies: No Known Allergies  Prescriptions prior to admission  Medication Sig Dispense Refill  . acetaminophen (TYLENOL) 500 MG tablet Take 1,000 mg by mouth every 6 (six) hours as needed for pain.      Marland Kitchen atenolol (TENORMIN) 50 MG tablet Take 1 tablet (50 mg total) by mouth daily.  30 tablet  5  . calcium carbonate (TUMS - DOSED IN MG ELEMENTAL CALCIUM) 500 MG chewable tablet Chew 1 tablet by mouth daily as needed for heartburn.      . flintstones complete (FLINTSTONES) 60 MG chewable tablet Chew 2 tablets by mouth daily.         Review of Systems  Constitutional: Negative for fever and chills.  Eyes: Negative for blurred vision and double vision.  Respiratory: Negative for cough.  Cardiovascular: Negative for chest pain.  Gastrointestinal: Negative for nausea, vomiting, abdominal pain, diarrhea, constipation and blood in stool.  Genitourinary: Negative for dysuria.  Skin: Negative for rash.  Neurological: Negative for dizziness and headaches.   Physical Exam   Blood pressure 125/72, pulse 88, temperature 99.8 F (37.7 C), temperature source Oral, resp. rate 18, last menstrual period 05/28/2012.  Physical Exam  Vitals reviewed. Constitutional: She is oriented to person, place, and time. She appears well-developed and well-nourished.  HENT:  Head: Normocephalic and atraumatic.  Neck: Neck supple.  Cardiovascular: Normal rate, regular rhythm and normal heart sounds.   Respiratory: Effort normal and breath sounds normal.  GI: Soft.  Musculoskeletal: Normal range of motion.  Neurological: She is alert and oriented to person, place, and time.  Skin: Skin is warm and dry.  Psychiatric: She has a normal mood and affect.   FWB: 140s, mod var,  + accels TOCO: initially ripples q2-74min. Now only occasional.    MAU Course  Procedures  MDM Pt initially feeling less fetal movement but improved throughout her time in MAU.  No abdominal pain, vaginal bleeding- very low risk concern for abruption.  TOCO initially with contractions and then quieted over the course of her time.   Assessment and Plan    - observed for 4 hours since the time of her fall.  Toco quieted down and fetal movement increased. No concern for abruption. Reactive NST.  Will d/c to home with return precautions.  F/u as scheduled tomorrow in clinic.    FWB- cat I tracing and reactive NST  Oumou Smead L MD 01/19/2013, 7:11 PM

## 2013-01-19 NOTE — MAU Note (Signed)
Pt requesting to go home.  Pt has an appt at [facility] in the morning.

## 2013-01-19 NOTE — MAU Note (Signed)
Pt states she fell around 1630, was getting out of the car carrying groceries.  She hit her L hand & L foot, unsure if she landed on her abdomen.  Denies any bleeding or LOF, states she is having some pelvic pressure which has been ongoing.

## 2013-01-19 NOTE — MAU Note (Signed)
Dr. Reola Calkins notified of pt request to go home.  MD will discharge pt at 2030.

## 2013-01-20 ENCOUNTER — Ambulatory Visit (INDEPENDENT_AMBULATORY_CARE_PROVIDER_SITE_OTHER): Payer: Medicaid Other | Admitting: Obstetrics & Gynecology

## 2013-01-20 ENCOUNTER — Encounter: Payer: Self-pay | Admitting: Obstetrics & Gynecology

## 2013-01-20 VITALS — BP 115/77 | Wt 196.0 lb

## 2013-01-20 DIAGNOSIS — O10013 Pre-existing essential hypertension complicating pregnancy, third trimester: Secondary | ICD-10-CM

## 2013-01-20 DIAGNOSIS — Z3480 Encounter for supervision of other normal pregnancy, unspecified trimester: Secondary | ICD-10-CM

## 2013-01-20 DIAGNOSIS — O10019 Pre-existing essential hypertension complicating pregnancy, unspecified trimester: Secondary | ICD-10-CM

## 2013-01-20 NOTE — Patient Instructions (Signed)
Return to clinic for any obstetric concerns or go to [facility] for evaluation  

## 2013-01-20 NOTE — Progress Notes (Signed)
Evaluated in MAU after a fall yesterday, fell on her hands and feet.  Was observed in MAU for four hours and sent home.  NST performed today was reviewed and was found to be reactive.  Continue recommended antenatal testing and prenatal care.  No other complaints or concerns.  Fetal movement and labor precautions reviewed.

## 2013-01-23 ENCOUNTER — Emergency Department: Payer: Self-pay | Admitting: Emergency Medicine

## 2013-01-23 IMAGING — CR DG FOOT COMPLETE 3+V*L*
1 series · 3 of 3 positions shown · non-contrast
Comparison: none

REASON FOR EXAM: injury 4 days ago. pain and swelling to top of foot. PT
IS 33 WEEKS PREGNANT.
COMMENTS:   LMP: Four weeks ago

[Series 1: ap · 0.17mm/px · 3 of 3 slices shown]
[im 1/3]
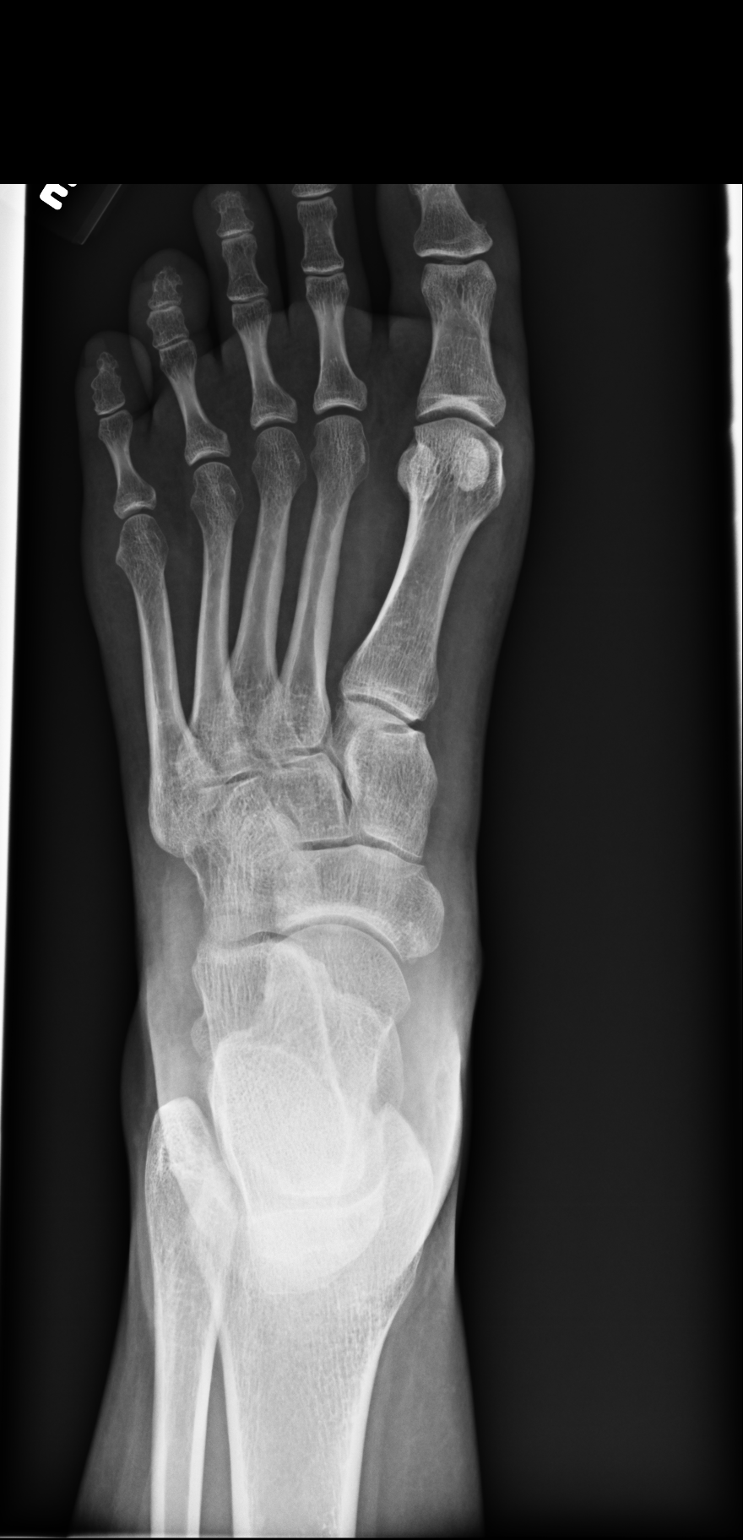
[im 2/3]
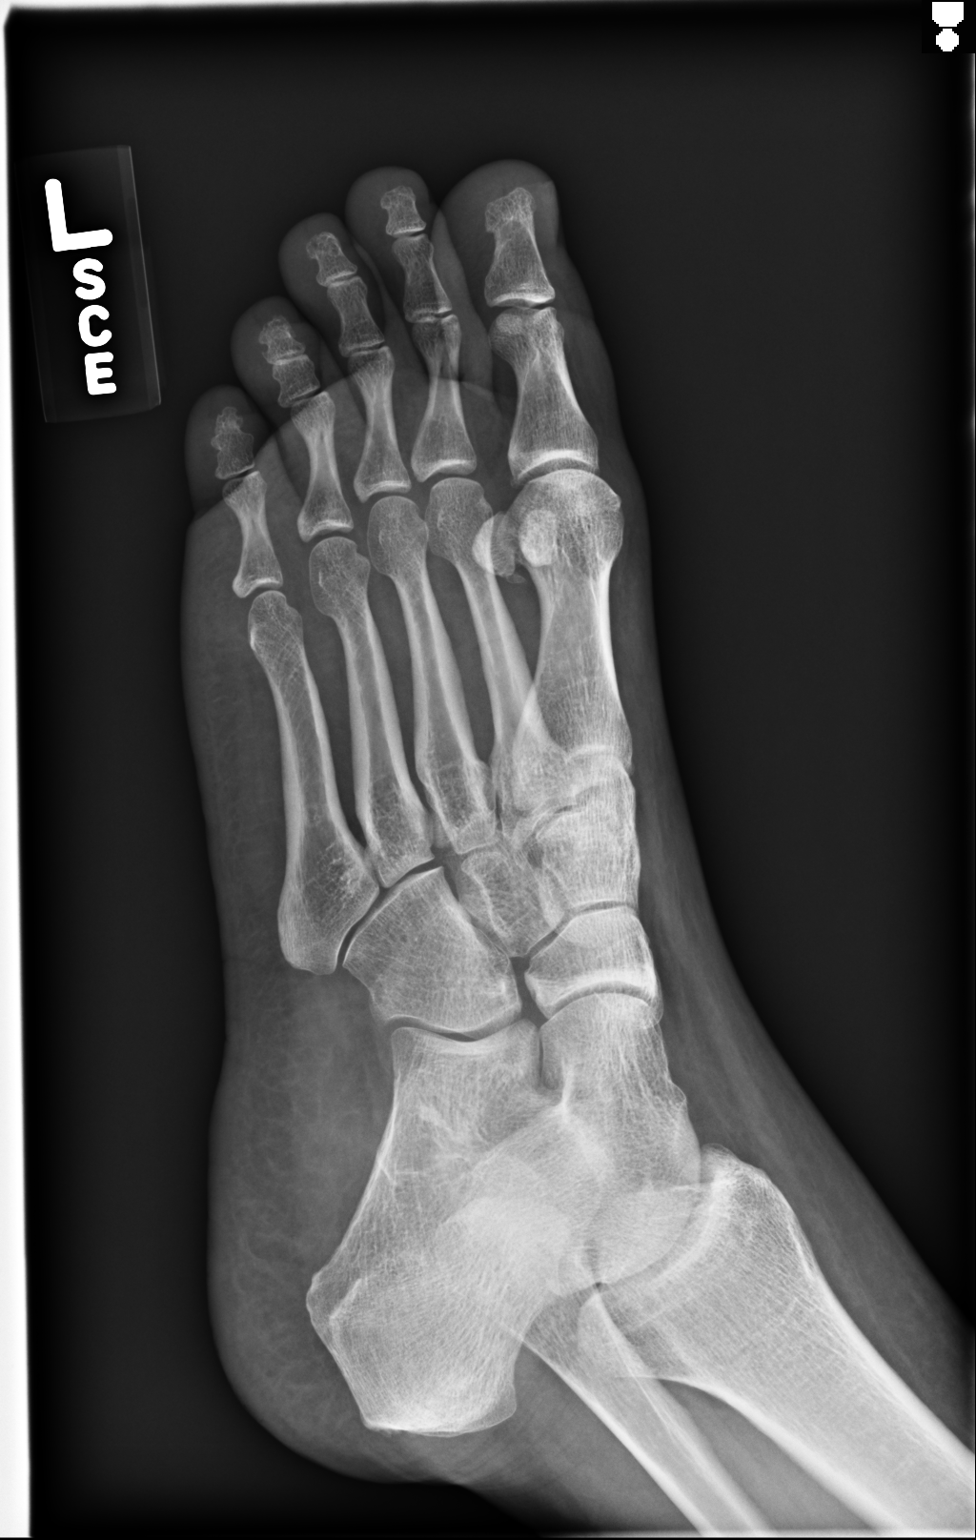
[im 3/3]
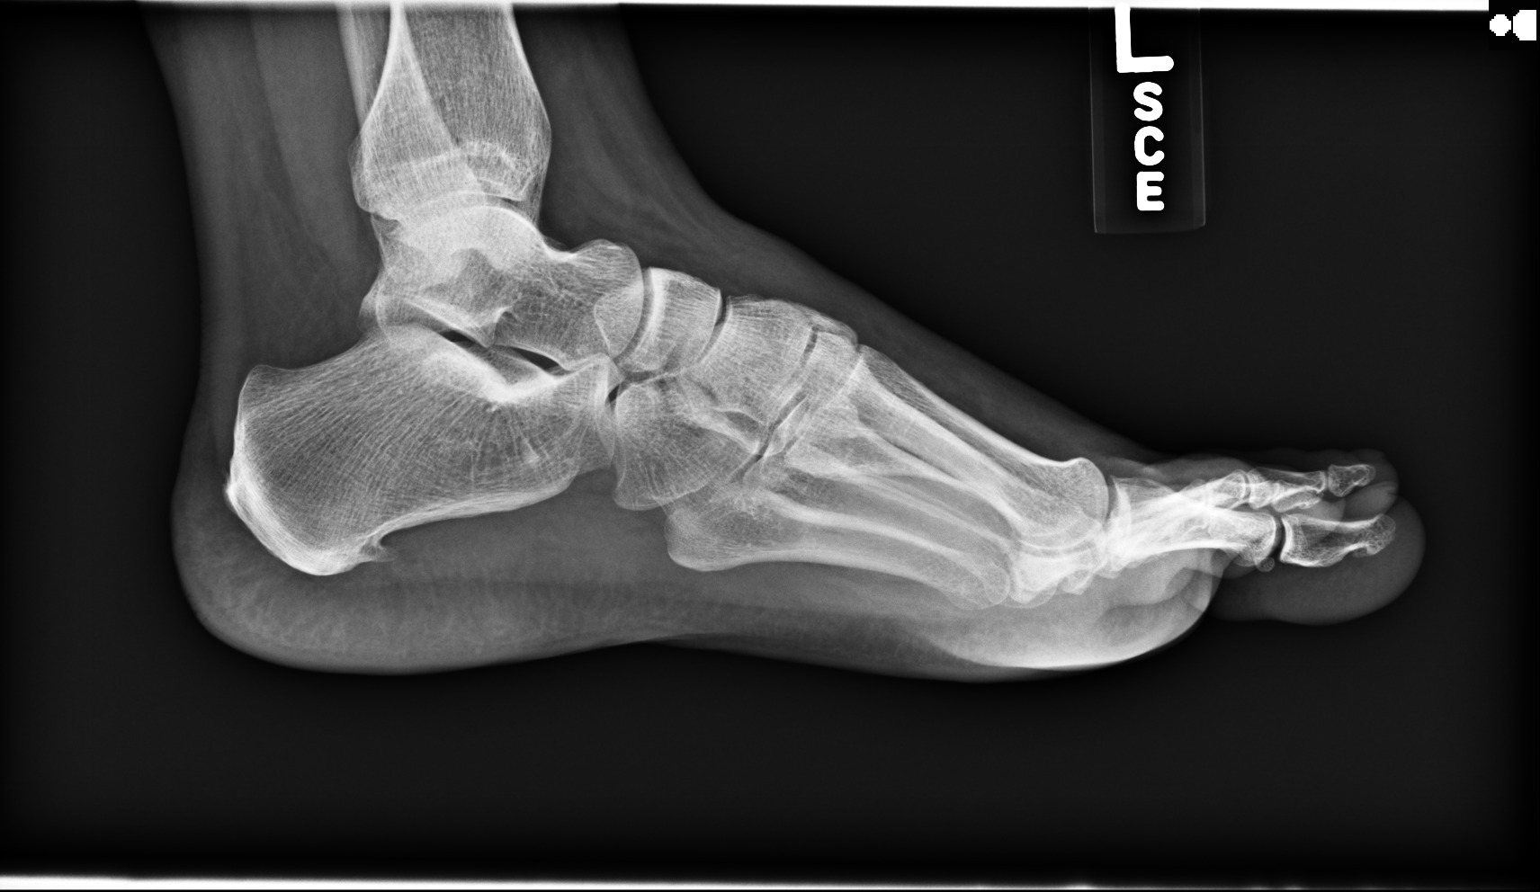

[3 of 3 positions shown; findings below may reference images not displayed]

PROCEDURE:     DXR - DXR FOOT LT COMP W/OBLIQUES  - [DATE]  [DATE]

RESULT:     Three views of the left foot reveal the bones to be adequately
mineralized. The interphalangeal joints and the metatarsophalangeal joints
appear normal. There is a plantar calcaneal spur. Otherwise the calcaneus
appears normal and the talus appears intact as do the other tarsal bones.
IMPRESSION: 1. There is no acute bony abnormality of the left foot. A definite fracture
at the base of the second metatarsal is not demonstrated. If there are
strong clinical concerns of such a fracture here, CT scanning is available
upon request.
2. There is mild soft tissue swelling over the midfoot.
3. There is a plantar calcaneal spur.

[REDACTED]

## 2013-01-28 ENCOUNTER — Other Ambulatory Visit: Payer: Self-pay | Admitting: Family Medicine

## 2013-01-28 ENCOUNTER — Ambulatory Visit (INDEPENDENT_AMBULATORY_CARE_PROVIDER_SITE_OTHER): Payer: Medicaid Other | Admitting: Family Medicine

## 2013-01-28 ENCOUNTER — Encounter: Payer: Self-pay | Admitting: Family Medicine

## 2013-01-28 VITALS — BP 123/84 | Wt 196.0 lb

## 2013-01-28 DIAGNOSIS — O10013 Pre-existing essential hypertension complicating pregnancy, third trimester: Secondary | ICD-10-CM

## 2013-01-28 DIAGNOSIS — O10913 Unspecified pre-existing hypertension complicating pregnancy, third trimester: Secondary | ICD-10-CM

## 2013-01-28 DIAGNOSIS — O24419 Gestational diabetes mellitus in pregnancy, unspecified control: Secondary | ICD-10-CM

## 2013-01-28 DIAGNOSIS — Z3480 Encounter for supervision of other normal pregnancy, unspecified trimester: Secondary | ICD-10-CM

## 2013-01-28 DIAGNOSIS — O10019 Pre-existing essential hypertension complicating pregnancy, unspecified trimester: Secondary | ICD-10-CM

## 2013-01-28 NOTE — Progress Notes (Signed)
Doing well--U/S shows appropriate growth. NST reviewed and reactive.

## 2013-01-28 NOTE — Patient Instructions (Signed)
Pregnancy - Third Trimester The third trimester of pregnancy (the last 3 months) is a period of the most rapid growth for you and your baby. The baby approaches a length of 20 inches and a weight of 6 to 10 pounds. The baby is adding on fat and getting ready for life outside your body. While inside, babies have periods of sleeping and waking, sucking thumbs, and hiccuping. You can often feel small contractions of the uterus. This is false labor. It is also called Braxton-Hicks contractions. This is like a practice for labor. The usual problems in this stage of pregnancy include more difficulty breathing, swelling of the hands and feet from water retention, and having to urinate more often because of the uterus and baby pressing on your bladder.  PRENATAL EXAMS  Blood work may continue to be done during prenatal exams. These tests are done to check on your health and the probable health of your baby. Blood work is used to follow your blood levels (hemoglobin). Anemia (low hemoglobin) is common during pregnancy. Iron and vitamins are given to help prevent this. You may also continue to be checked for diabetes. Some of the past blood tests may be done again.  The size of the uterus is measured during each visit. This makes sure your baby is growing properly according to your pregnancy dates.  Your blood pressure is checked every prenatal visit. This is to make sure you are not getting toxemia.  Your urine is checked every prenatal visit for infection, diabetes, and protein.  Your weight is checked at each visit. This is done to make sure gains are happening at the suggested rate and that you and your baby are growing normally.  Sometimes, an ultrasound is performed to confirm the position and the proper growth and development of the baby. This is a test done that bounces harmless sound waves off the baby so your caregiver can more accurately determine a due date.  Discuss the type of pain medicine and  anesthesia you will have during your labor and delivery.  Discuss the possibility and anesthesia if a cesarean section might be necessary.  Inform your caregiver if there is any mental or physical violence at home. Sometimes, a specialized non-stress test, contraction stress test, and biophysical profile are done to make sure the baby is not having a problem. Checking the amniotic fluid surrounding the baby is called an amniocentesis. The amniotic fluid is removed by sticking a needle into the belly (abdomen). This is sometimes done near the end of pregnancy if an early delivery is required. In this case, it is done to help make sure the baby's lungs are mature enough for the baby to live outside of the womb. If the lungs are not mature and it is unsafe to deliver the baby, an injection of cortisone medicine is given to the mother 1 to 2 days before the delivery. This helps the baby's lungs mature and makes it safer to deliver the baby. CHANGES OCCURING IN THE THIRD TRIMESTER OF PREGNANCY Your body goes through many changes during pregnancy. They vary from person to person. Talk to your caregiver about changes you notice and are concerned about.  During the last trimester, you have probably had an increase in your appetite. It is normal to have cravings for certain foods. This varies from person to person and pregnancy to pregnancy.  You may begin to get stretch marks on your hips, abdomen, and breasts. These are normal changes in the body   during pregnancy. There are no exercises or medicines to take which prevent this change.  Constipation may be treated with a stool softener or adding bulk to your diet. Drinking lots of fluids, fiber in vegetables, fruits, and whole grains are helpful.  Exercising is also helpful. If you have been very active up until your pregnancy, most of these activities can be continued during your pregnancy. If you have been less active, it is helpful to start an exercise  program such as walking. Consult your caregiver before starting exercise programs.  Avoid all smoking, alcohol, non-prescribed drugs, herbs and "street drugs" during your pregnancy. These chemicals affect the formation and growth of the baby. Avoid chemicals throughout the pregnancy to ensure the delivery of a healthy infant.  Backache, varicose veins, and hemorrhoids may develop or get worse.  You will tire more easily in the third trimester, which is normal.  The baby's movements may be stronger and more often.  You may become short of breath easily.  Your belly button may stick out.  A yellow discharge may leak from your breasts called colostrum.  You may have a bloody mucus discharge. This usually occurs a few days to a week before labor begins. HOME CARE INSTRUCTIONS   Keep your caregiver's appointments. Follow your caregiver's instructions regarding medicine use, exercise, and diet.  During pregnancy, you are providing food for you and your baby. Continue to eat regular, well-balanced meals. Choose foods such as meat, fish, milk and other low fat dairy products, vegetables, fruits, and whole-grain breads and cereals. Your caregiver will tell you of the ideal weight gain.  A physical sexual relationship may be continued throughout pregnancy if there are no other problems such as early (premature) leaking of amniotic fluid from the membranes, vaginal bleeding, or belly (abdominal) pain.  Exercise regularly if there are no restrictions. Check with your caregiver if you are unsure of the safety of your exercises. Greater weight gain will occur in the last 2 trimesters of pregnancy. Exercising helps:  Control your weight.  Get you in shape for labor and delivery.  You lose weight after you deliver.  Rest a lot with legs elevated, or as needed for leg cramps or low back pain.  Wear a good support or jogging bra for breast tenderness during pregnancy. This may help if worn during  sleep. Pads or tissues may be used in the bra if you are leaking colostrum.  Do not use hot tubs, steam rooms, or saunas.  Wear your seat belt when driving. This protects you and your baby if you are in an accident.  Avoid raw meat, cat litter boxes and soil used by cats. These carry germs that can cause birth defects in the baby.  It is easier to leak urine during pregnancy. Tightening up and strengthening the pelvic muscles will help with this problem. You can practice stopping your urination while you are going to the bathroom. These are the same muscles you need to strengthen. It is also the muscles you would use if you were trying to stop from passing gas. You can practice tightening these muscles up 10 times a set and repeating this about 3 times per day. Once you know what muscles to tighten up, do not perform these exercises during urination. It is more likely to cause an infection by backing up the urine.  Ask for help if you have financial, counseling, or nutritional needs during pregnancy. Your caregiver will be able to offer counseling for these   needs as well as refer you for other special needs.  Make a list of emergency phone numbers and have them available.  Plan on getting help from family or friends when you go home from the hospital.  Make a trial run to the hospital.  Take prenatal classes with the father to understand, practice, and ask questions about the labor and delivery.  Prepare the baby's room or nursery.  Do not travel out of the city unless it is absolutely necessary and with the advice of your caregiver.  Wear only low or no heal shoes to have better balance and prevent falling. MEDICINES AND DRUG USE IN PREGNANCY  Take prenatal vitamins as directed. The vitamin should contain 1 milligram of folic acid. Keep all vitamins out of reach of children. Only a couple vitamins or tablets containing iron may be fatal to a baby or young child when ingested.  Avoid use  of all medicines, including herbs, over-the-counter medicines, not prescribed or suggested by your caregiver. Only take over-the-counter or prescription medicines for pain, discomfort, or fever as directed by your caregiver. Do not use aspirin, ibuprofen or naproxen unless approved by your caregiver.  Let your caregiver also know about herbs you may be using.  Alcohol is related to a number of birth defects. This includes fetal alcohol syndrome. All alcohol, in any form, should be avoided completely. Smoking will cause low birth rate and premature babies.  Illegal drugs are very harmful to the baby. They are absolutely forbidden. A baby born to an addicted mother will be addicted at birth. The baby will go through the same withdrawal an adult does. SEEK MEDICAL CARE IF: You have any concerns or worries during your pregnancy. It is better to call with your questions if you feel they cannot wait, rather than worry about them. SEEK IMMEDIATE MEDICAL CARE IF:   An unexplained oral temperature above 102 F (38.9 C) develops, or as your caregiver suggests.  You have leaking of fluid from the vagina. If leaking membranes are suspected, take your temperature and tell your caregiver of this when you call.  There is vaginal spotting, bleeding or passing clots. Tell your caregiver of the amount and how many pads are used.  You develop a bad smelling vaginal discharge with a change in the color from clear to white.  You develop vomiting that lasts more than 24 hours.  You develop chills or fever.  You develop shortness of breath.  You develop burning on urination.  You loose more than 2 pounds of weight or gain more than 2 pounds of weight or as suggested by your caregiver.  You notice sudden swelling of your face, hands, and feet or legs.  You develop belly (abdominal) pain. Round ligament discomfort is a common non-cancerous (benign) cause of abdominal pain in pregnancy. Your caregiver still  must evaluate you.  You develop a severe headache that does not go away.  You develop visual problems, blurred or double vision.  If you have not felt your baby move for more than 1 hour. If you think the baby is not moving as much as usual, eat something with sugar in it and lie down on your left side for an hour. The baby should move at least 4 to 5 times per hour. Call right away if your baby moves less than that.  You fall, are in a car accident, or any kind of trauma.  There is mental or physical violence at home. Document Released: 06/12/2001   Document Revised: 03/12/2012 Document Reviewed: 12/15/2008 [employer] Patient Information 2014 [employer].  Breastfeeding A change in hormones during your pregnancy causes growth of your breast tissue and an increase in number and size of milk ducts. The hormone prolactin allows proteins, sugars, and fats from your blood supply to make breast milk in your milk-producing glands. The hormone progesterone prevents breast milk from being released before the birth of your baby. After the birth of your baby, your progesterone level decreases allowing breast milk to be released. Thoughts of your baby, as well as his or her sucking or crying, can stimulate the release of milk from the milk-producing glands. Deciding to breastfeed (nurse) is one of the best choices you can make for you and your baby. The information that follows gives a brief review of the benefits, as well as other important skills to know about breastfeeding. BENEFITS OF BREASTFEEDING For your baby  The first milk (colostrum) helps your baby's digestive system function better.   There are antibodies in your milk that help your baby fight off infections.   Your baby has a lower incidence of asthma, allergies, and sudden infant death syndrome (SIDS).   The nutrients in breast milk are better for your baby than infant formulas.  Breast milk improves your baby's brain development.    Your baby will have less gas, colic, and constipation.  Your baby is less likely to develop other conditions, such as childhood obesity, asthma, or diabetes mellitus. For you  Breastfeeding helps develop a very special bond between you and your baby.   Breastfeeding is convenient, always available at the correct temperature, and costs nothing.   Breastfeeding helps to burn calories and helps you lose the weight gained during pregnancy.   Breastfeeding makes your uterus contract back down to normal size faster and slows bleeding following delivery.   Breastfeeding mothers have a lower risk of developing osteoporosis or breast or ovarian cancer later in life.  BREASTFEEDING FREQUENCY  A healthy, full-term baby may breastfeed as often as every hour or space his or her feedings to every 3 hours. Breastfeeding frequency will vary from baby to baby.   Newborns should be fed no less than every 2 3 hours during the day and every 4 5 hours during the night. You should breastfeed a minimum of 8 feedings in a 24 hour period.  Awaken your baby to breastfeed if it has been 3 4 hours since the last feeding.  Breastfeed when you feel the need to reduce the fullness of your breasts or when your newborn shows signs of hunger. Signs that your baby may be hungry include:  Increased alertness or activity.  Stretching.  Movement of the head from side to side.  Movement of the head and opening of the mouth when the corner of the mouth or cheek is stroked (rooting).  Increased sucking sounds, smacking lips, cooing, sighing, or squeaking.  Hand-to-mouth movements.  Increased sucking of fingers or hands.  Fussing.  Intermittent crying.  Signs of extreme hunger will require calming and consoling before you try to feed your baby. Signs of extreme hunger may include:  Restlessness.  A loud, strong cry.  Screaming.  Frequent feeding will help you make more milk and will help prevent  problems, such as sore nipples and engorgement of the breasts.  BREASTFEEDING   Whether lying down or sitting, be sure that the baby's abdomen is facing your abdomen.   Support your breast with 4 fingers under your breast   and your thumb above your nipple. Make sure your fingers are well away from your nipple and your baby's mouth.   Stroke your baby's lips gently with your finger or nipple.   When your baby's mouth is open wide enough, place all of your nipple and as much of the colored area around your nipple (areola) as possible into your baby's mouth.  More areola should be visible above his or her upper lip than below his or her lower lip.  Your baby's tongue should be between his or her lower gum and your breast.  Ensure that your baby's mouth is correctly positioned around the nipple (latched). Your baby's lips should create a seal on your breast.  Signs that your baby has effectively latched onto your nipple include:  Tugging or sucking without pain.  Swallowing heard between sucks.  Absent click or smacking sound.  Muscle movement above and in front of his or her ears with sucking.  Your baby must suck about 2 3 minutes in order to get your milk. Allow your baby to feed on each breast as long as he or she wants. Nurse your baby until he or she unlatches or falls asleep at the first breast, then offer the second breast.  Signs that your baby is full and satisfied include:  A gradual decrease in the number of sucks or complete cessation of sucking.  Falling asleep.  Extension or relaxation of his or her body.  Retention of a small amount of milk in his or her mouth.  Letting go of your breast by himself or herself.  Signs of effective breastfeeding in you include:  Breasts that have increased firmness, weight, and size prior to feeding.  Breasts that are softer after nursing.  Increased milk volume, as well as a change in milk consistency and color by the 5th  day of breastfeeding.  Breast fullness relieved by breastfeeding.  Nipples are not sore, cracked, or bleeding.  If needed, break the suction by putting your finger into the corner of your baby's mouth and sliding your finger between his or her gums. Then, remove your breast from his or her mouth.  It is common for babies to spit up a small amount after a feeding.  Babies often swallow air during feeding. This can make babies fussy. Burping your baby between breasts can help with this.  Vitamin D supplements are recommended for babies who get only breast milk.  Avoid using a pacifier during your baby's first 4 6 weeks.  Avoid supplemental feedings of water, formula, or juice in place of breastfeeding. Breast milk is all the food your baby needs. It is not necessary for your baby to have water or formula. Your breasts will make more milk if supplemental feedings are avoided during the early weeks. HOW TO TELL WHETHER YOUR BABY IS GETTING ENOUGH BREAST MILK Wondering whether or not your baby is getting enough milk is a common concern among mothers. You can be assured that your baby is getting enough milk if:   Your baby is actively sucking and you hear swallowing.   Your baby seems relaxed and satisfied after a feeding.   Your baby nurses at least 8 12 times in a 24 hour time period.  During the first 3 5 days of age:  Your baby is wetting at least 3 5 diapers in a 24 hour period. The urine should be clear and pale yellow.  Your baby is having at least 3 4 stools in   a 24 hour period. The stool should be soft and yellow.  At 5 7 days of age, your baby is having at least 3 6 stools in a 24 hour period. The stool should be seedy and yellow by 5 days of age.  Your baby has a weight loss less than 7 10% during the first 3 days of age.  Your baby does not lose weight after 3 7 days of age.  Your baby gains 4 7 ounces each week after he or she is 4 days of age.  Your baby gains weight  by 5 days of age and is back to birth weight within 2 weeks. ENGORGEMENT In the first week after your baby is born, you may experience extremely full breasts (engorgement). When engorged, your breasts may feel heavy, warm, or tender to the touch. Engorgement peaks within 24 48 hours after delivery of your baby.  Engorgement may be reduced by:  Continuing to breastfeed.  Increasing the frequency of breastfeeding.  Taking warm showers or applying warm, moist heat to your breasts just before each feeding. This increases circulation and helps the milk flow.   Gently massaging your breast before and during the feedings. With your fingertips, massage from your chest wall towards your nipple in a circular motion.   Ensuring that your baby empties at least one breast at every feeding. It also helps to start the next feeding on the opposite breast.   Expressing breast milk by hand or by using a breast pump to empty the breasts if your baby is sleepy, or not nursing well. You may also want to express milk if you are returning to work oryou feel you are getting engorged.  Ensuring your baby is latched on and positioned properly while breastfeeding. If you follow these suggestions, your engorgement should improve in 24 48 hours. If you are still experiencing difficulty, call your lactation consultant or caregiver.  CARING FOR YOURSELF Take care of your breasts.  Bathe or shower daily.   Avoid using soap on your nipples.   Wear a supportive bra. Avoid wearing underwire style bras.  Air dry your nipples for a 3 4minutes after each feeding.   Use only cotton bra pads to absorb breast milk leakage. Leaking of breast milk between feedings is normal.   Use only pure lanolin on your nipples after nursing. You do not need to wash it off before feeding your baby again. Another option is to express a few drops of breast milk and gently massage that milk into your nipples.  Continue breast  self-awareness checks. Take care of yourself.  Eat healthy foods. Alternate 3 meals with 3 snacks.  Avoid foods that you notice affect your baby in a bad way.  Drink milk, fruit juice, and water to satisfy your thirst (about 8 glasses a day).   Rest often, relax, and take your prenatal vitamins to prevent fatigue, stress, and anemia.  Avoid chewing and smoking tobacco.  Avoid alcohol and drug use.  Take over-the-counter and prescribed medicine only as directed by your caregiver or pharmacist. You should always check with your caregiver or pharmacist before taking any new medicine, vitamin, or herbal supplement.  Know that pregnancy is possible while breastfeeding. If desired, talk to your caregiver about family planning and safe birth control methods that may be used while breastfeeding. SEEK MEDICAL CARE IF:   You feel like you want to stop breastfeeding or have become frustrated with breastfeeding.  You have painful breasts or nipples.    Your nipples are cracked or bleeding.  Your breasts are red, tender, or warm.  You have a swollen area on either breast.  You have a fever or chills.  You have nausea or vomiting.  You have drainage from your nipples.  Your breasts do not become full before feedings by the 5th day after delivery.  You feel sad and depressed.  Your baby is too sleepy to eat well.  Your baby is having trouble sleeping.   Your baby is wetting less than 3 diapers in a 24 hour period.  Your baby has less than 3 stools in a 24 hour period.  Your baby's skin or the white part of his or her eyes becomes more yellow.   Your baby is not gaining weight by 5 days of age. MAKE SURE YOU:   Understand these instructions.  Will watch your condition.  Will get help right away if you are not doing well or get worse. Document Released: 06/18/2005 Document Revised: 03/12/2012 Document Reviewed: 01/23/2012 [employer] Patient Information 2014 [employer].  

## 2013-02-03 ENCOUNTER — Ambulatory Visit (INDEPENDENT_AMBULATORY_CARE_PROVIDER_SITE_OTHER): Payer: Medicaid Other | Admitting: Family Medicine

## 2013-02-03 ENCOUNTER — Encounter: Payer: Self-pay | Admitting: Family Medicine

## 2013-02-03 VITALS — BP 107/77 | Wt 198.0 lb

## 2013-02-03 DIAGNOSIS — O10013 Pre-existing essential hypertension complicating pregnancy, third trimester: Secondary | ICD-10-CM

## 2013-02-03 DIAGNOSIS — O10019 Pre-existing essential hypertension complicating pregnancy, unspecified trimester: Secondary | ICD-10-CM

## 2013-02-03 DIAGNOSIS — Z3480 Encounter for supervision of other normal pregnancy, unspecified trimester: Secondary | ICD-10-CM

## 2013-02-03 NOTE — Assessment & Plan Note (Signed)
BP looks good  

## 2013-02-03 NOTE — Progress Notes (Signed)
NST reviewed and reactive. BP in excellent range.

## 2013-02-03 NOTE — Patient Instructions (Signed)
Preeclampsia and Eclampsia Preeclampsia is a condition of high blood pressure during pregnancy. It can happen at 20 weeks or later in pregnancy. If high blood pressure occurs in the second half of pregnancy with no other symptoms, it is called gestational hypertension and goes away after the baby is born. If any of the symptoms listed below develop with gestational hypertension, it is then called preeclampsia. Eclampsia (convulsions) may follow preeclampsia. This is one of the reasons for regular prenatal checkups. Early diagnosis and treatment are very important to prevent eclampsia. CAUSES  There is no known cause of preeclampsia/eclampsia in pregnancy. There are several known conditions that may put the pregnant woman at risk, such as:  The first pregnancy.  Having preeclampsia in a past pregnancy.  Having lasting (chronic) high blood pressure.  Having multiples (twins, triplets).  Being age 35 or older.  African American ethnic background.  Having kidney disease or diabetes.  Medical conditions such as lupus or blood diseases.  Being overweight (obese). SYMPTOMS   High blood pressure.  Headaches.  Sudden weight gain.  Swelling of hands, face, legs, and feet.  Protein in the urine.  Feeling sick to your stomach (nauseous) and throwing up (vomiting).  Vision problems (blurred or double vision).  Numbness in the face, arms, legs, and feet.  Dizziness.  Slurred speech.  Preeclampsia can cause growth retardation in the fetus.  Separation (abruption) of the placenta.  Not enough fluid in the amniotic sac (oligohydramnios).  Sensitivity to bright lights.  Belly (abdominal) pain. DIAGNOSIS  If protein is found in the urine in the second half of pregnancy, this is considered preeclampsia. Other symptoms mentioned above may also be present. TREATMENT  It is necessary to treat this.  Your caregiver may prescribe bed rest early in this condition. Plenty of rest and  salt restriction may be all that is needed.  Medicines may be necessary to lower blood pressure if the condition does not respond to more conservative measures.  In more severe cases, hospitalization may be needed:  For treatment of blood pressure.  To control fluid retention.  To monitor the baby to see if the condition is causing harm to the baby.  Hospitalization is the best way to treat the first sign of preeclampsia. This is so the mother and baby can be watched closely and blood tests can be done effectively and correctly.  If the condition becomes severe, it may be necessary to induce labor or to remove the infant by surgical means (cesarean section). The best cure for preeclampsia/eclampsia is to deliver the baby. Preeclampsia and eclampsia involve risks to mother and infant. Your caregiver will discuss these risks with you. Together, you can work out the best possible approach to your problems. Make sure you keep your prenatal visits as scheduled. Not keeping appointments could result in a chronic or permanent injury, pain, disability to you, and death or injury to you or your unborn baby. If there is any problem keeping the appointment, you must call to reschedule. HOME CARE INSTRUCTIONS   Keep your prenatal appointments and tests as scheduled.  Tell your caregiver if you have any of the above risk factors.  Get plenty of rest and sleep.  Eat a balanced diet that is low in salt, and do not add salt to your food.  Avoid stressful situations.  Only take over-the-counter and prescriptions medicines for pain, discomfort, or fever as directed by your caregiver. SEEK IMMEDIATE MEDICAL CARE IF:   You develop severe swelling   anywhere in the body. This usually occurs in the legs.  You gain 5 lb/2.3 kg or more in a week.  You develop a severe headache, dizziness, problems with your vision, or confusion.  You have abdominal pain, nausea, or vomiting.  You have a seizure.  You  have trouble moving any part of your body, or you develop numbness or problems speaking.  You have bruising or abnormal bleeding from anywhere in the body.  You develop a stiff neck.  You pass out. MAKE SURE YOU:   Understand these instructions.  Will watch your condition.  Will get help right away if you are not doing well or get worse. Document Released: 06/15/2000 Document Revised: 09/10/2011 Document Reviewed: 01/30/2008 [employer] Patient Information 2014 [employer].  Breastfeeding A change in hormones during your pregnancy causes growth of your breast tissue and an increase in number and size of milk ducts. The hormone prolactin allows proteins, sugars, and fats from your blood supply to make breast milk in your milk-producing glands. The hormone progesterone prevents breast milk from being released before the birth of your baby. After the birth of your baby, your progesterone level decreases allowing breast milk to be released. Thoughts of your baby, as well as his or her sucking or crying, can stimulate the release of milk from the milk-producing glands. Deciding to breastfeed (nurse) is one of the best choices you can make for you and your baby. The information that follows gives a brief review of the benefits, as well as other important skills to know about breastfeeding. BENEFITS OF BREASTFEEDING For your baby  The first milk (colostrum) helps your baby's digestive system function better.   There are antibodies in your milk that help your baby fight off infections.   Your baby has a lower incidence of asthma, allergies, and sudden infant death syndrome (SIDS).   The nutrients in breast milk are better for your baby than infant formulas.  Breast milk improves your baby's brain development.   Your baby will have less gas, colic, and constipation.  Your baby is less likely to develop other conditions, such as childhood obesity, asthma, or diabetes mellitus. For  you  Breastfeeding helps develop a very special bond between you and your baby.   Breastfeeding is convenient, always available at the correct temperature, and costs nothing.   Breastfeeding helps to burn calories and helps you lose the weight gained during pregnancy.   Breastfeeding makes your uterus contract back down to normal size faster and slows bleeding following delivery.   Breastfeeding mothers have a lower risk of developing osteoporosis or breast or ovarian cancer later in life.  BREASTFEEDING FREQUENCY  A healthy, full-term baby may breastfeed as often as every hour or space his or her feedings to every 3 hours. Breastfeeding frequency will vary from baby to baby.   Newborns should be fed no less than every 2 3 hours during the day and every 4 5 hours during the night. You should breastfeed a minimum of 8 feedings in a 24 hour period.  Awaken your baby to breastfeed if it has been 3 4 hours since the last feeding.  Breastfeed when you feel the need to reduce the fullness of your breasts or when your newborn shows signs of hunger. Signs that your baby may be hungry include:  Increased alertness or activity.  Stretching.  Movement of the head from side to side.  Movement of the head and opening of the mouth when the corner of   the mouth or cheek is stroked (rooting).  Increased sucking sounds, smacking lips, cooing, sighing, or squeaking.  Hand-to-mouth movements.  Increased sucking of fingers or hands.  Fussing.  Intermittent crying.  Signs of extreme hunger will require calming and consoling before you try to feed your baby. Signs of extreme hunger may include:  Restlessness.  A loud, strong cry.  Screaming.  Frequent feeding will help you make more milk and will help prevent problems, such as sore nipples and engorgement of the breasts.  BREASTFEEDING   Whether lying down or sitting, be sure that the baby's abdomen is facing your abdomen.    Support your breast with 4 fingers under your breast and your thumb above your nipple. Make sure your fingers are well away from your nipple and your baby's mouth.   Stroke your baby's lips gently with your finger or nipple.   When your baby's mouth is open wide enough, place all of your nipple and as much of the colored area around your nipple (areola) as possible into your baby's mouth.  More areola should be visible above his or her upper lip than below his or her lower lip.  Your baby's tongue should be between his or her lower gum and your breast.  Ensure that your baby's mouth is correctly positioned around the nipple (latched). Your baby's lips should create a seal on your breast.  Signs that your baby has effectively latched onto your nipple include:  Tugging or sucking without pain.  Swallowing heard between sucks.  Absent click or smacking sound.  Muscle movement above and in front of his or her ears with sucking.  Your baby must suck about 2 3 minutes in order to get your milk. Allow your baby to feed on each breast as long as he or she wants. Nurse your baby until he or she unlatches or falls asleep at the first breast, then offer the second breast.  Signs that your baby is full and satisfied include:  A gradual decrease in the number of sucks or complete cessation of sucking.  Falling asleep.  Extension or relaxation of his or her body.  Retention of a small amount of milk in his or her mouth.  Letting go of your breast by himself or herself.  Signs of effective breastfeeding in you include:  Breasts that have increased firmness, weight, and size prior to feeding.  Breasts that are softer after nursing.  Increased milk volume, as well as a change in milk consistency and color by the 5th day of breastfeeding.  Breast fullness relieved by breastfeeding.  Nipples are not sore, cracked, or bleeding.  If needed, break the suction by putting your finger  into the corner of your baby's mouth and sliding your finger between his or her gums. Then, remove your breast from his or her mouth.  It is common for babies to spit up a small amount after a feeding.  Babies often swallow air during feeding. This can make babies fussy. Burping your baby between breasts can help with this.  Vitamin D supplements are recommended for babies who get only breast milk.  Avoid using a pacifier during your baby's first 4 6 weeks.  Avoid supplemental feedings of water, formula, or juice in place of breastfeeding. Breast milk is all the food your baby needs. It is not necessary for your baby to have water or formula. Your breasts will make more milk if supplemental feedings are avoided during the early weeks. HOW TO   TELL WHETHER YOUR BABY IS GETTING ENOUGH BREAST MILK Wondering whether or not your baby is getting enough milk is a common concern among mothers. You can be assured that your baby is getting enough milk if:   Your baby is actively sucking and you hear swallowing.   Your baby seems relaxed and satisfied after a feeding.   Your baby nurses at least 8 12 times in a 24 hour time period.  During the first 3 5 days of age:  Your baby is wetting at least 3 5 diapers in a 24 hour period. The urine should be clear and pale yellow.  Your baby is having at least 3 4 stools in a 24 hour period. The stool should be soft and yellow.  At 5 7 days of age, your baby is having at least 3 6 stools in a 24 hour period. The stool should be seedy and yellow by 5 days of age.  Your baby has a weight loss less than 7 10% during the first 3 days of age.  Your baby does not lose weight after 3 7 days of age.  Your baby gains 4 7 ounces each week after he or she is 4 days of age.  Your baby gains weight by 5 days of age and is back to birth weight within 2 weeks. ENGORGEMENT In the first week after your baby is born, you may experience extremely full breasts  (engorgement). When engorged, your breasts may feel heavy, warm, or tender to the touch. Engorgement peaks within 24 48 hours after delivery of your baby.  Engorgement may be reduced by:  Continuing to breastfeed.  Increasing the frequency of breastfeeding.  Taking warm showers or applying warm, moist heat to your breasts just before each feeding. This increases circulation and helps the milk flow.   Gently massaging your breast before and during the feedings. With your fingertips, massage from your chest wall towards your nipple in a circular motion.   Ensuring that your baby empties at least one breast at every feeding. It also helps to start the next feeding on the opposite breast.   Expressing breast milk by hand or by using a breast pump to empty the breasts if your baby is sleepy, or not nursing well. You may also want to express milk if you are returning to work oryou feel you are getting engorged.  Ensuring your baby is latched on and positioned properly while breastfeeding. If you follow these suggestions, your engorgement should improve in 24 48 hours. If you are still experiencing difficulty, call your lactation consultant or caregiver.  CARING FOR YOURSELF Take care of your breasts.  Bathe or shower daily.   Avoid using soap on your nipples.   Wear a supportive bra. Avoid wearing underwire style bras.  Air dry your nipples for a 3 4minutes after each feeding.   Use only cotton bra pads to absorb breast milk leakage. Leaking of breast milk between feedings is normal.   Use only pure lanolin on your nipples after nursing. You do not need to wash it off before feeding your baby again. Another option is to express a few drops of breast milk and gently massage that milk into your nipples.  Continue breast self-awareness checks. Take care of yourself.  Eat healthy foods. Alternate 3 meals with 3 snacks.  Avoid foods that you notice affect your baby in a bad  way.  Drink milk, fruit juice, and water to satisfy your thirst (about 8   glasses a day).   Rest often, relax, and take your prenatal vitamins to prevent fatigue, stress, and anemia.  Avoid chewing and smoking tobacco.  Avoid alcohol and drug use.  Take over-the-counter and prescribed medicine only as directed by your caregiver or pharmacist. You should always check with your caregiver or pharmacist before taking any new medicine, vitamin, or herbal supplement.  Know that pregnancy is possible while breastfeeding. If desired, talk to your caregiver about family planning and safe birth control methods that may be used while breastfeeding. SEEK MEDICAL CARE IF:   You feel like you want to stop breastfeeding or have become frustrated with breastfeeding.  You have painful breasts or nipples.  Your nipples are cracked or bleeding.  Your breasts are red, tender, or warm.  You have a swollen area on either breast.  You have a fever or chills.  You have nausea or vomiting.  You have drainage from your nipples.  Your breasts do not become full before feedings by the 5th day after delivery.  You feel sad and depressed.  Your baby is too sleepy to eat well.  Your baby is having trouble sleeping.   Your baby is wetting less than 3 diapers in a 24 hour period.  Your baby has less than 3 stools in a 24 hour period.  Your baby's skin or the white part of his or her eyes becomes more yellow.   Your baby is not gaining weight by 5 days of age. MAKE SURE YOU:   Understand these instructions.  Will watch your condition.  Will get help right away if you are not doing well or get worse. Document Released: 06/18/2005 Document Revised: 03/12/2012 Document Reviewed: 01/23/2012 [employer] Patient Information 2014 [employer].  

## 2013-02-06 ENCOUNTER — Ambulatory Visit (HOSPITAL_COMMUNITY): Payer: Medicaid Other

## 2013-02-06 ENCOUNTER — Ambulatory Visit (HOSPITAL_COMMUNITY)
Admission: RE | Admit: 2013-02-06 | Discharge: 2013-02-06 | Disposition: A | Discharge status: Home / Self Care | Payer: Medicaid Other | Source: Ambulatory Visit | Attending: Family Medicine | Admitting: Family Medicine

## 2013-02-06 ENCOUNTER — Other Ambulatory Visit: Payer: Self-pay | Admitting: Family Medicine

## 2013-02-06 DIAGNOSIS — O24419 Gestational diabetes mellitus in pregnancy, unspecified control: Secondary | ICD-10-CM

## 2013-02-06 DIAGNOSIS — O10019 Pre-existing essential hypertension complicating pregnancy, unspecified trimester: Secondary | ICD-10-CM | POA: Insufficient documentation

## 2013-02-06 DIAGNOSIS — O10913 Unspecified pre-existing hypertension complicating pregnancy, third trimester: Secondary | ICD-10-CM

## 2013-02-06 DIAGNOSIS — O99330 Smoking (tobacco) complicating pregnancy, unspecified trimester: Secondary | ICD-10-CM | POA: Insufficient documentation

## 2013-02-06 IMAGING — US US FETAL BPP W/O NONSTRESS
1 series · 14 of 15 positions shown · non-contrast
Comparison: none

[Series 1: us fetal bpp w/o nonstress · 0.26mm/px · 15 acquisitions, 14 frames shown]
[im 1/15]
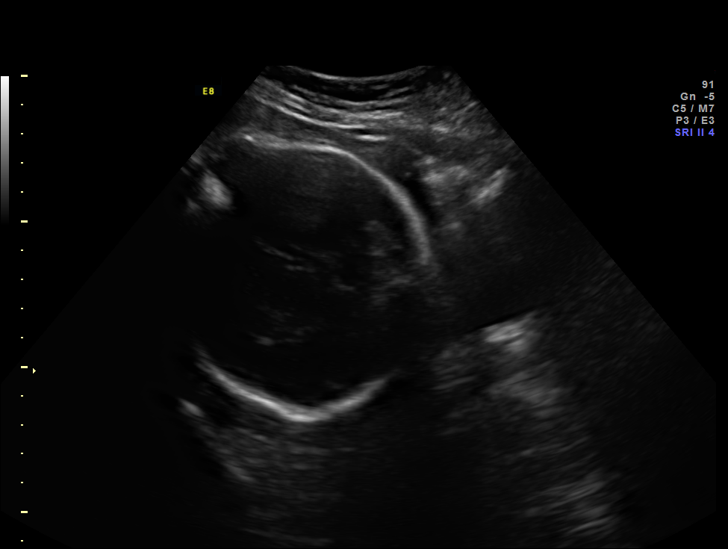
[im 2/15]
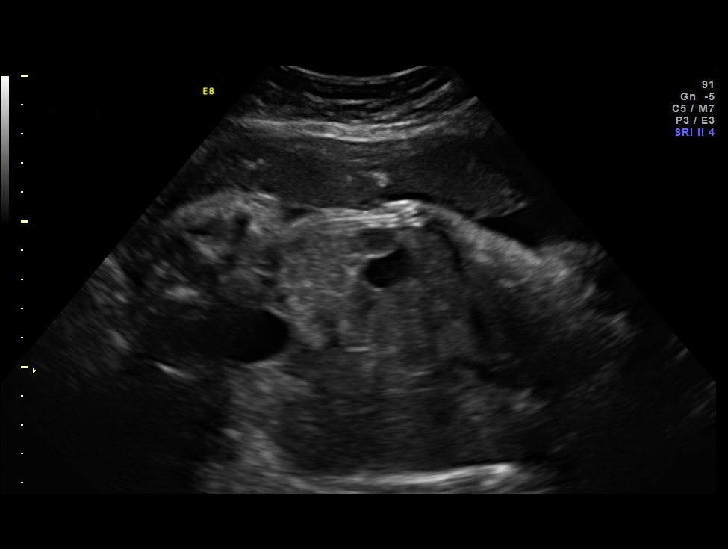
[im 3/15]
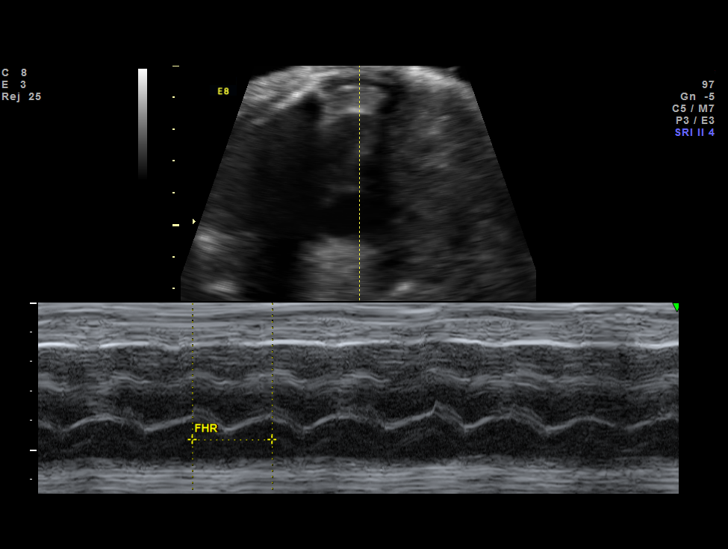
[im 4/15]
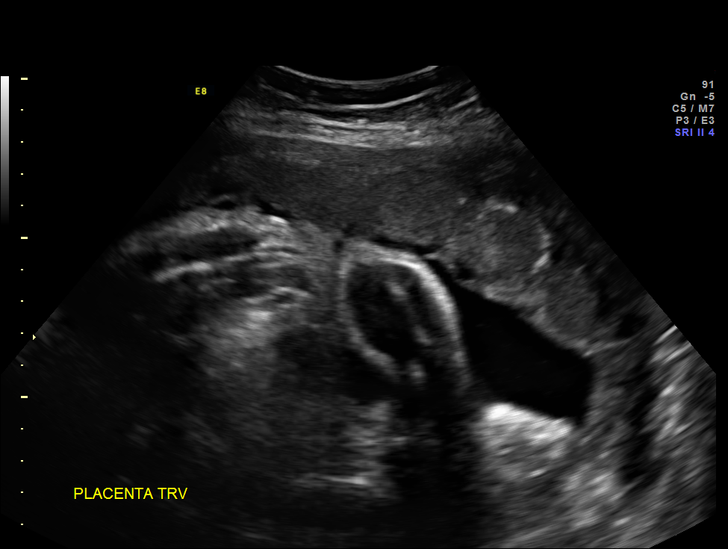
[im 5/15]
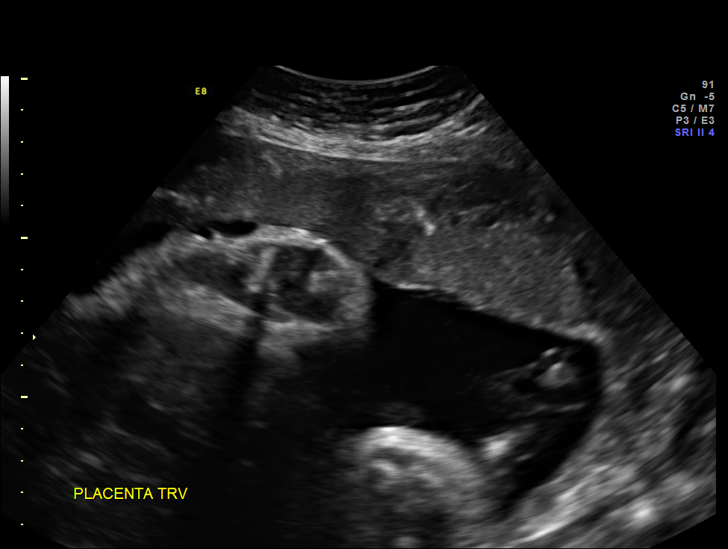
[im 6/15]
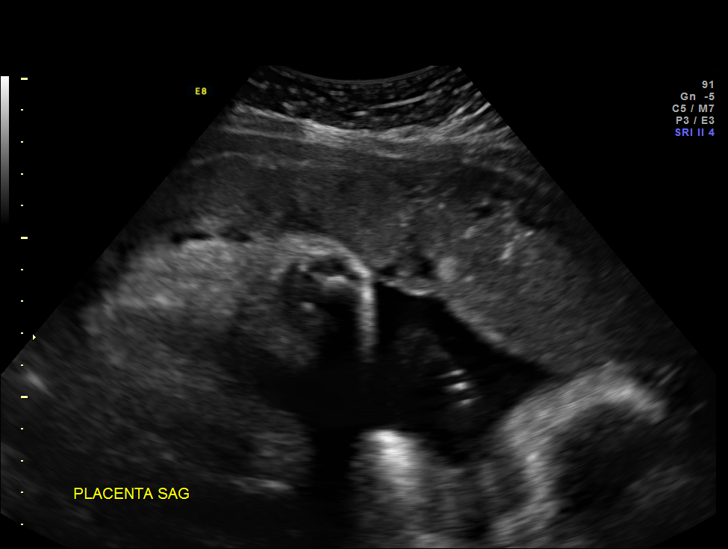
[im 7/15]
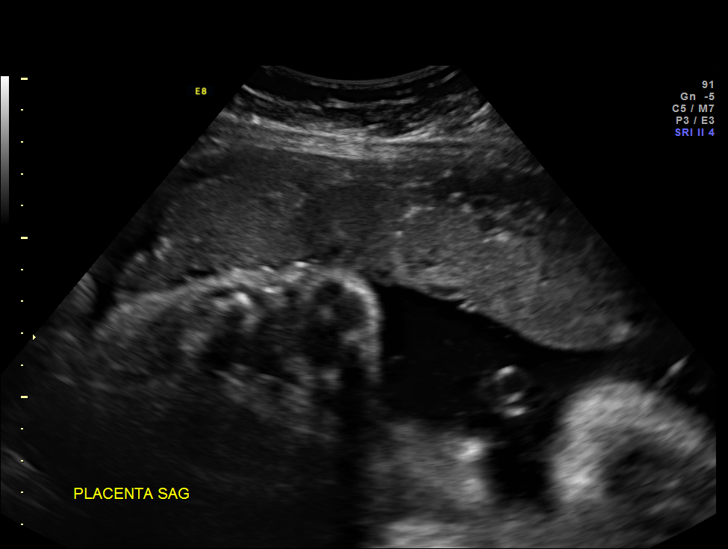
[im 9/15]
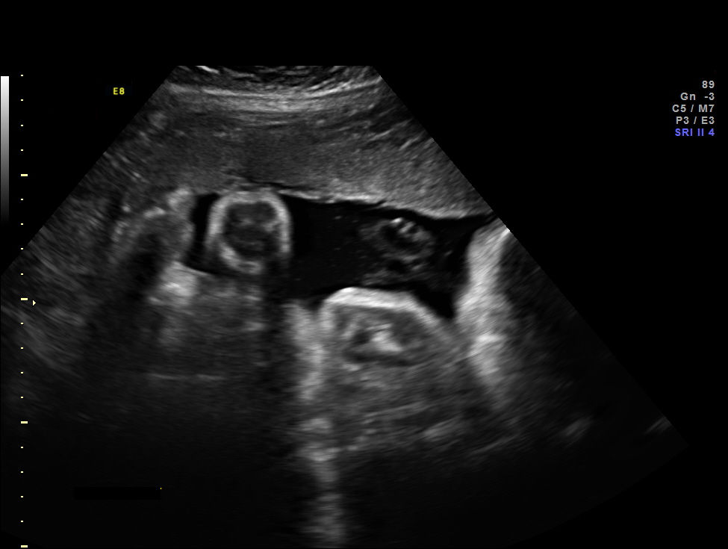
[im 10/15]
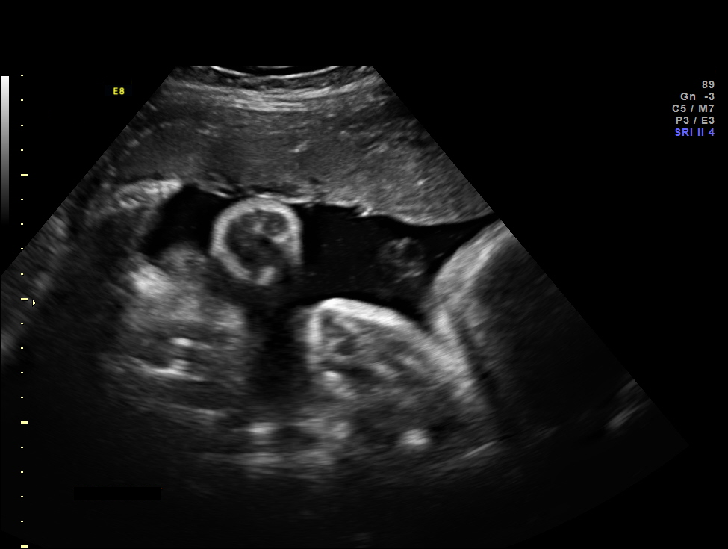
[im 11/15]
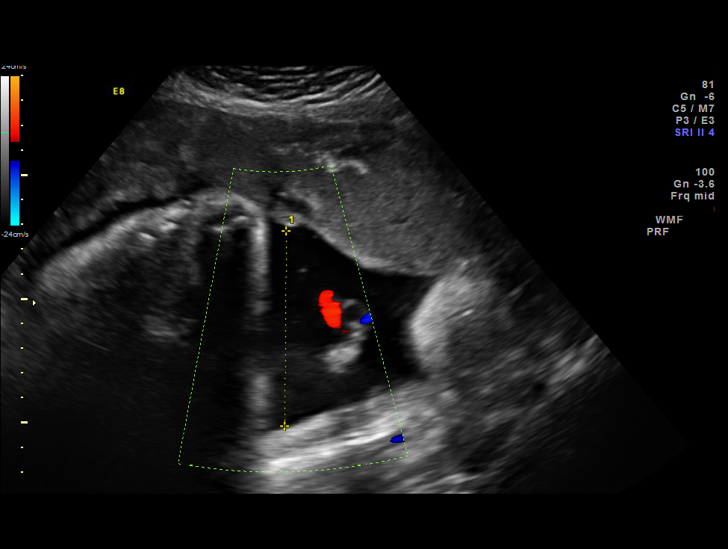
[im 12/15]
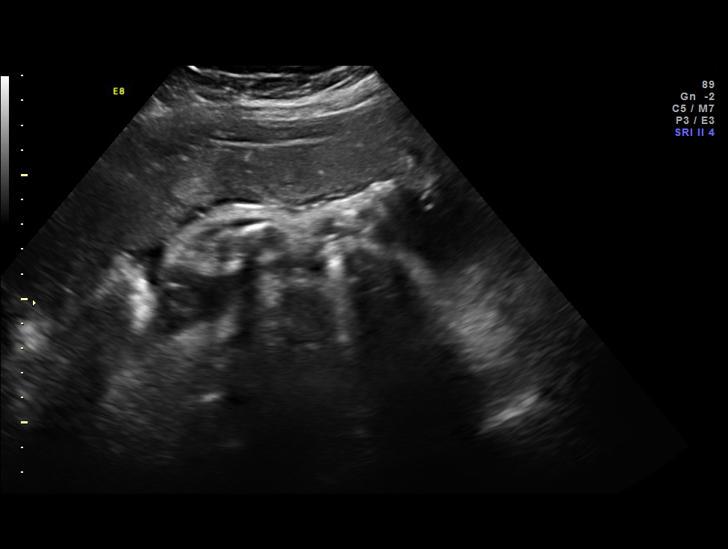
[im 13/15]
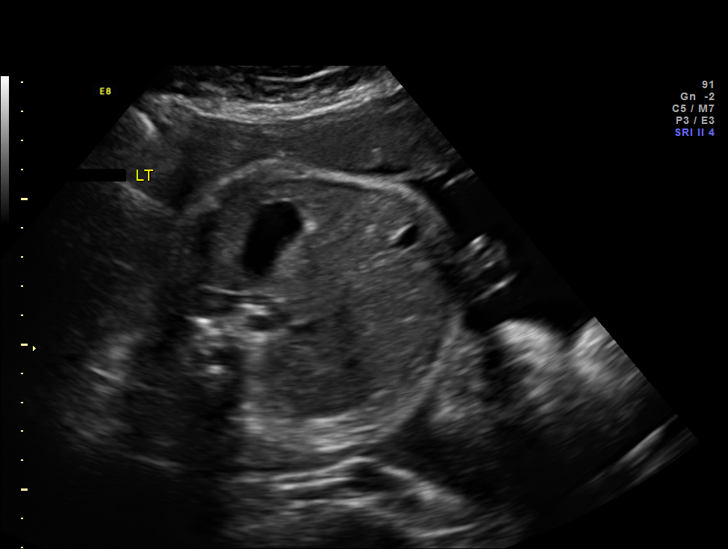
[im 14/15]
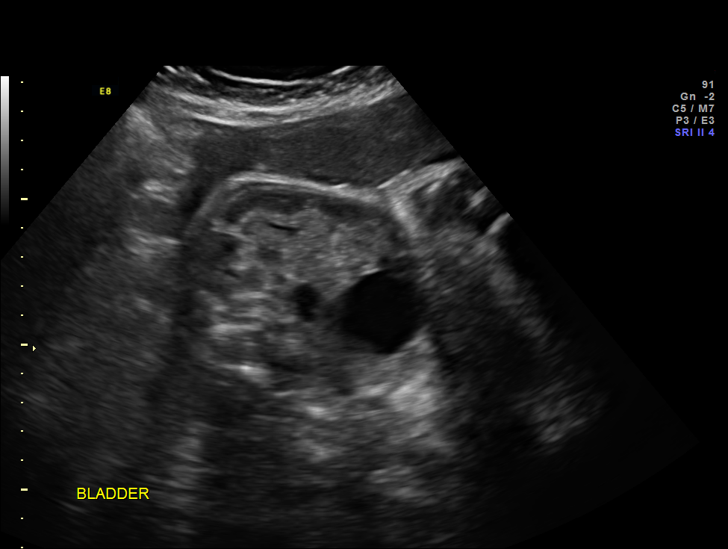
[im 15/15]
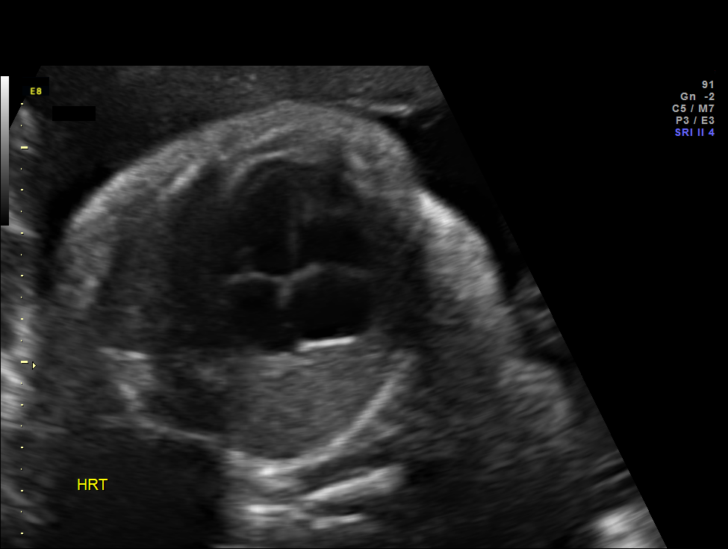

[14 of 15 positions shown; findings below may reference images not displayed]

OBSTETRICS REPORT
                      (Signed Final [DATE] [DATE])

Service(s) Provided

Indications

 Hypertension - Chronic/Pre-existing; on Atenolol
 Cigarette smoker
Fetal Evaluation

 Num Of Fetuses:    1
 Fetal Heart Rate:  126                          bpm
 Cardiac Activity:  Observed
 Presentation:      Cephalic
 Placenta:          Anterior, above cervical os
 P. Cord            Previously Visualized
 Insertion:

 Amniotic Fluid
 AFI FV:      Subjectively within normal limits
                                             Larg Pckt:     7.8  cm
Biophysical Evaluation

 Amniotic F.V:   Pocket => 2 cm two         F. Tone:        Observed
                 planes
 F. Movement:    Observed                   Score:          [DATE]
 F. Breathing:   Observed
Gestational Age

 LMP:           36w 2d        Date:  [DATE]                 EDD:   [DATE]
 Best:          35w 4d     Det. By:  U/S C R L   ([DATE])   EDD:   [DATE]
Impression

 IUP at 35+4 weeks
 Normal amniotic fluid volume
 BPP [DATE]
Recommendations

 Continue twice weekly testing
 Growth US next week with BPP

## 2013-02-10 ENCOUNTER — Encounter: Payer: Self-pay | Admitting: Obstetrics & Gynecology

## 2013-02-10 ENCOUNTER — Ambulatory Visit (INDEPENDENT_AMBULATORY_CARE_PROVIDER_SITE_OTHER): Payer: Medicaid Other | Admitting: Obstetrics & Gynecology

## 2013-02-10 VITALS — BP 117/73 | Wt 198.0 lb

## 2013-02-10 DIAGNOSIS — O0990 Supervision of high risk pregnancy, unspecified, unspecified trimester: Secondary | ICD-10-CM

## 2013-02-10 DIAGNOSIS — O139 Gestational [pregnancy-induced] hypertension without significant proteinuria, unspecified trimester: Secondary | ICD-10-CM

## 2013-02-10 DIAGNOSIS — Z3480 Encounter for supervision of other normal pregnancy, unspecified trimester: Secondary | ICD-10-CM

## 2013-02-10 DIAGNOSIS — O0993 Supervision of high risk pregnancy, unspecified, third trimester: Secondary | ICD-10-CM

## 2013-02-10 DIAGNOSIS — Z3483 Encounter for supervision of other normal pregnancy, third trimester: Secondary | ICD-10-CM

## 2013-02-10 DIAGNOSIS — O163 Unspecified maternal hypertension, third trimester: Secondary | ICD-10-CM

## 2013-02-10 NOTE — Progress Notes (Signed)
P - 81 

## 2013-02-10 NOTE — Progress Notes (Signed)
Routine visit. Good FM. No problems. NST reactive and reviewed. GBS/CT/GC obtained today. Labor precautions reviewed.

## 2013-02-11 ENCOUNTER — Other Ambulatory Visit (HOSPITAL_COMMUNITY): Payer: Self-pay | Admitting: Maternal and Fetal Medicine

## 2013-02-11 DIAGNOSIS — O24919 Unspecified diabetes mellitus in pregnancy, unspecified trimester: Secondary | ICD-10-CM

## 2013-02-11 DIAGNOSIS — O10013 Pre-existing essential hypertension complicating pregnancy, third trimester: Secondary | ICD-10-CM

## 2013-02-11 DIAGNOSIS — O99333 Smoking (tobacco) complicating pregnancy, third trimester: Secondary | ICD-10-CM

## 2013-02-12 ENCOUNTER — Ambulatory Visit (HOSPITAL_COMMUNITY)
Admission: RE | Admit: 2013-02-12 | Discharge: 2013-02-12 | Disposition: A | Discharge status: Home / Self Care | Payer: Medicaid Other | Source: Ambulatory Visit | Attending: Obstetrics & Gynecology | Admitting: Obstetrics & Gynecology

## 2013-02-12 DIAGNOSIS — O99333 Smoking (tobacco) complicating pregnancy, third trimester: Secondary | ICD-10-CM

## 2013-02-12 DIAGNOSIS — O10019 Pre-existing essential hypertension complicating pregnancy, unspecified trimester: Secondary | ICD-10-CM | POA: Insufficient documentation

## 2013-02-12 DIAGNOSIS — O24919 Unspecified diabetes mellitus in pregnancy, unspecified trimester: Secondary | ICD-10-CM

## 2013-02-12 DIAGNOSIS — O10013 Pre-existing essential hypertension complicating pregnancy, third trimester: Secondary | ICD-10-CM

## 2013-02-12 DIAGNOSIS — O99330 Smoking (tobacco) complicating pregnancy, unspecified trimester: Secondary | ICD-10-CM | POA: Insufficient documentation

## 2013-02-12 IMAGING — US US OB FOLLOW-UP
1 series · 12 of 28 positions shown · non-contrast
Comparison: none

[Series 1: us ob follow-up · 43 acquisitions, 12 frames shown]
[im 2/43]
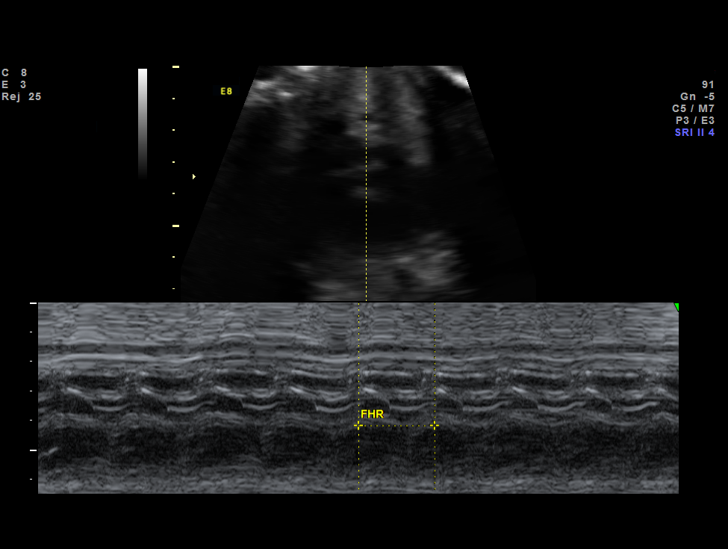
[im 5/43]
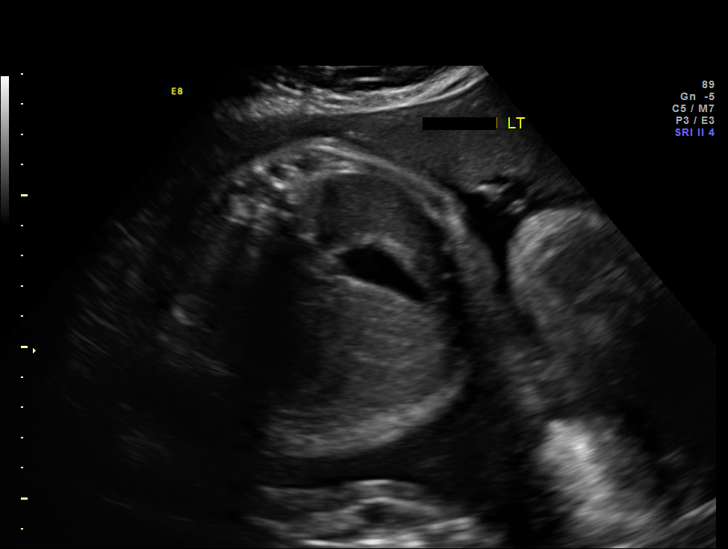
[im 8/43]
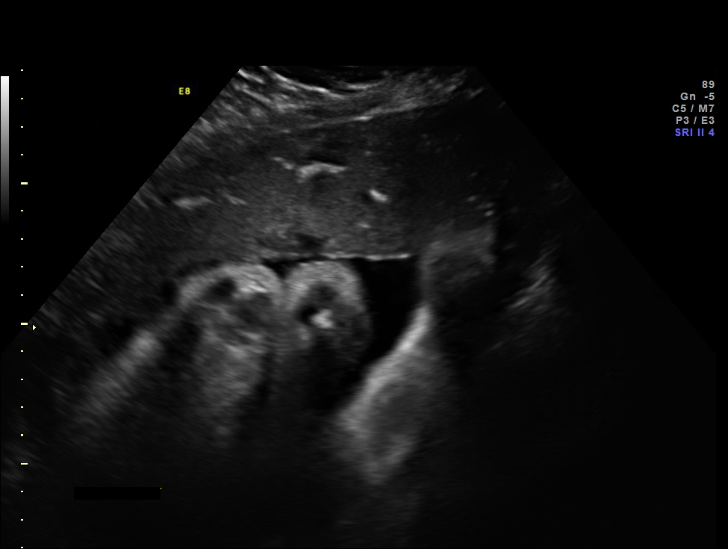
[im 13/43]
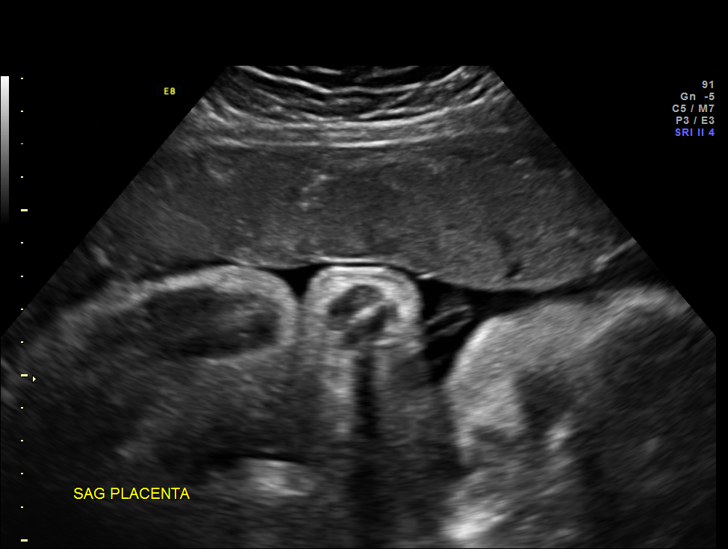
[im 16/43]
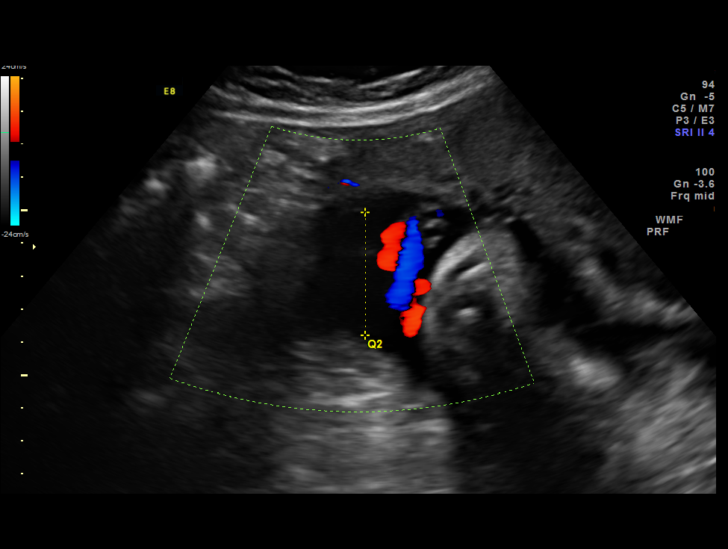
[im 19/43]
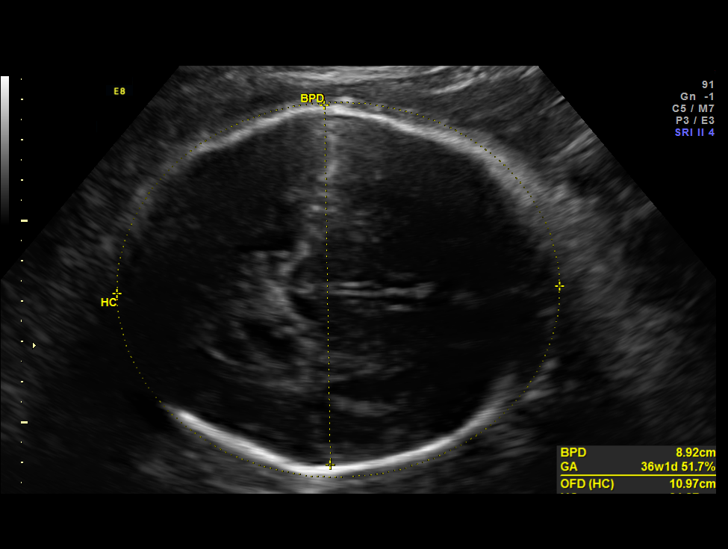
[im 24/43]
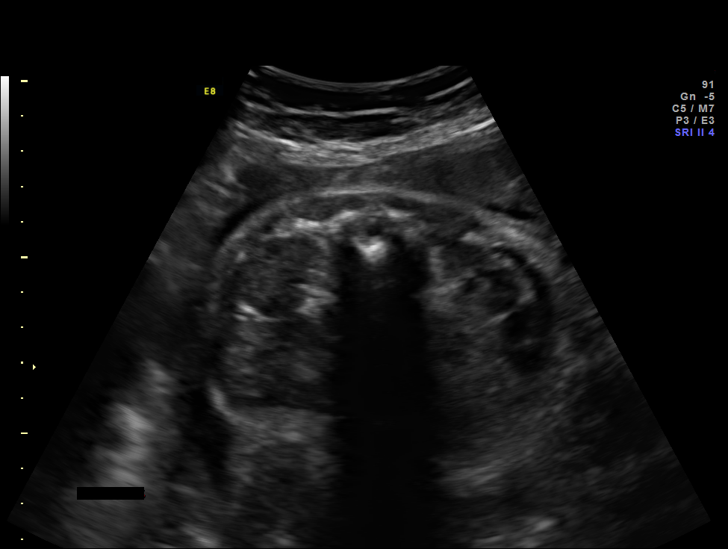
[im 27/43]
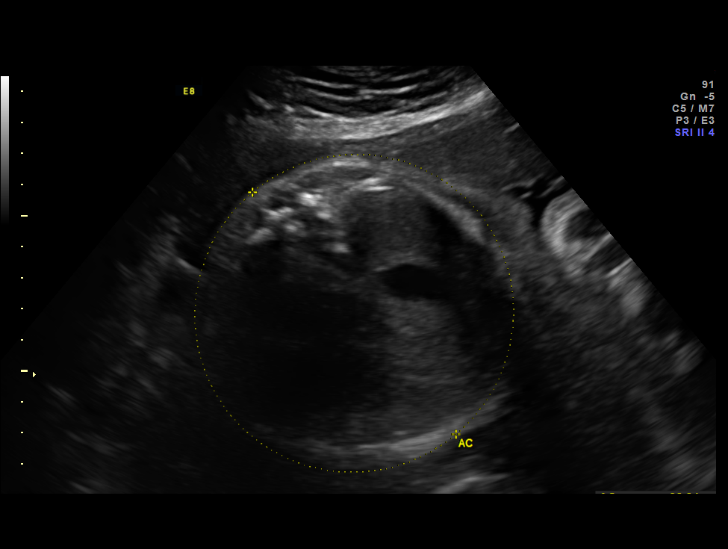
[im 30/43]
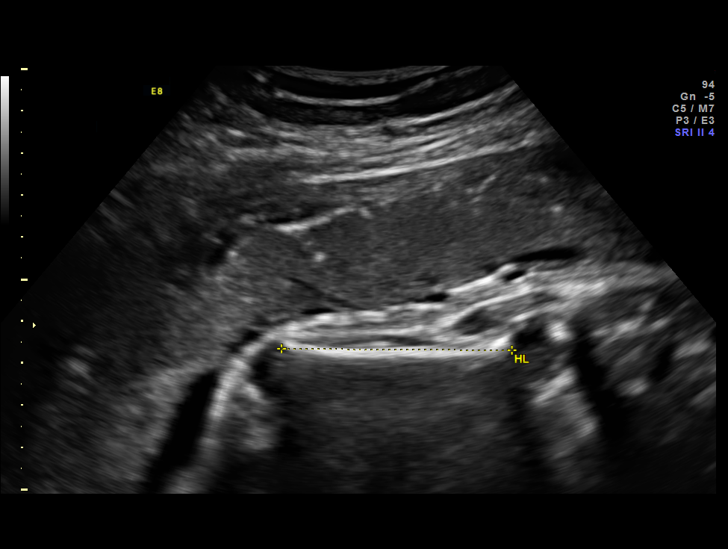
[im 35/43]
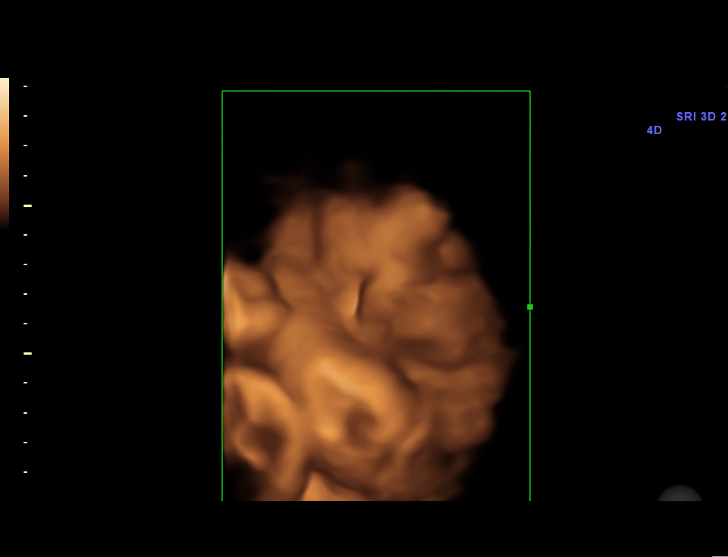
[im 38/43]
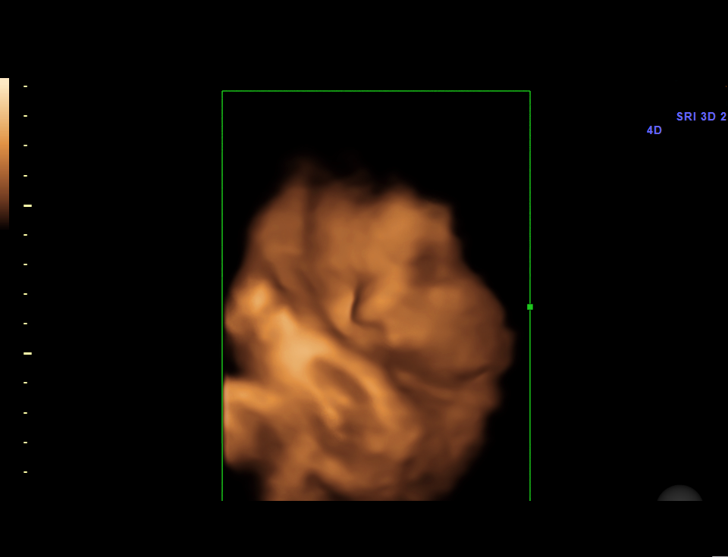
[im 41/43]
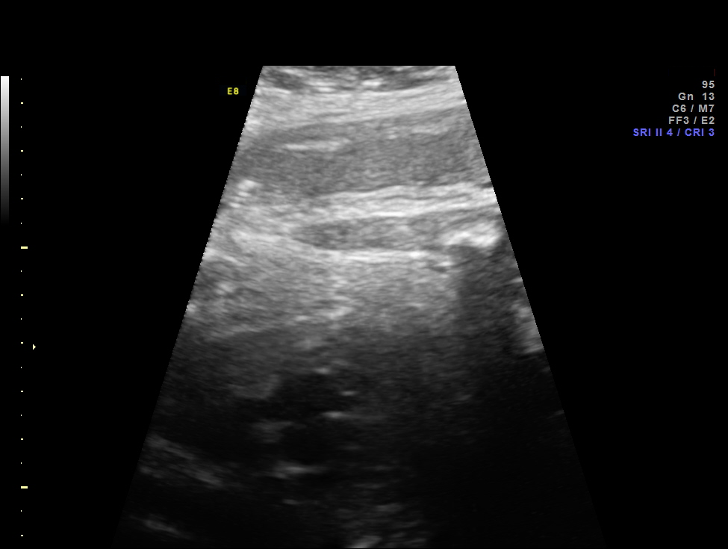

[12 of 28 positions shown; findings below may reference images not displayed]

OBSTETRICS REPORT
                      (Signed Final [DATE] [DATE])

Service(s) Provided

 US OB FOLLOW UP                                       76816.1
Indications

 Hypertension - Chronic/Pre-existing; on Atenolol
 Cigarette smoker
Fetal Evaluation

 Num Of Fetuses:    1
 Fetal Heart Rate:  132                          bpm
 Cardiac Activity:  Observed
 Presentation:      Cephalic
 Placenta:          Anterior, above cervical os
 P. Cord            Previously Visualized
 Insertion:

 Amniotic Fluid
 AFI FV:      Subjectively within normal limits
 AFI Sum:     11.78   cm       36  %Tile     Larg Pckt:    4.77  cm
 RUQ:   3.28    cm   LUQ:    3.73   cm    LLQ:   4.77    cm
Biophysical Evaluation

 Amniotic F.V:   Pocket => 2 cm two         F. Tone:        Observed
                 planes
 F. Movement:    Observed                   Score:          [DATE]
 F. Breathing:   Observed
Biometry

 BPD:     89.3  mm     G. Age:  36w 1d                CI:         80.9   70 - 86
 OFD:    110.4  mm                                    FL/HC:      19.7   20.1 -

 HC:     319.4  mm     G. Age:  36w 0d       15  %    HC/AC:      0.99   0.93 -

 AC:     322.4  mm     G. Age:  36w 1d       54  %    FL/BPD:     70.3   71 - 87
 FL:      62.8  mm     G. Age:  32w 4d      < 3  %    FL/AC:      19.5   20 - 24
 HUM:     55.5  mm     G. Age:  32w 2d      < 5  %
 CER:     47.5  mm     G. Age:  N/A        > 95  %

 Est. FW:    [SQ]  gm    5 lb 12 oz      36  %
Gestational Age

 LMP:           37w 1d        Date:  [DATE]                 EDD:   [DATE]
 U/S Today:     35w 1d                                        EDD:   [DATE]
 Best:          36w 3d     Det. By:  U/S C R L   ([DATE])   EDD:   [DATE]
Anatomy

 Cranium:          Appears normal         Aortic Arch:      Previously seen
 Fetal Cavum:      Appears normal         Ductal Arch:      Previously seen
 Ventricles:       Appears normal         Diaphragm:        Previously seen
 Choroid Plexus:   Previously seen        Stomach:          Appears normal, left
                                                            sided
 Cerebellum:       Previously seen        Abdomen:          Appears normal
 Posterior Fossa:  Previously seen        Abdominal Wall:   Previously seen
 Nuchal Fold:      Previously seen        Cord Vessels:     Previously seen
 Face:             Orbits and profile     Kidneys:          Appear normal
                   previously seen
 Lips:             Previously seen        Bladder:          Appears normal
 Heart:            Appears normal         Spine:            Previously seen
                   (4CH, axis, and
                   situs)
 RVOT:             Previously seen        Lower             Previously seen
                                          Extremities:
 LVOT:             Previously seen        Upper             Previously seen
                                          Extremities:

 Other:  Male gender. Heels and 5th digit previously visualized.
Targeted Anatomy

 Fetal Central Nervous System
 Cisterna Magna:
Cervix Uterus Adnexa

 Cervix:       Not visualized (advanced GA >[SQ])
Impression

 IUP at 36+3 weeks
 Normal interval anatomy; anatomic survey complete
 Normal amniotic fluid volume
 Appropriate interval growth with EFW at the 36th %tile
 BPP [DATE]
Recommendations

 Continue twice weekly NSTs/BPPs with weekly AFIs

## 2013-02-13 LAB — CULTURE, BETA STREP (GROUP B ONLY)

## 2013-02-16 ENCOUNTER — Encounter: Payer: Self-pay | Admitting: *Deleted

## 2013-02-16 ENCOUNTER — Ambulatory Visit (INDEPENDENT_AMBULATORY_CARE_PROVIDER_SITE_OTHER): Payer: Medicaid Other | Admitting: *Deleted

## 2013-02-16 VITALS — BP 123/85 | HR 87

## 2013-02-16 DIAGNOSIS — O0990 Supervision of high risk pregnancy, unspecified, unspecified trimester: Secondary | ICD-10-CM

## 2013-02-16 DIAGNOSIS — O10019 Pre-existing essential hypertension complicating pregnancy, unspecified trimester: Secondary | ICD-10-CM

## 2013-02-16 DIAGNOSIS — O09299 Supervision of pregnancy with other poor reproductive or obstetric history, unspecified trimester: Secondary | ICD-10-CM

## 2013-02-16 DIAGNOSIS — O163 Unspecified maternal hypertension, third trimester: Secondary | ICD-10-CM

## 2013-02-16 DIAGNOSIS — O10013 Pre-existing essential hypertension complicating pregnancy, third trimester: Secondary | ICD-10-CM

## 2013-02-16 DIAGNOSIS — O0993 Supervision of high risk pregnancy, unspecified, third trimester: Secondary | ICD-10-CM

## 2013-02-16 DIAGNOSIS — O139 Gestational [pregnancy-induced] hypertension without significant proteinuria, unspecified trimester: Secondary | ICD-10-CM

## 2013-02-16 DIAGNOSIS — O99333 Smoking (tobacco) complicating pregnancy, third trimester: Secondary | ICD-10-CM

## 2013-02-16 DIAGNOSIS — O99334 Smoking (tobacco) complicating childbirth: Secondary | ICD-10-CM

## 2013-02-16 DIAGNOSIS — O09293 Supervision of pregnancy with other poor reproductive or obstetric history, third trimester: Secondary | ICD-10-CM

## 2013-02-16 NOTE — Progress Notes (Signed)
Patient is here due to she feels as if the baby is not moving as much and she is having a burning pain in her mid abdomen that she is concerned about.  NST today is reactive and reassuring and she reports that baby is moving well since NST started.  Her blood pressure is good and her urine dip in Negative for protein and urine.  She is reassured and will come to the office tomorrow for her regular ob follow up appointment.

## 2013-02-17 ENCOUNTER — Ambulatory Visit (INDEPENDENT_AMBULATORY_CARE_PROVIDER_SITE_OTHER): Payer: Medicaid Other | Admitting: Obstetrics & Gynecology

## 2013-02-17 VITALS — BP 114/78 | Wt 200.0 lb

## 2013-02-17 DIAGNOSIS — O0993 Supervision of high risk pregnancy, unspecified, third trimester: Secondary | ICD-10-CM

## 2013-02-17 DIAGNOSIS — O10019 Pre-existing essential hypertension complicating pregnancy, unspecified trimester: Secondary | ICD-10-CM

## 2013-02-17 DIAGNOSIS — O10013 Pre-existing essential hypertension complicating pregnancy, third trimester: Secondary | ICD-10-CM

## 2013-02-17 DIAGNOSIS — O0990 Supervision of high risk pregnancy, unspecified, unspecified trimester: Secondary | ICD-10-CM

## 2013-02-17 DIAGNOSIS — I10 Essential (primary) hypertension: Secondary | ICD-10-CM

## 2013-02-17 NOTE — Progress Notes (Signed)
BP normal, no symptoms.  NST performed today was reviewed and was found to be reactive.  Continue recommended antenatal testing and prenatal care.  02/12/13 EFW 2617g (5lb 12oz)/36%, AFI 11.78cm, cephalic, anterior placenta.  Preeclampsia, fetal movement and labor precautions reviewed.

## 2013-02-17 NOTE — Progress Notes (Signed)
P-84  

## 2013-02-17 NOTE — Patient Instructions (Signed)
Return to clinic for any obstetric concerns or go to [facility] for evaluation  

## 2013-02-18 ENCOUNTER — Encounter: Payer: Self-pay | Admitting: Obstetrics and Gynecology

## 2013-02-18 ENCOUNTER — Encounter (HOSPITAL_COMMUNITY): Payer: Self-pay | Admitting: *Deleted

## 2013-02-18 ENCOUNTER — Inpatient Hospital Stay (HOSPITAL_COMMUNITY)
Admission: AD | Admit: 2013-02-18 | Discharge: 2013-02-18 | Disposition: A | Discharge status: Home / Self Care | Payer: Medicaid Other | Source: Ambulatory Visit | Attending: Obstetrics & Gynecology | Admitting: Obstetrics & Gynecology

## 2013-02-18 ENCOUNTER — Ambulatory Visit (INDEPENDENT_AMBULATORY_CARE_PROVIDER_SITE_OTHER): Payer: Medicaid Other | Admitting: Obstetrics and Gynecology

## 2013-02-18 VITALS — BP 126/83 | Wt 200.0 lb

## 2013-02-18 DIAGNOSIS — I10 Essential (primary) hypertension: Secondary | ICD-10-CM

## 2013-02-18 DIAGNOSIS — O10019 Pre-existing essential hypertension complicating pregnancy, unspecified trimester: Secondary | ICD-10-CM | POA: Insufficient documentation

## 2013-02-18 DIAGNOSIS — O99333 Smoking (tobacco) complicating pregnancy, third trimester: Secondary | ICD-10-CM

## 2013-02-18 DIAGNOSIS — O99334 Smoking (tobacco) complicating childbirth: Secondary | ICD-10-CM

## 2013-02-18 DIAGNOSIS — O0990 Supervision of high risk pregnancy, unspecified, unspecified trimester: Secondary | ICD-10-CM

## 2013-02-18 DIAGNOSIS — O0993 Supervision of high risk pregnancy, unspecified, third trimester: Secondary | ICD-10-CM

## 2013-02-18 DIAGNOSIS — O09293 Supervision of pregnancy with other poor reproductive or obstetric history, third trimester: Secondary | ICD-10-CM

## 2013-02-18 DIAGNOSIS — O09299 Supervision of pregnancy with other poor reproductive or obstetric history, unspecified trimester: Secondary | ICD-10-CM

## 2013-02-18 DIAGNOSIS — O2943 Spinal and epidural anesthesia induced headache during pregnancy, third trimester: Secondary | ICD-10-CM

## 2013-02-18 DIAGNOSIS — H538 Other visual disturbances: Secondary | ICD-10-CM | POA: Insufficient documentation

## 2013-02-18 DIAGNOSIS — O10913 Unspecified pre-existing hypertension complicating pregnancy, third trimester: Secondary | ICD-10-CM

## 2013-02-18 DIAGNOSIS — O10013 Pre-existing essential hypertension complicating pregnancy, third trimester: Secondary | ICD-10-CM

## 2013-02-18 DIAGNOSIS — R51 Headache: Secondary | ICD-10-CM

## 2013-02-18 LAB — PROTEIN / CREATININE RATIO, URINE: Total Protein, Urine: <4 mg/dL

## 2013-02-18 LAB — COMPREHENSIVE METABOLIC PANEL
AST: 14 U/L (ref 0–37)
Albumin: 2.7 g/dL — ABNORMAL LOW (ref 3.5–5.2)
Calcium: 9.5 mg/dL (ref 8.4–10.5)
Chloride: 105 mEq/L (ref 96–112)
Creatinine, Ser: 0.47 mg/dL — ABNORMAL LOW (ref 0.50–1.10)
Total Protein: 6.2 g/dL (ref 6.0–8.3)

## 2013-02-18 LAB — CBC
MCH: 30.4 pg (ref 26.0–34.0)
MCV: 86 fL (ref 78.0–100.0)
Platelets: 286 10*3/uL (ref 150–400)
RDW: 13.5 % (ref 11.5–15.5)
WBC: 13.3 10*3/uL — ABNORMAL HIGH (ref 4.0–10.5)

## 2013-02-18 NOTE — MAU Provider Note (Addendum)
History     CSN: [id number]  Arrival date and time: 02/18/13 1034   First Provider Initiated Contact with Patient 02/18/13 1113      Chief Complaint  Patient presents with  . Hypertension   HPI Comments: [name] is presenting with new onset headache and blurry vision. She was at [facility] today trying to read the back of a medicine package and started to have blurry vision. Soon after, she started to see spots and zigzags with subsequent development of a headache. She normally takes tylenol for headaches, but has not used any today. She went to her mother's house to check her blood pressure and it read 140/89. She then went to her OB/gyn and had a normal blood pressure and was sent to the MAU for PIH workup. She has a history of chronic hypertension and migraines. She has had no nausea/vomiting, no diarrhea/constipation, no dysuria, no seizures no RUQ pain. She has no vaginal bleeding of leakage of fluid. She has about 10-15 non-painful irregular contractions throughout day. Fetal movement is present but baby has been moving a little less.   OB History   Grav Para Term Preterm Abortions TAB SAB Ect Mult Living   6 2 2  0 3 2 1  0 0 2      Past Medical History  Diagnosis Date  . Hypertension   . Migraine   . Gestational diabetes     with 1st pregnancy  . Urinary tract infection     Past Surgical History  Procedure Laterality Date  . Tonsillectomy  1988  . Tubes in ears  Baby  . Dilation and evacuation  03/09/2011    Procedure: DILATATION AND EVACUATION (D&E);  Surgeon: Reva Bores, MD;  Location: [facility];  Service: Gynecology;  Laterality: N/A;    Family History  Problem Relation Age of Onset  . Diabetes Mother   . Osteoarthritis Mother   . Hypertension Mother   . Hypothyroidism Mother   . Heart disease Mother   . Hyperlipidemia Mother   . Migraines Mother   . Mental illness Mother     Depression  . Hypertension Father   . Heart failure Father   . Hepatitis Father    . Cirrhosis Father   . Hyperlipidemia Father   . Migraines Father   . Mental illness Father     Depression  . Breast cancer Maternal Aunt 70  . Cancer Sister 79    Cervical  . Heart disease Sister     Heart Stops  . Migraines Sister   . Seizures Sister 11    unsure if this or heart problem  . Mental illness Sister     depression  . Heart disease Maternal Grandmother     History  Substance Use Topics  . Smoking status: Current Some Day Smoker -- 0.50 packs/day for 12 years    Types: Cigarettes  . Smokeless tobacco: Never Used  . Alcohol Use: No    Allergies: No Known Allergies  Prescriptions prior to admission  Medication Sig Dispense Refill  . acetaminophen (TYLENOL) 500 MG tablet Take 1,000 mg by mouth every 6 (six) hours as needed for pain.      Marland Kitchen atenolol (TENORMIN) 50 MG tablet Take 1 tablet (50 mg total) by mouth daily.  30 tablet  5  . calcium carbonate (TUMS - DOSED IN MG ELEMENTAL CALCIUM) 500 MG chewable tablet Chew 1-2 tablets by mouth daily as needed for heartburn.       Marland Kitchen  flintstones complete (FLINTSTONES) 60 MG chewable tablet Chew 2 tablets by mouth daily.         Review of Systems  Constitutional: Negative for fever.  Eyes: Positive for blurred vision.  Respiratory: Negative for shortness of breath.   Cardiovascular: Negative for chest pain.  Gastrointestinal: Negative for nausea, vomiting, diarrhea and constipation.  Genitourinary: Negative for dysuria, urgency and frequency.  Neurological: Positive for headaches. Negative for dizziness.   Physical Exam   Blood pressure 124/76, pulse 81, temperature 98 F (36.7 C), temperature source Oral, resp. rate 16, height 5\' 3"  (1.6 m), weight 90.447 kg (199 lb 6.4 oz), last menstrual period 05/28/2012, SpO2 98.00%.  Physical Exam  Constitutional: She is oriented to person, place, and time. She appears well-developed and well-nourished.  GI: There is no tenderness.  Musculoskeletal: Normal range of motion.  She exhibits edema. She exhibits no tenderness.       Right lower leg: She exhibits no tenderness.       Left lower leg: She exhibits no tenderness.  Neurological: She is alert and oriented to person, place, and time. She has normal reflexes. She exhibits normal muscle tone.  No clonus  Skin: Skin is warm and dry.   CBC    Component Value Date/Time   WBC 13.3* 02/18/2013 1138   RBC 3.49* 02/18/2013 1138   HGB 10.6* 02/18/2013 1138   HCT 30.0* 02/18/2013 1138   PLT 286 02/18/2013 1138   MCV 86.0 02/18/2013 1138   MCH 30.4 02/18/2013 1138   MCHC 35.3 02/18/2013 1138   RDW 13.5 02/18/2013 1138   LYMPHSABS 2.0 07/14/2012 1204   MONOABS 0.6 07/14/2012 1204   EOSABS 0.1 07/14/2012 1204   BASOSABS 0.0 07/14/2012 1204    CMP     Component Value Date/Time   NA 137 02/18/2013 1138   K 4.0 02/18/2013 1138   CL 105 02/18/2013 1138   CO2 21 02/18/2013 1138   GLUCOSE 84 02/18/2013 1138   BUN 8 02/18/2013 1138   CREATININE 0.47* 02/18/2013 1138   CREATININE 0.67 07/14/2012 1204   CALCIUM 9.5 02/18/2013 1138   PROT 6.2 02/18/2013 1138   ALBUMIN 2.7* 02/18/2013 1138   AST 14 02/18/2013 1138   ALT 11 02/18/2013 1138   ALKPHOS 106 02/18/2013 1138   BILITOT 0.2* 02/18/2013 1138   GFRNONAA >90 02/18/2013 1138   GFRAA >90 02/18/2013 1138   Protein/creatinine ratio: unable to calculate because protein below reportable range  MAU Course  Procedures  MDM - CBC to check platelets - Cmet to check liver enzymes - urine protein/creatinine ratio to check if elevated  Assessment and Plan  32 yo [id number] at [redacted]w[redacted]d with PMH of chronic hypertension currently being worked up for superimposed preeclampsia upon chronic hypertension and stable  # Chronic Hypertension - has had normal blood pressure recordings so far. Currently having no symptoms of blurry vision, RUQ pain or increased DTRs. Normal platelets, AST, ALT, and protein creatinine ratio. Still has a headache and is stable. - discharge home - follow-up on  Friday   Jacquelin Hawking, MD  02/18/2013, 11:13 AM   I spoke with and examined patient and agree with resident's note and plan of care.  Tawana Scale, MD OB Fellow 02/18/2013 1:26 PM

## 2013-02-18 NOTE — MAU Note (Signed)
Patient states she has some blurred vision and seeing spots this am, took her BP and it was elevated. Was seen at [facility] and sent to MAU for further evaluation.

## 2013-02-18 NOTE — Progress Notes (Signed)
Patient reports impaired vision while shopping at [facility] with the subsequent development of a bad headache. Patient states she went to her mother's house and checked her BP which was elevated 140/89. Patient sent to MAU for further evaluation. On call team informed.

## 2013-02-18 NOTE — Progress Notes (Signed)
Patient began having increased blurry vision a couple of hours ago along with a headache and her BP at home was 140/89 and after rest 138/88.

## 2013-02-19 ENCOUNTER — Other Ambulatory Visit: Payer: Self-pay | Admitting: Family Medicine

## 2013-02-19 DIAGNOSIS — O169 Unspecified maternal hypertension, unspecified trimester: Secondary | ICD-10-CM

## 2013-02-20 ENCOUNTER — Ambulatory Visit (HOSPITAL_COMMUNITY)
Admission: RE | Admit: 2013-02-20 | Discharge: 2013-02-20 | Disposition: A | Discharge status: Home / Self Care | Payer: Medicaid Other | Source: Ambulatory Visit | Attending: Family Medicine | Admitting: Family Medicine

## 2013-02-20 DIAGNOSIS — Z3689 Encounter for other specified antenatal screening: Secondary | ICD-10-CM | POA: Insufficient documentation

## 2013-02-20 DIAGNOSIS — O169 Unspecified maternal hypertension, unspecified trimester: Secondary | ICD-10-CM

## 2013-02-20 DIAGNOSIS — O10019 Pre-existing essential hypertension complicating pregnancy, unspecified trimester: Secondary | ICD-10-CM | POA: Insufficient documentation

## 2013-02-20 DIAGNOSIS — O99330 Smoking (tobacco) complicating pregnancy, unspecified trimester: Secondary | ICD-10-CM | POA: Insufficient documentation

## 2013-02-20 IMAGING — US US FETAL BPP W/O NONSTRESS
1 series · 6 of 6 positions shown · non-contrast
Comparison: none

[Series 1: us fetal bpp w/o nonstress · 0.26mm/px · 6 acquisitions, 6 frames shown]
[im 1/6]
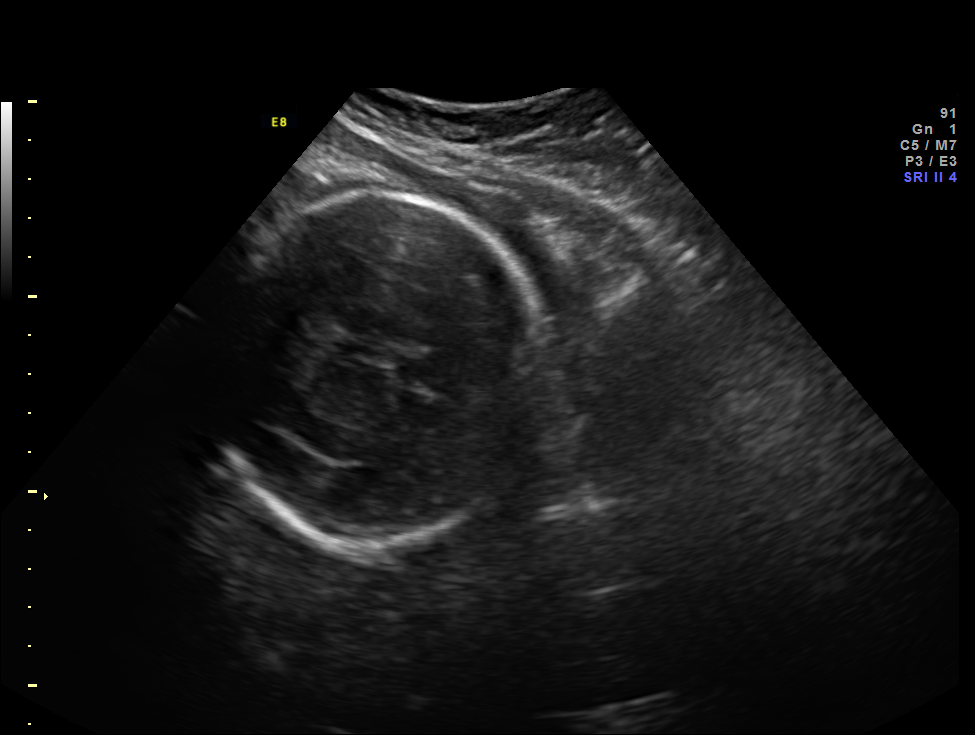
[im 2/6]
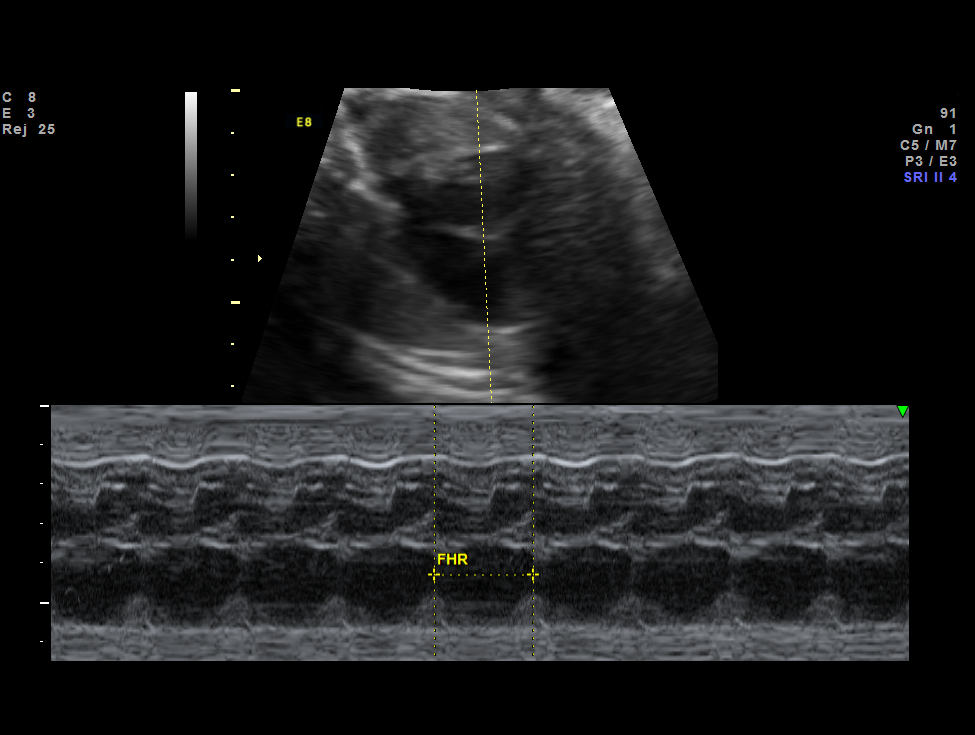
[im 3/6]
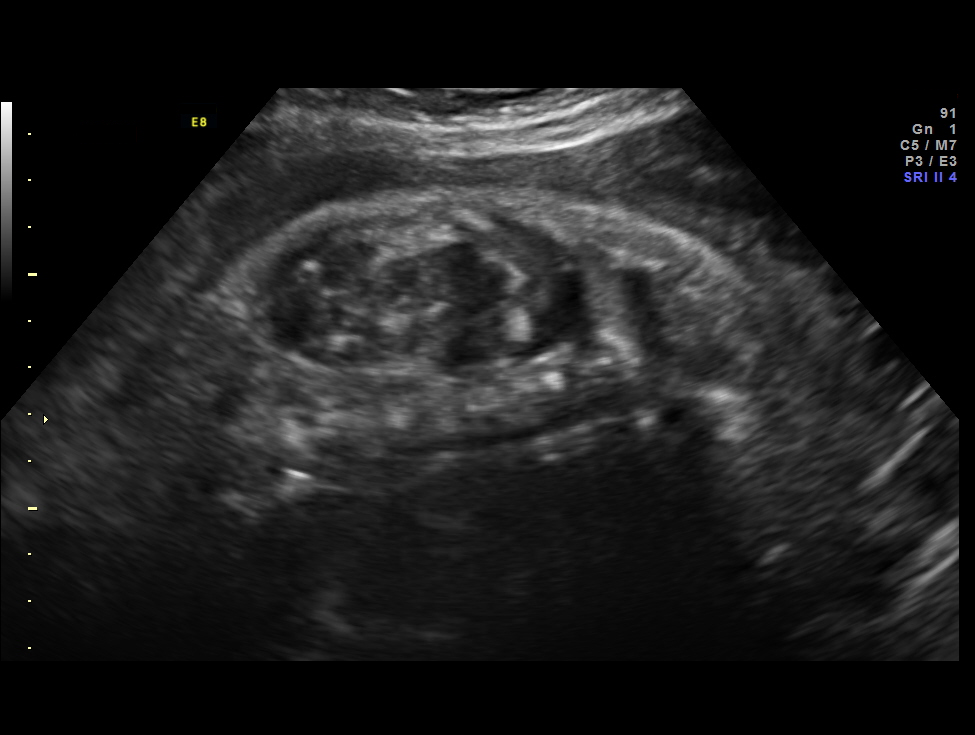
[im 4/6]
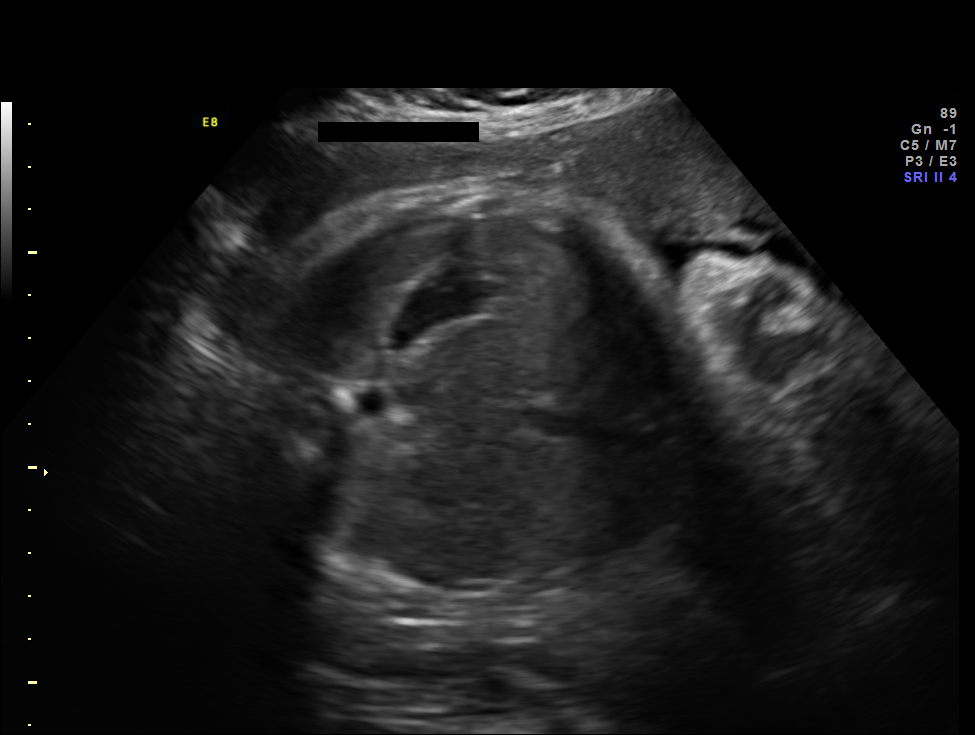
[im 5/6]
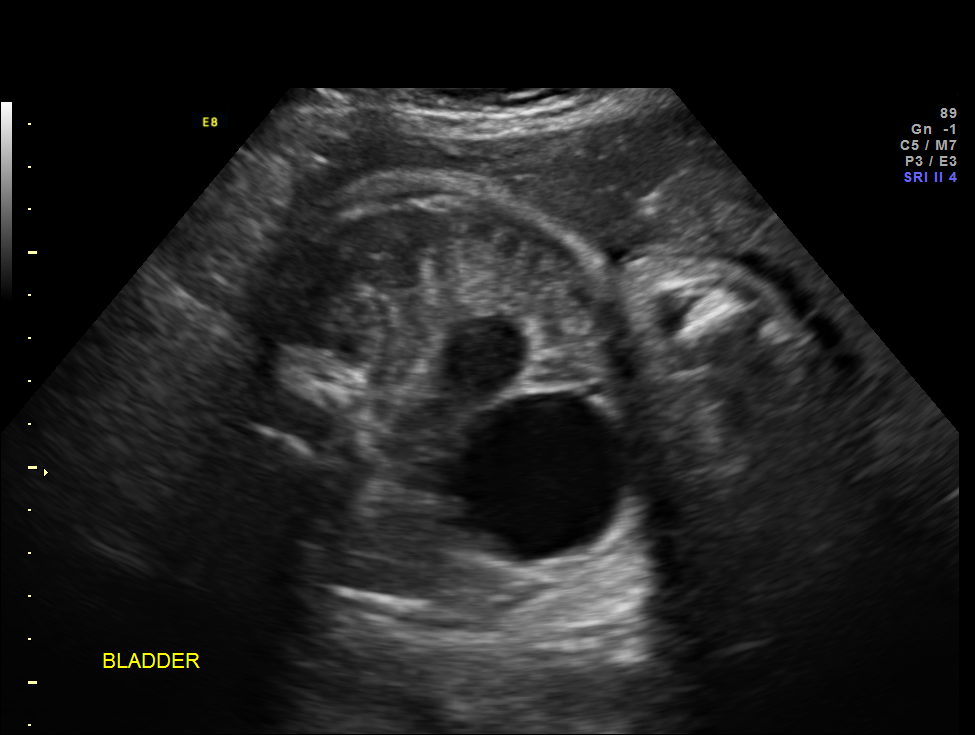
[im 6/6]
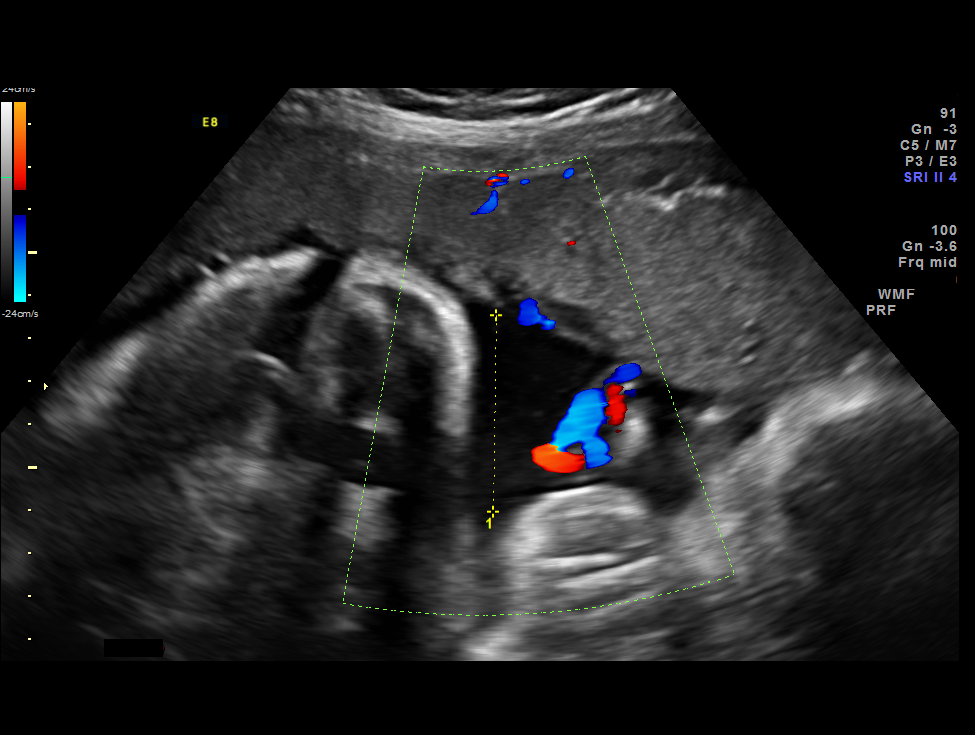

[6 of 6 positions shown; findings below may reference images not displayed]

OBSTETRICS REPORT
                      (Signed Final [DATE] [DATE])

Service(s) Provided

Indications

 Hypertension - Chronic/Pre-existing; on Atenolol
 Cigarette smoker
Fetal Evaluation

 Num Of Fetuses:    1
 Fetal Heart Rate:  136                          bpm
 Cardiac Activity:  Observed
 Presentation:      Cephalic
 Placenta:          Anterior, above cervical os
 P. Cord            Previously Visualized
 Insertion:

 Amniotic Fluid
 AFI FV:      Subjectively within normal limits
                                             Larg Pckt:     4.5  cm
Biophysical Evaluation

 Amniotic F.V:   Pocket => 2 cm two         F. Tone:        Observed
                 planes
 F. Movement:    Observed                   Score:          [DATE]
 F. Breathing:   Observed
Gestational Age

 LMP:           38w 2d        Date:  [DATE]                 EDD:   [DATE]
 Best:          37w 4d     Det. By:  U/S C R L   ([DATE])   EDD:   [DATE]
Impression

 IUP at 37+4 weeks
 CHTN on Atenolol, smoking history
 Normal amniotic fluid volume
 BPP [DATE]
Recommendations

 Continue twice weekly NSTs/BPPs with weekly AFIs
 Recommend delivery by EDD but not earlier that 39 weeks if
 testing reassuring.

## 2013-02-20 NOTE — Progress Notes (Signed)
[name]  was seen today for an ultrasound appointment.  See full report in [facility].  Impression: IUP at 37+4 weeks CHTN on Atenolol, smoking history Normal amniotic fluid volume  BPP 8/8   Recommendations: Continue twice weekly NSTs/BPPs with weekly AFIs   Recommend delivery by EDD but not earlier that 39 weeks if testing reassuring.  Alpha Gula, MD

## 2013-02-22 ENCOUNTER — Encounter (HOSPITAL_COMMUNITY): Payer: Self-pay | Admitting: Family

## 2013-02-22 ENCOUNTER — Inpatient Hospital Stay (HOSPITAL_COMMUNITY)
Admission: AD | Admit: 2013-02-22 | Discharge: 2013-02-22 | Disposition: A | Discharge status: Home / Self Care | Payer: Medicaid Other | Source: Ambulatory Visit | Attending: Obstetrics & Gynecology | Admitting: Obstetrics & Gynecology

## 2013-02-22 DIAGNOSIS — O368190 Decreased fetal movements, unspecified trimester, not applicable or unspecified: Secondary | ICD-10-CM | POA: Insufficient documentation

## 2013-02-22 DIAGNOSIS — O368130 Decreased fetal movements, third trimester, not applicable or unspecified: Secondary | ICD-10-CM

## 2013-02-22 LAB — URINALYSIS, ROUTINE W REFLEX MICROSCOPIC
Nitrite: NEGATIVE
Specific Gravity, Urine: <1.005 — ABNORMAL LOW (ref 1.005–1.030)
Urobilinogen, UA: 0.2 mg/dL (ref 0.0–1.0)

## 2013-02-22 NOTE — MAU Note (Signed)
Patient presents to MAU with c/o decreased fetal movement noted today. Denies VB, LOF or contractions.

## 2013-02-22 NOTE — MAU Provider Note (Signed)
History     CSN: [id number]  Arrival date and time: 02/22/13 1905   None     Chief Complaint  Patient presents with  . Decreased Fetal Movement   HPI [name] is a 32 y.o. [id number] at [redacted]w[redacted]d presents to triage for evaluation of decreased fetal movement since this morning. Patient otherwise in normal state of health. Patient denies loss of fluid, vaginal bleeding, contractions. Patient has headaches, vision changes, nausea, vomiting, diarrhea, constipation, weakness and chest pain, shortness of breath. No RUQ pain  OB History   Grav Para Term Preterm Abortions TAB SAB Ect Mult Living   6 2 2  0 3 2 1  0 0 2      Past Medical History  Diagnosis Date  . Hypertension   . Migraine   . Gestational diabetes     with 1st pregnancy  . Urinary tract infection     Past Surgical History  Procedure Laterality Date  . Tonsillectomy  1988  . Tubes in ears  Baby  . Dilation and evacuation  03/09/2011    Procedure: DILATATION AND EVACUATION (D&E);  Surgeon: Reva Bores, MD;  Location: [facility];  Service: Gynecology;  Laterality: N/A;    Family History  Problem Relation Age of Onset  . Diabetes Mother   . Osteoarthritis Mother   . Hypertension Mother   . Hypothyroidism Mother   . Heart disease Mother   . Hyperlipidemia Mother   . Migraines Mother   . Mental illness Mother     Depression  . Hypertension Father   . Heart failure Father   . Hepatitis Father   . Cirrhosis Father   . Hyperlipidemia Father   . Migraines Father   . Mental illness Father     Depression  . Breast cancer Maternal Aunt 70  . Cancer Sister 4    Cervical  . Heart disease Sister     Heart Stops  . Migraines Sister   . Seizures Sister 11    unsure if this or heart problem  . Mental illness Sister     depression  . Heart disease Maternal Grandmother     History  Substance Use Topics  . Smoking status: Current Some Day Smoker -- 0.50 packs/day for 12 years    Types: Cigarettes  . Smokeless  tobacco: Never Used  . Alcohol Use: 0.5 oz/week    1 drink(s) per week    Allergies: No Known Allergies  Prescriptions prior to admission  Medication Sig Dispense Refill  . acetaminophen (TYLENOL) 500 MG tablet Take 1,000 mg by mouth every 6 (six) hours as needed for pain.      Marland Kitchen atenolol (TENORMIN) 50 MG tablet Take 1 tablet (50 mg total) by mouth daily.  30 tablet  5  . calcium carbonate (TUMS - DOSED IN MG ELEMENTAL CALCIUM) 500 MG chewable tablet Chew 1-2 tablets by mouth daily as needed for heartburn.       . flintstones complete (FLINTSTONES) 60 MG chewable tablet Chew 2 tablets by mouth daily.         Review of Systems  Constitutional: Negative for fever and chills.  Eyes: Negative for blurred vision and double vision.  Respiratory: Negative for shortness of breath.   Cardiovascular: Negative for chest pain.  Gastrointestinal: Negative for nausea, vomiting, abdominal pain, diarrhea and constipation.  Genitourinary: Negative for dysuria, urgency, frequency, hematuria and flank pain.  Neurological: Negative for headaches.   Physical Exam   Blood pressure 127/81, pulse 84,  temperature 98.3 F (36.8 C), temperature source Oral, resp. rate 18, height 5\' 3"  (1.6 m), weight 90.266 kg (199 lb), last menstrual period 05/28/2012.  Filed Vitals:   02/22/13 1920 02/22/13 1921 02/22/13 1931 02/22/13 1946  BP: 141/89 141/89 141/89 127/81  Pulse: 94 94 90 84  Temp: 98.3 F (36.8 C)     TempSrc: Oral     Resp: 18     Height:   5\' 3"  (1.6 m)   Weight:   90.266 kg (199 lb)      Physical Exam  Nursing note and vitals reviewed. Constitutional: She appears well-developed and well-nourished. No distress.  HENT:  Head: Normocephalic and atraumatic.  Respiratory: Effort normal.  GI: Soft. Bowel sounds are normal. She exhibits no distension and no mass. There is no tenderness. There is no rebound and no guarding.  Skin: She is not diaphoretic.   [place]: SIUP, VTX, CM 143, AFI 10.9, Ant  Placenta  FHT: 110s mod variability, mult accels >15x15, no decels  MAU Course  Procedures  MDM Monitored with reactive NST and AFI. Pt reassured. Pt reports improvement in movemetn since arrival  Assessment and Plan  [name] is a 32 y.o. [id number] at [redacted]w[redacted]d with now resolved decreased fetal movement, reassuring NST/AFI. Pt reassured with labor precautions. Discussed kick counts, and pt discharged to home. Pt has follow up in 2 days.  Pt bp initially mildly elevated and improved to normal. Pt on atenolol and no sx of preeclampsia. Pt discharged to continue management in [facility].  Tawana Scale 02/22/2013, 8:01 PM

## 2013-02-24 ENCOUNTER — Other Ambulatory Visit: Payer: Self-pay | Admitting: Family Medicine

## 2013-02-24 ENCOUNTER — Ambulatory Visit (INDEPENDENT_AMBULATORY_CARE_PROVIDER_SITE_OTHER): Payer: Medicaid Other | Admitting: Family Medicine

## 2013-02-24 ENCOUNTER — Encounter: Payer: Self-pay | Admitting: Family Medicine

## 2013-02-24 VITALS — BP 130/78 | Wt 199.0 lb

## 2013-02-24 DIAGNOSIS — O10019 Pre-existing essential hypertension complicating pregnancy, unspecified trimester: Secondary | ICD-10-CM

## 2013-02-24 DIAGNOSIS — O10013 Pre-existing essential hypertension complicating pregnancy, third trimester: Secondary | ICD-10-CM

## 2013-02-24 DIAGNOSIS — O169 Unspecified maternal hypertension, unspecified trimester: Secondary | ICD-10-CM

## 2013-02-24 DIAGNOSIS — Z3480 Encounter for supervision of other normal pregnancy, unspecified trimester: Secondary | ICD-10-CM

## 2013-02-24 NOTE — Patient Instructions (Signed)
Pregnancy - Third Trimester The third trimester of pregnancy (the last 3 months) is a period of the most rapid growth for you and your baby. The baby approaches a length of 20 inches and a weight of 6 to 10 pounds. The baby is adding on fat and getting ready for life outside your body. While inside, babies have periods of sleeping and waking, sucking thumbs, and hiccuping. You can often feel small contractions of the uterus. This is false labor. It is also called Braxton-Hicks contractions. This is like a practice for labor. The usual problems in this stage of pregnancy include more difficulty breathing, swelling of the hands and feet from water retention, and having to urinate more often because of the uterus and baby pressing on your bladder.  PRENATAL EXAMS  Blood work may continue to be done during prenatal exams. These tests are done to check on your health and the probable health of your baby. Blood work is used to follow your blood levels (hemoglobin). Anemia (low hemoglobin) is common during pregnancy. Iron and vitamins are given to help prevent this. You may also continue to be checked for diabetes. Some of the past blood tests may be done again.  The size of the uterus is measured during each visit. This makes sure your baby is growing properly according to your pregnancy dates.  Your blood pressure is checked every prenatal visit. This is to make sure you are not getting toxemia.  Your urine is checked every prenatal visit for infection, diabetes, and protein.  Your weight is checked at each visit. This is done to make sure gains are happening at the suggested rate and that you and your baby are growing normally.  Sometimes, an ultrasound is performed to confirm the position and the proper growth and development of the baby. This is a test done that bounces harmless sound waves off the baby so your caregiver can more accurately determine a due date.  Discuss the type of pain medicine and  anesthesia you will have during your labor and delivery.  Discuss the possibility and anesthesia if a cesarean section might be necessary.  Inform your caregiver if there is any mental or physical violence at home. Sometimes, a specialized non-stress test, contraction stress test, and biophysical profile are done to make sure the baby is not having a problem. Checking the amniotic fluid surrounding the baby is called an amniocentesis. The amniotic fluid is removed by sticking a needle into the belly (abdomen). This is sometimes done near the end of pregnancy if an early delivery is required. In this case, it is done to help make sure the baby's lungs are mature enough for the baby to live outside of the womb. If the lungs are not mature and it is unsafe to deliver the baby, an injection of cortisone medicine is given to the mother 1 to 2 days before the delivery. This helps the baby's lungs mature and makes it safer to deliver the baby. CHANGES OCCURING IN THE THIRD TRIMESTER OF PREGNANCY Your body goes through many changes during pregnancy. They vary from person to person. Talk to your caregiver about changes you notice and are concerned about.  During the last trimester, you have probably had an increase in your appetite. It is normal to have cravings for certain foods. This varies from person to person and pregnancy to pregnancy.  You may begin to get stretch marks on your hips, abdomen, and breasts. These are normal changes in the body   during pregnancy. There are no exercises or medicines to take which prevent this change.  Constipation may be treated with a stool softener or adding bulk to your diet. Drinking lots of fluids, fiber in vegetables, fruits, and whole grains are helpful.  Exercising is also helpful. If you have been very active up until your pregnancy, most of these activities can be continued during your pregnancy. If you have been less active, it is helpful to start an exercise  program such as walking. Consult your caregiver before starting exercise programs.  Avoid all smoking, alcohol, non-prescribed drugs, herbs and "street drugs" during your pregnancy. These chemicals affect the formation and growth of the baby. Avoid chemicals throughout the pregnancy to ensure the delivery of a healthy infant.  Backache, varicose veins, and hemorrhoids may develop or get worse.  You will tire more easily in the third trimester, which is normal.  The baby's movements may be stronger and more often.  You may become short of breath easily.  Your belly button may stick out.  A yellow discharge may leak from your breasts called colostrum.  You may have a bloody mucus discharge. This usually occurs a few days to a week before labor begins. HOME CARE INSTRUCTIONS   Keep your caregiver's appointments. Follow your caregiver's instructions regarding medicine use, exercise, and diet.  During pregnancy, you are providing food for you and your baby. Continue to eat regular, well-balanced meals. Choose foods such as meat, fish, milk and other low fat dairy products, vegetables, fruits, and whole-grain breads and cereals. Your caregiver will tell you of the ideal weight gain.  A physical sexual relationship may be continued throughout pregnancy if there are no other problems such as early (premature) leaking of amniotic fluid from the membranes, vaginal bleeding, or belly (abdominal) pain.  Exercise regularly if there are no restrictions. Check with your caregiver if you are unsure of the safety of your exercises. Greater weight gain will occur in the last 2 trimesters of pregnancy. Exercising helps:  Control your weight.  Get you in shape for labor and delivery.  You lose weight after you deliver.  Rest a lot with legs elevated, or as needed for leg cramps or low back pain.  Wear a good support or jogging bra for breast tenderness during pregnancy. This may help if worn during  sleep. Pads or tissues may be used in the bra if you are leaking colostrum.  Do not use hot tubs, steam rooms, or saunas.  Wear your seat belt when driving. This protects you and your baby if you are in an accident.  Avoid raw meat, cat litter boxes and soil used by cats. These carry germs that can cause birth defects in the baby.  It is easier to leak urine during pregnancy. Tightening up and strengthening the pelvic muscles will help with this problem. You can practice stopping your urination while you are going to the bathroom. These are the same muscles you need to strengthen. It is also the muscles you would use if you were trying to stop from passing gas. You can practice tightening these muscles up 10 times a set and repeating this about 3 times per day. Once you know what muscles to tighten up, do not perform these exercises during urination. It is more likely to cause an infection by backing up the urine.  Ask for help if you have financial, counseling, or nutritional needs during pregnancy. Your caregiver will be able to offer counseling for these   needs as well as refer you for other special needs.  Make a list of emergency phone numbers and have them available.  Plan on getting help from family or friends when you go home from the hospital.  Make a trial run to the hospital.  Take prenatal classes with the father to understand, practice, and ask questions about the labor and delivery.  Prepare the baby's room or nursery.  Do not travel out of the city unless it is absolutely necessary and with the advice of your caregiver.  Wear only low or no heal shoes to have better balance and prevent falling. MEDICINES AND DRUG USE IN PREGNANCY  Take prenatal vitamins as directed. The vitamin should contain 1 milligram of folic acid. Keep all vitamins out of reach of children. Only a couple vitamins or tablets containing iron may be fatal to a baby or young child when ingested.  Avoid use  of all medicines, including herbs, over-the-counter medicines, not prescribed or suggested by your caregiver. Only take over-the-counter or prescription medicines for pain, discomfort, or fever as directed by your caregiver. Do not use aspirin, ibuprofen or naproxen unless approved by your caregiver.  Let your caregiver also know about herbs you may be using.  Alcohol is related to a number of birth defects. This includes fetal alcohol syndrome. All alcohol, in any form, should be avoided completely. Smoking will cause low birth rate and premature babies.  Illegal drugs are very harmful to the baby. They are absolutely forbidden. A baby born to an addicted mother will be addicted at birth. The baby will go through the same withdrawal an adult does. SEEK MEDICAL CARE IF: You have any concerns or worries during your pregnancy. It is better to call with your questions if you feel they cannot wait, rather than worry about them. SEEK IMMEDIATE MEDICAL CARE IF:   An unexplained oral temperature above 102 F (38.9 C) develops, or as your caregiver suggests.  You have leaking of fluid from the vagina. If leaking membranes are suspected, take your temperature and tell your caregiver of this when you call.  There is vaginal spotting, bleeding or passing clots. Tell your caregiver of the amount and how many pads are used.  You develop a bad smelling vaginal discharge with a change in the color from clear to white.  You develop vomiting that lasts more than 24 hours.  You develop chills or fever.  You develop shortness of breath.  You develop burning on urination.  You loose more than 2 pounds of weight or gain more than 2 pounds of weight or as suggested by your caregiver.  You notice sudden swelling of your face, hands, and feet or legs.  You develop belly (abdominal) pain. Round ligament discomfort is a common non-cancerous (benign) cause of abdominal pain in pregnancy. Your caregiver still  must evaluate you.  You develop a severe headache that does not go away.  You develop visual problems, blurred or double vision.  If you have not felt your baby move for more than 1 hour. If you think the baby is not moving as much as usual, eat something with sugar in it and lie down on your left side for an hour. The baby should move at least 4 to 5 times per hour. Call right away if your baby moves less than that.  You fall, are in a car accident, or any kind of trauma.  There is mental or physical violence at home. Document Released: 06/12/2001   Document Revised: 03/12/2012 Document Reviewed: 12/15/2008 [employer] Patient Information 2014 [employer].  Breastfeeding A change in hormones during your pregnancy causes growth of your breast tissue and an increase in number and size of milk ducts. The hormone prolactin allows proteins, sugars, and fats from your blood supply to make breast milk in your milk-producing glands. The hormone progesterone prevents breast milk from being released before the birth of your baby. After the birth of your baby, your progesterone level decreases allowing breast milk to be released. Thoughts of your baby, as well as his or her sucking or crying, can stimulate the release of milk from the milk-producing glands. Deciding to breastfeed (nurse) is one of the best choices you can make for you and your baby. The information that follows gives a brief review of the benefits, as well as other important skills to know about breastfeeding. BENEFITS OF BREASTFEEDING For your baby  The first milk (colostrum) helps your baby's digestive system function better.   There are antibodies in your milk that help your baby fight off infections.   Your baby has a lower incidence of asthma, allergies, and sudden infant death syndrome (SIDS).   The nutrients in breast milk are better for your baby than infant formulas.  Breast milk improves your baby's brain development.    Your baby will have less gas, colic, and constipation.  Your baby is less likely to develop other conditions, such as childhood obesity, asthma, or diabetes mellitus. For you  Breastfeeding helps develop a very special bond between you and your baby.   Breastfeeding is convenient, always available at the correct temperature, and costs nothing.   Breastfeeding helps to burn calories and helps you lose the weight gained during pregnancy.   Breastfeeding makes your uterus contract back down to normal size faster and slows bleeding following delivery.   Breastfeeding mothers have a lower risk of developing osteoporosis or breast or ovarian cancer later in life.  BREASTFEEDING FREQUENCY  A healthy, full-term baby may breastfeed as often as every hour or space his or her feedings to every 3 hours. Breastfeeding frequency will vary from baby to baby.   Newborns should be fed no less than every 2 3 hours during the day and every 4 5 hours during the night. You should breastfeed a minimum of 8 feedings in a 24 hour period.  Awaken your baby to breastfeed if it has been 3 4 hours since the last feeding.  Breastfeed when you feel the need to reduce the fullness of your breasts or when your newborn shows signs of hunger. Signs that your baby may be hungry include:  Increased alertness or activity.  Stretching.  Movement of the head from side to side.  Movement of the head and opening of the mouth when the corner of the mouth or cheek is stroked (rooting).  Increased sucking sounds, smacking lips, cooing, sighing, or squeaking.  Hand-to-mouth movements.  Increased sucking of fingers or hands.  Fussing.  Intermittent crying.  Signs of extreme hunger will require calming and consoling before you try to feed your baby. Signs of extreme hunger may include:  Restlessness.  A loud, strong cry.  Screaming.  Frequent feeding will help you make more milk and will help prevent  problems, such as sore nipples and engorgement of the breasts.  BREASTFEEDING   Whether lying down or sitting, be sure that the baby's abdomen is facing your abdomen.   Support your breast with 4 fingers under your breast   and your thumb above your nipple. Make sure your fingers are well away from your nipple and your baby's mouth.   Stroke your baby's lips gently with your finger or nipple.   When your baby's mouth is open wide enough, place all of your nipple and as much of the colored area around your nipple (areola) as possible into your baby's mouth.  More areola should be visible above his or her upper lip than below his or her lower lip.  Your baby's tongue should be between his or her lower gum and your breast.  Ensure that your baby's mouth is correctly positioned around the nipple (latched). Your baby's lips should create a seal on your breast.  Signs that your baby has effectively latched onto your nipple include:  Tugging or sucking without pain.  Swallowing heard between sucks.  Absent click or smacking sound.  Muscle movement above and in front of his or her ears with sucking.  Your baby must suck about 2 3 minutes in order to get your milk. Allow your baby to feed on each breast as long as he or she wants. Nurse your baby until he or she unlatches or falls asleep at the first breast, then offer the second breast.  Signs that your baby is full and satisfied include:  A gradual decrease in the number of sucks or complete cessation of sucking.  Falling asleep.  Extension or relaxation of his or her body.  Retention of a small amount of milk in his or her mouth.  Letting go of your breast by himself or herself.  Signs of effective breastfeeding in you include:  Breasts that have increased firmness, weight, and size prior to feeding.  Breasts that are softer after nursing.  Increased milk volume, as well as a change in milk consistency and color by the 5th  day of breastfeeding.  Breast fullness relieved by breastfeeding.  Nipples are not sore, cracked, or bleeding.  If needed, break the suction by putting your finger into the corner of your baby's mouth and sliding your finger between his or her gums. Then, remove your breast from his or her mouth.  It is common for babies to spit up a small amount after a feeding.  Babies often swallow air during feeding. This can make babies fussy. Burping your baby between breasts can help with this.  Vitamin D supplements are recommended for babies who get only breast milk.  Avoid using a pacifier during your baby's first 4 6 weeks.  Avoid supplemental feedings of water, formula, or juice in place of breastfeeding. Breast milk is all the food your baby needs. It is not necessary for your baby to have water or formula. Your breasts will make more milk if supplemental feedings are avoided during the early weeks. HOW TO TELL WHETHER YOUR BABY IS GETTING ENOUGH BREAST MILK Wondering whether or not your baby is getting enough milk is a common concern among mothers. You can be assured that your baby is getting enough milk if:   Your baby is actively sucking and you hear swallowing.   Your baby seems relaxed and satisfied after a feeding.   Your baby nurses at least 8 12 times in a 24 hour time period.  During the first 3 5 days of age:  Your baby is wetting at least 3 5 diapers in a 24 hour period. The urine should be clear and pale yellow.  Your baby is having at least 3 4 stools in   a 24 hour period. The stool should be soft and yellow.  At 5 7 days of age, your baby is having at least 3 6 stools in a 24 hour period. The stool should be seedy and yellow by 5 days of age.  Your baby has a weight loss less than 7 10% during the first 3 days of age.  Your baby does not lose weight after 3 7 days of age.  Your baby gains 4 7 ounces each week after he or she is 4 days of age.  Your baby gains weight  by 5 days of age and is back to birth weight within 2 weeks. ENGORGEMENT In the first week after your baby is born, you may experience extremely full breasts (engorgement). When engorged, your breasts may feel heavy, warm, or tender to the touch. Engorgement peaks within 24 48 hours after delivery of your baby.  Engorgement may be reduced by:  Continuing to breastfeed.  Increasing the frequency of breastfeeding.  Taking warm showers or applying warm, moist heat to your breasts just before each feeding. This increases circulation and helps the milk flow.   Gently massaging your breast before and during the feedings. With your fingertips, massage from your chest wall towards your nipple in a circular motion.   Ensuring that your baby empties at least one breast at every feeding. It also helps to start the next feeding on the opposite breast.   Expressing breast milk by hand or by using a breast pump to empty the breasts if your baby is sleepy, or not nursing well. You may also want to express milk if you are returning to work oryou feel you are getting engorged.  Ensuring your baby is latched on and positioned properly while breastfeeding. If you follow these suggestions, your engorgement should improve in 24 48 hours. If you are still experiencing difficulty, call your lactation consultant or caregiver.  CARING FOR YOURSELF Take care of your breasts.  Bathe or shower daily.   Avoid using soap on your nipples.   Wear a supportive bra. Avoid wearing underwire style bras.  Air dry your nipples for a 3 4minutes after each feeding.   Use only cotton bra pads to absorb breast milk leakage. Leaking of breast milk between feedings is normal.   Use only pure lanolin on your nipples after nursing. You do not need to wash it off before feeding your baby again. Another option is to express a few drops of breast milk and gently massage that milk into your nipples.  Continue breast  self-awareness checks. Take care of yourself.  Eat healthy foods. Alternate 3 meals with 3 snacks.  Avoid foods that you notice affect your baby in a bad way.  Drink milk, fruit juice, and water to satisfy your thirst (about 8 glasses a day).   Rest often, relax, and take your prenatal vitamins to prevent fatigue, stress, and anemia.  Avoid chewing and smoking tobacco.  Avoid alcohol and drug use.  Take over-the-counter and prescribed medicine only as directed by your caregiver or pharmacist. You should always check with your caregiver or pharmacist before taking any new medicine, vitamin, or herbal supplement.  Know that pregnancy is possible while breastfeeding. If desired, talk to your caregiver about family planning and safe birth control methods that may be used while breastfeeding. SEEK MEDICAL CARE IF:   You feel like you want to stop breastfeeding or have become frustrated with breastfeeding.  You have painful breasts or nipples.    Your nipples are cracked or bleeding.  Your breasts are red, tender, or warm.  You have a swollen area on either breast.  You have a fever or chills.  You have nausea or vomiting.  You have drainage from your nipples.  Your breasts do not become full before feedings by the 5th day after delivery.  You feel sad and depressed.  Your baby is too sleepy to eat well.  Your baby is having trouble sleeping.   Your baby is wetting less than 3 diapers in a 24 hour period.  Your baby has less than 3 stools in a 24 hour period.  Your baby's skin or the white part of his or her eyes becomes more yellow.   Your baby is not gaining weight by 5 days of age. MAKE SURE YOU:   Understand these instructions.  Will watch your condition.  Will get help right away if you are not doing well or get worse. Document Released: 06/18/2005 Document Revised: 03/12/2012 Document Reviewed: 01/23/2012 [employer] Patient Information 2014 [employer].  

## 2013-02-24 NOTE — Progress Notes (Signed)
Reports some spotting, like bloody show.  No pain.  For NST--membranes stripped. IOL at 39 wks

## 2013-02-24 NOTE — Assessment & Plan Note (Signed)
Continue 2x/wk testing with delivery at 39 wks.

## 2013-02-26 ENCOUNTER — Inpatient Hospital Stay (HOSPITAL_COMMUNITY)
Admission: AD | Admit: 2013-02-26 | Discharge: 2013-02-26 | Disposition: A | Discharge status: Home / Self Care | Payer: Medicaid Other | Source: Ambulatory Visit | Attending: Obstetrics & Gynecology | Admitting: Obstetrics & Gynecology

## 2013-02-26 ENCOUNTER — Encounter (HOSPITAL_COMMUNITY): Payer: Self-pay | Admitting: *Deleted

## 2013-02-26 DIAGNOSIS — B9789 Other viral agents as the cause of diseases classified elsewhere: Secondary | ICD-10-CM | POA: Insufficient documentation

## 2013-02-26 DIAGNOSIS — R51 Headache: Secondary | ICD-10-CM | POA: Insufficient documentation

## 2013-02-26 DIAGNOSIS — O99891 Other specified diseases and conditions complicating pregnancy: Secondary | ICD-10-CM | POA: Insufficient documentation

## 2013-02-26 DIAGNOSIS — J069 Acute upper respiratory infection, unspecified: Secondary | ICD-10-CM

## 2013-02-26 DIAGNOSIS — O479 False labor, unspecified: Secondary | ICD-10-CM | POA: Insufficient documentation

## 2013-02-26 DIAGNOSIS — J3489 Other specified disorders of nose and nasal sinuses: Secondary | ICD-10-CM | POA: Insufficient documentation

## 2013-02-26 LAB — URINALYSIS, ROUTINE W REFLEX MICROSCOPIC
Bilirubin Urine: NEGATIVE
Ketones, ur: NEGATIVE mg/dL
Nitrite: NEGATIVE
Protein, ur: NEGATIVE mg/dL

## 2013-02-26 LAB — POCT FERN TEST

## 2013-02-26 LAB — URINE MICROSCOPIC-ADD ON

## 2013-02-26 MED ORDER — MORPHINE SULFATE 4 MG/ML IJ SOLN
8.0000 mg | Freq: Once | INTRAMUSCULAR | Status: AC
  Administered 2013-02-26: 8 mg via INTRAMUSCULAR
  Filled 2013-02-26: qty 2

## 2013-02-26 MED ORDER — DIPHENOXYLATE-ATROPINE 2.5-0.025 MG PO TABS
2.0000 | ORAL_TABLET | Freq: Once | ORAL | Status: AC
  Administered 2013-02-26: 2 via ORAL
  Filled 2013-02-26: qty 2

## 2013-02-26 MED ORDER — ACETAMINOPHEN 325 MG PO TABS
650.0000 mg | ORAL_TABLET | Freq: Once | ORAL | Status: AC
  Administered 2013-02-26: 650 mg via ORAL
  Filled 2013-02-26: qty 2

## 2013-02-26 MED ORDER — HYDROCODONE-HOMATROPINE 5-1.5 MG/5ML PO SYRP: 5.0000 mL | ORAL_SOLUTION | Freq: Four times a day (QID) | ORAL | Status: DC | PRN

## 2013-02-26 NOTE — MAU Provider Note (Addendum)
History     CSN: [id number]  Arrival date and time: 02/26/13 1807   None     Chief Complaint  Patient presents with  . Contractions  . Nasal Congestion   HPI  Pt is a [redacted]w[redacted]d here with report of nasal congestion and coughing that started yesterday.  Also reports contractions.  No report of vaginal bleeding.  Noticed a thin discharge with coughing.  +fetal movement.  +headache today.  No report of epigastric pain.  +fetal movement.  Pt reports history of fast labors.    Past Medical History  Diagnosis Date  . Hypertension   . Migraine   . Gestational diabetes     with 1st pregnancy  . Urinary tract infection     Past Surgical History  Procedure Laterality Date  . Tonsillectomy  1988  . Tubes in ears  Baby  . Dilation and evacuation  03/09/2011    Procedure: DILATATION AND EVACUATION (D&E);  Surgeon: Reva Bores, MD;  Location: [facility];  Service: Gynecology;  Laterality: N/A;    Family History  Problem Relation Age of Onset  . Diabetes Mother   . Osteoarthritis Mother   . Hypertension Mother   . Hypothyroidism Mother   . Heart disease Mother   . Hyperlipidemia Mother   . Migraines Mother   . Mental illness Mother     Depression  . Hypertension Father   . Heart failure Father   . Hepatitis Father   . Cirrhosis Father   . Hyperlipidemia Father   . Migraines Father   . Mental illness Father     Depression  . Breast cancer Maternal Aunt 70  . Cancer Sister 53    Cervical  . Heart disease Sister     Heart Stops  . Migraines Sister   . Seizures Sister 11    unsure if this or heart problem  . Mental illness Sister     depression  . Heart disease Maternal Grandmother     History  Substance Use Topics  . Smoking status: Current Some Day Smoker -- 0.50 packs/day for 12 years    Types: Cigarettes  . Smokeless tobacco: Never Used  . Alcohol Use: 0.5 oz/week    1 drink(s) per week    Allergies: No Known Allergies  Prescriptions prior to admission   Medication Sig Dispense Refill  . acetaminophen (TYLENOL) 500 MG tablet Take 1,000 mg by mouth every 6 (six) hours as needed for pain.      Marland Kitchen atenolol (TENORMIN) 50 MG tablet Take 1 tablet (50 mg total) by mouth daily.  30 tablet  5  . calcium carbonate (TUMS - DOSED IN MG ELEMENTAL CALCIUM) 500 MG chewable tablet Chew 1-2 tablets by mouth daily as needed for heartburn.       . flintstones complete (FLINTSTONES) 60 MG chewable tablet Chew 2 tablets by mouth daily.         Review of Systems  Constitutional: Negative for fever and chills.  HENT: Positive for congestion and sore throat (yesterday).   Eyes: Negative for blurred vision and double vision.  Respiratory: Positive for cough. Negative for shortness of breath and wheezing.   Gastrointestinal: Positive for abdominal pain (contractions) and diarrhea (loose stools x 4 in past 24 hours).  Genitourinary: Negative.   Neurological: Positive for headaches.   Physical Exam   Blood pressure 135/86, pulse 101, temperature 98.7 F (37.1 C), temperature source Oral, resp. rate 18, last menstrual period 05/28/2012, SpO2 97.00%.  Physical  Exam  Constitutional: She is oriented to person, place, and time. She appears well-developed and well-nourished. No distress.  HENT:  Head: Normocephalic.  Mouth/Throat: Mucous membranes are normal. Mucous membranes are not dry. No oropharyngeal exudate or posterior oropharyngeal edema.  Neck: Normal range of motion. Neck supple.  Cardiovascular: Normal rate, regular rhythm and normal heart sounds.   Respiratory: Effort normal and breath sounds normal.  Genitourinary: No bleeding around the vagina. Vaginal discharge (mucusy) found.  Neurological: She is alert and oriented to person, place, and time.  Skin: Skin is warm and dry.   Dilation: 2.5 Effacement (%): 50 Exam by:: Madalyn Rob Muhammed CNM  MAU Course  Procedures  2005 Pt unable to urinate due to cramping with diarrhea 2010 Lomotil ordered and pt  put on contact precautions 2050 Cervix rechecked 3-4/50/-2 > will recheck in one hour.    Results for orders placed during the hospital encounter of 02/26/13 (from the past 24 hour(s))  POCT FERN TEST     Status: Normal   Collection Time    02/26/13  7:36 PM      Result Value Range   POCT Fern Test      URINALYSIS, ROUTINE W REFLEX MICROSCOPIC     Status: Abnormal   Collection Time    02/26/13  9:30 PM      Result Value Range   Color, Urine YELLOW  YELLOW   APPearance CLEAR  CLEAR   Specific Gravity, Urine 1.020  1.005 - 1.030   pH 6.0  5.0 - 8.0   Glucose, UA NEGATIVE  NEGATIVE mg/dL   Hgb urine dipstick SMALL (*) NEGATIVE   Bilirubin Urine NEGATIVE  NEGATIVE   Ketones, ur NEGATIVE  NEGATIVE mg/dL   Protein, ur NEGATIVE  NEGATIVE mg/dL   Urobilinogen, UA 0.2  0.0 - 1.0 mg/dL   Nitrite NEGATIVE  NEGATIVE   Leukocytes, UA TRACE (*) NEGATIVE  URINE MICROSCOPIC-ADD ON     Status: None   Collection Time    02/26/13  9:30 PM      Result Value Range   Squamous Epithelial / LPF RARE  RARE   WBC, UA 0-2  <3 WBC/hpf   RBC / HPF 3-6  <3 RBC/hpf   Urine-Other MUCOUS PRESENT      Assessment and Plan  32 yo [id number] at 38.3 wks IUP Viral Illness Category I FHR Tracing  Plan: Discharge to home Increase fluids Rest Return for worsening or no improvement in symptoms.     2110 Report given to Despina Hick who assumes care of patient [facility] 02/26/2013, 7:11 PM   10:28 PM COntractions have spaced out, no cervical change.  OFfered therapeutic rest and accepted.  Labor precautions given

## 2013-02-26 NOTE — MAU Provider Note (Signed)
Attestation of Attending Supervision of Advanced Practitioner (PA/CNM/NP): Evaluation and management procedures were performed by the Advanced Practitioner under my supervision and collaboration.  I have reviewed the Advanced Practitioner's note and chart, and I agree with the management and plan.  Suhey Radford, MD, FACOG Attending Obstetrician & Gynecologist Faculty Practice, [facility]  

## 2013-02-26 NOTE — MAU Note (Signed)
Upper respiratory sxs started yesterday, congestion, coughing, HA. States has taken tylenol. States she has had some diarrhea also. Having contractions as well.

## 2013-02-27 ENCOUNTER — Ambulatory Visit (HOSPITAL_COMMUNITY)
Admission: RE | Admit: 2013-02-27 | Discharge: 2013-02-27 | Disposition: A | Discharge status: Home / Self Care | Payer: Medicaid Other | Source: Ambulatory Visit | Attending: Family Medicine | Admitting: Family Medicine

## 2013-02-27 ENCOUNTER — Telehealth (HOSPITAL_COMMUNITY): Payer: Self-pay | Admitting: *Deleted

## 2013-02-27 DIAGNOSIS — O10019 Pre-existing essential hypertension complicating pregnancy, unspecified trimester: Secondary | ICD-10-CM | POA: Insufficient documentation

## 2013-02-27 DIAGNOSIS — O169 Unspecified maternal hypertension, unspecified trimester: Secondary | ICD-10-CM

## 2013-02-27 DIAGNOSIS — O99330 Smoking (tobacco) complicating pregnancy, unspecified trimester: Secondary | ICD-10-CM | POA: Insufficient documentation

## 2013-02-27 IMAGING — US US FETAL BPP W/O NONSTRESS
1 series · 14 of 14 positions shown · non-contrast
Comparison: none

[Series 1: us fetal bpp w/o nonstress · 0.23mm/px · 14 acquisitions, 14 frames shown]
[im 1/14]
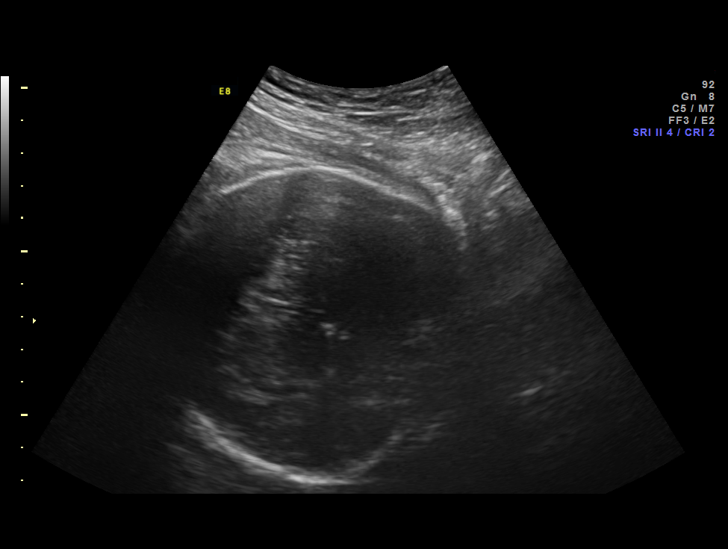
[im 2/14]
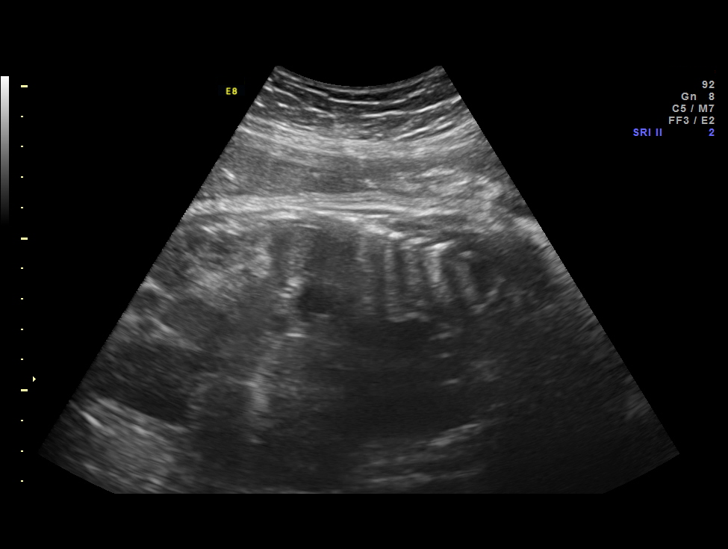
[im 3/14]
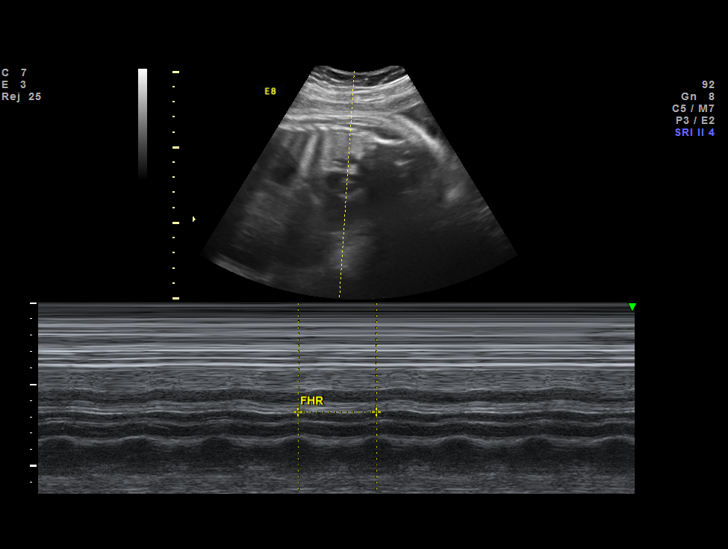
[im 4/14]
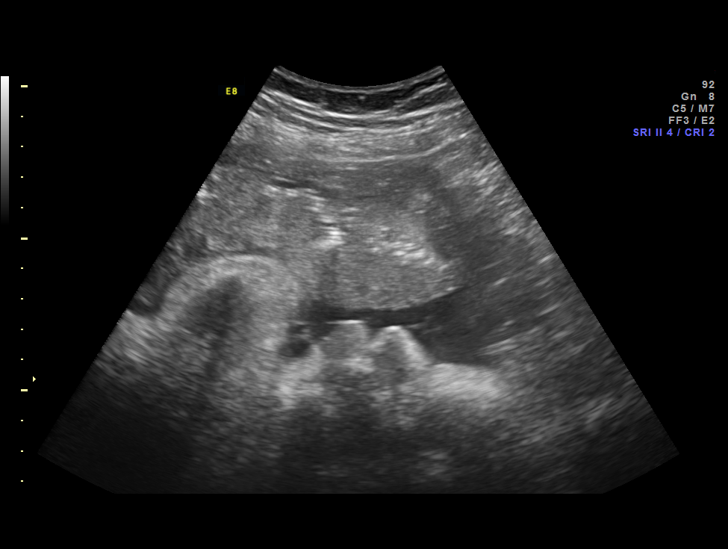
[im 5/14]
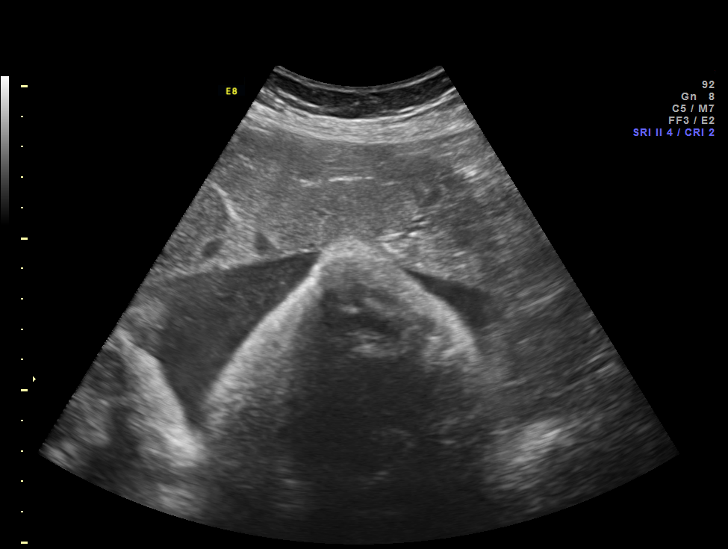
[im 6/14]
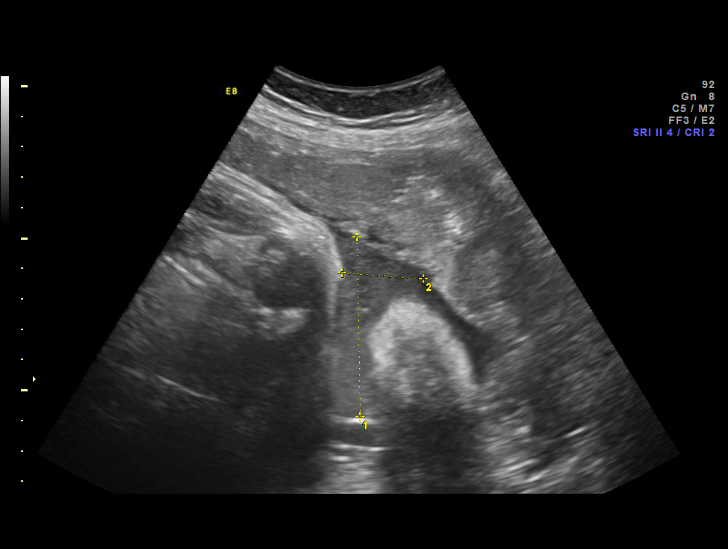
[im 7/14]
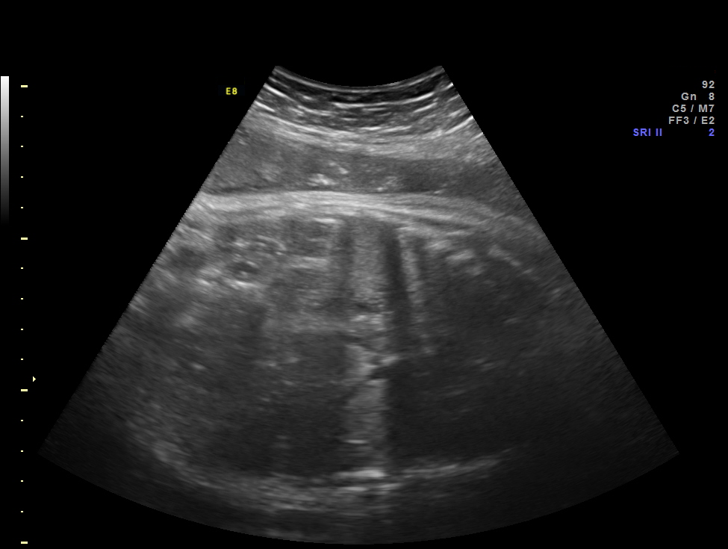
[im 8/14]
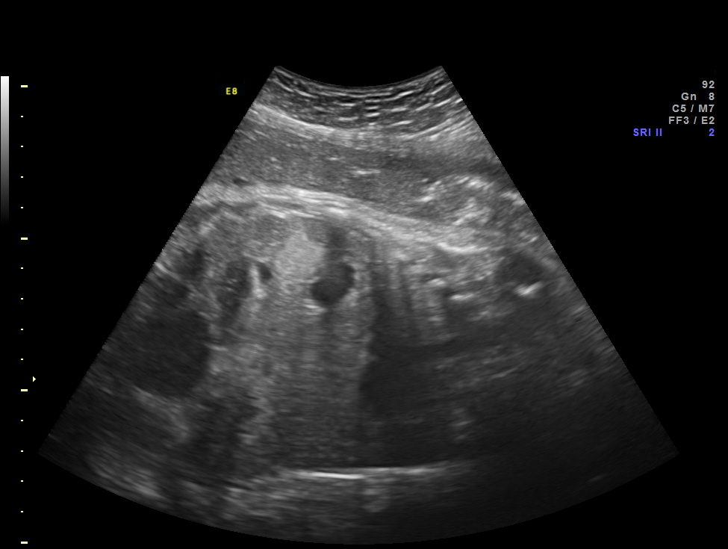
[im 9/14]
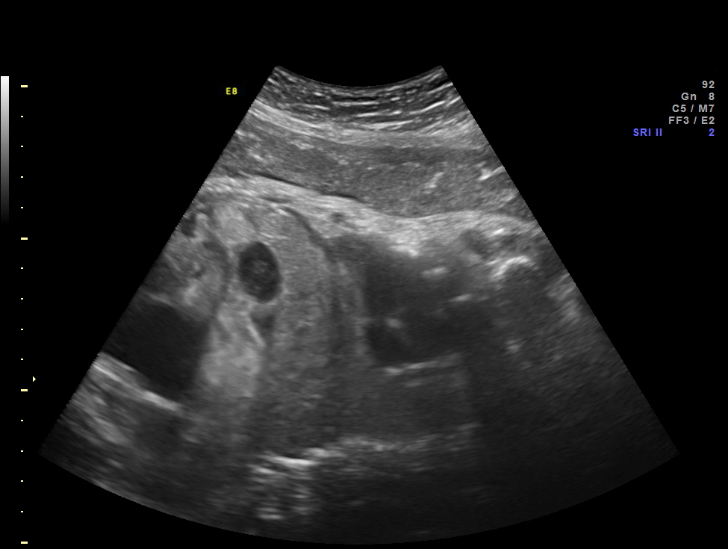
[im 10/14]
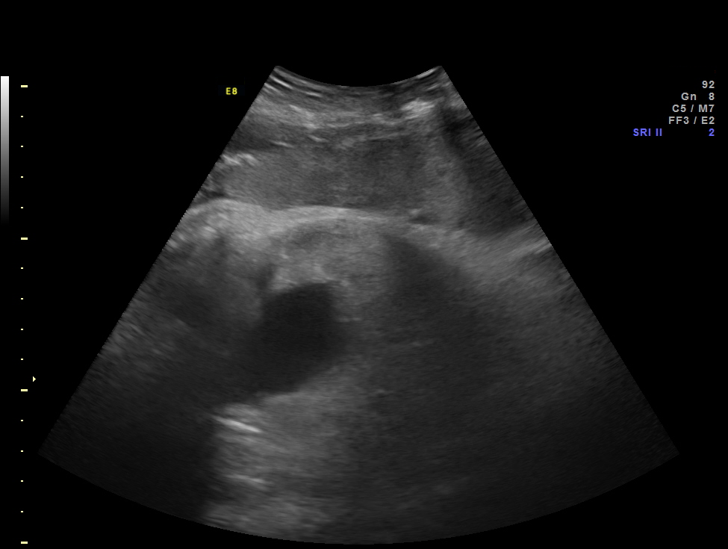
[im 11/14]
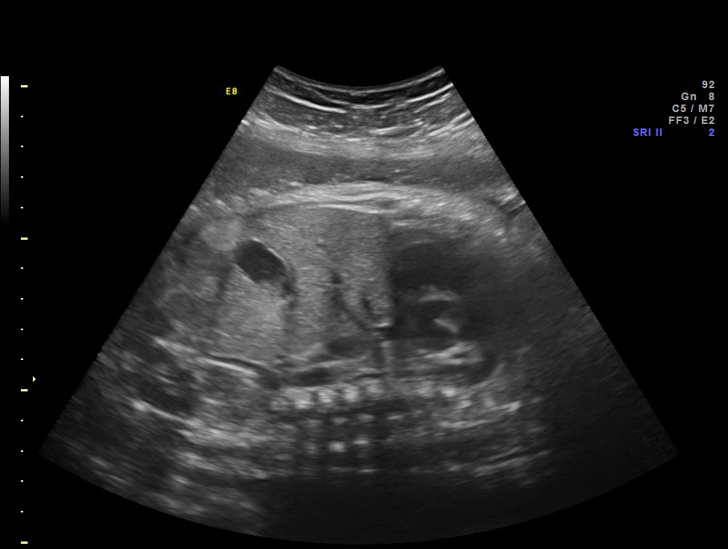
[im 12/14]
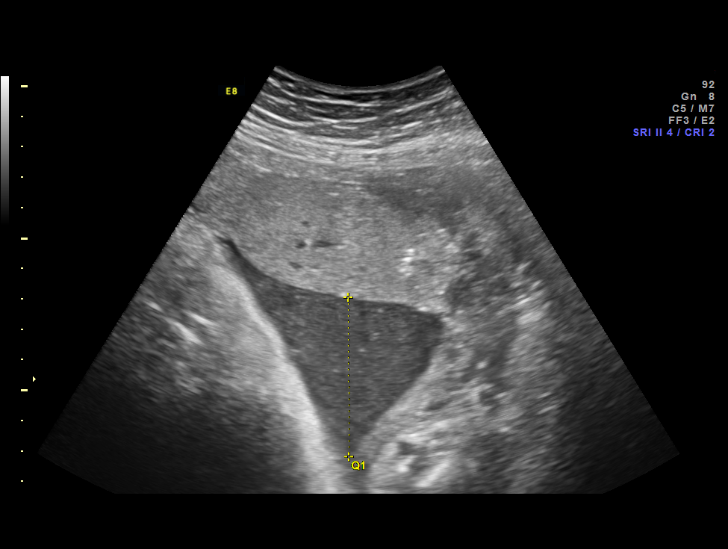
[im 13/14]
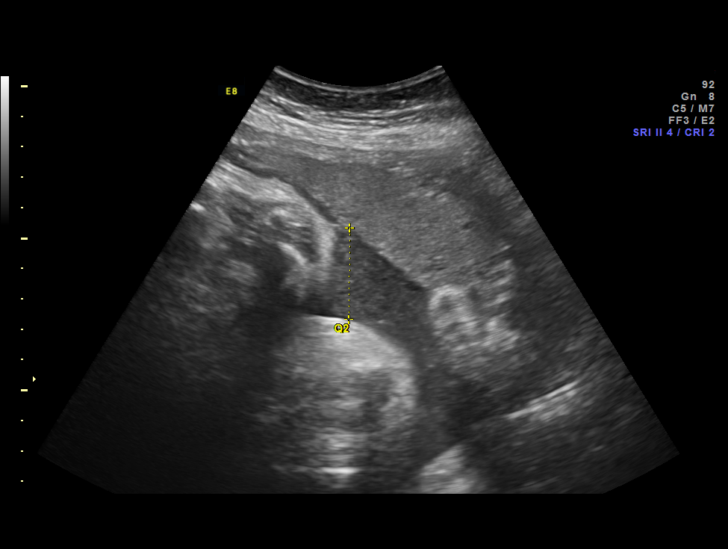
[im 14/14]
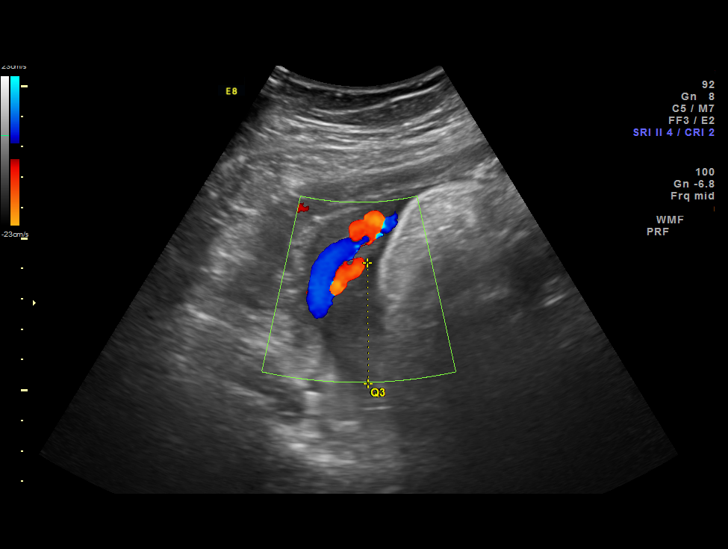

[14 of 14 positions shown; findings below may reference images not displayed]

Canned report from images found in remote index.

Refer to host system for actual result text.

## 2013-02-27 NOTE — Telephone Encounter (Signed)
Preadmission screen  

## 2013-02-27 NOTE — Progress Notes (Signed)
[name]  was seen today for an ultrasound appointment.  See full report in [facility].  Impression: IUP at 38+4 weeks CHTN on Atenolol, smoking history Normal amniotic fluid volume  BPP 8/8   Recommendations: Follow-up ultrasounds as clinically indicated.  Tentatively scheduled for induction of labor next week.  Alpha Gula, MD

## 2013-02-27 NOTE — ED Notes (Signed)
Pt states she was in MAU last night for a labor check.  States her cervix was 3-4cm dilated with some spotting. States she received morphine last night to help relax and slow the ctx.  Woke up this morning and had some more spotting and called provider.  They told her she might need an NST added today to monitor for contractions.  No call was made to office regarding this.  Pt states she isn't having regular contractions this morning.  Will discuss with Dr. Claudean Severance.

## 2013-02-27 NOTE — MAU Provider Note (Signed)

## 2013-03-01 ENCOUNTER — Inpatient Hospital Stay (HOSPITAL_COMMUNITY)
Admission: AD | Admit: 2013-03-01 | Discharge: 2013-03-02 | DRG: 774 | Disposition: A | Discharge status: Home / Self Care | Payer: Medicaid Other | Source: Ambulatory Visit | Attending: Obstetrics & Gynecology | Admitting: Obstetrics & Gynecology

## 2013-03-01 ENCOUNTER — Inpatient Hospital Stay (HOSPITAL_COMMUNITY): Payer: Medicaid Other | Admitting: Anesthesiology

## 2013-03-01 ENCOUNTER — Encounter (HOSPITAL_COMMUNITY): Payer: Self-pay | Admitting: Anesthesiology

## 2013-03-01 ENCOUNTER — Encounter (HOSPITAL_COMMUNITY): Payer: Self-pay | Admitting: *Deleted

## 2013-03-01 DIAGNOSIS — O99333 Smoking (tobacco) complicating pregnancy, third trimester: Secondary | ICD-10-CM

## 2013-03-01 DIAGNOSIS — O1002 Pre-existing essential hypertension complicating childbirth: Secondary | ICD-10-CM

## 2013-03-01 DIAGNOSIS — O09293 Supervision of pregnancy with other poor reproductive or obstetric history, third trimester: Secondary | ICD-10-CM

## 2013-03-01 DIAGNOSIS — O10013 Pre-existing essential hypertension complicating pregnancy, third trimester: Secondary | ICD-10-CM

## 2013-03-01 DIAGNOSIS — O0993 Supervision of high risk pregnancy, unspecified, third trimester: Secondary | ICD-10-CM

## 2013-03-01 LAB — CBC
MCH: 29.5 pg (ref 26.0–34.0)
Platelets: 284 10*3/uL (ref 150–400)
RBC: 3.66 MIL/uL — ABNORMAL LOW (ref 3.87–5.11)
WBC: 11.5 10*3/uL — ABNORMAL HIGH (ref 4.0–10.5)

## 2013-03-01 LAB — RPR: RPR Ser Ql: NONREACTIVE

## 2013-03-01 LAB — TYPE AND SCREEN

## 2013-03-01 LAB — ABO/RH: ABO/RH(D): AB POS

## 2013-03-01 MED ORDER — DIPHENHYDRAMINE HCL 25 MG PO CAPS: 25.0000 mg | ORAL_CAPSULE | Freq: Four times a day (QID) | ORAL | Status: DC | PRN

## 2013-03-01 MED ORDER — BENZOCAINE-MENTHOL 20-0.5 % EX AERO
1.0000 "application " | INHALATION_SPRAY | CUTANEOUS | Status: DC | PRN
  Administered 2013-03-01: 1 via TOPICAL
  Filled 2013-03-01: qty 56

## 2013-03-01 MED ORDER — WITCH HAZEL-GLYCERIN EX PADS: 1.0000 "application " | MEDICATED_PAD | CUTANEOUS | Status: DC | PRN

## 2013-03-01 MED ORDER — EPHEDRINE 5 MG/ML INJ
10.0000 mg | INTRAVENOUS | Status: DC | PRN
  Filled 2013-03-01: qty 2

## 2013-03-01 MED ORDER — ONDANSETRON HCL 4 MG/2ML IJ SOLN: 4.0000 mg | INTRAMUSCULAR | Status: DC | PRN

## 2013-03-01 MED ORDER — LACTATED RINGERS IV SOLN: 500.0000 mL | Freq: Once | INTRAVENOUS | Status: DC

## 2013-03-01 MED ORDER — LIDOCAINE HCL (PF) 1 % IJ SOLN
30.0000 mL | INTRAMUSCULAR | Status: DC | PRN
  Filled 2013-03-01 (×2): qty 30

## 2013-03-01 MED ORDER — FENTANYL CITRATE 0.05 MG/ML IJ SOLN
INTRAMUSCULAR | Status: AC
  Filled 2013-03-01: qty 2

## 2013-03-01 MED ORDER — LACTATED RINGERS IV SOLN
INTRAVENOUS | Status: DC
  Administered 2013-03-01 (×2): via INTRAVENOUS

## 2013-03-01 MED ORDER — CITRIC ACID-SODIUM CITRATE 334-500 MG/5ML PO SOLN: 30.0000 mL | ORAL | Status: DC | PRN

## 2013-03-01 MED ORDER — PRENATAL MULTIVITAMIN CH
1.0000 | ORAL_TABLET | Freq: Every day | ORAL | Status: DC
  Administered 2013-03-01 – 2013-03-02 (×2): 1 via ORAL
  Filled 2013-03-01: qty 1

## 2013-03-01 MED ORDER — OXYTOCIN 40 UNITS IN LACTATED RINGERS INFUSION - SIMPLE MED
62.5000 mL/h | INTRAVENOUS | Status: DC
  Administered 2013-03-01 (×2): 62.5 mL/h via INTRAVENOUS
  Filled 2013-03-01: qty 1000

## 2013-03-01 MED ORDER — SIMETHICONE 80 MG PO CHEW: 80.0000 mg | CHEWABLE_TABLET | ORAL | Status: DC | PRN

## 2013-03-01 MED ORDER — OXYCODONE-ACETAMINOPHEN 5-325 MG PO TABS
1.0000 | ORAL_TABLET | ORAL | Status: DC | PRN
  Administered 2013-03-01 – 2013-03-02 (×2): 1 via ORAL
  Filled 2013-03-01 (×2): qty 1

## 2013-03-01 MED ORDER — SODIUM BICARBONATE 8.4 % IV SOLN
INTRAVENOUS | Status: DC | PRN
  Administered 2013-03-01: 5 mL via EPIDURAL

## 2013-03-01 MED ORDER — IBUPROFEN 600 MG PO TABS
600.0000 mg | ORAL_TABLET | Freq: Four times a day (QID) | ORAL | Status: DC
  Administered 2013-03-01 – 2013-03-02 (×5): 600 mg via ORAL
  Filled 2013-03-01 (×5): qty 1

## 2013-03-01 MED ORDER — LACTATED RINGERS IV SOLN: 500.0000 mL | INTRAVENOUS | Status: DC | PRN

## 2013-03-01 MED ORDER — OXYTOCIN BOLUS FROM INFUSION
500.0000 mL | INTRAVENOUS | Status: DC
  Administered 2013-03-01: 500 mL via INTRAVENOUS

## 2013-03-01 MED ORDER — PHENYLEPHRINE 40 MCG/ML (10ML) SYRINGE FOR IV PUSH (FOR BLOOD PRESSURE SUPPORT)
80.0000 ug | PREFILLED_SYRINGE | INTRAVENOUS | Status: DC | PRN
  Filled 2013-03-01: qty 2

## 2013-03-01 MED ORDER — FENTANYL 2.5 MCG/ML BUPIVACAINE 1/10 % EPIDURAL INFUSION (WH - ANES)
14.0000 mL/h | INTRAMUSCULAR | Status: DC | PRN
  Administered 2013-03-01: 14 mL/h via EPIDURAL
  Filled 2013-03-01: qty 125

## 2013-03-01 MED ORDER — EPHEDRINE 5 MG/ML INJ
10.0000 mg | INTRAVENOUS | Status: DC | PRN
  Filled 2013-03-01: qty 2
  Filled 2013-03-01: qty 4

## 2013-03-01 MED ORDER — FLEET ENEMA 7-19 GM/118ML RE ENEM: 1.0000 | ENEMA | RECTAL | Status: DC | PRN

## 2013-03-01 MED ORDER — ACETAMINOPHEN 325 MG PO TABS: 650.0000 mg | ORAL_TABLET | ORAL | Status: DC | PRN

## 2013-03-01 MED ORDER — ONDANSETRON HCL 4 MG/2ML IJ SOLN: 4.0000 mg | Freq: Four times a day (QID) | INTRAMUSCULAR | Status: DC | PRN

## 2013-03-01 MED ORDER — TETANUS-DIPHTH-ACELL PERTUSSIS 5-2.5-18.5 LF-MCG/0.5 IM SUSP: 0.5000 mL | Freq: Once | INTRAMUSCULAR | Status: DC

## 2013-03-01 MED ORDER — OXYCODONE-ACETAMINOPHEN 5-325 MG PO TABS: 1.0000 | ORAL_TABLET | ORAL | Status: DC | PRN

## 2013-03-01 MED ORDER — ATENOLOL 50 MG PO TABS
50.0000 mg | ORAL_TABLET | Freq: Every day | ORAL | Status: DC
  Administered 2013-03-02: 50 mg via ORAL
  Filled 2013-03-01 (×4): qty 1

## 2013-03-01 MED ORDER — PNEUMOCOCCAL VAC POLYVALENT 25 MCG/0.5ML IJ INJ
0.5000 mL | INJECTION | INTRAMUSCULAR | Status: AC
  Filled 2013-03-01: qty 0.5

## 2013-03-01 MED ORDER — ONDANSETRON HCL 4 MG PO TABS: 4.0000 mg | ORAL_TABLET | ORAL | Status: DC | PRN

## 2013-03-01 MED ORDER — SENNOSIDES-DOCUSATE SODIUM 8.6-50 MG PO TABS
2.0000 | ORAL_TABLET | Freq: Every day | ORAL | Status: DC
  Administered 2013-03-02: 2 via ORAL

## 2013-03-01 MED ORDER — DIBUCAINE 1 % RE OINT: 1.0000 "application " | TOPICAL_OINTMENT | RECTAL | Status: DC | PRN

## 2013-03-01 MED ORDER — LANOLIN HYDROUS EX OINT: TOPICAL_OINTMENT | CUTANEOUS | Status: DC | PRN

## 2013-03-01 MED ORDER — ZOLPIDEM TARTRATE 5 MG PO TABS: 5.0000 mg | ORAL_TABLET | Freq: Every evening | ORAL | Status: DC | PRN

## 2013-03-01 MED ORDER — IBUPROFEN 600 MG PO TABS: 600.0000 mg | ORAL_TABLET | Freq: Four times a day (QID) | ORAL | Status: DC | PRN

## 2013-03-01 MED ORDER — FENTANYL CITRATE 0.05 MG/ML IJ SOLN
100.0000 ug | INTRAMUSCULAR | Status: DC | PRN
  Administered 2013-03-01: 100 ug via INTRAVENOUS
  Filled 2013-03-01: qty 2

## 2013-03-01 MED ORDER — PHENYLEPHRINE 40 MCG/ML (10ML) SYRINGE FOR IV PUSH (FOR BLOOD PRESSURE SUPPORT)
80.0000 ug | PREFILLED_SYRINGE | INTRAVENOUS | Status: DC | PRN
  Filled 2013-03-01: qty 2
  Filled 2013-03-01: qty 5

## 2013-03-01 MED ORDER — DIPHENHYDRAMINE HCL 50 MG/ML IJ SOLN: 12.5000 mg | INTRAMUSCULAR | Status: DC | PRN

## 2013-03-01 NOTE — Anesthesia Preprocedure Evaluation (Addendum)
Anesthesia Evaluation  Patient identified by MRN, date of birth, ID band Patient awake    Reviewed: Allergy & Precautions, H&P , Patient's Chart, lab work & pertinent test results  Airway Mallampati: II TM Distance: >3 FB Neck ROM: full    Dental  (+) Teeth Intact   Pulmonary  breath sounds clear to auscultation        Cardiovascular hypertension, On Medications Rhythm:regular Rate:Normal     Neuro/Psych    GI/Hepatic   Endo/Other    Renal/GU      Musculoskeletal   Abdominal   Peds  Hematology   Anesthesia Other Findings       Reproductive/Obstetrics (+) Pregnancy                         Anesthesia Physical Anesthesia Plan  ASA: II  Anesthesia Plan: Epidural   Post-op Pain Management:    Induction:   Airway Management Planned:   Additional Equipment:   Intra-op Plan:   Post-operative Plan:   Informed Consent:   Plan Discussed with:   Anesthesia Plan Comments:         Anesthesia Quick Evaluation

## 2013-03-01 NOTE — Anesthesia Procedure Notes (Signed)

## 2013-03-01 NOTE — MAU Note (Signed)
Pt reprots leaking for the past 2 hours and having contractions as well good fetal movement reproted.

## 2013-03-01 NOTE — H&P (Signed)
[name] is a 32 y.o. female presenting for SROM at 0600 with clear fluid. Pregnancy complicated by [name] and normal monitoring. Normal Fetal movement, +CTX, +Spotting. Maternal Medical History:  Reason for admission: Nausea.     OB History   Grav Para Term Preterm Abortions TAB SAB Ect Mult Living   6 2 2  0 3 2 1  0 0 2     Past Medical History  Diagnosis Date  . Hypertension   . Migraine   . Gestational diabetes     with 1st pregnancy  . Urinary tract infection    Past Surgical History  Procedure Laterality Date  . Tonsillectomy  1988  . Tubes in ears  Baby  . Dilation and evacuation  03/09/2011    Procedure: DILATATION AND EVACUATION (D&E);  Surgeon: Shannon Bores, MD;  Location: [facility];  Service: Gynecology;  Laterality: N/A;   Family History: family history includes Breast cancer (age of onset: 43) in her maternal aunt; Cancer (age of onset: 62) in her sister; Cirrhosis in her father; Diabetes in her mother; Heart disease in her maternal grandmother, mother, and sister; Heart failure in her father; Hepatitis in her father; Hyperlipidemia in her father and mother; Hypertension in her father and mother; Hypothyroidism in her mother; Mental illness in her father, mother, and sister; Migraines in her father, mother, and sister; Osteoarthritis in her mother; Seizures (age of onset: 16) in her sister. Social History:  reports that she has been smoking Cigarettes.  She has a 6 pack-year smoking history. She has never used smokeless tobacco. She reports that she drinks about 0.5 ounces of alcohol per week. She reports that she does not use illicit drugs.   Review of Systems  Constitutional: Negative for fever and chills.  HENT: Negative for neck pain.   Eyes: Negative for blurred vision.  Respiratory: Negative for shortness of breath.   Cardiovascular: Negative for chest pain.  Gastrointestinal: Positive for nausea and abdominal pain (ctx). Negative for vomiting.   Musculoskeletal: Negative for myalgias and back pain.  Neurological: Negative for headaches.    Dilation: 4.5 Effacement (%): 80 Station: -1 Exam by:: K.Wilson,RN Blood pressure 138/34, pulse 65, temperature 98 F (36.7 C), temperature source Oral, resp. rate 18, height 5\' 4"  (1.626 m), weight 90.266 kg (199 lb), last menstrual period 05/28/2012. Exam Physical Exam  Constitutional: She is oriented to person, place, and time. She appears well-developed and well-nourished. No distress.  HENT:  Head: Normocephalic and atraumatic.  Eyes: Conjunctivae and EOM are normal.  Neck: Neck supple.  Cardiovascular: Normal rate, regular rhythm, normal heart sounds and intact distal pulses.  Exam reveals no gallop and no friction rub.   No murmur heard. Respiratory: Effort normal and breath sounds normal. No respiratory distress. She has no wheezes. She has no rales. She exhibits no tenderness.  GI: Soft. Bowel sounds are normal. She exhibits no distension and no mass. There is no tenderness. There is no rebound and no guarding.  Musculoskeletal: Normal range of motion.  Neurological: She is alert and oriented to person, place, and time. She has normal reflexes. No cranial nerve deficit. Coordination normal.  Skin: She is not diaphoretic.    VTX by [place] FHT: 120s mod variability, +accels 15x15 prior to fentanyly, 1 variable accel,  Toco: q2-3 min  Prenatal labs: ABO, Rh: AB/POS/-- (01/07 1508) Antibody: NEG (01/07 1508) Rubella: 10.50 (01/07 1508) RPR: NON REAC (06/25 1608)  HBsAg: NEGATIVE (01/07 1508)  HIV: NON REACTIVE (06/10 1107)  GBS: Negative (08/12 0000)   Assessment/Plan: [name] is a 32 y.o. [id number] at [redacted]w[redacted]d presents in Active Labor and SROM #Labor: Expectant management at this time, if spaces out, start pit #Pain: Epidural being placed #FWB: Cat II, reactive and reassuring, non recurrent variabile #ID: GBS neg, TDap recd #MOF: Breast #MOC: IUD/Vasectomy #Circ: By  pediatrician #HTN: cont home atenolol 50mg  qday, BP currently well controlled. If severe, range collect baseline labs. Nml labs on 20Aug14    Tawana Scale 03/01/2013, 10:10 AM

## 2013-03-02 MED ORDER — PNEUMOCOCCAL VAC POLYVALENT 25 MCG/0.5ML IJ INJ
0.5000 mL | INJECTION | Freq: Once | INTRAMUSCULAR | Status: AC
  Administered 2013-03-02: 0.5 mL via INTRAMUSCULAR
  Filled 2013-03-02: qty 0.5

## 2013-03-02 MED ORDER — IBUPROFEN 600 MG PO TABS: 600.0000 mg | ORAL_TABLET | Freq: Four times a day (QID) | ORAL | Status: DC

## 2013-03-02 NOTE — Discharge Summary (Signed)
Obstetric Discharge Summary Reason for Admission: rupture of membranes Prenatal Procedures: NST Intrapartum Procedures: spontaneous vaginal delivery Postpartum Procedures: none Complications-Operative and Postpartum: none Hemoglobin  Date Value Range Status  03/01/2013 10.8* 12.0 - 15.0 g/dL Final     HCT  Date Value Range Status  03/01/2013 31.6* 36.0 - 46.0 % Final  Hospital Course: [name] is a 32 y.o. female presenting for SROM at 0600 with clear fluid. Pregnancy complicated by [facility] and normal monitoring. Normal Fetal movement, +CTX, +Spotting. Delivery Note  At 11:59 AM a viable female was delivered via Vaginal, Spontaneous Delivery (Presentation: Middle Occiput Anterior). APGAR: crying at perineum ; weight .  Placenta status: Intact, Spontaneous. Cord: with the following complications: None.  Anesthesia: Epidural  Episiotomy: none  Lacerations: none  Suture Repair: na  Est. Blood Loss (mL): 250  Mom to postpartum. Baby to nursery-stable.  NSVD over intact perineum. Viable infant. Active managmeent of 3rd stage pit and traction. Delivered intact placenta and 3v cord. Hemostatic after delivery of placenta. EBL 250cc.  Tawana Scale  03/01/2013, 12:07 PM  Doing well postpartum. Wants to go home early   Physical Exam:  General: alert and no distress Lochia: appropriate Uterine Fundus: firm Incision: healing well DVT Evaluation: No evidence of DVT seen on physical exam.  Discharge Diagnoses: Term Pregnancy-delivered  Discharge Information: Date: 03/02/2013 Activity: pelvic rest Diet: routine Medications: PNV and Ibuprofen Condition: stable and improved Instructions: refer to practice specific booklet Discharge to: home   Newborn Data: Live born female  Birth Weight: 7 lb 2.3 oz (3240 g) APGAR: 8, 9  Home with mother.  [facility] 03/02/2013, 11:23 AM

## 2013-03-02 NOTE — Progress Notes (Signed)
UR chart review completed.  

## 2013-03-02 NOTE — Lactation Note (Signed)
This note was copied from the chart of Shannon Infant Doane. Lactation Consultation Note: Follow up visit with mom. She reports that the baby has just nursed for most of the last hour due to PKU and bili being drawn. Reports that he is nursing well. Mom reports engorgement with last baby and she quit nursing after a few days. Reviewed engorgement prevention and treatment. Has Medela DEBP this time- borrowed from her cousin. Suggested checking the suction since it is an older model- she does not have it here with her. Can call and make appointment for [place] to check it in our office. No questions at present. To call prn. Reviewed OP appointments as resource for support after DC.  Patient Name: [name] [id number] Date: 03/02/2013 Reason for consult: Follow-up assessment   Maternal Data    Feeding   LATCH Score/Interventions Latch: Grasps breast easily, tongue down, lips flanged, rhythmical sucking.  Audible Swallowing: A few with stimulation Intervention(s): Skin to skin  Type of Nipple: Everted at rest and after stimulation  Comfort (Breast/Nipple): Soft / non-tender     Hold (Positioning): No assistance needed to correctly position infant at breast. Intervention(s): Support Pillows;Breastfeeding basics reviewed;Position options;Skin to skin  LATCH Score: 9  Lactation Tools Discussed/Used     Consult Status Consult Status: Complete    Pamelia Hoit 03/02/2013, 2:09 PM

## 2013-03-02 NOTE — Discharge Summary (Signed)
Attestation of Attending Supervision of Advanced Practitioner (CNM/NP): Evaluation and management procedures were performed by the Advanced Practitioner under my supervision and collaboration.  I have reviewed the Advanced Practitioner's note and chart, and I agree with the management and plan.  Crimson Dubberly 03/02/2013 12:00 PM

## 2013-03-02 NOTE — Anesthesia Postprocedure Evaluation (Signed)
  Anesthesia Post-op Note  Patient: [name]  Procedure(s) Performed: * No procedures listed *  Patient Location: PACU and Mother/Baby  Anesthesia Type:Spinal  Level of Consciousness: awake, alert  and oriented  Airway and Oxygen Therapy: Patient Spontanous Breathing  Post-op Pain: mild  Post-op Assessment: Patient's Cardiovascular Status Stable, Respiratory Function Stable, No signs of Nausea or vomiting, Adequate PO intake, Pain level controlled, No headache, No backache, No residual numbness and No residual motor weakness  Post-op Vital Signs: stable  Complications: No apparent anesthesia complications

## 2013-03-04 ENCOUNTER — Inpatient Hospital Stay (HOSPITAL_COMMUNITY): Admission: RE | Admit: 2013-03-04 | Payer: Medicaid Other | Source: Ambulatory Visit

## 2013-03-19 ENCOUNTER — Emergency Department: Payer: Self-pay | Admitting: Emergency Medicine

## 2013-03-19 IMAGING — CR RIGHT FIFTH TOE
1 series · 3 of 3 positions shown · non-contrast
Comparison: none

REASON FOR EXAM: pain/injury
COMMENTS:

[Series 1: ap · 0.17mm/px · 3 of 3 slices shown]
[im 1/3]
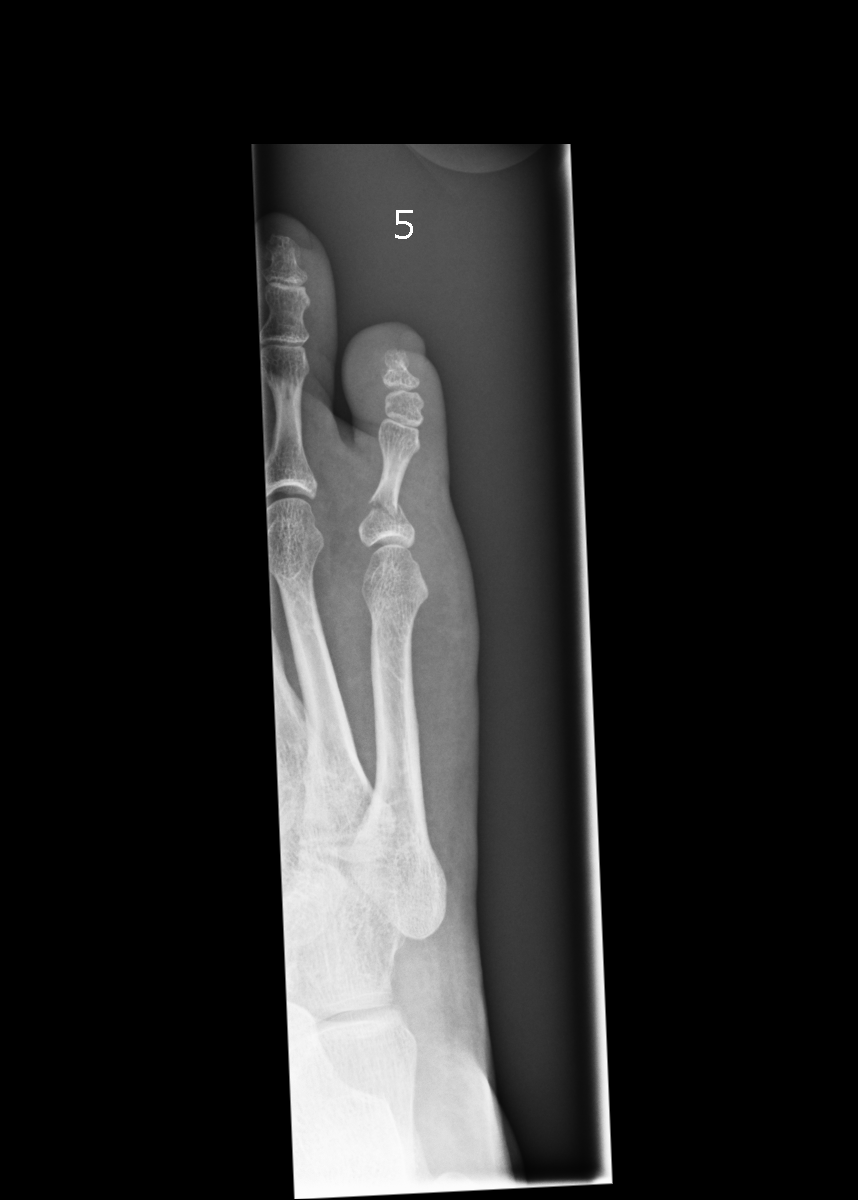
[im 2/3]
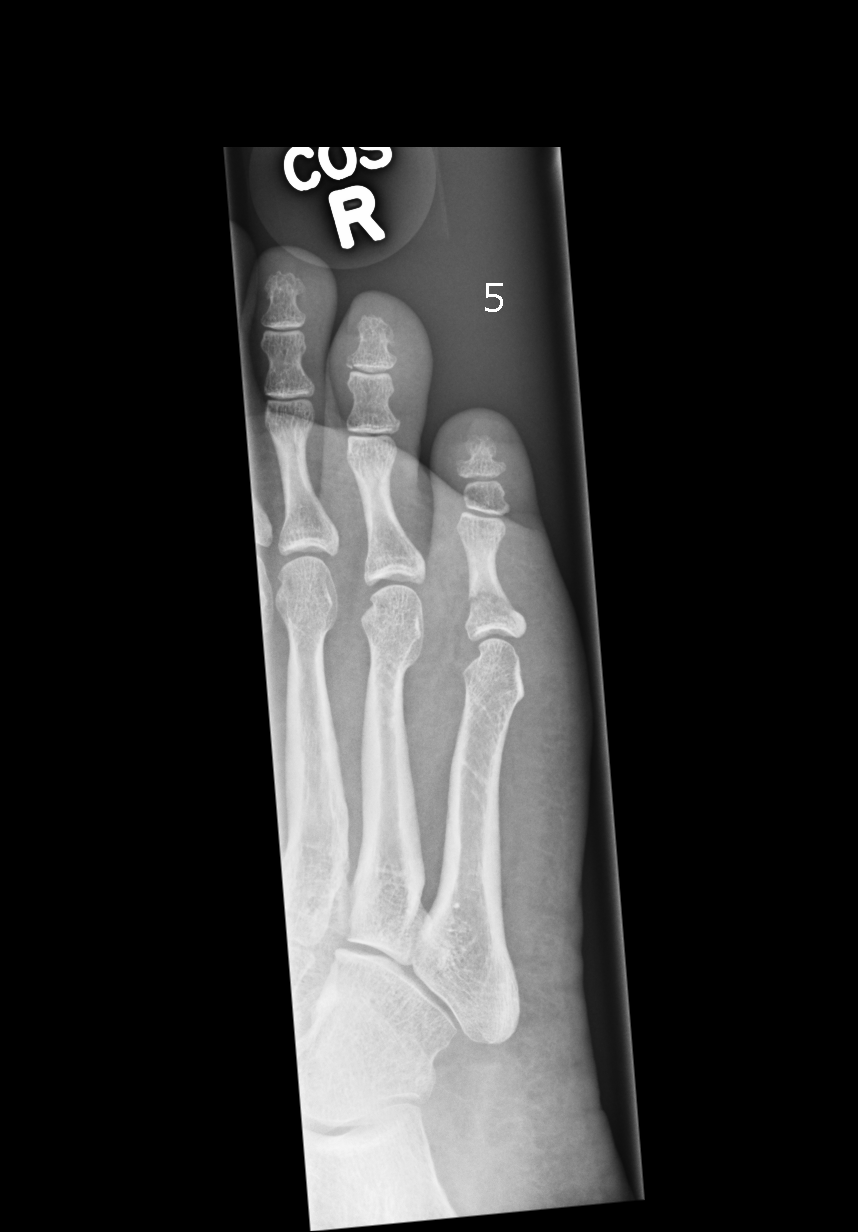
[im 3/3]
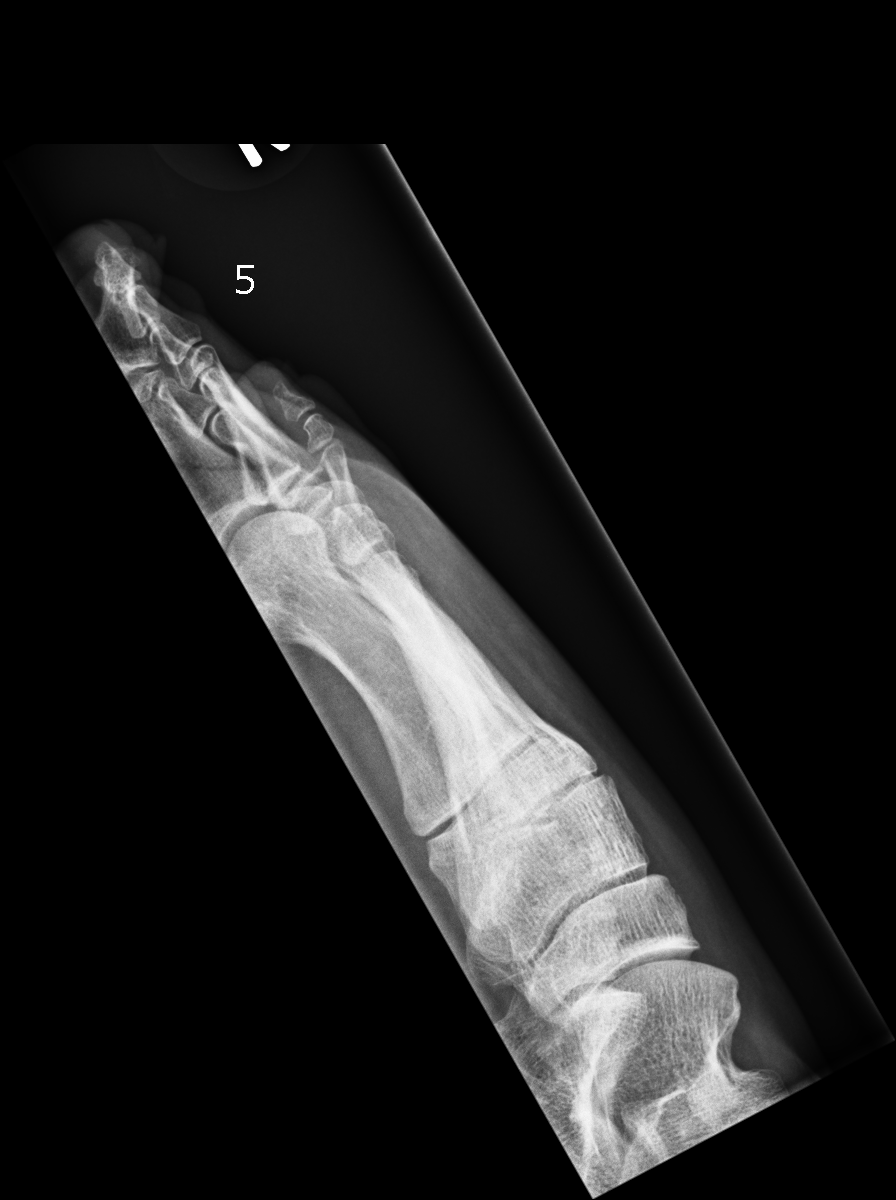

[3 of 3 positions shown; findings below may reference images not displayed]

PROCEDURE:     DXR - DXR TOE 5TH DIGIT RIGHT FOOT  - [DATE] [DATE]

RESULT:     Three views of the right fifth toe reveal a complete fracture
through the base of the proximal phalanx. There is angulation at the
fracture site. The fifth metatarsal appears intact. There is no involvement
of the fifth metatarsophalangeal joint. The middle and distal phalanges of
the fifth toe are intact.
IMPRESSION: The patient has sustained an acute mildly angulated
fracture through the base of the proximal phalanx of the right fifth toe.

[REDACTED]

## 2013-04-08 ENCOUNTER — Encounter: Payer: Medicaid Other | Admitting: Family Medicine

## 2013-04-08 ENCOUNTER — Ambulatory Visit (INDEPENDENT_AMBULATORY_CARE_PROVIDER_SITE_OTHER): Payer: Medicaid Other | Admitting: Family Medicine

## 2013-04-08 ENCOUNTER — Encounter: Payer: Self-pay | Admitting: Family Medicine

## 2013-04-08 NOTE — Progress Notes (Signed)
  Subjective:     [name] is a 32 y.o. female who presents for a postpartum visit. She is 6 weeks postpartum following a spontaneous vaginal delivery. I have fully reviewed the prenatal and intrapartum course. The delivery was at 39 gestational weeks. Outcome: spontaneous vaginal delivery. Anesthesia: epidural. Postpartum course has been normal. Baby's course has been unremarkable. Baby is feeding by bottle - gerber soothe. Bleeding no bleeding. Bowel function is normal. Bladder function is normal. Patient is not sexually active. Contraception method is none. Postpartum depression screening: negative.  The following portions of the patient's history were reviewed and updated as appropriate: allergies, current medications, past family history, past medical history, past social history, past surgical history and problem list.  Review of Systems Pertinent items are noted in HPI.   Objective:    BP 129/98  Pulse 65  Ht 5\' 4"  (1.626 m)  Wt 183 lb (83.008 kg)  BMI 31.4 kg/m2  Breastfeeding? No  General:  alert, cooperative and appears stated age  Abdomen: soft, non-tender; bowel sounds normal; no masses,  no organomegaly   Vulva:  normal  Vagina: normal vagina  Cervix:  multiparous appearance and no lesions  Corpus: normal size, contour, position, consistency, mobility, non-tender  Adnexa:  normal adnexa        Assessment:     Normal postpartum exam. Pap smear not done at today's visit.   Plan:    1. Contraception: vasectomy 2. Pap due 07/2017 3. Follow up in: 1 year or as needed.

## 2013-04-08 NOTE — Patient Instructions (Signed)

## 2013-04-09 NOTE — Progress Notes (Signed)
This encounter was created in error - please disregard.

## 2013-04-16 ENCOUNTER — Encounter: Payer: Self-pay | Admitting: Obstetrics and Gynecology

## 2013-04-16 ENCOUNTER — Ambulatory Visit: Payer: Medicaid Other | Admitting: Obstetrics and Gynecology

## 2013-04-16 NOTE — Progress Notes (Signed)
IUD not available for insertion, patient will return next week for Mirena IUD insertion.

## 2013-04-23 ENCOUNTER — Ambulatory Visit (INDEPENDENT_AMBULATORY_CARE_PROVIDER_SITE_OTHER): Payer: Self-pay | Admitting: Obstetrics and Gynecology

## 2013-04-23 ENCOUNTER — Encounter: Payer: Self-pay | Admitting: Obstetrics and Gynecology

## 2013-04-23 VITALS — BP 138/92 | HR 67 | Resp 16 | Ht 62.0 in | Wt 179.0 lb

## 2013-04-23 DIAGNOSIS — Z30430 Encounter for insertion of intrauterine contraceptive device: Secondary | ICD-10-CM

## 2013-04-23 DIAGNOSIS — Z01812 Encounter for preprocedural laboratory examination: Secondary | ICD-10-CM

## 2013-04-23 DIAGNOSIS — Z23 Encounter for immunization: Secondary | ICD-10-CM

## 2013-04-23 LAB — POCT URINE PREGNANCY: Preg Test, Ur: NEGATIVE

## 2013-04-23 MED ORDER — LEVONORGESTREL 20 MCG/24HR IU IUD
1.0000 | INTRAUTERINE_SYSTEM | Freq: Once | INTRAUTERINE | Status: AC
  Administered 2013-04-23: 1 via INTRAUTERINE

## 2013-04-23 NOTE — Progress Notes (Signed)
Patient ID: [name], female   DOB: 11-04-1980, 32 y.o.   MRN: [id number] 32 yo here for IUD insertion. Patient has had the IUD twice before without complications  IUD Procedure Note Patient identified, informed consent performed, signed copy in chart, time out was performed.  Urine pregnancy test negative.  Speculum placed in the vagina.  Cervix visualized.  Cleaned with Betadine x 2.  Grasped anteriorly with a single tooth tenaculum.  Uterus sounded to 8 cm.  Mirena IUD placed per manufacturer's recommendations.  Strings trimmed to 3 cm. Tenaculum was removed, good hemostasis noted.  Patient tolerated procedure well.   Patient given post procedure instructions and Mirena care card with expiration date.  Patient is asked to check IUD strings periodically and follow up in 4-6 weeks for IUD check.

## 2013-04-23 NOTE — Addendum Note (Signed)
Addended by: Arne Cleveland on: 04/23/2013 11:32 AM   Modules accepted: Orders

## 2013-05-21 ENCOUNTER — Other Ambulatory Visit: Payer: Self-pay | Admitting: Family Medicine

## 2013-06-19 ENCOUNTER — Ambulatory Visit (INDEPENDENT_AMBULATORY_CARE_PROVIDER_SITE_OTHER): Payer: 59 | Admitting: Family Medicine

## 2013-06-19 ENCOUNTER — Encounter: Payer: Self-pay | Admitting: Family Medicine

## 2013-06-19 VITALS — BP 141/96 | HR 69 | Ht 64.0 in | Wt 184.0 lb

## 2013-06-19 DIAGNOSIS — I10 Essential (primary) hypertension: Secondary | ICD-10-CM

## 2013-06-19 DIAGNOSIS — Z30431 Encounter for routine checking of intrauterine contraceptive device: Secondary | ICD-10-CM | POA: Insufficient documentation

## 2013-06-19 MED ORDER — HYDROCHLOROTHIAZIDE 25 MG PO TABS: 25.0000 mg | ORAL_TABLET | Freq: Every day | ORAL | Status: DC

## 2013-06-19 NOTE — Patient Instructions (Signed)
Levonorgestrel intrauterine device (IUD) What is this medicine? LEVONORGESTREL IUD (LEE voe nor jes trel) is a contraceptive (birth control) device. The device is placed inside the uterus by a healthcare professional. It is used to prevent pregnancy and can also be used to treat heavy bleeding that occurs during your period. Depending on the device, it can be used for 3 to 5 years. This medicine may be used for other purposes; ask your health care provider or pharmacist if you have questions. COMMON BRAND NAME(S): Mirena, Skyla What should I tell my health care provider before I take this medicine? They need to know if you have any of these conditions: -abnormal Pap smear -cancer of the breast, uterus, or cervix -diabetes -endometritis -genital or pelvic infection now or in the past -have more than one sexual partner or your partner has more than one partner -heart disease -history of an ectopic or tubal pregnancy -immune system problems -IUD in place -liver disease or tumor -problems with blood clots or take blood-thinners -use intravenous drugs -uterus of unusual shape -vaginal bleeding that has not been explained -an unusual or allergic reaction to levonorgestrel, other hormones, silicone, or polyethylene, medicines, foods, dyes, or preservatives -pregnant or trying to get pregnant -breast-feeding How should I use this medicine? This device is placed inside the uterus by a health care professional. Talk to your pediatrician regarding the use of this medicine in children. Special care may be needed. Overdosage: If you think you have taken too much of this medicine contact a poison control center or emergency room at once. NOTE: This medicine is only for you. Do not share this medicine with others. What if I miss a dose? This does not apply. What may interact with this medicine? Do not take this medicine with any of the following  medications: -amprenavir -bosentan -fosamprenavir This medicine may also interact with the following medications: -aprepitant -barbiturate medicines for inducing sleep or treating seizures -bexarotene -griseofulvin -medicines to treat seizures like carbamazepine, ethotoin, felbamate, oxcarbazepine, phenytoin, topiramate -modafinil -pioglitazone -rifabutin -rifampin -rifapentine -some medicines to treat HIV infection like atazanavir, indinavir, lopinavir, nelfinavir, tipranavir, ritonavir -St. John's wort -warfarin This list may not describe all possible interactions. Give your health care provider a list of all the medicines, herbs, non-prescription drugs, or dietary supplements you use. Also tell them if you smoke, drink alcohol, or use illegal drugs. Some items may interact with your medicine. What should I watch for while using this medicine? Visit your doctor or health care professional for regular check ups. See your doctor if you or your partner has sexual contact with others, becomes HIV positive, or gets a sexual transmitted disease. This product does not protect you against HIV infection (AIDS) or other sexually transmitted diseases. You can check the placement of the IUD yourself by reaching up to the top of your vagina with clean fingers to feel the threads. Do not pull on the threads. It is a good habit to check placement after each menstrual period. Call your doctor right away if you feel more of the IUD than just the threads or if you cannot feel the threads at all. The IUD may come out by itself. You may become pregnant if the device comes out. If you notice that the IUD has come out use a backup birth control method like condoms and call your health care provider. Using tampons will not change the position of the IUD and are okay to use during your period. What side effects may I   notice from receiving this medicine? Side effects that you should report to your doctor or  health care professional as soon as possible: -allergic reactions like skin rash, itching or hives, swelling of the face, lips, or tongue -fever, flu-like symptoms -genital sores -high blood pressure -no menstrual period for 6 weeks during use -pain, swelling, warmth in the leg -pelvic pain or tenderness -severe or sudden headache -signs of pregnancy -stomach cramping -sudden shortness of breath -trouble with balance, talking, or walking -unusual vaginal bleeding, discharge -yellowing of the eyes or skin Side effects that usually do not require medical attention (report to your doctor or health care professional if they continue or are bothersome): -acne -breast pain -change in sex drive or performance -changes in weight -cramping, dizziness, or faintness while the device is being inserted -headache -irregular menstrual bleeding within first 3 to 6 months of use -nausea This list may not describe all possible side effects. Call your doctor for medical advice about side effects. You may report side effects to FDA at 1-800-FDA-1088. Where should I keep my medicine? This does not apply. NOTE: This sheet is a summary. It may not cover all possible information. If you have questions about this medicine, talk to your doctor, pharmacist, or health care provider.  2014, Elsevier/Gold Standard. (2011-07-19 13:54:04) Hypertension As your heart beats, it forces blood through your arteries. This force is your blood pressure. If the pressure is too high, it is called hypertension (HTN) or high blood pressure. HTN is dangerous because you may have it and not know it. High blood pressure may mean that your heart has to work harder to pump blood. Your arteries may be narrow or stiff. The extra work puts you at risk for heart disease, stroke, and other problems.  Blood pressure consists of two numbers, a higher number over a lower, 110/72, for example. It is stated as "110 over 72." The ideal is below  120 for the top number (systolic) and under 80 for the bottom (diastolic). Write down your blood pressure today. You should pay close attention to your blood pressure if you have certain conditions such as:  Heart failure.  Prior heart attack.  Diabetes  Chronic kidney disease.  Prior stroke.  Multiple risk factors for heart disease. To see if you have HTN, your blood pressure should be measured while you are seated with your arm held at the level of the heart. It should be measured at least twice. A one-time elevated blood pressure reading (especially in the Emergency Department) does not mean that you need treatment. There may be conditions in which the blood pressure is different between your right and left arms. It is important to see your caregiver soon for a recheck. Most people have essential hypertension which means that there is not a specific cause. This type of high blood pressure may be lowered by changing lifestyle factors such as:  Stress.  Smoking.  Lack of exercise.  Excessive weight.  Drug/tobacco/alcohol use.  Eating less salt. Most people do not have symptoms from high blood pressure until it has caused damage to the body. Effective treatment can often prevent, delay or reduce that damage. TREATMENT  When a cause has been identified, treatment for high blood pressure is directed at the cause. There are a large number of medications to treat HTN. These fall into several categories, and your caregiver will help you select the medicines that are best for you. Medications may have side effects. You should review side effects  with your caregiver. If your blood pressure stays high after you have made lifestyle changes or started on medicines,   Your medication(s) may need to be changed.  Other problems may need to be addressed.  Be certain you understand your prescriptions, and know how and when to take your medicine.  Be sure to follow up with your caregiver  within the time frame advised (usually within two weeks) to have your blood pressure rechecked and to review your medications.  If you are taking more than one medicine to lower your blood pressure, make sure you know how and at what times they should be taken. Taking two medicines at the same time can result in blood pressure that is too low. SEEK IMMEDIATE MEDICAL CARE IF:  You develop a severe headache, blurred or changing vision, or confusion.  You have unusual weakness or numbness, or a faint feeling.  You have severe chest or abdominal pain, vomiting, or breathing problems. MAKE SURE YOU:   Understand these instructions.  Will watch your condition.  Will get help right away if you are not doing well or get worse. Document Released: 06/18/2005 Document Revised: 09/10/2011 Document Reviewed: 02/06/2008 [facility] Patient Information 2014 [place], [place].

## 2013-06-19 NOTE — Progress Notes (Signed)
   Subjective:    Patient ID: [name], female    DOB: 05/25/1981, 32 y.o.   MRN: [id number]  HPI  Had bleeding heavy with pain like when she lost her last IUD.  Wants to be sure it is still where it is supposed to be.  Review of Systems  Constitutional: Negative for fever and chills.  Respiratory: Negative for shortness of breath.   Gastrointestinal: Negative for abdominal pain.       Objective:   Physical Exam  Vitals reviewed. Constitutional: She appears well-developed and well-nourished. No distress.  HENT:  Head: Normocephalic and atraumatic.  Cardiovascular: Normal rate.   Pulmonary/Chest: Effort normal.  Abdominal: Soft. There is no tenderness.  Genitourinary: Vagina normal.  IUD strings visualized.  Skin: Skin is warm and dry.          Assessment & Plan:

## 2013-06-19 NOTE — Assessment & Plan Note (Signed)
In normal position.

## 2013-06-19 NOTE — Progress Notes (Signed)
IUD check, patient had a heavy period at the end of November with a stabbing pain in her vagina.  She just wants to be sure that her IUD is still in place as this is how she got pregnant this past time.

## 2013-07-17 ENCOUNTER — Encounter: Payer: Self-pay | Admitting: Family Medicine

## 2013-07-17 ENCOUNTER — Ambulatory Visit (INDEPENDENT_AMBULATORY_CARE_PROVIDER_SITE_OTHER): Payer: 59 | Admitting: Family Medicine

## 2013-07-17 VITALS — BP 117/83 | HR 61 | Ht 64.0 in | Wt 187.0 lb

## 2013-07-17 DIAGNOSIS — Z124 Encounter for screening for malignant neoplasm of cervix: Secondary | ICD-10-CM

## 2013-07-17 DIAGNOSIS — Z01419 Encounter for gynecological examination (general) (routine) without abnormal findings: Secondary | ICD-10-CM

## 2013-07-17 NOTE — Patient Instructions (Signed)

## 2013-07-17 NOTE — Progress Notes (Signed)
  Subjective:     [name] is a 33 y.o. female and is here for a comprehensive physical exam. The patient reports problems - bleeding heavier with IUD over last 2 months.. Pap normal in 1/14.  History   Social History  . Marital Status: Married    Spouse Name: N/A    Number of Children: N/A  . Years of Education: N/A   Occupational History  . Not on file.   Social History Main Topics  . Smoking status: Current Some Day Smoker -- 0.50 packs/day for 12 years    Types: Cigarettes  . Smokeless tobacco: Never Used  . Alcohol Use: 0.5 oz/week    1 drink(s) per week  . Drug Use: No  . Sexual Activity: Yes   Other Topics Concern  . Not on file   Social History Narrative  . No narrative on file   Health Maintenance  Topic Date Due  . Influenza Vaccine  01/30/2014  . Pap Smear  07/09/2015  . Tetanus/tdap  12/10/2022    The following portions of the patient's history were reviewed and updated as appropriate: allergies, current medications, past family history, past medical history, past social history, past surgical history and problem list.  Review of Systems Pertinent items are noted in HPI.   Objective:    BP 117/83  Pulse 61  Ht 5\' 4"  (1.626 m)  Wt 187 lb (84.823 kg)  BMI 32.08 kg/m2  LMP 07/11/2013  Breastfeeding? No General appearance: alert, cooperative and appears stated age Head: Normocephalic, without obvious abnormality, atraumatic Neck: no adenopathy, supple, symmetrical, trachea midline and thyroid not enlarged, symmetric, no tenderness/mass/nodules Lungs: clear to auscultation bilaterally Breasts: normal appearance, no masses or tenderness Heart: regular rate and rhythm, S1, S2 normal, no murmur, click, rub or gallop Abdomen: soft, non-tender; bowel sounds normal; no masses,  no organomegaly Pelvic: cervix normal in appearance, external genitalia normal, no adnexal masses or tenderness, no cervical motion tenderness, uterus normal size, shape, and  consistency, vagina normal without discharge and IUD strings visualized. Extremities: extremities normal, atraumatic, no cyanosis or edema Pulses: 2+ and symmetric Skin: Skin color, texture, turgor normal. No rashes or lesions Lymph nodes: Cervical, supraclavicular, and axillary nodes normal. Neurologic: Grossly normal    Assessment:    Healthy female exam. Does not need Pap this year.     Plan:    Continue IUD BP meds--well controlled today. See After Visit Summary for Counseling Recommendations

## 2013-09-14 ENCOUNTER — Other Ambulatory Visit: Payer: Self-pay | Admitting: Family Medicine

## 2013-10-05 ENCOUNTER — Emergency Department: Payer: Self-pay | Admitting: Emergency Medicine

## 2013-10-05 IMAGING — CR DG CHEST 2V
1 series · 2 of 2 positions shown · non-contrast
Comparison: DG CHEST 2V dated [DATE]

CLINICAL DATA: Cough. Upper back pain. Patient tested positive for
influenza 1 week ago.

EXAM:
CHEST  2 VIEW

[Series 1: w chest pa · 0.14mm/px · 2 of 2 slices shown]
[im 1/2]
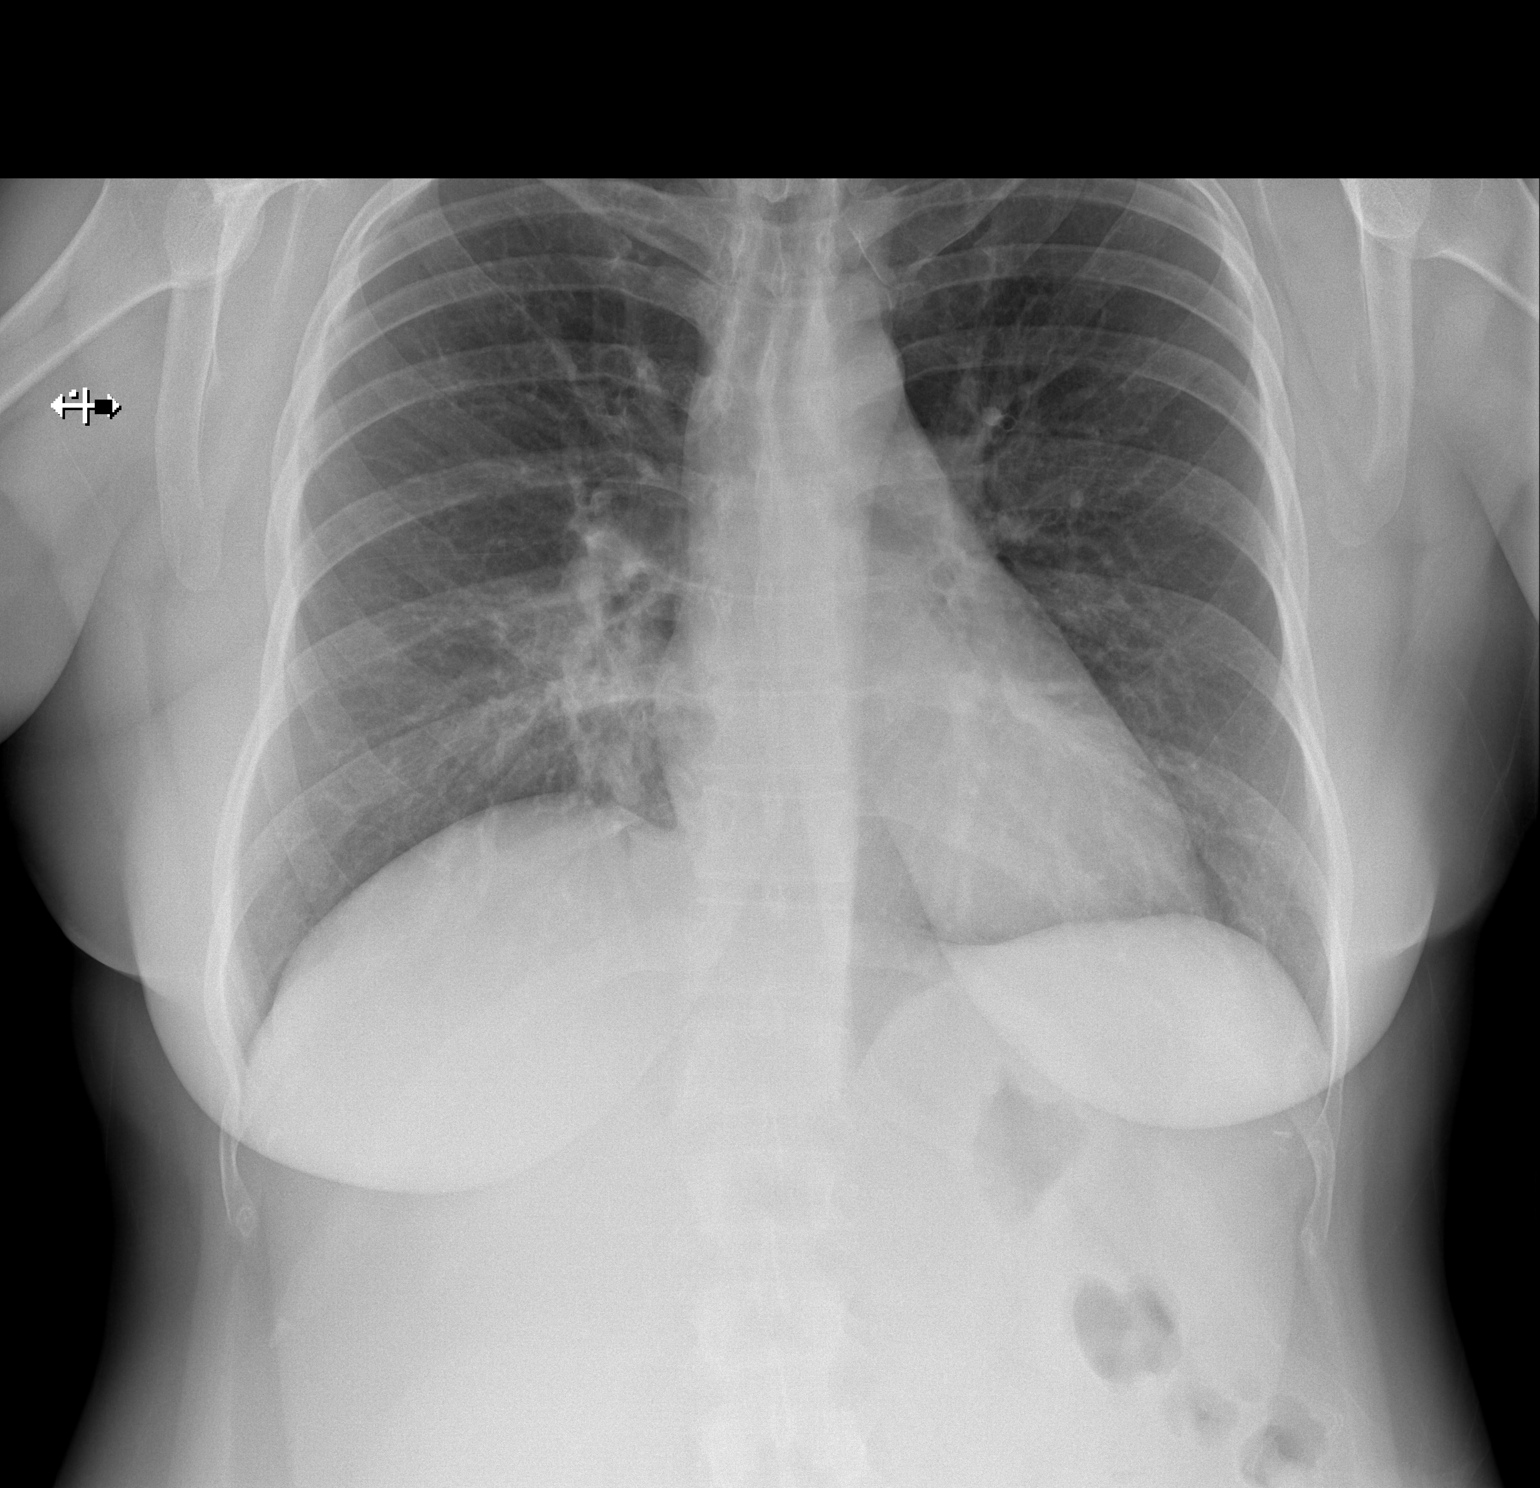
[im 2/2]
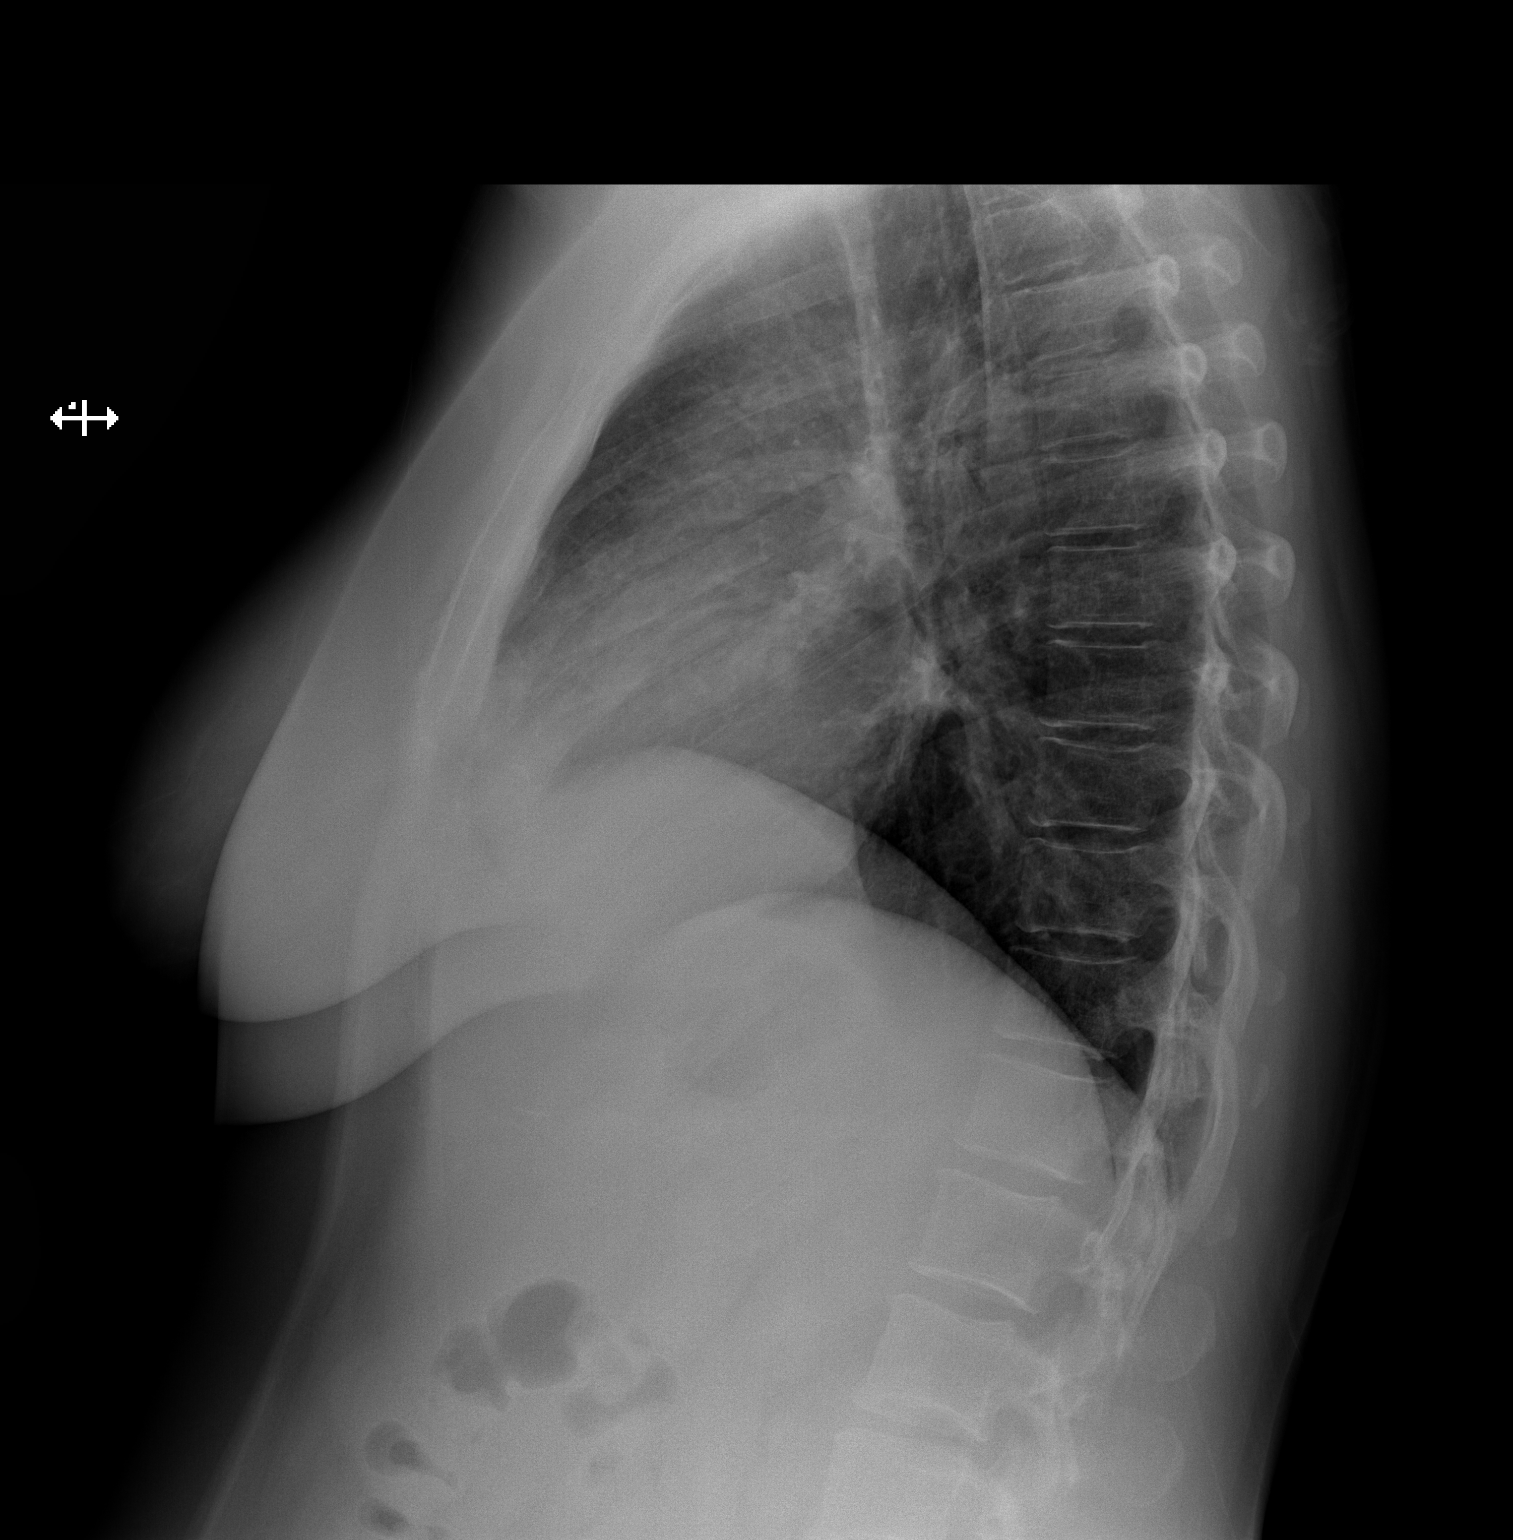

[2 of 2 positions shown; findings below may reference images not displayed]

FINDINGS: Airspace consolidation in the in the right middle lobe. Lungs
otherwise clear. Bronchovascular markings normal. Pulmonary
vascularity normal. No pneumothorax. No pleural effusions.
Cardiomediastinal silhouette unremarkable and unchanged.
IMPRESSION: Right middle lobe pneumonia.

## 2013-10-19 ENCOUNTER — Other Ambulatory Visit: Payer: Self-pay | Admitting: Family Medicine

## 2013-11-18 ENCOUNTER — Other Ambulatory Visit: Payer: Self-pay | Admitting: Family Medicine

## 2013-12-25 ENCOUNTER — Observation Stay: Payer: Self-pay | Admitting: Surgery

## 2013-12-25 LAB — URINALYSIS, COMPLETE
BILIRUBIN, UR: NEGATIVE
BLOOD: NEGATIVE
Bacteria: NONE SEEN
GLUCOSE, UR: NEGATIVE mg/dL (ref 0–75)
KETONE: NEGATIVE
LEUKOCYTE ESTERASE: NEGATIVE
NITRITE: NEGATIVE
Ph: 5 (ref 4.5–8.0)
Protein: NEGATIVE
SPECIFIC GRAVITY: 1.019 (ref 1.003–1.030)
Squamous Epithelial: 2

## 2013-12-25 LAB — COMPREHENSIVE METABOLIC PANEL
ALBUMIN: 4.1 g/dL (ref 3.4–5.0)
ANION GAP: 7 (ref 7–16)
AST: 22 U/L (ref 15–37)
Alkaline Phosphatase: 72 U/L
BUN: 9 mg/dL (ref 7–18)
Bilirubin,Total: 0.3 mg/dL (ref 0.2–1.0)
CHLORIDE: 105 mmol/L (ref 98–107)
CO2: 27 mmol/L (ref 21–32)
Calcium, Total: 9.6 mg/dL (ref 8.5–10.1)
Creatinine: 0.67 mg/dL (ref 0.60–1.30)
GLUCOSE: 103 mg/dL — AB (ref 65–99)
OSMOLALITY: 276 (ref 275–301)
POTASSIUM: 3.6 mmol/L (ref 3.5–5.1)
SGPT (ALT): 44 U/L (ref 12–78)
Sodium: 139 mmol/L (ref 136–145)
Total Protein: 7.5 g/dL (ref 6.4–8.2)

## 2013-12-25 LAB — CBC
HCT: 39.9 % (ref 35.0–47.0)
HGB: 13.8 g/dL (ref 12.0–16.0)
MCH: 31.5 pg (ref 26.0–34.0)
MCHC: 34.5 g/dL (ref 32.0–36.0)
MCV: 91 fL (ref 80–100)
Platelet: 263 10*3/uL (ref 150–440)
RBC: 4.36 10*6/uL (ref 3.80–5.20)
RDW: 13.5 % (ref 11.5–14.5)
WBC: 8.1 10*3/uL (ref 3.6–11.0)

## 2013-12-25 LAB — HCG, QUANTITATIVE, PREGNANCY

## 2013-12-25 LAB — LIPASE, BLOOD: LIPASE: 216 U/L (ref 73–393)

## 2013-12-25 IMAGING — US ABDOMEN ULTRASOUND LIMITED
1 series · 14 of 25 positions shown · non-contrast
Comparison: None.

CLINICAL DATA: 32-year-old female with right upper quadrant pain
and nausea. Back pain. Initial encounter.

EXAM:
US ABDOMEN LIMITED - RIGHT UPPER QUADRANT

[Series 1: abdomen ultrasound limited · 0.29mm/px · 14 of 41 slices shown]
[im 1/41]
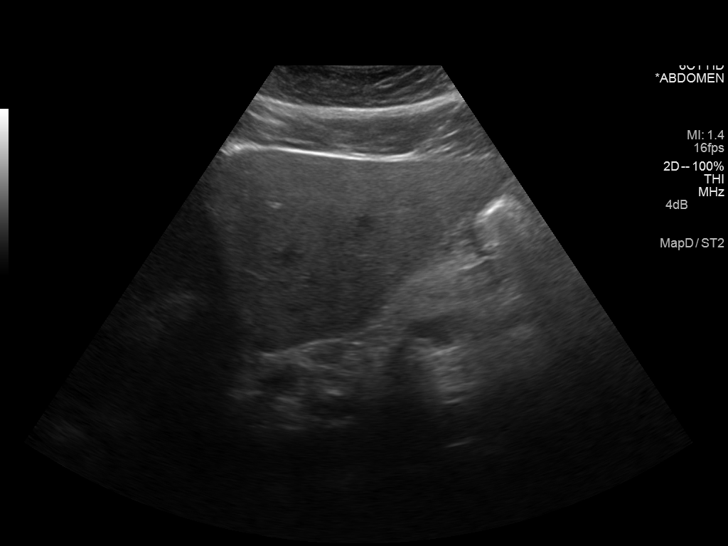
[im 4/41]
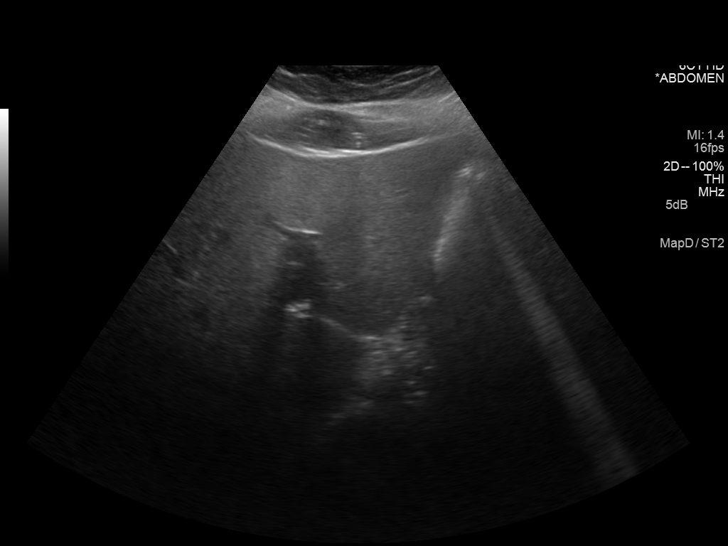
[im 7/41]
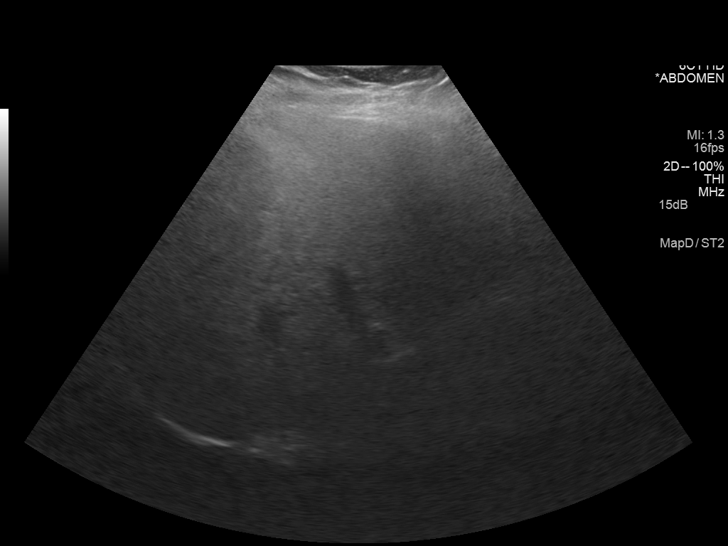
[im 11/41]
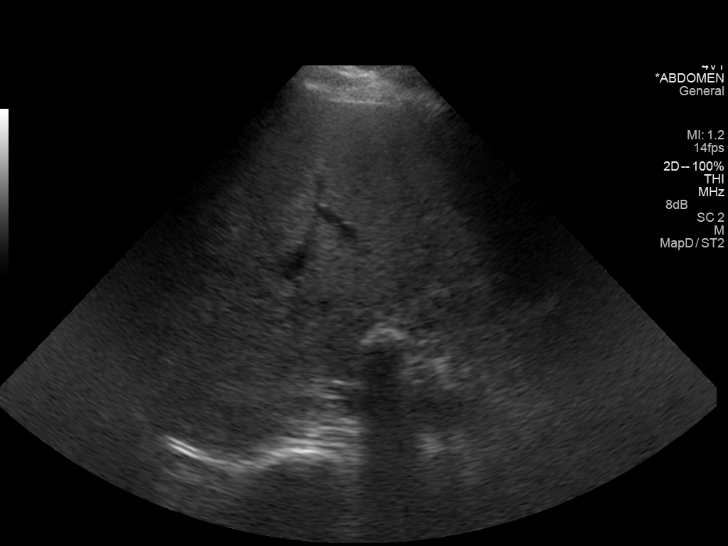
[im 14/41]
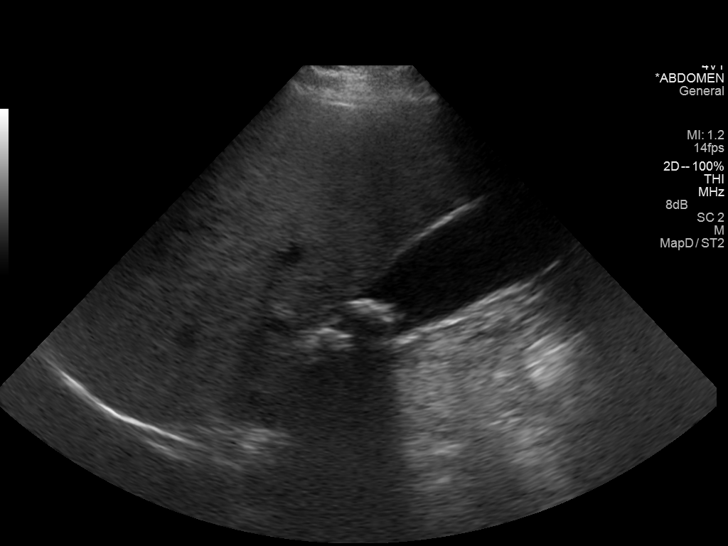
[im 16/41]
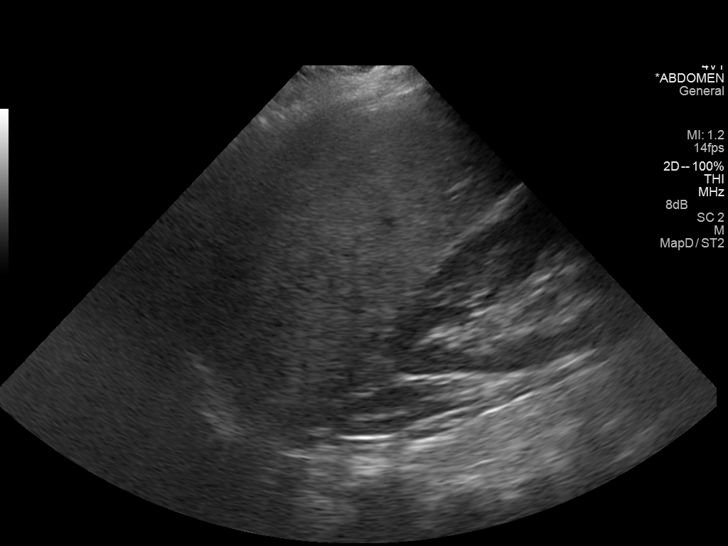
[im 19/41]
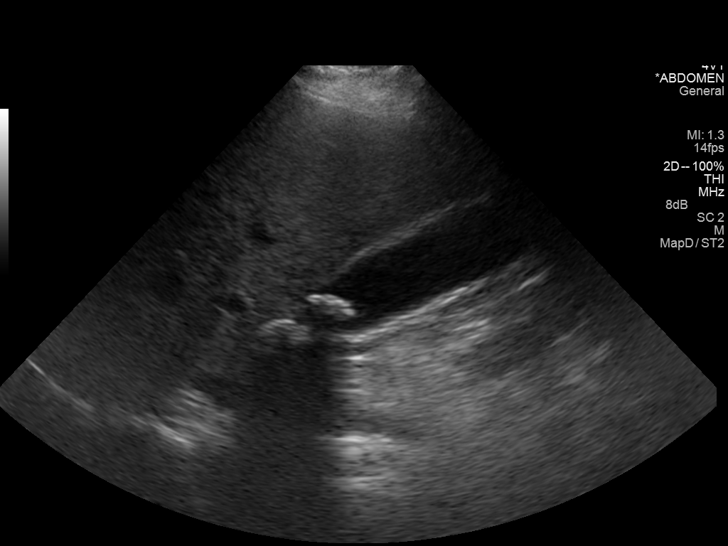
[im 22/41]
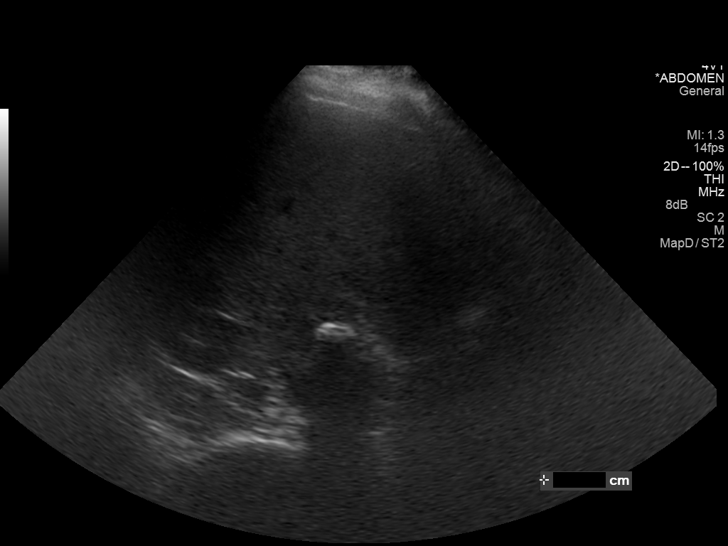
[im 26/41]
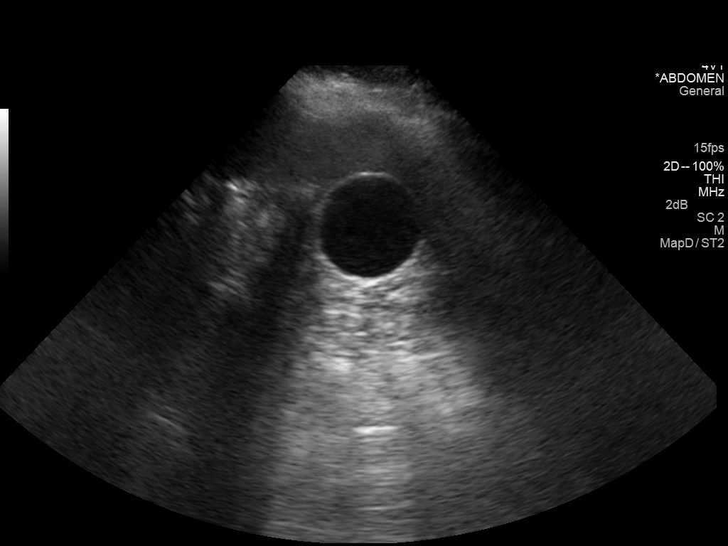
[im 27/41]
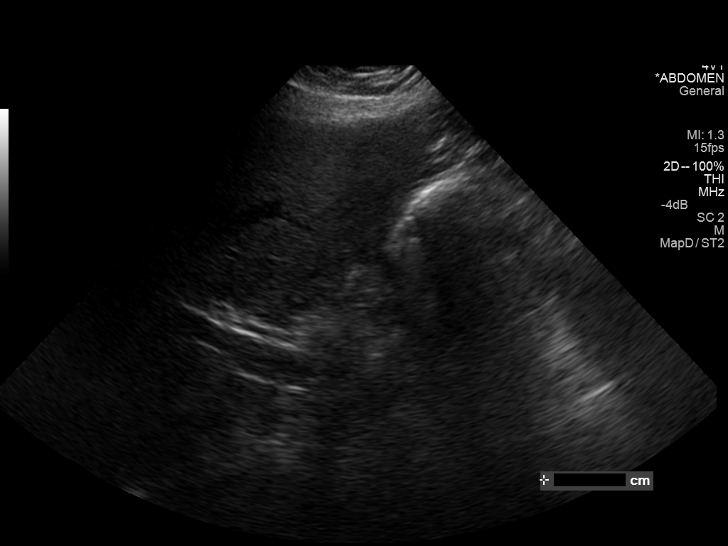
[im 31/41]
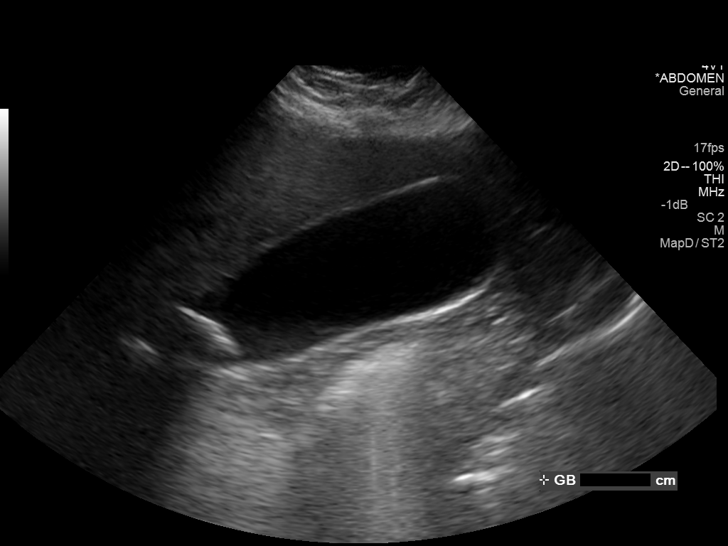
[im 34/41]
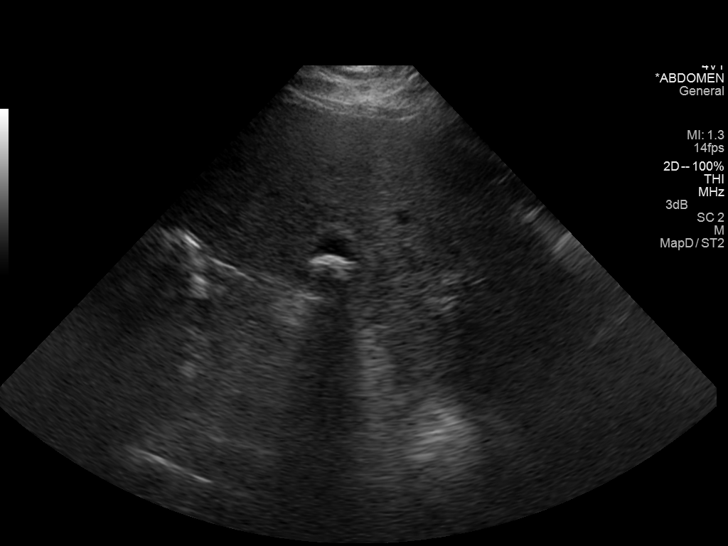
[im 37/41]
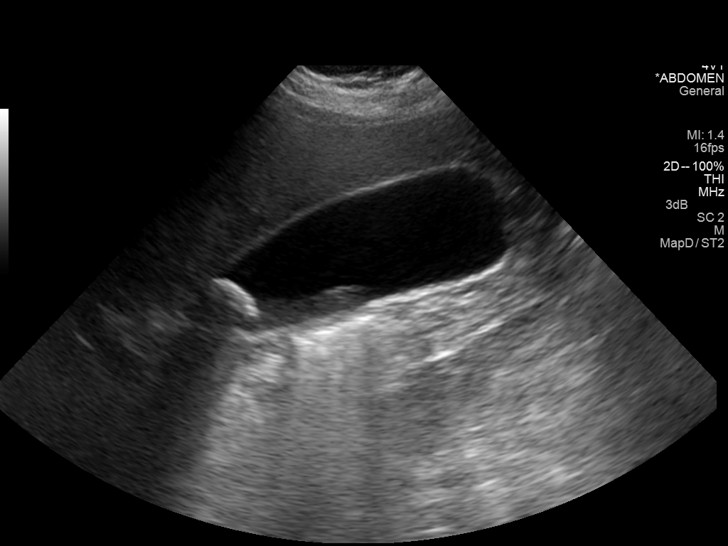
[im 41/41]
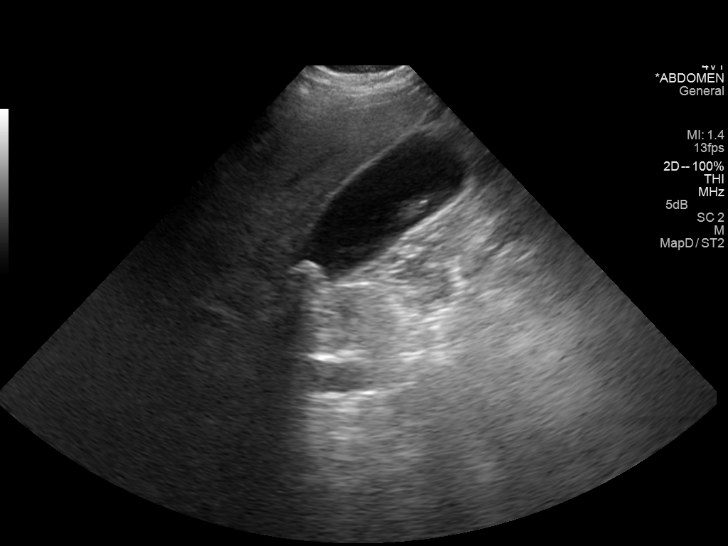

[14 of 25 positions shown; findings below may reference images not displayed]

FINDINGS: Gallbladder:

Multiple stones. 1.4 cm nonmobile stone at the gallbladder neck
(image 23). Largest stone 1.7 cm diameter. Positive sonographic
Murphy sign. Wall thickness remains normal. No pericholecystic
fluid. The gallbladder is mildly distended.

Common bile duct:

Diameter: 3 mm, normal

Liver:

Diffusely echogenic and difficult penetrate (image 19). No
intrahepatic ductal dilatation. No focal liver lesion.

Other findings:   Negative visible right kidney.
IMPRESSION: 1. Positive for acute cholecystitis with gallstone lodged in the
gallbladder neck and sonographic Murphy's sign elicited. Multiple
stones up to 1.7 cm diameter.
2. No evidence of biliary obstruction.
3. Hepatic steatosis.

## 2013-12-26 HISTORY — PX: CHOLECYSTECTOMY: SHX55

## 2013-12-26 LAB — CBC WITH DIFFERENTIAL/PLATELET
Basophil #: 0.1 10*3/uL
Basophil %: 1 %
Eosinophil #: 0.1 10*3/uL
Eosinophil %: 1.3 %
HCT: 36.5 %
HGB: 12.5 g/dL
Lymphocyte %: 37.3 %
Lymphs Abs: 2.6 10*3/uL
MCH: 31.7 pg
MCHC: 34.3 g/dL
MCV: 92 fL
Monocyte #: 0.5 10*3/uL
Monocyte %: 7.2 %
Neutrophil #: 3.6 10*3/uL
Neutrophil %: 53.2 %
Platelet: 239 10*3/uL
RBC: 3.96 X10 6/mm 3
RDW: 13.7 %
WBC: 6.9 10*3/uL

## 2013-12-26 LAB — COMPREHENSIVE METABOLIC PANEL
ALBUMIN: 3 g/dL — AB (ref 3.4–5.0)
ALK PHOS: 72 U/L
AST: 62 U/L — AB (ref 15–37)
Anion Gap: 5 — ABNORMAL LOW (ref 7–16)
BUN: 8 mg/dL (ref 7–18)
Bilirubin,Total: 0.4 mg/dL (ref 0.2–1.0)
CHLORIDE: 111 mmol/L — AB (ref 98–107)
Calcium, Total: 8.4 mg/dL — ABNORMAL LOW (ref 8.5–10.1)
Co2: 27 mmol/L (ref 21–32)
Creatinine: 0.92 mg/dL (ref 0.60–1.30)
GLUCOSE: 96 mg/dL (ref 65–99)
Osmolality: 283 (ref 275–301)
POTASSIUM: 3.9 mmol/L (ref 3.5–5.1)
SGPT (ALT): 59 U/L (ref 12–78)
SODIUM: 143 mmol/L (ref 136–145)
Total Protein: 6.2 g/dL — ABNORMAL LOW (ref 6.4–8.2)

## 2013-12-31 LAB — PATHOLOGY REPORT

## 2014-01-28 ENCOUNTER — Telehealth: Payer: Self-pay | Admitting: *Deleted

## 2014-01-28 DIAGNOSIS — I10 Essential (primary) hypertension: Secondary | ICD-10-CM

## 2014-01-28 MED ORDER — ATENOLOL 50 MG PO TABS: ORAL_TABLET | ORAL | Status: DC

## 2014-01-28 MED ORDER — HYDROCHLOROTHIAZIDE 25 MG PO TABS: 25.0000 mg | ORAL_TABLET | Freq: Every day | ORAL | Status: DC

## 2014-01-28 NOTE — Telephone Encounter (Signed)
Patient has finally been able to get an appointment with a primary care Dr. But they are unable to see her until October 1.  She is requesting refills of her BP medication to last until she sees this new Primary care.

## 2014-02-20 ENCOUNTER — Emergency Department: Payer: Self-pay | Admitting: Emergency Medicine

## 2014-02-20 IMAGING — DX DG TOE 5TH LEFT
3 series · 3 of 3 positions shown · non-contrast
Comparison: Prior radiographs of the left foot [DATE] .

CLINICAL DATA: Toe injury, hit with door

EXAM:
DG TOE 5TH LEFT

[toe ap]
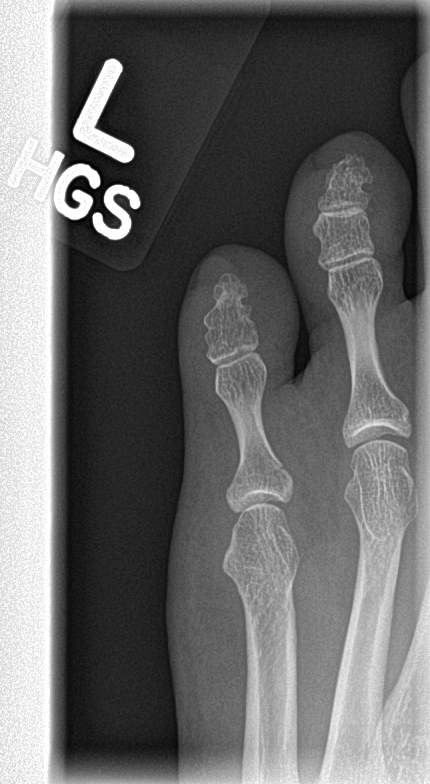

[toe obl]
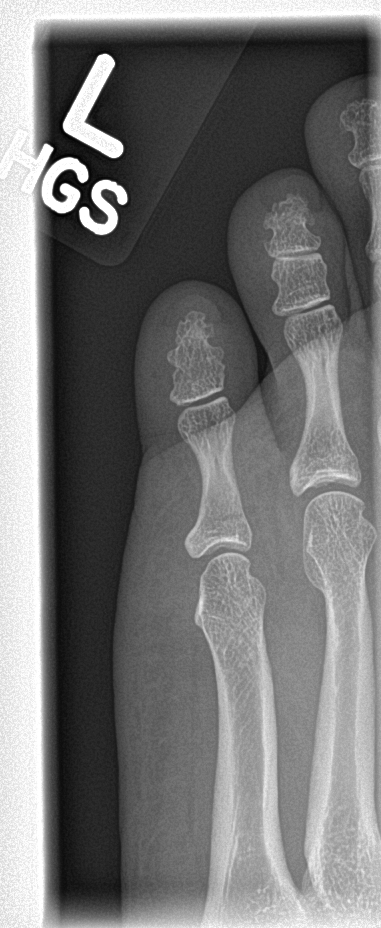

[toe lat]
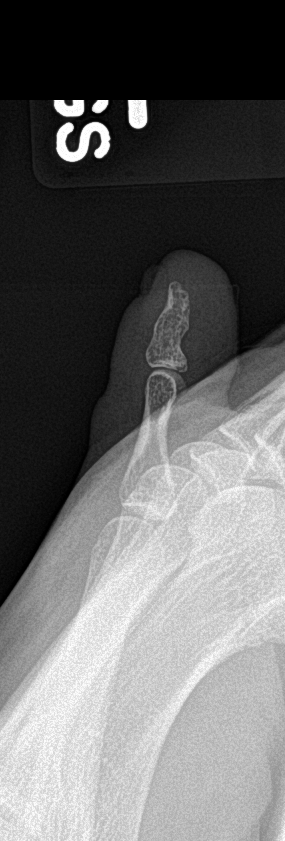

[3 of 3 positions shown; findings below may reference images not displayed]

FINDINGS: Nondisplaced fracture through the base of the proximal phalanx of
the little toe. Soft tissue swelling. Congenital fusion of the
middle distal phalanges of the fifth toe. Normal bony
mineralization. No lytic or blastic osseous lesion
IMPRESSION: Nondisplaced fracture through the base of the proximal phalanx of
the fifth toe.

## 2014-03-19 ENCOUNTER — Emergency Department: Payer: Self-pay | Admitting: Internal Medicine

## 2014-03-19 IMAGING — CR DG FOOT COMPLETE 3+V*L*
1 series · 3 of 3 positions shown · non-contrast
Comparison: [DATE]

CLINICAL DATA: Pain fifth toe

EXAM:
LEFT FOOT - COMPLETE 3+ VIEW

[Series 1: x foot ap left · 0.14mm/px · 3 of 3 slices shown]
[im 1/3]
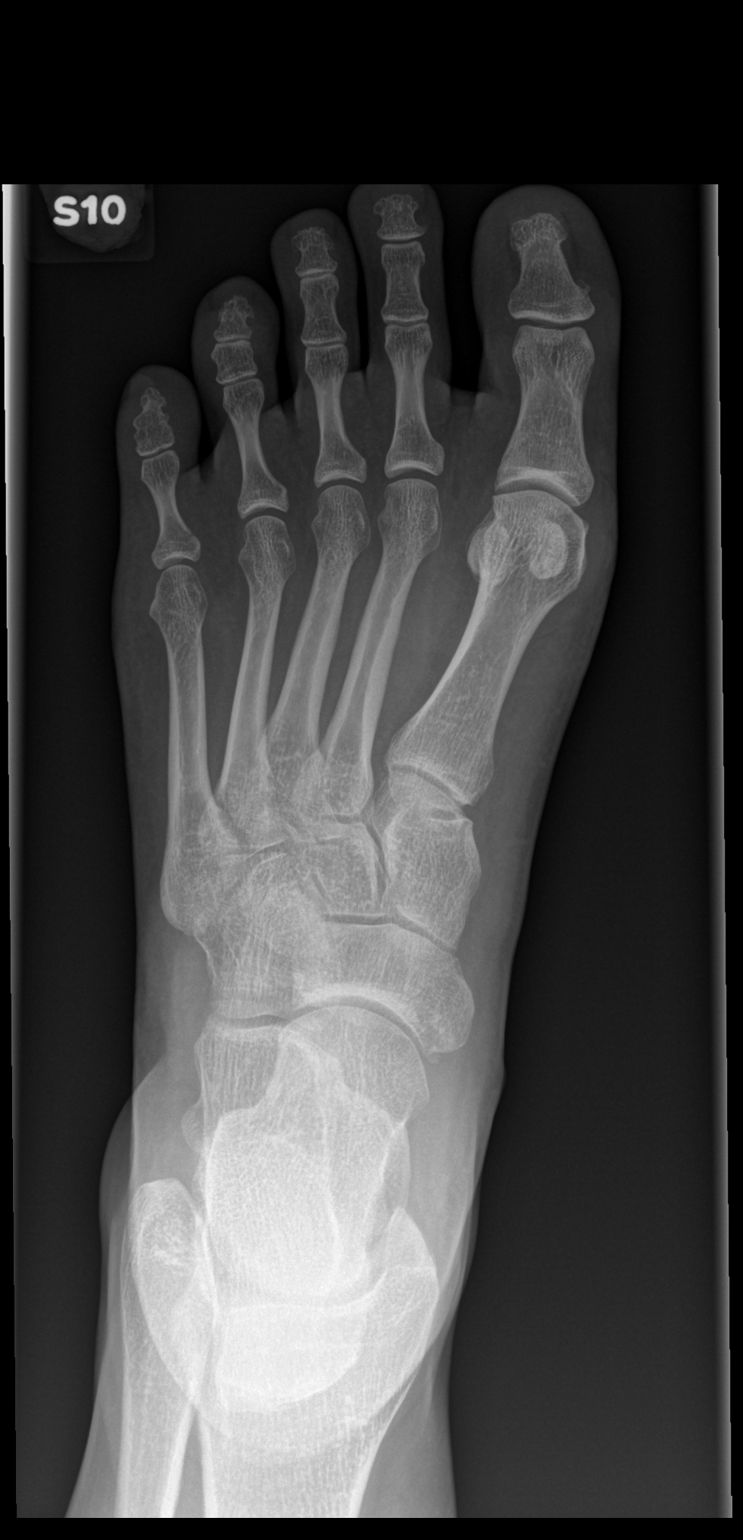
[im 2/3]
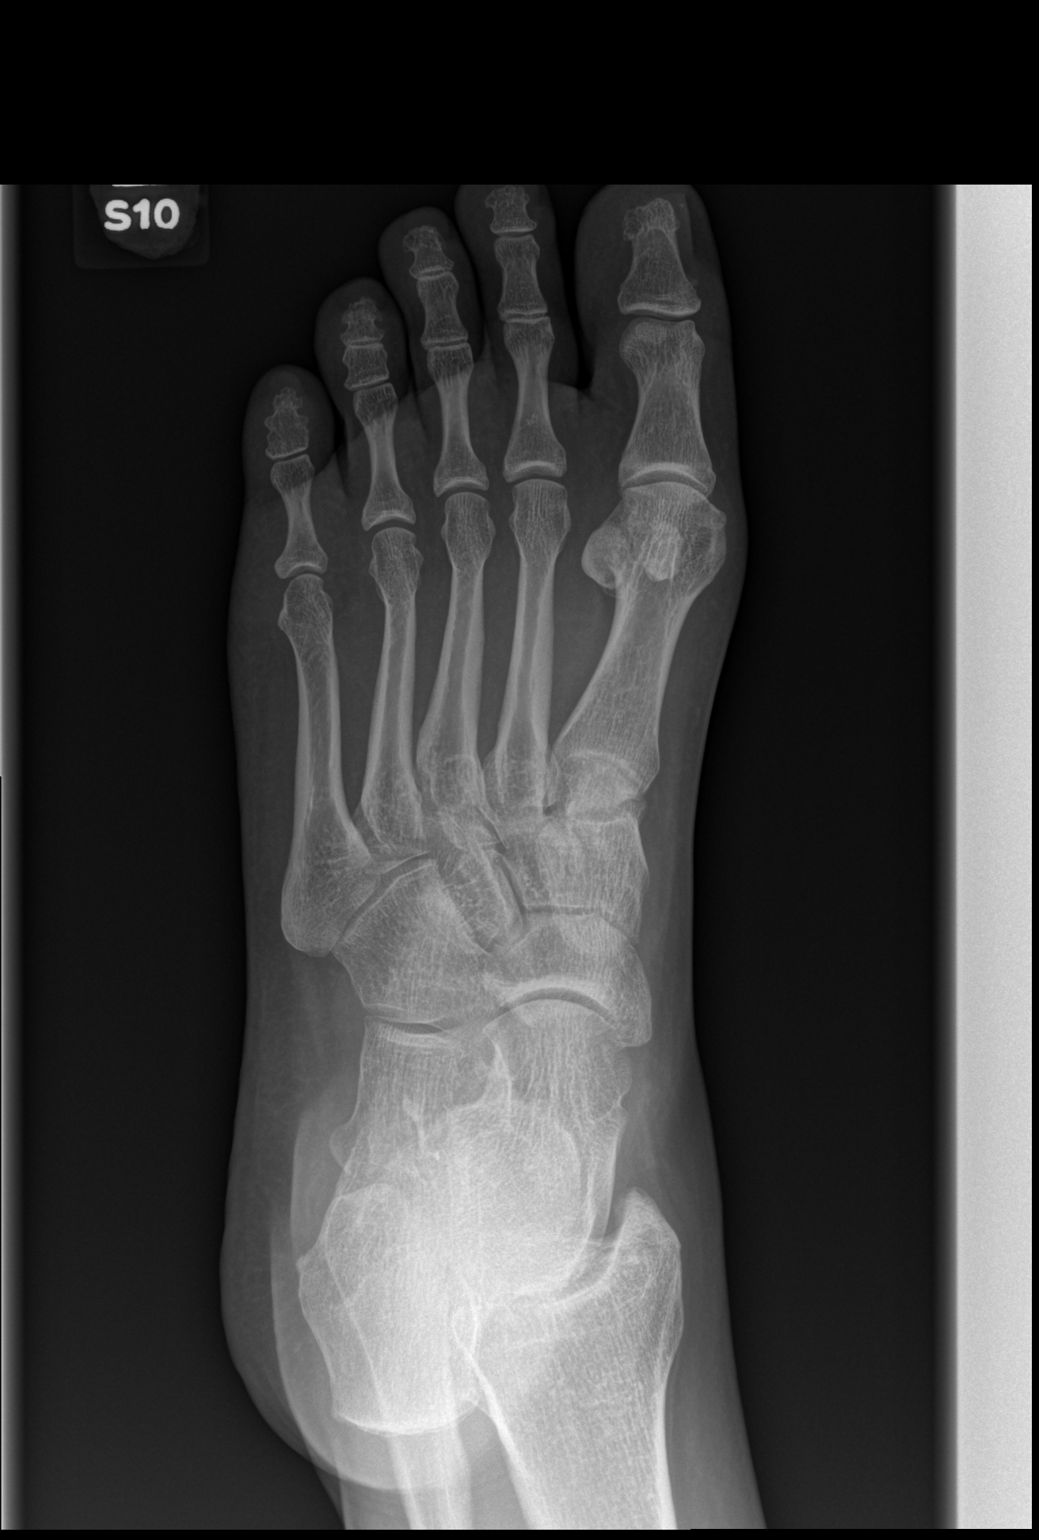
[im 3/3]
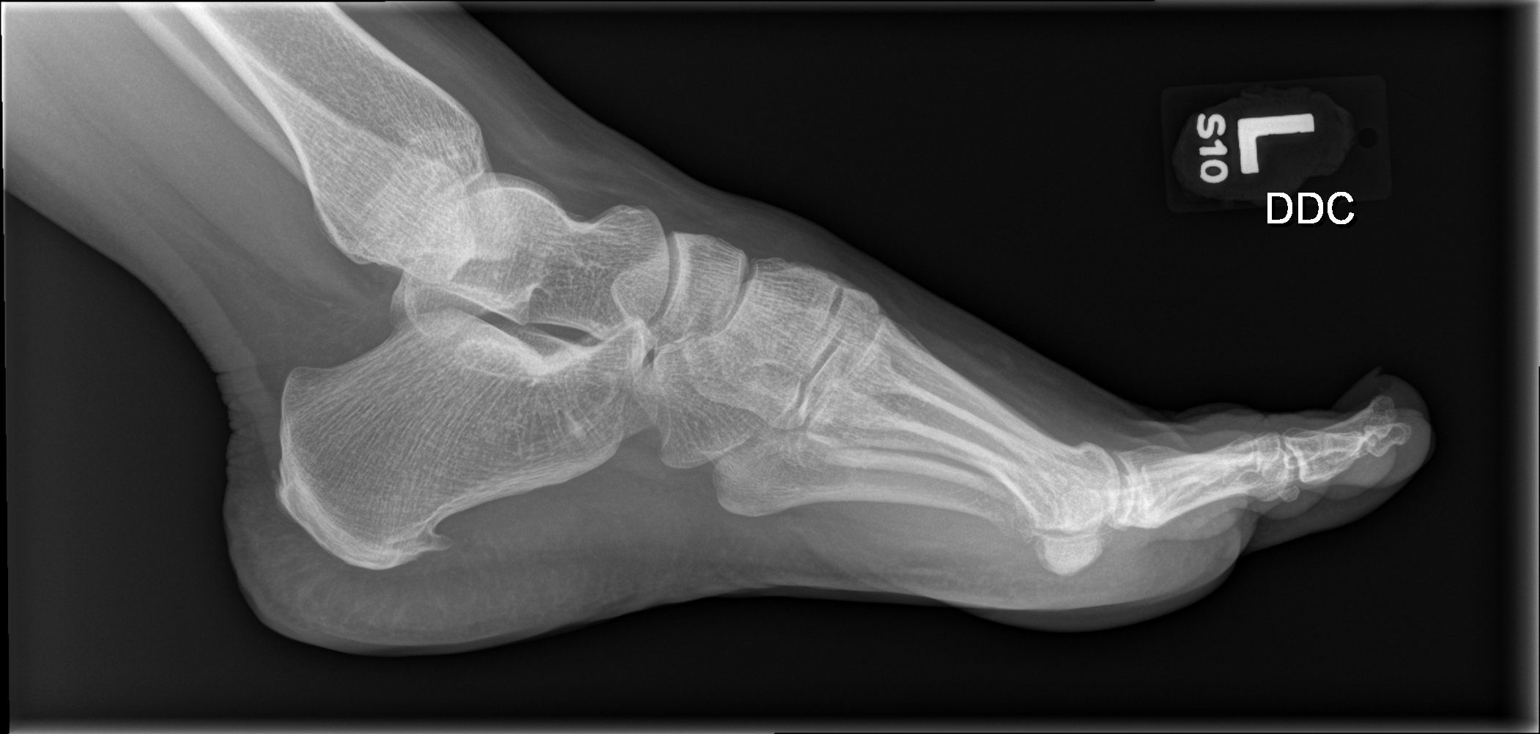

[3 of 3 positions shown; findings below may reference images not displayed]

FINDINGS: Fracture the base of the fifth proximal phalanx shows partial
interval healing since prior study. No significant displacement. No
new fracture.
IMPRESSION: Healing fracture base of the fifth proximal phalanx.

## 2014-05-03 ENCOUNTER — Encounter: Payer: Self-pay | Admitting: Family Medicine

## 2014-06-17 DIAGNOSIS — E781 Pure hyperglyceridemia: Secondary | ICD-10-CM | POA: Insufficient documentation

## 2014-06-26 ENCOUNTER — Emergency Department: Payer: Self-pay | Admitting: Emergency Medicine

## 2014-08-10 ENCOUNTER — Encounter: Payer: Self-pay | Admitting: Family Medicine

## 2014-08-10 ENCOUNTER — Ambulatory Visit (INDEPENDENT_AMBULATORY_CARE_PROVIDER_SITE_OTHER): Payer: 59 | Admitting: Family Medicine

## 2014-08-10 VITALS — BP 119/85 | HR 73 | Ht 64.0 in | Wt 194.0 lb

## 2014-08-10 DIAGNOSIS — Z01419 Encounter for gynecological examination (general) (routine) without abnormal findings: Secondary | ICD-10-CM

## 2014-08-10 DIAGNOSIS — Z124 Encounter for screening for malignant neoplasm of cervix: Secondary | ICD-10-CM

## 2014-08-10 DIAGNOSIS — Z1151 Encounter for screening for human papillomavirus (HPV): Secondary | ICD-10-CM

## 2014-08-10 NOTE — Patient Instructions (Signed)
Preventive Care for Adults A healthy lifestyle and preventive care can promote health and wellness. Preventive health guidelines for women include the following key practices.  A routine yearly physical is a good way to check with your health care provider about your health and preventive screening. It is a chance to share any concerns and updates on your health and to receive a thorough exam.  Visit your dentist for a routine exam and preventive care every 6 months. Brush your teeth twice a day and floss once a day. Good oral hygiene prevents tooth decay and gum disease.  The frequency of eye exams is based on your age, health, family medical history, use of contact lenses, and other factors. Follow your health care provider's recommendations for frequency of eye exams.  Eat a healthy diet. Foods like vegetables, fruits, whole grains, low-fat dairy products, and lean protein foods contain the nutrients you need without too many calories. Decrease your intake of foods high in solid fats, added sugars, and salt. Eat the right amount of calories for you.Get information about a proper diet from your health care provider, if necessary.  Regular physical exercise is one of the most important things you can do for your health. Most adults should get at least 150 minutes of moderate-intensity exercise (any activity that increases your heart rate and causes you to sweat) each week. In addition, most adults need muscle-strengthening exercises on 2 or more days a week.  Maintain a healthy weight. The body mass index (BMI) is a screening tool to identify possible weight problems. It provides an estimate of body fat based on height and weight. Your health care provider can find your BMI and can help you achieve or maintain a healthy weight.For adults 20 years and older:  A BMI below 18.5 is considered underweight.  A BMI of 18.5 to 24.9 is normal.  A BMI of 25 to 29.9 is considered overweight.  A BMI of  30 and above is considered obese.  Maintain normal blood lipids and cholesterol levels by exercising and minimizing your intake of saturated fat. Eat a balanced diet with plenty of fruit and vegetables. Blood tests for lipids and cholesterol should begin at age 76 and be repeated every 5 years. If your lipid or cholesterol levels are high, you are over 50, or you are at high risk for heart disease, you may need your cholesterol levels checked more frequently.Ongoing high lipid and cholesterol levels should be treated with medicines if diet and exercise are not working.  If you smoke, find out from your health care provider how to quit. If you do not use tobacco, do not start.  Lung cancer screening is recommended for adults aged 22-80 years who are at high risk for developing lung cancer because of a history of smoking. A yearly low-dose CT scan of the lungs is recommended for people who have at least a 30-pack-year history of smoking and are a current smoker or have quit within the past 15 years. A pack year of smoking is smoking an average of 1 pack of cigarettes a day for 1 year (for example: 1 pack a day for 30 years or 2 packs a day for 15 years). Yearly screening should continue until the smoker has stopped smoking for at least 15 years. Yearly screening should be stopped for people who develop a health problem that would prevent them from having lung cancer treatment.  If you are pregnant, do not drink alcohol. If you are breastfeeding,  be very cautious about drinking alcohol. If you are not pregnant and choose to drink alcohol, do not have more than 1 drink per day. One drink is considered to be 12 ounces (355 mL) of beer, 5 ounces (148 mL) of wine, or 1.5 ounces (44 mL) of liquor.  Avoid use of street drugs. Do not share needles with anyone. Ask for help if you need support or instructions about stopping the use of drugs.  High blood pressure causes heart disease and increases the risk of  stroke. Your blood pressure should be checked at least every 1 to 2 years. Ongoing high blood pressure should be treated with medicines if weight loss and exercise do not work.  If you are 75-52 years old, ask your health care provider if you should take aspirin to prevent strokes.  Diabetes screening involves taking a blood sample to check your fasting blood sugar level. This should be done once every 3 years, after age 15, if you are within normal weight and without risk factors for diabetes. Testing should be considered at a younger age or be carried out more frequently if you are overweight and have at least 1 risk factor for diabetes.  Breast cancer screening is essential preventive care for women. You should practice "breast self-awareness." This means understanding the normal appearance and feel of your breasts and may include breast self-examination. Any changes detected, no matter how small, should be reported to a health care provider. Women in their 58s and 30s should have a clinical breast exam (CBE) by a health care provider as part of a regular health exam every 1 to 3 years. After age 16, women should have a CBE every year. Starting at age 53, women should consider having a mammogram (breast X-ray test) every year. Women who have a family history of breast cancer should talk to their health care provider about genetic screening. Women at a high risk of breast cancer should talk to their health care providers about having an MRI and a mammogram every year.  Breast cancer gene (BRCA)-related cancer risk assessment is recommended for women who have family members with BRCA-related cancers. BRCA-related cancers include breast, ovarian, tubal, and peritoneal cancers. Having family members with these cancers may be associated with an increased risk for harmful changes (mutations) in the breast cancer genes BRCA1 and BRCA2. Results of the assessment will determine the need for genetic counseling and  BRCA1 and BRCA2 testing.  Routine pelvic exams to screen for cancer are no longer recommended for nonpregnant women who are considered low risk for cancer of the pelvic organs (ovaries, uterus, and vagina) and who do not have symptoms. Ask your health care provider if a screening pelvic exam is right for you.  If you have had past treatment for cervical cancer or a condition that could lead to cancer, you need Pap tests and screening for cancer for at least 20 years after your treatment. If Pap tests have been discontinued, your risk factors (such as having a new sexual partner) need to be reassessed to determine if screening should be resumed. Some women have medical problems that increase the chance of getting cervical cancer. In these cases, your health care provider may recommend more frequent screening and Pap tests.  The HPV test is an additional test that may be used for cervical cancer screening. The HPV test looks for the virus that can cause the cell changes on the cervix. The cells collected during the Pap test can be  tested for HPV. The HPV test could be used to screen women aged 30 years and older, and should be used in women of any age who have unclear Pap test results. After the age of 30, women should have HPV testing at the same frequency as a Pap test.  Colorectal cancer can be detected and often prevented. Most routine colorectal cancer screening begins at the age of 50 years and continues through age 75 years. However, your health care provider may recommend screening at an earlier age if you have risk factors for colon cancer. On a yearly basis, your health care provider may provide home test kits to check for hidden blood in the stool. Use of a small camera at the end of a tube, to directly examine the colon (sigmoidoscopy or colonoscopy), can detect the earliest forms of colorectal cancer. Talk to your health care provider about this at age 50, when routine screening begins. Direct  exam of the colon should be repeated every 5-10 years through age 75 years, unless early forms of pre-cancerous polyps or small growths are found.  People who are at an increased risk for hepatitis B should be screened for this virus. You are considered at high risk for hepatitis B if:  You were born in a country where hepatitis B occurs often. Talk with your health care provider about which countries are considered high risk.  Your parents were born in a high-risk country and you have not received a shot to protect against hepatitis B (hepatitis B vaccine).  You have HIV or AIDS.  You use needles to inject street drugs.  You live with, or have sex with, someone who has hepatitis B.  You get hemodialysis treatment.  You take certain medicines for conditions like cancer, organ transplantation, and autoimmune conditions.  Hepatitis C blood testing is recommended for all people born from 1945 through 1965 and any individual with known risks for hepatitis C.  Practice safe sex. Use condoms and avoid high-risk sexual practices to reduce the spread of sexually transmitted infections (STIs). STIs include gonorrhea, chlamydia, syphilis, trichomonas, herpes, HPV, and human immunodeficiency virus (HIV). Herpes, HIV, and HPV are viral illnesses that have no cure. They can result in disability, cancer, and death.  You should be screened for sexually transmitted illnesses (STIs) including gonorrhea and chlamydia if:  You are sexually active and are younger than 24 years.  You are older than 24 years and your health care provider tells you that you are at risk for this type of infection.  Your sexual activity has changed since you were last screened and you are at an increased risk for chlamydia or gonorrhea. Ask your health care provider if you are at risk.  If you are at risk of being infected with HIV, it is recommended that you take a prescription medicine daily to prevent HIV infection. This is  called preexposure prophylaxis (PrEP). You are considered at risk if:  You are a heterosexual woman, are sexually active, and are at increased risk for HIV infection.  You take drugs by injection.  You are sexually active with a partner who has HIV.  Talk with your health care provider about whether you are at high risk of being infected with HIV. If you choose to begin PrEP, you should first be tested for HIV. You should then be tested every 3 months for as long as you are taking PrEP.  Osteoporosis is a disease in which the bones lose minerals and strength   with aging. This can result in serious bone fractures or breaks. The risk of osteoporosis can be identified using a bone density scan. Women ages 65 years and over and women at risk for fractures or osteoporosis should discuss screening with their health care providers. Ask your health care provider whether you should take a calcium supplement or vitamin D to reduce the rate of osteoporosis.  Menopause can be associated with physical symptoms and risks. Hormone replacement therapy is available to decrease symptoms and risks. You should talk to your health care provider about whether hormone replacement therapy is right for you.  Use sunscreen. Apply sunscreen liberally and repeatedly throughout the day. You should seek shade when your shadow is shorter than you. Protect yourself by wearing long sleeves, pants, a wide-brimmed hat, and sunglasses year round, whenever you are outdoors.  Once a month, do a whole body skin exam, using a mirror to look at the skin on your back. Tell your health care provider of new moles, moles that have irregular borders, moles that are larger than a pencil eraser, or moles that have changed in shape or color.  Stay current with required vaccines (immunizations).  Influenza vaccine. All adults should be immunized every year.  Tetanus, diphtheria, and acellular pertussis (Td, Tdap) vaccine. Pregnant women should  receive 1 dose of Tdap vaccine during each pregnancy. The dose should be obtained regardless of the length of time since the last dose. Immunization is preferred during the 27th-36th week of gestation. An adult who has not previously received Tdap or who does not know her vaccine status should receive 1 dose of Tdap. This initial dose should be followed by tetanus and diphtheria toxoids (Td) booster doses every 10 years. Adults with an unknown or incomplete history of completing a 3-dose immunization series with Td-containing vaccines should begin or complete a primary immunization series including a Tdap dose. Adults should receive a Td booster every 10 years.  Varicella vaccine. An adult without evidence of immunity to varicella should receive 2 doses or a second dose if she has previously received 1 dose. Pregnant females who do not have evidence of immunity should receive the first dose after pregnancy. This first dose should be obtained before leaving the health care facility. The second dose should be obtained 4-8 weeks after the first dose.  Human papillomavirus (HPV) vaccine. Females aged 13-26 years who have not received the vaccine previously should obtain the 3-dose series. The vaccine is not recommended for use in pregnant females. However, pregnancy testing is not needed before receiving a dose. If a female is found to be pregnant after receiving a dose, no treatment is needed. In that case, the remaining doses should be delayed until after the pregnancy. Immunization is recommended for any person with an immunocompromised condition through the age of 26 years if she did not get any or all doses earlier. During the 3-dose series, the second dose should be obtained 4-8 weeks after the first dose. The third dose should be obtained 24 weeks after the first dose and 16 weeks after the second dose.  Zoster vaccine. One dose is recommended for adults aged 60 years or older unless certain conditions are  present.  Measles, mumps, and rubella (MMR) vaccine. Adults born before 1957 generally are considered immune to measles and mumps. Adults born in 1957 or later should have 1 or more doses of MMR vaccine unless there is a contraindication to the vaccine or there is laboratory evidence of immunity to   each of the three diseases. A routine second dose of MMR vaccine should be obtained at least 28 days after the first dose for students attending postsecondary schools, health care workers, or international travelers. People who received inactivated measles vaccine or an unknown type of measles vaccine during 1963-1967 should receive 2 doses of MMR vaccine. People who received inactivated mumps vaccine or an unknown type of mumps vaccine before 1979 and are at high risk for mumps infection should consider immunization with 2 doses of MMR vaccine. For females of childbearing age, rubella immunity should be determined. If there is no evidence of immunity, females who are not pregnant should be vaccinated. If there is no evidence of immunity, females who are pregnant should delay immunization until after pregnancy. Unvaccinated health care workers born before 1957 who lack laboratory evidence of measles, mumps, or rubella immunity or laboratory confirmation of disease should consider measles and mumps immunization with 2 doses of MMR vaccine or rubella immunization with 1 dose of MMR vaccine.  Pneumococcal 13-valent conjugate (PCV13) vaccine. When indicated, a person who is uncertain of her immunization history and has no record of immunization should receive the PCV13 vaccine. An adult aged 19 years or older who has certain medical conditions and has not been previously immunized should receive 1 dose of PCV13 vaccine. This PCV13 should be followed with a dose of pneumococcal polysaccharide (PPSV23) vaccine. The PPSV23 vaccine dose should be obtained at least 8 weeks after the dose of PCV13 vaccine. An adult aged 19  years or older who has certain medical conditions and previously received 1 or more doses of PPSV23 vaccine should receive 1 dose of PCV13. The PCV13 vaccine dose should be obtained 1 or more years after the last PPSV23 vaccine dose.  Pneumococcal polysaccharide (PPSV23) vaccine. When PCV13 is also indicated, PCV13 should be obtained first. All adults aged 65 years and older should be immunized. An adult younger than age 65 years who has certain medical conditions should be immunized. Any person who resides in a nursing home or long-term care facility should be immunized. An adult smoker should be immunized. People with an immunocompromised condition and certain other conditions should receive both PCV13 and PPSV23 vaccines. People with human immunodeficiency virus (HIV) infection should be immunized as soon as possible after diagnosis. Immunization during chemotherapy or radiation therapy should be avoided. Routine use of PPSV23 vaccine is not recommended for American Indians, [place] Natives, or people younger than 65 years unless there are medical conditions that require PPSV23 vaccine. When indicated, people who have unknown immunization and have no record of immunization should receive PPSV23 vaccine. One-time revaccination 5 years after the first dose of PPSV23 is recommended for people aged 19-64 years who have chronic kidney failure, nephrotic syndrome, asplenia, or immunocompromised conditions. People who received 1-2 doses of PPSV23 before age 65 years should receive another dose of PPSV23 vaccine at age 65 years or later if at least 5 years have passed since the previous dose. Doses of PPSV23 are not needed for people immunized with PPSV23 at or after age 65 years.  Meningococcal vaccine. Adults with asplenia or persistent complement component deficiencies should receive 2 doses of quadrivalent meningococcal conjugate (MenACWY-D) vaccine. The doses should be obtained at least 2 months apart.  Microbiologists working with certain meningococcal bacteria, military recruits, people at risk during an outbreak, and people who travel to or live in countries with a high rate of meningitis should be immunized. A first-year college student up through age   21 years who is living in a residence hall should receive a dose if she did not receive a dose on or after her 16th birthday. Adults who have certain high-risk conditions should receive one or more doses of vaccine.  Hepatitis A vaccine. Adults who wish to be protected from this disease, have certain high-risk conditions, work with hepatitis A-infected animals, work in hepatitis A research labs, or travel to or work in countries with a high rate of hepatitis A should be immunized. Adults who were previously unvaccinated and who anticipate close contact with an international adoptee during the first 60 days after arrival in the [place] from a country with a high rate of hepatitis A should be immunized.  Hepatitis B vaccine. Adults who wish to be protected from this disease, have certain high-risk conditions, may be exposed to blood or other infectious body fluids, are household contacts or sex partners of hepatitis B positive people, are clients or workers in certain care facilities, or travel to or work in countries with a high rate of hepatitis B should be immunized.  Haemophilus influenzae type b (Hib) vaccine. A previously unvaccinated person with asplenia or sickle cell disease or having a scheduled splenectomy should receive 1 dose of Hib vaccine. Regardless of previous immunization, a recipient of a hematopoietic stem cell transplant should receive a 3-dose series 6-12 months after her successful transplant. Hib vaccine is not recommended for adults with HIV infection. Preventive Services / Frequency Ages 64 to 68 years  Blood pressure check.** / Every 1 to 2 years.  Lipid and cholesterol check.** / Every 5 years beginning at age  22.  Clinical breast exam.** / Every 3 years for women in their 88s and 53s.  BRCA-related cancer risk assessment.** / For women who have family members with a BRCA-related cancer (breast, ovarian, tubal, or peritoneal cancers).  Pap test.** / Every 2 years from ages 90 through 51. Every 3 years starting at age 21 through age 56 or 3 with a history of 3 consecutive normal Pap tests.  HPV screening.** / Every 3 years from ages 24 through ages 1 to 46 with a history of 3 consecutive normal Pap tests.  Hepatitis C blood test.** / For any individual with known risks for hepatitis C.  Skin self-exam. / Monthly.  Influenza vaccine. / Every year.  Tetanus, diphtheria, and acellular pertussis (Tdap, Td) vaccine.** / Consult your health care provider. Pregnant women should receive 1 dose of Tdap vaccine during each pregnancy. 1 dose of Td every 10 years.  Varicella vaccine.** / Consult your health care provider. Pregnant females who do not have evidence of immunity should receive the first dose after pregnancy.  HPV vaccine. / 3 doses over 6 months, if 72 and younger. The vaccine is not recommended for use in pregnant females. However, pregnancy testing is not needed before receiving a dose.  Measles, mumps, rubella (MMR) vaccine.** / You need at least 1 dose of MMR if you were born in 1957 or later. You may also need a 2nd dose. For females of childbearing age, rubella immunity should be determined. If there is no evidence of immunity, females who are not pregnant should be vaccinated. If there is no evidence of immunity, females who are pregnant should delay immunization until after pregnancy.  Pneumococcal 13-valent conjugate (PCV13) vaccine.** / Consult your health care provider.  Pneumococcal polysaccharide (PPSV23) vaccine.** / 1 to 2 doses if you smoke cigarettes or if you have certain conditions.  Meningococcal vaccine.** /  1 dose if you are age 19 to 21 years and a first-year college  student living in a residence hall, or have one of several medical conditions, you need to get vaccinated against meningococcal disease. You may also need additional booster doses.  Hepatitis A vaccine.** / Consult your health care provider.  Hepatitis B vaccine.** / Consult your health care provider.  Haemophilus influenzae type b (Hib) vaccine.** / Consult your health care provider. Ages 40 to 64 years  Blood pressure check.** / Every 1 to 2 years.  Lipid and cholesterol check.** / Every 5 years beginning at age 20 years.  Lung cancer screening. / Every year if you are aged 55-80 years and have a 30-pack-year history of smoking and currently smoke or have quit within the past 15 years. Yearly screening is stopped once you have quit smoking for at least 15 years or develop a health problem that would prevent you from having lung cancer treatment.  Clinical breast exam.** / Every year after age 40 years.  BRCA-related cancer risk assessment.** / For women who have family members with a BRCA-related cancer (breast, ovarian, tubal, or peritoneal cancers).  Mammogram.** / Every year beginning at age 40 years and continuing for as long as you are in good health. Consult with your health care provider.  Pap test.** / Every 3 years starting at age 30 years through age 65 or 70 years with a history of 3 consecutive normal Pap tests.  HPV screening.** / Every 3 years from ages 30 years through ages 65 to 70 years with a history of 3 consecutive normal Pap tests.  Fecal occult blood test (FOBT) of stool. / Every year beginning at age 50 years and continuing until age 75 years. You may not need to do this test if you get a colonoscopy every 10 years.  Flexible sigmoidoscopy or colonoscopy.** / Every 5 years for a flexible sigmoidoscopy or every 10 years for a colonoscopy beginning at age 50 years and continuing until age 75 years.  Hepatitis C blood test.** / For all people born from 1945 through  1965 and any individual with known risks for hepatitis C.  Skin self-exam. / Monthly.  Influenza vaccine. / Every year.  Tetanus, diphtheria, and acellular pertussis (Tdap/Td) vaccine.** / Consult your health care provider. Pregnant women should receive 1 dose of Tdap vaccine during each pregnancy. 1 dose of Td every 10 years.  Varicella vaccine.** / Consult your health care provider. Pregnant females who do not have evidence of immunity should receive the first dose after pregnancy.  Zoster vaccine.** / 1 dose for adults aged 60 years or older.  Measles, mumps, rubella (MMR) vaccine.** / You need at least 1 dose of MMR if you were born in 1957 or later. You may also need a 2nd dose. For females of childbearing age, rubella immunity should be determined. If there is no evidence of immunity, females who are not pregnant should be vaccinated. If there is no evidence of immunity, females who are pregnant should delay immunization until after pregnancy.  Pneumococcal 13-valent conjugate (PCV13) vaccine.** / Consult your health care provider.  Pneumococcal polysaccharide (PPSV23) vaccine.** / 1 to 2 doses if you smoke cigarettes or if you have certain conditions.  Meningococcal vaccine.** / Consult your health care provider.  Hepatitis A vaccine.** / Consult your health care provider.  Hepatitis B vaccine.** / Consult your health care provider.  Haemophilus influenzae type b (Hib) vaccine.** / Consult your health care provider. Ages 65   years and over  Blood pressure check.** / Every 1 to 2 years.  Lipid and cholesterol check.** / Every 5 years beginning at age 22 years.  Lung cancer screening. / Every year if you are aged 73-80 years and have a 30-pack-year history of smoking and currently smoke or have quit within the past 15 years. Yearly screening is stopped once you have quit smoking for at least 15 years or develop a health problem that would prevent you from having lung cancer  treatment.  Clinical breast exam.** / Every year after age 4 years.  BRCA-related cancer risk assessment.** / For women who have family members with a BRCA-related cancer (breast, ovarian, tubal, or peritoneal cancers).  Mammogram.** / Every year beginning at age 40 years and continuing for as long as you are in good health. Consult with your health care provider.  Pap test.** / Every 3 years starting at age 9 years through age 34 or 91 years with 3 consecutive normal Pap tests. Testing can be stopped between 65 and 70 years with 3 consecutive normal Pap tests and no abnormal Pap or HPV tests in the past 10 years.  HPV screening.** / Every 3 years from ages 57 years through ages 64 or 45 years with a history of 3 consecutive normal Pap tests. Testing can be stopped between 65 and 70 years with 3 consecutive normal Pap tests and no abnormal Pap or HPV tests in the past 10 years.  Fecal occult blood test (FOBT) of stool. / Every year beginning at age 15 years and continuing until age 17 years. You may not need to do this test if you get a colonoscopy every 10 years.  Flexible sigmoidoscopy or colonoscopy.** / Every 5 years for a flexible sigmoidoscopy or every 10 years for a colonoscopy beginning at age 86 years and continuing until age 71 years.  Hepatitis C blood test.** / For all people born from [birth year] through 1965 and any individual with known risks for hepatitis C.  Osteoporosis screening.** / A one-time screening for women ages 83 years and over and women at risk for fractures or osteoporosis.  Skin self-exam. / Monthly.  Influenza vaccine. / Every year.  Tetanus, diphtheria, and acellular pertussis (Tdap/Td) vaccine.** / 1 dose of Td every 10 years.  Varicella vaccine.** / Consult your health care provider.  Zoster vaccine.** / 1 dose for adults aged 61 years or older.  Pneumococcal 13-valent conjugate (PCV13) vaccine.** / Consult your health care provider.  Pneumococcal  polysaccharide (PPSV23) vaccine.** / 1 dose for all adults aged 28 years and older.  Meningococcal vaccine.** / Consult your health care provider.  Hepatitis A vaccine.** / Consult your health care provider.  Hepatitis B vaccine.** / Consult your health care provider.  Haemophilus influenzae type b (Hib) vaccine.** / Consult your health care provider. ** Family history and personal history of risk and conditions may change your health care provider's recommendations. Document Released: 08/14/2001 Document Revised: 11/02/2013 Document Reviewed: 11/13/2010 [facility] Patient Information 2015 [place], [place]. This information is not intended to replace advice given to you by your health care provider. Make sure you discuss any questions you have with your health care provider.

## 2014-08-10 NOTE — Progress Notes (Signed)
  Subjective:     [name] is a 34 y.o. female and is here for a comprehensive physical exam. The patient reports problems - having regular cycles with Mirena. would like BTL.Marland Kitchen  History   Social History  . Marital Status: Married    Spouse Name: N/A    Number of Children: N/A  . Years of Education: N/A   Occupational History  . Not on file.   Social History Main Topics  . Smoking status: Current Some Day Smoker -- 0.50 packs/day for 12 years    Types: Cigarettes  . Smokeless tobacco: Never Used  . Alcohol Use: 0.5 oz/week    1 drink(s) per week  . Drug Use: No  . Sexual Activity: Yes   Other Topics Concern  . Not on file   Social History Narrative   Health Maintenance  Topic Date Due  . HEMOGLOBIN A1C  04/21/1981  . FOOT EXAM  04/11/1991  . OPHTHALMOLOGY EXAM  04/11/1991  . URINE MICROALBUMIN  04/11/1991  . INFLUENZA VACCINE  01/30/2014  . PAP SMEAR  07/09/2015  . PNEUMOCOCCAL POLYSACCHARIDE VACCINE (2) 03/02/2018  . TETANUS/TDAP  12/10/2022    The following portions of the patient's history were reviewed and updated as appropriate: allergies, current medications, past family history, past medical history, past social history, past surgical history and problem list.  Review of Systems A comprehensive review of systems was negative.   Objective:    BP 119/85 mmHg  Pulse 73  Ht 5\' 4"  (1.626 m)  Wt 194 lb (87.998 kg)  BMI 33.28 kg/m2  LMP 07/25/2014  Breastfeeding? No General appearance: alert, cooperative and appears stated age Head: Normocephalic, without obvious abnormality, atraumatic Neck: no adenopathy, supple, symmetrical, trachea midline and thyroid not enlarged, symmetric, no tenderness/mass/nodules Lungs: clear to auscultation bilaterally Breasts: normal appearance, no masses or tenderness Heart: regular rate and rhythm, S1, S2 normal, no murmur, click, rub or gallop Abdomen: soft, non-tender; bowel sounds normal; no masses,  no  organomegaly Pelvic: cervix normal in appearance, external genitalia normal, no adnexal masses or tenderness, no cervical motion tenderness, uterus normal size, shape, and consistency, vagina normal without discharge and IUD strings are visualized and a knot is felt at edge of cervix. Extremities: extremities normal, atraumatic, no cyanosis or edema Pulses: 2+ and symmetric Skin: Skin color, texture, turgor normal. No rashes or lesions Lymph nodes: Cervical, supraclavicular, and axillary nodes normal. Neurologic: Grossly normal    Assessment:    Healthy female exam.      Plan:      Problem List Items Addressed This Visit    None    Visit Diagnoses    Screening for malignant neoplasm of cervix    -  Primary    Relevant Orders    Cytology - PAP    Encounter for routine gynecological examination          She is wavering over BTL and vasectomy.  We have scheduled this but then she changed her mind. Risks of BTL were reviewed.  Would be for salpingectomy.  Primary MD handles other issues.   Does not want flu now. See After Visit Summary for Counseling Recommendations

## 2014-08-13 LAB — CYTOLOGY - PAP

## 2014-10-23 NOTE — H&P (Signed)
   Subjective/Chief Complaint RUQ/Epigastric and back pain x 4 hours   History of Present Illness [name] is a pleasant 34 yo F with a history of prior RUQ pain, first episode in may, most recent before today was yesterday.  First episode in May, associated with F/C, nausea, resolved in 5 hours.  It occured 2 hours yesterday then resolved spontaneously.  Began today at 5:30 after fatty food.  Has improved with pain meds.  no nausea/vomiting.  NO fevers/chills.  U/S showed nonmobile stone in gallbladder neck and had + sonographic murphy's sign   Past History HTN H/o glomerulonephritis H/o depression S/p tonsillectomy   Past Med/Surgical Hx:  miscarriage:   glomerulernephritis:   Depression:   Hypertension:   Tonsillectomy:   ALLERGIES:  No Known Allergies:   Family and Social History:  Family History Coronary Artery Disease  Hypertension  Cancer  Breast cancer in [place]   Social History positive  tobacco, positive ETOH, Social EtoH   + Tobacco Current (within 1 year)   Place of Living Home   Review of Systems:  Subjective/Chief Complaint RUQ pain   Fever/Chills No   Cough No   Sputum No   Abdominal Pain Yes   Diarrhea No   Constipation No   Nausea/Vomiting No   SOB/DOE No   Chest Pain No   Dysuria No   Tolerating Diet Yes   Physical Exam:  GEN well developed, well nourished, no acute distress   HEENT pink conjunctivae, PERRL, good dentition   RESP normal resp effort  clear BS  no use of accessory muscles   CARD regular rate  no murmur  no thrills   ABD positive tenderness  no hernia  soft  normal BS  no Adominal Mass   LYMPH negative neck, negative axillae   SKIN normal to palpation, No rashes, skin turgor good   NEURO negative rigidity, negative tremor, follows commands, strength:, motor/sensory function intact   PSYCH A+O to time, place, person, good insight    Assessment/Admission Diagnosis [name] is a pleasant 34 yo with a history  of recurrent RUQ pain, nonmobile gallstone in gb neck.  Probable gallbladder hydrops vs cholecystectomy.   Plan Admit, IV pain meds, IV antibiotics.  NPO after midnight.  Will defer to Dr. Leanora Cover but will likely require cholecystectomy.   Electronic Signatures: Floyde Parkins (MD)  (Signed 26-Jun-15 22:11)  Authored: CHIEF COMPLAINT and HISTORY, PAST MEDICAL/SURGIAL HISTORY, ALLERGIES, FAMILY AND SOCIAL HISTORY, REVIEW OF SYSTEMS, PHYSICAL EXAM, ASSESSMENT AND PLAN   Last Updated: 26-Jun-15 22:11 by Floyde Parkins (MD)

## 2014-10-23 NOTE — Op Note (Signed)
PATIENT NAME:  [name], [name] MR#:  [id number] DATE OF BIRTH:  May 10, 1981  DATE OF PROCEDURE:  12/26/2013  PREOPERATIVE DIAGNOSIS: Acute cholecystitis.   POSTOPERATIVE DIAGNOSIS: Symptomatic cholelithiasis.   OPERATION PERFORMED: Laparoscopic cholecystectomy.   SURGEON: Consuela Mimes, MD   ANESTHESIA: General.   PROCEDURE IN DETAIL: The patient was placed supine on the Operating Room table and prepped and draped in the usual sterile fashion. A 15 mmHg CO2 pneumoperitoneum was created via a Veress needle in the infraumbilical position and this was replaced with a 5 mm trocar and a 30 degree angled laparoscope. Remaining trocars were placed under direct visualization. The fundus of the gallbladder was retracted superiorly and ventrally. The infundibulum was retracted laterally, opening up the triangle of Calot. Distances within the triangle were very short with a clear visualization of the common bile duct and cystic duct and infundibulum of the gallbladder. There was only about 1 cm between them. Two hemoclips were placed on the cystic duct and an additional clip was placed on the cystic duct infundibular junction, and the cystic duct was divided. Similarly, the cystic artery was doubly clipped and divided, and there was a posterior cystic artery that required a single clip. The gallbladder was removed from the liver bed with the electrocautery, placed in an Endo Catch bag and extracted from the abdomen via the epigastric port. This port site fascia was enlarged to the size of the stones and then it was closed with a figure-of-8 in a 0 Vicryl suture using the laparoscopic puncture closure device. The right upper quadrant was re-inspected and was hemostatic with no evidence of bile staining and the clips were secure and therefore no irrigation was performed. The peritoneum was desufflated and decannulated, and all 4 skin sites were closed with subcuticular 5-0 Monocryl and suture strips. The  patient tolerated the procedure well. There were no complications.     ____________________________ Consuela Mimes, MD wfm:lm D: 12/26/2013 12:18:33 ET T: 12/26/2013 22:26:05 ET JOB#: [id number]  cc: Consuela Mimes, MD, <Dictator> Consuela Mimes MD ELECTRONICALLY SIGNED 12/27/2013 16:40

## 2014-12-16 IMAGING — CR DG FEMUR 2+V*R*
4 series · 4 of 4 positions shown · non-contrast
Comparison: None.

CLINICAL DATA: Pain in the mid thigh for 1 week, no injury

EXAM:
RIGHT FEMUR 2 VIEWS

[femur ap (1 of 2)]
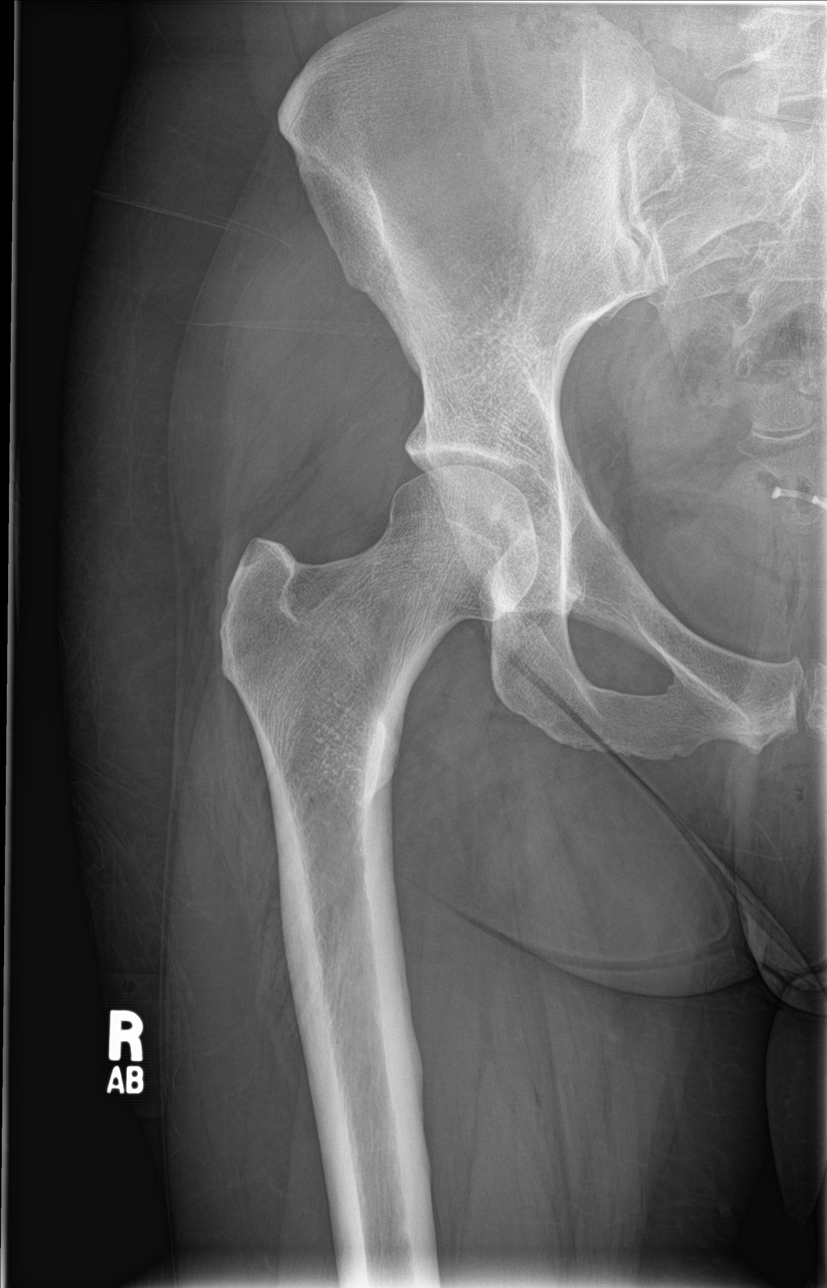

[femur ap (2 of 2)]
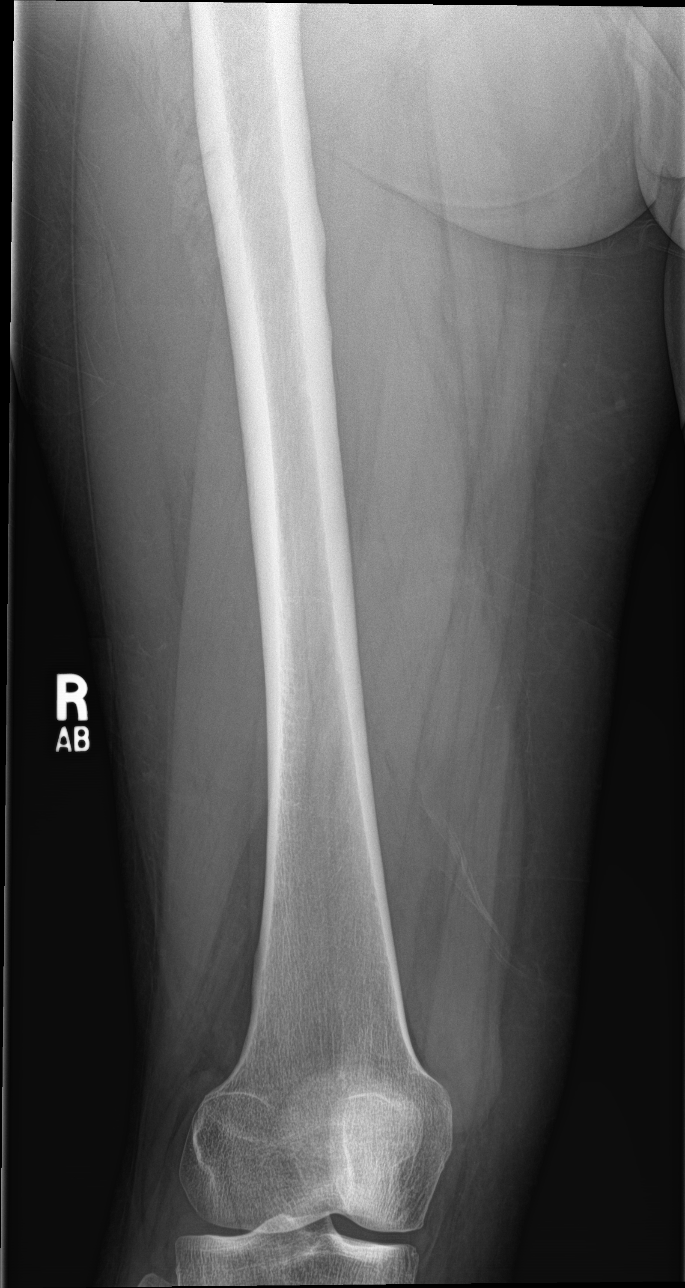

[femur lat (1 of 2)]
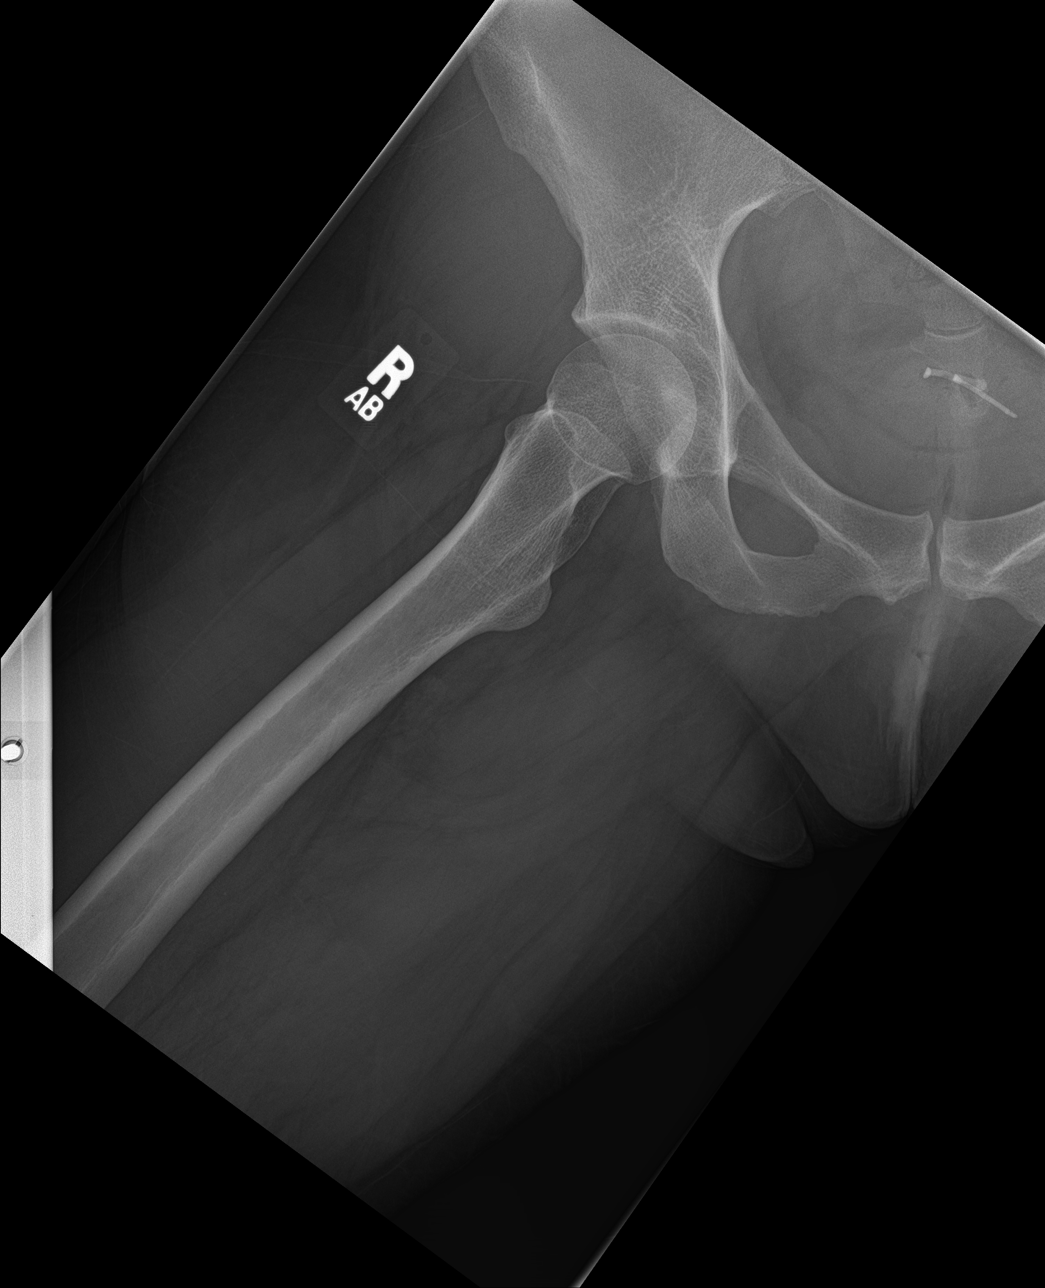

[femur lat (2 of 2)]
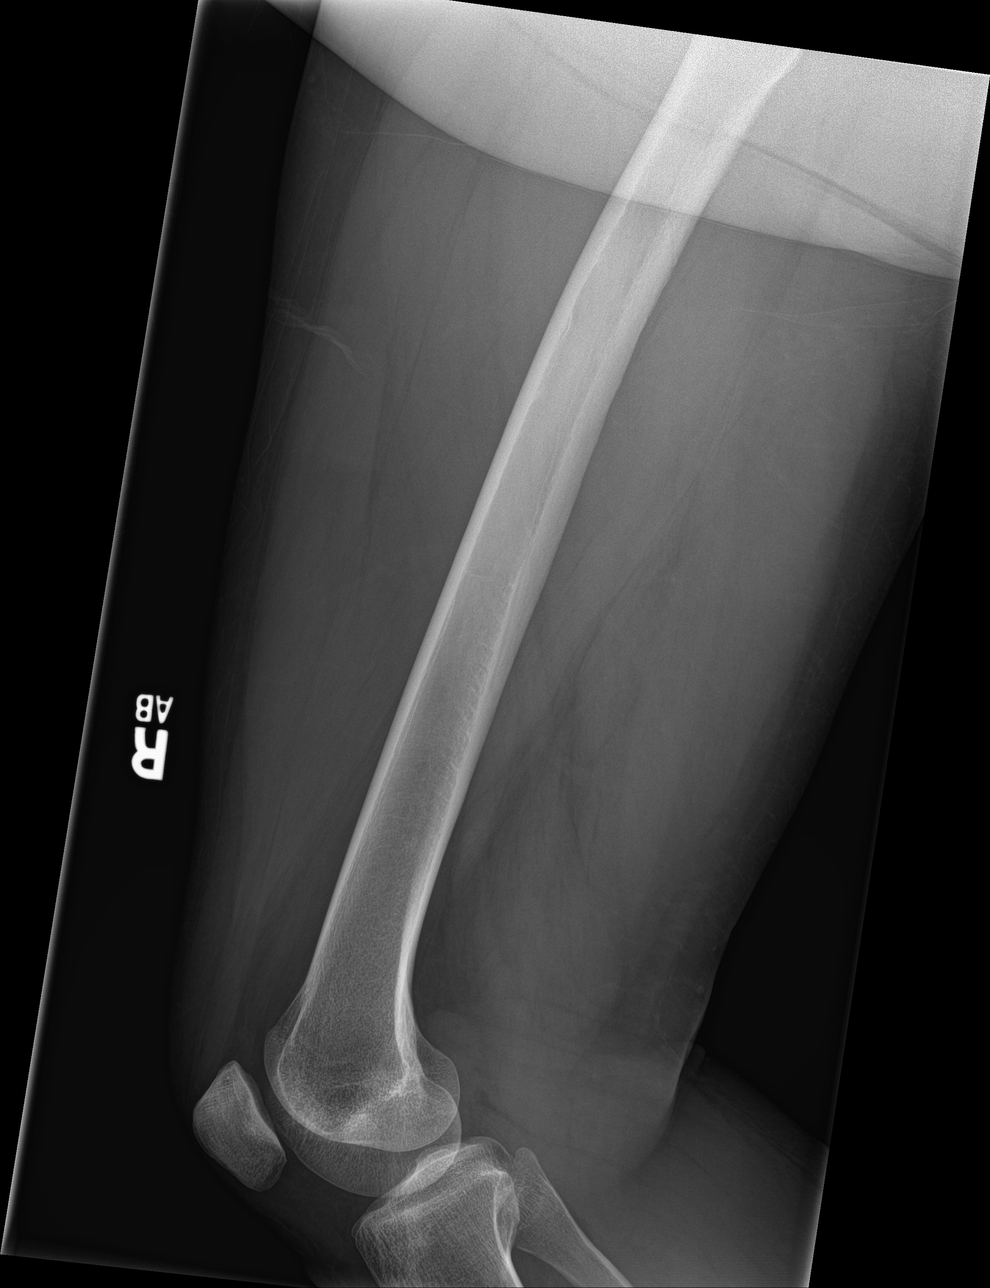

[4 of 4 positions shown; findings below may reference images not displayed]

FINDINGS: No acute abnormality is seen. Alignment is normal. The right hip
joint space appears normal and what is seen of the right knee joint
space also appears normal.
IMPRESSION: Negative.

## 2014-12-16 IMAGING — US US EXTREM LOW VENOUS*R*
1 series · 13 of 24 positions shown · non-contrast
Comparison: None.

CLINICAL DATA: Right lower extremity pain.



[Series 1: us extrem low venous*right* · 0.07mm/px · 13 of 35 slices shown]
[im 1/35]
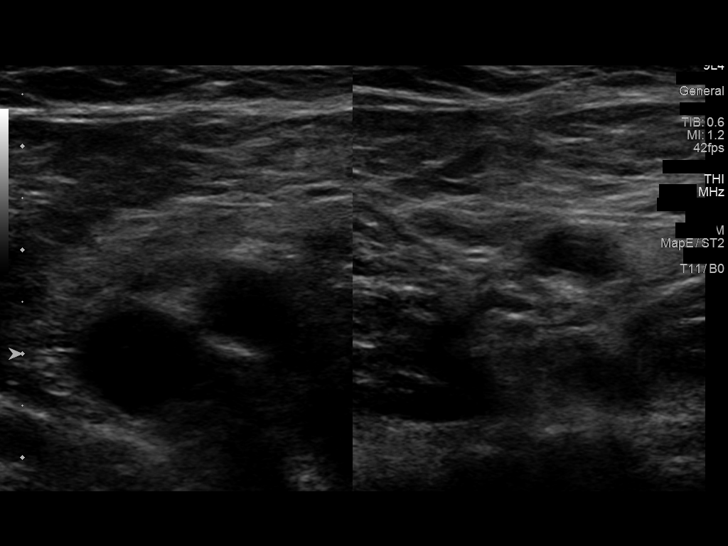
[im 3/35]
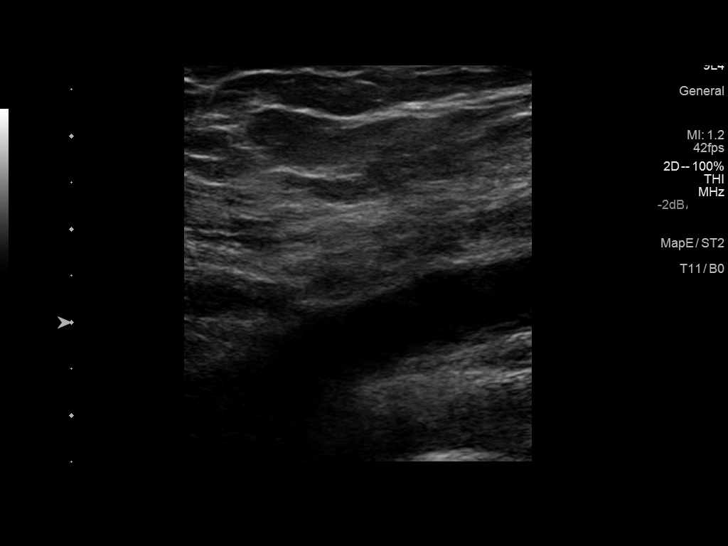
[im 6/35]
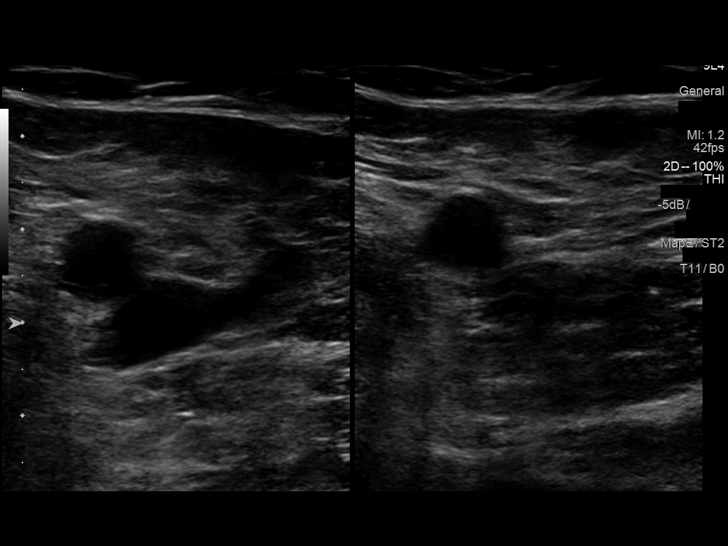
[im 9/35]
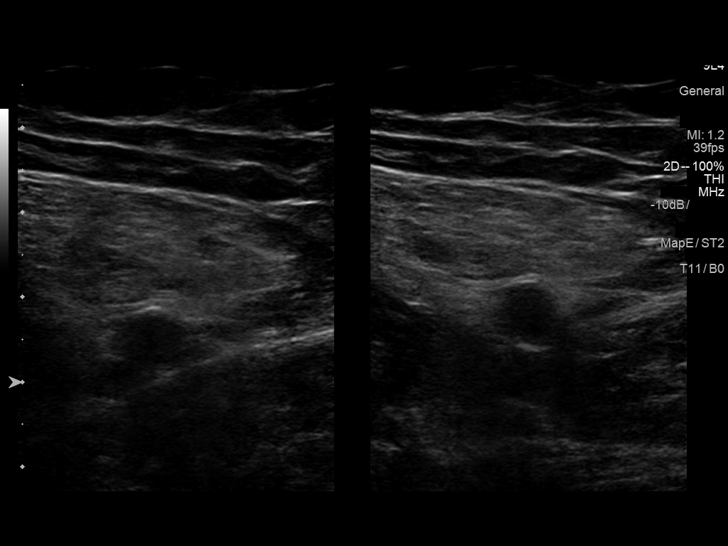
[im 12/35]
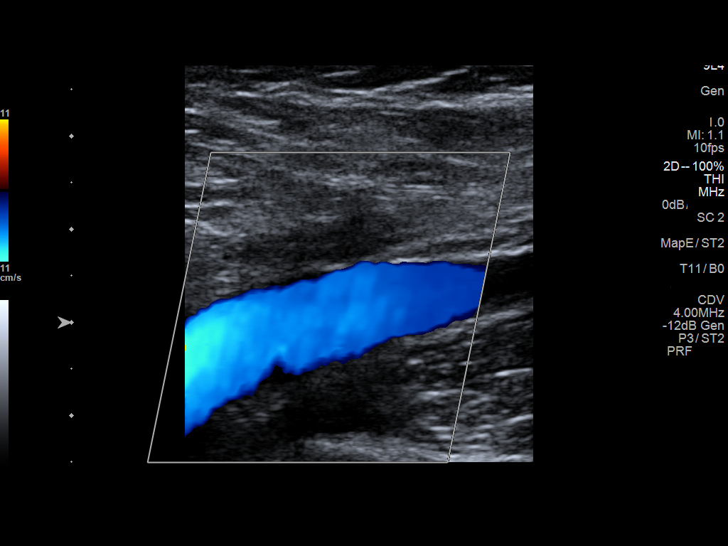
[im 15/35]
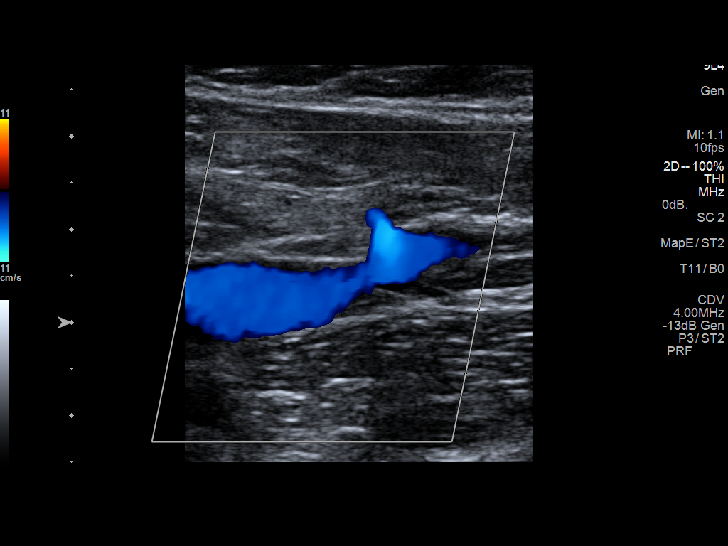
[im 18/35]
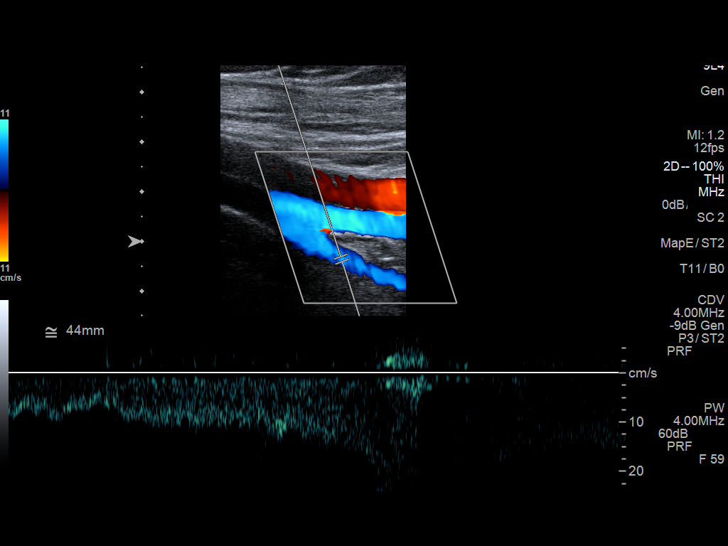
[im 20/35]
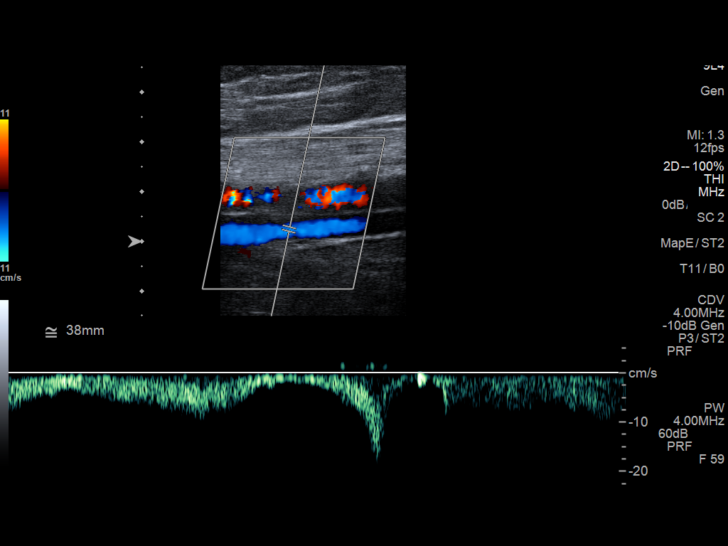
[im 23/35]
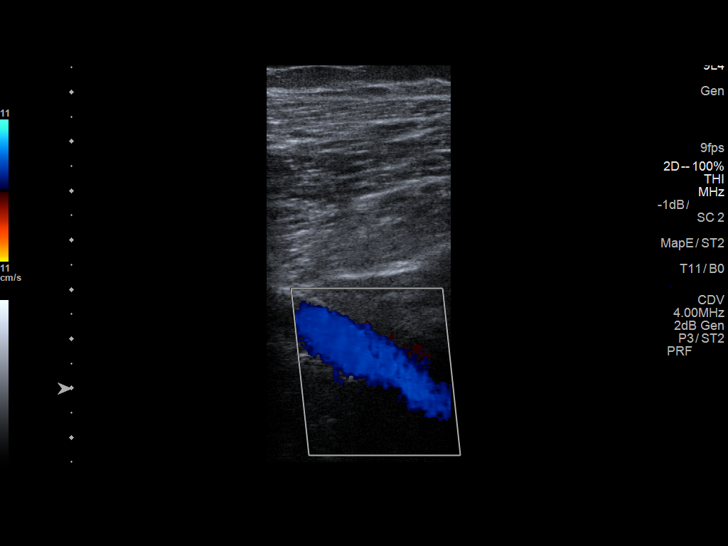
[im 26/35]
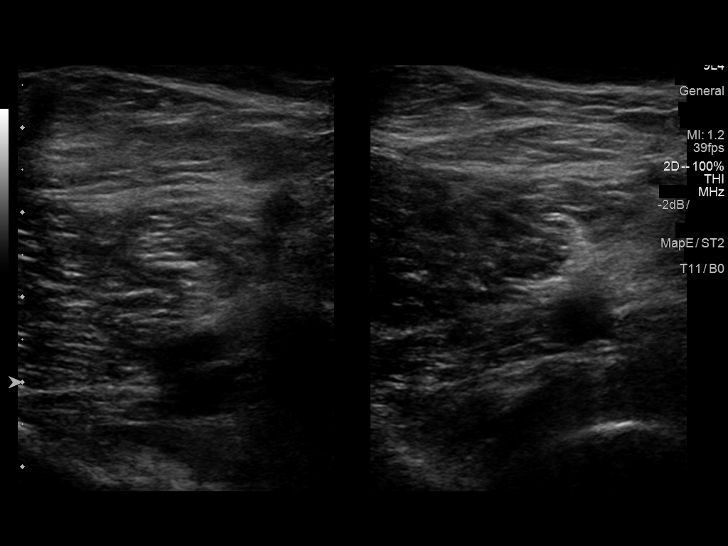
[im 29/35]
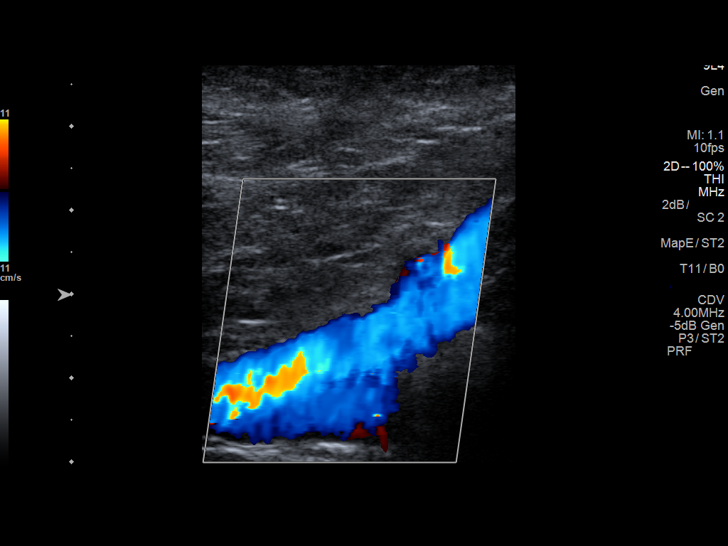
[im 32/35]
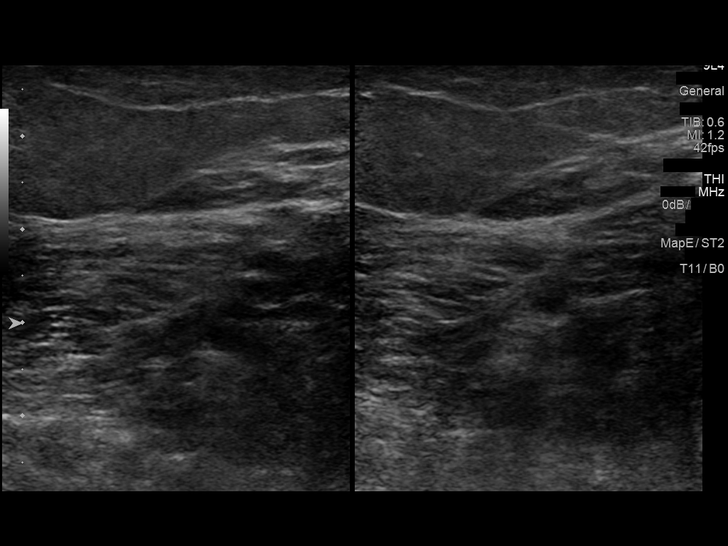
[im 35/35]
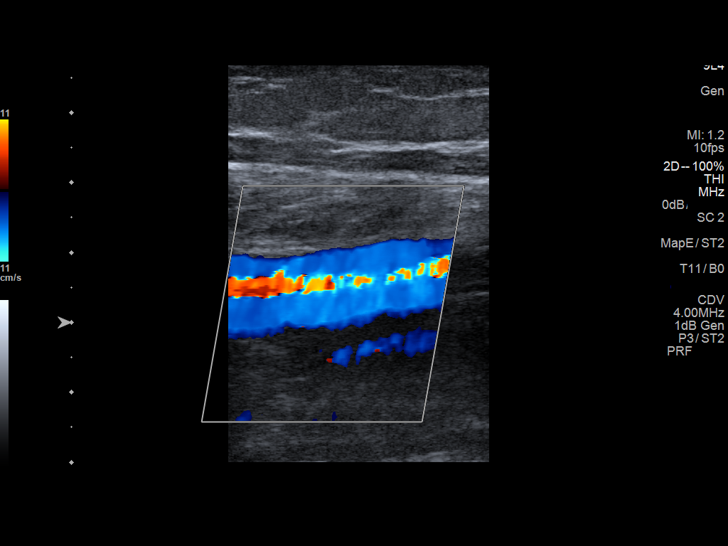

[13 of 24 positions shown; findings below may reference images not displayed]

FINDINGS: Contralateral Common Femoral Vein: Respiratory phasicity is normal
and symmetric with the symptomatic side. No evidence of thrombus.
Normal compressibility.

Common Femoral Vein: No evidence of thrombus. Normal
compressibility, respiratory phasicity and response to augmentation.

Saphenofemoral Junction: No evidence of thrombus. Normal
compressibility and flow on color Doppler imaging.

Profunda Femoral Vein: No evidence of thrombus. Normal
compressibility and flow on color Doppler imaging.

Femoral Vein: No evidence of thrombus. Normal compressibility,
respiratory phasicity and response to augmentation.

Popliteal Vein: No evidence of thrombus. Normal compressibility,
respiratory phasicity and response to augmentation.

Calf Veins: No evidence of thrombus. Normal compressibility and flow
on color Doppler imaging.

Superficial Great Saphenous Vein: No evidence of thrombus. Normal
compressibility and flow on color Doppler imaging.

Venous Reflux:  None.

Other Findings: No evidence of superficial thrombophlebitis or
abnormal fluid collection.
IMPRESSION: No evidence of right lower extremity deep venous thrombosis.

## 2015-01-11 ENCOUNTER — Encounter: Payer: Self-pay | Admitting: *Deleted

## 2015-01-11 DIAGNOSIS — Z3202 Encounter for pregnancy test, result negative: Secondary | ICD-10-CM | POA: Insufficient documentation

## 2015-01-11 DIAGNOSIS — Z720 Tobacco use: Secondary | ICD-10-CM | POA: Insufficient documentation

## 2015-01-11 DIAGNOSIS — R040 Epistaxis: Secondary | ICD-10-CM | POA: Diagnosis not present

## 2015-01-11 DIAGNOSIS — K529 Noninfective gastroenteritis and colitis, unspecified: Secondary | ICD-10-CM | POA: Diagnosis not present

## 2015-01-11 DIAGNOSIS — I10 Essential (primary) hypertension: Secondary | ICD-10-CM | POA: Diagnosis not present

## 2015-01-11 DIAGNOSIS — Z793 Long term (current) use of hormonal contraceptives: Secondary | ICD-10-CM | POA: Diagnosis not present

## 2015-01-11 DIAGNOSIS — Z79899 Other long term (current) drug therapy: Secondary | ICD-10-CM | POA: Insufficient documentation

## 2015-01-11 DIAGNOSIS — R109 Unspecified abdominal pain: Secondary | ICD-10-CM | POA: Diagnosis present

## 2015-01-11 NOTE — ED Notes (Signed)
Pt presents to ED with vomiting and nose bleed tonight. Reports generalized abd pain the past few days.

## 2015-01-11 NOTE — ED Notes (Signed)
Pt reports she has abd pain with vomiting and diarrhea x 2 days..  Also pt has nosebleed tonight .  No bleeding now.  Sx began at 2200 tonight while driving to work.

## 2015-01-12 ENCOUNTER — Emergency Department
Admission: EM | Admit: 2015-01-12 | Discharge: 2015-01-12 | Disposition: A | Discharge status: Home / Self Care | Payer: 59 | Attending: Emergency Medicine | Admitting: Emergency Medicine

## 2015-01-12 ENCOUNTER — Emergency Department: Payer: 59

## 2015-01-12 DIAGNOSIS — K529 Noninfective gastroenteritis and colitis, unspecified: Secondary | ICD-10-CM

## 2015-01-12 LAB — COMPREHENSIVE METABOLIC PANEL
ALT: 32 U/L (ref 14–54)
ANION GAP: 10 (ref 5–15)
AST: 30 U/L (ref 15–41)
Albumin: 4 g/dL (ref 3.5–5.0)
Alkaline Phosphatase: 71 U/L (ref 38–126)
BUN: 11 mg/dL (ref 6–20)
CALCIUM: 9.5 mg/dL (ref 8.9–10.3)
CHLORIDE: 105 mmol/L (ref 101–111)
CO2: 21 mmol/L — ABNORMAL LOW (ref 22–32)
CREATININE: 0.64 mg/dL (ref 0.44–1.00)
GLUCOSE: 97 mg/dL (ref 65–99)
Potassium: 3.2 mmol/L — ABNORMAL LOW (ref 3.5–5.1)
SODIUM: 136 mmol/L (ref 135–145)
TOTAL PROTEIN: 7.2 g/dL (ref 6.5–8.1)
Total Bilirubin: 0.5 mg/dL (ref 0.3–1.2)

## 2015-01-12 LAB — URINALYSIS COMPLETE WITH MICROSCOPIC (ARMC ONLY)
Bacteria, UA: NONE SEEN
Bilirubin Urine: NEGATIVE
GLUCOSE, UA: NEGATIVE mg/dL
Hgb urine dipstick: NEGATIVE
KETONES UR: NEGATIVE mg/dL
Nitrite: NEGATIVE
PROTEIN: NEGATIVE mg/dL
Specific Gravity, Urine: 1.006 (ref 1.005–1.030)
pH: 6 (ref 5.0–8.0)

## 2015-01-12 LAB — CBC WITH DIFFERENTIAL/PLATELET
BASOS ABS: 0 10*3/uL (ref 0–0.1)
BASOS PCT: 0 %
EOS PCT: 1 %
Eosinophils Absolute: 0.1 10*3/uL (ref 0–0.7)
HEMATOCRIT: 40.1 % (ref 35.0–47.0)
Hemoglobin: 13.9 g/dL (ref 12.0–16.0)
LYMPHS ABS: 1.4 10*3/uL (ref 1.0–3.6)
Lymphocytes Relative: 24 %
MCH: 32.2 pg (ref 26.0–34.0)
MCHC: 34.8 g/dL (ref 32.0–36.0)
MCV: 92.7 fL (ref 80.0–100.0)
MONO ABS: 0.5 10*3/uL (ref 0.2–0.9)
Monocytes Relative: 9 %
Neutro Abs: 3.9 10*3/uL (ref 1.4–6.5)
Neutrophils Relative %: 66 %
Platelets: 187 10*3/uL (ref 150–440)
RBC: 4.32 MIL/uL (ref 3.80–5.20)
RDW: 13.7 % (ref 11.5–14.5)
WBC: 5.9 10*3/uL (ref 3.6–11.0)

## 2015-01-12 LAB — LIPASE, BLOOD: Lipase: 33 U/L (ref 22–51)

## 2015-01-12 LAB — POCT PREGNANCY, URINE: Preg Test, Ur: NEGATIVE

## 2015-01-12 NOTE — Discharge Instructions (Signed)

## 2015-01-12 NOTE — ED Provider Notes (Signed)
[facility] Emergency Department Provider Note  ____________________________________________  Time seen: 2:30 AM  I have reviewed the triage vital signs and the nursing notes.   HISTORY  Chief Complaint Abdominal Pain and Epistaxis     HPI [name] is a 34 y.o. female presents with generalized abdominal pain and vomiting 2 days. Patient denies any fever. Patient does not appreciate any correlation with the discomfort and vomiting with eating denies any aggravating or alleviating factors.     Past Medical History  Diagnosis Date  . Hypertension   . Migraine   . Gestational diabetes     with 1st pregnancy  . Urinary tract infection     Patient Active Problem List   Diagnosis Date Noted  . HTN (hypertension), benign 02/13/2011    Past Surgical History  Procedure Laterality Date  . Tonsillectomy  1988  . Tubes in ears  Baby  . Dilation and evacuation  03/09/2011    Procedure: DILATATION AND EVACUATION (D&E);  Surgeon: Donnamae Jude, MD;  Location: [facility];  Service: Gynecology;  Laterality: N/A;  . Cholecystectomy N/A 11/2013    Current Outpatient Rx  Name  Route  Sig  Dispense  Refill  . acetaminophen (TYLENOL) 500 MG tablet   Oral   Take 1,000 mg by mouth every 6 (six) hours as needed for pain.         Marland Kitchen atenolol (TENORMIN) 50 MG tablet      TAKE 1 TABLET BY MOUTH EVERY DAY   30 tablet   3   . hydrochlorothiazide (HYDRODIURIL) 25 MG tablet   Oral   Take 1 tablet (25 mg total) by mouth daily.   30 tablet   3   . ibuprofen (ADVIL,MOTRIN) 600 MG tablet   Oral   Take 1 tablet (600 mg total) by mouth every 6 (six) hours.   30 tablet   0   . levonorgestrel (MIRENA) 20 MCG/24HR IUD   Intrauterine   by Intrauterine route.           Allergies Review of patient's allergies indicates no known allergies.  Family History  Problem Relation Age of Onset  . Diabetes Mother   . Osteoarthritis Mother   . Hypertension  Mother   . Hypothyroidism Mother   . Heart disease Mother   . Hyperlipidemia Mother   . Migraines Mother   . Mental illness Mother     Depression  . Hypertension Father   . Heart failure Father   . Hepatitis Father   . Cirrhosis Father   . Hyperlipidemia Father   . Migraines Father   . Mental illness Father     Depression  . Breast cancer Maternal Aunt 70  . Cancer Sister 59    Cervical  . Heart disease Sister     Heart Stops  . Migraines Sister   . Seizures Sister 11    unsure if this or heart problem  . Mental illness Sister     depression  . Heart disease Maternal Grandmother     Social History History  Substance Use Topics  . Smoking status: Current Some Day Smoker -- 0.50 packs/day for 12 years    Types: Cigarettes  . Smokeless tobacco: Never Used  . Alcohol Use: 0.5 oz/week    1 Standard drinks or equivalent per week    Review of Systems  Constitutional: Negative for fever. Eyes: Negative for visual changes. ENT: Negative for sore throat. Cardiovascular: Negative for chest pain.  Respiratory: Negative for shortness of breath. Gastrointestinal: Positive for abdominal pain, vomiting and diarrhea. Genitourinary: Negative for dysuria. Musculoskeletal: Negative for back pain. Skin: Negative for rash. Neurological: Negative for headaches, focal weakness or numbness.   10-point ROS otherwise negative.  ____________________________________________   PHYSICAL EXAM:  VITAL SIGNS: ED Triage Vitals  Enc Vitals Group     BP 01/11/15 2328 150/96 mmHg     Pulse Rate 01/12/15 0314 72     Resp 01/11/15 2328 20     Temp 01/11/15 2328 98.4 F (36.9 C)     Temp Source 01/11/15 2328 Oral     SpO2 01/11/15 2328 98 %     Weight 01/11/15 2328 90 lb (40.824 kg)     Height 01/11/15 2328 5\' 4"  (1.626 m)     Head Cir --      Peak Flow --      Pain Score 01/11/15 2329 5     Pain Loc --      Pain Edu? --      Excl. in [place]? --     Constitutional: Alert and  oriented. Well appearing and in no distress. Eyes: Conjunctivae are normal. PERRL. Normal extraocular movements. ENT   Head: Normocephalic and atraumatic.   Nose: No congestion/rhinnorhea.   Mouth/Throat: Mucous membranes are moist.   Neck: No stridor. Cardiovascular: Normal rate, regular rhythm. Normal and symmetric distal pulses are present in all extremities. No murmurs, rubs, or gallops. Respiratory: Normal respiratory effort without tachypnea nor retractions. Breath sounds are clear and equal bilaterally. No wheezes/rales/rhonchi. Gastrointestinal: Positive right upper quadrant pain with palpation. No distention. There is no CVA tenderness. Genitourinary: deferred Musculoskeletal: Nontender with normal range of motion in all extremities. No joint effusions.  No lower extremity tenderness nor edema. Neurologic:  Normal speech and language. No gross focal neurologic deficits are appreciated. Speech is normal.  Skin:  Skin is warm, dry and intact. No rash noted. Psychiatric: Mood and affect are normal. Speech and behavior are normal. Patient exhibits appropriate insight and judgment.  ____________________________________________    LABS (pertinent positives/negatives)  Labs Reviewed  COMPREHENSIVE METABOLIC PANEL - Abnormal; Notable for the following:    Potassium 3.2 (*)    CO2 21 (*)    All other components within normal limits  URINALYSIS COMPLETEWITH MICROSCOPIC ([facility] ONLY) - Abnormal; Notable for the following:    Color, Urine YELLOW (*)    APPearance CLEAR (*)    Leukocytes, UA 1+ (*)    Squamous Epithelial / LPF 0-5 (*)    All other components within normal limits  CBC WITH DIFFERENTIAL/PLATELET  LIPASE, BLOOD  POC URINE PREG, ED  POCT PREGNANCY, URINE         RADIOLOGY  US abdomen: negative  ____________________________________________    INITIAL IMPRESSION / ASSESSMENT AND PLAN / ED COURSE  Pertinent labs & imaging results that were  available during my care of the patient were reviewed by me and considered in my medical decision making (see chart for details).    ____________________________________________   FINAL CLINICAL IMPRESSION(S) / ED DIAGNOSES  Final diagnoses:  Acute gastroenteritis      Gregor Hams, MD 01/14/15 [phone]

## 2015-03-15 ENCOUNTER — Ambulatory Visit
Admission: EM | Admit: 2015-03-15 | Discharge: 2015-03-15 | Disposition: A | Discharge status: Home / Self Care | Payer: 59 | Attending: Family Medicine | Admitting: Family Medicine

## 2015-03-15 ENCOUNTER — Ambulatory Visit
Admit: 2015-03-15 | Discharge: 2015-03-15 | Disposition: A | Discharge status: Home / Self Care | Payer: 59 | Attending: Family Medicine | Admitting: Family Medicine

## 2015-03-15 ENCOUNTER — Encounter: Payer: Self-pay | Admitting: Emergency Medicine

## 2015-03-15 ENCOUNTER — Ambulatory Visit: Payer: 59

## 2015-03-15 DIAGNOSIS — R52 Pain, unspecified: Secondary | ICD-10-CM

## 2015-03-15 DIAGNOSIS — M79651 Pain in right thigh: Secondary | ICD-10-CM

## 2015-03-15 MED ORDER — KETOROLAC TROMETHAMINE 10 MG PO TABS: 10.0000 mg | ORAL_TABLET | Freq: Three times a day (TID) | ORAL | Status: DC | PRN

## 2015-03-15 NOTE — ED Provider Notes (Signed)
CSN: [id number]     Arrival date & time 03/15/15  1105 History   First MD Initiated Contact with Patient 03/15/15 1222     Chief Complaint  Patient presents with  . Leg Pain   (Consider location/radiation/quality/duration/timing/severity/associated sxs/prior Treatment) HPI Comments: 34 yo female with a 1 week h/o progressively worsening right thigh pain. Denies any injuries, falls, trauma, rash, fevers, chills, recent surgery, prolonged immobilization, chest pain, shortness of breath or back pain. States pain is constant but worse when standing or walking. States pain "feels deep in the muscle or bone".   Patient is a 34 y.o. female presenting with leg pain. The history is provided by the patient.  Leg Pain   Past Medical History  Diagnosis Date  . Hypertension   . Migraine   . Gestational diabetes     with 1st pregnancy  . Urinary tract infection    Past Surgical History  Procedure Laterality Date  . Tonsillectomy  1988  . Tubes in ears  Baby  . Dilation and evacuation  03/09/2011    Procedure: DILATATION AND EVACUATION (D&E);  Surgeon: Donnamae Jude, MD;  Location: [facility];  Service: Gynecology;  Laterality: N/A;  . Cholecystectomy N/A 11/2013   Family History  Problem Relation Age of Onset  . Diabetes Mother   . Osteoarthritis Mother   . Hypertension Mother   . Hypothyroidism Mother   . Heart disease Mother   . Hyperlipidemia Mother   . Migraines Mother   . Mental illness Mother     Depression  . Hypertension Father   . Heart failure Father   . Hepatitis Father   . Cirrhosis Father   . Hyperlipidemia Father   . Migraines Father   . Mental illness Father     Depression  . Breast cancer Maternal Aunt 70  . Cancer Sister 51    Cervical  . Heart disease Sister     Heart Stops  . Migraines Sister   . Seizures Sister 11    unsure if this or heart problem  . Mental illness Sister     depression  . Heart disease Maternal Grandmother    Social History  Substance  Use Topics  . Smoking status: Current Some Day Smoker -- 0.50 packs/day for 12 years    Types: Cigarettes  . Smokeless tobacco: Never Used  . Alcohol Use: 0.5 oz/week    1 Standard drinks or equivalent per week   OB History    Gravida Para Term Preterm AB TAB SAB Ectopic Multiple Living   6 3 3  0 3 2 1  0 0 3     Review of Systems  Allergies  Review of patient's allergies indicates no known allergies.  Home Medications   Prior to Admission medications   Medication Sig Start Date End Date Taking? Authorizing Provider  acetaminophen (TYLENOL) 500 MG tablet Take 1,000 mg by mouth every 6 (six) hours as needed for pain.    Historical Provider, MD  atenolol (TENORMIN) 50 MG tablet TAKE 1 TABLET BY MOUTH EVERY DAY 01/28/14   Donnamae Jude, MD  hydrochlorothiazide (HYDRODIURIL) 25 MG tablet Take 1 tablet (25 mg total) by mouth daily. 01/28/14   Donnamae Jude, MD  ibuprofen (ADVIL,MOTRIN) 600 MG tablet Take 1 tablet (600 mg total) by mouth every 6 (six) hours. 03/02/13   Seabron Spates, CNM  ketorolac (TORADOL) 10 MG tablet Take 1 tablet (10 mg total) by mouth every 8 (eight) hours as needed. 03/15/15  Norval Gable, MD  levonorgestrel (MIRENA) 20 MCG/24HR IUD by Intrauterine route.    Historical Provider, MD   Meds Ordered and Administered this Visit  Medications - No data to display  BP 130/91 mmHg  Pulse 77  Temp(Src) 98.9 F (37.2 C) (Tympanic)  Resp 20  Ht 5\' 4"  (1.626 m)  Wt 190 lb (86.183 kg)  BMI 32.60 kg/m2  SpO2 99% No data found.   Physical Exam  Constitutional: She appears well-developed and well-nourished. No distress.  Cardiovascular: Normal rate.   Pulmonary/Chest: Effort normal. No respiratory distress.  Musculoskeletal: She exhibits tenderness. She exhibits no edema.       Right upper leg: She exhibits tenderness (mild to moderate tenderness to palpation of anterior right thigh; no rash, edema or erythema). She exhibits no bony tenderness, no swelling, no edema,  no deformity and no laceration.  Right lower extremity neurovascularly intact  Skin: No rash noted. She is not diaphoretic.  Nursing note and vitals reviewed.   ED Course  Procedures (including critical care time)  Labs Review Labs Reviewed - No data to display  Imaging Review Dg Femur, Min 2 Views Right  03/15/2015   CLINICAL DATA:  Pain in the mid thigh for 1 week, no injury  EXAM: RIGHT FEMUR 2 VIEWS  COMPARISON:  None.  FINDINGS: No acute abnormality is seen. Alignment is normal. The right hip joint space appears normal and what is seen of the right knee joint space also appears normal.  IMPRESSION: Negative.   Electronically Signed   By: Ivar Drape M.D.   On: 03/15/2015 13:43     Visual Acuity Review  Right Eye Distance:   Left Eye Distance:   Bilateral Distance:    Right Eye Near:   Left Eye Near:    Bilateral Near:         MDM   1. Right thigh pain   2. Pain   (likely muscular) New Prescriptions   KETOROLAC (TORADOL) 10 MG TABLET    Take 1 tablet (10 mg total) by mouth every 8 (eight) hours as needed.   Plan: 1. Test/x-ray results and diagnosis reviewed with patient 2. Recommend supportive treatment with rest, heat/ice 3. Check venous doppler LE to rule out DVT 4. F/u prn if symptoms worsen or don't improve    Norval Gable, MD 03/15/15 1443

## 2015-03-15 NOTE — ED Notes (Signed)
Pt with right thigh pain x 1 week

## 2015-03-21 ENCOUNTER — Other Ambulatory Visit: Payer: 59

## 2015-03-25 ENCOUNTER — Encounter: Payer: Self-pay | Admitting: Emergency Medicine

## 2015-03-25 ENCOUNTER — Ambulatory Visit
Admission: EM | Admit: 2015-03-25 | Discharge: 2015-03-25 | Disposition: A | Discharge status: Home / Self Care | Payer: 59 | Attending: Family Medicine | Admitting: Family Medicine

## 2015-03-25 DIAGNOSIS — H6593 Unspecified nonsuppurative otitis media, bilateral: Secondary | ICD-10-CM

## 2015-03-25 DIAGNOSIS — H6982 Other specified disorders of Eustachian tube, left ear: Secondary | ICD-10-CM | POA: Diagnosis not present

## 2015-03-25 DIAGNOSIS — H9202 Otalgia, left ear: Secondary | ICD-10-CM | POA: Diagnosis not present

## 2015-03-25 DIAGNOSIS — R42 Dizziness and giddiness: Secondary | ICD-10-CM

## 2015-03-25 DIAGNOSIS — H6992 Unspecified Eustachian tube disorder, left ear: Secondary | ICD-10-CM

## 2015-03-25 DIAGNOSIS — R599 Enlarged lymph nodes, unspecified: Secondary | ICD-10-CM | POA: Diagnosis not present

## 2015-03-25 DIAGNOSIS — R590 Localized enlarged lymph nodes: Secondary | ICD-10-CM

## 2015-03-25 LAB — CBC WITH DIFFERENTIAL/PLATELET
BASOS ABS: 0.1 10*3/uL (ref 0–0.1)
Basophils Relative: 1 %
EOS ABS: 0.1 10*3/uL (ref 0–0.7)
EOS PCT: 1 %
HCT: 41.6 % (ref 35.0–47.0)
Hemoglobin: 14.5 g/dL (ref 12.0–16.0)
LYMPHS PCT: 27 %
Lymphs Abs: 2.8 10*3/uL (ref 1.0–3.6)
MCH: 31.7 pg (ref 26.0–34.0)
MCHC: 34.9 g/dL (ref 32.0–36.0)
MCV: 91 fL (ref 80.0–100.0)
Monocytes Absolute: 0.7 10*3/uL (ref 0.2–0.9)
Monocytes Relative: 7 %
Neutro Abs: 6.6 10*3/uL — ABNORMAL HIGH (ref 1.4–6.5)
Neutrophils Relative %: 64 %
PLATELETS: 242 10*3/uL (ref 150–440)
RBC: 4.57 MIL/uL (ref 3.80–5.20)
RDW: 13.9 % (ref 11.5–14.5)
WBC: 10.3 10*3/uL (ref 3.6–11.0)

## 2015-03-25 LAB — RAPID STREP SCREEN (MED CTR MEBANE ONLY): STREPTOCOCCUS, GROUP A SCREEN (DIRECT): NEGATIVE

## 2015-03-25 MED ORDER — MECLIZINE HCL 25 MG PO TABS: 25.0000 mg | ORAL_TABLET | Freq: Three times a day (TID) | ORAL | Status: DC | PRN

## 2015-03-25 MED ORDER — CEFIXIME 400 MG PO CAPS: 400.0000 mg | ORAL_CAPSULE | Freq: Every day | ORAL | Status: DC

## 2015-03-25 NOTE — ED Notes (Signed)
Pt with a knot behind left ear

## 2015-03-25 NOTE — ED Provider Notes (Addendum)
CSN: [id number]     Arrival date & time 03/25/15  1019 History   First MD Initiated Contact with Patient 03/25/15 1050     Chief Complaint  Patient presents with  . Otalgia   (Consider location/radiation/quality/duration/timing/severity/associated sxs/prior Treatment) HPI Comments: Married caucasian female here for evaluation of tenderness and swelling behind left ear, feeling dizzy with position changes, headache, knot on top of head.  Cold symptoms one week ago.  Denied seasonal allergies.  Has felt hot but has not taken temperature.  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   Hasn't felt good.  Works nights.  Left side of neck felt tight yesterday and ear hurting more.  Nausea.  The history is provided by the patient.    Past Medical History  Diagnosis Date  . Hypertension   . Migraine   . Gestational diabetes     with 1st pregnancy  . Urinary tract infection    Past Surgical History  Procedure Laterality Date  . Tonsillectomy  1988  . Tubes in ears  Baby  . Dilation and evacuation  03/09/2011    Procedure: DILATATION AND EVACUATION (D&E);  Surgeon: Donnamae Jude, MD;  Location: [facility];  Service: Gynecology;  Laterality: N/A;  . Cholecystectomy N/A 11/2013   Family History  Problem Relation Age of Onset  . Diabetes Mother   . Osteoarthritis Mother   . Hypertension Mother   . Hypothyroidism Mother   . Heart disease Mother   . Hyperlipidemia Mother   . Migraines Mother   . Mental illness Mother     Depression  . Hypertension Father   . Heart failure Father   . Hepatitis Father   . Cirrhosis Father   . Hyperlipidemia Father   . Migraines Father   . Mental illness Father     Depression  . Breast cancer Maternal Aunt 70  . Cancer Sister 88    Cervical  . Heart disease Sister     Heart Stops  . Migraines Sister   . Seizures Sister 11    unsure if this or heart problem  . Mental illness Sister    depression  . Heart disease Maternal Grandmother    Social History  Substance Use Topics  . Smoking status: Current Some Day Smoker -- 0.50 packs/day for 12 years    Types: Cigarettes  . Smokeless tobacco: Never Used  . Alcohol Use: 0.5 oz/week    1 Standard drinks or equivalent per week   OB History    Gravida Para Term Preterm AB TAB SAB Ectopic Multiple Living   6 3 3  0 3 2 1  0 0 3     Review of Systems  Constitutional: Positive for fever. Negative for chills, diaphoresis, activity change, appetite change, fatigue and unexpected weight change.  HENT: Positive for ear pain. Negative for congestion, dental problem, drooling, ear discharge, facial swelling, hearing loss, mouth sores, nosebleeds, postnasal drip, rhinorrhea, sinus pressure, sneezing, sore throat, tinnitus, trouble swallowing and voice change.   Eyes: Negative for photophobia, pain, discharge, redness, itching and visual disturbance.  Respiratory: Negative for cough, shortness of breath, wheezing and stridor.   Cardiovascular: Negative for chest pain and leg swelling.  Gastrointestinal: Positive for nausea. Negative for vomiting, abdominal pain, diarrhea, constipation, blood in stool and abdominal distention.  Endocrine: Negative for cold intolerance and heat intolerance.  Genitourinary: Negative for dysuria.  Musculoskeletal: Negative for myalgias, back pain, joint swelling, arthralgias, gait problem,  neck pain and neck stiffness.  Skin: Negative for color change, pallor, rash and wound.  Allergic/Immunologic: Negative for environmental allergies and food allergies.  Neurological: Positive for dizziness and headaches. Negative for tremors, seizures, syncope, facial asymmetry, speech difficulty, weakness, light-headedness and numbness.  Hematological: Positive for adenopathy. Does not bruise/bleed easily.  Psychiatric/Behavioral: Negative for behavioral problems, confusion, sleep disturbance and agitation.    Allergies    Review of patient's allergies indicates no known allergies.  Home Medications   Prior to Admission medications   Medication Sig Start Date End Date Taking? Authorizing Provider  acetaminophen (TYLENOL) 500 MG tablet Take 1,000 mg by mouth every 6 (six) hours as needed for pain.    Historical Provider, MD  atenolol (TENORMIN) 50 MG tablet TAKE 1 TABLET BY MOUTH EVERY DAY 01/28/14   Donnamae Jude, MD  Cefixime (SUPRAX) 400 MG CAPS capsule Take 1 capsule (400 mg total) by mouth daily. 03/25/15 04/22/15  Olen Cordial, NP  hydrochlorothiazide (HYDRODIURIL) 25 MG tablet Take 1 tablet (25 mg total) by mouth daily. 01/28/14   Donnamae Jude, MD  ibuprofen (ADVIL,MOTRIN) 600 MG tablet Take 1 tablet (600 mg total) by mouth every 6 (six) hours. 03/02/13   Seabron Spates, CNM  levonorgestrel (MIRENA) 20 MCG/24HR IUD by Intrauterine route.    Historical Provider, MD  meclizine (ANTIVERT) 25 MG tablet Take 1 tablet (25 mg total) by mouth 3 (three) times daily as needed for dizziness. 03/25/15   Olen Cordial, NP   Meds Ordered and Administered this Visit  Medications - No data to display  BP 131/83 mmHg  Pulse 68  Temp(Src) 98.4 F (36.9 C) (Tympanic)  Resp 18  Ht 5\' 4"  (1.626 m)  Wt 192 lb (87.091 kg)  BMI 32.94 kg/m2  SpO2 100% Orthostatic VS for the past 24 hrs:  BP- Lying Pulse- Lying BP- Sitting Pulse- Sitting BP- Standing at 0 minutes Pulse- Standing at 0 minutes  03/25/15 1119 112/76 mmHg 61 114/81 mmHg 62 117/81 mmHg 65  03/25/15 1117 112/76 mmHg 63 - - - -    Physical Exam  Constitutional: She is oriented to person, place, and time. Vital signs are normal. She appears well-developed and well-nourished. No distress.  HENT:  Head: Normocephalic and atraumatic.  Right Ear: Hearing, external ear and ear canal normal. A middle ear effusion is present.  Left Ear: Hearing and ear canal normal. There is swelling and tenderness. A middle ear effusion is present.  Ears:  Nose: Nose  normal. No mucosal edema, rhinorrhea, nose lacerations, sinus tenderness, nasal deformity, septal deviation or nasal septal hematoma. No epistaxis.  No foreign bodies. Right sinus exhibits no maxillary sinus tenderness and no frontal sinus tenderness. Left sinus exhibits no maxillary sinus tenderness and no frontal sinus tenderness.  Mouth/Throat: Uvula is midline and mucous membranes are normal. Mucous membranes are not pale, not dry and not cyanotic. She does not have dentures. No oral lesions. No trismus in the jaw. Normal dentition. No dental abscesses, uvula swelling, lacerations or dental caries. Posterior oropharyngeal edema and posterior oropharyngeal erythema present. No oropharyngeal exudate or tonsillar abscesses.  Cobblestoning posterior pharynx; bilateral TMs with air fluid level slight opacity; oropharynx with macular erythematous rash  Eyes: Conjunctivae, EOM and lids are normal. Pupils are equal, round, and reactive to light. Right eye exhibits no discharge. Left eye exhibits no discharge. No scleral icterus.  Neck: Trachea normal and normal range of motion. Neck supple. No spinous process tenderness and no muscular  tenderness present. No rigidity. No tracheal deviation, no edema, no erythema and normal range of motion present. No thyroid mass and no thyromegaly present.  Cardiovascular: Normal rate, regular rhythm, normal heart sounds and intact distal pulses.  Exam reveals no gallop and no friction rub.   No murmur heard. Pulmonary/Chest: Effort normal and breath sounds normal. No accessory muscle usage or stridor. No respiratory distress. She has no decreased breath sounds. She has no wheezes. She has no rhonchi. She has no rales. She exhibits no tenderness.  Abdominal: Soft. Bowel sounds are normal. She exhibits no distension and no mass. There is no tenderness. There is no rebound and no guarding.  Musculoskeletal: Normal range of motion. She exhibits no edema or tenderness.    Lymphadenopathy:       Head (right side): No submental, no submandibular, no tonsillar, no preauricular, no posterior auricular and no occipital adenopathy present.       Head (left side): Posterior auricular adenopathy present. No submental, no submandibular, no tonsillar, no preauricular and no occipital adenopathy present.    She has no cervical adenopathy.       Right cervical: No superficial cervical, no deep cervical and no posterior cervical adenopathy present.      Left cervical: No superficial cervical, no deep cervical and no posterior cervical adenopathy present.       Left: No supraclavicular adenopathy present.  Neurological: She is alert and oriented to person, place, and time. No cranial nerve deficit. She exhibits normal muscle tone. Coordination normal.  Skin: Skin is warm, dry and intact. No abrasion, no bruising, no burn, no ecchymosis, no laceration, no lesion, no petechiae and no rash noted. She is not diaphoretic. No cyanosis or erythema. No pallor. Nails show no clubbing.  Psychiatric: She has a normal mood and affect. Her speech is normal and behavior is normal. Judgment and thought content normal. Cognition and memory are normal.  Nursing note and vitals reviewed.   ED Course  Procedures (including critical care time)  Labs Review Labs Reviewed  CBC WITH DIFFERENTIAL/PLATELET - Abnormal; Notable for the following:    Neutro Abs 6.6 (*)    All other components within normal limits  RAPID STREP SCREEN (NOT AT [facility])  CULTURE, GROUP A STREP ([facility] ONLY)    Imaging Review No results found.  1129 not orthostatic discussed with patient.  She preferred trial meclizine at home.  Paper Rx given to patient. Patient verbalized understanding of information/instructions and had no further questions at this time.  1150 discussed rapid strep negative with patient.  Throat culture pending 48 hours typically will call with results once available.  Discussed ear pain could be  early mastoiditis versus eustachian tube dysfunction versus lymphadenopathy pain or combination.  Lymphadenopathy can be caused by atenolol, virus, bacterial infection.  If swollen area worsening, red, hot, fever greater than 100.67F, erythema/swelling spreading to go to ER this weekend for re-evaluation as discussed mastoiditis sometimes requires IV antibiotics/draining.  Lymphadenopathy should resolve within 4 weeks and if not follow up with [facility] for re-evaluation.  Patient verbalized understanding of information/instructions, agreed with plan of care and had no further questions at this time.  MDM   1. Otitis media with effusion, bilateral   2. Lymphadenopathy, postauricular   3. Eustachian tube dysfunction, left   4. Pain behind the ear, left   5. Dizziness    Supportive treatment. Tylenol 1000mg  po QID prn.  Discussed motrin/naproxen counteracts blood pressure medication and prefer those as second line  pain medications if no relief with tylenol.  Patient has all at home.  Consider flonase 1 spray each nostril BID if nasal/post nasal symptoms.   No evidence of invasive bacterial infection, non toxic and well hydrated.  This is most likely self limiting viral infection.  I do not see where any further testing or imaging is necessary at this time.   I will suggest supportive care, rest, good hygiene and encourage the patient to take adequate fluids.  The patient is to return to clinic or EMERGENCY ROOM if symptoms worsen or change significantly e.g. ear pain, fever, purulent discharge from ears or bleeding.  Discussed eustachian tube dysfunction with patient also.  Exitcare handout on otitis media with effusion, mastoiditis, eustachian tube dysfunction given to patient.  Patient verbalized agreement and understanding of treatment plan.    Discussed with patient lymphadenopathy should resolve within 30 days and if chronic to follow up with [facility] for re-evaluation.  Exitcare handout on lymphadenopathy given  to patient.  Patient verbalized understanding of information/instructions, agreed with plan of care and had no further questions at this time.  Discussed with patient otitis media with effusion probably causing vertigo but could also be age.  Does not want meclizine trial in clinic preferred to take first dose at home.  Supportive treatment may take up to 4 doses meclizine per day max 100mg  per 24 hours.  recommended not driving during vertigo episodes or after taking meclizine until known if drowsiness or worsening/improving symptoms with use.  Follow up if aphasia, dysphasia, visual changes, weakness, fall, worst headache of life, incoordination, fever, ear discharge.  Consider ENT evaluation/follow up with PCM if worsening symptoms not controlled with meclizine or needs Rx refill.  Patient verbalized understanding of information/agreed with plan of care and had no further questions at this time.  Olen Cordial, NP 03/25/15 1236  28 Mar 2015 0830 Patient notified throat culture normal/negative.  Patient requested diflucan Rx as vaginal yeast common if she takes oral antibiotics be called into [facility] [place], [place] Diflucan 150mg  po x1 #1 Rf0.  Started suprax this weekend.  Patient reported she had not taken meclizine feeling a little better minimal dizziness.  Going to work today.  Patient verbalized understanding of information/instructions, agreed with plan of care and had no further questions at this time.  Olen Cordial, NP 03/28/15 [phone]

## 2015-03-25 NOTE — Discharge Instructions (Signed)
Mastoiditis Mastoiditis is an infection that has spread from the middle ear to a bony area (the mastoid air cells) behind the middle ear. It is an uncommon complication of a middle ear infection. It occurs most often in young children. Treatment with the right antibiotics (medications that are used to treat bacteria germs) is generally effective. With the right treatment, there is a very high chance of full recovery.  SYMPTOMS  Some common symptoms of mastoiditis include:   Pain, swelling, redness, warmth, or a tender mass of the bone behind the ear.  Fever.  Fussiness and irritability.  Redness and swelling of the ear lobe or ear.  Ear drainage.  Headache. DIAGNOSIS  Your caregiver will make the diagnosis based on an exam and on questions about what you or your child has been feeling. Other tests that may be done include:  Blood work.  Blood cultures or cultures of ear drainage.  X-rays.  If you or your child has symptoms that suggest more serious problems, additional tests and/or specialized x-rays may be done. These could include:  CT (CAT) scans.  Magnetic Resonance Imaging (MRI). TREATMENT  Treatment will be based on how serious the infection is and what will be expected to give the best outcome. The treatment required may be:  Hospitalization and antibiotics given through a vein.  An operation (myringotomy) is sometimes done to relieve the pressure from the middle ear. This is a surgical procedure where a small hole is cut into the ear drum. A small tube is then placed to keep the hole open and draining. The tubes usually fall out on their own after 6 to 12 months.  In rare cases, if the above treatments do not work, more surgery may be necessary. This is called a mastoidectomy. RISKS AND COMPLICATIONS These are rare if proper treatment is started early. These can include:  Facial paralysis  Infection and possible destruction of the mastoid bone.  Spread of  infection to the neck.  Hearing loss which can be partial or complete on the side of the infection.  Infection spread to the brain.  Clots or blockage of blood vessels in the neck or brain. HOME CARE INSTRUCTIONS   Take prescribed antibiotics and other medications as directed by your caregiver.  Follow-up with an exam by an ENT (Ear, Nose, and Throat) specialist if recommended.  A follow-up hearing test (audiogram) may be recommended. SEEK MEDICAL CARE IF:   You develop recurrent fevers of 100 F (37.8 C) or higher.  You develop new headache, ear, or facial pain that had not happened before.  You feel that there has been loss of hearing. SEEK IMMEDIATE MEDICAL CARE IF:   You develop fevers of 102 F (38.9 C).  You develop severe headache, ear, or facial pain.  You experience sudden hearing loss.  You develop repeated episodes of vomiting.  You develop weakness or drooping of one side of your face.  You experience weakness of one arm, one leg, or on one side of your body.  You develop sudden problems with speech and/or vision. MAKE SURE YOU:   Understand these instructions.  Will watch your condition.  Will get help right away if you are not doing well or get worse. Document Released: 07/18/2006 Document Revised: 09/10/2011 Document Reviewed: 06/06/2007 [facility] Patient Information 2015 [place], [place]. This information is not intended to replace advice given to you by your health care provider. Make sure you discuss any questions you have with your health care provider.  Swollen Lymph Nodes The lymphatic system filters fluid from around cells. It is like a system of blood vessels. These channels carry lymph instead of blood. The lymphatic system is an important part of the immune (disease fighting) system. When people talk about "swollen glands in the neck," they are usually talking about swollen lymph nodes. The lymph nodes are like the little traps for infection. You  and your caregiver may be able to feel lymph nodes, especially swollen nodes, in these common areas: the groin (inguinal area), armpits (axilla), and above the clavicle (supraclavicular). You may also feel them in the neck (cervical) and the back of the head just above the hairline (occipital). Swollen glands occur when there is any condition in which the body responds with an allergic type of reaction. For instance, the glands in the neck can become swollen from insect bites or any type of minor infection on the head. These are very noticeable in children with only minor problems. Lymph nodes may also become swollen when there is a tumor or problem with the lymphatic system, such as Hodgkin's disease. TREATMENT   Most swollen glands do not require treatment. They can be observed (watched) for a short period of time, if your caregiver feels it is necessary. Most of the time, observation is not necessary.  Antibiotics (medicines that kill germs) may be prescribed by your caregiver. Your caregiver may prescribe these if he or she feels the swollen glands are due to a bacterial (germ) infection. Antibiotics are not used if the swollen glands are caused by a virus. HOME CARE INSTRUCTIONS   Take medications as directed by your caregiver. Only take over-the-counter or prescription medicines for pain, discomfort, or fever as directed by your caregiver. SEEK MEDICAL CARE IF:   If you begin to run a temperature greater than 102 F (38.9 C), or as your caregiver suggests. MAKE SURE YOU:   Understand these instructions.  Will watch your condition.  Will get help right away if you are not doing well or get worse. Document Released: 06/08/2002 Document Revised: 09/10/2011 Document Reviewed: 06/18/2005 [facility] Patient Information 2015 [place], [place]. This information is not intended to replace advice given to you by your health care provider. Make sure you discuss any questions you have with your health  care provider. Otitis Media With Effusion Otitis media with effusion is the presence of fluid in the middle ear. This is a common problem in children, which often follows ear infections. It may be present for weeks or longer after the infection. Unlike an acute ear infection, otitis media with effusion refers only to fluid behind the ear drum and not infection. Children with repeated ear and sinus infections and allergy problems are the most likely to get otitis media with effusion. CAUSES  The most frequent cause of the fluid buildup is dysfunction of the eustachian tubes. These are the tubes that drain fluid in the ears to the back of the nose (nasopharynx). SYMPTOMS   The main symptom of this condition is hearing loss. As a result, you or your child may:  Listen to the TV at a loud volume.  Not respond to questions.  Ask "what" often when spoken to.  Mistake or confuse one sound or word for another.  There may be a sensation of fullness or pressure but usually not pain. DIAGNOSIS   Your health care provider will diagnose this condition by examining you or your child's ears.  Your health care provider may test the pressure in  you or your child's ear with a tympanometer.  A hearing test may be conducted if the problem persists. TREATMENT   Treatment depends on the duration and the effects of the effusion.  Antibiotics, decongestants, nose drops, and cortisone-type drugs (tablets or nasal spray) may not be helpful.  Children with persistent ear effusions may have delayed language or behavioral problems. Children at risk for developmental delays in hearing, learning, and speech may require referral to a specialist earlier than children not at risk.  You or your child's health care provider may suggest a referral to an ear, nose, and throat surgeon for treatment. The following may help restore normal hearing:  Drainage of fluid.  Placement of ear tubes (tympanostomy  tubes).  Removal of adenoids (adenoidectomy). HOME CARE INSTRUCTIONS   Avoid secondhand smoke.  Infants who are breastfed are less likely to have this condition.  Avoid feeding infants while they are lying flat.  Avoid known environmental allergens.  Avoid people who are sick. SEEK MEDICAL CARE IF:   Hearing is not better in 3 months.  Hearing is worse.  Ear pain.  Drainage from the ear.  Dizziness. MAKE SURE YOU:   Understand these instructions.  Will watch your condition.  Will get help right away if you are not doing well or get worse. Document Released: 07/26/2004 Document Revised: 11/02/2013 Document Reviewed: 01/13/2013 [facility] Patient Information 2015 [place], [place]. This information is not intended to replace advice given to you by your health care provider. Make sure you discuss any questions you have with your health care provider. Barotitis Media Barotitis media is inflammation of your middle ear. This occurs when the auditory tube (eustachian tube) leading from the back of your nose (nasopharynx) to your eardrum is blocked. This blockage may result from a cold, environmental allergies, or an upper respiratory infection. Unresolved barotitis media may lead to damage or hearing loss (barotrauma), which may become permanent. HOME CARE INSTRUCTIONS   Use medicines as recommended by your health care provider. Over-the-counter medicines will help unblock the canal and can help during times of air travel.  Do not put anything into your ears to clean or unplug them. Eardrops will not be helpful.  Do not swim, dive, or fly until your health care provider says it is all right to do so. If these activities are necessary, chewing gum with frequent, forceful swallowing may help. It is also helpful to hold your nose and gently blow to pop your ears for equalizing pressure changes. This forces air into the eustachian tube.  Only take over-the-counter or prescription  medicines for pain, discomfort, or fever as directed by your health care provider.  A decongestant may be helpful in decongesting the middle ear and make pressure equalization easier. SEEK MEDICAL CARE IF:  You experience a serious form of dizziness in which you feel as if the room is spinning and you feel nauseated (vertigo).  Your symptoms only involve one ear. SEEK IMMEDIATE MEDICAL CARE IF:   You develop a severe headache, dizziness, or severe ear pain.  You have bloody or pus-like drainage from your ears.  You develop a fever.  Your problems do not improve or become worse. MAKE SURE YOU:   Understand these instructions.  Will watch your condition.  Will get help right away if you are not doing well or get worse. Document Released: 06/15/2000 Document Revised: 04/08/2013 Document Reviewed: 01/13/2013 [facility] Patient Information 2015 [place], [place]. This information is not intended to replace advice given to you  by your health care provider. Make sure you discuss any questions you have with your health care provider. ° °

## 2015-03-27 LAB — CULTURE, GROUP A STREP (THRC)

## 2015-03-28 MED ORDER — FLUCONAZOLE 150 MG PO TABS: 150.0000 mg | ORAL_TABLET | Freq: Every day | ORAL | Status: DC

## 2015-04-09 ENCOUNTER — Encounter: Payer: Self-pay | Admitting: Gynecology

## 2015-04-09 ENCOUNTER — Ambulatory Visit
Admission: EM | Admit: 2015-04-09 | Discharge: 2015-04-09 | Disposition: A | Discharge status: Home / Self Care | Payer: 59 | Attending: Family Medicine | Admitting: Family Medicine

## 2015-04-09 DIAGNOSIS — R590 Localized enlarged lymph nodes: Secondary | ICD-10-CM

## 2015-04-09 DIAGNOSIS — L309 Dermatitis, unspecified: Secondary | ICD-10-CM | POA: Diagnosis not present

## 2015-04-09 DIAGNOSIS — H6593 Unspecified nonsuppurative otitis media, bilateral: Secondary | ICD-10-CM

## 2015-04-09 DIAGNOSIS — L03011 Cellulitis of right finger: Secondary | ICD-10-CM | POA: Diagnosis not present

## 2015-04-09 DIAGNOSIS — R599 Enlarged lymph nodes, unspecified: Secondary | ICD-10-CM | POA: Diagnosis not present

## 2015-04-09 DIAGNOSIS — T7840XA Allergy, unspecified, initial encounter: Secondary | ICD-10-CM

## 2015-04-09 DIAGNOSIS — B351 Tinea unguium: Secondary | ICD-10-CM

## 2015-04-09 MED ORDER — RANITIDINE HCL 150 MG PO CAPS: 150.0000 mg | ORAL_CAPSULE | Freq: Every day | ORAL | Status: DC

## 2015-04-09 MED ORDER — FLUCONAZOLE 150 MG PO TABS: 150.0000 mg | ORAL_TABLET | Freq: Once | ORAL | Status: DC

## 2015-04-09 MED ORDER — DIPHENHYDRAMINE HCL 50 MG PO CAPS
50.0000 mg | ORAL_CAPSULE | Freq: Once | ORAL | Status: AC
  Administered 2015-04-09: 50 mg via ORAL

## 2015-04-09 MED ORDER — DIPHENHYDRAMINE HCL 25 MG PO TABS: 25.0000 mg | ORAL_TABLET | Freq: Three times a day (TID) | ORAL | Status: DC | PRN

## 2015-04-09 MED ORDER — CETIRIZINE HCL 10 MG PO CAPS: 10.0000 mg | ORAL_CAPSULE | Freq: Every day | ORAL | Status: DC

## 2015-04-09 NOTE — ED Notes (Signed)
Patient c/o itching on feet / hand/ scalp x week.

## 2015-04-09 NOTE — ED Provider Notes (Signed)
CSN: [id number]     Arrival date & time 04/09/15  1442 History   First MD Initiated Contact with Patient 04/09/15 1454     Chief Complaint  Patient presents with  . Pruritis   (Consider location/radiation/quality/duration/timing/severity/associated sxs/prior Treatment) HPI Comments: Married caucasian female here for itching bilateral feet, arms, scalp.  Was seen for ear pain/enlarged lymph node behind left ear/dizziness/cold symptoms on 25 Mar 2015 started on suprax.  Did not take meclizine for dizziness.  Adopted new kitten x 2 weeks ago indoor pet denied history of pet allergies.  Also moved to new rental home 3 months ago.  Mopped and cleaned floor barefoot previous home had carpet new home vinyl and tile.  Has noticed discoloration white and pain bilateral big toes along distal nailbed lateral.  Last pedicure Jun 2016.  Has not taken diflucan or meclizine.  Occasional mild dizzyness still.  Ear discomfort resolving.  Lymph node behind left ear still inflamed  Feet feel better less itchy with socks and shoes on.  Hands also scratchy and noticed area on right lower back also.  Wondering if she has ringworm from kitten or if itching related to illness she has had.  Spouse with patient today.  The history is provided by the patient and the spouse.    Past Medical History  Diagnosis Date  . Hypertension   . Migraine   . Gestational diabetes     with 1st pregnancy  . Urinary tract infection    Past Surgical History  Procedure Laterality Date  . Tonsillectomy  1988  . Tubes in ears  Baby  . Dilation and evacuation  03/09/2011    Procedure: DILATATION AND EVACUATION (D&E);  Surgeon: Donnamae Jude, MD;  Location: [facility];  Service: Gynecology;  Laterality: N/A;  . Cholecystectomy N/A 11/2013   Family History  Problem Relation Age of Onset  . Diabetes Mother   . Osteoarthritis Mother   . Hypertension Mother   . Hypothyroidism Mother   . Heart disease Mother   . Hyperlipidemia Mother   .  Migraines Mother   . Mental illness Mother     Depression  . Hypertension Father   . Heart failure Father   . Hepatitis Father   . Cirrhosis Father   . Hyperlipidemia Father   . Migraines Father   . Mental illness Father     Depression  . Breast cancer Maternal Aunt 70  . Cancer Sister 41    Cervical  . Heart disease Sister     Heart Stops  . Migraines Sister   . Seizures Sister 11    unsure if this or heart problem  . Mental illness Sister     depression  . Heart disease Maternal Grandmother    Social History  Substance Use Topics  . Smoking status: Current Some Day Smoker -- 0.50 packs/day for 12 years    Types: Cigarettes  . Smokeless tobacco: Never Used  . Alcohol Use: 0.5 oz/week    1 Standard drinks or equivalent per week   OB History    Gravida Para Term Preterm AB TAB SAB Ectopic Multiple Living   6 3 3  0 3 2 1  0 0 3     Review of Systems  Constitutional: Negative for fever, chills, diaphoresis, activity change, appetite change, fatigue and unexpected weight change.  HENT: Negative for congestion, dental problem, drooling, ear discharge, ear pain, facial swelling, hearing loss, mouth sores, nosebleeds, postnasal drip, rhinorrhea, sinus pressure, sneezing, sore throat,  tinnitus, trouble swallowing and voice change.   Eyes: Negative for photophobia, pain, discharge, redness, itching and visual disturbance.  Respiratory: Negative for cough, choking, chest tightness, shortness of breath, wheezing and stridor.   Cardiovascular: Negative for chest pain, palpitations and leg swelling.  Gastrointestinal: Negative for nausea, vomiting, abdominal pain, diarrhea, constipation, blood in stool, abdominal distention, anal bleeding and rectal pain.  Endocrine: Negative for cold intolerance, heat intolerance and polydipsia.  Genitourinary: Negative for dysuria, frequency, hematuria, flank pain and difficulty urinating.  Musculoskeletal: Negative for myalgias, back pain, joint  swelling, arthralgias, gait problem, neck pain and neck stiffness.  Skin: Positive for color change and rash. Negative for pallor and wound.  Allergic/Immunologic: Negative for environmental allergies and food allergies.  Neurological: Positive for dizziness. Negative for tremors, seizures, syncope, facial asymmetry, speech difficulty, weakness, light-headedness, numbness and headaches.  Hematological: Positive for adenopathy. Does not bruise/bleed easily.  Psychiatric/Behavioral: Negative for behavioral problems, confusion, sleep disturbance and agitation.    Allergies  Review of patient's allergies indicates no known allergies.  Home Medications   Prior to Admission medications   Medication Sig Start Date End Date Taking? Authorizing Provider  acetaminophen (TYLENOL) 500 MG tablet Take 1,000 mg by mouth every 6 (six) hours as needed for pain.   Yes Historical Provider, MD  atenolol (TENORMIN) 50 MG tablet TAKE 1 TABLET BY MOUTH EVERY DAY 01/28/14  Yes Donnamae Jude, MD  hydrochlorothiazide (HYDRODIURIL) 25 MG tablet Take 1 tablet (25 mg total) by mouth daily. 01/28/14  Yes Donnamae Jude, MD  ibuprofen (ADVIL,MOTRIN) 600 MG tablet Take 1 tablet (600 mg total) by mouth every 6 (six) hours. 03/02/13  Yes Seabron Spates, CNM  levonorgestrel (MIRENA) 20 MCG/24HR IUD by Intrauterine route.   Yes Historical Provider, MD  meclizine (ANTIVERT) 25 MG tablet Take 1 tablet (25 mg total) by mouth 3 (three) times daily as needed for dizziness. 03/25/15  Yes Olen Cordial, NP  Cetirizine HCl 10 MG CAPS Take 1 capsule (10 mg total) by mouth daily. 04/09/15   Olen Cordial, NP  diphenhydrAMINE (BENADRYL) 25 MG tablet Take 1 tablet (25 mg total) by mouth every 8 (eight) hours as needed for itching. 04/09/15   Olen Cordial, NP  ranitidine (ZANTAC) 150 MG capsule Take 1 capsule (150 mg total) by mouth daily. 04/09/15   Olen Cordial, NP   Meds Ordered and Administered this Visit   Medications    diphenhydrAMINE (BENADRYL) capsule 50 mg (50 mg Oral Given 04/09/15 1535)    BP 130/79 mmHg  Pulse 64  Temp(Src) 98 F (36.7 C) (Tympanic)  Resp 20  Ht 5\' 4"  (1.626 m)  Wt 188 lb (85.276 kg)  BMI 32.25 kg/m2  SpO2 99% No data found.   Physical Exam  Constitutional: She is oriented to person, place, and time. Vital signs are normal. She appears well-developed and well-nourished. No distress.  HENT:  Head: Normocephalic and atraumatic.  Right Ear: Hearing, external ear and ear canal normal. A middle ear effusion is present.  Left Ear: Hearing, external ear and ear canal normal. A middle ear effusion is present.  Nose: Nose normal. No mucosal edema, rhinorrhea, nose lacerations, sinus tenderness, nasal deformity, septal deviation or nasal septal hematoma. No epistaxis.  No foreign bodies. Right sinus exhibits no maxillary sinus tenderness and no frontal sinus tenderness. Left sinus exhibits no maxillary sinus tenderness and no frontal sinus tenderness.  Mouth/Throat: Mucous membranes are normal. Mucous membranes are not pale, not dry  and not cyanotic. She does not have dentures. No oral lesions. No trismus in the jaw. Normal dentition. No dental abscesses, uvula swelling, lacerations or dental caries. Posterior oropharyngeal edema and posterior oropharyngeal erythema present. No oropharyngeal exudate or tonsillar abscesses.  Left postauricular lymph node enlargement still present less than 1cm diameter not TTP today; erythema left TM and cloudiness air fluid levels has cleared since last exam;  Pinna/tragus/pre/post auricular bilaterally not TTP  Eyes: Conjunctivae, EOM and lids are normal. Pupils are equal, round, and reactive to light. Right eye exhibits no chemosis, no discharge, no exudate and no hordeolum. No foreign body present in the right eye. Left eye exhibits no chemosis, no discharge, no exudate and no hordeolum. No foreign body present in the left eye. Right conjunctiva is not  injected. Right conjunctiva has no hemorrhage. Left conjunctiva is not injected. Left conjunctiva has no hemorrhage. No scleral icterus. Right eye exhibits normal extraocular motion and no nystagmus. Left eye exhibits normal extraocular motion and no nystagmus. Right pupil is round and reactive. Left pupil is round and reactive. Pupils are equal.  Neck: Trachea normal and normal range of motion. Neck supple. No tracheal tenderness, no spinous process tenderness and no muscular tenderness present. No rigidity. No tracheal deviation, no edema, no erythema and normal range of motion present. No thyroid mass and no thyromegaly present.  Cardiovascular: Normal rate, regular rhythm, S1 normal, S2 normal, normal heart sounds and intact distal pulses.  PMI is not displaced.  Exam reveals no gallop and no friction rub.   No murmur heard. Pulses:      Radial pulses are 2+ on the right side, and 2+ on the left side.       Dorsalis pedis pulses are 2+ on the right side, and 2+ on the left side.  Pulmonary/Chest: Effort normal and breath sounds normal. No accessory muscle usage or stridor. No respiratory distress. She has no decreased breath sounds. She has no wheezes. She has no rhonchi. She has no rales. She exhibits no tenderness.  Abdominal: Soft. Bowel sounds are normal. She exhibits no shifting dullness, no distension, no pulsatile liver, no fluid wave, no abdominal bruit, no ascites, no pulsatile midline mass and no mass. There is no hepatosplenomegaly. There is no tenderness. There is no rigidity, no rebound, no guarding, no tenderness at McBurney's point and negative Murphy's sign. Hernia confirmed negative in the ventral area.  Dull to percussion x 4 quads  Musculoskeletal: Normal range of motion. She exhibits edema and tenderness.       Right hand: She exhibits swelling. She exhibits normal range of motion, no tenderness, no bony tenderness, normal two-point discrimination, normal capillary refill, no  deformity and no laceration.       Left hand: She exhibits swelling. She exhibits normal range of motion, no tenderness, no bony tenderness, normal two-point discrimination, normal capillary refill, no deformity and no laceration. Normal sensation noted. Normal strength noted.  Bilateral nonpitting edema 0-1+/4 patient itching hands/forearms, shins, feet  Lymphadenopathy:       Head (right side): No submental, no submandibular, no tonsillar, no preauricular, no posterior auricular and no occipital adenopathy present.       Head (left side): Posterior auricular adenopathy present. No submental, no submandibular, no tonsillar, no preauricular and no occipital adenopathy present.    She has no cervical adenopathy.       Right cervical: No superficial cervical, no deep cervical and no posterior cervical adenopathy present.  Left cervical: No superficial cervical, no deep cervical and no posterior cervical adenopathy present.  Neurological: She is alert and oriented to person, place, and time. She displays no atrophy and no tremor. No cranial nerve deficit or sensory deficit. She exhibits normal muscle tone. She displays no seizure activity. Coordination and gait normal. GCS eye subscore is 4. GCS verbal subscore is 5. GCS motor subscore is 6.  Skin: Skin is warm, dry and intact. Rash noted. No abrasion, no bruising, no burn, no ecchymosis, no laceration, no lesion, no petechiae and no purpura noted. Rash is macular and maculopapular. Rash is not papular, not nodular, not pustular, not vesicular and not urticarial. She is not diaphoretic. There is erythema. No cyanosis. No pallor. Nails show no clubbing.     Macular rash bilateral feet and hands 0-1+/4 nonpitting edema patient noticed as exam continued her wedding ring was getting tight and had been loose upon awakening this am patient itching constantly hands and feet after shoes and socks taken off for evaluation speaking in full sentences; papular  grouped rash medial ankles slight erythema dry and right flank grouped papular rash slight scale no erythema itchy per patient  Psychiatric: She has a normal mood and affect. Her speech is normal and behavior is normal. Judgment and thought content normal. Cognition and memory are normal.  Nursing note and vitals reviewed.   ED Course  Procedures (including critical care time)  Labs Review Labs Reviewed - No data to display  Imaging Review No results found.  1530 patient noted hands are swelling ring starting to get tight and itching.  Benadryl 50mg  po x 1 now ordered.  [place] administered benadryl 50mg  po x 1 now.   Filed Vitals:   04/09/15 1550  BP: 124/80  Pulse: 69  Temp:   Resp:    1550 patient reported feeling better requested discharge as needs to go and pick up son.  Will stop suprax and continue to monitor symptoms consider medication allergy versus cat dander allergy or contact dermatitis from cleaning supplies.  If rash continues despite stopping suprax more likely contact dermatitis or pet allergy.  If worsening contact clinic consider prednisone taper.  If dyspnea, tongue swelling, SOB, chest pain/tightness, dysphagia to ER for re-evaluation.  Take zyrtec on regular schedule and benadryl prn q6h.  Patient and spouse verbalized understanding of information/instructions, agreed with plan of care and had no further questions at this time.  MDM   1. Dermatitis   2. Onychomycosis   3. Paronychia, right   4. Allergic reaction, initial encounter   5. Lymphadenopathy, postauricular   6. Otitis media with effusion, bilateral   Contact versus allergic reaction to suprax.  Will stop suprax at this time.  Start zyrtec 10mg  po daily may take second dose at bedtime if breakthrough itching or benadryl 25-50mg  po.  Zantac 150mg  po BID prn itching.  Medication as directed.  Symptomatic therapy suggested.  Warm to cool water soaks and/or oatmeal baths.  Call or return to  clinic as needed if these symptoms worsen or fail to improve as anticipated.  Exitcare handout on dermatitis given to patient.  Patient verbalized agreement and understanding of treatment plan.    Avoid pedicare/manicare spas that do not use new sets for each client and disinfect bowls or use disposable liners between each client.  Discussed antifungal powder in footwear.  Rotating footwear and placing footwear in sun to dry out between use.  Exitcare handout on onychomycosis given to  patient.  Consider podiatry and lamisil therapy.  Infection mild affecting only distal bilateral 1st toes. Exitcare handout on onychomycosis given to patient.  Patient verbalized understanding and agreed with plan of care and had no further questions at this time.  Start Epsom salt warm/hot soaks 2-3 times per day for 20 minutes.  Monitor for red streaks, fever, worsening pain in affected extremity.  Purulent discharge may develop in the next 24 hours but should decrease and resolve.  If worsening contact PCM or [place] will consider starting patient on keflex or bactrim x 7 days.  Follow up with PCM if worsening pain, red streaks, pain, and discharge.  Patient given Exitcare handout on paronychia.  Patient verbalized understanding of instructions, agreed with plan of care and had no further questions at this time.    Discussed with patient lymphadenopathy should resolve within 30 days and if chronic to follow up with [facility] for re-evaluation.  Consider ENT referral if no resolution. Decreased size and no longer TTP with suprax but not resolved.  Erythema TM resolved.  Bilateral TM air fluid level clear continues.   Exitcare handout on lymphadenopathy given to patient. Patient verbalized understanding of information/instructions, agreed with plan of care and had no further questions at this time.  Supportive treatment.   No evidence of invasive bacterial infection, non toxic and well hydrated.  This is most likely self limiting  viral infection.  I do not see where any further testing or imaging is necessary at this time.   I will suggest supportive care, rest, good hygiene and encourage the patient to take adequate fluids.  The patient is to return to clinic or EMERGENCY ROOM if symptoms worsen or change significantly e.g. ear pain, fever, purulent discharge from ears or bleeding.  Exitcare handout on otitis media with effusion given to patient.  Patient verbalized agreement and understanding of treatment plan.      Olen Cordial, NP 04/10/15 1136

## 2015-04-09 NOTE — Discharge Instructions (Signed)
Nail Ringworm A fungal infection of the nail (tinea unguium/onychomycosis) is common. It is common as the visible part of the nail is composed of dead cells which have no blood supply to help prevent infection. It occurs because fungi are everywhere and will pick any opportunity to grow on any dead material. Because nails are very slow growing they require up to 2 years of treatment with anti-fungal medications. The entire nail back to the base is infected. This includes approximately  of the nail which you cannot see. If your caregiver has prescribed a medication by mouth, take it every day and as directed. No progress will be seen for at least 6 to 9 months. Do not be disappointed! Because fungi live on dead cells with little or no exposure to blood supply, medication delivery to the infection is slow; thus the cure is slow. It is also why you can observe no progress in the first 6 months. The nail becoming cured is the base of the nail, as it has the blood supply. Topical medication such as creams and ointments are usually not effective. Important in successful treatment of nail fungus is closely following the medication regimen that your doctor prescribes. Sometimes you and your caregiver may elect to speed up this process by surgical removal of all the nails. Even this may still require 6 to 9 months of additional oral medications. See your caregiver as directed. Remember there will be no visible improvement for at least 6 months. See your caregiver sooner if other signs of infection (redness and swelling) develop.  Paronychia Paronychia is an infection of the skin that surrounds a nail. It usually affects the skin around a fingernail, but it may also occur near a toenail. It often causes pain and swelling around the nail. This condition may come on suddenly or develop over a longer period. In some cases, a collection of pus (abscess) can form near or under the nail. Usually, paronychia is not serious  and it clears up with treatment. CAUSES This condition may be caused by bacteria or fungi. It is commonly caused by either Streptococcus or Staphylococcus bacteria. The bacteria or fungi often cause the infection by getting into the affected area through an opening in the skin, such as a cut or a hangnail. RISK FACTORS This condition is more likely to develop in:  People who get their hands wet often, such as those who work as Designer, industrial/product, bartenders, or nurses.  People who bite their fingernails or suck their thumbs.  People who trim their nails too short.  People who have hangnails or injured fingertips.  People who get manicures.  People who have diabetes. SYMPTOMS Symptoms of this condition include:  Redness and swelling of the skin near the nail.  Tenderness around the nail when you touch the area.  Pus-filled bumps under the cuticle. The cuticle is the skin at the base or sides of the nail.  Fluid or pus under the nail.  Throbbing pain in the area. DIAGNOSIS This condition is usually diagnosed with a physical exam. In some cases, a sample of pus may be taken from an abscess to be tested in a lab. This can help to determine what type of bacteria or fungi is causing the condition. TREATMENT Treatment for this condition depends on the cause and severity of the condition. If the condition is mild, it may clear up on its own in a few days. Your health care provider may recommend soaking the affected area in warm  water a few times a day. When treatment is needed, the options may include:  Antibiotic medicine, if the condition is caused by a bacterial infection.  Antifungal medicine, if the condition is caused by a fungal infection.  Incision and drainage, if an abscess is present. In this procedure, the health care provider will cut open the abscess so the pus can drain out. HOME CARE INSTRUCTIONS  Soak the affected area in warm water if directed to do so by your health care  provider. You may be told to do this for 20 minutes, 2-3 times a day. Keep the area dry in between soakings.  Take medicines only as directed by your health care provider.  If you were prescribed an antibiotic medicine, finish all of it even if you start to feel better.  Keep the affected area clean.  Do not try to drain a fluid-filled bump yourself.  If you will be washing dishes or performing other tasks that require your hands to get wet, wear rubber gloves. You should also wear gloves if your hands might come in contact with irritating substances, such as cleaners or chemicals.  Follow your health care provider's instructions about:  Wound care.  Bandage (dressing) changes and removal. SEEK MEDICAL CARE IF:  Your symptoms get worse or do not improve with treatment.  You have a fever or chills.  You have redness spreading from the affected area.  You have continued or increased fluid, blood, or pus coming from the affected area.  Your finger or knuckle becomes swollen or is difficult to move.   This information is not intended to replace advice given to you by your health care provider. Make sure you discuss any questions you have with your health care provider.   Document Released: 12/12/2000 Document Revised: 11/02/2014 Document Reviewed: 05/26/2014 Elsevier Interactive Patient Education [employer].  This information is not intended to replace advice given to you by your health care provider. Make sure you discuss any questions you have with your health care provider.   Document Released: 06/15/2000 Document Revised: 11/02/2014 Document Reviewed: 12/20/2014 Elsevier Interactive Patient Education 2016 [place] Dermatitis Dermatitis is redness, soreness, and swelling (inflammation) of the skin. Contact dermatitis is a reaction to certain substances that touch the skin. There are two types of contact dermatitis:   Irritant contact dermatitis. This type  is caused by something that irritates your skin, such as dry hands from washing them too much. This type does not require previous exposure to the substance for a reaction to occur. This type is more common.  Allergic contact dermatitis. This type is caused by a substance that you are allergic to, such as a nickel allergy or poison ivy. This type only occurs if you have been exposed to the substance (allergen) before. Upon a repeat exposure, your body reacts to the substance. This type is less common. CAUSES  Many different substances can cause contact dermatitis. Irritant contact dermatitis is most commonly caused by exposure to:   Makeup.   Soaps.   Detergents.   Bleaches.   Acids.   Metal salts, such as nickel.  Allergic contact dermatitis is most commonly caused by exposure to:   Poisonous plants.   Chemicals.   Jewelry.   Latex.   Medicines.   Preservatives in products, such as clothing.  RISK FACTORS This condition is more likely to develop in:   People who have jobs that expose them to irritants or allergens.  People who have  certain medical conditions, such as asthma or eczema.  SYMPTOMS  Symptoms of this condition may occur anywhere on your body where the irritant has touched you or is touched by you. Symptoms include:  Dryness or flaking.   Redness.   Cracks.   Itching.   Pain or a burning feeling.   Blisters.  Drainage of small amounts of blood or clear fluid from skin cracks. With allergic contact dermatitis, there may also be swelling in areas such as the eyelids, mouth, or genitals.  DIAGNOSIS  This condition is diagnosed with a medical history and physical exam. A patch skin test may be performed to help determine the cause. If the condition is related to your job, you may need to see an occupational medicine specialist. TREATMENT Treatment for this condition includes figuring out what caused the reaction and protecting your  skin from further contact. Treatment may also include:   Steroid creams or ointments. Oral steroid medicines may be needed in more severe cases.  Antibiotics or antibacterial ointments, if a skin infection is present.  Antihistamine lotion or an antihistamine taken by mouth to ease itching.  A bandage (dressing). HOME CARE INSTRUCTIONS Skin Care  Moisturize your skin as needed.   Apply cool compresses to the affected areas.  Try taking a bath with:  Epsom salts. Follow the instructions on the packaging. You can get these at your local pharmacy or grocery store.  Baking soda. Pour a small amount into the bath as directed by your health care provider.  Colloidal oatmeal. Follow the instructions on the packaging. You can get this at your local pharmacy or grocery store.  Try applying baking soda paste to your skin. Stir water into baking soda until it reaches a paste-like consistency.  Do not scratch your skin.  Bathe less frequently, such as every other day.  Bathe in lukewarm water. Avoid using hot water. Medicines  Take or apply over-the-counter and prescription medicines only as told by your health care provider.   If you were prescribed an antibiotic medicine, take or apply your antibiotic as told by your health care provider. Do not stop using the antibiotic even if your condition starts to improve. General Instructions  Keep all follow-up visits as told by your health care provider. This is important.  Avoid the substance that caused your reaction. If you do not know what caused it, keep a journal to try to track what caused it. Write down:  What you eat.  What cosmetic products you use.  What you drink.  What you wear in the affected area. This includes jewelry.  If you were given a dressing, take care of it as told by your health care provider. This includes when to change and remove it. SEEK MEDICAL CARE IF:   Your condition does not improve with  treatment.  Your condition gets worse.  You have signs of infection such as swelling, tenderness, redness, soreness, or warmth in the affected area.  You have a fever.  You have new symptoms. SEEK IMMEDIATE MEDICAL CARE IF:   You have a severe headache, neck pain, or neck stiffness.  You vomit.  You feel very sleepy.  You notice red streaks coming from the affected area.  Your bone or joint underneath the affected area becomes painful after the skin has healed.  The affected area turns darker.  You have difficulty breathing.   This information is not intended to replace advice given to you by your health care provider. Make  sure you discuss any questions you have with your health care provider.   Document Released: 06/15/2000 Document Revised: 03/09/2015 Document Reviewed: 11/03/2014 Elsevier Interactive Patient Education 2016 [place]. Otitis Media With Effusion Otitis media with effusion is the presence of fluid in the middle ear. This is a common problem in children, which often follows ear infections. It may be present for weeks or longer after the infection. Unlike an acute ear infection, otitis media with effusion refers only to fluid behind the ear drum and not infection. Children with repeated ear and sinus infections and allergy problems are the most likely to get otitis media with effusion. CAUSES  The most frequent cause of the fluid buildup is dysfunction of the eustachian tubes. These are the tubes that drain fluid in the ears to the back of the nose (nasopharynx). SYMPTOMS   The main symptom of this condition is hearing loss. As a result, you or your child may:  Listen to the TV at a loud volume.  Not respond to questions.  Ask "what" often when spoken to.  Mistake or confuse one sound or word for another.  There may be a sensation of fullness or pressure but usually not pain. DIAGNOSIS   Your health care provider will diagnose this condition by  examining you or your child's ears.  Your health care provider may test the pressure in you or your child's ear with a tympanometer.  A hearing test may be conducted if the problem persists. TREATMENT   Treatment depends on the duration and the effects of the effusion.  Antibiotics, decongestants, nose drops, and cortisone-type drugs (tablets or nasal spray) may not be helpful.  Children with persistent ear effusions may have delayed language or behavioral problems. Children at risk for developmental delays in hearing, learning, and speech may require referral to a specialist earlier than children not at risk.  You or your child's health care provider may suggest a referral to an ear, nose, and throat surgeon for treatment. The following may help restore normal hearing:  Drainage of fluid.  Placement of ear tubes (tympanostomy tubes).  Removal of adenoids (adenoidectomy). HOME CARE INSTRUCTIONS   Avoid secondhand smoke.  Infants who are breastfed are less likely to have this condition.  Avoid feeding infants while they are lying flat.  Avoid known environmental allergens.  Avoid people who are sick. SEEK MEDICAL CARE IF:   Hearing is not better in 3 months.  Hearing is worse.  Ear pain.  Drainage from the ear.  Dizziness. MAKE SURE YOU:   Understand these instructions.  Will watch your condition.  Will get help right away if you are not doing well or get worse.   This information is not intended to replace advice given to you by your health care provider. Make sure you discuss any questions you have with your health care provider.   Document Released: 07/26/2004 Document Revised: 07/09/2014 Document Reviewed: 01/13/2013 Elsevier Interactive Patient Education 2016 [employer] Lymphadenopathy Lymphadenopathy refers to swollen or enlarged lymph glands, also called lymph nodes. Lymph glands are part of your body's defense (immune) system, which protects the body  from infections, germs, and diseases. Lymph glands are found in many locations in your body, including the neck, underarm, and groin.  Many things can cause lymph glands to become enlarged. When your immune system responds to germs, such as viruses or bacteria, infection-fighting cells and fluid build up. This causes the glands to grow in size. Usually, this is not something  to worry about. The swelling and any soreness often go away without treatment. However, swollen lymph glands can also be caused by a number of diseases. Your health care provider may do various tests to help determine the cause. If the cause of your swollen lymph glands cannot be found, it is important to monitor your condition to make sure the swelling goes away. HOME CARE INSTRUCTIONS Watch your condition for any changes. The following actions may help to lessen any discomfort you are feeling:  Get plenty of rest.  Take medicines only as directed by your health care provider. Your health care provider may recommend over-the-counter medicines for pain.  Apply moist heat compresses to the site of swollen lymph nodes as directed by your health care provider. This can help reduce any pain.  Check your lymph nodes daily for any changes.  Keep all follow-up visits as directed by your health care provider. This is important. SEEK MEDICAL CARE IF:  Your lymph nodes are still swollen after 2 weeks.  Your swelling increases or spreads to other areas.  Your lymph nodes are hard, seem fixed to the skin, or are growing rapidly.  Your skin over the lymph nodes is red and inflamed.  You have a fever.  You have chills.  You have fatigue.  You develop a sore throat.  You have abdominal pain.  You have weight loss.  You have night sweats. SEEK IMMEDIATE MEDICAL CARE IF:  You notice fluid leaking from the area of the enlarged lymph node.  You have severe pain in any area of your body.  You have chest pain.  You have  shortness of breath.   This information is not intended to replace advice given to you by your health care provider. Make sure you discuss any questions you have with your health care provider.   Document Released: 03/27/2008 Document Revised: 07/09/2014 Document Reviewed: 01/21/2014 Elsevier Interactive Patient Education [employer].

## 2015-06-21 ENCOUNTER — Other Ambulatory Visit (INDEPENDENT_AMBULATORY_CARE_PROVIDER_SITE_OTHER): Payer: 59 | Admitting: *Deleted

## 2015-06-21 DIAGNOSIS — N912 Amenorrhea, unspecified: Secondary | ICD-10-CM | POA: Diagnosis not present

## 2015-06-21 NOTE — Progress Notes (Signed)
Patient here today for a beta HCG.

## 2015-06-22 LAB — HCG, QUANTITATIVE, PREGNANCY: hCG, Beta Chain, Quant, S: <2 m[IU]/mL

## 2015-07-03 NOTE — L&D Delivery Note (Signed)
Delivery Note At 4:02 PM a viable female was delivered via Vaginal, Spontaneous Delivery (Presentation: vertex, OA ).  APGAR: 9, 9; weight  pending.   Placenta status: delivered intact with gentle traction  Cord:  3 vessel with the following complications:none .    Anesthesia: epidural Episiotomy: None Lacerations: None Est. Blood Loss (mL): 100  Mom to postpartum.  Baby to Couplet care / Skin to Skin.  [name] 04/28/2016, 5:10 PM

## 2015-09-08 ENCOUNTER — Encounter: Payer: Self-pay | Admitting: Family Medicine

## 2015-09-08 ENCOUNTER — Ambulatory Visit (INDEPENDENT_AMBULATORY_CARE_PROVIDER_SITE_OTHER): Payer: 59 | Admitting: Family Medicine

## 2015-09-08 VITALS — BP 128/84 | HR 75 | Ht 64.0 in | Wt 186.0 lb

## 2015-09-08 DIAGNOSIS — O3680X1 Pregnancy with inconclusive fetal viability, fetus 1: Secondary | ICD-10-CM | POA: Diagnosis not present

## 2015-09-08 DIAGNOSIS — O3680X0 Pregnancy with inconclusive fetal viability, not applicable or unspecified: Secondary | ICD-10-CM

## 2015-09-08 DIAGNOSIS — N912 Amenorrhea, unspecified: Secondary | ICD-10-CM

## 2015-09-08 DIAGNOSIS — Z3201 Encounter for pregnancy test, result positive: Secondary | ICD-10-CM

## 2015-09-08 LAB — POCT URINE PREGNANCY: Preg Test, Ur: POSITIVE — AB

## 2015-09-08 NOTE — Progress Notes (Signed)
    Subjective:    Patient ID: [name] is a 35 y.o. female presenting with Gynecologic Exam  on 09/08/2015  HPI: Here for annual exam. IUD placed 2 years ago. When here in 08/2014 IUD strings noted. Has been having regular cycles. Missed period and has positive home UPT.  Review of Systems  Constitutional: Negative for fever and chills.  Respiratory: Negative for shortness of breath.   Cardiovascular: Negative for chest pain.  Gastrointestinal: Negative for nausea, vomiting and abdominal pain.  Genitourinary: Negative for dysuria.  Skin: Negative for rash.      Objective:    BP 128/84 mmHg  Pulse 75  Ht 5\' 4"  (1.626 m)  Wt 186 lb (84.369 kg)  BMI 31.91 kg/m2  LMP 07/30/2015 Physical Exam  Constitutional: She is oriented to person, place, and time. She appears well-developed and well-nourished. No distress.  HENT:  Head: Normocephalic and atraumatic.  Eyes: No scleral icterus.  Neck: Neck supple.  Cardiovascular: Normal rate.   Pulmonary/Chest: Effort normal.  Abdominal: Soft.  Genitourinary: Vagina normal.  Neurological: She is alert and oriented to person, place, and time.  Skin: Skin is warm and dry.  Psychiatric: She has a normal mood and affect.   UPT positive TVUS - reveals thickened endometrium with stripe at 10 mm. No IUD is noted here. Right ovary with small corpus luteum. Left ovary WNL. No free pelvic fluid.    Assessment & Plan:  1. Amenorrhea  - POCT urine pregnancy  2. Pregnancy of unknown anatomic location Return on Monday for BHCG and repeat in 48 hours. Ectopic precautions. F/u u/s in 7-10 days.   Return in about 1 week (around 09/15/2015) for pelvic sono, 3 days for BHCG.  Bryttney Netzer S 09/08/2015 2:32 PM

## 2015-09-08 NOTE — Progress Notes (Signed)
Here today for yearly exam.  We scheduled to have her IUD replaced today.  Had positive home pregnancy test. Patient wants to know if she expelled IUD or conceived with IUD in place.

## 2015-09-08 NOTE — Patient Instructions (Signed)
First Trimester of Pregnancy The first trimester of pregnancy is from week 1 until the end of week 12 (months 1 through 3). A week after a sperm fertilizes an egg, the egg will implant on the wall of the uterus. This embryo will begin to develop into a baby. Genes from you and your partner are forming the baby. The female genes determine whether the baby is a boy or a girl. At 6-8 weeks, the eyes and face are formed, and the heartbeat can be seen on ultrasound. At the end of 12 weeks, all the baby's organs are formed.  Now that you are pregnant, you will want to do everything you can to have a healthy baby. Two of the most important things are to get good prenatal care and to follow your health care provider's instructions. Prenatal care is all the medical care you receive before the baby's birth. This care will help prevent, find, and treat any problems during the pregnancy and childbirth. BODY CHANGES Your body goes through many changes during pregnancy. The changes vary from woman to woman.   You may gain or lose a couple of pounds at first.  You may feel sick to your stomach (nauseous) and throw up (vomit). If the vomiting is uncontrollable, call your health care provider.  You may tire easily.  You may develop headaches that can be relieved by medicines approved by your health care provider.  You may urinate more often. Painful urination may mean you have a bladder infection.  You may develop heartburn as a result of your pregnancy.  You may develop constipation because certain hormones are causing the muscles that push waste through your intestines to slow down.  You may develop hemorrhoids or swollen, bulging veins (varicose veins).  Your breasts may begin to grow larger and become tender. Your nipples may stick out more, and the tissue that surrounds them (areola) may become darker.  Your gums may bleed and may be sensitive to brushing and flossing.  Dark spots or blotches (chloasma,  mask of pregnancy) may develop on your face. This will likely fade after the baby is born.  Your menstrual periods will stop.  You may have a loss of appetite.  You may develop cravings for certain kinds of food.  You may have changes in your emotions from day to day, such as being excited to be pregnant or being concerned that something may go wrong with the pregnancy and baby.  You may have more vivid and strange dreams.  You may have changes in your hair. These can include thickening of your hair, rapid growth, and changes in texture. Some women also have hair loss during or after pregnancy, or hair that feels dry or thin. Your hair will most likely return to normal after your baby is born. WHAT TO EXPECT AT YOUR PRENATAL VISITS During a routine prenatal visit:  You will be weighed to make sure you and the baby are growing normally.  Your blood pressure will be taken.  Your abdomen will be measured to track your baby's growth.  The fetal heartbeat will be listened to starting around week 10 or 12 of your pregnancy.  Test results from any previous visits will be discussed. Your health care provider may ask you:  How you are feeling.  If you are feeling the baby move.  If you have had any abnormal symptoms, such as leaking fluid, bleeding, severe headaches, or abdominal cramping.  If you are using any tobacco products,   including cigarettes, chewing tobacco, and electronic cigarettes.  If you have any questions. Other tests that may be performed during your first trimester include:  Blood tests to find your blood type and to check for the presence of any previous infections. They will also be used to check for low iron levels (anemia) and Rh antibodies. Later in the pregnancy, blood tests for diabetes will be done along with other tests if problems develop.  Urine tests to check for infections, diabetes, or protein in the urine.  An ultrasound to confirm the proper growth  and development of the baby.  An amniocentesis to check for possible genetic problems.  Fetal screens for spina bifida and Down syndrome.  You may need other tests to make sure you and the baby are doing well.  HIV (human immunodeficiency virus) testing. Routine prenatal testing includes screening for HIV, unless you choose not to have this test. HOME CARE INSTRUCTIONS  Medicines  Follow your health care provider's instructions regarding medicine use. Specific medicines may be either safe or unsafe to take during pregnancy.  Take your prenatal vitamins as directed.  If you develop constipation, try taking a stool softener if your health care provider approves. Diet  Eat regular, well-balanced meals. Choose a variety of foods, such as meat or vegetable-based protein, fish, milk and low-fat dairy products, vegetables, fruits, and whole grain breads and cereals. Your health care provider will help you determine the amount of weight gain that is right for you.  Avoid raw meat and uncooked cheese. These carry germs that can cause birth defects in the baby.  Eating four or five small meals rather than three large meals a day may help relieve nausea and vomiting. If you start to feel nauseous, eating a few soda crackers can be helpful. Drinking liquids between meals instead of during meals also seems to help nausea and vomiting.  If you develop constipation, eat more high-fiber foods, such as fresh vegetables or fruit and whole grains. Drink enough fluids to keep your urine clear or pale yellow. Activity and Exercise  Exercise only as directed by your health care provider. Exercising will help you:  Control your weight.  Stay in shape.  Be prepared for labor and delivery.  Experiencing pain or cramping in the lower abdomen or low back is a good sign that you should stop exercising. Check with your health care provider before continuing normal exercises.  Try to avoid standing for long  periods of time. Move your legs often if you must stand in one place for a long time.  Avoid heavy lifting.  Wear low-heeled shoes, and practice good posture.  You may continue to have sex unless your health care provider directs you otherwise. Relief of Pain or Discomfort  Wear a good support bra for breast tenderness.   Take warm sitz baths to soothe any pain or discomfort caused by hemorrhoids. Use hemorrhoid cream if your health care provider approves.   Rest with your legs elevated if you have leg cramps or low back pain.  If you develop varicose veins in your legs, wear support hose. Elevate your feet for 15 minutes, 3-4 times a day. Limit salt in your diet. Prenatal Care  Schedule your prenatal visits by the twelfth week of pregnancy. They are usually scheduled monthly at first, then more often in the last 2 months before delivery.  Write down your questions. Take them to your prenatal visits.  Keep all your prenatal visits as directed by your   health care provider. Safety  Wear your seat belt at all times when driving.  Make a list of emergency phone numbers, including numbers for family, friends, the hospital, and police and fire departments. General Tips  Ask your health care provider for a referral to a local prenatal education class. Begin classes no later than at the beginning of month 6 of your pregnancy.  Ask for help if you have counseling or nutritional needs during pregnancy. Your health care provider can offer advice or refer you to specialists for help with various needs.  Do not use hot tubs, steam rooms, or saunas.  Do not douche or use tampons or scented sanitary pads.  Do not cross your legs for long periods of time.  Avoid cat litter boxes and soil used by cats. These carry germs that can cause birth defects in the baby and possibly loss of the fetus by miscarriage or stillbirth.  Avoid all smoking, herbs, alcohol, and medicines not prescribed by  your health care provider. Chemicals in these affect the formation and growth of the baby.  Do not use any tobacco products, including cigarettes, chewing tobacco, and electronic cigarettes. If you need help quitting, ask your health care provider. You may receive counseling support and other resources to help you quit.  Schedule a dentist appointment. At home, brush your teeth with a soft toothbrush and be gentle when you floss. SEEK MEDICAL CARE IF:   You have dizziness.  You have mild pelvic cramps, pelvic pressure, or nagging pain in the abdominal area.  You have persistent nausea, vomiting, or diarrhea.  You have a bad smelling vaginal discharge.  You have pain with urination.  You notice increased swelling in your face, hands, legs, or ankles. SEEK IMMEDIATE MEDICAL CARE IF:   You have a fever.  You are leaking fluid from your vagina.  You have spotting or bleeding from your vagina.  You have severe abdominal cramping or pain.  You have rapid weight gain or loss.  You vomit blood or material that looks like coffee grounds.  You are exposed to German measles and have never had them.  You are exposed to fifth disease or chickenpox.  You develop a severe headache.  You have shortness of breath.  You have any kind of trauma, such as from a fall or a car accident.   This information is not intended to replace advice given to you by your health care provider. Make sure you discuss any questions you have with your health care provider.   Document Released: 06/12/2001 Document Revised: 07/09/2014 Document Reviewed: 04/28/2013 Elsevier Interactive Patient Education 2016 [employer].  

## 2015-09-12 ENCOUNTER — Other Ambulatory Visit (INDEPENDENT_AMBULATORY_CARE_PROVIDER_SITE_OTHER): Payer: 59 | Admitting: *Deleted

## 2015-09-12 DIAGNOSIS — Z3201 Encounter for pregnancy test, result positive: Secondary | ICD-10-CM

## 2015-09-12 LAB — HCG, QUANTITATIVE, PREGNANCY: hCG, Beta Chain, Quant, S: 575.1 m[IU]/mL — ABNORMAL HIGH

## 2015-09-12 NOTE — Progress Notes (Signed)
Pt came in last week for IUD replacement, had + UPT in office, here today for BHCG per Dr Kennon Rounds order.

## 2015-09-13 ENCOUNTER — Telehealth: Payer: Self-pay | Admitting: *Deleted

## 2015-09-13 NOTE — Telephone Encounter (Signed)
Informed pt of BHCG result, will follow-up on Wednesday for repeat BHCG.

## 2015-09-14 ENCOUNTER — Inpatient Hospital Stay (HOSPITAL_COMMUNITY)
Admission: AD | Admit: 2015-09-14 | Discharge: 2015-09-14 | Disposition: A | Discharge status: Home / Self Care | Payer: 59 | Source: Ambulatory Visit | Attending: Obstetrics and Gynecology | Admitting: Obstetrics and Gynecology

## 2015-09-14 ENCOUNTER — Other Ambulatory Visit: Payer: 59

## 2015-09-14 ENCOUNTER — Inpatient Hospital Stay (HOSPITAL_COMMUNITY): Payer: 59

## 2015-09-14 ENCOUNTER — Ambulatory Visit: Payer: 59 | Admitting: Obstetrics & Gynecology

## 2015-09-14 ENCOUNTER — Encounter (HOSPITAL_COMMUNITY): Payer: Self-pay | Admitting: Student

## 2015-09-14 DIAGNOSIS — F17210 Nicotine dependence, cigarettes, uncomplicated: Secondary | ICD-10-CM | POA: Insufficient documentation

## 2015-09-14 DIAGNOSIS — O10011 Pre-existing essential hypertension complicating pregnancy, first trimester: Secondary | ICD-10-CM | POA: Diagnosis not present

## 2015-09-14 DIAGNOSIS — O26891 Other specified pregnancy related conditions, first trimester: Secondary | ICD-10-CM | POA: Diagnosis not present

## 2015-09-14 DIAGNOSIS — O9989 Other specified diseases and conditions complicating pregnancy, childbirth and the puerperium: Secondary | ICD-10-CM | POA: Diagnosis not present

## 2015-09-14 DIAGNOSIS — R1031 Right lower quadrant pain: Secondary | ICD-10-CM | POA: Insufficient documentation

## 2015-09-14 DIAGNOSIS — O99331 Smoking (tobacco) complicating pregnancy, first trimester: Secondary | ICD-10-CM | POA: Diagnosis not present

## 2015-09-14 DIAGNOSIS — R109 Unspecified abdominal pain: Secondary | ICD-10-CM

## 2015-09-14 DIAGNOSIS — Z3A01 Less than 8 weeks gestation of pregnancy: Secondary | ICD-10-CM | POA: Diagnosis not present

## 2015-09-14 DIAGNOSIS — O26899 Other specified pregnancy related conditions, unspecified trimester: Secondary | ICD-10-CM

## 2015-09-14 LAB — CBC
HEMATOCRIT: 40.3 % (ref 36.0–46.0)
Hemoglobin: 14.2 g/dL (ref 12.0–15.0)
MCH: 32.6 pg (ref 26.0–34.0)
MCHC: 35.2 g/dL (ref 30.0–36.0)
MCV: 92.4 fL (ref 78.0–100.0)
PLATELETS: 255 10*3/uL (ref 150–400)
RBC: 4.36 MIL/uL (ref 3.87–5.11)
RDW: 13.7 % (ref 11.5–15.5)
WBC: 7.9 10*3/uL (ref 4.0–10.5)

## 2015-09-14 LAB — HCG, QUANTITATIVE, PREGNANCY: hCG, Beta Chain, Quant, S: 1320 m[IU]/mL — ABNORMAL HIGH (ref <5)

## 2015-09-14 IMAGING — US US OB TRANSVAGINAL
1 series · 15 of 28 positions shown · non-contrast
Comparison: None.

CLINICAL DATA: Abdominal pain, pregnant

EXAM:
OBSTETRIC <14 WK US AND TRANSVAGINAL OB US
TECHNIQUE: Both transabdominal and transvaginal ultrasound examinations were
performed for complete evaluation of the gestation as well as the
maternal uterus, adnexal regions, and pelvic cul-de-sac.
Transvaginal technique was performed to assess early pregnancy.

[Series 1: us ob transvaginal · 15 of 83 slices shown]
[im 1/83]
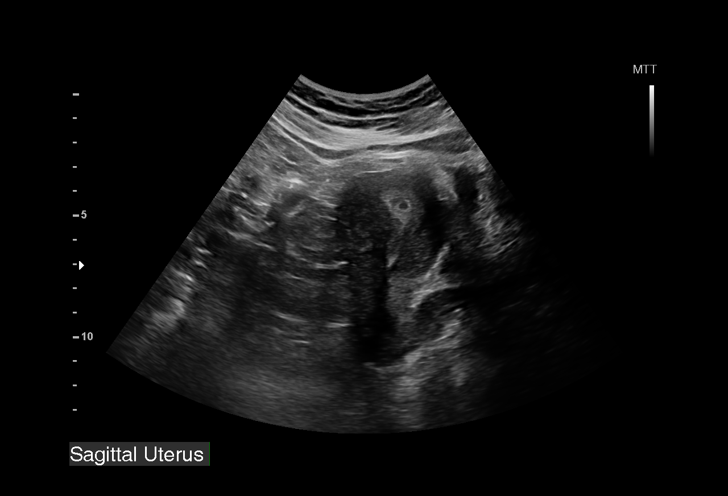
[im 7/83]
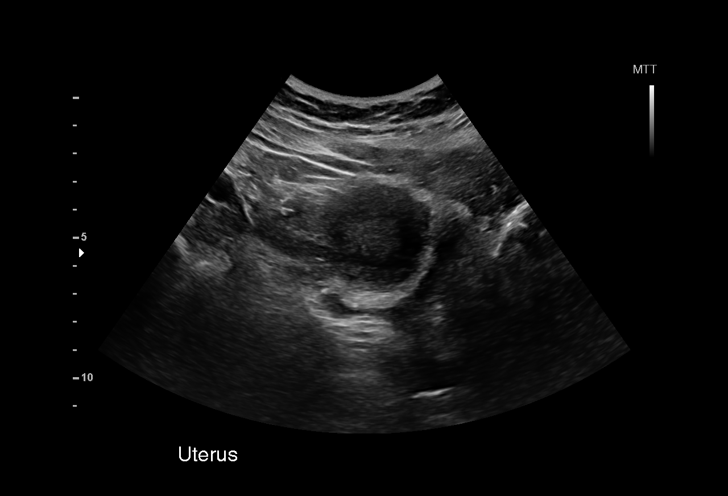
[im 13/83]
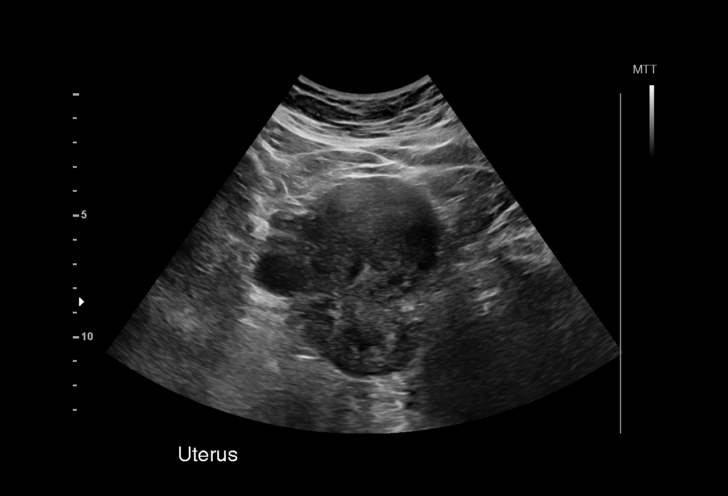
[im 19/83]
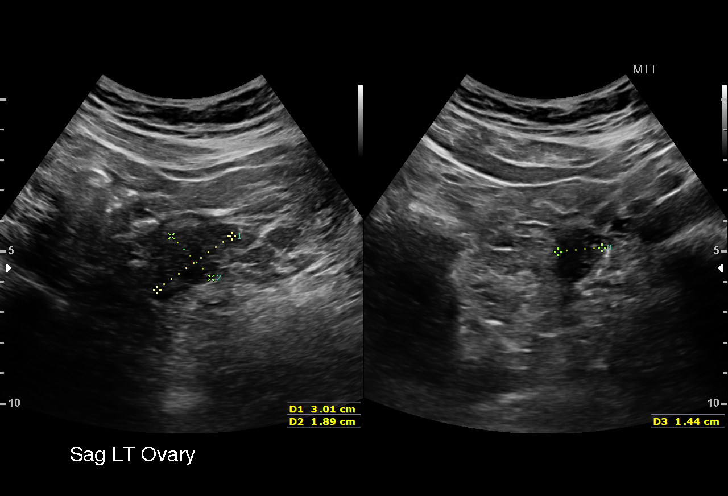
[im 25/83]
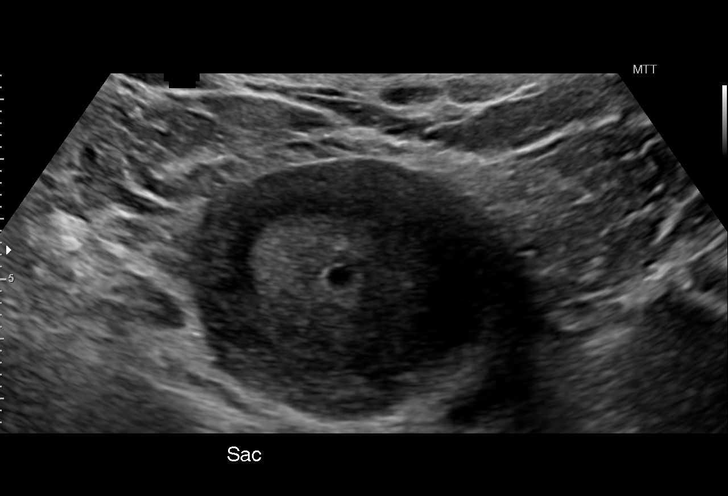
[im 31/83]
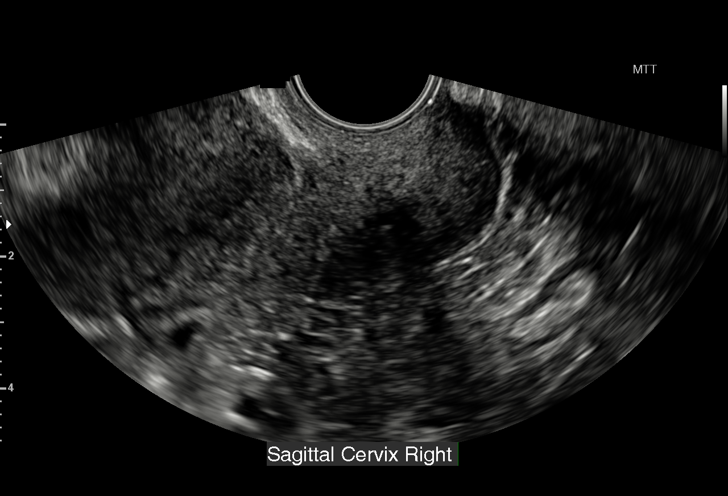
[im 37/83]
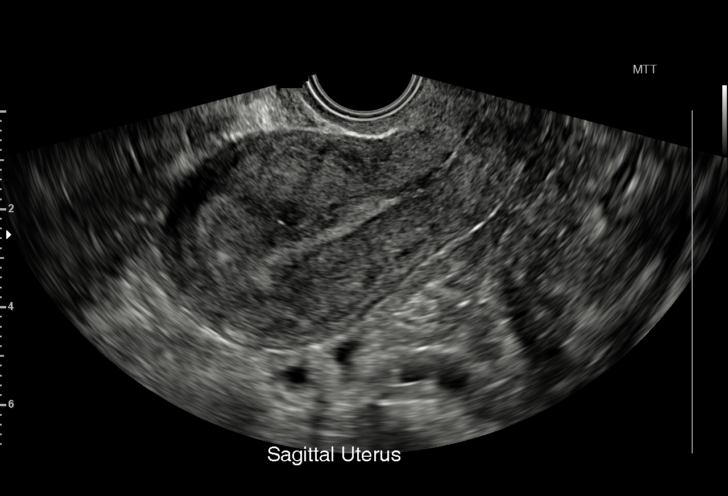
[im 43/83]
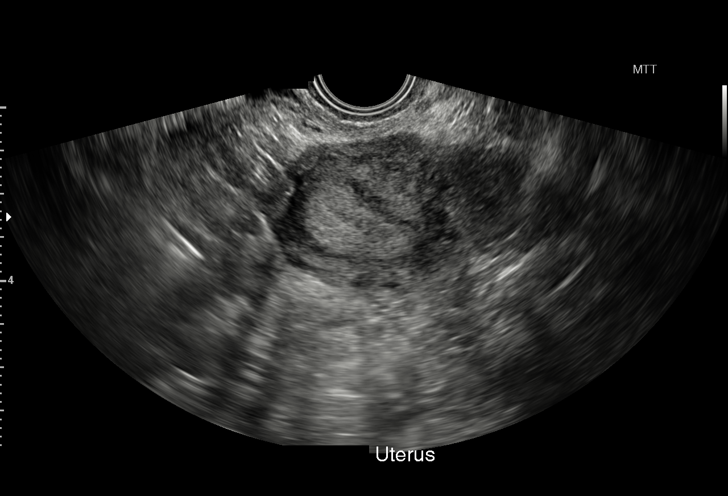
[im 46/83]
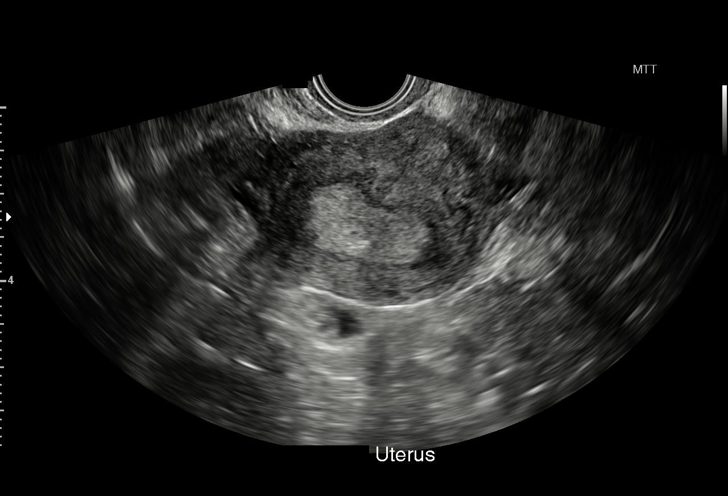
[im 52/83]
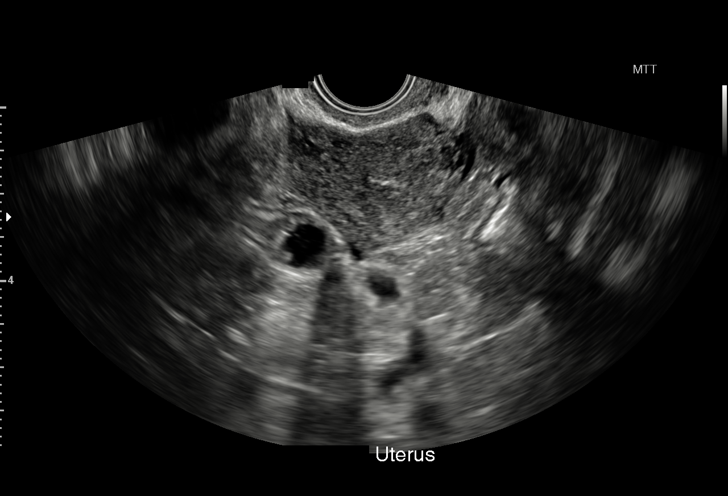
[im 58/83]
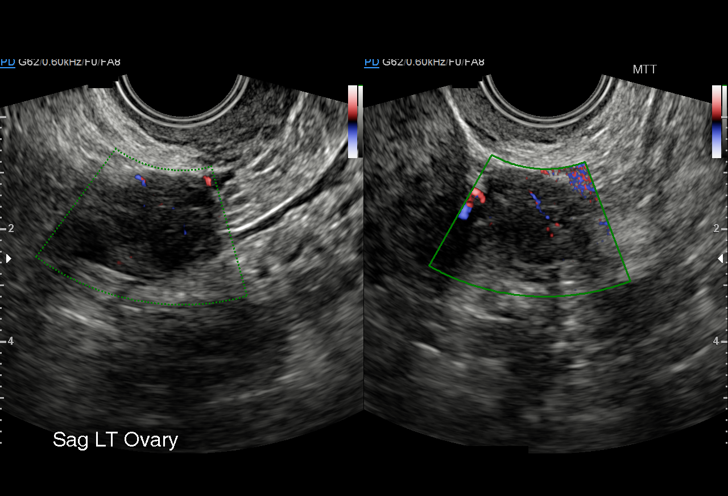
[im 64/83]
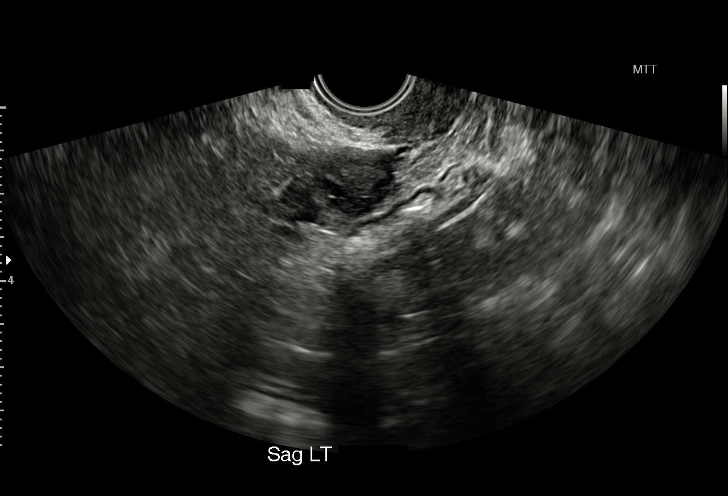
[im 70/83]
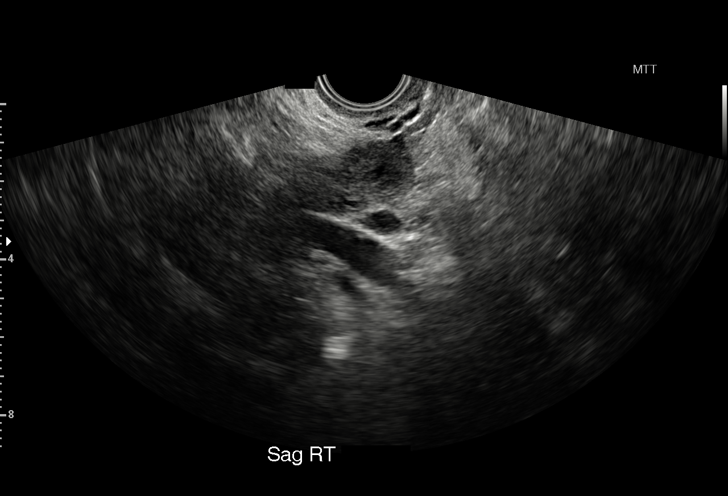
[im 76/83]
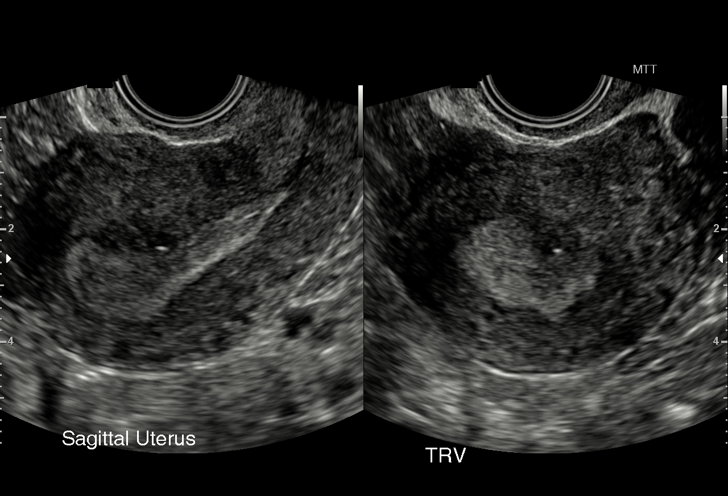
[im 83/83]
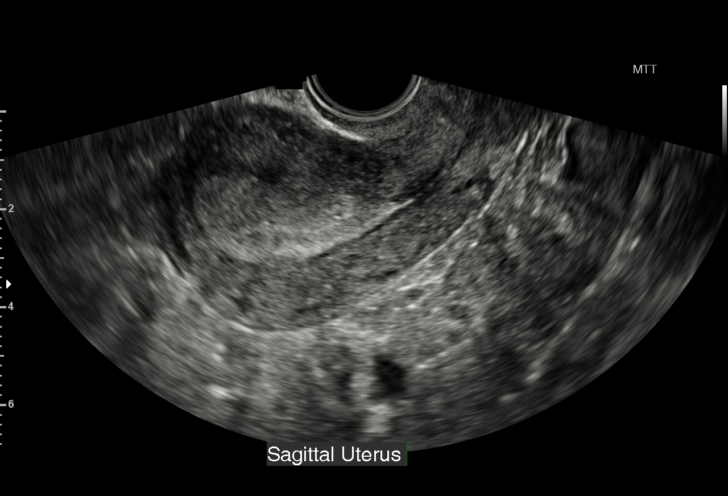

[15 of 28 positions shown; findings below may reference images not displayed]

FINDINGS: Intrauterine gestational sac: Visualized/normal in shape.

Yolk sac:  Present

Embryo:  Not visualized

Cardiac Activity: Not visualize

MSD: 3.5  mm   5 w   0  d

Subchorionic hemorrhage:  None visualized.

Maternal uterus/adnexae: No adnexal mass. Normal ovaries. 1.1 x
x 0.8 cm hypoechoic uterine mass likely representing a small
fibroid. Trace pelvic free fluid.
IMPRESSION: Probable early intrauterine gestational sac, but no fetal pole or
cardiac activity yet visualized. Recommend follow-up quantitative
B-HCG levels and follow-up US in 14 days to confirm and assess
viability. This recommendation follows SRU consensus guidelines:
Diagnostic Criteria for Nonviable Pregnancy Early in the First
Trimester. N Engl J Med [7R]; [DATE].

## 2015-09-14 NOTE — Discharge Instructions (Signed)

## 2015-09-14 NOTE — MAU Note (Signed)
Urine in lab 

## 2015-09-14 NOTE — MAU Note (Signed)
Was at work this morning. Started having some pain in RLQ. Marland Kitchen Has been having a lot of back pain. No bleeding.

## 2015-09-14 NOTE — MAU Provider Note (Signed)
CC: RLQ pain  HPI: [name] is a 35yo [id number] at [redacted]w[redacted]d by LMP who presents with RLQ pain. She works at [employer] and states that the pain began when she was moving heavy boxes this morning. The pain has a stabbing quality and began as a 5/10 but now has decreased to a 3/10. She states that it feels similar in quality to a cyst rupture, which she last experienced about 5 yrs ago, but is not as painful. She has also had occasional back pain that began a few days ago and is relieved by Tylenol and heating pads. She denies bleeding, discharge, fever, and chills. She has had occasional nausea in the mornings and feels the urge to vomit whenever she brushes her teeth.   She had a positive home pregnancy test after missing a period. An ultrasound on 09/08/15 showed that her IUD had fallen out. She is concerned about potentially incorrect dating of her pregnancy.  PMH: -Ob: had therapeutic abortions in 2002 and 2003, term pregnancy complicated by gHTN in [id number], term pregnancy in 2010, miscarriage in 2012 treated w/D&C, and term pregnancy in 2014 -Gyn: no hx of STDs, no abnormal paps (last pap was on 08/10/2014 and was normal) -Benign HTN treated with atenolol (stopped taking HCTZ during pregnancy)  Medications:  Atenolol 50 mg daily  Allergies:  None  Social Hx:  Smokes 0.5 pack/day, no alcohol during pregnancy, no illicit drug use  Family Hx:  See History tab  Physical Exam: BP 131/91 mmHg  Pulse 67  Temp(Src) 97.8 F (36.6 C) (Oral)  Resp 18  Ht 5\' 4"  (1.626 m)  Wt 84.732 kg (186 lb 12.8 oz)  BMI 32.05 kg/m2  LMP 07/30/2015  General: Appears well-developed and well-nourished. No apparent distress.  HEENT: Normocephalic and atraumatic.  CV: RRR. Pulm: CTAB. Abdom: Soft, nontender. No pain on palpation. Very active bowel sounds. Neurological: Alert and oriented to person, place, and time.  Skin: Warm and dry.  Psychiatric: Normal mood and affect.   Assessment/Plan: [name] is  a 35yo [id number] at [redacted]w[redacted]d by LMP who presents with RLQ pain. The differential includes ectopic pregnancy, appendicitis, cyst rupture, gastroenteritis, round ligament pain, and muscle pain.  A CBC, abdominal and transvaginal ultrasound, and quantitative beta-hCG were ordered due to suspicion of ectopic pregnancy. Appendicitis is unlikely due to lack of pain migration and fever, absence of pain on palpation, and normal CBC. Abdominal ultrasound may rule out cyst rupture. Gastroenteritis is unlikely due to lack of GI symptoms. Round ligament pulling may cause abdominal pain during pregnancy. Lifting heavy items at work may also have caused abdominal pain due to muscle strain.  The quantitative beta-hCG analysis showed normal hormone levels. Ultrasound showed IUGS with yolk sac, normal ovaries, and no adnexal mass, ruling out ectopic pregnancy. CBC results were within normal limits. Patient will follow up with [facility] clinic for scheduled prenatal care.  Urbano Heir, Med Student 09/14/2015 10:35am

## 2015-09-14 NOTE — MAU Provider Note (Signed)
History     CSN: [id number]  Arrival date and time: 09/14/15 [id number]   First Provider Initiated Contact with Patient 09/14/15 1012      Chief Complaint  Patient presents with  . Abdominal Pain   HPI  [name] is a 35 y.o. [id number] at [redacted]w[redacted]d who presents with abdominal pain.  Onset this morning while at work after she lifted a heavy box that weighed over 100 lbs. Initially pain was 5/10; now pain is 3/10. Quality is stabbing pain that comes & goes. Has not treated. Denies vaginal bleeding or vaginal discharge.  Some nausea/vomiting while brushing teeth. Denies fever, dysuria, diarrhea, or constipation.  Being followed at [facility] office. BHCG 2 days ago was 575. She has appt there today for repeat BHCG and ultrasound but came here d/t pain.     OB History    Gravida Para Term Preterm AB TAB SAB Ectopic Multiple Living   7 3 3  0 3 2 1  0 0 3      Past Medical History  Diagnosis Date  . Hypertension   . Migraine   . Gestational diabetes     with 1st pregnancy  . Urinary tract infection     Past Surgical History  Procedure Laterality Date  . Tonsillectomy  1988  . Tubes in ears  Baby  . Dilation and evacuation  03/09/2011    Procedure: DILATATION AND EVACUATION (D&E);  Surgeon: Donnamae Jude, MD;  Location: [facility];  Service: Gynecology;  Laterality: N/A;  . Cholecystectomy N/A 11/2013    Family History  Problem Relation Age of Onset  . Diabetes Mother   . Osteoarthritis Mother   . Hypertension Mother   . Hypothyroidism Mother   . Heart disease Mother   . Hyperlipidemia Mother   . Migraines Mother   . Mental illness Mother     Depression  . Hypertension Father   . Heart failure Father   . Hepatitis Father   . Cirrhosis Father   . Hyperlipidemia Father   . Migraines Father   . Mental illness Father     Depression  . Breast cancer Maternal Aunt 70  . Cancer Sister 79    Cervical  . Heart disease Sister     Heart Stops  . Migraines Sister   .  Seizures Sister 11    unsure if this or heart problem  . Mental illness Sister     depression  . Heart disease Maternal Grandmother     Social History  Substance Use Topics  . Smoking status: Current Some Day Smoker -- 0.50 packs/day for 12 years    Types: Cigarettes  . Smokeless tobacco: Never Used  . Alcohol Use: 0.5 oz/week    1 Standard drinks or equivalent per week    Allergies: No Known Allergies  No prescriptions prior to admission    Review of Systems  Constitutional: Negative.   Gastrointestinal: Positive for nausea, vomiting and abdominal pain. Negative for diarrhea and constipation.  Genitourinary: Negative.   Musculoskeletal: Positive for back pain.   Physical Exam   Blood pressure 135/94, pulse 70, temperature 97.8 F (36.6 C), temperature source Oral, resp. rate 18, height 5\' 4"  (1.626 m), weight 186 lb 12.8 oz (84.732 kg), last menstrual period 07/30/2015, unknown if currently breastfeeding.  Physical Exam  Nursing note and vitals reviewed. Constitutional: She is oriented to person, place, and time. She appears well-developed and well-nourished. No distress.  HENT:  Head: Normocephalic and atraumatic.  Eyes: Conjunctivae are normal. Right eye exhibits no discharge. Left eye exhibits no discharge. No scleral icterus.  Neck: Normal range of motion.  Cardiovascular: Normal rate, regular rhythm and normal heart sounds.   No murmur heard. Respiratory: Effort normal and breath sounds normal. No respiratory distress. She has no wheezes.  GI: Soft. Bowel sounds are normal. She exhibits no distension. There is no tenderness.  Neurological: She is alert and oriented to person, place, and time.  Skin: Skin is warm and dry. She is not diaphoretic.  Psychiatric: She has a normal mood and affect. Her behavior is normal. Judgment and thought content normal.    MAU Course  Procedures Results for orders placed or performed during the hospital encounter of 09/14/15  (from the past 24 hour(s))  CBC     Status: None   Collection Time: 09/14/15  9:38 AM  Result Value Ref Range   WBC 7.9 4.0 - 10.5 K/uL   RBC 4.36 3.87 - 5.11 MIL/uL   Hemoglobin 14.2 12.0 - 15.0 g/dL   HCT 40.3 36.0 - 46.0 %   MCV 92.4 78.0 - 100.0 fL   MCH 32.6 26.0 - 34.0 pg   MCHC 35.2 30.0 - 36.0 g/dL   RDW 13.7 11.5 - 15.5 %   Platelets 255 150 - 400 K/uL  hCG, quantitative, pregnancy     Status: Abnormal   Collection Time: 09/14/15  9:38 AM  Result Value Ref Range   hCG, Beta Chain, Quant, S 1320 (H) <5 mIU/mL   US Ob Comp Less 14 Wks  09/14/2015  CLINICAL DATA:  Abdominal pain, pregnant EXAM: OBSTETRIC <14 WK [place] AND TRANSVAGINAL OB US TECHNIQUE: Both transabdominal and transvaginal ultrasound examinations were performed for complete evaluation of the gestation as well as the maternal uterus, adnexal regions, and pelvic cul-de-sac. Transvaginal technique was performed to assess early pregnancy. COMPARISON:  None. FINDINGS: Intrauterine gestational sac: Visualized/normal in shape. Yolk sac:  Present Embryo:  Not visualized Cardiac Activity: Not visualize MSD: 3.5  mm   5 w   0  d Subchorionic hemorrhage:  None visualized. Maternal uterus/adnexae: No adnexal mass. Normal ovaries. 1.1 x 0.8 x 0.8 cm hypoechoic uterine mass likely representing a small fibroid. Trace pelvic free fluid. IMPRESSION: Probable early intrauterine gestational sac, but no fetal pole or cardiac activity yet visualized. Recommend follow-up quantitative B-HCG levels and follow-up US in 14 days to confirm and assess viability. This recommendation follows SRU consensus guidelines: Diagnostic Criteria for Nonviable Pregnancy Early in the First Trimester. [place] Med [id number]. Electronically Signed   By: Kathreen Devoid   On: 09/14/2015 11:36   US Ob Transvaginal  09/14/2015  CLINICAL DATA:  Abdominal pain, pregnant EXAM: OBSTETRIC <14 WK [place] AND TRANSVAGINAL OB US TECHNIQUE: Both transabdominal and transvaginal  ultrasound examinations were performed for complete evaluation of the gestation as well as the maternal uterus, adnexal regions, and pelvic cul-de-sac. Transvaginal technique was performed to assess early pregnancy. COMPARISON:  None. FINDINGS: Intrauterine gestational sac: Visualized/normal in shape. Yolk sac:  Present Embryo:  Not visualized Cardiac Activity: Not visualize MSD: 3.5  mm   5 w   0  d Subchorionic hemorrhage:  None visualized. Maternal uterus/adnexae: No adnexal mass. Normal ovaries. 1.1 x 0.8 x 0.8 cm hypoechoic uterine mass likely representing a small fibroid. Trace pelvic free fluid. IMPRESSION: Probable early intrauterine gestational sac, but no fetal pole or cardiac activity yet visualized. Recommend follow-up quantitative B-HCG levels and follow-up US in 14 days  to confirm and assess viability. This recommendation follows SRU consensus guidelines: Diagnostic Criteria for Nonviable Pregnancy Early in the First Trimester. [place] [employer] Med [id number]. Electronically Signed   By: Kathreen Devoid   On: 09/14/2015 11:36    MDM AB positive Appropriate rise in BHCG Ultrasound shows IUGS with yolk sac Pt's pain has resolved.  Assessment and Plan  A: 1. Abdominal pain in pregnancy     P: Discharge home Discussed reasons to return to MAU Keep prenatal appt with [place] 09/14/2015, 10:27 AM

## 2015-09-16 ENCOUNTER — Telehealth: Payer: Self-pay | Admitting: *Deleted

## 2015-09-16 NOTE — Telephone Encounter (Signed)
Pt called adv went to hospital and had BHCG completed and levels doubled. OB US complete shows [redacted]w[redacted]d. Pt has new OB appt 3/31. Adv pt to wait until new OB appt to come in as we should be able to see more on [place] at that time. Pt expressed understanding.

## 2015-09-29 ENCOUNTER — Ambulatory Visit (INDEPENDENT_AMBULATORY_CARE_PROVIDER_SITE_OTHER): Payer: 59 | Admitting: Family Medicine

## 2015-09-29 ENCOUNTER — Encounter: Payer: Self-pay | Admitting: Family Medicine

## 2015-09-29 VITALS — BP 126/83 | HR 76 | Wt 188.0 lb

## 2015-09-29 DIAGNOSIS — Z113 Encounter for screening for infections with a predominantly sexual mode of transmission: Secondary | ICD-10-CM

## 2015-09-29 DIAGNOSIS — O0990 Supervision of high risk pregnancy, unspecified, unspecified trimester: Secondary | ICD-10-CM | POA: Insufficient documentation

## 2015-09-29 DIAGNOSIS — Z36 Encounter for antenatal screening of mother: Secondary | ICD-10-CM | POA: Diagnosis not present

## 2015-09-29 DIAGNOSIS — O10019 Pre-existing essential hypertension complicating pregnancy, unspecified trimester: Secondary | ICD-10-CM | POA: Insufficient documentation

## 2015-09-29 DIAGNOSIS — Z23 Encounter for immunization: Secondary | ICD-10-CM

## 2015-09-29 DIAGNOSIS — O0991 Supervision of high risk pregnancy, unspecified, first trimester: Secondary | ICD-10-CM | POA: Diagnosis not present

## 2015-09-29 DIAGNOSIS — O09529 Supervision of elderly multigravida, unspecified trimester: Secondary | ICD-10-CM | POA: Insufficient documentation

## 2015-09-29 DIAGNOSIS — O09521 Supervision of elderly multigravida, first trimester: Secondary | ICD-10-CM

## 2015-09-29 DIAGNOSIS — O10011 Pre-existing essential hypertension complicating pregnancy, first trimester: Secondary | ICD-10-CM

## 2015-09-29 NOTE — Progress Notes (Signed)
Transvaginal US performed, Single IUP seen with + FHR and CRL measuring 6w 5d with an EDD 05-19-16.  Pt c/o one episode of light red spotting noticed when wiping.

## 2015-09-29 NOTE — Progress Notes (Signed)
Subjective:    [name] is a [id number] 20w5dbeing seen today for her first obstetrical visit.  Her obstetrical history is significant for advanced maternal age, smoker and hypertension. Pregnancy history fully reviewed.  Patient reports bleeding.  There were no vitals filed for this visit.  HISTORY: OB History  Gravida Para Term Preterm AB SAB TAB Ectopic Multiple Living  _0 0 _1 0 0 3    # Outcome Date GA Lbr Len/2nd Weight Sex Delivery Anes PTL Lv  7 Current           6 Term 03/01/13 332w6d5:51 / 00:08 7 lb 2.3 oz (3.24 kg) M Vag-Spont EPI  Y  5 Term 07/28/08 4075w0d:00 6 lb 13 oz (3.09 kg) M Vag-Spont EPI N Y  4 Term 10/07/06 40w32w0d00 6 lb 2 oz (2.778 kg) M Vag-Spont EPI N Y  3 TAB 2003          2 TAB 2002          1 SAB              Comments: D & C     Past Medical History  Diagnosis Date  . Hypertension   . Migraine   . Gestational diabetes     with 1st pregnancy  . Urinary tract infection    Past Surgical History  Procedure Laterality Date  . Tonsillectomy  1988  . Tubes in ears  Baby  . Dilation and evacuation  03/09/2011    Procedure: DILATATION AND EVACUATION (D&E);  Surgeon: TanyDonnamae Jude;  [facility];  Service: Gynecology;  Laterality: N/A;  . Cholecystectomy N/A 11/2013   Family History  Problem Relation Age of Onset  . Diabetes Mother   . Osteoarthritis Mother   . Hypertension Mother   . Hypothyroidism Mother   . Heart disease Mother   . Hyperlipidemia Mother   . Migraines Mother   . Mental illness Mother     Depression  . Hypertension Father   . Heart failure Father   . Hepatitis Father   . Cirrhosis Father   . Hyperlipidemia Father   . Migraines Father   . Mental illness Father     Depression  . Breast cancer Maternal Aunt 70  . Cancer Sister 23  87Cervical  . Heart disease Sister     Heart Stops  . Migraines Sister   . Seizures Sister 11    unsure if this or heart problem  . Mental illness Sister    depression  . Heart disease Maternal Grandmother      Exam    Uterus:   6 wk size  Pelvic Exam:    Perineum: Normal Perineum   Vulva: normal   Skin: normal coloration and turgor, no rashes    Neurologic: oriented   Extremities: normal strength, tone, and muscle mass   HEENT extra ocular movement intact and sclera clear, anicteric   Mouth/Teeth mucous membranes moist, pharynx normal without lesions and dental hygiene good   Neck supple   Cardiovascular: regular rate and rhythm, no murmurs or gallops   Respiratory:  appears well, vitals normal, no respiratory distress, acyanotic, normal RR, ear and throat exam is normal, neck free of mass or lymphadenopathy, chest clear, no wheezing, crepitations, rhonchi, normal symmetric air entry   Abdomen: soft, non-tender; bowel sounds normal; no masses,  no organomegaly     TVUS - SIUP with + flicker  and normal yolk sac Assessment/Plan:  1. Supervision of high risk pregnancy, antepartum, first trimester Routine labs - Prenatal Profile - Culture, OB Urine - Urine cytology ancillary only - Flu Vaccine QUAD 36+ mos IM - [place] bedside; Future  2. Hypertension in pregnancy, essential, antepartum, first trimester Baseline labs  - Protein / creatinine ratio, urine - Comp Met (CMET)  3. AMA (advanced maternal age) multigravida 30+, first trimester Panorama at 10+ wks.   Malonie Tatum S 09/29/2015

## 2015-09-29 NOTE — Patient Instructions (Signed)
First Trimester of Pregnancy The first trimester of pregnancy is from week 1 until the end of week 12 (months 1 through 3). A week after a sperm fertilizes an egg, the egg will implant on the wall of the uterus. This embryo will begin to develop into a baby. Genes from you and your partner are forming the baby. The female genes determine whether the baby is a boy or a girl. At 6-8 weeks, the eyes and face are formed, and the heartbeat can be seen on ultrasound. At the end of 12 weeks, all the baby's organs are formed.  Now that you are pregnant, you will want to do everything you can to have a healthy baby. Two of the most important things are to get good prenatal care and to follow your health care provider's instructions. Prenatal care is all the medical care you receive before the baby's birth. This care will help prevent, find, and treat any problems during the pregnancy and childbirth. BODY CHANGES Your body goes through many changes during pregnancy. The changes vary from woman to woman.   You may gain or lose a couple of pounds at first.  You may feel sick to your stomach (nauseous) and throw up (vomit). If the vomiting is uncontrollable, call your health care provider.  You may tire easily.  You may develop headaches that can be relieved by medicines approved by your health care provider.  You may urinate more often. Painful urination may mean you have a bladder infection.  You may develop heartburn as a result of your pregnancy.  You may develop constipation because certain hormones are causing the muscles that push waste through your intestines to slow down.  You may develop hemorrhoids or swollen, bulging veins (varicose veins).  Your breasts may begin to grow larger and become tender. Your nipples may stick out more, and the tissue that surrounds them (areola) may become darker.  Your gums may bleed and may be sensitive to brushing and flossing.  Dark spots or blotches  (chloasma, mask of pregnancy) may develop on your face. This will likely fade after the baby is born.  Your menstrual periods will stop.  You may have a loss of appetite.  You may develop cravings for certain kinds of food.  You may have changes in your emotions from day to day, such as being excited to be pregnant or being concerned that something may go wrong with the pregnancy and baby.  You may have more vivid and strange dreams.  You may have changes in your hair. These can include thickening of your hair, rapid growth, and changes in texture. Some women also have hair loss during or after pregnancy, or hair that feels dry or thin. Your hair will most likely return to normal after your baby is born. WHAT TO EXPECT AT YOUR PRENATAL VISITS During a routine prenatal visit:  You will be weighed to make sure you and the baby are growing normally.  Your blood pressure will be taken.  Your abdomen will be measured to track your baby's growth.  The fetal heartbeat will be listened to starting around week 10 or 12 of your pregnancy.  Test results from any previous visits will be discussed. Your health care provider may ask you:  How you are feeling.  If you are feeling the baby move.  If you have had any abnormal symptoms, such as leaking fluid, bleeding, severe headaches, or abdominal cramping.  If you are using any tobacco products,   including cigarettes, chewing tobacco, and electronic cigarettes.  If you have any questions. Other tests that may be performed during your first trimester include:  Blood tests to find your blood type and to check for the presence of any previous infections. They will also be used to check for low iron levels (anemia) and Rh antibodies. Later in the pregnancy, blood tests for diabetes will be done along with other tests if problems develop.  Urine tests to check for infections, diabetes, or protein in the urine.  An ultrasound to confirm the  proper growth and development of the baby.  An amniocentesis to check for possible genetic problems.  Fetal screens for spina bifida and Down syndrome.  You may need other tests to make sure you and the baby are doing well.  HIV (human immunodeficiency virus) testing. Routine prenatal testing includes screening for HIV, unless you choose not to have this test. HOME CARE INSTRUCTIONS  Medicines  Follow your health care provider's instructions regarding medicine use. Specific medicines may be either safe or unsafe to take during pregnancy.  Take your prenatal vitamins as directed.  If you develop constipation, try taking a stool softener if your health care provider approves. Diet  Eat regular, well-balanced meals. Choose a variety of foods, such as meat or vegetable-based protein, fish, milk and low-fat dairy products, vegetables, fruits, and whole grain breads and cereals. Your health care provider will help you determine the amount of weight gain that is right for you.  Avoid raw meat and uncooked cheese. These carry germs that can cause birth defects in the baby.  Eating four or five small meals rather than three large meals a day may help relieve nausea and vomiting. If you start to feel nauseous, eating a few soda crackers can be helpful. Drinking liquids between meals instead of during meals also seems to help nausea and vomiting.  If you develop constipation, eat more high-fiber foods, such as fresh vegetables or fruit and whole grains. Drink enough fluids to keep your urine clear or pale yellow. Activity and Exercise  Exercise only as directed by your health care provider. Exercising will help you:  Control your weight.  Stay in shape.  Be prepared for labor and delivery.  Experiencing pain or cramping in the lower abdomen or low back is a good sign that you should stop exercising. Check with your health care provider before continuing normal exercises.  Try to avoid  standing for long periods of time. Move your legs often if you must stand in one place for a long time.  Avoid heavy lifting.  Wear low-heeled shoes, and practice good posture.  You may continue to have sex unless your health care provider directs you otherwise. Relief of Pain or Discomfort  Wear a good support bra for breast tenderness.   Take warm sitz baths to soothe any pain or discomfort caused by hemorrhoids. Use hemorrhoid cream if your health care provider approves.   Rest with your legs elevated if you have leg cramps or low back pain.  If you develop varicose veins in your legs, wear support hose. Elevate your feet for 15 minutes, 3-4 times a day. Limit salt in your diet. Prenatal Care  Schedule your prenatal visits by the twelfth week of pregnancy. They are usually scheduled monthly at first, then more often in the last 2 months before delivery.  Write down your questions. Take them to your prenatal visits.  Keep all your prenatal visits as directed by your   your health care provider. Safety  Wear your seat belt at all times when driving.  Make a list of emergency phone numbers, including numbers for family, friends, the hospital, and police and fire departments. General Tips  Ask your health care provider for a referral to a local prenatal education class. Begin classes no later than at the beginning of month 6 of your pregnancy.  Ask for help if you have counseling or nutritional needs during pregnancy. Your health care provider can offer advice or refer you to specialists for help with various needs.  Do not use hot tubs, steam rooms, or saunas.  Do not douche or use tampons or scented sanitary pads.  Do not cross your legs for long periods of time.  Avoid cat litter boxes and soil used by cats. These carry germs that can cause birth defects in the baby and possibly loss of the fetus by miscarriage or stillbirth.  Avoid all smoking, herbs, alcohol, and medicines  not prescribed by your health care provider. Chemicals in these affect the formation and growth of the baby.  Do not use any tobacco products, including cigarettes, chewing tobacco, and electronic cigarettes. If you need help quitting, ask your health care provider. You may receive counseling support and other resources to help you quit.  Schedule a dentist appointment. At home, brush your teeth with a soft toothbrush and be gentle when you floss. SEEK MEDICAL CARE IF:   You have dizziness.  You have mild pelvic cramps, pelvic pressure, or nagging pain in the abdominal area.  You have persistent nausea, vomiting, or diarrhea.  You have a bad smelling vaginal discharge.  You have pain with urination.  You notice increased swelling in your face, hands, legs, or ankles. SEEK IMMEDIATE MEDICAL CARE IF:   You have a fever.  You are leaking fluid from your vagina.  You have spotting or bleeding from your vagina.  You have severe abdominal cramping or pain.  You have rapid weight gain or loss.  You vomit blood or material that looks like coffee grounds.  You are exposed to German measles and have never had them.  You are exposed to fifth disease or chickenpox.  You develop a severe headache.  You have shortness of breath.  You have any kind of trauma, such as from a fall or a car accident.   This information is not intended to replace advice given to you by your health care provider. Make sure you discuss any questions you have with your health care provider.   Document Released: 06/12/2001 Document Revised: 07/09/2014 Document Reviewed: 04/28/2013 Elsevier Interactive Patient Education 2016 [employer]   Breastfeeding Deciding to breastfeed is one of the best choices you can make for you and your baby. A change in hormones during pregnancy causes your breast tissue to grow and increases the number and size of your milk ducts. These hormones also allow proteins, sugars,  and fats from your blood supply to make breast milk in your milk-producing glands. Hormones prevent breast milk from being released before your baby is born as well as prompt milk flow after birth. Once breastfeeding has begun, thoughts of your baby, as well as his or her sucking or crying, can stimulate the release of milk from your milk-producing glands.  BENEFITS OF BREASTFEEDING For Your Baby  Your first milk (colostrum) helps your baby's digestive system function better.  There are antibodies in your milk that help your baby fight off infections.  Your baby has   a lower incidence of asthma, allergies, and sudden infant death syndrome.  The nutrients in breast milk are better for your baby than infant formulas and are designed uniquely for your baby's needs.  Breast milk improves your baby's brain development.  Your baby is less likely to develop other conditions, such as childhood obesity, asthma, or type 2 diabetes mellitus. For You  Breastfeeding helps to create a very special bond between you and your baby.  Breastfeeding is convenient. Breast milk is always available at the correct temperature and costs nothing.  Breastfeeding helps to burn calories and helps you lose the weight gained during pregnancy.  Breastfeeding makes your uterus contract to its prepregnancy size faster and slows bleeding (lochia) after you give birth.   Breastfeeding helps to lower your risk of developing type 2 diabetes mellitus, osteoporosis, and breast or ovarian cancer later in life. SIGNS THAT YOUR BABY IS HUNGRY Early Signs of Hunger  Increased alertness or activity.  Stretching.  Movement of the head from side to side.  Movement of the head and opening of the mouth when the corner of the mouth or cheek is stroked (rooting).  Increased sucking sounds, smacking lips, cooing, sighing, or squeaking.  Hand-to-mouth movements.  Increased sucking of fingers or hands. Late Signs of  Hunger  Fussing.  Intermittent crying. Extreme Signs of Hunger Signs of extreme hunger will require calming and consoling before your baby will be able to breastfeed successfully. Do not wait for the following signs of extreme hunger to occur before you initiate breastfeeding:  Restlessness.  A loud, strong cry.  Screaming. BREASTFEEDING BASICS Breastfeeding Initiation  Find a comfortable place to sit or lie down, with your neck and back well supported.  Place a pillow or rolled up blanket under your baby to bring him or her to the level of your breast (if you are seated). Nursing pillows are specially designed to help support your arms and your baby while you breastfeed.  Make sure that your baby's abdomen is facing your abdomen.  Gently massage your breast. With your fingertips, massage from your chest wall toward your nipple in a circular motion. This encourages milk flow. You may need to continue this action during the feeding if your milk flows slowly.  Support your breast with 4 fingers underneath and your thumb above your nipple. Make sure your fingers are well away from your nipple and your baby's mouth.  Stroke your baby's lips gently with your finger or nipple.  When your baby's mouth is open wide enough, quickly bring your baby to your breast, placing your entire nipple and as much of the colored area around your nipple (areola) as possible into your baby's mouth.  More areola should be visible above your baby's upper lip than below the lower lip.  Your baby's tongue should be between his or her lower gum and your breast.  Ensure that your baby's mouth is correctly positioned around your nipple (latched). Your baby's lips should create a seal on your breast and be turned out (everted).  It is common for your baby to suck about 2-3 minutes in order to start the flow of breast milk. Latching Teaching your baby how to latch on to your breast properly is very important.  An improper latch can cause nipple pain and decreased milk supply for you and poor weight gain in your baby. Also, if your baby is not latched onto your nipple properly, he or she may swallow some air during feeding. This   can make your baby fussy. Burping your baby when you switch breasts during the feeding can help to get rid of the air. However, teaching your baby to latch on properly is still the best way to prevent fussiness from swallowing air while breastfeeding. Signs that your baby has successfully latched on to your nipple:  Silent tugging or silent sucking, without causing you pain.  Swallowing heard between every 3-4 sucks.  Muscle movement above and in front of his or her ears while sucking. Signs that your baby has not successfully latched on to nipple:  Sucking sounds or smacking sounds from your baby while breastfeeding.  Nipple pain. If you think your baby has not latched on correctly, slip your finger into the corner of your baby's mouth to break the suction and place it between your baby's gums. Attempt breastfeeding initiation again. Signs of Successful Breastfeeding Signs from your baby:  A gradual decrease in the number of sucks or complete cessation of sucking.  Falling asleep.  Relaxation of his or her body.  Retention of a small amount of milk in his or her mouth.  Letting go of your breast by himself or herself. Signs from you:  Breasts that have increased in firmness, weight, and size 1-3 hours after feeding.  Breasts that are softer immediately after breastfeeding.  Increased milk volume, as well as a change in milk consistency and color by the fifth day of breastfeeding.  Nipples that are not sore, cracked, or bleeding. Signs That Your Baby is Getting Enough Milk  Wetting at least 3 diapers in a 24-hour period. The urine should be clear and pale yellow by age 5 days.  At least 3 stools in a 24-hour period by age 5 days. The stool should be soft and  yellow.  At least 3 stools in a 24-hour period by age 7 days. The stool should be seedy and yellow.  No loss of weight greater than 10% of birth weight during the first 3 days of age.  Average weight gain of 4-7 ounces (113-198 g) per week after age 4 days.  Consistent daily weight gain by age 5 days, without weight loss after the age of 2 weeks. After a feeding, your baby may spit up a small amount. This is common. BREASTFEEDING FREQUENCY AND DURATION Frequent feeding will help you make more milk and can prevent sore nipples and breast engorgement. Breastfeed when you feel the need to reduce the fullness of your breasts or when your baby shows signs of hunger. This is called "breastfeeding on demand." Avoid introducing a pacifier to your baby while you are working to establish breastfeeding (the first 4-6 weeks after your baby is born). After this time you may choose to use a pacifier. Research has shown that pacifier use during the first year of a baby's life decreases the risk of sudden infant death syndrome (SIDS). Allow your baby to feed on each breast as long as he or she wants. Breastfeed until your baby is finished feeding. When your baby unlatches or falls asleep while feeding from the first breast, offer the second breast. Because newborns are often sleepy in the first few weeks of life, you may need to awaken your baby to get him or her to feed. Breastfeeding times will vary from baby to baby. However, the following rules can serve as a guide to help you ensure that your baby is properly fed:  Newborns (babies 4 weeks of age or younger) may breastfeed every 1-3 hours.    Newborns should not go longer than 3 hours during the day or 5 hours during the night without breastfeeding.  You should breastfeed your baby a minimum of 8 times in a 24-hour period until you begin to introduce solid foods to your baby at around 6 months of age. BREAST MILK PUMPING Pumping and storing breast milk  allows you to ensure that your baby is exclusively fed your breast milk, even at times when you are unable to breastfeed. This is especially important if you are going back to work while you are still breastfeeding or when you are not able to be present during feedings. Your lactation consultant can give you guidelines on how long it is safe to store breast milk. A breast pump is a machine that allows you to pump milk from your breast into a sterile bottle. The pumped breast milk can then be stored in a refrigerator or freezer. Some breast pumps are operated by hand, while others use electricity. Ask your lactation consultant which type will work best for you. Breast pumps can be purchased, but some hospitals and breastfeeding support groups lease breast pumps on a monthly basis. A lactation consultant can teach you how to hand express breast milk, if you prefer not to use a pump. CARING FOR YOUR BREASTS WHILE YOU BREASTFEED Nipples can become dry, cracked, and sore while breastfeeding. The following recommendations can help keep your breasts moisturized and healthy:  Avoid using soap on your nipples.  Wear a supportive bra. Although not required, special nursing bras and tank tops are designed to allow access to your breasts for breastfeeding without taking off your entire bra or top. Avoid wearing underwire-style bras or extremely tight bras.  Air dry your nipples for 3-4minutes after each feeding.  Use only cotton bra pads to absorb leaked breast milk. Leaking of breast milk between feedings is normal.  Use lanolin on your nipples after breastfeeding. Lanolin helps to maintain your skin's normal moisture barrier. If you use pure lanolin, you do not need to wash it off before feeding your baby again. Pure lanolin is not toxic to your baby. You may also hand express a few drops of breast milk and gently massage that milk into your nipples and allow the milk to air dry. In the first few weeks after  giving birth, some women experience extremely full breasts (engorgement). Engorgement can make your breasts feel heavy, warm, and tender to the touch. Engorgement peaks within 3-5 days after you give birth. The following recommendations can help ease engorgement:  Completely empty your breasts while breastfeeding or pumping. You may want to start by applying warm, moist heat (in the shower or with warm water-soaked hand towels) just before feeding or pumping. This increases circulation and helps the milk flow. If your baby does not completely empty your breasts while breastfeeding, pump any extra milk after he or she is finished.  Wear a snug bra (nursing or regular) or tank top for 1-2 days to signal your body to slightly decrease milk production.  Apply ice packs to your breasts, unless this is too uncomfortable for you.  Make sure that your baby is latched on and positioned properly while breastfeeding. If engorgement persists after 48 hours of following these recommendations, contact your health care provider or a lactation consultant. OVERALL HEALTH CARE RECOMMENDATIONS WHILE BREASTFEEDING  Eat healthy foods. Alternate between meals and snacks, eating 3 of each per day. Because what you eat affects your breast milk, some of the foods   may make your baby more irritable than usual. Avoid eating these foods if you are sure that they are negatively affecting your baby.  Drink milk, fruit juice, and water to satisfy your thirst (about 10 glasses a day).  Rest often, relax, and continue to take your prenatal vitamins to prevent fatigue, stress, and anemia.  Continue breast self-awareness checks.  Avoid chewing and smoking tobacco. Chemicals from cigarettes that pass into breast milk and exposure to secondhand smoke may harm your baby.  Avoid alcohol and drug use, including marijuana. Some medicines that may be harmful to your baby can pass through breast milk. It is important to ask your health  care provider before taking any medicine, including all over-the-counter and prescription medicine as well as vitamin and herbal supplements. It is possible to become pregnant while breastfeeding. If birth control is desired, ask your health care provider about options that will be safe for your baby. SEEK MEDICAL CARE IF:  You feel like you want to stop breastfeeding or have become frustrated with breastfeeding.  You have painful breasts or nipples.  Your nipples are cracked or bleeding.  Your breasts are red, tender, or warm.  You have a swollen area on either breast.  You have a fever or chills.  You have nausea or vomiting.  You have drainage other than breast milk from your nipples.  Your breasts do not become full before feedings by the fifth day after you give birth.  You feel sad and depressed.  Your baby is too sleepy to eat well.  Your baby is having trouble sleeping.   Your baby is wetting less than 3 diapers in a 24-hour period.  Your baby has less than 3 stools in a 24-hour period.  Your baby's skin or the white part of his or her eyes becomes yellow.   Your baby is not gaining weight by 5 days of age. SEEK IMMEDIATE MEDICAL CARE IF:  Your baby is overly tired (lethargic) and does not want to wake up and feed.  Your baby develops an unexplained fever.   This information is not intended to replace advice given to you by your health care provider. Make sure you discuss any questions you have with your health care provider.   Document Released: 06/18/2005 Document Revised: 03/09/2015 Document Reviewed: 12/10/2012 Elsevier Interactive Patient Education 2016 [employer]  

## 2015-09-30 ENCOUNTER — Encounter: Payer: Self-pay | Admitting: Family Medicine

## 2015-09-30 ENCOUNTER — Encounter: Payer: Self-pay | Admitting: *Deleted

## 2015-09-30 ENCOUNTER — Encounter: Payer: 59 | Admitting: Family Medicine

## 2015-09-30 LAB — COMPREHENSIVE METABOLIC PANEL
ALT: 37 U/L — ABNORMAL HIGH (ref 6–29)
AST: 31 U/L — ABNORMAL HIGH (ref 10–30)
Albumin: 4.6 g/dL (ref 3.6–5.1)
Alkaline Phosphatase: 70 U/L (ref 33–115)
BUN: 9 mg/dL (ref 7–25)
CHLORIDE: 101 mmol/L (ref 98–110)
CO2: 21 mmol/L (ref 20–31)
Calcium: 9.9 mg/dL (ref 8.6–10.2)
Creat: 0.53 mg/dL (ref 0.50–1.10)
GLUCOSE: 54 mg/dL — AB (ref 65–99)
POTASSIUM: 4.1 mmol/L (ref 3.5–5.3)
Sodium: 137 mmol/L (ref 135–146)
Total Bilirubin: 0.4 mg/dL (ref 0.2–1.2)
Total Protein: 7.4 g/dL (ref 6.1–8.1)

## 2015-09-30 LAB — PRENATAL PROFILE (SOLSTAS)
ANTIBODY SCREEN: NEGATIVE
Basophils Absolute: 0 10*3/uL (ref 0.0–0.1)
Basophils Relative: 0 % (ref 0–1)
EOS ABS: 0.1 10*3/uL (ref 0.0–0.7)
EOS PCT: 1 % (ref 0–5)
HCT: 39.6 % (ref 36.0–46.0)
HIV: NONREACTIVE
Hemoglobin: 13.8 g/dL (ref 12.0–15.0)
Hepatitis B Surface Ag: NEGATIVE
LYMPHS ABS: 2.9 10*3/uL (ref 0.7–4.0)
LYMPHS PCT: 31 % (ref 12–46)
MCH: 31.9 pg (ref 26.0–34.0)
MCHC: 34.8 g/dL (ref 30.0–36.0)
MCV: 91.5 fL (ref 78.0–100.0)
MONO ABS: 0.7 10*3/uL (ref 0.1–1.0)
MPV: 10.4 fL (ref 8.6–12.4)
Monocytes Relative: 8 % (ref 3–12)
Neutro Abs: 5.6 10*3/uL (ref 1.7–7.7)
Neutrophils Relative %: 60 % (ref 43–77)
PLATELETS: 258 10*3/uL (ref 150–400)
RBC: 4.33 MIL/uL (ref 3.87–5.11)
RDW: 13.7 % (ref 11.5–15.5)
RH TYPE: POSITIVE
Rubella: 16 Index — ABNORMAL HIGH (ref <0.90)
WBC: 9.3 10*3/uL (ref 4.0–10.5)

## 2015-09-30 LAB — URINE CYTOLOGY ANCILLARY ONLY
CHLAMYDIA, DNA PROBE: NEGATIVE
Neisseria Gonorrhea: NEGATIVE

## 2015-09-30 LAB — PROTEIN / CREATININE RATIO, URINE
Creatinine, Urine: 66 mg/dL (ref 20–320)
PROTEIN CREATININE RATIO: 76 mg/g{creat} (ref 21–161)
TOTAL PROTEIN, URINE: 5 mg/dL (ref 5–24)

## 2015-10-01 LAB — CULTURE, OB URINE: Colony Count: 25000

## 2015-10-06 ENCOUNTER — Other Ambulatory Visit (INDEPENDENT_AMBULATORY_CARE_PROVIDER_SITE_OTHER): Payer: 59 | Admitting: *Deleted

## 2015-10-06 DIAGNOSIS — R102 Pelvic and perineal pain: Secondary | ICD-10-CM

## 2015-10-06 DIAGNOSIS — O26899 Other specified pregnancy related conditions, unspecified trimester: Secondary | ICD-10-CM

## 2015-10-06 LAB — POCT URINALYSIS DIPSTICK
BILIRUBIN UA: NEGATIVE
Blood, UA: NEGATIVE
Glucose, UA: NEGATIVE
KETONES UA: NEGATIVE
LEUKOCYTES UA: NEGATIVE
Nitrite, UA: NEGATIVE
PROTEIN UA: NEGATIVE
SPEC GRAV UA: 1.01
Urobilinogen, UA: 0.2
pH, UA: 6

## 2015-10-06 NOTE — Progress Notes (Signed)
Pt is currently [redacted] wks pregnant, c/o mid lower pelvic pain.  Denies any bleeding.  UA WDL.  Informed pt that she may experience some pain on and off during the pregnancy due to growth and abdominal muscles being weakened from previous pregnancies.  Informed pt to call if she starts to experience any vaginal bleeding.

## 2015-10-11 ENCOUNTER — Telehealth: Payer: Self-pay | Admitting: *Deleted

## 2015-10-11 DIAGNOSIS — B370 Candidal stomatitis: Secondary | ICD-10-CM

## 2015-10-11 MED ORDER — NYSTATIN 100000 UNIT/ML MT SUSP: 5.0000 mL | Freq: Four times a day (QID) | OROMUCOSAL | Status: DC

## 2015-10-11 NOTE — Telephone Encounter (Signed)
Dr Kennon Rounds reviewed case, ordered Nystatin suspension.  Sent rx to pharmacy and instructed pt on medication use.

## 2015-10-11 NOTE — Telephone Encounter (Signed)
-----   Message from Francia Greaves sent at 10/11/2015  2:13 PM EDT ----- Regarding: Rx Request Contact: [phone] Called and says she has thrush, wants to know if we can call something in for her

## 2015-10-27 ENCOUNTER — Encounter: Payer: Self-pay | Admitting: *Deleted

## 2015-10-27 ENCOUNTER — Ambulatory Visit (INDEPENDENT_AMBULATORY_CARE_PROVIDER_SITE_OTHER): Payer: 59 | Admitting: Family Medicine

## 2015-10-27 VITALS — BP 122/81 | HR 81 | Wt 192.0 lb

## 2015-10-27 DIAGNOSIS — O0991 Supervision of high risk pregnancy, unspecified, first trimester: Secondary | ICD-10-CM

## 2015-10-27 DIAGNOSIS — O09521 Supervision of elderly multigravida, first trimester: Secondary | ICD-10-CM

## 2015-10-27 DIAGNOSIS — Z36 Encounter for antenatal screening of mother: Secondary | ICD-10-CM | POA: Diagnosis not present

## 2015-10-27 DIAGNOSIS — O10011 Pre-existing essential hypertension complicating pregnancy, first trimester: Secondary | ICD-10-CM

## 2015-10-27 NOTE — Patient Instructions (Signed)
First Trimester of Pregnancy The first trimester of pregnancy is from week 1 until the end of week 12 (months 1 through 3). A week after a sperm fertilizes an egg, the egg will implant on the wall of the uterus. This embryo will begin to develop into a baby. Genes from you and your partner are forming the baby. The female genes determine whether the baby is a boy or a girl. At 6-8 weeks, the eyes and face are formed, and the heartbeat can be seen on ultrasound. At the end of 12 weeks, all the baby's organs are formed.  Now that you are pregnant, you will want to do everything you can to have a healthy baby. Two of the most important things are to get good prenatal care and to follow your health care provider's instructions. Prenatal care is all the medical care you receive before the baby's birth. This care will help prevent, find, and treat any problems during the pregnancy and childbirth. BODY CHANGES Your body goes through many changes during pregnancy. The changes vary from woman to woman.   You may gain or lose a couple of pounds at first.  You may feel sick to your stomach (nauseous) and throw up (vomit). If the vomiting is uncontrollable, call your health care provider.  You may tire easily.  You may develop headaches that can be relieved by medicines approved by your health care provider.  You may urinate more often. Painful urination may mean you have a bladder infection.  You may develop heartburn as a result of your pregnancy.  You may develop constipation because certain hormones are causing the muscles that push waste through your intestines to slow down.  You may develop hemorrhoids or swollen, bulging veins (varicose veins).  Your breasts may begin to grow larger and become tender. Your nipples may stick out more, and the tissue that surrounds them (areola) may become darker.  Your gums may bleed and may be sensitive to brushing and flossing.  Dark spots or blotches (chloasma,  mask of pregnancy) may develop on your face. This will likely fade after the baby is born.  Your menstrual periods will stop.  You may have a loss of appetite.  You may develop cravings for certain kinds of food.  You may have changes in your emotions from day to day, such as being excited to be pregnant or being concerned that something may go wrong with the pregnancy and baby.  You may have more vivid and strange dreams.  You may have changes in your hair. These can include thickening of your hair, rapid growth, and changes in texture. Some women also have hair loss during or after pregnancy, or hair that feels dry or thin. Your hair will most likely return to normal after your baby is born. WHAT TO EXPECT AT YOUR PRENATAL VISITS During a routine prenatal visit:  You will be weighed to make sure you and the baby are growing normally.  Your blood pressure will be taken.  Your abdomen will be measured to track your baby's growth.  The fetal heartbeat will be listened to starting around week 10 or 12 of your pregnancy.  Test results from any previous visits will be discussed. Your health care provider may ask you:  How you are feeling.  If you are feeling the baby move.  If you have had any abnormal symptoms, such as leaking fluid, bleeding, severe headaches, or abdominal cramping.  If you are using any tobacco products,   including cigarettes, chewing tobacco, and electronic cigarettes.  If you have any questions. Other tests that may be performed during your first trimester include:  Blood tests to find your blood type and to check for the presence of any previous infections. They will also be used to check for low iron levels (anemia) and Rh antibodies. Later in the pregnancy, blood tests for diabetes will be done along with other tests if problems develop.  Urine tests to check for infections, diabetes, or protein in the urine.  An ultrasound to confirm the proper growth  and development of the baby.  An amniocentesis to check for possible genetic problems.  Fetal screens for spina bifida and Down syndrome.  You may need other tests to make sure you and the baby are doing well.  HIV (human immunodeficiency virus) testing. Routine prenatal testing includes screening for HIV, unless you choose not to have this test. HOME CARE INSTRUCTIONS  Medicines  Follow your health care provider's instructions regarding medicine use. Specific medicines may be either safe or unsafe to take during pregnancy.  Take your prenatal vitamins as directed.  If you develop constipation, try taking a stool softener if your health care provider approves. Diet  Eat regular, well-balanced meals. Choose a variety of foods, such as meat or vegetable-based protein, fish, milk and low-fat dairy products, vegetables, fruits, and whole grain breads and cereals. Your health care provider will help you determine the amount of weight gain that is right for you.  Avoid raw meat and uncooked cheese. These carry germs that can cause birth defects in the baby.  Eating four or five small meals rather than three large meals a day may help relieve nausea and vomiting. If you start to feel nauseous, eating a few soda crackers can be helpful. Drinking liquids between meals instead of during meals also seems to help nausea and vomiting.  If you develop constipation, eat more high-fiber foods, such as fresh vegetables or fruit and whole grains. Drink enough fluids to keep your urine clear or pale yellow. Activity and Exercise  Exercise only as directed by your health care provider. Exercising will help you:  Control your weight.  Stay in shape.  Be prepared for labor and delivery.  Experiencing pain or cramping in the lower abdomen or low back is a good sign that you should stop exercising. Check with your health care provider before continuing normal exercises.  Try to avoid standing for long  periods of time. Move your legs often if you must stand in one place for a long time.  Avoid heavy lifting.  Wear low-heeled shoes, and practice good posture.  You may continue to have sex unless your health care provider directs you otherwise. Relief of Pain or Discomfort  Wear a good support bra for breast tenderness.   Take warm sitz baths to soothe any pain or discomfort caused by hemorrhoids. Use hemorrhoid cream if your health care provider approves.   Rest with your legs elevated if you have leg cramps or low back pain.  If you develop varicose veins in your legs, wear support hose. Elevate your feet for 15 minutes, 3-4 times a day. Limit salt in your diet. Prenatal Care  Schedule your prenatal visits by the twelfth week of pregnancy. They are usually scheduled monthly at first, then more often in the last 2 months before delivery.  Write down your questions. Take them to your prenatal visits.  Keep all your prenatal visits as directed by your   health care provider. Safety  Wear your seat belt at all times when driving.  Make a list of emergency phone numbers, including numbers for family, friends, the hospital, and police and fire departments. General Tips  Ask your health care provider for a referral to a local prenatal education class. Begin classes no later than at the beginning of month 6 of your pregnancy.  Ask for help if you have counseling or nutritional needs during pregnancy. Your health care provider can offer advice or refer you to specialists for help with various needs.  Do not use hot tubs, steam rooms, or saunas.  Do not douche or use tampons or scented sanitary pads.  Do not cross your legs for long periods of time.  Avoid cat litter boxes and soil used by cats. These carry germs that can cause birth defects in the baby and possibly loss of the fetus by miscarriage or stillbirth.  Avoid all smoking, herbs, alcohol, and medicines not prescribed by  your health care provider. Chemicals in these affect the formation and growth of the baby.  Do not use any tobacco products, including cigarettes, chewing tobacco, and electronic cigarettes. If you need help quitting, ask your health care provider. You may receive counseling support and other resources to help you quit.  Schedule a dentist appointment. At home, brush your teeth with a soft toothbrush and be gentle when you floss. SEEK MEDICAL CARE IF:   You have dizziness.  You have mild pelvic cramps, pelvic pressure, or nagging pain in the abdominal area.  You have persistent nausea, vomiting, or diarrhea.  You have a bad smelling vaginal discharge.  You have pain with urination.  You notice increased swelling in your face, hands, legs, or ankles. SEEK IMMEDIATE MEDICAL CARE IF:   You have a fever.  You are leaking fluid from your vagina.  You have spotting or bleeding from your vagina.  You have severe abdominal cramping or pain.  You have rapid weight gain or loss.  You vomit blood or material that looks like coffee grounds.  You are exposed to German measles and have never had them.  You are exposed to fifth disease or chickenpox.  You develop a severe headache.  You have shortness of breath.  You have any kind of trauma, such as from a fall or a car accident.   This information is not intended to replace advice given to you by your health care provider. Make sure you discuss any questions you have with your health care provider.   Document Released: 06/12/2001 Document Revised: 07/09/2014 Document Reviewed: 04/28/2013 Elsevier Interactive Patient Education 2016 [employer].  

## 2015-10-27 NOTE — Progress Notes (Signed)
Subjective:  [name] is a 35 y.o. [id number] at [redacted]w[redacted]d being seen today for ongoing prenatal care.  She is currently monitored for the following issues for this high-risk pregnancy and has HTN (hypertension), benign; Hypertriglyceridemia; Supervision of high risk pregnancy, antepartum; Hypertension in pregnancy, essential, antepartum; and AMA (advanced maternal age) multigravida 35+ on her problem list.  Patient reports no complaints.  Contractions: Not present. Vag. Bleeding: None.  Movement: Absent. Denies leaking of fluid.   The following portions of the patient's history were reviewed and updated as appropriate: allergies, current medications, past family history, past medical history, past social history, past surgical history and problem list. Problem list updated.  Objective:   Filed Vitals:   10/27/15 1041  BP: 122/81  Pulse: 81  Weight: 192 lb (87.091 kg)    Fetal Status: Fetal Heart Rate (bpm): 156   Movement: Absent     General:  Alert, oriented and cooperative. Patient is in no acute distress.  Skin: Skin is warm and dry. No rash noted.   Cardiovascular: Normal heart rate noted  Respiratory: Normal respiratory effort, no problems with respiration noted  Abdomen: Soft, gravid, appropriate for gestational age. Pain/Pressure: Present     Pelvic: Vag. Bleeding: None     Cervical exam deferred        Extremities: Normal range of motion.  Edema: None  Mental Status: Normal mood and affect. Normal behavior. Normal judgment and thought content.   Urinalysis: Urine Protein: Negative Urine Glucose: Negative  Assessment and Plan:  Pregnancy: [id number] at [redacted]w[redacted]d  1. Supervision of high risk pregnancy, antepartum, first trimester Continue prenatal care. - Glucose Tolerance, 1 HR (50g) - early  2. AMA (advanced maternal age) multigravida 66+, first trimester Panorama  - Genetic Screening  3. Hypertension in pregnancy, essential, antepartum, first trimester Offer baby ASA at  next visit BP ok now on Tenormin   General obstetric precautions including but not limited to vaginal bleeding, contractions, leaking of fluid and fetal movement were reviewed in detail with the patient. Please refer to After Visit Summary for other counseling recommendations.  Return in 4 weeks (on 11/24/2015).   Donnamae Jude, MD

## 2015-10-27 NOTE — Progress Notes (Signed)
Did not take Nystatin due to precautions listed on label.  Denies still having thrush symptoms.

## 2015-10-28 LAB — GLUCOSE TOLERANCE, 1 HOUR (50G) W/O FASTING: GLUCOSE, 1 HR, GESTATIONAL: 108 mg/dL (ref <140)

## 2015-11-03 ENCOUNTER — Telehealth: Payer: Self-pay | Admitting: *Deleted

## 2015-11-03 ENCOUNTER — Encounter: Payer: Self-pay | Admitting: *Deleted

## 2015-11-03 NOTE — Telephone Encounter (Signed)
Called pt to inform her of Panorama result, no answer, left message to call the office.

## 2015-11-08 ENCOUNTER — Telehealth: Payer: Self-pay | Admitting: *Deleted

## 2015-11-08 NOTE — Telephone Encounter (Signed)
-----   Message from Francia Greaves sent at 11/08/2015  9:24 AM EDT ----- Regarding: Doppler Contact: [phone] Wants to know if it is too early to hear the baby's heart beat on a doppler, if not could she come in and have that done

## 2015-11-08 NOTE — Telephone Encounter (Signed)
Called pt, no answer, left message that she should be far enough along to pick up FHR on doppler and she could make appt for Thursday afternoon to have that done.

## 2015-11-15 ENCOUNTER — Other Ambulatory Visit (INDEPENDENT_AMBULATORY_CARE_PROVIDER_SITE_OTHER): Payer: 59

## 2015-11-15 VITALS — BP 118/78 | HR 87

## 2015-11-15 DIAGNOSIS — R300 Dysuria: Secondary | ICD-10-CM | POA: Diagnosis not present

## 2015-11-15 LAB — POCT URINALYSIS DIPSTICK
BILIRUBIN UA: NEGATIVE
Glucose, UA: NEGATIVE
KETONES UA: NEGATIVE
NITRITE UA: NEGATIVE
PH UA: 5
Protein, UA: NEGATIVE
RBC UA: NEGATIVE
Spec Grav, UA: 1.01
Urobilinogen, UA: NEGATIVE

## 2015-11-15 NOTE — Progress Notes (Signed)
Here today for nurse visit only with complaints of urinary frequency, feeling of full bladder when empty and burning with urination.  Urine dip shows negative blood but large leukocytes.  Sample sent for urine culture.  Instructed patient to push fluids and we will call her tomorrow the results of the culture and appropriate treatment.

## 2015-11-17 LAB — URINE CULTURE

## 2015-11-24 ENCOUNTER — Ambulatory Visit (INDEPENDENT_AMBULATORY_CARE_PROVIDER_SITE_OTHER): Payer: 59 | Admitting: Obstetrics & Gynecology

## 2015-11-24 VITALS — BP 116/75 | HR 79 | Wt 191.0 lb

## 2015-11-24 DIAGNOSIS — Z302 Encounter for sterilization: Secondary | ICD-10-CM | POA: Insufficient documentation

## 2015-11-24 DIAGNOSIS — O10012 Pre-existing essential hypertension complicating pregnancy, second trimester: Secondary | ICD-10-CM

## 2015-11-24 DIAGNOSIS — O09522 Supervision of elderly multigravida, second trimester: Secondary | ICD-10-CM

## 2015-11-24 DIAGNOSIS — O0992 Supervision of high risk pregnancy, unspecified, second trimester: Secondary | ICD-10-CM

## 2015-11-24 MED ORDER — ASPIRIN EC 81 MG PO TBEC: 81.0000 mg | DELAYED_RELEASE_TABLET | Freq: Every day | ORAL | Status: DC

## 2015-11-24 NOTE — Progress Notes (Signed)
Subjective:  [name] is a 35 y.o. [id number] at [redacted]w[redacted]d being seen today for ongoing prenatal care.  She is currently monitored for the following issues for this high-risk pregnancy and has HTN (hypertension), benign; Hypertriglyceridemia; Supervision of high risk pregnancy, antepartum; Hypertension in pregnancy, essential, antepartum; and AMA (advanced maternal age) multigravida 35+ on her problem list.  Patient reports no complaints.  Contractions: Not present. Vag. Bleeding: None.  Movement: Absent. Denies leaking of fluid.   The following portions of the patient's history were reviewed and updated as appropriate: allergies, current medications, past family history, past medical history, past social history, past surgical history and problem list. Problem list updated.  Objective:   Filed Vitals:   11/24/15 1015  BP: 116/75  Pulse: 79  Weight: 191 lb (86.637 kg)    Fetal Status: Fetal Heart Rate (bpm): 137   Movement: Absent     General:  Alert, oriented and cooperative. Patient is in no acute distress.  Skin: Skin is warm and dry. No rash noted.   Cardiovascular: Normal heart rate noted  Respiratory: Normal respiratory effort, no problems with respiration noted  Abdomen: Soft, gravid, appropriate for gestational age. Pain/Pressure: Absent     Pelvic: Vag. Bleeding: None Vag D/C Character: Thin  Cervical exam deferred        Extremities: Normal range of motion.  Edema: None  Mental Status: Normal mood and affect. Normal behavior. Normal judgment and thought content.   Urinalysis: Urine Protein: Negative Urine Glucose: Negative  Assessment and Plan:  Pregnancy: [name] at [redacted]w[redacted]d  1. Hypertension in pregnancy, essential, antepartum, second trimester Stable BP on Labetalol. ASA 81 mg daily prescribed.  2. AMA (advanced maternal age) multigravida 74+, second trimester Normal Panorama  3. Supervision of high risk pregnancy, antepartum, second trimester - [place] MFM OB DETAIL +14  WK; Future  Anatomy scan ordered. No other complaints or concerns.  Routine obstetric precautions reviewed. Please refer to After Visit Summary for other counseling recommendations.  Return in about 4 weeks (around 12/22/2015) for OB Visit.   Osborne Oman, MD

## 2015-11-24 NOTE — Patient Instructions (Signed)
Return to clinic for any scheduled appointments or obstetric concerns, or go to [facility] for evaluation  

## 2015-12-08 ENCOUNTER — Ambulatory Visit (INDEPENDENT_AMBULATORY_CARE_PROVIDER_SITE_OTHER): Payer: 59 | Admitting: Obstetrics & Gynecology

## 2015-12-08 VITALS — BP 124/80 | HR 83 | Wt 193.4 lb

## 2015-12-08 DIAGNOSIS — O26892 Other specified pregnancy related conditions, second trimester: Secondary | ICD-10-CM

## 2015-12-08 DIAGNOSIS — O99210 Obesity complicating pregnancy, unspecified trimester: Secondary | ICD-10-CM | POA: Insufficient documentation

## 2015-12-08 NOTE — Progress Notes (Signed)
This is an unscheduled visit for the complaint of 3 days of leaking clear fluid. Last sex 4 days ago, denies fevers or pain. Exam reveals Grade 4 cystocele, negative Valsalva, indeterminate nitrazine, normal AFI with active fetus noted, and negative fern test. Reassurance given. Keep next scheduled appt.

## 2015-12-22 ENCOUNTER — Ambulatory Visit (INDEPENDENT_AMBULATORY_CARE_PROVIDER_SITE_OTHER): Payer: 59 | Admitting: Family Medicine

## 2015-12-22 VITALS — BP 122/72 | HR 86 | Wt 194.0 lb

## 2015-12-22 DIAGNOSIS — O0992 Supervision of high risk pregnancy, unspecified, second trimester: Secondary | ICD-10-CM

## 2015-12-22 DIAGNOSIS — O10012 Pre-existing essential hypertension complicating pregnancy, second trimester: Secondary | ICD-10-CM

## 2015-12-22 NOTE — Patient Instructions (Signed)
Second Trimester of Pregnancy The second trimester is from week 13 through week 28, months 4 through 6. The second trimester is often a time when you feel your best. Your body has also adjusted to being pregnant, and you begin to feel better physically. Usually, morning sickness has lessened or quit completely, you may have more energy, and you may have an increase in appetite. The second trimester is also a time when the fetus is growing rapidly. At the end of the sixth month, the fetus is about 9 inches long and weighs about 1 pounds. You will likely begin to feel the baby move (quickening) between 18 and 20 weeks of the pregnancy. BODY CHANGES Your body goes through many changes during pregnancy. The changes vary from woman to woman.   Your weight will continue to increase. You will notice your lower abdomen bulging out.  You may begin to get stretch marks on your hips, abdomen, and breasts.  You may develop headaches that can be relieved by medicines approved by your health care provider.  You may urinate more often because the fetus is pressing on your bladder.  You may develop or continue to have heartburn as a result of your pregnancy.  You may develop constipation because certain hormones are causing the muscles that push waste through your intestines to slow down.  You may develop hemorrhoids or swollen, bulging veins (varicose veins).  You may have back pain because of the weight gain and pregnancy hormones relaxing your joints between the bones in your pelvis and as a result of a shift in weight and the muscles that support your balance.  Your breasts will continue to grow and be tender.  Your gums may bleed and may be sensitive to brushing and flossing.  Dark spots or blotches (chloasma, mask of pregnancy) may develop on your face. This will likely fade after the baby is born.  A dark line from your belly button to the pubic area (linea nigra) may appear. This will likely  fade after the baby is born.  You may have changes in your hair. These can include thickening of your hair, rapid growth, and changes in texture. Some women also have hair loss during or after pregnancy, or hair that feels dry or thin. Your hair will most likely return to normal after your baby is born. WHAT TO EXPECT AT YOUR PRENATAL VISITS During a routine prenatal visit:  You will be weighed to make sure you and the fetus are growing normally.  Your blood pressure will be taken.  Your abdomen will be measured to track your baby's growth.  The fetal heartbeat will be listened to.  Any test results from the previous visit will be discussed. Your health care provider may ask you:  How you are feeling.  If you are feeling the baby move.  If you have had any abnormal symptoms, such as leaking fluid, bleeding, severe headaches, or abdominal cramping.  If you are using any tobacco products, including cigarettes, chewing tobacco, and electronic cigarettes.  If you have any questions. Other tests that may be performed during your second trimester include:  Blood tests that check for:  Low iron levels (anemia).  Gestational diabetes (between 24 and 28 weeks).  Rh antibodies.  Urine tests to check for infections, diabetes, or protein in the urine.  An ultrasound to confirm the proper growth and development of the baby.  An amniocentesis to check for possible genetic problems.  Fetal screens for spina bifida   and Down syndrome.  HIV (human immunodeficiency virus) testing. Routine prenatal testing includes screening for HIV, unless you choose not to have this test. HOME CARE INSTRUCTIONS   Avoid all smoking, herbs, alcohol, and unprescribed drugs. These chemicals affect the formation and growth of the baby.  Do not use any tobacco products, including cigarettes, chewing tobacco, and electronic cigarettes. If you need help quitting, ask your health care provider. You may receive  counseling support and other resources to help you quit.  Follow your health care provider's instructions regarding medicine use. There are medicines that are either safe or unsafe to take during pregnancy.  Exercise only as directed by your health care provider. Experiencing uterine cramps is a good sign to stop exercising.  Continue to eat regular, healthy meals.  Wear a good support bra for breast tenderness.  Do not use hot tubs, steam rooms, or saunas.  Wear your seat belt at all times when driving.  Avoid raw meat, uncooked cheese, cat litter boxes, and soil used by cats. These carry germs that can cause birth defects in the baby.  Take your prenatal vitamins.  Take 1500-2000 mg of calcium daily starting at the 20th week of pregnancy until you deliver your baby.  Try taking a stool softener (if your health care provider approves) if you develop constipation. Eat more high-fiber foods, such as fresh vegetables or fruit and whole grains. Drink plenty of fluids to keep your urine clear or pale yellow.  Take warm sitz baths to soothe any pain or discomfort caused by hemorrhoids. Use hemorrhoid cream if your health care provider approves.  If you develop varicose veins, wear support hose. Elevate your feet for 15 minutes, 3-4 times a day. Limit salt in your diet.  Avoid heavy lifting, wear low heel shoes, and practice good posture.  Rest with your legs elevated if you have leg cramps or low back pain.  Visit your dentist if you have not gone yet during your pregnancy. Use a soft toothbrush to brush your teeth and be gentle when you floss.  A sexual relationship may be continued unless your health care provider directs you otherwise.  Continue to go to all your prenatal visits as directed by your health care provider. SEEK MEDICAL CARE IF:   You have dizziness.  You have mild pelvic cramps, pelvic pressure, or nagging pain in the abdominal area.  You have persistent nausea,  vomiting, or diarrhea.  You have a bad smelling vaginal discharge.  You have pain with urination. SEEK IMMEDIATE MEDICAL CARE IF:   You have a fever.  You are leaking fluid from your vagina.  You have spotting or bleeding from your vagina.  You have severe abdominal cramping or pain.  You have rapid weight gain or loss.  You have shortness of breath with chest pain.  You notice sudden or extreme swelling of your face, hands, ankles, feet, or legs.  You have not felt your baby move in over an hour.  You have severe headaches that do not go away with medicine.  You have vision changes.   This information is not intended to replace advice given to you by your health care provider. Make sure you discuss any questions you have with your health care provider.   Document Released: 06/12/2001 Document Revised: 07/09/2014 Document Reviewed: 08/19/2012 Elsevier Interactive Patient Education 2016 [employer]   Breastfeeding Deciding to breastfeed is one of the best choices you can make for you and your baby. A change   hormones during pregnancy causes your breast tissue to grow and increases the number and size of your milk ducts. These hormones also allow proteins, sugars, and fats from your blood supply to make breast milk in your milk-producing glands. Hormones prevent breast milk from being released before your baby is born as well as prompt milk flow after birth. Once breastfeeding has begun, thoughts of your baby, as well as his or her sucking or crying, can stimulate the release of milk from your milk-producing glands.  BENEFITS OF BREASTFEEDING For Your Baby  Your first milk (colostrum) helps your baby's digestive system function better.  There are antibodies in your milk that help your baby fight off infections.  Your baby has a lower incidence of asthma, allergies, and sudden infant death syndrome.  The nutrients in breast milk are better for your baby than infant  formulas and are designed uniquely for your baby's needs.  Breast milk improves your baby's brain development.  Your baby is less likely to develop other conditions, such as childhood obesity, asthma, or type 2 diabetes mellitus. For You  Breastfeeding helps to create a very special bond between you and your baby.  Breastfeeding is convenient. Breast milk is always available at the correct temperature and costs nothing.  Breastfeeding helps to burn calories and helps you lose the weight gained during pregnancy.  Breastfeeding makes your uterus contract to its prepregnancy size faster and slows bleeding (lochia) after you give birth.   Breastfeeding helps to lower your risk of developing type 2 diabetes mellitus, osteoporosis, and breast or ovarian cancer later in life. SIGNS THAT YOUR BABY IS HUNGRY Early Signs of Hunger  Increased alertness or activity.  Stretching.  Movement of the head from side to side.  Movement of the head and opening of the mouth when the corner of the mouth or cheek is stroked (rooting).  Increased sucking sounds, smacking lips, cooing, sighing, or squeaking.  Hand-to-mouth movements.  Increased sucking of fingers or hands. Late Signs of Hunger  Fussing.  Intermittent crying. Extreme Signs of Hunger Signs of extreme hunger will require calming and consoling before your baby will be able to breastfeed successfully. Do not wait for the following signs of extreme hunger to occur before you initiate breastfeeding:  Restlessness.  A loud, strong cry.  Screaming. BREASTFEEDING BASICS Breastfeeding Initiation  Find a comfortable place to sit or lie down, with your neck and back well supported.  Place a pillow or rolled up blanket under your baby to bring him or her to the level of your breast (if you are seated). Nursing pillows are specially designed to help support your arms and your baby while you breastfeed.  Make sure that your baby's  abdomen is facing your abdomen.  Gently massage your breast. With your fingertips, massage from your chest wall toward your nipple in a circular motion. This encourages milk flow. You may need to continue this action during the feeding if your milk flows slowly.  Support your breast with 4 fingers underneath and your thumb above your nipple. Make sure your fingers are well away from your nipple and your baby's mouth.  Stroke your baby's lips gently with your finger or nipple.  When your baby's mouth is open wide enough, quickly bring your baby to your breast, placing your entire nipple and as much of the colored area around your nipple (areola) as possible into your baby's mouth.  More areola should be visible above your baby's upper lip than below  the lower lip.  Your baby's tongue should be between his or her lower gum and your breast.  Ensure that your baby's mouth is correctly positioned around your nipple (latched). Your baby's lips should create a seal on your breast and be turned out (everted).  It is common for your baby to suck about 2-3 minutes in order to start the flow of breast milk. Latching Teaching your baby how to latch on to your breast properly is very important. An improper latch can cause nipple pain and decreased milk supply for you and poor weight gain in your baby. Also, if your baby is not latched onto your nipple properly, he or she may swallow some air during feeding. This can make your baby fussy. Burping your baby when you switch breasts during the feeding can help to get rid of the air. However, teaching your baby to latch on properly is still the best way to prevent fussiness from swallowing air while breastfeeding. Signs that your baby has successfully latched on to your nipple:  Silent tugging or silent sucking, without causing you pain.  Swallowing heard between every 3-4 sucks.  Muscle movement above and in front of his or her ears while sucking. Signs  that your baby has not successfully latched on to nipple:  Sucking sounds or smacking sounds from your baby while breastfeeding.  Nipple pain. If you think your baby has not latched on correctly, slip your finger into the corner of your baby's mouth to break the suction and place it between your baby's gums. Attempt breastfeeding initiation again. Signs of Successful Breastfeeding Signs from your baby:  A gradual decrease in the number of sucks or complete cessation of sucking.  Falling asleep.  Relaxation of his or her body.  Retention of a small amount of milk in his or her mouth.  Letting go of your breast by himself or herself. Signs from you:  Breasts that have increased in firmness, weight, and size 1-3 hours after feeding.  Breasts that are softer immediately after breastfeeding.  Increased milk volume, as well as a change in milk consistency and color by the fifth day of breastfeeding.  Nipples that are not sore, cracked, or bleeding. Signs That Your [employer] is Getting Enough Milk  Wetting at least 3 diapers in a 24-hour period. The urine should be clear and pale yellow by age 17 days.  At least 3 stools in a 24-hour period by age 17 days. The stool should be soft and yellow.  At least 3 stools in a 24-hour period by age 395 days. The stool should be seedy and yellow.  No loss of weight greater than 10% of birth weight during the first 65 days of age.  Average weight gain of 4-7 ounces (113-198 g) per week after age 39 days.  Consistent daily weight gain by age 179 days, without weight loss after the age of 2 weeks. After a feeding, your baby may spit up a small amount. This is common. BREASTFEEDING FREQUENCY AND DURATION Frequent feeding will help you make more milk and can prevent sore nipples and breast engorgement. Breastfeed when you feel the need to reduce the fullness of your breasts or when your baby shows signs of hunger. This is called "breastfeeding on demand." Avoid  introducing a pacifier to your baby while you are working to establish breastfeeding (the first 4-6 weeks after your baby is born). After this time you may choose to use a pacifier. Research has shown that pacifier  use during the first year of a baby's life decreases the risk of sudden infant death syndrome (SIDS). Allow your baby to feed on each breast as long as he or she wants. Breastfeed until your baby is finished feeding. When your baby unlatches or falls asleep while feeding from the first breast, offer the second breast. Because newborns are often sleepy in the first few weeks of life, you may need to awaken your baby to get him or her to feed. Breastfeeding times will vary from baby to baby. However, the following rules can serve as a guide to help you ensure that your baby is properly fed:  Newborns (babies 13 weeks of age or younger) may breastfeed every 1-3 hours.  Newborns should not go longer than 3 hours during the day or 5 hours during the night without breastfeeding.  You should breastfeed your baby a minimum of 8 times in a 24-hour period until you begin to introduce solid foods to your baby at around 31 months of age. BREAST MILK PUMPING Pumping and storing breast milk allows you to ensure that your baby is exclusively fed your breast milk, even at times when you are unable to breastfeed. This is especially important if you are going back to work while you are still breastfeeding or when you are not able to be present during feedings. Your lactation consultant can give you guidelines on how long it is safe to store breast milk. A breast pump is a machine that allows you to pump milk from your breast into a sterile bottle. The pumped breast milk can then be stored in a refrigerator or freezer. Some breast pumps are operated by hand, while others use electricity. Ask your lactation consultant which type will work best for you. Breast pumps can be purchased, but some hospitals and  breastfeeding support groups lease breast pumps on a monthly basis. A lactation consultant can teach you how to hand express breast milk, if you prefer not to use a pump. CARING FOR YOUR BREASTS WHILE YOU BREASTFEED Nipples can become dry, cracked, and sore while breastfeeding. The following recommendations can help keep your breasts moisturized and healthy:  Avoid using soap on your nipples.  Wear a supportive bra. Although not required, special nursing bras and tank tops are designed to allow access to your breasts for breastfeeding without taking off your entire bra or top. Avoid wearing underwire-style bras or extremely tight bras.  Air dry your nipples for 3-89minutes after each feeding.  Use only cotton bra pads to absorb leaked breast milk. Leaking of breast milk between feedings is normal.  Use lanolin on your nipples after breastfeeding. Lanolin helps to maintain your skin's normal moisture barrier. If you use pure lanolin, you do not need to wash it off before feeding your baby again. Pure lanolin is not toxic to your baby. You may also hand express a few drops of breast milk and gently massage that milk into your nipples and allow the milk to air dry. In the first few weeks after giving birth, some women experience extremely full breasts (engorgement). Engorgement can make your breasts feel heavy, warm, and tender to the touch. Engorgement peaks within 3-5 days after you give birth. The following recommendations can help ease engorgement:  Completely empty your breasts while breastfeeding or pumping. You may want to start by applying warm, moist heat (in the shower or with warm water-soaked hand towels) just before feeding or pumping. This increases circulation and helps the milk flow.  If your baby does not completely empty your breasts while breastfeeding, pump any extra milk after he or she is finished.  Wear a snug bra (nursing or regular) or tank top for 1-2 days to signal your body  to slightly decrease milk production.  Apply ice packs to your breasts, unless this is too uncomfortable for you.  Make sure that your baby is latched on and positioned properly while breastfeeding. If engorgement persists after 48 hours of following these recommendations, contact your health care provider or a Science writer. OVERALL HEALTH CARE RECOMMENDATIONS WHILE BREASTFEEDING  Eat healthy foods. Alternate between meals and snacks, eating 3 of each per day. Because what you eat affects your breast milk, some of the foods may make your baby more irritable than usual. Avoid eating these foods if you are sure that they are negatively affecting your baby.  Drink milk, fruit juice, and water to satisfy your thirst (about 10 glasses a day).  Rest often, relax, and continue to take your prenatal vitamins to prevent fatigue, stress, and anemia.  Continue breast self-awareness checks.  Avoid chewing and smoking tobacco. Chemicals from cigarettes that pass into breast milk and exposure to secondhand smoke may harm your baby.  Avoid alcohol and drug use, including marijuana. Some medicines that may be harmful to your baby can pass through breast milk. It is important to ask your health care provider before taking any medicine, including all over-the-counter and prescription medicine as well as vitamin and herbal supplements. It is possible to become pregnant while breastfeeding. If birth control is desired, ask your health care provider about options that will be safe for your baby. SEEK MEDICAL CARE IF:  You feel like you want to stop breastfeeding or have become frustrated with breastfeeding.  You have painful breasts or nipples.  Your nipples are cracked or bleeding.  Your breasts are red, tender, or warm.  You have a swollen area on either breast.  You have a fever or chills.  You have nausea or vomiting.  You have drainage other than breast milk from your nipples.  Your  breasts do not become full before feedings by the fifth day after you give birth.  You feel sad and depressed.  Your baby is too sleepy to eat well.  Your baby is having trouble sleeping.   Your baby is wetting less than 3 diapers in a 24-hour period.  Your baby has less than 3 stools in a 24-hour period.  Your baby's skin or the white part of his or her eyes becomes yellow.   Your baby is not gaining weight by 26 days of age. SEEK IMMEDIATE MEDICAL CARE IF:  Your baby is overly tired (lethargic) and does not want to wake up and feed.  Your baby develops an unexplained fever.   This information is not intended to replace advice given to you by your health care provider. Make sure you discuss any questions you have with your health care provider.   Document Released: 06/18/2005 Document Revised: 03/09/2015 Document Reviewed: 12/10/2012 Elsevier Interactive Patient Education [employer].

## 2015-12-22 NOTE — Progress Notes (Signed)
Subjective:  [name] is a 35 y.o. [id number] at [redacted]w[redacted]d being seen today for ongoing prenatal care.  She is currently monitored for the following issues for this high-risk pregnancy and has HTN (hypertension), benign; Hypertriglyceridemia; Supervision of high risk pregnancy, antepartum; Hypertension in pregnancy, essential, antepartum; AMA (advanced maternal age) multigravida 35+; Desires sterilization; and Obesity in pregnancy on her problem list.  Patient reports no complaints.  Contractions: Not present. Vag. Bleeding: None.  Movement: Absent. Denies leaking of fluid.   The following portions of the patient's history were reviewed and updated as appropriate: allergies, current medications, past family history, past medical history, past social history, past surgical history and problem list. Problem list updated.  Objective:   Filed Vitals:   12/22/15 0942  BP: 122/72  Pulse: 86  Weight: 194 lb (87.998 kg)    Fetal Status: Fetal Heart Rate (bpm): 141 Fundal Height: 19 cm Movement: Absent     General:  Alert, oriented and cooperative. Patient is in no acute distress.  Skin: Skin is warm and dry. No rash noted.   Cardiovascular: Normal heart rate noted  Respiratory: Normal respiratory effort, no problems with respiration noted  Abdomen: Soft, gravid, appropriate for gestational age. Pain/Pressure: Absent     Pelvic: Cervical exam deferred        Extremities: Normal range of motion.  Edema: Trace  Mental Status: Normal mood and affect. Normal behavior. Normal judgment and thought content.   Urinalysis: Urine Protein: Negative Urine Glucose: Negative  Assessment and Plan:  Pregnancy: [name] at [redacted]w[redacted]d  1. Hypertension in pregnancy, essential, antepartum, second trimester Continue Atenolol and ASA   2. Supervision of high risk pregnancy, antepartum, second trimester Continue prenatal care. Anatomy next visit  Term labor symptoms and general obstetric precautions including  but not limited to vaginal bleeding, contractions, leaking of fluid and fetal movement were reviewed in detail with the patient. Please refer to After Visit Summary for other counseling recommendations.  Return in 4 weeks (on 01/19/2016).   Donnamae Jude, MD

## 2015-12-22 NOTE — Progress Notes (Signed)
Pt c/o carpal tunnel. She had labia pain but that has resolved now.

## 2015-12-23 ENCOUNTER — Encounter (HOSPITAL_COMMUNITY): Payer: Self-pay

## 2015-12-23 ENCOUNTER — Other Ambulatory Visit: Payer: Self-pay | Admitting: Obstetrics & Gynecology

## 2015-12-23 ENCOUNTER — Ambulatory Visit (HOSPITAL_COMMUNITY)
Admission: RE | Admit: 2015-12-23 | Discharge: 2015-12-23 | Disposition: A | Discharge status: Home / Self Care | Payer: 59 | Source: Ambulatory Visit | Attending: Obstetrics & Gynecology | Admitting: Obstetrics & Gynecology

## 2015-12-23 VITALS — BP 115/63 | HR 87 | Wt 194.1 lb

## 2015-12-23 DIAGNOSIS — Z3A18 18 weeks gestation of pregnancy: Secondary | ICD-10-CM | POA: Diagnosis present

## 2015-12-23 DIAGNOSIS — O09293 Supervision of pregnancy with other poor reproductive or obstetric history, third trimester: Secondary | ICD-10-CM | POA: Diagnosis not present

## 2015-12-23 DIAGNOSIS — O10012 Pre-existing essential hypertension complicating pregnancy, second trimester: Secondary | ICD-10-CM | POA: Diagnosis present

## 2015-12-23 DIAGNOSIS — O09522 Supervision of elderly multigravida, second trimester: Secondary | ICD-10-CM | POA: Insufficient documentation

## 2015-12-23 DIAGNOSIS — Z36 Encounter for antenatal screening of mother: Secondary | ICD-10-CM | POA: Insufficient documentation

## 2015-12-23 DIAGNOSIS — O0992 Supervision of high risk pregnancy, unspecified, second trimester: Secondary | ICD-10-CM

## 2015-12-23 DIAGNOSIS — O99210 Obesity complicating pregnancy, unspecified trimester: Secondary | ICD-10-CM

## 2015-12-23 IMAGING — US US MFM OB DETAIL+14 WK
1 series · 14 of 28 positions shown · non-contrast
Comparison: none

[Series 1: us mfm ob detail+14 wk · 82 acquisitions, 14 frames shown]
[im 4/82]
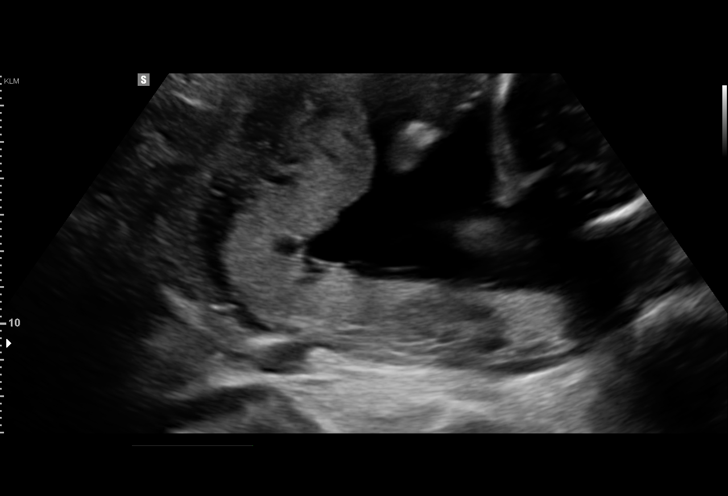
[im 10/82]
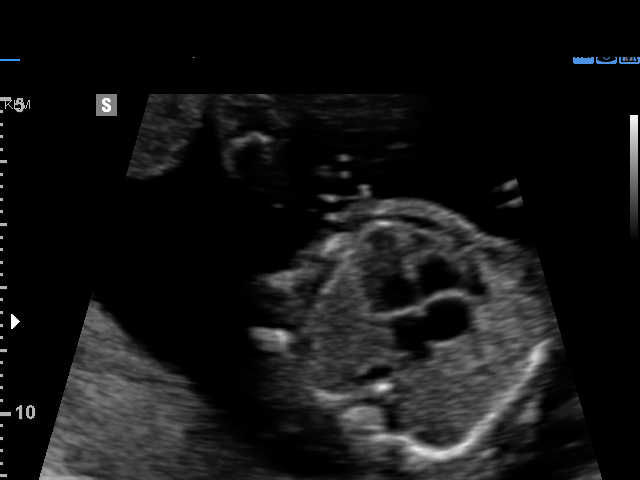
[im 16/82]
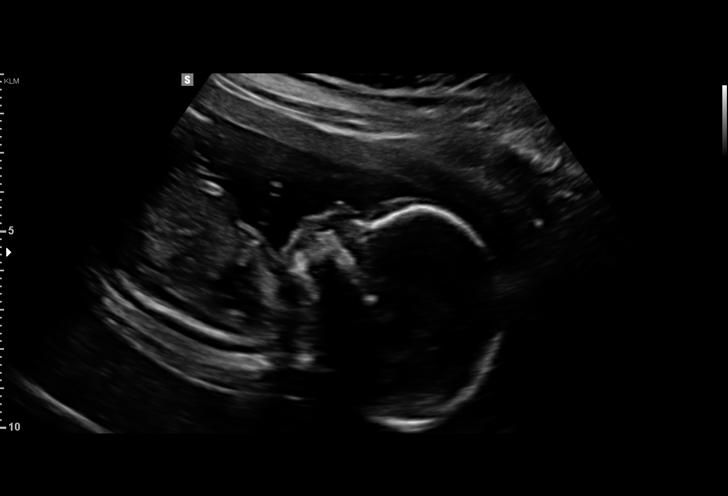
[im 22/82]
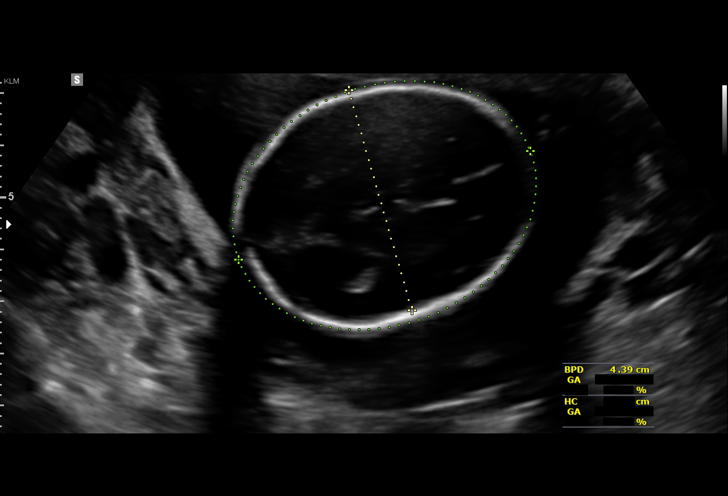
[im 28/82]
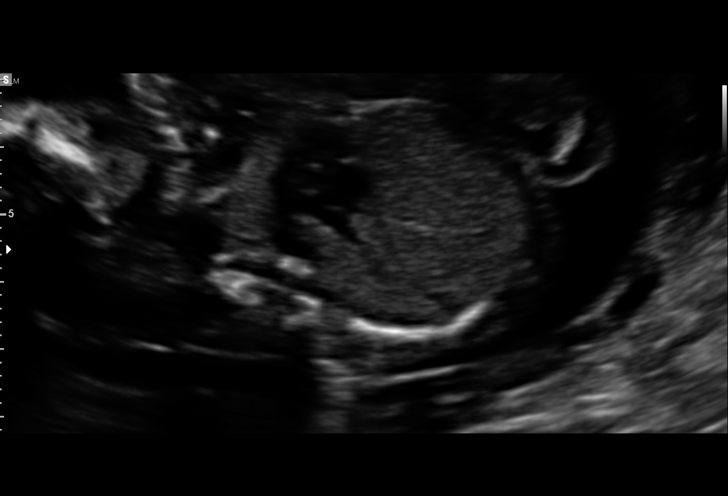
[im 34/82]
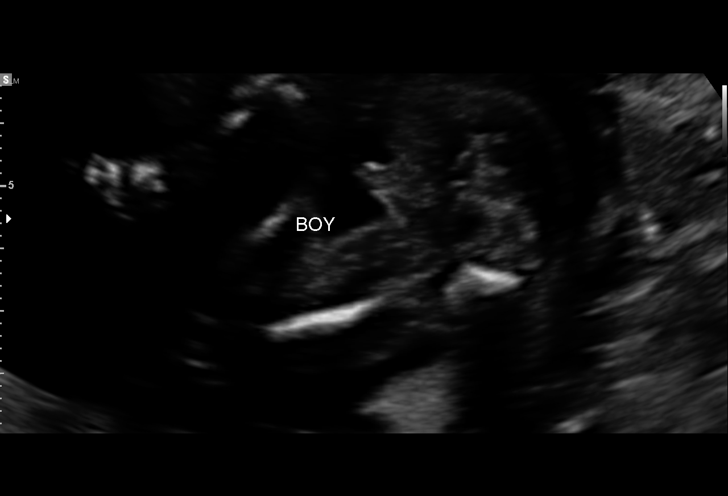
[im 40/82]
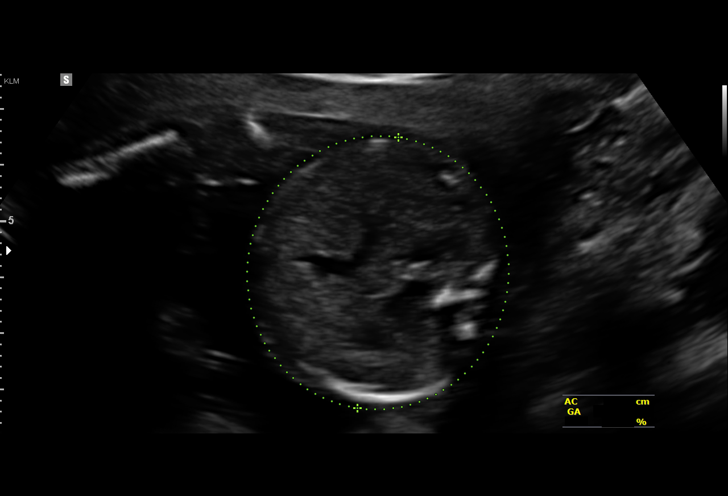
[im 46/82]
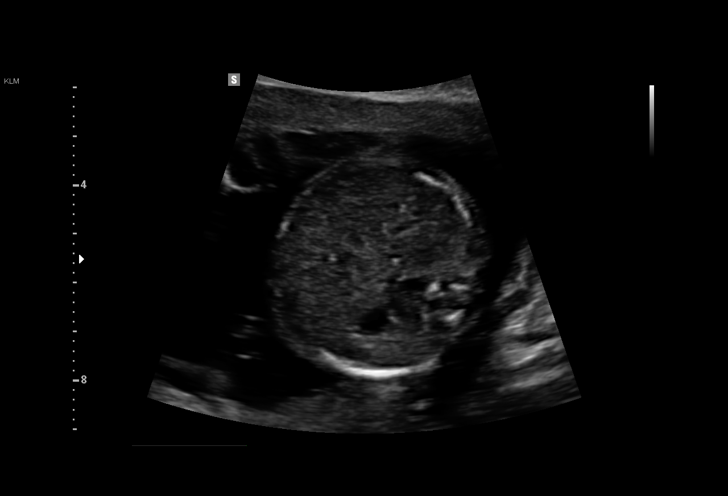
[im 52/82]
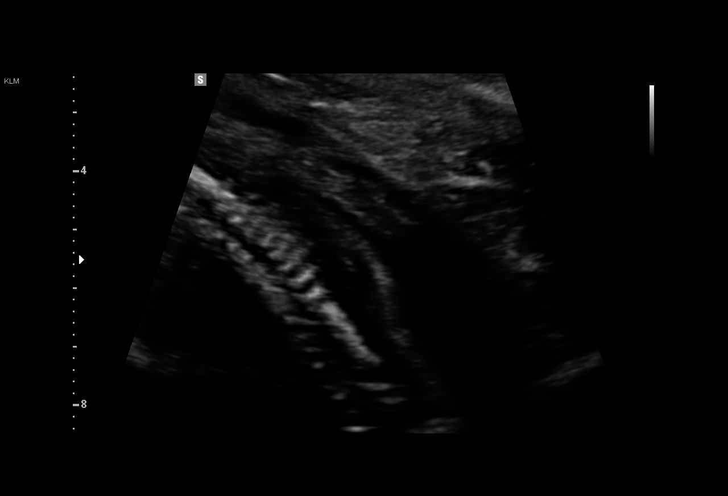
[im 58/82]
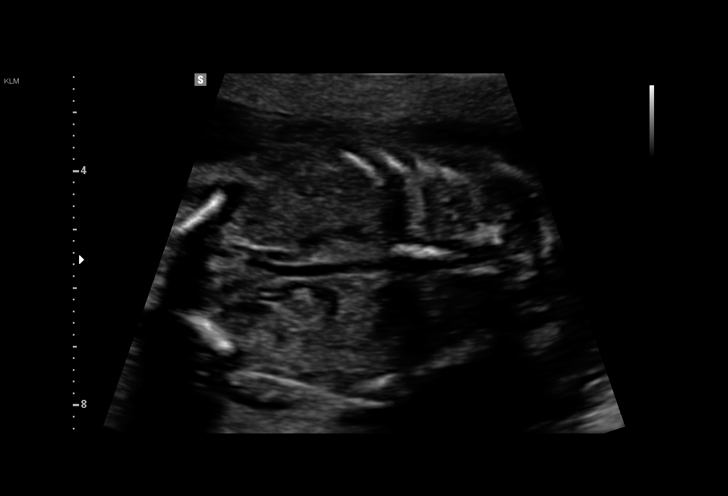
[im 64/82]
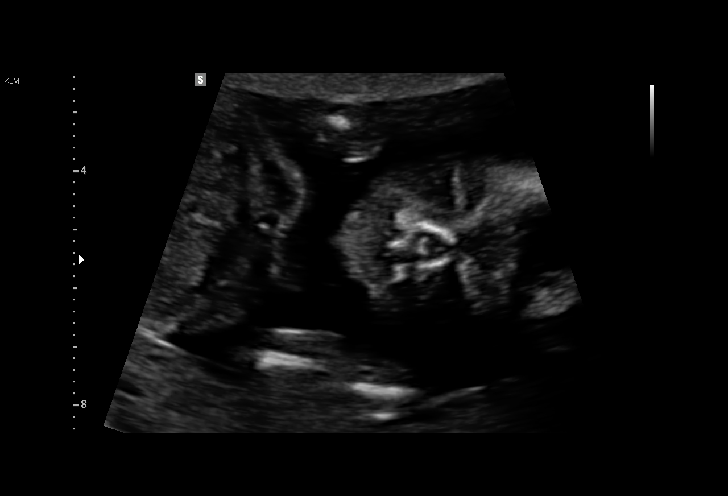
[im 70/82]
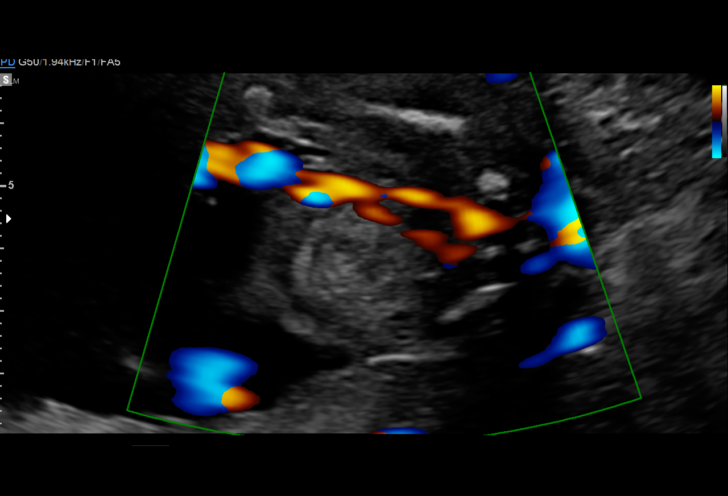
[im 76/82]
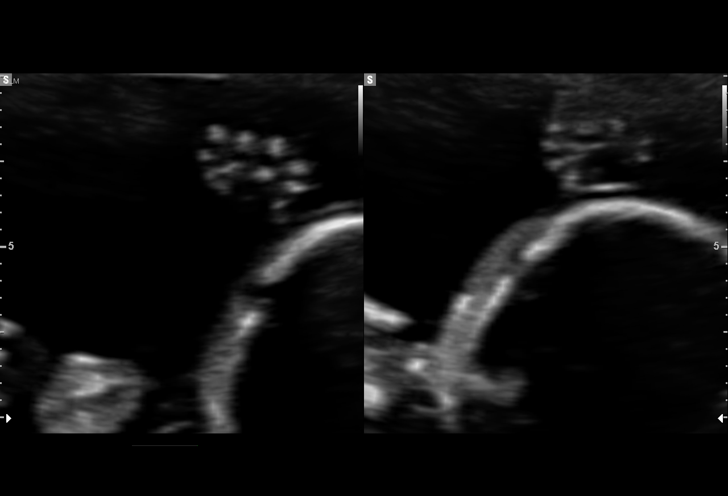
[im 82/82]
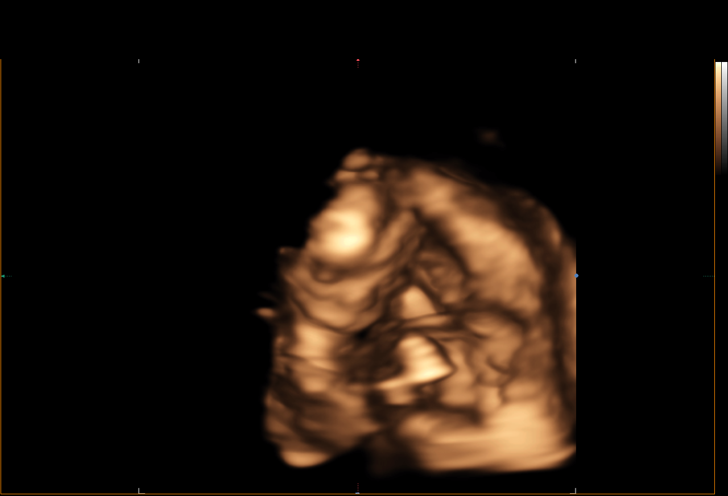

[14 of 28 positions shown; findings below may reference images not displayed]

[REDACTED]

1  MARQARITA           [PHONE_NUMBER]      [PHONE_NUMBER]     [PHONE_NUMBER]
Indications

18 weeks gestation of pregnancy
Advanced maternal age multigravida 35+,        [A4]
third trimester
Poor obstetric history: Previous gestational   [A4]
diabetes
Hypertension - Chronic/Pre-existing            [A4]
Detailed fetal anatomic survey                 Z36
OB History

Blood Type:            Height:  5'4"   Weight (lb):  194       BMI:
Gravidity:    7         Term:   3        Prem:   0        SAB:   1
TOP:          2       Ectopic:  0        Living: 3
Fetal Evaluation

Num Of Fetuses:     1
Fetal Heart         135
Rate(bpm):
Cardiac Activity:   Observed
Presentation:       Variable
Placenta:           Posterior, above cervical os
P. Cord Insertion:  Visualized

Amniotic Fluid
AFI FV:      Subjectively within normal limits

Largest Pocket(cm)
4.1
Biometry

BPD:      43.9  mm     G. Age:  19w 2d         69  %    CI:         70.2   %    70 - 86
FL/HC:      17.0   %    16.1 -
HC:      167.1  mm     G. Age:  19w 3d         68  %    HC/AC:      1.14        1.09 -
AC:      147.1  mm     G. Age:  20w 0d         81  %    FL/BPD:     64.7   %
FL:       28.4  mm     G. Age:  18w 5d         39  %    FL/AC:      19.3   %    20 - 24
HUM:      29.5  mm     G. Age:  19w 5d         72  %

Est. FW:     290  gm    0 lb 10 oz      54  %
Gestational Age

LMP:           20w 6d        Date:  [DATE]                 EDD:   [DATE]
U/S Today:     19w 3d                                        EDD:   [DATE]
Best:          18w 6d     Det. By:  Early Ultrasound         EDD:   [DATE]
([DATE])
Anatomy

Cranium:               Appears normal         Aortic Arch:            Appears normal
Cavum:                 Appears normal         Ductal Arch:            Appears normal
Ventricles:            Appears normal         Diaphragm:              Appears normal
Choroid Plexus:        Appears normal         Stomach:                Appears normal, left
sided
Cerebellum:            Appears normal         Abdomen:                Appears normal
Posterior Fossa:       Appears normal         Abdominal Wall:         Appears nml (cord
insert, abd wall)
Nuchal Fold:           Appears normal         Cord Vessels:           Appears normal (3
vessel cord)
Face:                  Appears normal         Kidneys:                Appear normal
(orbits and profile)
Lips:                  Appears normal         Bladder:                Appears normal
Thoracic:              Appears normal         Spine:                  Appears normal
Heart:                 Appears normal         Upper Extremities:      Appears normal
(4CH, axis, and situs
RVOT:                  Appears normal         Lower Extremities:      Appears normal
LVOT:                  Appears normal

Other:  Fetus appears to be a male. Heels and 5th digit visualized. Nasal
bone visualized.
Cervix Uterus Adnexa

Cervix
Length:              4  cm.
Normal appearance by transabdominal scan.
Impression

Single IUP at 18w 6d
Advanced maternal age, CHTN- declined genetic counseling
Normal fetal anatomic survey
No markers associated with aneuploidy noted
Posterior placenta without previa
Normal amniotic fluid volume
Recommendations

Recommend follow-up ultrasound examination in 6 weeks for
interval growth (CHTN)

## 2015-12-28 ENCOUNTER — Encounter: Payer: 59 | Admitting: *Deleted

## 2016-01-19 ENCOUNTER — Ambulatory Visit (INDEPENDENT_AMBULATORY_CARE_PROVIDER_SITE_OTHER): Payer: 59 | Admitting: Obstetrics & Gynecology

## 2016-01-19 VITALS — BP 130/79 | HR 103 | Wt 198.0 lb

## 2016-01-19 DIAGNOSIS — O10013 Pre-existing essential hypertension complicating pregnancy, third trimester: Secondary | ICD-10-CM

## 2016-01-19 DIAGNOSIS — O09523 Supervision of elderly multigravida, third trimester: Secondary | ICD-10-CM

## 2016-01-19 DIAGNOSIS — Z789 Other specified health status: Secondary | ICD-10-CM

## 2016-01-19 DIAGNOSIS — O0992 Supervision of high risk pregnancy, unspecified, second trimester: Secondary | ICD-10-CM

## 2016-01-19 DIAGNOSIS — Z302 Encounter for sterilization: Secondary | ICD-10-CM

## 2016-01-19 DIAGNOSIS — O99210 Obesity complicating pregnancy, unspecified trimester: Secondary | ICD-10-CM

## 2016-01-19 NOTE — Progress Notes (Signed)
Subjective:  [name] is a 35 y.o. [id number] (all boys!)  at [redacted]w[redacted]d being seen today for ongoing prenatal care.  She is currently monitored for the following issues for this low-risk pregnancy and has HTN (hypertension), benign; Hypertriglyceridemia; Supervision of high risk pregnancy, antepartum; Hypertension in pregnancy, essential, antepartum; AMA (advanced maternal age) multigravida 49+; Desires sterilization; and Obesity in pregnancy on her problem list.  Patient reports no complaints.  Contractions: Not present. Vag. Bleeding: None.  Movement: Present. Denies leaking of fluid.   The following portions of the patient's history were reviewed and updated as appropriate: allergies, current medications, past family history, past medical history, past social history, past surgical history and problem list. Problem list updated.  Objective:   Filed Vitals:   01/19/16 1005  BP: 130/79  Pulse: 103  Weight: 198 lb (89.812 kg)    Fetal Status: Fetal Heart Rate (bpm): 141   Movement: Present     General:  Alert, oriented and cooperative. Patient is in no acute distress.  Skin: Skin is warm and dry. No rash noted.   Cardiovascular: Normal heart rate noted  Respiratory: Normal respiratory effort, no problems with respiration noted  Abdomen: Soft, gravid, appropriate for gestational age. Pain/Pressure: Present     Pelvic:  Cervical exam deferred        Extremities: Normal range of motion.  Edema: Trace  Mental Status: Normal mood and affect. Normal behavior. Normal judgment and thought content.   Urinalysis: Urine Protein: Trace Urine Glucose: Negative  Assessment and Plan:  Pregnancy: [id number] at [redacted]w[redacted]d  1. AMA (advanced maternal age) multigravida 21+, third trimester - normal NIPS  2. Hypertension in pregnancy, essential, antepartum, third trimester - on baby asa  3. Desires sterilization - private insurance  4. Obesity in pregnancy   5. Supervision of high risk pregnancy,  antepartum, second trimester  - glucola, labs, tdap at next visit  Preterm labor symptoms and general obstetric precautions including but not limited to vaginal bleeding, contractions, leaking of fluid and fetal movement were reviewed in detail with the patient. Please refer to After Visit Summary for other counseling recommendations.  Return in about 3 weeks (around 02/09/2016) for glucola, labs, tdap.   Emily Filbert, MD

## 2016-01-23 ENCOUNTER — Telehealth: Payer: Self-pay | Admitting: *Deleted

## 2016-01-23 NOTE — Telephone Encounter (Signed)
Spoke to Dr Hulan Fray about case, recommended to follow-up with PCP about the mastoiditis so they could treat appropriately.  Called pt, left message with recommendation.

## 2016-01-23 NOTE — Telephone Encounter (Signed)
-----   Message from Francia Greaves sent at 01/19/2016 10:59 AM EDT ----- Regarding: Advised Contact: [phone] August 2016 mastoiditis, did not complete abx, feels like it has returned again, does not think she will be able to take the abx because they were so strong, wants to know if this is something she needs to see her pcp for

## 2016-02-03 ENCOUNTER — Ambulatory Visit (HOSPITAL_COMMUNITY): Payer: 59

## 2016-02-06 ENCOUNTER — Encounter (HOSPITAL_COMMUNITY): Payer: Self-pay

## 2016-02-06 ENCOUNTER — Ambulatory Visit (HOSPITAL_COMMUNITY)
Admission: RE | Admit: 2016-02-06 | Discharge: 2016-02-06 | Disposition: A | Discharge status: Home / Self Care | Payer: 59 | Source: Ambulatory Visit | Attending: Obstetrics & Gynecology | Admitting: Obstetrics & Gynecology

## 2016-02-06 VITALS — BP 123/65 | HR 79 | Wt 197.8 lb

## 2016-02-06 DIAGNOSIS — O10012 Pre-existing essential hypertension complicating pregnancy, second trimester: Secondary | ICD-10-CM | POA: Insufficient documentation

## 2016-02-06 DIAGNOSIS — Z3A25 25 weeks gestation of pregnancy: Secondary | ICD-10-CM | POA: Diagnosis not present

## 2016-02-06 DIAGNOSIS — O10019 Pre-existing essential hypertension complicating pregnancy, unspecified trimester: Secondary | ICD-10-CM

## 2016-02-06 IMAGING — US US MFM OB FOLLOW-UP
1 series · 14 of 28 positions shown · non-contrast
Comparison: none

[Series 1: us mfm ob follow-up · 34 acquisitions, 14 frames shown]
[im 2/34]
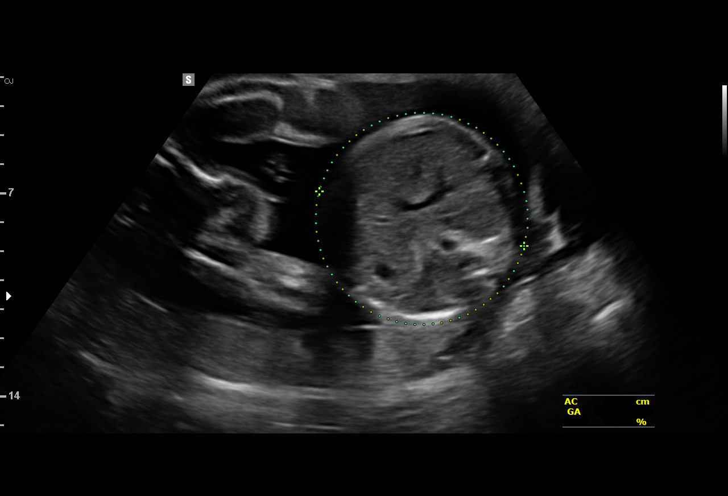
[im 4/34]
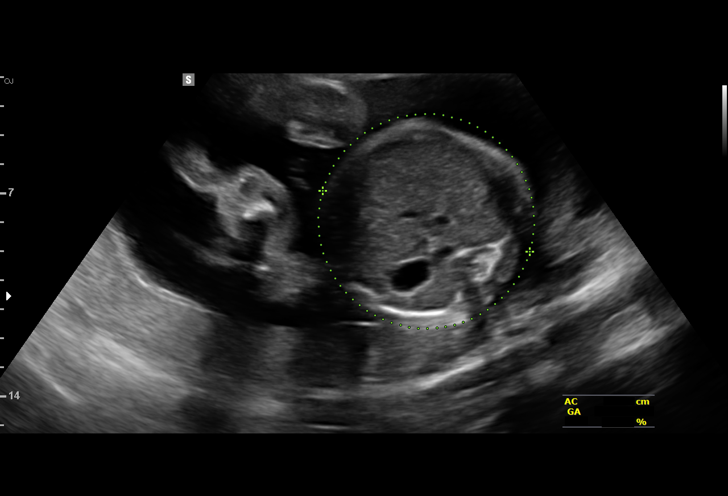
[im 7/34]
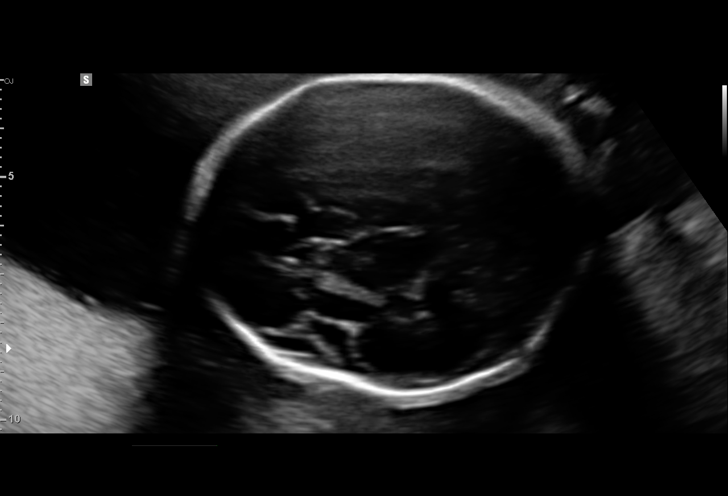
[im 9/34]
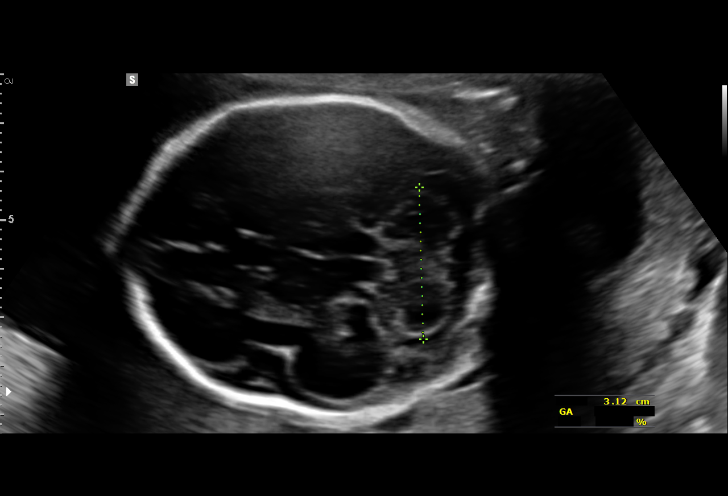
[im 12/34]
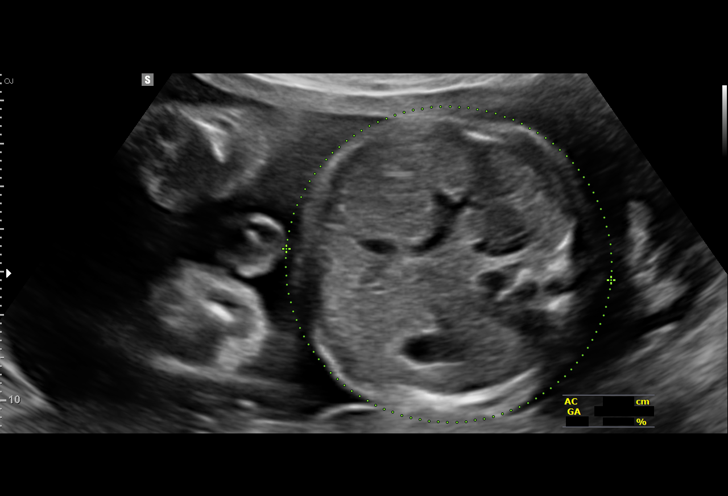
[im 14/34]
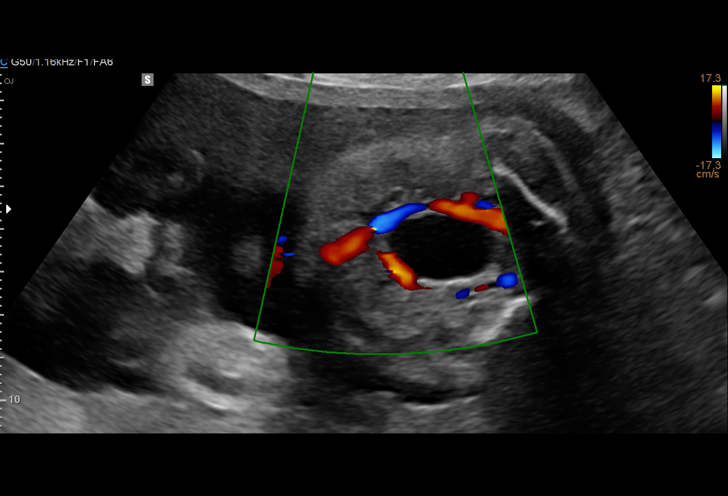
[im 16/34]
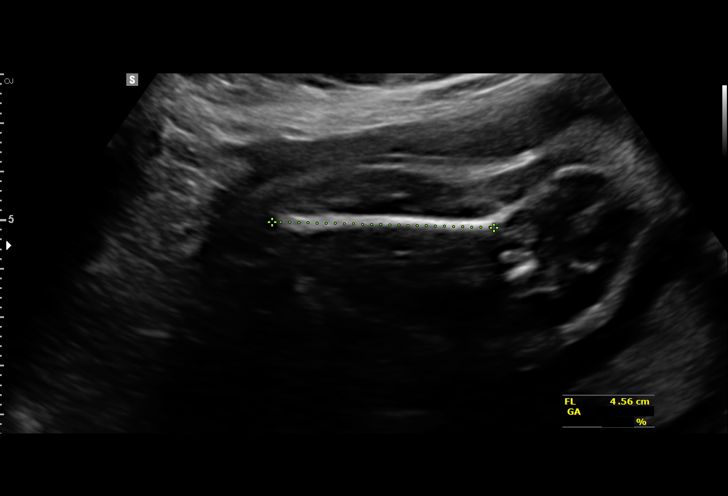
[im 19/34]
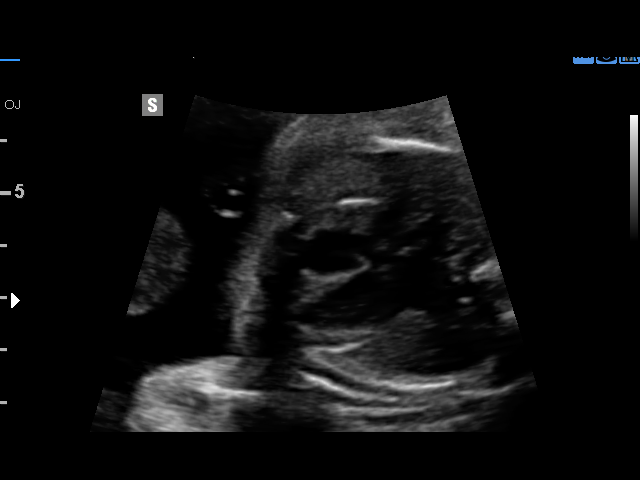
[im 21/34]
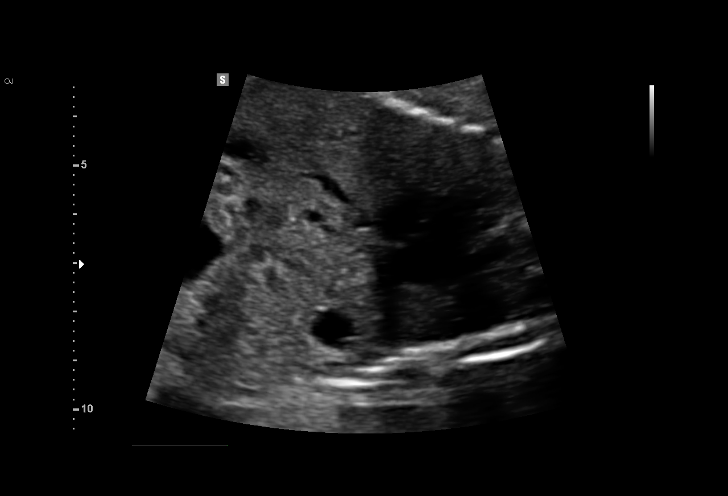
[im 24/34]
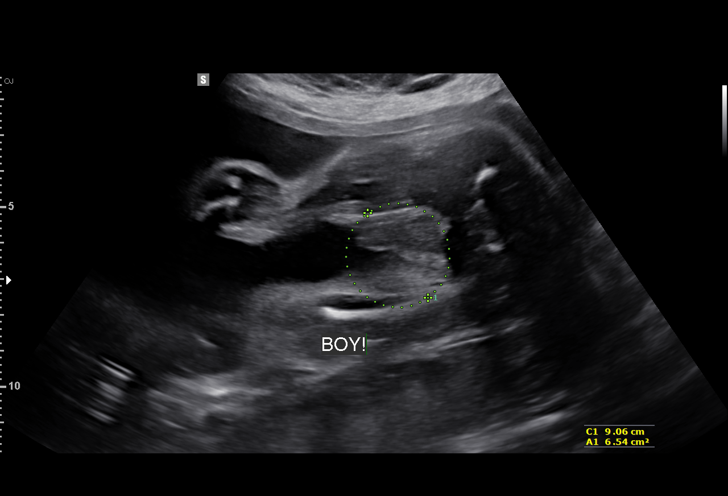
[im 26/34]
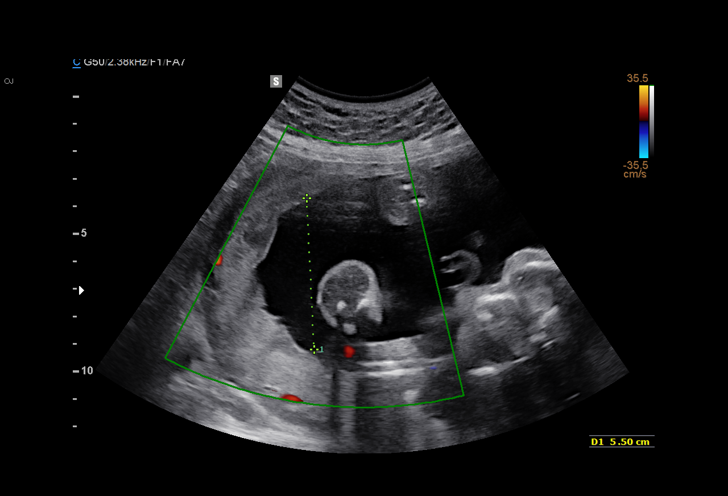
[im 29/34]
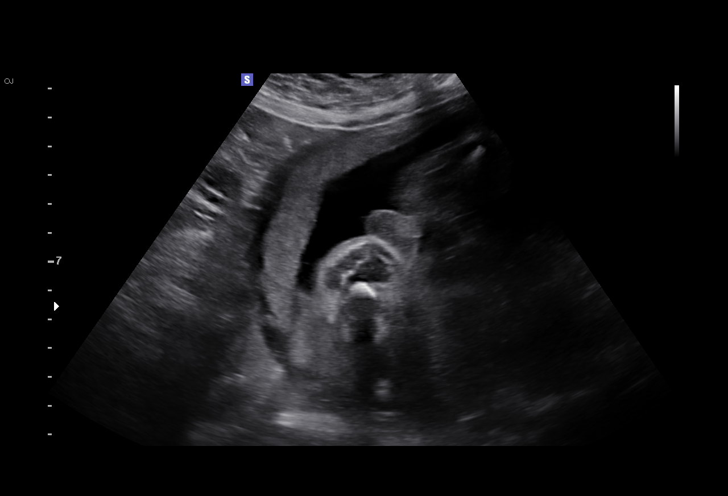
[im 31/34]
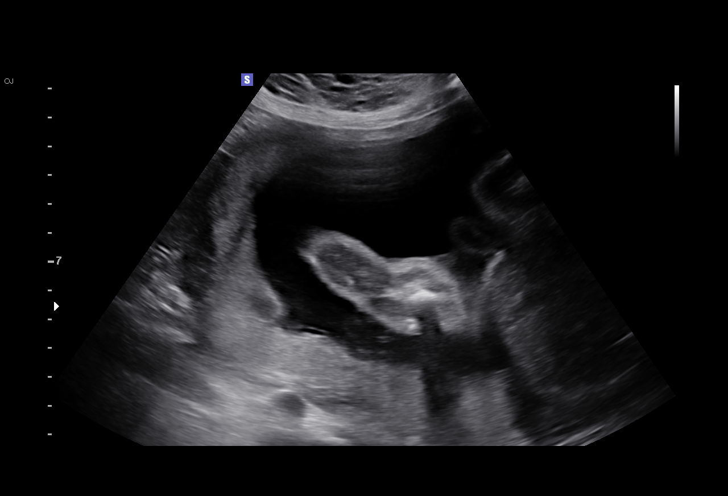
[im 34/34]
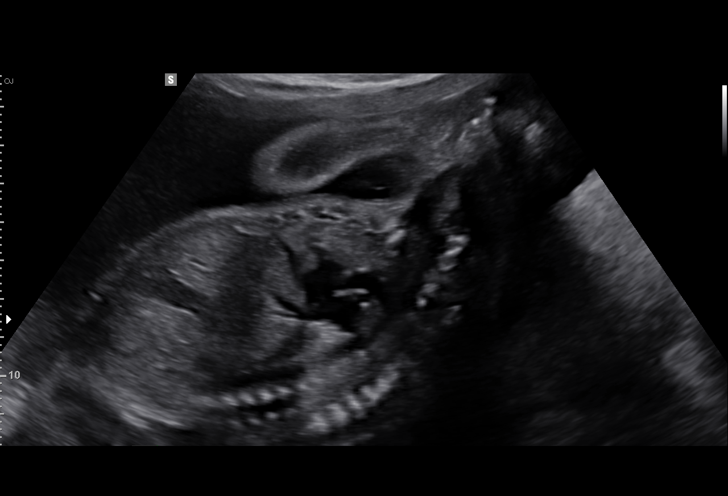

[14 of 28 positions shown; findings below may reference images not displayed]

[REDACTED]

1  GILGIL            [PHONE_NUMBER]      [PHONE_NUMBER]     [PHONE_NUMBER]
Indications

25 weeks gestation of pregnancy
Advanced maternal age multigravida (34);       [H3]
second trimester- low risk NIPS
Hypertension - Chronic/Pre-existing- atenolol  [H3]
OB History

Blood Type:            Height:  5'4"   Weight (lb):  194      BMI:
Gravidity:    7         Term:   3        Prem:   0        SAB:   1
TOP:          2       Ectopic:  0        Living: 3
Fetal Evaluation

Num Of Fetuses:     1
Fetal Heart         143
Rate(bpm):
Cardiac Activity:   Observed
Presentation:       Cephalic
Placenta:           Posterior, above cervical os
P. Cord Insertion:  Previously Visualized

Amniotic Fluid
AFI FV:      Subjectively within normal limits

Largest Pocket(cm)
5.5
Biometry
BPD:      64.5  mm     G. Age:  26w 1d         69  %    CI:        72.73   %   70 - 86
FL/HC:      18.6   %   18.7 -
HC:      240.5  mm     G. Age:  26w 1d         58  %    HC/AC:      1.04       1.04 -
AC:      230.9  mm     G. Age:  27w 3d         93  %    FL/BPD:     69.3   %   71 - 87
FL:       44.7  mm     G. Age:  24w 5d         22  %    FL/AC:      19.4   %   20 - 24
HUM:      42.5  mm     G. Age:  25w 4d         49  %
CER:      31.2  mm     G. Age:  27w 1d         90  %

Est. FW:     918  gm          2 lb      74  %
Gestational Age

LMP:           27w 2d       Date:   [DATE]                 EDD:   [DATE]
U/S Today:     26w 1d                                        EDD:   [DATE]
Best:          25w 2d    Det. By:   Early Ultrasound         EDD:   [DATE]
([DATE])
Anatomy

Cranium:               Appears normal         Aortic Arch:            Appears normal
Cavum:                 Appears normal         Ductal Arch:            Previously seen
Ventricles:            Appears normal         Diaphragm:              Previously seen
Choroid Plexus:        Previously seen        Stomach:                Appears normal, left
sided
Cerebellum:            Appears normal         Abdomen:                Appears normal
Posterior Fossa:       Appears normal         Abdominal Wall:         Previously seen
Nuchal Fold:           Appears normal         Cord Vessels:           Appears normal (3
vessel cord)
Face:                  Orbits and profile     Kidneys:                Appear normal
previously seen
Lips:                  Previously seen        Bladder:                Appears normal
Thoracic:              Appears normal         Spine:                  Previously seen
Heart:                 Appears normal         Upper Extremities:      Previously seen
(4CH, axis, and situs
RVOT:                  Previously seen        Lower Extremities:      Previously seen
LVOT:                  Appears normal

Other:  Fetus appears to be a male. Heels and 5th digit and NB prev
visualized..
Cervix Uterus Adnexa

Cervix
Length:            3.3  cm.
Normal appearance by transabdominal scan.

Uterus
No abnormality visualized.

Left Ovary
Not visualized. No adnexal mass visualized.

Right Ovary
Not visualized. No adnexal mass visualized.
Impression

SIUP at 25+2 weeks
Normal interval anatomy; anatomic survey complete
Normal amniotic fluid volume
Appropriate interval growth with EFW at the 74th %tile
Recommendations

Follow-up ultrasound for growth in 4 weeks (cHTN)

## 2016-02-08 ENCOUNTER — Ambulatory Visit (INDEPENDENT_AMBULATORY_CARE_PROVIDER_SITE_OTHER): Payer: 59 | Admitting: Family Medicine

## 2016-02-08 VITALS — BP 122/74 | HR 97 | Wt 200.0 lb

## 2016-02-08 DIAGNOSIS — O26892 Other specified pregnancy related conditions, second trimester: Secondary | ICD-10-CM

## 2016-02-08 DIAGNOSIS — O10012 Pre-existing essential hypertension complicating pregnancy, second trimester: Secondary | ICD-10-CM

## 2016-02-08 DIAGNOSIS — O26899 Other specified pregnancy related conditions, unspecified trimester: Secondary | ICD-10-CM

## 2016-02-08 DIAGNOSIS — G5600 Carpal tunnel syndrome, unspecified upper limb: Secondary | ICD-10-CM | POA: Insufficient documentation

## 2016-02-08 DIAGNOSIS — O10013 Pre-existing essential hypertension complicating pregnancy, third trimester: Secondary | ICD-10-CM

## 2016-02-08 DIAGNOSIS — O99212 Obesity complicating pregnancy, second trimester: Secondary | ICD-10-CM

## 2016-02-08 DIAGNOSIS — O99210 Obesity complicating pregnancy, unspecified trimester: Secondary | ICD-10-CM

## 2016-02-08 DIAGNOSIS — O0993 Supervision of high risk pregnancy, unspecified, third trimester: Secondary | ICD-10-CM

## 2016-02-08 DIAGNOSIS — O09523 Supervision of elderly multigravida, third trimester: Secondary | ICD-10-CM

## 2016-02-08 DIAGNOSIS — O09892 Supervision of other high risk pregnancies, second trimester: Secondary | ICD-10-CM

## 2016-02-08 DIAGNOSIS — O09522 Supervision of elderly multigravida, second trimester: Secondary | ICD-10-CM

## 2016-02-08 NOTE — Progress Notes (Signed)
Subjective:  [name] is a 35 y.o. [id number] at [redacted]w[redacted]d being seen today for ongoing prenatal care.  She is currently monitored for the following issues for this high-risk pregnancy and has HTN (hypertension), benign; Hypertriglyceridemia; Supervision of high risk pregnancy, antepartum; Hypertension in pregnancy, essential, antepartum; AMA (advanced maternal age) multigravida 65+; Desires sterilization; Obesity in pregnancy; and Carpal tunnel syndrome during pregnancy on her problem list.  Patient reports no complaints.  Contractions: Not present. Vag. Bleeding: None.  Movement: Present. Denies leaking of fluid.   The following portions of the patient's history were reviewed and updated as appropriate: allergies, current medications, past family history, past medical history, past social history, past surgical history and problem list. Problem list updated.  Objective:   Vitals:   02/08/16 0907  BP: 122/74  Pulse: 97  Weight: 200 lb (90.7 kg)    Fetal Status: Fetal Heart Rate (bpm): 134 Fundal Height: 26 cm Movement: Present     General:  Alert, oriented and cooperative. Patient is in no acute distress.  Skin: Skin is warm and dry. No rash noted.   Cardiovascular: Normal heart rate noted  Respiratory: Normal respiratory effort, no problems with respiration noted  Abdomen: Soft, gravid, appropriate for gestational age. Pain/Pressure: Present     Pelvic:  Cervical exam deferred        Extremities: Normal range of motion.  Edema: Trace  Mental Status: Normal mood and affect. Normal behavior. Normal judgment and thought content.   Urinalysis:      Assessment and Plan:  Pregnancy: [id number] at [redacted]w[redacted]d  1.Hypertension in pregnancy, essential, antepartum, third trimester BP wnl Will need NSTs   3. Supervision of high risk pregnancy, antepartum, third trimester UTD, need glucola next visit  4. AMA (advanced maternal age) multigravida 23+, third trimester  5. Obesity in  pregnancy Weight gain -15#, slightly above goal.   6. Carpal tunnel - recommended brace use 24 hr per day - Next visit if stil sx, consider steroid dose pack  Preterm labor symptoms and general obstetric precautions including but not limited to vaginal bleeding, contractions, leaking of fluid and fetal movement were reviewed in detail with the patient. Please refer to After Visit Summary for other counseling recommendations.  Return in about 4 weeks (around 03/07/2016) for Routine prenatal care, glucola.   Caren Macadam, MD

## 2016-02-08 NOTE — Patient Instructions (Signed)

## 2016-02-10 ENCOUNTER — Telehealth: Payer: Self-pay | Admitting: *Deleted

## 2016-02-10 DIAGNOSIS — M25569 Pain in unspecified knee: Secondary | ICD-10-CM

## 2016-02-10 NOTE — Telephone Encounter (Signed)
-----   Message from Francia Greaves sent at 02/10/2016  8:23 AM EDT ----- Regarding: Referral Request Contact: [phone] Has done "something" to her knee, wants [place] to put in a referral to [facility] orthopedic specialist Office Fax [phone] She states they won't see her without it coming from [place] because she is pregnant

## 2016-02-10 NOTE — Telephone Encounter (Signed)
Pt requesting referral to orthopedics for knee pain, order placed.

## 2016-02-11 ENCOUNTER — Encounter (HOSPITAL_COMMUNITY): Payer: Self-pay | Admitting: *Deleted

## 2016-02-11 ENCOUNTER — Inpatient Hospital Stay (HOSPITAL_COMMUNITY)
Admission: AD | Admit: 2016-02-11 | Discharge: 2016-02-12 | Disposition: A | Discharge status: Home / Self Care | Payer: 59 | Source: Ambulatory Visit | Attending: Obstetrics & Gynecology | Admitting: Obstetrics & Gynecology

## 2016-02-11 DIAGNOSIS — O162 Unspecified maternal hypertension, second trimester: Secondary | ICD-10-CM | POA: Diagnosis not present

## 2016-02-11 DIAGNOSIS — E876 Hypokalemia: Secondary | ICD-10-CM | POA: Diagnosis not present

## 2016-02-11 DIAGNOSIS — O10912 Unspecified pre-existing hypertension complicating pregnancy, second trimester: Secondary | ICD-10-CM

## 2016-02-11 DIAGNOSIS — O1202 Gestational edema, second trimester: Secondary | ICD-10-CM

## 2016-02-11 DIAGNOSIS — O99332 Smoking (tobacco) complicating pregnancy, second trimester: Secondary | ICD-10-CM | POA: Insufficient documentation

## 2016-02-11 DIAGNOSIS — F17210 Nicotine dependence, cigarettes, uncomplicated: Secondary | ICD-10-CM | POA: Insufficient documentation

## 2016-02-11 DIAGNOSIS — D509 Iron deficiency anemia, unspecified: Secondary | ICD-10-CM

## 2016-02-11 DIAGNOSIS — D649 Anemia, unspecified: Secondary | ICD-10-CM | POA: Diagnosis not present

## 2016-02-11 DIAGNOSIS — G5600 Carpal tunnel syndrome, unspecified upper limb: Secondary | ICD-10-CM

## 2016-02-11 DIAGNOSIS — O26892 Other specified pregnancy related conditions, second trimester: Secondary | ICD-10-CM | POA: Insufficient documentation

## 2016-02-11 DIAGNOSIS — O99282 Endocrine, nutritional and metabolic diseases complicating pregnancy, second trimester: Secondary | ICD-10-CM | POA: Insufficient documentation

## 2016-02-11 DIAGNOSIS — R0602 Shortness of breath: Secondary | ICD-10-CM | POA: Diagnosis present

## 2016-02-11 DIAGNOSIS — O99012 Anemia complicating pregnancy, second trimester: Secondary | ICD-10-CM | POA: Insufficient documentation

## 2016-02-11 DIAGNOSIS — O9912 Other diseases of the blood and blood-forming organs and certain disorders involving the immune mechanism complicating childbirth: Secondary | ICD-10-CM | POA: Diagnosis not present

## 2016-02-11 DIAGNOSIS — Z3A26 26 weeks gestation of pregnancy: Secondary | ICD-10-CM | POA: Insufficient documentation

## 2016-02-11 DIAGNOSIS — Z3492 Encounter for supervision of normal pregnancy, unspecified, second trimester: Secondary | ICD-10-CM

## 2016-02-11 DIAGNOSIS — O99210 Obesity complicating pregnancy, unspecified trimester: Secondary | ICD-10-CM

## 2016-02-11 DIAGNOSIS — O26899 Other specified pregnancy related conditions, unspecified trimester: Secondary | ICD-10-CM

## 2016-02-11 LAB — COMPREHENSIVE METABOLIC PANEL
ALK PHOS: 51 U/L (ref 38–126)
ALT: 10 U/L — AB (ref 14–54)
AST: 13 U/L — AB (ref 15–41)
Albumin: 3 g/dL — ABNORMAL LOW (ref 3.5–5.0)
Anion gap: 7 (ref 5–15)
BILIRUBIN TOTAL: 0.2 mg/dL — AB (ref 0.3–1.2)
BUN: 8 mg/dL (ref 6–20)
CALCIUM: 9 mg/dL (ref 8.9–10.3)
CO2: 23 mmol/L (ref 22–32)
CREATININE: 0.6 mg/dL (ref 0.44–1.00)
Chloride: 106 mmol/L (ref 101–111)
Glucose, Bld: 96 mg/dL (ref 65–99)
Potassium: 3 mmol/L — ABNORMAL LOW (ref 3.5–5.1)
Sodium: 136 mmol/L (ref 135–145)
Total Protein: 6.8 g/dL (ref 6.5–8.1)

## 2016-02-11 LAB — CBC
HEMATOCRIT: 27.6 % — AB (ref 36.0–46.0)
HEMOGLOBIN: 9.9 g/dL — AB (ref 12.0–15.0)
MCH: 30.5 pg (ref 26.0–34.0)
MCHC: 35.9 g/dL (ref 30.0–36.0)
MCV: 84.9 fL (ref 78.0–100.0)
Platelets: 209 10*3/uL (ref 150–400)
RBC: 3.25 MIL/uL — AB (ref 3.87–5.11)
RDW: 13.2 % (ref 11.5–15.5)
WBC: 10.6 10*3/uL — AB (ref 4.0–10.5)

## 2016-02-11 LAB — URINALYSIS, ROUTINE W REFLEX MICROSCOPIC
BILIRUBIN URINE: NEGATIVE
Glucose, UA: NEGATIVE mg/dL
Hgb urine dipstick: NEGATIVE
Ketones, ur: NEGATIVE mg/dL
LEUKOCYTES UA: NEGATIVE
NITRITE: NEGATIVE
PH: 6 (ref 5.0–8.0)
Protein, ur: NEGATIVE mg/dL

## 2016-02-11 LAB — PROTEIN / CREATININE RATIO, URINE: CREATININE, URINE: 71 mg/dL

## 2016-02-11 NOTE — MAU Note (Signed)
Patient states she has been short of breath for last few days.  Also c/o increased swelling and that she has gained a few pounds in last few days.

## 2016-02-12 DIAGNOSIS — D649 Anemia, unspecified: Secondary | ICD-10-CM | POA: Diagnosis not present

## 2016-02-12 DIAGNOSIS — Z3A26 26 weeks gestation of pregnancy: Secondary | ICD-10-CM

## 2016-02-12 DIAGNOSIS — O9912 Other diseases of the blood and blood-forming organs and certain disorders involving the immune mechanism complicating childbirth: Secondary | ICD-10-CM

## 2016-02-12 MED ORDER — POTASSIUM CHLORIDE CRYS ER 20 MEQ PO TBCR
40.0000 meq | EXTENDED_RELEASE_TABLET | Freq: Once | ORAL | Status: AC
  Administered 2016-02-12: 40 meq via ORAL
  Filled 2016-02-12: qty 2

## 2016-02-12 MED ORDER — FERROUS SULFATE 325 (65 FE) MG PO TABS: 325.0000 mg | ORAL_TABLET | Freq: Every day | ORAL | 3 refills | Status: DC

## 2016-02-12 NOTE — MAU Note (Signed)
Pt ambulated in hall with pulse ox. Pulse remained around 100 and pulse ox 99 while ambulating. Pt tol well and did not worsen SOB with ambulation. Dr Vanetta Shawl on unit and aware.

## 2016-02-12 NOTE — Discharge Instructions (Signed)
Shortness of Breath Shortness of breath means you have trouble breathing. Shortness of breath needs medical care right away. HOME CARE   Do not smoke.  Avoid being around chemicals or things (paint fumes, dust) that may bother your breathing.  Rest as needed. Slowly begin your normal activities.  Only take medicines as told by your doctor.  Keep all doctor visits as told. GET HELP RIGHT AWAY IF:   Your shortness of breath gets worse.  You feel lightheaded, pass out (faint), or have a cough that is not helped by medicine.  You cough up blood.  You have pain with breathing.  You have pain in your chest, arms, shoulders, or belly (abdomen).  You have a fever.  You cannot walk up stairs or exercise the way you normally do.  You do not get better in the time expected.  You have a hard time doing normal activities even with rest.  You have problems with your medicines.  You have any new symptoms. MAKE SURE YOU:  Understand these instructions.  Will watch your condition.  Will get help right away if you are not doing well or get worse.   This information is not intended to replace advice given to you by your health care provider. Make sure you discuss any questions you have with your health care provider.   Document Released: 12/05/2007 Document Revised: 06/23/2013 Document Reviewed: 09/03/2011 Elsevier Interactive Patient Education 2016 [employer].   Pregnancy and Anemia Anemia is a condition in which the concentration of red blood cells or hemoglobin in the blood is below normal. Hemoglobin is a substance in red blood cells that carries oxygen to the tissues of the body. Anemia results in not enough oxygen reaching these tissues.  Anemia during pregnancy is common because the fetus uses more iron and folic acid as it is developing. Your body may not produce enough red blood cells because of this. Also, during pregnancy, the liquid part of the blood (plasma) increases  by about 50%, and the red blood cells increase by only 25%. This lowers the concentration of the red blood cells and creates a natural anemia-like situation.  CAUSES  The most common cause of anemia during pregnancy is not having enough iron in the body to make red blood cells (iron deficiency anemia). Other causes may include:  Folic acid deficiency.  Vitamin B12 deficiency.  Certain prescription or over-the-counter medicines.  Certain medical conditions or infections that destroy red blood cells.  A low platelet count and bleeding caused by antibodies that go through the placenta to the fetus from the mother's blood. SIGNS AND SYMPTOMS  Mild anemia may not be noticeable. If it becomes severe, symptoms may include:  Tiredness.  Shortness of breath, especially with exercise.  Weakness.  Fainting.  Pale looking skin.  Headaches.  Feeling a fast or irregular heartbeat (palpitations). DIAGNOSIS  The type of anemia is usually diagnosed from your family and medical history and blood tests. TREATMENT  Treatment of anemia during pregnancy depends on the cause of the anemia. Treatment can include:  Supplements of iron, vitamin 123456, or folic acid.  A blood transfusion. This may be needed if blood loss is severe.  Hospitalization. This may be needed if there is significant continual blood loss.  Dietary changes. HOME CARE INSTRUCTIONS   Follow your dietitian's or health care provider's dietary recommendations.  Increase your vitamin C intake. This will help the stomach absorb more iron.  Eat a diet rich in iron. This  would include foods such as:  Liver.  Beef.  Whole grain bread.  Eggs.  Dried fruit.  Take iron and vitamins as directed by your health care provider.  Eat green leafy vegetables. These are a good source of folic acid. SEEK MEDICAL CARE IF:   You have frequent or lasting headaches.  You are looking pale.  You are bruising easily. SEEK IMMEDIATE  MEDICAL CARE IF:   You have extreme weakness, shortness of breath, or chest pain.  You become dizzy or have trouble concentrating.  You have heavy vaginal bleeding.  You develop a rash.  You have bloody or black, tarry stools.  You faint.  You vomit up blood.  You vomit repeatedly.  You have abdominal pain.  You have a fever or persistent symptoms for more than 2-3 days.  You have a fever and your symptoms suddenly get worse.  You are dehydrated. MAKE SURE YOU:   Understand these instructions.  Will watch your condition.  Will get help right away if you are not doing well or get worse.   This information is not intended to replace advice given to you by your health care provider. Make sure you discuss any questions you have with your health care provider.   Document Released: 06/15/2000 Document Revised: 04/08/2013 Document Reviewed: 01/28/2013 Elsevier Interactive Patient Education 2016 [place].   Potassium Content of Foods Potassium is a mineral found in many foods and drinks. It helps keep fluids and minerals balanced in your body and affects how steadily your heart beats. Potassium also helps control your blood pressure and keep your muscles and nervous system healthy. Certain health conditions and medicines may change the balance of potassium in your body. When this happens, you can help balance your level of potassium through the foods that you do or do not eat. Your health care provider or dietitian may recommend an amount of potassium that you should have each day. The following lists of foods provide the amount of potassium (in parentheses) per serving in each item. HIGH IN POTASSIUM  The following foods and beverages have 200 mg or more of potassium per serving:  Apricots, 2 raw or 5 dry (200 mg).  Artichoke, 1 medium (345 mg).  Avocado, raw,  each (245 mg).  Banana, 1 medium (425 mg).  Beans, lima, or baked beans, canned,  cup (280  mg).  Beans, white, canned,  cup (595 mg).  Beef roast, 3 oz (320 mg).  Beef, ground, 3 oz (270 mg).  Beets, raw or cooked,  cup (260 mg).  Bran muffin, 2 oz (300 mg).  Broccoli,  cup (230 mg).  Brussels sprouts,  cup (250 mg).  Cantaloupe,  cup (215 mg).  Cereal, 100% bran,  cup (200-400 mg).  Cheeseburger, single, fast food, 1 each (225-400 mg).  Chicken, 3 oz (220 mg).  Clams, canned, 3 oz (535 mg).  Crab, 3 oz (225 mg).  Dates, 5 each (270 mg).  Dried beans and peas,  cup (300-475 mg).  Figs, dried, 2 each (260 mg).  Fish: halibut, tuna, cod, snapper, 3 oz (480 mg).  Fish: salmon, haddock, swordfish, perch, 3 oz (300 mg).  Fish, tuna, canned 3 oz (200 mg).  [place] fries, fast food, 3 oz (470 mg).  Granola with fruit and nuts,  cup (200 mg).  Grapefruit juice,  cup (200 mg).  Greens, beet,  cup (655 mg).  Honeydew melon,  cup (200 mg).  Kale, raw, 1 cup (300 mg).  Kiwi,  1 medium (240 mg).  Kohlrabi, rutabaga, parsnips,  cup (280 mg).  Lentils,  cup (365 mg).  Mango, 1 each (325 mg).  Milk, chocolate, 1 cup (420 mg).  Milk: nonfat, low-fat, whole, buttermilk, 1 cup (350-380 mg).  Molasses, 1 Tbsp (295 mg).  Mushrooms,  cup (280) mg.  Nectarine, 1 each (275 mg).  Nuts: almonds, peanuts, hazelnuts, [place], cashew, mixed, 1 oz (200 mg).  Nuts, pistachios, 1 oz (295 mg).  Orange, 1 each (240 mg).  Orange juice,  cup (235 mg).  Papaya, medium,  fruit (390 mg).  Peanut butter, chunky, 2 Tbsp (240 mg).  Peanut butter, smooth, 2 Tbsp (210 mg).  Pear, 1 medium (200 mg).  Pomegranate, 1 whole (400 mg).  Pomegranate juice,  cup (215 mg).  Pork, 3 oz (350 mg).  Potato chips, salted, 1 oz (465 mg).  Potato, baked with skin, 1 medium (925 mg).  Potatoes, boiled,  cup (255 mg).  Potatoes, mashed,  cup (330 mg).  Prune juice,  cup (370 mg).  Prunes, 5 each (305 mg).  Pudding, chocolate,  cup (230  mg).  Pumpkin, canned,  cup (250 mg).  Raisins, seedless,  cup (270 mg).  Seeds, sunflower or pumpkin, 1 oz (240 mg).  Soy milk, 1 cup (300 mg).  Spinach,  cup (420 mg).  Spinach, canned,  cup (370 mg).  Sweet potato, baked with skin, 1 medium (450 mg).  Swiss chard,  cup (480 mg).  Tomato or vegetable juice,  cup (275 mg).  Tomato sauce or puree,  cup (400-550 mg).  Tomato, raw, 1 medium (290 mg).  Tomatoes, canned,  cup (200-300 mg).  [place], 3 oz (250 mg).  Wheat germ, 1 oz (250 mg).  Winter squash,  cup (250 mg).  Yogurt, plain or fruited, 6 oz (260-435 mg).  Zucchini,  cup (220 mg). MODERATE IN POTASSIUM The following foods and beverages have 50-200 mg of potassium per serving:  Apple, 1 each (150 mg).  Apple juice,  cup (150 mg).  Applesauce,  cup (90 mg).  Apricot nectar,  cup (140 mg).  Asparagus, small spears,  cup or 6 spears (155 mg).  Bagel, cinnamon raisin, 1 each (130 mg).  Bagel, egg or plain, 4 in., 1 each (70 mg).  Beans, green,  cup (90 mg).  Beans, yellow,  cup (190 mg).  Beer, regular, 12 oz (100 mg).  Beets, canned,  cup (125 mg).  Blackberries,  cup (115 mg).  Blueberries,  cup (60 mg).  Bread, whole wheat, 1 slice (70 mg).  Broccoli, raw,  cup (145 mg).  Cabbage,  cup (150 mg).  Carrots, cooked or raw,  cup (180 mg).  Cauliflower, raw,  cup (150 mg).  Celery, raw,  cup (155 mg).  Cereal, bran flakes, cup (120-150 mg).  Cheese, cottage,  cup (110 mg).  Cherries, 10 each (150 mg).  Chocolate, 1 oz bar (165 mg).  Coffee, brewed 6 oz (90 mg).  Corn,  cup or 1 ear (195 mg).  Cucumbers,  cup (80 mg).  Egg, large, 1 each (60 mg).  Eggplant,  cup (60 mg).  Endive, raw, cup (80 mg).  English muffin, 1 each (65 mg).  Fish, orange roughy, 3 oz (150 mg).  Frankfurter, beef or pork, 1 each (75 mg).  Fruit cocktail,  cup (115 mg).  Grape juice,  cup (170  mg).  Grapefruit,  fruit (175 mg).  Grapes,  cup (155 mg).  Greens: kale, turnip, collard,  cup (110-150  mg).  Ice cream or frozen yogurt, chocolate,  cup (175 mg).  Ice cream or frozen yogurt, vanilla,  cup (120-150 mg).  Lemons, limes, 1 each (80 mg).  Lettuce, all types, 1 cup (100 mg).  Mixed vegetables,  cup (150 mg).  Mushrooms, raw,  cup (110 mg).  Nuts: walnuts, pecans, or macadamia, 1 oz (125 mg).  Oatmeal,  cup (80 mg).  Okra,  cup (110 mg).  Onions, raw,  cup (120 mg).  Peach, 1 each (185 mg).  Peaches, canned,  cup (120 mg).  Pears, canned,  cup (120 mg).  Peas, green, frozen,  cup (90 mg).  Peppers, green,  cup (130 mg).  Peppers, red,  cup (160 mg).  Pineapple juice,  cup (165 mg).  Pineapple, fresh or canned,  cup (100 mg).  Plums, 1 each (105 mg).  Pudding, vanilla,  cup (150 mg).  Raspberries,  cup (90 mg).  Rhubarb,  cup (115 mg).  Rice, wild,  cup (80 mg).  Shrimp, 3 oz (155 mg).  Spinach, raw, 1 cup (170 mg).  Strawberries,  cup (125 mg).  Summer squash  cup (175-200 mg).  Swiss chard, raw, 1 cup (135 mg).  Tangerines, 1 each (140 mg).  Tea, brewed, 6 oz (65 mg).  Turnips,  cup (140 mg).  Watermelon,  cup (85 mg).  Wine, red, table, 5 oz (180 mg).  Wine, white, table, 5 oz (100 mg). LOW IN POTASSIUM The following foods and beverages have less than 50 mg of potassium per serving.  Bread, white, 1 slice (30 mg).  Carbonated beverages, 12 oz (less than 5 mg).  Cheese, 1 oz (20-30 mg).  Cranberries,  cup (45 mg).  Cranberry juice cocktail,  cup (20 mg).  Fats and oils, 1 Tbsp (less than 5 mg).  Hummus, 1 Tbsp (32 mg).  Nectar: papaya, mango, or pear,  cup (35 mg).  Rice, white or brown,  cup (50 mg).  Spaghetti or macaroni,  cup cooked (30 mg).  Tortilla, flour or corn, 1 each (50 mg).  Waffle, 4 in., 1 each (50 mg).  Water chestnuts,  cup (40 mg).   This information  is not intended to replace advice given to you by your health care provider. Make sure you discuss any questions you have with your health care provider.   Document Released: 01/30/2005 Document Revised: 06/23/2013 Document Reviewed: 05/15/2013 Elsevier Interactive Patient Education [employer].

## 2016-02-12 NOTE — MAU Provider Note (Signed)
History     CSN: [id number]  Arrival date and time: 02/11/16 2214   First Provider Initiated Contact with Patient 02/12/16 0056      Chief Complaint  Patient presents with  . Leg Swelling  . Shortness of Breath   HPI   35 y/o [name] at 52 1/7 weeks here for SOB and edema.  Symptoms began this AM, first with swelling and 5 lb weight gain. Swelling of feet, hands, face. Then she noticed SOB, especially with exertion. Increased fatigue. No CP, tachycardia. No h/o blood clots, no calf pain or tenderness. No changes in vision. Mild headache that went away with tylenol.  +tingling in lips/mouth.     OB History    Gravida Para Term Preterm AB Living   7 3 3  0 3 3   SAB TAB Ectopic Multiple Live Births   1 2 0 0 3         Past Medical History:  Diagnosis Date  . Gestational diabetes    with 1st pregnancy  . Hypertension   . Migraine   . Urinary tract infection     Past Surgical History:  Procedure Laterality Date  . CHOLECYSTECTOMY N/A 11/2013  . DILATION AND EVACUATION  03/09/2011   Procedure: DILATATION AND EVACUATION (D&E);  Surgeon: Donnamae Jude, MD;  Location: [facility];  Service: Gynecology;  Laterality: N/A;  . TONSILLECTOMY  1988  . Tubes in ears  Baby    Family History  Problem Relation Age of Onset  . Hypertension Father   . Heart failure Father   . Hepatitis Father   . Cirrhosis Father   . Hyperlipidemia Father   . Migraines Father   . Mental illness Father     Depression  . Diabetes Mother   . Osteoarthritis Mother   . Hypertension Mother   . Hypothyroidism Mother   . Heart disease Mother   . Hyperlipidemia Mother   . Migraines Mother   . Mental illness Mother     Depression  . Breast cancer Maternal Aunt 70  . Cancer Sister 59    Cervical  . Heart disease Sister     Heart Stops  . Migraines Sister   . Seizures Sister 11    unsure if this or heart problem  . Mental illness Sister     depression  . Heart disease Maternal Grandmother      Social History  Substance Use Topics  . Smoking status: Current Some Day Smoker    Packs/day: 0.50    Years: 12.00    Types: Cigarettes  . Smokeless tobacco: Never Used  . Alcohol use 0.5 oz/week    1 Standard drinks or equivalent per week     Comment: prior to pregnancy    Allergies: No Known Allergies  Prescriptions Prior to Admission  Medication Sig Dispense Refill Last Dose  . acetaminophen (TYLENOL) 500 MG tablet Take 1,000 mg by mouth every 6 (six) hours as needed for pain.   Taking  . aspirin EC 81 MG tablet Take 1 tablet (81 mg total) by mouth daily. Take after 12 weeks for prevention of preeclampsia later in pregnancy 300 tablet 2 Taking  . atenolol (TENORMIN) 50 MG tablet TAKE 1 TABLET BY MOUTH EVERY DAY 30 tablet 3 Taking  . flintstones complete (FLINTSTONES) 60 MG chewable tablet Chew 2 tablets by mouth daily.   Taking    ROS   REVIEW OF SYSTEMS  OPHTHALMIC: negative for - blurry vision, decreased vision, double  vision, photophobia or scotomata RESPIRATORY: no cough, or wheezing, +SOB CARDIOVASCULAR: no chest pain or dyspnea on exertion GASTROINTESTINAL: no abdominal pain, change in bowel habits, or black or bloody stools negative for - epigastric or RUQ pain GENITO-URINARY: no dysuria, trouble voiding, or hematuria negative for - genital discharge, vulvar/vaginal symptoms or vaginal bleeding MUSKULOSKELETAL: negative for - gait disturbance; + swelling in ankle - bilateral, foot - bilateral, hands- bilateral, face and minimal to no swelling in leg - bilateral NEUROLOGICAL: negative for - dizziness, gait disturbance, visual changes. headaches (mild, resolve with tylenol), numbness/tingling of lips  DERMATOLOGICAL: negative OBSTETRICAL: No vaginal bleeding, no leaking fluid, no contractions. Good fetal movement.  Physical Exam   Blood pressure 132/70, pulse 88, temperature 98.5 F (36.9 C), resp. rate 18, height 5\' 4"  (1.626 m), weight 202 lb (91.6 kg), last  menstrual period 07/30/2015, SpO2 97 %, unknown if currently breastfeeding.  Physical Exam  Constitutional: She is oriented to person, place, and time. She appears well-developed and well-nourished.  HEENT: Non-icteric; EOMI; Normocephalic.  Cardiovascular: Normal rate, regular rhythm and normal heart sounds, no murmurs, rubs, gallops.  Pulmonary/Chest: Effort normal and breath sounds normal. CTAB. Abdominal: Soft. Non-tender. Gravid. Neurological: She is alert and oriented to person, place, and time. She has normal reflexes.  Skin: Skin is warm and dry.  Musculoskeletal: Trace edema to mid-calf. Steady gait. Negative Homen's sign, neg calf tenderness. Psychiatric: She has a normal mood and affect. Her behavior is normal, slightly anxious. Judgment and thought content normal.   MAU Course  Procedures  Ambulating pulse ox - WNL Resting pulse ox - WNL Labs (CBC/CMP/UPC) - Hb 13 -> 9 Vitals  I personally reviewed the patient's NST today, found to be REACTIVE. Continue with routine prenatal care.  MDM Plan of care reviewed with patient, including labs and tests ordered and medical treatment. Preterm labor precautions given. Reviewed signs/symptoms of preeclampsia and blood clots.    Assessment and Plan   SOB 2/2 Anemia in pregnancy. Hypokalemia  S/p 40 meq KDur Reviewed preterm labor precautions, signs/symptoms of preeclampsia, and blood clots Continue PNV, atenolol, and ASA 81mg  F/U OB provider for repeat testing if necessary Iron supplements given to take at home.    Katherine Basset 02/12/2016, 12:58 AM

## 2016-02-14 ENCOUNTER — Other Ambulatory Visit (INDEPENDENT_AMBULATORY_CARE_PROVIDER_SITE_OTHER): Payer: 59

## 2016-02-14 DIAGNOSIS — O368130 Decreased fetal movements, third trimester, not applicable or unspecified: Secondary | ICD-10-CM | POA: Diagnosis not present

## 2016-02-14 NOTE — Progress Notes (Signed)
Subjective:  [name] is a 35 y.o. [id number] at [redacted]w[redacted]d being seen today for ongoing prenatal care.  Patient reports decreased fetal movement. Denies leaking of fluid.  She has had a significant but temporary change in her living situation. Her husband will not be available to help for at least the next 30 days, possibly 60 days. He will be in another state until further notice and she is having difficulty coping with this. She states she "can't stop crying". Her mother is with her to help but had recent eye surgery and can't do much.  She expresses the need to talk to a counselor about these current issues.   The following portions of the patient's history were reviewed and updated as appropriate: allergies, current medications, past family history, past medical history, past social history, past surgical history and problem list.   Objective:   Fetal Status: FHR 134   Assessment and Plan:  Pregnancy: [id number] at [redacted]w[redacted]d  There are no diagnoses linked to this encounter. Preterm labor symptoms and general obstetric precautions including but not limited to vaginal bleeding, contractions, leaking of fluid and fetal movement were reviewed in detail with the patient.  Appointment made with [facility] clinician at [facility] for 02/15/16 at 0900.    Gretchen Short, CMA

## 2016-02-15 ENCOUNTER — Ambulatory Visit (INDEPENDENT_AMBULATORY_CARE_PROVIDER_SITE_OTHER): Payer: 59 | Admitting: Clinical

## 2016-02-15 DIAGNOSIS — F4323 Adjustment disorder with mixed anxiety and depressed mood: Secondary | ICD-10-CM

## 2016-02-15 NOTE — Progress Notes (Signed)
  ASSESSMENT: Pt currently experiencing Adjustment disorder with mixed anxious and depressed mood. Pt needs to f/u with OB and [facility].  Pt would benefit from psychoeducation and brief therapeutic interventions regarding coping with symptoms of anxiety and depression.Pt may benefit from community resources. Stage of Change: contemplative  PLAN: 1. F/U with behavioral health clinician in one month, or as needed 2. Psychiatric Medications: none 3. Behavioral recommendations:   -Remember importance of self-care for overall wellbeing -Write down list of all current life stressors, as they come to mind, within next 24 hours -Consider marking each item on list that will take 15 minutes or less to complete separately -Complete short-term disability paperwork within next 24 hours -Consider applying for [facility] within 2 weeks -Consider applying for EBT first week of  -Consider reading educational material regarding coping with symptoms of anxiety and depression   SUBJECTIVE: Pt. referred by Margaretmary Dys for symptoms of depression Pt. reports the following symptoms/concerns: Pt has been feeling overwhelmed in past week, as she is caring for 3 children on her own, after getting husband [place] alcohol rehab [place]. Pt says she experienced past postpartum depression, and that she does not want to feel like that after upcoming birth.  Duration of problem: over one week Severity: moderate  OBJECTIVE: Orientation & Cognition: Oriented x3. Thought processes normal and appropriate to situation. Mood: teary Affect: appropriate Appearance: appropriate Risk of harm to self or others: no known risk of harm to self or others Substance use: none Assessments administered: PHQ9: 14/ GAD7: 15  Diagnosis: Adjusment disorder with mixed anxious and depressed mood CPT Code: F43.23  -------------------------------------------- Other(s) present in the room: none  Time spent with patient in exam room: 60  minutes 9:20 to 10:20am  Depression screen PHQ 2/9 02/15/2016  Decreased Interest 2  Down, Depressed, Hopeless 2  PHQ - 2 Score 4  Altered sleeping 3  Tired, decreased energy 3  Change in appetite 2  Feeling bad or failure about yourself  1  Trouble concentrating 1  Moving slowly or fidgety/restless 0  Suicidal thoughts 0  PHQ-9 Score 14   GAD 7 : Generalized Anxiety Score 02/15/2016  Nervous, Anxious, on Edge 2  Control/stop worrying 3  Worry too much - different things 3  Trouble relaxing 2  Restless 2  Easily annoyed or irritable 3  Afraid - awful might happen 0  Total GAD 7 Score 15

## 2016-03-06 ENCOUNTER — Ambulatory Visit (HOSPITAL_COMMUNITY): Payer: 59

## 2016-03-06 ENCOUNTER — Ambulatory Visit (INDEPENDENT_AMBULATORY_CARE_PROVIDER_SITE_OTHER): Payer: 59 | Admitting: Family Medicine

## 2016-03-06 VITALS — BP 132/75 | HR 92 | Wt 196.0 lb

## 2016-03-06 DIAGNOSIS — O10013 Pre-existing essential hypertension complicating pregnancy, third trimester: Secondary | ICD-10-CM | POA: Diagnosis not present

## 2016-03-06 DIAGNOSIS — Z23 Encounter for immunization: Secondary | ICD-10-CM

## 2016-03-06 DIAGNOSIS — O0993 Supervision of high risk pregnancy, unspecified, third trimester: Secondary | ICD-10-CM

## 2016-03-06 LAB — CBC
HEMATOCRIT: 32.6 % — AB (ref 35.0–45.0)
HEMOGLOBIN: 11 g/dL — AB (ref 11.7–15.5)
MCH: 29.8 pg (ref 27.0–33.0)
MCHC: 33.7 g/dL (ref 32.0–36.0)
MCV: 88.3 fL (ref 80.0–100.0)
MPV: 10.7 fL (ref 7.5–12.5)
Platelets: 233 10*3/uL (ref 140–400)
RBC: 3.69 MIL/uL — AB (ref 3.80–5.10)
RDW: 13.8 % (ref 11.0–15.0)
WBC: 11.3 10*3/uL — ABNORMAL HIGH (ref 3.8–10.8)

## 2016-03-06 NOTE — Progress Notes (Signed)
   PRENATAL VISIT NOTE  Subjective:  [name] is a 35 y.o. [id number] at [redacted]w[redacted]d being seen today for ongoing prenatal care.  She is currently monitored for the following issues for this high-risk pregnancy and has HTN (hypertension), benign; Hypertriglyceridemia; Supervision of high risk pregnancy, antepartum; Hypertension in pregnancy, essential, antepartum; AMA (advanced maternal age) multigravida 18+; Desires sterilization; Obesity in pregnancy; and Carpal tunnel syndrome during pregnancy on her problem list.  Patient reports no complaints.  Contractions: Irritability. Vag. Bleeding: None.  Movement: Present. Denies leaking of fluid.   The following portions of the patient's history were reviewed and updated as appropriate: allergies, current medications, past family history, past medical history, past social history, past surgical history and problem list. Problem list updated.  Objective:   Vitals:   03/06/16 0846  BP: 132/75  Pulse: 92  Weight: 196 lb (88.9 kg)    Fetal Status: Fetal Heart Rate (bpm): 155   Movement: Present     General:  Alert, oriented and cooperative. Patient is in no acute distress.  Skin: Skin is warm and dry. No rash noted.   Cardiovascular: Normal heart rate noted  Respiratory: Normal respiratory effort, no problems with respiration noted  Abdomen: Soft, gravid, appropriate for gestational age. Pain/Pressure: Absent     Pelvic:  Cervical exam deferred        Extremities: Normal range of motion.  Edema: Trace  Mental Status: Normal mood and affect. Normal behavior. Normal judgment and thought content.   Urinalysis: Urine Protein: Negative Urine Glucose: Negative  Assessment and Plan:  Pregnancy: [name] at [redacted]w[redacted]d  1. Hypertension in pregnancy, essential, antepartum, third trimester Continue atenolol and ASA  2. Supervision of high risk pregnancy, antepartum, third trimester 28 wk labs today - CBC - HIV antibody - RPR - Glucose Tolerance, 1  HR (50g) - Tdap vaccine greater than or equal to 7yo IM  Preterm labor symptoms and general obstetric precautions including but not limited to vaginal bleeding, contractions, leaking of fluid and fetal movement were reviewed in detail with the patient. Please refer to After Visit Summary for other counseling recommendations.  Return in 2 weeks (on 03/20/2016).  Donnamae Jude, MD

## 2016-03-06 NOTE — Patient Instructions (Signed)
Third Trimester of Pregnancy The third trimester is from week 29 through week 42, months 7 through 9. The third trimester is a time when the fetus is growing rapidly. At the end of the ninth month, the fetus is about 20 inches in length and weighs 6-10 pounds.  BODY CHANGES Your body goes through many changes during pregnancy. The changes vary from woman to woman.   Your weight will continue to increase. You can expect to gain 25-35 pounds (11-16 kg) by the end of the pregnancy.  You may begin to get stretch marks on your hips, abdomen, and breasts.  You may urinate more often because the fetus is moving lower into your pelvis and pressing on your bladder.  You may develop or continue to have heartburn as a result of your pregnancy.  You may develop constipation because certain hormones are causing the muscles that push waste through your intestines to slow down.  You may develop hemorrhoids or swollen, bulging veins (varicose veins).  You may have pelvic pain because of the weight gain and pregnancy hormones relaxing your joints between the bones in your pelvis. Backaches may result from overexertion of the muscles supporting your posture.  You may have changes in your hair. These can include thickening of your hair, rapid growth, and changes in texture. Some women also have hair loss during or after pregnancy, or hair that feels dry or thin. Your hair will most likely return to normal after your baby is born.  Your breasts will continue to grow and be tender. A yellow discharge may leak from your breasts called colostrum.  Your belly button may stick out.  You may feel short of breath because of your expanding uterus.  You may notice the fetus "dropping," or moving lower in your abdomen.  You may have a bloody mucus discharge. This usually occurs a few days to a week before labor begins.  Your cervix becomes thin and soft (effaced) near your due date. WHAT TO EXPECT AT YOUR  PRENATAL EXAMS  You will have prenatal exams every 2 weeks until week 36. Then, you will have weekly prenatal exams. During a routine prenatal visit:  You will be weighed to make sure you and the fetus are growing normally.  Your blood pressure is taken.  Your abdomen will be measured to track your baby's growth.  The fetal heartbeat will be listened to.  Any test results from the previous visit will be discussed.  You may have a cervical check near your due date to see if you have effaced. At around 36 weeks, your caregiver will check your cervix. At the same time, your caregiver will also perform a test on the secretions of the vaginal tissue. This test is to determine if a type of bacteria, Group B streptococcus, is present. Your caregiver will explain this further. Your caregiver may ask you:  What your birth plan is.  How you are feeling.  If you are feeling the baby move.  If you have had any abnormal symptoms, such as leaking fluid, bleeding, severe headaches, or abdominal cramping.  If you are using any tobacco products, including cigarettes, chewing tobacco, and electronic cigarettes.  If you have any questions. Other tests or screenings that may be performed during your third trimester include:  Blood tests that check for low iron levels (anemia).  Fetal testing to check the health, activity level, and growth of the fetus. Testing is done if you have certain medical conditions or if   there are problems during the pregnancy.  HIV (human immunodeficiency virus) testing. If you are at high risk, you may be screened for HIV during your third trimester of pregnancy. FALSE LABOR You may feel small, irregular contractions that eventually go away. These are called Braxton Hicks contractions, or false labor. Contractions may last for hours, days, or even weeks before true labor sets in. If contractions come at regular intervals, intensify, or become painful, it is best to be seen  by your caregiver.  SIGNS OF LABOR   Menstrual-like cramps.  Contractions that are 5 minutes apart or less.  Contractions that start on the top of the uterus and spread down to the lower abdomen and back.  A sense of increased pelvic pressure or back pain.  A watery or bloody mucus discharge that comes from the vagina. If you have any of these signs before the 37th week of pregnancy, call your caregiver right away. You need to go to the hospital to get checked immediately. HOME CARE INSTRUCTIONS   Avoid all smoking, herbs, alcohol, and unprescribed drugs. These chemicals affect the formation and growth of the baby.  Do not use any tobacco products, including cigarettes, chewing tobacco, and electronic cigarettes. If you need help quitting, ask your health care provider. You may receive counseling support and other resources to help you quit.  Follow your caregiver's instructions regarding medicine use. There are medicines that are either safe or unsafe to take during pregnancy.  Exercise only as directed by your caregiver. Experiencing uterine cramps is a good sign to stop exercising.  Continue to eat regular, healthy meals.  Wear a good support bra for breast tenderness.  Do not use hot tubs, steam rooms, or saunas.  Wear your seat belt at all times when driving.  Avoid raw meat, uncooked cheese, cat litter boxes, and soil used by cats. These carry germs that can cause birth defects in the baby.  Take your prenatal vitamins.  Take 1500-2000 mg of calcium daily starting at the 20th week of pregnancy until you deliver your baby.  Try taking a stool softener (if your caregiver approves) if you develop constipation. Eat more high-fiber foods, such as fresh vegetables or fruit and whole grains. Drink plenty of fluids to keep your urine clear or pale yellow.  Take warm sitz baths to soothe any pain or discomfort caused by hemorrhoids. Use hemorrhoid cream if your caregiver  approves.  If you develop varicose veins, wear support hose. Elevate your feet for 15 minutes, 3-4 times a day. Limit salt in your diet.  Avoid heavy lifting, wear low heal shoes, and practice good posture.  Rest a lot with your legs elevated if you have leg cramps or low back pain.  Visit your dentist if you have not gone during your pregnancy. Use a soft toothbrush to brush your teeth and be gentle when you floss.  A sexual relationship may be continued unless your caregiver directs you otherwise.  Do not travel far distances unless it is absolutely necessary and only with the approval of your caregiver.  Take prenatal classes to understand, practice, and ask questions about the labor and delivery.  Make a trial run to the hospital.  Pack your hospital bag.  Prepare the baby's nursery.  Continue to go to all your prenatal visits as directed by your caregiver. SEEK MEDICAL CARE IF:  You are unsure if you are in labor or if your water has broken.  You have dizziness.  You have   mild pelvic cramps, pelvic pressure, or nagging pain in your abdominal area.  You have persistent nausea, vomiting, or diarrhea.  You have a bad smelling vaginal discharge.  You have pain with urination. SEEK IMMEDIATE MEDICAL CARE IF:   You have a fever.  You are leaking fluid from your vagina.  You have spotting or bleeding from your vagina.  You have severe abdominal cramping or pain.  You have rapid weight loss or gain.  You have shortness of breath with chest pain.  You notice sudden or extreme swelling of your face, hands, ankles, feet, or legs.  You have not felt your baby move in over an hour.  You have severe headaches that do not go away with medicine.  You have vision changes.   This information is not intended to replace advice given to you by your health care provider. Make sure you discuss any questions you have with your health care provider.   Document Released:  06/12/2001 Document Revised: 07/09/2014 Document Reviewed: 08/19/2012 Elsevier Interactive Patient Education 2016 [employer]  Breastfeeding Deciding to breastfeed is one of the best choices you can make for you and your baby. A change in hormones during pregnancy causes your breast tissue to grow and increases the number and size of your milk ducts. These hormones also allow proteins, sugars, and fats from your blood supply to make breast milk in your milk-producing glands. Hormones prevent breast milk from being released before your baby is born as well as prompt milk flow after birth. Once breastfeeding has begun, thoughts of your baby, as well as his or her sucking or crying, can stimulate the release of milk from your milk-producing glands.  BENEFITS OF BREASTFEEDING For Your Baby  Your first milk (colostrum) helps your baby's digestive system function better.  There are antibodies in your milk that help your baby fight off infections.  Your baby has a lower incidence of asthma, allergies, and sudden infant death syndrome.  The nutrients in breast milk are better for your baby than infant formulas and are designed uniquely for your baby's needs.  Breast milk improves your baby's brain development.  Your baby is less likely to develop other conditions, such as childhood obesity, asthma, or type 2 diabetes mellitus. For You  Breastfeeding helps to create a very special bond between you and your baby.  Breastfeeding is convenient. Breast milk is always available at the correct temperature and costs nothing.  Breastfeeding helps to burn calories and helps you lose the weight gained during pregnancy.  Breastfeeding makes your uterus contract to its prepregnancy size faster and slows bleeding (lochia) after you give birth.   Breastfeeding helps to lower your risk of developing type 2 diabetes mellitus, osteoporosis, and breast or ovarian cancer later in life. SIGNS THAT YOUR BABY IS  HUNGRY Early Signs of Hunger  Increased alertness or activity.  Stretching.  Movement of the head from side to side.  Movement of the head and opening of the mouth when the corner of the mouth or cheek is stroked (rooting).  Increased sucking sounds, smacking lips, cooing, sighing, or squeaking.  Hand-to-mouth movements.  Increased sucking of fingers or hands. Late Signs of Hunger  Fussing.  Intermittent crying. Extreme Signs of Hunger Signs of extreme hunger will require calming and consoling before your baby will be able to breastfeed successfully. Do not wait for the following signs of extreme hunger to occur before you initiate breastfeeding:  Restlessness.  A loud, strong cry.  Screaming.   BREASTFEEDING BASICS Breastfeeding Initiation  Find a comfortable place to sit or lie down, with your neck and back well supported.  Place a pillow or rolled up blanket under your baby to bring him or her to the level of your breast (if you are seated). Nursing pillows are specially designed to help support your arms and your baby while you breastfeed.  Make sure that your baby's abdomen is facing your abdomen.  Gently massage your breast. With your fingertips, massage from your chest wall toward your nipple in a circular motion. This encourages milk flow. You may need to continue this action during the feeding if your milk flows slowly.  Support your breast with 4 fingers underneath and your thumb above your nipple. Make sure your fingers are well away from your nipple and your baby's mouth.  Stroke your baby's lips gently with your finger or nipple.  When your baby's mouth is open wide enough, quickly bring your baby to your breast, placing your entire nipple and as much of the colored area around your nipple (areola) as possible into your baby's mouth.  More areola should be visible above your baby's upper lip than below the lower lip.  Your baby's tongue should be between his  or her lower gum and your breast.  Ensure that your baby's mouth is correctly positioned around your nipple (latched). Your baby's lips should create a seal on your breast and be turned out (everted).  It is common for your baby to suck about 2-3 minutes in order to start the flow of breast milk. Latching Teaching your baby how to latch on to your breast properly is very important. An improper latch can cause nipple pain and decreased milk supply for you and poor weight gain in your baby. Also, if your baby is not latched onto your nipple properly, he or she may swallow some air during feeding. This can make your baby fussy. Burping your baby when you switch breasts during the feeding can help to get rid of the air. However, teaching your baby to latch on properly is still the best way to prevent fussiness from swallowing air while breastfeeding. Signs that your baby has successfully latched on to your nipple:  Silent tugging or silent sucking, without causing you pain.  Swallowing heard between every 3-4 sucks.  Muscle movement above and in front of his or her ears while sucking. Signs that your baby has not successfully latched on to nipple:  Sucking sounds or smacking sounds from your baby while breastfeeding.  Nipple pain. If you think your baby has not latched on correctly, slip your finger into the corner of your baby's mouth to break the suction and place it between your baby's gums. Attempt breastfeeding initiation again. Signs of Successful Breastfeeding Signs from your baby:  A gradual decrease in the number of sucks or complete cessation of sucking.  Falling asleep.  Relaxation of his or her body.  Retention of a small amount of milk in his or her mouth.  Letting go of your breast by himself or herself. Signs from you:  Breasts that have increased in firmness, weight, and size 1-3 hours after feeding.  Breasts that are softer immediately after  breastfeeding.  Increased milk volume, as well as a change in milk consistency and color by the fifth day of breastfeeding.  Nipples that are not sore, cracked, or bleeding. Signs That Your Baby is Getting Enough Milk  Wetting at least 3 diapers in a 24-hour period.   The urine should be clear and pale yellow by age 5 days.  At least 3 stools in a 24-hour period by age 5 days. The stool should be soft and yellow.  At least 3 stools in a 24-hour period by age 7 days. The stool should be seedy and yellow.  No loss of weight greater than 10% of birth weight during the first 3 days of age.  Average weight gain of 4-7 ounces (113-198 g) per week after age 4 days.  Consistent daily weight gain by age 5 days, without weight loss after the age of 2 weeks. After a feeding, your baby may spit up a small amount. This is common. BREASTFEEDING FREQUENCY AND DURATION Frequent feeding will help you make more milk and can prevent sore nipples and breast engorgement. Breastfeed when you feel the need to reduce the fullness of your breasts or when your baby shows signs of hunger. This is called "breastfeeding on demand." Avoid introducing a pacifier to your baby while you are working to establish breastfeeding (the first 4-6 weeks after your baby is born). After this time you may choose to use a pacifier. Research has shown that pacifier use during the first year of a baby's life decreases the risk of sudden infant death syndrome (SIDS). Allow your baby to feed on each breast as long as he or she wants. Breastfeed until your baby is finished feeding. When your baby unlatches or falls asleep while feeding from the first breast, offer the second breast. Because newborns are often sleepy in the first few weeks of life, you may need to awaken your baby to get him or her to feed. Breastfeeding times will vary from baby to baby. However, the following rules can serve as a guide to help you ensure that your baby is  properly fed:  Newborns (babies 4 weeks of age or younger) may breastfeed every 1-3 hours.  Newborns should not go longer than 3 hours during the day or 5 hours during the night without breastfeeding.  You should breastfeed your baby a minimum of 8 times in a 24-hour period until you begin to introduce solid foods to your baby at around 6 months of age. BREAST MILK PUMPING Pumping and storing breast milk allows you to ensure that your baby is exclusively fed your breast milk, even at times when you are unable to breastfeed. This is especially important if you are going back to work while you are still breastfeeding or when you are not able to be present during feedings. Your lactation consultant can give you guidelines on how long it is safe to store breast milk. A breast pump is a machine that allows you to pump milk from your breast into a sterile bottle. The pumped breast milk can then be stored in a refrigerator or freezer. Some breast pumps are operated by hand, while others use electricity. Ask your lactation consultant which type will work best for you. Breast pumps can be purchased, but some hospitals and breastfeeding support groups lease breast pumps on a monthly basis. A lactation consultant can teach you how to hand express breast milk, if you prefer not to use a pump. CARING FOR YOUR BREASTS WHILE YOU BREASTFEED Nipples can become dry, cracked, and sore while breastfeeding. The following recommendations can help keep your breasts moisturized and healthy:  Avoid using soap on your nipples.  Wear a supportive bra. Although not required, special nursing bras and tank tops are designed to allow access to your   breasts for breastfeeding without taking off your entire bra or top. Avoid wearing underwire-style bras or extremely tight bras.  Air dry your nipples for 3-4minutes after each feeding.  Use only cotton bra pads to absorb leaked breast milk. Leaking of breast milk between feedings  is normal.  Use lanolin on your nipples after breastfeeding. Lanolin helps to maintain your skin's normal moisture barrier. If you use pure lanolin, you do not need to wash it off before feeding your baby again. Pure lanolin is not toxic to your baby. You may also hand express a few drops of breast milk and gently massage that milk into your nipples and allow the milk to air dry. In the first few weeks after giving birth, some women experience extremely full breasts (engorgement). Engorgement can make your breasts feel heavy, warm, and tender to the touch. Engorgement peaks within 3-5 days after you give birth. The following recommendations can help ease engorgement:  Completely empty your breasts while breastfeeding or pumping. You may want to start by applying warm, moist heat (in the shower or with warm water-soaked hand towels) just before feeding or pumping. This increases circulation and helps the milk flow. If your baby does not completely empty your breasts while breastfeeding, pump any extra milk after he or she is finished.  Wear a snug bra (nursing or regular) or tank top for 1-2 days to signal your body to slightly decrease milk production.  Apply ice packs to your breasts, unless this is too uncomfortable for you.  Make sure that your baby is latched on and positioned properly while breastfeeding. If engorgement persists after 48 hours of following these recommendations, contact your health care provider or a lactation consultant. OVERALL HEALTH CARE RECOMMENDATIONS WHILE BREASTFEEDING  Eat healthy foods. Alternate between meals and snacks, eating 3 of each per day. Because what you eat affects your breast milk, some of the foods may make your baby more irritable than usual. Avoid eating these foods if you are sure that they are negatively affecting your baby.  Drink milk, fruit juice, and water to satisfy your thirst (about 10 glasses a day).  Rest often, relax, and continue to take  your prenatal vitamins to prevent fatigue, stress, and anemia.  Continue breast self-awareness checks.  Avoid chewing and smoking tobacco. Chemicals from cigarettes that pass into breast milk and exposure to secondhand smoke may harm your baby.  Avoid alcohol and drug use, including marijuana. Some medicines that may be harmful to your baby can pass through breast milk. It is important to ask your health care provider before taking any medicine, including all over-the-counter and prescription medicine as well as vitamin and herbal supplements. It is possible to become pregnant while breastfeeding. If birth control is desired, ask your health care provider about options that will be safe for your baby. SEEK MEDICAL CARE IF:  You feel like you want to stop breastfeeding or have become frustrated with breastfeeding.  You have painful breasts or nipples.  Your nipples are cracked or bleeding.  Your breasts are red, tender, or warm.  You have a swollen area on either breast.  You have a fever or chills.  You have nausea or vomiting.  You have drainage other than breast milk from your nipples.  Your breasts do not become full before feedings by the fifth day after you give birth.  You feel sad and depressed.  Your baby is too sleepy to eat well.  Your baby is having trouble sleeping.     Your baby is wetting less than 3 diapers in a 24-hour period.  Your baby has less than 3 stools in a 24-hour period.  Your baby's skin or the white part of his or her eyes becomes yellow.   Your baby is not gaining weight by 5 days of age. SEEK IMMEDIATE MEDICAL CARE IF:  Your baby is overly tired (lethargic) and does not want to wake up and feed.  Your baby develops an unexplained fever.   This information is not intended to replace advice given to you by your health care provider. Make sure you discuss any questions you have with your health care provider.   Document Released: 06/18/2005  Document Revised: 03/09/2015 Document Reviewed: 12/10/2012 Elsevier Interactive Patient Education 2016 [employer]  

## 2016-03-07 ENCOUNTER — Telehealth: Payer: Self-pay | Admitting: *Deleted

## 2016-03-07 LAB — HIV ANTIBODY (ROUTINE TESTING W REFLEX): HIV 1&2 Ab, 4th Generation: NONREACTIVE

## 2016-03-07 LAB — GLUCOSE TOLERANCE, 1 HOUR (50G) W/O FASTING: GLUCOSE, 1 HR, GESTATIONAL: 163 mg/dL — AB (ref <140)

## 2016-03-07 LAB — RPR

## 2016-03-07 NOTE — Telephone Encounter (Signed)
Informed pt of abn 1 hr GTT and the need for 3 hr GTT, scheduled appt 03-12-16 at 0815.

## 2016-03-07 NOTE — Telephone Encounter (Signed)
-----   Message from Donnamae Jude, MD sent at 03/07/2016  8:23 AM EDT ----- abnl 1 hour--needs 3 hour

## 2016-03-12 ENCOUNTER — Other Ambulatory Visit (INDEPENDENT_AMBULATORY_CARE_PROVIDER_SITE_OTHER): Payer: 59 | Admitting: *Deleted

## 2016-03-12 DIAGNOSIS — R7302 Impaired glucose tolerance (oral): Secondary | ICD-10-CM

## 2016-03-12 DIAGNOSIS — R7309 Other abnormal glucose: Secondary | ICD-10-CM

## 2016-03-12 DIAGNOSIS — R319 Hematuria, unspecified: Secondary | ICD-10-CM | POA: Diagnosis not present

## 2016-03-12 LAB — POCT URINALYSIS DIPSTICK
Bilirubin, UA: NEGATIVE
Glucose, UA: NEGATIVE
KETONES UA: NEGATIVE
NITRITE UA: NEGATIVE
PH UA: 6
PROTEIN UA: NEGATIVE
Spec Grav, UA: 1.02
Urobilinogen, UA: 0.2

## 2016-03-12 NOTE — Progress Notes (Unsigned)
Pt had an abnormal 1 HR GTT, here today for 3 HR GTT.  Pt also states that she experienced some back pain and had an episode of slight spotting noted when she wiped yesterday, declined any bleeding in her underwear just noted it occasionally with wiping but has resolved as of today.  UA + leukocytes and trace blood, will send urine cx to r/o UTI. Informed pt if bleeding starts again or she starts to have any sharp sudden abdominal pain to come in for evaluation.

## 2016-03-13 ENCOUNTER — Other Ambulatory Visit (HOSPITAL_COMMUNITY): Payer: Self-pay | Admitting: Maternal and Fetal Medicine

## 2016-03-13 ENCOUNTER — Encounter (HOSPITAL_COMMUNITY): Payer: Self-pay

## 2016-03-13 ENCOUNTER — Ambulatory Visit (HOSPITAL_COMMUNITY)
Admission: RE | Admit: 2016-03-13 | Discharge: 2016-03-13 | Disposition: A | Discharge status: Home / Self Care | Payer: 59 | Source: Ambulatory Visit | Attending: Family Medicine | Admitting: Family Medicine

## 2016-03-13 VITALS — BP 119/70 | HR 81 | Wt 199.8 lb

## 2016-03-13 DIAGNOSIS — O09299 Supervision of pregnancy with other poor reproductive or obstetric history, unspecified trimester: Secondary | ICD-10-CM

## 2016-03-13 DIAGNOSIS — O99333 Smoking (tobacco) complicating pregnancy, third trimester: Secondary | ICD-10-CM | POA: Insufficient documentation

## 2016-03-13 DIAGNOSIS — O09523 Supervision of elderly multigravida, third trimester: Secondary | ICD-10-CM

## 2016-03-13 DIAGNOSIS — O99213 Obesity complicating pregnancy, third trimester: Secondary | ICD-10-CM | POA: Diagnosis not present

## 2016-03-13 DIAGNOSIS — O10013 Pre-existing essential hypertension complicating pregnancy, third trimester: Secondary | ICD-10-CM | POA: Insufficient documentation

## 2016-03-13 DIAGNOSIS — O99332 Smoking (tobacco) complicating pregnancy, second trimester: Secondary | ICD-10-CM

## 2016-03-13 DIAGNOSIS — O09293 Supervision of pregnancy with other poor reproductive or obstetric history, third trimester: Secondary | ICD-10-CM | POA: Diagnosis not present

## 2016-03-13 DIAGNOSIS — O10019 Pre-existing essential hypertension complicating pregnancy, unspecified trimester: Secondary | ICD-10-CM

## 2016-03-13 DIAGNOSIS — Z3A30 30 weeks gestation of pregnancy: Secondary | ICD-10-CM | POA: Diagnosis not present

## 2016-03-13 DIAGNOSIS — E669 Obesity, unspecified: Secondary | ICD-10-CM | POA: Diagnosis not present

## 2016-03-13 DIAGNOSIS — Z8632 Personal history of gestational diabetes: Secondary | ICD-10-CM

## 2016-03-13 LAB — GLUCOSE TOLERANCE, 3 HOURS
GLUCOSE 3 HOUR GTT: 79 mg/dL (ref <145)
Glucose Tolerance, 1 hour: 182 mg/dL (ref <190)
Glucose Tolerance, 2 hour: 147 mg/dL (ref <165)
Glucose Tolerance, Fasting: 68 mg/dL (ref 65–104)

## 2016-03-13 IMAGING — US US MFM OB FOLLOW-UP
1 series · 14 of 28 positions shown · non-contrast
Comparison: none

[Series 1: us mfm ob follow-up · 14 of 49 slices shown]
[im 2/49]
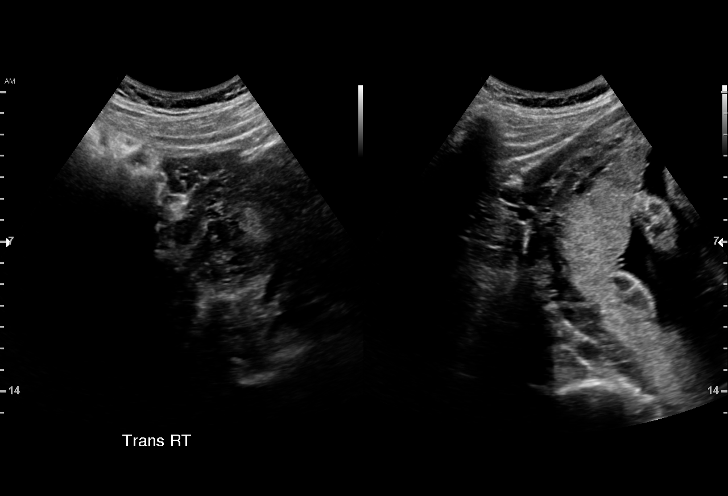
[im 6/49]
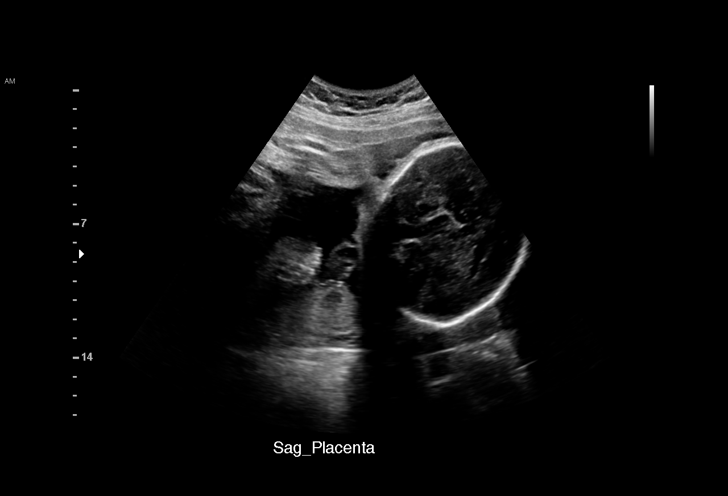
[im 9/49]
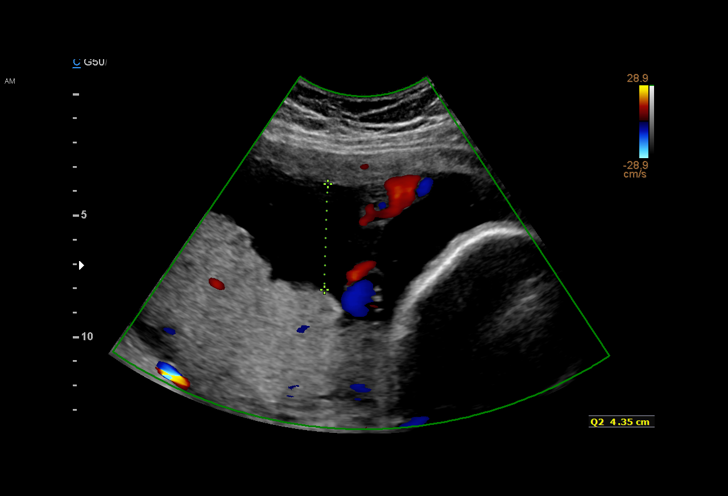
[im 13/49]
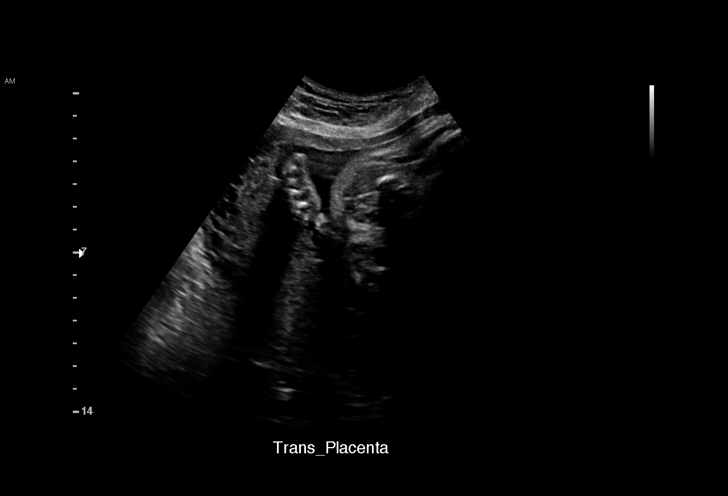
[im 17/49]
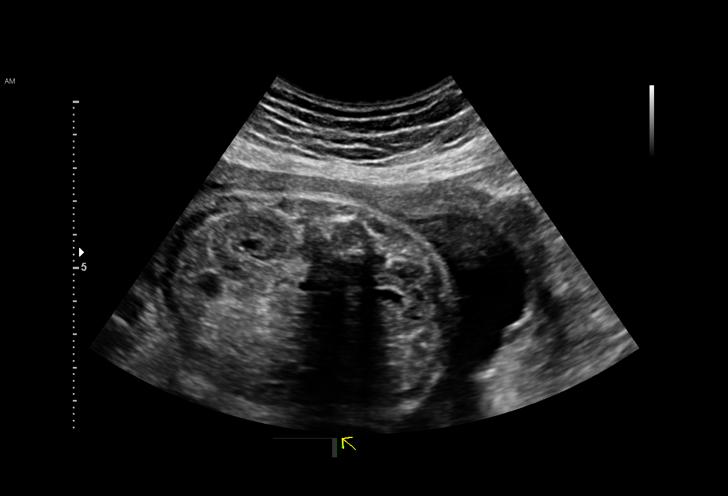
[im 20/49]
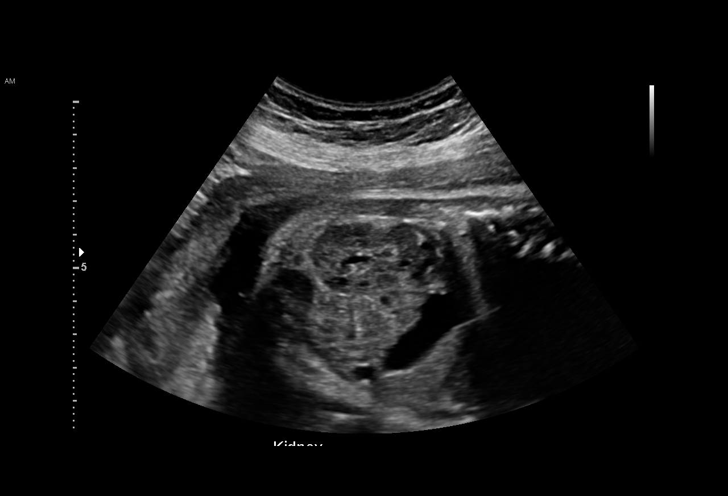
[im 24/49]
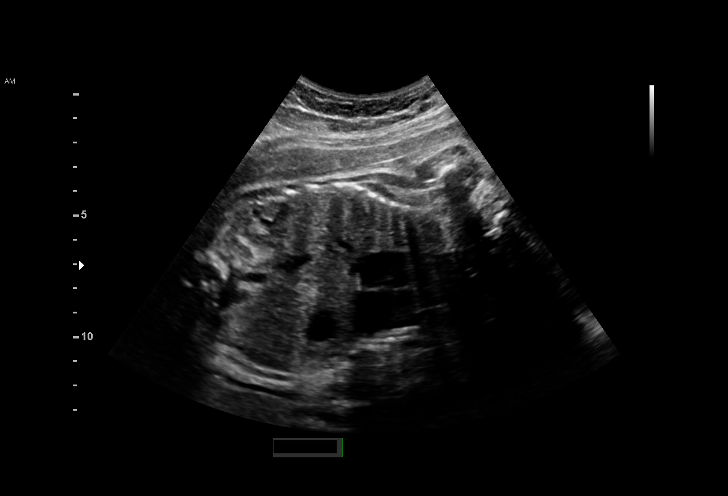
[im 27/49]
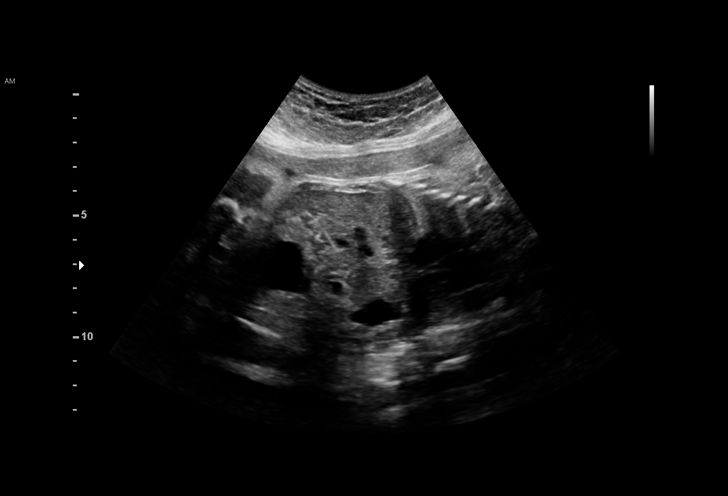
[im 31/49]
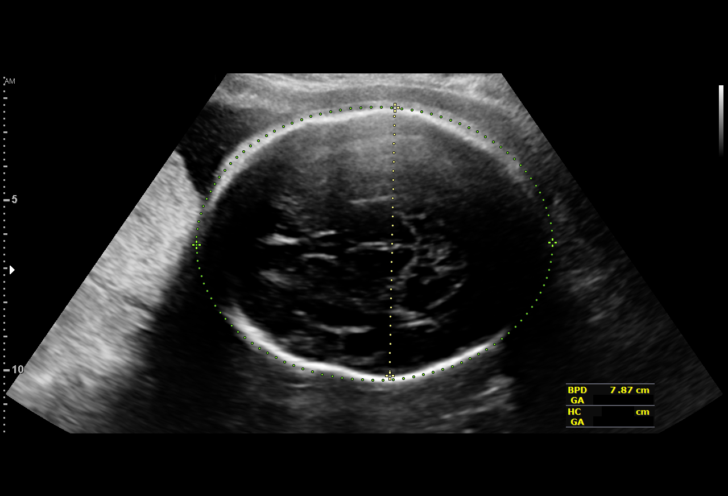
[im 34/49]
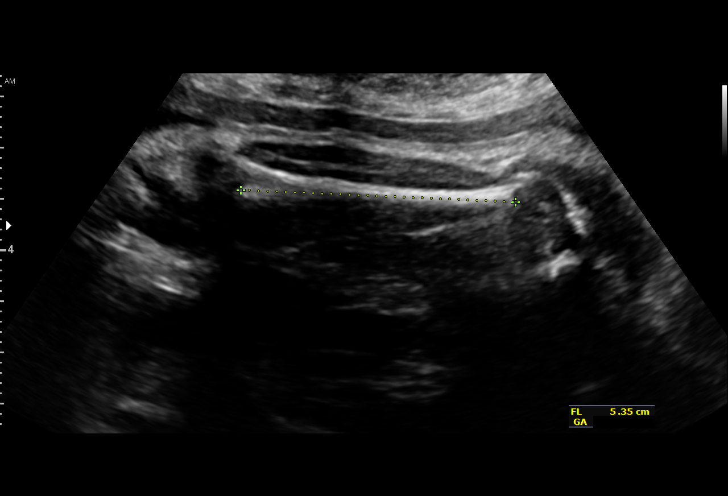
[im 38/49]
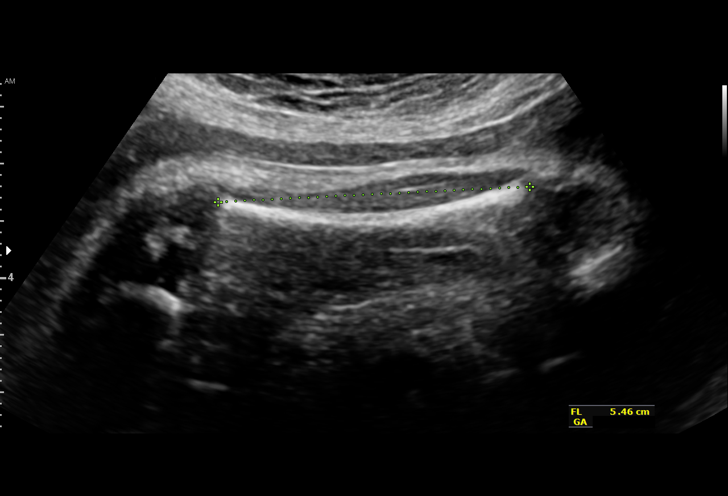
[im 41/49]
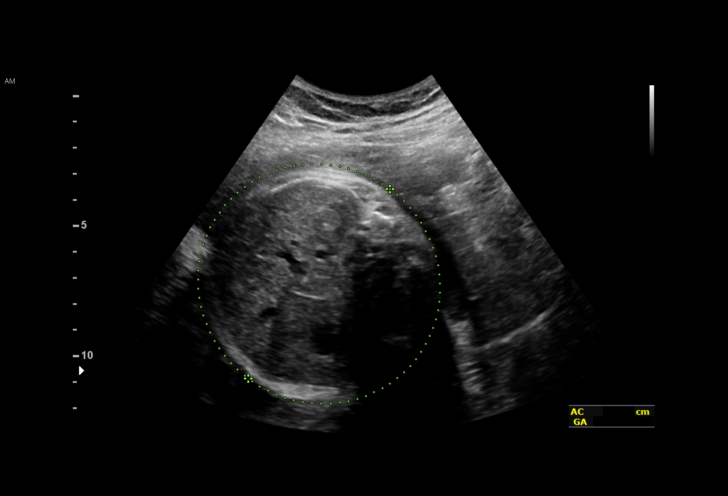
[im 45/49]
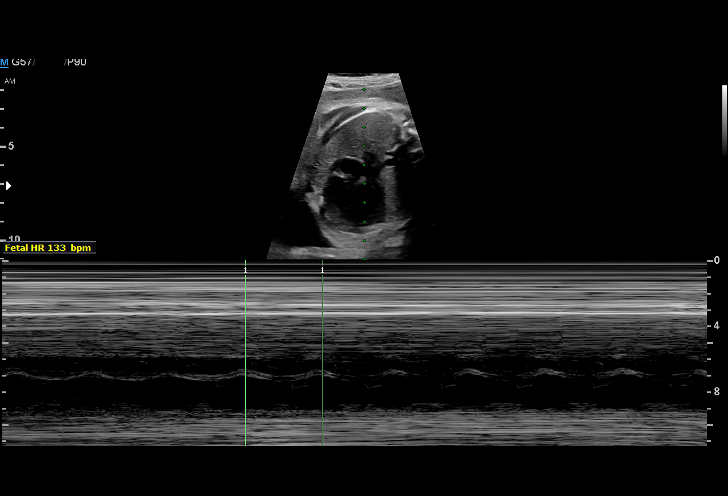
[im 49/49]
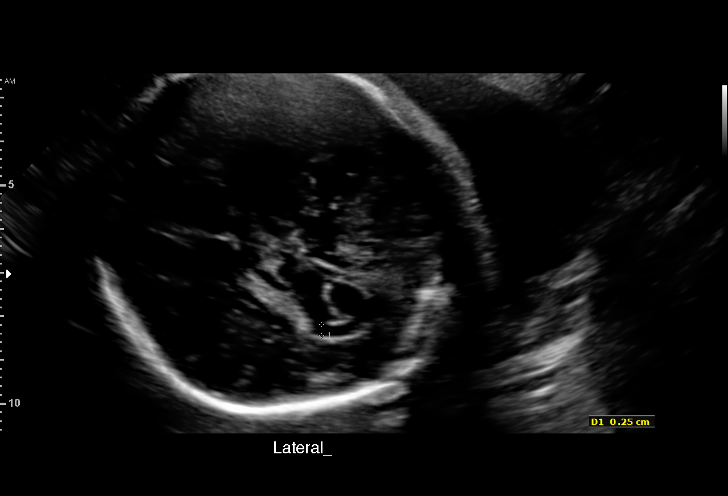

[14 of 28 positions shown; findings below may reference images not displayed]

[REDACTED]

1  SAIRA            [PHONE_NUMBER]      [PHONE_NUMBER]     [PHONE_NUMBER]
Indications

30 weeks gestation of pregnancy
Hypertension - Chronic/Pre-existing            [XR]
Advanced maternal age multigravida 35+,        [XR]
third trimester
Obesity complicating pregnancy, third          [XR]
trimester
Poor obstetric history: Previous gestational   [XR]
diabetes
Smoking complicating pregnancy, third          [XR]
trimester
OB History

Blood Type:            Height:  5'4"   Weight (lb):  194       BMI:
Gravidity:    7         Term:   3        Prem:   0        SAB:   1
TOP:          2       Ectopic:  0        Living: 3
Fetal Evaluation

Num Of Fetuses:     1
Fetal Heart         114
Rate(bpm):
Cardiac Activity:   Observed
Presentation:       Cephalic
Placenta:           Posterior, above cervical os
P. Cord Insertion:  Previously Visualized
Amniotic Fluid
AFI FV:      Subjectively within normal limits

AFI Sum(cm)     %Tile       Largest Pocket(cm)
18.52           70

RUQ(cm)       RLQ(cm)       LUQ(cm)        LLQ(cm)
3.45
Biometry

BPD:      78.4  mm     G. Age:  31w 3d         71  %    CI:        73.31   %    70 - 86
FL/HC:      18.8   %    19.2 -
HC:       291   mm     G. Age:  32w 1d         61  %    HC/AC:      1.00        0.99 -
AC:      291.5  mm     G. Age:  33w 1d       > 97  %    FL/BPD:     69.8   %    71 - 87
FL:       54.7  mm     G. Age:  28w 6d          7  %    FL/AC:      18.8   %    20 - 24

Est. FW:    [XR]  gm           4 lb     76  %
Gestational Age

LMP:           32w 3d        Date:  [DATE]                 EDD:   [DATE]
U/S Today:     31w 3d                                        EDD:   [DATE]
Best:          30w 3d     Det. By:  Early Ultrasound         EDD:   [DATE]
([DATE])
Cervix Uterus Adnexa

Cervix
Not visualized (advanced GA >[XR])

Uterus
No abnormality visualized.

Left Ovary
Not visualized.

Right Ovary
Not visualized.

Adnexa:       No abnormality visualized.
Impression

Single IUP at 30w 3d
CHTN on Atenolol, Advanced maternal age
Fetal growth is appropriate (76th %tile)
Posterior placenta without previa
Normal amniotic fluid volume
Recommendations

Recommend follow-up ultrasound examination in 4 weeks for
interval growth
Antenatal testing beginning at 32 weeks

## 2016-03-15 ENCOUNTER — Telehealth: Payer: Self-pay | Admitting: *Deleted

## 2016-03-15 DIAGNOSIS — N390 Urinary tract infection, site not specified: Secondary | ICD-10-CM

## 2016-03-15 LAB — CULTURE, OB URINE

## 2016-03-15 MED ORDER — NITROFURANTOIN MONOHYD MACRO 100 MG PO CAPS: 100.0000 mg | ORAL_CAPSULE | Freq: Two times a day (BID) | ORAL | 0 refills | Status: DC

## 2016-03-15 NOTE — Telephone Encounter (Signed)
Pt called requesting results on Urine Cx, reviewed urine cx and + Enterococcus noted.  Sent Macrobid to pharmacy per Dr Ilda Basset order and instructed pt on medication use.

## 2016-03-20 ENCOUNTER — Ambulatory Visit (INDEPENDENT_AMBULATORY_CARE_PROVIDER_SITE_OTHER): Payer: 59 | Admitting: Family Medicine

## 2016-03-20 VITALS — BP 116/78 | HR 85 | Wt 198.0 lb

## 2016-03-20 DIAGNOSIS — Z23 Encounter for immunization: Secondary | ICD-10-CM | POA: Diagnosis not present

## 2016-03-20 DIAGNOSIS — O0993 Supervision of high risk pregnancy, unspecified, third trimester: Secondary | ICD-10-CM

## 2016-03-20 DIAGNOSIS — O10013 Pre-existing essential hypertension complicating pregnancy, third trimester: Secondary | ICD-10-CM

## 2016-03-20 NOTE — Progress Notes (Signed)
   PRENATAL VISIT NOTE  Subjective:  [name] is a 35 y.o. [id number] at [redacted]w[redacted]d being seen today for ongoing prenatal care.  She is currently monitored for the following issues for this high-risk pregnancy and has HTN (hypertension), benign; Hypertriglyceridemia; Supervision of high risk pregnancy, antepartum; Hypertension in pregnancy, essential, antepartum; AMA (advanced maternal age) multigravida 57+; Desires sterilization; Obesity in pregnancy; and Carpal tunnel syndrome during pregnancy on her problem list.  Patient reports no complaints.  Contractions: Not present. Vag. Bleeding: None.  Movement: Present. Denies leaking of fluid.   The following portions of the patient's history were reviewed and updated as appropriate: allergies, current medications, past family history, past medical history, past social history, past surgical history and problem list. Problem list updated.  Objective:   Vitals:   03/20/16 1003  BP: 116/78  Pulse: 85  Weight: 198 lb (89.8 kg)    Fetal Status: Fetal Heart Rate (bpm): 128 Fundal Height: 31 cm Movement: Present     General:  Alert, oriented and cooperative. Patient is in no acute distress.  Skin: Skin is warm and dry. No rash noted.   Cardiovascular: Normal heart rate noted  Respiratory: Normal respiratory effort, no problems with respiration noted  Abdomen: Soft, gravid, appropriate for gestational age. Pain/Pressure: Present     Pelvic:  Cervical exam deferred        Extremities: Normal range of motion.  Edema: None  Mental Status: Normal mood and affect. Normal behavior. Normal judgment and thought content.   Urinalysis: Urine Protein: Negative Urine Glucose: Negative  Assessment and Plan:  Pregnancy: [id number] at [redacted]w[redacted]d  1. Hypertension in pregnancy, essential, antepartum, third trimester Continue ASA, atenolol  2. Supervision of high risk pregnancy, antepartum, third trimester Continue prenatal care.  - Flu Vaccine QUAD 36+ mos IM  (Fluarix, Quad PF)  Preterm labor symptoms and general obstetric precautions including but not limited to vaginal bleeding, contractions, leaking of fluid and fetal movement were reviewed in detail with the patient. Please refer to After Visit Summary for other counseling recommendations.  Return in 2 weeks (on 04/03/2016).  Donnamae Jude, MD

## 2016-03-20 NOTE — Patient Instructions (Signed)
Third Trimester of Pregnancy The third trimester is from week 29 through week 42, months 7 through 9. The third trimester is a time when the fetus is growing rapidly. At the end of the ninth month, the fetus is about 20 inches in length and weighs 6-10 pounds.  BODY CHANGES Your body goes through many changes during pregnancy. The changes vary from woman to woman.   Your weight will continue to increase. You can expect to gain 25-35 pounds (11-16 kg) by the end of the pregnancy.  You may begin to get stretch marks on your hips, abdomen, and breasts.  You may urinate more often because the fetus is moving lower into your pelvis and pressing on your bladder.  You may develop or continue to have heartburn as a result of your pregnancy.  You may develop constipation because certain hormones are causing the muscles that push waste through your intestines to slow down.  You may develop hemorrhoids or swollen, bulging veins (varicose veins).  You may have pelvic pain because of the weight gain and pregnancy hormones relaxing your joints between the bones in your pelvis. Backaches may result from overexertion of the muscles supporting your posture.  You may have changes in your hair. These can include thickening of your hair, rapid growth, and changes in texture. Some women also have hair loss during or after pregnancy, or hair that feels dry or thin. Your hair will most likely return to normal after your baby is born.  Your breasts will continue to grow and be tender. A yellow discharge may leak from your breasts called colostrum.  Your belly button may stick out.  You may feel short of breath because of your expanding uterus.  You may notice the fetus "dropping," or moving lower in your abdomen.  You may have a bloody mucus discharge. This usually occurs a few days to a week before labor begins.  Your cervix becomes thin and soft (effaced) near your due date. WHAT TO EXPECT AT YOUR  PRENATAL EXAMS  You will have prenatal exams every 2 weeks until week 36. Then, you will have weekly prenatal exams. During a routine prenatal visit:  You will be weighed to make sure you and the fetus are growing normally.  Your blood pressure is taken.  Your abdomen will be measured to track your baby's growth.  The fetal heartbeat will be listened to.  Any test results from the previous visit will be discussed.  You may have a cervical check near your due date to see if you have effaced. At around 36 weeks, your caregiver will check your cervix. At the same time, your caregiver will also perform a test on the secretions of the vaginal tissue. This test is to determine if a type of bacteria, Group B streptococcus, is present. Your caregiver will explain this further. Your caregiver may ask you:  What your birth plan is.  How you are feeling.  If you are feeling the baby move.  If you have had any abnormal symptoms, such as leaking fluid, bleeding, severe headaches, or abdominal cramping.  If you are using any tobacco products, including cigarettes, chewing tobacco, and electronic cigarettes.  If you have any questions. Other tests or screenings that may be performed during your third trimester include:  Blood tests that check for low iron levels (anemia).  Fetal testing to check the health, activity level, and growth of the fetus. Testing is done if you have certain medical conditions or if   there are problems during the pregnancy.  HIV (human immunodeficiency virus) testing. If you are at high risk, you may be screened for HIV during your third trimester of pregnancy. FALSE LABOR You may feel small, irregular contractions that eventually go away. These are called Braxton Hicks contractions, or false labor. Contractions may last for hours, days, or even weeks before true labor sets in. If contractions come at regular intervals, intensify, or become painful, it is best to be seen  by your caregiver.  SIGNS OF LABOR   Menstrual-like cramps.  Contractions that are 5 minutes apart or less.  Contractions that start on the top of the uterus and spread down to the lower abdomen and back.  A sense of increased pelvic pressure or back pain.  A watery or bloody mucus discharge that comes from the vagina. If you have any of these signs before the 37th week of pregnancy, call your caregiver right away. You need to go to the hospital to get checked immediately. HOME CARE INSTRUCTIONS   Avoid all smoking, herbs, alcohol, and unprescribed drugs. These chemicals affect the formation and growth of the baby.  Do not use any tobacco products, including cigarettes, chewing tobacco, and electronic cigarettes. If you need help quitting, ask your health care provider. You may receive counseling support and other resources to help you quit.  Follow your caregiver's instructions regarding medicine use. There are medicines that are either safe or unsafe to take during pregnancy.  Exercise only as directed by your caregiver. Experiencing uterine cramps is a good sign to stop exercising.  Continue to eat regular, healthy meals.  Wear a good support bra for breast tenderness.  Do not use hot tubs, steam rooms, or saunas.  Wear your seat belt at all times when driving.  Avoid raw meat, uncooked cheese, cat litter boxes, and soil used by cats. These carry germs that can cause birth defects in the baby.  Take your prenatal vitamins.  Take 1500-2000 mg of calcium daily starting at the 20th week of pregnancy until you deliver your baby.  Try taking a stool softener (if your caregiver approves) if you develop constipation. Eat more high-fiber foods, such as fresh vegetables or fruit and whole grains. Drink plenty of fluids to keep your urine clear or pale yellow.  Take warm sitz baths to soothe any pain or discomfort caused by hemorrhoids. Use hemorrhoid cream if your caregiver  approves.  If you develop varicose veins, wear support hose. Elevate your feet for 15 minutes, 3-4 times a day. Limit salt in your diet.  Avoid heavy lifting, wear low heal shoes, and practice good posture.  Rest a lot with your legs elevated if you have leg cramps or low back pain.  Visit your dentist if you have not gone during your pregnancy. Use a soft toothbrush to brush your teeth and be gentle when you floss.  A sexual relationship may be continued unless your caregiver directs you otherwise.  Do not travel far distances unless it is absolutely necessary and only with the approval of your caregiver.  Take prenatal classes to understand, practice, and ask questions about the labor and delivery.  Make a trial run to the hospital.  Pack your hospital bag.  Prepare the baby's nursery.  Continue to go to all your prenatal visits as directed by your caregiver. SEEK MEDICAL CARE IF:  You are unsure if you are in labor or if your water has broken.  You have dizziness.  You have   mild pelvic cramps, pelvic pressure, or nagging pain in your abdominal area.  You have persistent nausea, vomiting, or diarrhea.  You have a bad smelling vaginal discharge.  You have pain with urination. SEEK IMMEDIATE MEDICAL CARE IF:   You have a fever.  You are leaking fluid from your vagina.  You have spotting or bleeding from your vagina.  You have severe abdominal cramping or pain.  You have rapid weight loss or gain.  You have shortness of breath with chest pain.  You notice sudden or extreme swelling of your face, hands, ankles, feet, or legs.  You have not felt your baby move in over an hour.  You have severe headaches that do not go away with medicine.  You have vision changes.   This information is not intended to replace advice given to you by your health care provider. Make sure you discuss any questions you have with your health care provider.   Document Released:  06/12/2001 Document Revised: 07/09/2014 Document Reviewed: 08/19/2012 Elsevier Interactive Patient Education 2016 [employer]  Breastfeeding Deciding to breastfeed is one of the best choices you can make for you and your baby. A change in hormones during pregnancy causes your breast tissue to grow and increases the number and size of your milk ducts. These hormones also allow proteins, sugars, and fats from your blood supply to make breast milk in your milk-producing glands. Hormones prevent breast milk from being released before your baby is born as well as prompt milk flow after birth. Once breastfeeding has begun, thoughts of your baby, as well as his or her sucking or crying, can stimulate the release of milk from your milk-producing glands.  BENEFITS OF BREASTFEEDING For Your Baby  Your first milk (colostrum) helps your baby's digestive system function better.  There are antibodies in your milk that help your baby fight off infections.  Your baby has a lower incidence of asthma, allergies, and sudden infant death syndrome.  The nutrients in breast milk are better for your baby than infant formulas and are designed uniquely for your baby's needs.  Breast milk improves your baby's brain development.  Your baby is less likely to develop other conditions, such as childhood obesity, asthma, or type 2 diabetes mellitus. For You  Breastfeeding helps to create a very special bond between you and your baby.  Breastfeeding is convenient. Breast milk is always available at the correct temperature and costs nothing.  Breastfeeding helps to burn calories and helps you lose the weight gained during pregnancy.  Breastfeeding makes your uterus contract to its prepregnancy size faster and slows bleeding (lochia) after you give birth.   Breastfeeding helps to lower your risk of developing type 2 diabetes mellitus, osteoporosis, and breast or ovarian cancer later in life. SIGNS THAT YOUR BABY IS  HUNGRY Early Signs of Hunger  Increased alertness or activity.  Stretching.  Movement of the head from side to side.  Movement of the head and opening of the mouth when the corner of the mouth or cheek is stroked (rooting).  Increased sucking sounds, smacking lips, cooing, sighing, or squeaking.  Hand-to-mouth movements.  Increased sucking of fingers or hands. Late Signs of Hunger  Fussing.  Intermittent crying. Extreme Signs of Hunger Signs of extreme hunger will require calming and consoling before your baby will be able to breastfeed successfully. Do not wait for the following signs of extreme hunger to occur before you initiate breastfeeding:  Restlessness.  A loud, strong cry.  Screaming.   BREASTFEEDING BASICS Breastfeeding Initiation  Find a comfortable place to sit or lie down, with your neck and back well supported.  Place a pillow or rolled up blanket under your baby to bring him or her to the level of your breast (if you are seated). Nursing pillows are specially designed to help support your arms and your baby while you breastfeed.  Make sure that your baby's abdomen is facing your abdomen.  Gently massage your breast. With your fingertips, massage from your chest wall toward your nipple in a circular motion. This encourages milk flow. You may need to continue this action during the feeding if your milk flows slowly.  Support your breast with 4 fingers underneath and your thumb above your nipple. Make sure your fingers are well away from your nipple and your baby's mouth.  Stroke your baby's lips gently with your finger or nipple.  When your baby's mouth is open wide enough, quickly bring your baby to your breast, placing your entire nipple and as much of the colored area around your nipple (areola) as possible into your baby's mouth.  More areola should be visible above your baby's upper lip than below the lower lip.  Your baby's tongue should be between his  or her lower gum and your breast.  Ensure that your baby's mouth is correctly positioned around your nipple (latched). Your baby's lips should create a seal on your breast and be turned out (everted).  It is common for your baby to suck about 2-3 minutes in order to start the flow of breast milk. Latching Teaching your baby how to latch on to your breast properly is very important. An improper latch can cause nipple pain and decreased milk supply for you and poor weight gain in your baby. Also, if your baby is not latched onto your nipple properly, he or she may swallow some air during feeding. This can make your baby fussy. Burping your baby when you switch breasts during the feeding can help to get rid of the air. However, teaching your baby to latch on properly is still the best way to prevent fussiness from swallowing air while breastfeeding. Signs that your baby has successfully latched on to your nipple:  Silent tugging or silent sucking, without causing you pain.  Swallowing heard between every 3-4 sucks.  Muscle movement above and in front of his or her ears while sucking. Signs that your baby has not successfully latched on to nipple:  Sucking sounds or smacking sounds from your baby while breastfeeding.  Nipple pain. If you think your baby has not latched on correctly, slip your finger into the corner of your baby's mouth to break the suction and place it between your baby's gums. Attempt breastfeeding initiation again. Signs of Successful Breastfeeding Signs from your baby:  A gradual decrease in the number of sucks or complete cessation of sucking.  Falling asleep.  Relaxation of his or her body.  Retention of a small amount of milk in his or her mouth.  Letting go of your breast by himself or herself. Signs from you:  Breasts that have increased in firmness, weight, and size 1-3 hours after feeding.  Breasts that are softer immediately after  breastfeeding.  Increased milk volume, as well as a change in milk consistency and color by the fifth day of breastfeeding.  Nipples that are not sore, cracked, or bleeding. Signs That Your Baby is Getting Enough Milk  Wetting at least 3 diapers in a 24-hour period.   The urine should be clear and pale yellow by age 5 days.  At least 3 stools in a 24-hour period by age 5 days. The stool should be soft and yellow.  At least 3 stools in a 24-hour period by age 7 days. The stool should be seedy and yellow.  No loss of weight greater than 10% of birth weight during the first 3 days of age.  Average weight gain of 4-7 ounces (113-198 g) per week after age 4 days.  Consistent daily weight gain by age 5 days, without weight loss after the age of 2 weeks. After a feeding, your baby may spit up a small amount. This is common. BREASTFEEDING FREQUENCY AND DURATION Frequent feeding will help you make more milk and can prevent sore nipples and breast engorgement. Breastfeed when you feel the need to reduce the fullness of your breasts or when your baby shows signs of hunger. This is called "breastfeeding on demand." Avoid introducing a pacifier to your baby while you are working to establish breastfeeding (the first 4-6 weeks after your baby is born). After this time you may choose to use a pacifier. Research has shown that pacifier use during the first year of a baby's life decreases the risk of sudden infant death syndrome (SIDS). Allow your baby to feed on each breast as long as he or she wants. Breastfeed until your baby is finished feeding. When your baby unlatches or falls asleep while feeding from the first breast, offer the second breast. Because newborns are often sleepy in the first few weeks of life, you may need to awaken your baby to get him or her to feed. Breastfeeding times will vary from baby to baby. However, the following rules can serve as a guide to help you ensure that your baby is  properly fed:  Newborns (babies 4 weeks of age or younger) may breastfeed every 1-3 hours.  Newborns should not go longer than 3 hours during the day or 5 hours during the night without breastfeeding.  You should breastfeed your baby a minimum of 8 times in a 24-hour period until you begin to introduce solid foods to your baby at around 6 months of age. BREAST MILK PUMPING Pumping and storing breast milk allows you to ensure that your baby is exclusively fed your breast milk, even at times when you are unable to breastfeed. This is especially important if you are going back to work while you are still breastfeeding or when you are not able to be present during feedings. Your lactation consultant can give you guidelines on how long it is safe to store breast milk. A breast pump is a machine that allows you to pump milk from your breast into a sterile bottle. The pumped breast milk can then be stored in a refrigerator or freezer. Some breast pumps are operated by hand, while others use electricity. Ask your lactation consultant which type will work best for you. Breast pumps can be purchased, but some hospitals and breastfeeding support groups lease breast pumps on a monthly basis. A lactation consultant can teach you how to hand express breast milk, if you prefer not to use a pump. CARING FOR YOUR BREASTS WHILE YOU BREASTFEED Nipples can become dry, cracked, and sore while breastfeeding. The following recommendations can help keep your breasts moisturized and healthy:  Avoid using soap on your nipples.  Wear a supportive bra. Although not required, special nursing bras and tank tops are designed to allow access to your   breasts for breastfeeding without taking off your entire bra or top. Avoid wearing underwire-style bras or extremely tight bras.  Air dry your nipples for 3-4minutes after each feeding.  Use only cotton bra pads to absorb leaked breast milk. Leaking of breast milk between feedings  is normal.  Use lanolin on your nipples after breastfeeding. Lanolin helps to maintain your skin's normal moisture barrier. If you use pure lanolin, you do not need to wash it off before feeding your baby again. Pure lanolin is not toxic to your baby. You may also hand express a few drops of breast milk and gently massage that milk into your nipples and allow the milk to air dry. In the first few weeks after giving birth, some women experience extremely full breasts (engorgement). Engorgement can make your breasts feel heavy, warm, and tender to the touch. Engorgement peaks within 3-5 days after you give birth. The following recommendations can help ease engorgement:  Completely empty your breasts while breastfeeding or pumping. You may want to start by applying warm, moist heat (in the shower or with warm water-soaked hand towels) just before feeding or pumping. This increases circulation and helps the milk flow. If your baby does not completely empty your breasts while breastfeeding, pump any extra milk after he or she is finished.  Wear a snug bra (nursing or regular) or tank top for 1-2 days to signal your body to slightly decrease milk production.  Apply ice packs to your breasts, unless this is too uncomfortable for you.  Make sure that your baby is latched on and positioned properly while breastfeeding. If engorgement persists after 48 hours of following these recommendations, contact your health care provider or a lactation consultant. OVERALL HEALTH CARE RECOMMENDATIONS WHILE BREASTFEEDING  Eat healthy foods. Alternate between meals and snacks, eating 3 of each per day. Because what you eat affects your breast milk, some of the foods may make your baby more irritable than usual. Avoid eating these foods if you are sure that they are negatively affecting your baby.  Drink milk, fruit juice, and water to satisfy your thirst (about 10 glasses a day).  Rest often, relax, and continue to take  your prenatal vitamins to prevent fatigue, stress, and anemia.  Continue breast self-awareness checks.  Avoid chewing and smoking tobacco. Chemicals from cigarettes that pass into breast milk and exposure to secondhand smoke may harm your baby.  Avoid alcohol and drug use, including marijuana. Some medicines that may be harmful to your baby can pass through breast milk. It is important to ask your health care provider before taking any medicine, including all over-the-counter and prescription medicine as well as vitamin and herbal supplements. It is possible to become pregnant while breastfeeding. If birth control is desired, ask your health care provider about options that will be safe for your baby. SEEK MEDICAL CARE IF:  You feel like you want to stop breastfeeding or have become frustrated with breastfeeding.  You have painful breasts or nipples.  Your nipples are cracked or bleeding.  Your breasts are red, tender, or warm.  You have a swollen area on either breast.  You have a fever or chills.  You have nausea or vomiting.  You have drainage other than breast milk from your nipples.  Your breasts do not become full before feedings by the fifth day after you give birth.  You feel sad and depressed.  Your baby is too sleepy to eat well.  Your baby is having trouble sleeping.     Your baby is wetting less than 3 diapers in a 24-hour period.  Your baby has less than 3 stools in a 24-hour period.  Your baby's skin or the white part of his or her eyes becomes yellow.   Your baby is not gaining weight by 5 days of age. SEEK IMMEDIATE MEDICAL CARE IF:  Your baby is overly tired (lethargic) and does not want to wake up and feed.  Your baby develops an unexplained fever.   This information is not intended to replace advice given to you by your health care provider. Make sure you discuss any questions you have with your health care provider.   Document Released: 06/18/2005  Document Revised: 03/09/2015 Document Reviewed: 12/10/2012 Elsevier Interactive Patient Education 2016 [employer]  

## 2016-03-25 ENCOUNTER — Encounter (HOSPITAL_COMMUNITY): Payer: Self-pay

## 2016-03-25 ENCOUNTER — Inpatient Hospital Stay (HOSPITAL_COMMUNITY)
Admission: AD | Admit: 2016-03-25 | Discharge: 2016-03-25 | Disposition: A | Discharge status: Home / Self Care | Payer: 59 | Source: Ambulatory Visit | Attending: Obstetrics & Gynecology | Admitting: Obstetrics & Gynecology

## 2016-03-25 DIAGNOSIS — Z3A32 32 weeks gestation of pregnancy: Secondary | ICD-10-CM | POA: Diagnosis not present

## 2016-03-25 DIAGNOSIS — R102 Pelvic and perineal pain: Secondary | ICD-10-CM | POA: Diagnosis not present

## 2016-03-25 DIAGNOSIS — O99333 Smoking (tobacco) complicating pregnancy, third trimester: Secondary | ICD-10-CM | POA: Diagnosis not present

## 2016-03-25 DIAGNOSIS — O99213 Obesity complicating pregnancy, third trimester: Secondary | ICD-10-CM | POA: Insufficient documentation

## 2016-03-25 DIAGNOSIS — Z9049 Acquired absence of other specified parts of digestive tract: Secondary | ICD-10-CM | POA: Diagnosis not present

## 2016-03-25 DIAGNOSIS — O99353 Diseases of the nervous system complicating pregnancy, third trimester: Secondary | ICD-10-CM | POA: Diagnosis not present

## 2016-03-25 DIAGNOSIS — O163 Unspecified maternal hypertension, third trimester: Secondary | ICD-10-CM | POA: Insufficient documentation

## 2016-03-25 DIAGNOSIS — E669 Obesity, unspecified: Secondary | ICD-10-CM | POA: Diagnosis not present

## 2016-03-25 DIAGNOSIS — Z87440 Personal history of urinary (tract) infections: Secondary | ICD-10-CM | POA: Insufficient documentation

## 2016-03-25 DIAGNOSIS — I10 Essential (primary) hypertension: Secondary | ICD-10-CM | POA: Diagnosis not present

## 2016-03-25 DIAGNOSIS — O368130 Decreased fetal movements, third trimester, not applicable or unspecified: Secondary | ICD-10-CM | POA: Insufficient documentation

## 2016-03-25 DIAGNOSIS — F17210 Nicotine dependence, cigarettes, uncomplicated: Secondary | ICD-10-CM | POA: Diagnosis not present

## 2016-03-25 DIAGNOSIS — O26893 Other specified pregnancy related conditions, third trimester: Secondary | ICD-10-CM | POA: Diagnosis not present

## 2016-03-25 DIAGNOSIS — G43909 Migraine, unspecified, not intractable, without status migrainosus: Secondary | ICD-10-CM | POA: Insufficient documentation

## 2016-03-25 DIAGNOSIS — Z7982 Long term (current) use of aspirin: Secondary | ICD-10-CM | POA: Insufficient documentation

## 2016-03-25 LAB — URINALYSIS, ROUTINE W REFLEX MICROSCOPIC
Bilirubin Urine: NEGATIVE
Glucose, UA: NEGATIVE mg/dL
Hgb urine dipstick: NEGATIVE
Ketones, ur: NEGATIVE mg/dL
LEUKOCYTES UA: NEGATIVE
NITRITE: NEGATIVE
PROTEIN: NEGATIVE mg/dL
Specific Gravity, Urine: 1.01 (ref 1.005–1.030)
pH: 6 (ref 5.0–8.0)

## 2016-03-25 NOTE — MAU Provider Note (Signed)
Chief Complaint:  Decreased Fetal Movement   First Provider Initiated Contact with Patient 03/25/16 1918      HPI: [name] is a 35 y.o. [id number] at [redacted]w[redacted]d who presents to maternity admissions reporting decreased fetal movement and pelvic pressure. States that she had not felt baby move for most of the day today, although she has felt baby move since she has been here. She is is also feeling some lower pelvic pain and pressure. Denies leakage of fluid, vaginal bleeding, dysuria, hematuria, urinary frequency, fever, chills, nausea or vomiting. Reports just finishing treatment for UTI.  Pregnancy Course:  PNC at [facility] Pregnancy complicated by: - Chronic HTN,  - Obesity - AMA  Past Medical History: Past Medical History:  Diagnosis Date  . Gestational diabetes    with 1st pregnancy  . Hypertension   . Migraine   . Urinary tract infection     Past obstetric history: OB History  Gravida Para Term Preterm AB Living  7 3 3  0 3 3  SAB TAB Ectopic Multiple Live Births  1 2 0 0 3    # Outcome Date GA Lbr Len/2nd Weight Sex Delivery Anes PTL Lv  7 Current           6 Term 03/01/13 [redacted]w[redacted]d 05:51 / 00:08 7 lb 2.3 oz (3.24 kg) M Vag-Spont EPI  LIV  5 Term 07/28/08 [redacted]w[redacted]d 10:00 6 lb 13 oz (3.09 kg) M Vag-Spont EPI N LIV  4 Term 10/07/06 [redacted]w[redacted]d 05:00 6 lb 2 oz (2.778 kg) M Vag-Spont EPI N LIV  3 TAB 2003          2 TAB 2002          1 SAB              Birth Comments: D & C      Past Surgical History: Past Surgical History:  Procedure Laterality Date  . CHOLECYSTECTOMY N/A 11/2013  . DILATION AND EVACUATION  03/09/2011   Procedure: DILATATION AND EVACUATION (D&E);  Surgeon: Donnamae Jude, MD;  Location: [facility];  Service: Gynecology;  Laterality: N/A;  . TONSILLECTOMY  1988  . Tubes in ears  Baby     Family History: Family History  Problem Relation Age of Onset  . Hypertension Father   . Heart failure Father   . Hepatitis Father   . Cirrhosis Father   . Hyperlipidemia  Father   . Migraines Father   . Mental illness Father     Depression  . Diabetes Mother   . Osteoarthritis Mother   . Hypertension Mother   . Hypothyroidism Mother   . Heart disease Mother   . Hyperlipidemia Mother   . Migraines Mother   . Mental illness Mother     Depression  . Breast cancer Maternal Aunt 70  . Cancer Sister 50    Cervical  . Heart disease Sister     Heart Stops  . Migraines Sister   . Seizures Sister 11    unsure if this or heart problem  . Mental illness Sister     depression  . Heart disease Maternal Grandmother     Social History: Social History  Substance Use Topics  . Smoking status: Current Some Day Smoker    Packs/day: 0.50    Years: 12.00    Types: Cigarettes  . Smokeless tobacco: Never Used  . Alcohol use 0.5 oz/week    1 Standard drinks or equivalent per week  Comment: prior to pregnancy    Allergies: No Known Allergies  Meds:  Prescriptions Prior to Admission  Medication Sig Dispense Refill Last Dose  . acetaminophen (TYLENOL) 500 MG tablet Take 1,000 mg by mouth every 6 (six) hours as needed for pain.   Taking  . aspirin EC 81 MG tablet Take 1 tablet (81 mg total) by mouth daily. Take after 12 weeks for prevention of preeclampsia later in pregnancy 300 tablet 2 Taking  . atenolol (TENORMIN) 50 MG tablet TAKE 1 TABLET BY MOUTH EVERY DAY 30 tablet 3 Taking  . ferrous sulfate (FERROUSUL) 325 (65 FE) MG tablet Take 1 tablet (325 mg total) by mouth daily with breakfast. 30 minutes BEFORE food, with OJ or vitamin C. 90 tablet 3 Taking  . flintstones complete (FLINTSTONES) 60 MG chewable tablet Chew 2 tablets by mouth daily.   Taking  . nitrofurantoin, macrocrystal-monohydrate, (MACROBID) 100 MG capsule Take 1 capsule (100 mg total) by mouth 2 (two) times daily. 14 capsule 0 Taking    I have reviewed patient's Past Medical Hx, Surgical Hx, Family Hx, Social Hx, medications and allergies.   ROS:  A comprehensive ROS was negative except  per HPI.    Physical Exam  Patient Vitals for the past 24 hrs:  BP Temp Temp src Pulse Resp  03/25/16 1920 136/83 - - 85 -  03/25/16 1857 142/80 98.6 F (37 C) Oral 86 16   Constitutional: Well-developed, well-nourished female in no acute distress.  Cardiovascular: normal rate Respiratory: normal effort GI: Abd soft, non-tender, gravid appropriate for gestational age. MS: Extremities nontender, no edema, normal ROM Neurologic: Alert and oriented x 4.  GU: No suprapubic or CVAT.  Dilation: Fingertip Effacement (%): Thick Cervical Position: Posterior Station: Ballotable Exam by:: Dr. Buzzy Han   FHT:  Baseline 130 , moderate variability, accelerations present, no decelerations Contractions: occasional   Labs: Results for orders placed or performed during the hospital encounter of 03/25/16 (from the past 24 hour(s))  Urinalysis, Routine w reflex microscopic (not at [facility])     Status: None   Collection Time: 03/25/16  6:45 PM  Result Value Ref Range   Color, Urine YELLOW YELLOW   APPearance CLEAR CLEAR   Specific Gravity, Urine 1.010 1.005 - 1.030   pH 6.0 5.0 - 8.0   Glucose, UA NEGATIVE NEGATIVE mg/dL   Hgb urine dipstick NEGATIVE NEGATIVE   Bilirubin Urine NEGATIVE NEGATIVE   Ketones, ur NEGATIVE NEGATIVE mg/dL   Protein, ur NEGATIVE NEGATIVE mg/dL   Nitrite NEGATIVE NEGATIVE   Leukocytes, UA NEGATIVE NEGATIVE    MAU Course: - Reactive NST - SVE reassuring   MDM: Plan of care reviewed with patient, including labs and tests ordered and medical treatment.  Assessment: 35 y.o. [name] with IUP at [redacted]w[redacted]d who presents with concern of decreased fetal movement and pelvic pressure. Reactive NST, and feeling baby move here. SVE reassuring, not in PTL. UA neg.  Plan: - Discharge home in stable condition.  - Labor precautions and fetal kick counts    Gailen Shelter, MD 03/25/2016 7:32 PM

## 2016-03-25 NOTE — MAU Note (Signed)
Report given to Dr. Lambert Keto patient presents with decreased fetal movement today fhr reassuring at this time, also c/o upper abdominal pain that started today that she states is a pulling sensation.

## 2016-03-25 NOTE — MAU Note (Signed)
Decreased fetal movement all day and having pain in upper abdominal region.

## 2016-03-25 NOTE — Discharge Instructions (Signed)

## 2016-04-03 ENCOUNTER — Ambulatory Visit (INDEPENDENT_AMBULATORY_CARE_PROVIDER_SITE_OTHER): Payer: 59 | Admitting: Obstetrics & Gynecology

## 2016-04-03 VITALS — BP 127/82 | HR 83 | Wt 201.0 lb

## 2016-04-03 DIAGNOSIS — O0993 Supervision of high risk pregnancy, unspecified, third trimester: Secondary | ICD-10-CM

## 2016-04-03 DIAGNOSIS — O10013 Pre-existing essential hypertension complicating pregnancy, third trimester: Secondary | ICD-10-CM

## 2016-04-03 DIAGNOSIS — O09523 Supervision of elderly multigravida, third trimester: Secondary | ICD-10-CM

## 2016-04-03 NOTE — Progress Notes (Signed)
   PRENATAL VISIT NOTE  Subjective:  [name] is a 35 y.o. [phone] at [redacted]w[redacted]d being seen today for ongoing prenatal care.  She is currently monitored for the following issues for this high-risk pregnancy and has HTN (hypertension), benign; Hypertriglyceridemia; Supervision of high risk pregnancy, antepartum; Hypertension in pregnancy, essential, antepartum; AMA (advanced maternal age) multigravida 96+; Desires sterilization; Obesity in pregnancy; and Carpal tunnel syndrome during pregnancy on her problem list.  Patient reports no complaints. Was seen in MAU at [redacted]w[redacted]d for decreased fetal movement, had reactive NST.  Contractions: Not present. Vag. Bleeding: None.  Movement: Present. Denies leaking of fluid.   The following portions of the patient's history were reviewed and updated as appropriate: allergies, current medications, past family history, past medical history, past social history, past surgical history and problem list. Problem list updated.  Objective:   Vitals:   04/03/16 1106  BP: 127/82  Pulse: 83  Weight: 201 lb (91.2 kg)    Fetal Status: Fetal Heart Rate (bpm): 135 Fundal Height: 33 cm Movement: Present     General:  Alert, oriented and cooperative. Patient is in no acute distress.  Skin: Skin is warm and dry. No rash noted.   Cardiovascular: Normal heart rate noted  Respiratory: Normal respiratory effort, no problems with respiration noted  Abdomen: Soft, gravid, appropriate for gestational age. Pain/Pressure: Present     Pelvic:  Cervical exam deferred        Extremities: Normal range of motion.  Edema: None  Mental Status: Normal mood and affect. Normal behavior. Normal judgment and thought content.   Urinalysis: Urine Protein: Negative Urine Glucose: Negative NST performed today was reviewed and was found to be reactive.   Assessment and Plan:  Pregnancy: [name] at [redacted]w[redacted]d  1. Hypertension in pregnancy, essential, antepartum, third trimester Normal BP,  continue ASA.  Continue recommended antenatal testing and prenatal care. BPP added on to already scheduled growth scan next week. - [place] MFM FETAL BPP WO NON STRESS; Future  2. Supervision of high risk pregnancy, antepartum, third trimester Preterm labor symptoms, preeclampsia precautions and general obstetric precautions including but not limited to vaginal bleeding, contractions, leaking of fluid and fetal movement were reviewed in detail with the patient. Please refer to After Visit Summary for other counseling recommendations.  Return in about 2 weeks (around 04/17/2016) for OB Visit, NST, AFI  04/20/16  NST only.  Osborne Oman, MD

## 2016-04-03 NOTE — Patient Instructions (Signed)

## 2016-04-10 ENCOUNTER — Ambulatory Visit (HOSPITAL_COMMUNITY)
Admission: RE | Admit: 2016-04-10 | Discharge: 2016-04-10 | Disposition: A | Discharge status: Home / Self Care | Payer: 59 | Source: Ambulatory Visit | Attending: Family Medicine | Admitting: Family Medicine

## 2016-04-10 ENCOUNTER — Encounter (HOSPITAL_COMMUNITY): Payer: Self-pay

## 2016-04-10 DIAGNOSIS — O99213 Obesity complicating pregnancy, third trimester: Secondary | ICD-10-CM | POA: Insufficient documentation

## 2016-04-10 DIAGNOSIS — O10013 Pre-existing essential hypertension complicating pregnancy, third trimester: Secondary | ICD-10-CM | POA: Insufficient documentation

## 2016-04-10 DIAGNOSIS — O99333 Smoking (tobacco) complicating pregnancy, third trimester: Secondary | ICD-10-CM | POA: Insufficient documentation

## 2016-04-10 DIAGNOSIS — Z3A34 34 weeks gestation of pregnancy: Secondary | ICD-10-CM | POA: Insufficient documentation

## 2016-04-10 DIAGNOSIS — O09293 Supervision of pregnancy with other poor reproductive or obstetric history, third trimester: Secondary | ICD-10-CM | POA: Insufficient documentation

## 2016-04-10 DIAGNOSIS — O0993 Supervision of high risk pregnancy, unspecified, third trimester: Secondary | ICD-10-CM

## 2016-04-10 DIAGNOSIS — O09523 Supervision of elderly multigravida, third trimester: Secondary | ICD-10-CM | POA: Insufficient documentation

## 2016-04-10 IMAGING — US US MFM FETAL BPP W/O NON-STRESS
1 series · 14 of 28 positions shown · non-contrast
Comparison: none

[Series 1: us mfm fetal bpp w/o non-stress · 59 acquisitions, 14 frames shown]
[im 3/59]
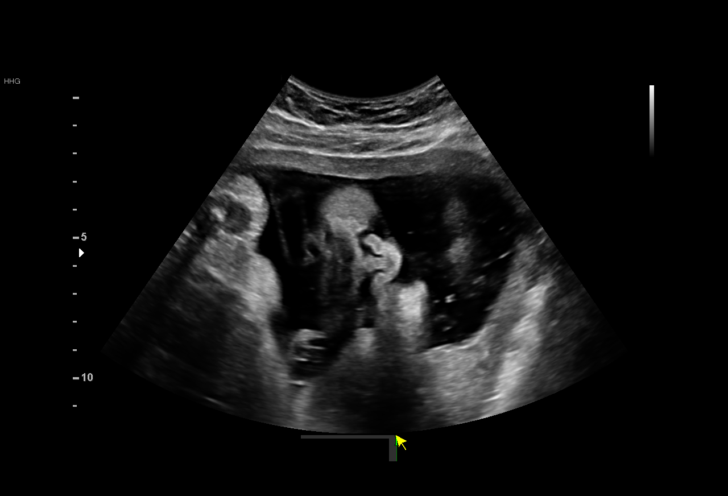
[im 7/59]
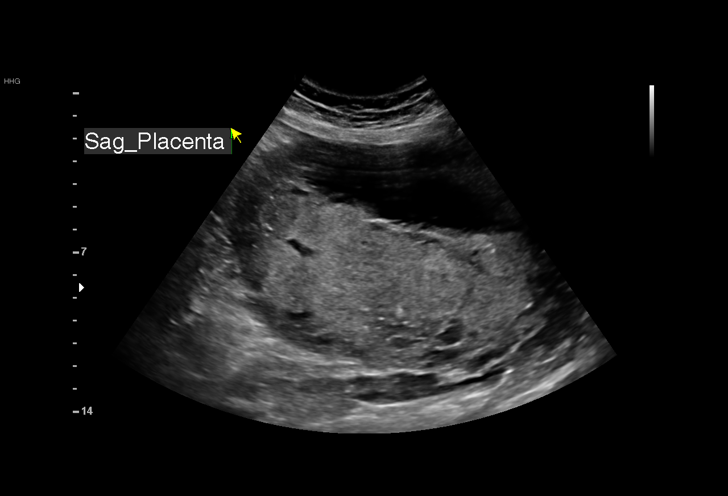
[im 11/59]
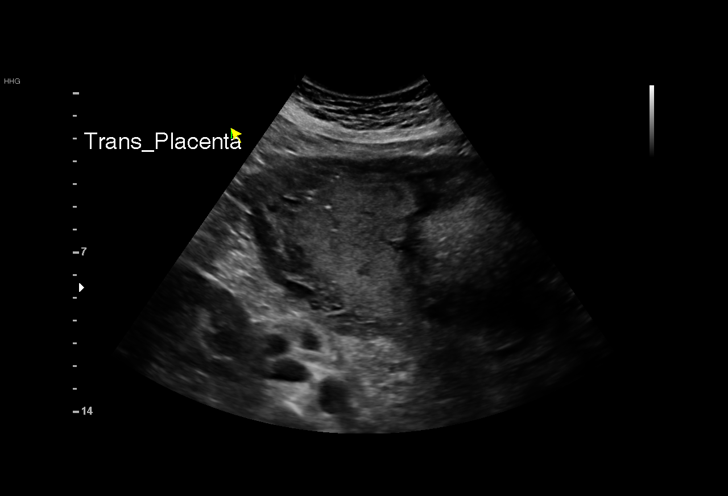
[im 16/59]
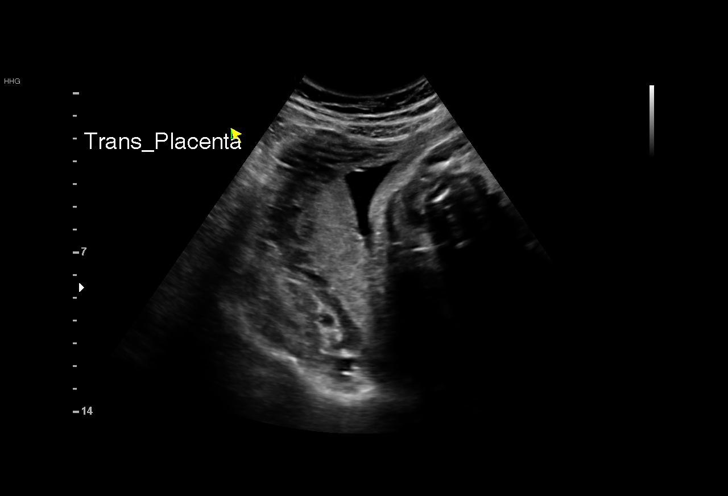
[im 20/59]
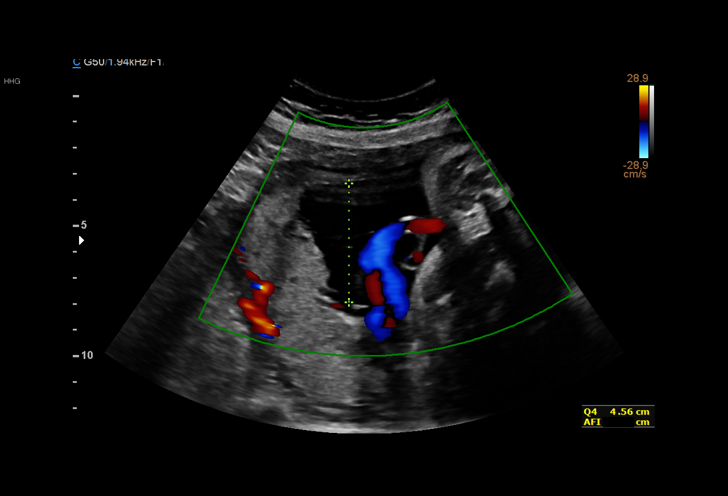
[im 24/59]
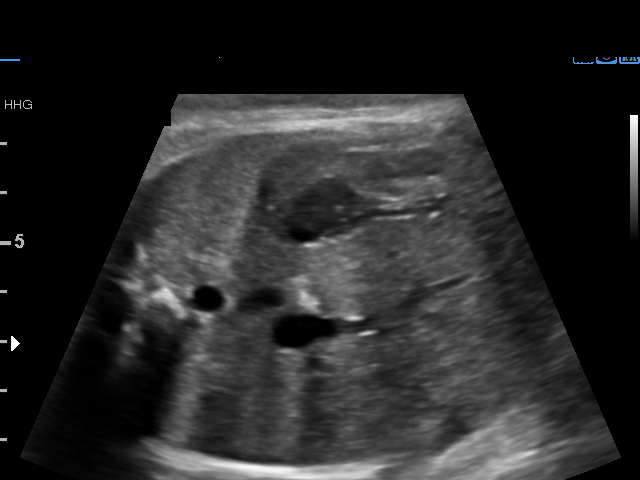
[im 28/59]
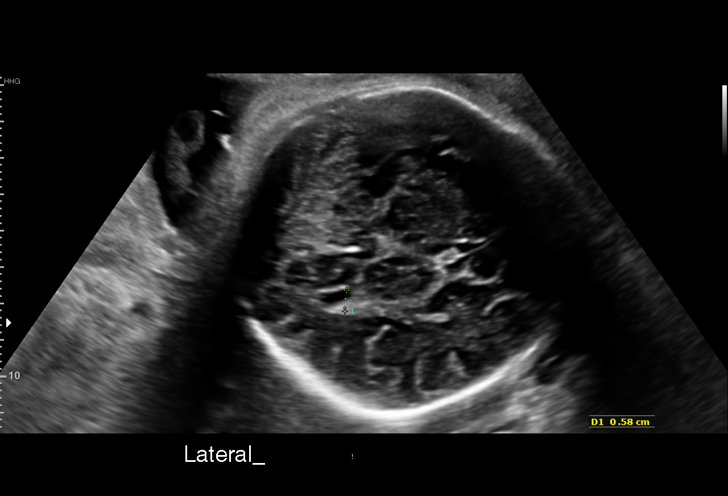
[im 33/59]
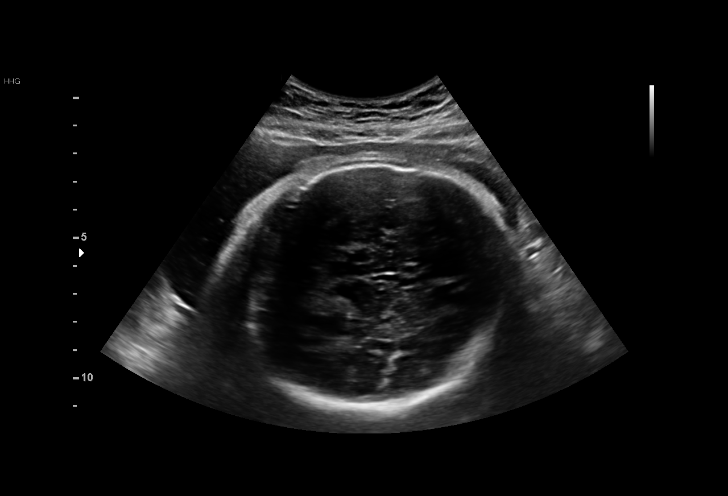
[im 37/59]
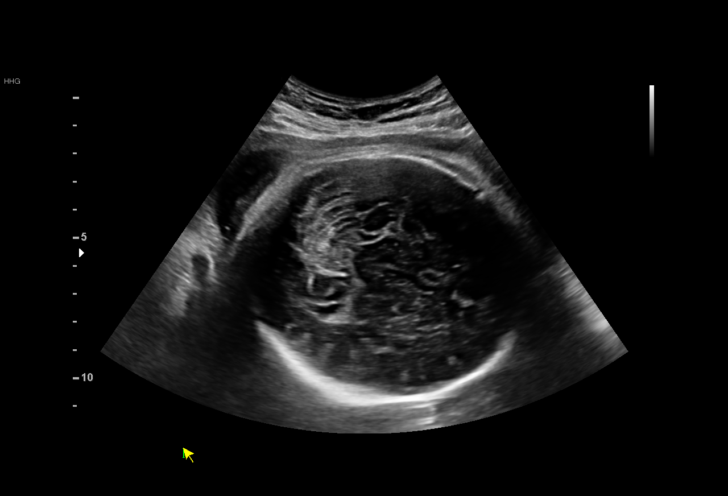
[im 41/59]
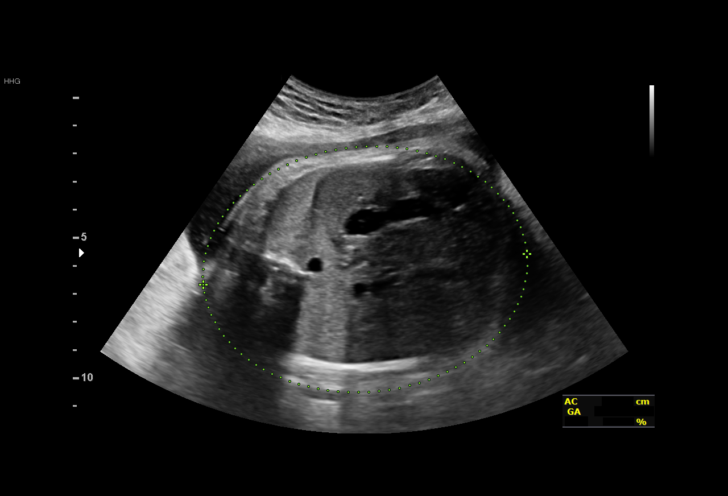
[im 46/59]
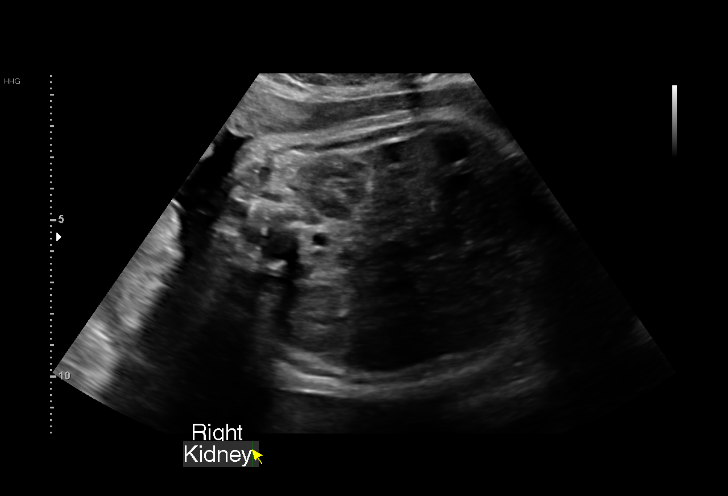
[im 50/59]
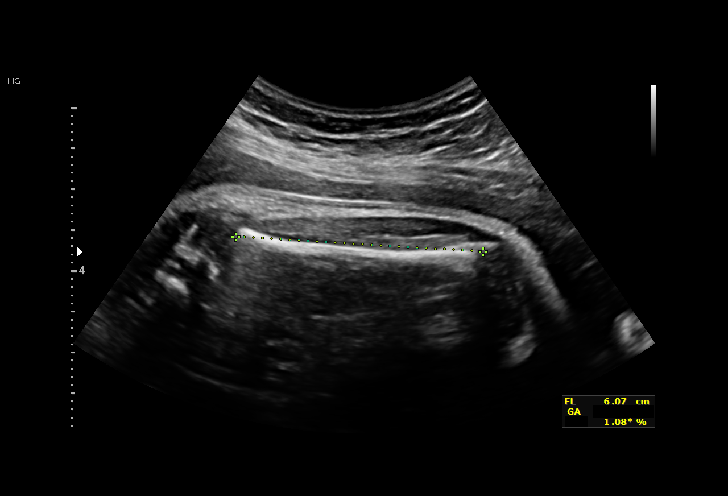
[im 54/59]
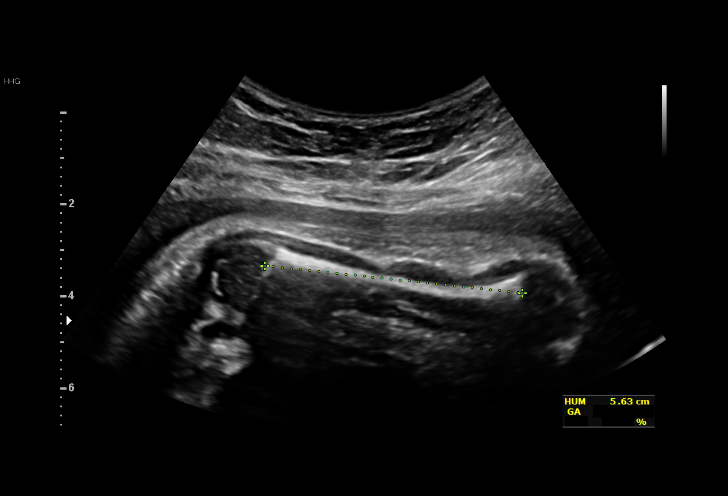
[im 59/59]
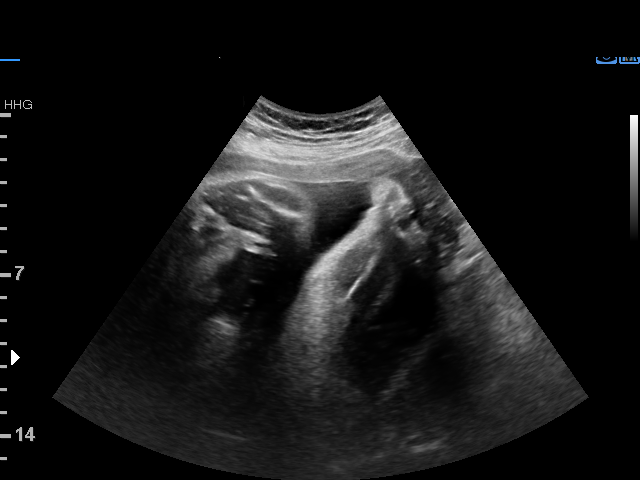

[14 of 28 positions shown; findings below may reference images not displayed]

[REDACTED]

1  AMAZIGH            [PHONE_NUMBER]      [PHONE_NUMBER]     [PHONE_NUMBER]
2  AMAZIGH           [PHONE_NUMBER]      [PHONE_NUMBER]     [PHONE_NUMBER]
Indications

34 weeks gestation of pregnancy
Hypertension - Chronic/Pre-existing; ASA       [F2]
and atenolol
Advanced maternal age multigravida 35+,        [F2]
third trimester; low risk NIPS
Obesity complicating pregnancy, third          [F2]
trimester
Poor obstetric history: Previous gestational   [F2]
diabetes
Smoking complicating pregnancy, third          [F2]
trimester
OB History

Blood Type:            Height:  5'4"   Weight (lb):  194      BMI:
Gravidity:    7         Term:   3        Prem:   0        SAB:   1
TOP:          2       Ectopic:  0        Living: 3
Fetal Evaluation

Num Of Fetuses:     1
Fetal Heart         124
Rate(bpm):
Cardiac Activity:   Observed
Presentation:       Cephalic
Placenta:           Posterior right, above cervical os
Amniotic Fluid
AFI FV:      Subjectively within normal limits

AFI Sum(cm)     %Tile       Largest Pocket(cm)
19.76           74

RUQ(cm)       RLQ(cm)       LUQ(cm)        LLQ(cm)
5.65
Biophysical Evaluation

Amniotic F.V:   Within normal limits       F. Tone:        Observed
F. Movement:    Observed                   Score:          [DATE]
F. Breathing:   Observed
Biometry

BPD:      86.9  mm     G. Age:  35w 0d         68  %    CI:        77.75   %   70 - 86
FL/HC:      19.9   %   19.4 -
HC:      311.9  mm     G. Age:  34w 6d         27  %    HC/AC:      0.98       0.96 -
AC:      318.6  mm     G. Age:  35w 5d         87  %    FL/BPD:     71.5   %   71 - 87
FL:       62.1  mm     G. Age:  32w 1d          4  %    FL/AC:      19.5   %   20 - 24
HUM:      55.2  mm     G. Age:  32w 1d         14  %

CM:        5.6  mm
Est. FW:    [F2]  gm      5 lb 8 oz     65  %
Gestational Age

LMP:           36w 3d       Date:   [DATE]                 EDD:   [DATE]
U/S Today:     34w 3d                                        EDD:   [DATE]
Best:          34w 3d    Det. By:   Early Ultrasound         EDD:   [DATE]
([DATE])
Anatomy

Cranium:               Appears normal         Aortic Arch:            Previously seen
Cavum:                 Appears normal         Ductal Arch:            Previously seen
Ventricles:            Appears normal         Diaphragm:              Appears normal
Choroid Plexus:        Appears normal         Stomach:                Appears normal, left
sided
Cerebellum:            Previously seen        Abdomen:                Appears normal
Posterior Fossa:       Previously seen        Abdominal Wall:         Previously seen
Nuchal Fold:           Not applicable (>20    Cord Vessels:           Previously seen
wks GA)
Face:                  Orbits and profile     Kidneys:                Appear normal
previously seen
Lips:                  Appears normal         Bladder:                Appears normal
Thoracic:              Appears normal         Spine:                  Previously seen
Heart:                 Appears normal         Upper Extremities:      Previously seen
(4CH, axis, and situs
RVOT:                  Previously seen        Lower Extremities:      Previously seen
LVOT:                  Previously seen

Other:  Fetus appears to be a male. Heels and 5th digit previously seen.
Nasal bone previously seen. Technically difficult due to advanced
gestational age.
Cervix Uterus Adnexa
Cervix
Not visualized (advanced GA >[F2])

Uterus
No abnormality visualized.

Left Ovary
Not visualized.

Right Ovary
Not visualized.
Impression

SIUP at 34+3 weeks
Normal interval anatomy; anatomic survey complete
Normal amniotic fluid volume
Appropriate interval growth with EFW at the 65th %tile
BPP [DATE]
Recommendations

Continue twice weekly NSTs with weekly AFIs
Follow-up as clinically indicated

## 2016-04-17 ENCOUNTER — Encounter: Payer: 59 | Admitting: Family Medicine

## 2016-04-17 ENCOUNTER — Ambulatory Visit (INDEPENDENT_AMBULATORY_CARE_PROVIDER_SITE_OTHER): Payer: 59 | Admitting: Family Medicine

## 2016-04-17 VITALS — BP 124/81 | HR 91 | Wt 200.0 lb

## 2016-04-17 DIAGNOSIS — Z3689 Encounter for other specified antenatal screening: Secondary | ICD-10-CM | POA: Diagnosis not present

## 2016-04-17 DIAGNOSIS — O10013 Pre-existing essential hypertension complicating pregnancy, third trimester: Secondary | ICD-10-CM | POA: Diagnosis not present

## 2016-04-17 DIAGNOSIS — O0990 Supervision of high risk pregnancy, unspecified, unspecified trimester: Secondary | ICD-10-CM

## 2016-04-17 DIAGNOSIS — O09523 Supervision of elderly multigravida, third trimester: Secondary | ICD-10-CM | POA: Diagnosis not present

## 2016-04-17 DIAGNOSIS — O10019 Pre-existing essential hypertension complicating pregnancy, unspecified trimester: Secondary | ICD-10-CM

## 2016-04-17 NOTE — Patient Instructions (Signed)
Third Trimester of Pregnancy The third trimester is from week 29 through week 42, months 7 through 9. The third trimester is a time when the fetus is growing rapidly. At the end of the ninth month, the fetus is about 20 inches in length and weighs 6-10 pounds.  BODY CHANGES Your body goes through many changes during pregnancy. The changes vary from woman to woman.   Your weight will continue to increase. You can expect to gain 25-35 pounds (11-16 kg) by the end of the pregnancy.  You may begin to get stretch marks on your hips, abdomen, and breasts.  You may urinate more often because the fetus is moving lower into your pelvis and pressing on your bladder.  You may develop or continue to have heartburn as a result of your pregnancy.  You may develop constipation because certain hormones are causing the muscles that push waste through your intestines to slow down.  You may develop hemorrhoids or swollen, bulging veins (varicose veins).  You may have pelvic pain because of the weight gain and pregnancy hormones relaxing your joints between the bones in your pelvis. Backaches may result from overexertion of the muscles supporting your posture.  You may have changes in your hair. These can include thickening of your hair, rapid growth, and changes in texture. Some women also have hair loss during or after pregnancy, or hair that feels dry or thin. Your hair will most likely return to normal after your baby is born.  Your breasts will continue to grow and be tender. A yellow discharge may leak from your breasts called colostrum.  Your belly button may stick out.  You may feel short of breath because of your expanding uterus.  You may notice the fetus "dropping," or moving lower in your abdomen.  You may have a bloody mucus discharge. This usually occurs a few days to a week before labor begins.  Your cervix becomes thin and soft (effaced) near your due date. WHAT TO EXPECT AT YOUR  PRENATAL EXAMS  You will have prenatal exams every 2 weeks until week 36. Then, you will have weekly prenatal exams. During a routine prenatal visit:  You will be weighed to make sure you and the fetus are growing normally.  Your blood pressure is taken.  Your abdomen will be measured to track your baby's growth.  The fetal heartbeat will be listened to.  Any test results from the previous visit will be discussed.  You may have a cervical check near your due date to see if you have effaced. At around 36 weeks, your caregiver will check your cervix. At the same time, your caregiver will also perform a test on the secretions of the vaginal tissue. This test is to determine if a type of bacteria, Group B streptococcus, is present. Your caregiver will explain this further. Your caregiver may ask you:  What your birth plan is.  How you are feeling.  If you are feeling the baby move.  If you have had any abnormal symptoms, such as leaking fluid, bleeding, severe headaches, or abdominal cramping.  If you are using any tobacco products, including cigarettes, chewing tobacco, and electronic cigarettes.  If you have any questions. Other tests or screenings that may be performed during your third trimester include:  Blood tests that check for low iron levels (anemia).  Fetal testing to check the health, activity level, and growth of the fetus. Testing is done if you have certain medical conditions or if   there are problems during the pregnancy.  HIV (human immunodeficiency virus) testing. If you are at high risk, you may be screened for HIV during your third trimester of pregnancy. FALSE LABOR You may feel small, irregular contractions that eventually go away. These are called Braxton Hicks contractions, or false labor. Contractions may last for hours, days, or even weeks before true labor sets in. If contractions come at regular intervals, intensify, or become painful, it is best to be seen  by your caregiver.  SIGNS OF LABOR   Menstrual-like cramps.  Contractions that are 5 minutes apart or less.  Contractions that start on the top of the uterus and spread down to the lower abdomen and back.  A sense of increased pelvic pressure or back pain.  A watery or bloody mucus discharge that comes from the vagina. If you have any of these signs before the 37th week of pregnancy, call your caregiver right away. You need to go to the hospital to get checked immediately. HOME CARE INSTRUCTIONS   Avoid all smoking, herbs, alcohol, and unprescribed drugs. These chemicals affect the formation and growth of the baby.  Do not use any tobacco products, including cigarettes, chewing tobacco, and electronic cigarettes. If you need help quitting, ask your health care provider. You may receive counseling support and other resources to help you quit.  Follow your caregiver's instructions regarding medicine use. There are medicines that are either safe or unsafe to take during pregnancy.  Exercise only as directed by your caregiver. Experiencing uterine cramps is a good sign to stop exercising.  Continue to eat regular, healthy meals.  Wear a good support bra for breast tenderness.  Do not use hot tubs, steam rooms, or saunas.  Wear your seat belt at all times when driving.  Avoid raw meat, uncooked cheese, cat litter boxes, and soil used by cats. These carry germs that can cause birth defects in the baby.  Take your prenatal vitamins.  Take 1500-2000 mg of calcium daily starting at the 20th week of pregnancy until you deliver your baby.  Try taking a stool softener (if your caregiver approves) if you develop constipation. Eat more high-fiber foods, such as fresh vegetables or fruit and whole grains. Drink plenty of fluids to keep your urine clear or pale yellow.  Take warm sitz baths to soothe any pain or discomfort caused by hemorrhoids. Use hemorrhoid cream if your caregiver  approves.  If you develop varicose veins, wear support hose. Elevate your feet for 15 minutes, 3-4 times a day. Limit salt in your diet.  Avoid heavy lifting, wear low heal shoes, and practice good posture.  Rest a lot with your legs elevated if you have leg cramps or low back pain.  Visit your dentist if you have not gone during your pregnancy. Use a soft toothbrush to brush your teeth and be gentle when you floss.  A sexual relationship may be continued unless your caregiver directs you otherwise.  Do not travel far distances unless it is absolutely necessary and only with the approval of your caregiver.  Take prenatal classes to understand, practice, and ask questions about the labor and delivery.  Make a trial run to the hospital.  Pack your hospital bag.  Prepare the baby's nursery.  Continue to go to all your prenatal visits as directed by your caregiver. SEEK MEDICAL CARE IF:  You are unsure if you are in labor or if your water has broken.  You have dizziness.  You have   mild pelvic cramps, pelvic pressure, or nagging pain in your abdominal area.  You have persistent nausea, vomiting, or diarrhea.  You have a bad smelling vaginal discharge.  You have pain with urination. SEEK IMMEDIATE MEDICAL CARE IF:   You have a fever.  You are leaking fluid from your vagina.  You have spotting or bleeding from your vagina.  You have severe abdominal cramping or pain.  You have rapid weight loss or gain.  You have shortness of breath with chest pain.  You notice sudden or extreme swelling of your face, hands, ankles, feet, or legs.  You have not felt your baby move in over an hour.  You have severe headaches that do not go away with medicine.  You have vision changes.   This information is not intended to replace advice given to you by your health care provider. Make sure you discuss any questions you have with your health care provider.   Document Released:  06/12/2001 Document Revised: 07/09/2014 Document Reviewed: 08/19/2012 Elsevier Interactive Patient Education 2016 [employer]  Breastfeeding Deciding to breastfeed is one of the best choices you can make for you and your baby. A change in hormones during pregnancy causes your breast tissue to grow and increases the number and size of your milk ducts. These hormones also allow proteins, sugars, and fats from your blood supply to make breast milk in your milk-producing glands. Hormones prevent breast milk from being released before your baby is born as well as prompt milk flow after birth. Once breastfeeding has begun, thoughts of your baby, as well as his or her sucking or crying, can stimulate the release of milk from your milk-producing glands.  BENEFITS OF BREASTFEEDING For Your Baby  Your first milk (colostrum) helps your baby's digestive system function better.  There are antibodies in your milk that help your baby fight off infections.  Your baby has a lower incidence of asthma, allergies, and sudden infant death syndrome.  The nutrients in breast milk are better for your baby than infant formulas and are designed uniquely for your baby's needs.  Breast milk improves your baby's brain development.  Your baby is less likely to develop other conditions, such as childhood obesity, asthma, or type 2 diabetes mellitus. For You  Breastfeeding helps to create a very special bond between you and your baby.  Breastfeeding is convenient. Breast milk is always available at the correct temperature and costs nothing.  Breastfeeding helps to burn calories and helps you lose the weight gained during pregnancy.  Breastfeeding makes your uterus contract to its prepregnancy size faster and slows bleeding (lochia) after you give birth.   Breastfeeding helps to lower your risk of developing type 2 diabetes mellitus, osteoporosis, and breast or ovarian cancer later in life. SIGNS THAT YOUR BABY IS  HUNGRY Early Signs of Hunger  Increased alertness or activity.  Stretching.  Movement of the head from side to side.  Movement of the head and opening of the mouth when the corner of the mouth or cheek is stroked (rooting).  Increased sucking sounds, smacking lips, cooing, sighing, or squeaking.  Hand-to-mouth movements.  Increased sucking of fingers or hands. Late Signs of Hunger  Fussing.  Intermittent crying. Extreme Signs of Hunger Signs of extreme hunger will require calming and consoling before your baby will be able to breastfeed successfully. Do not wait for the following signs of extreme hunger to occur before you initiate breastfeeding:  Restlessness.  A loud, strong cry.  Screaming.   BREASTFEEDING BASICS Breastfeeding Initiation  Find a comfortable place to sit or lie down, with your neck and back well supported.  Place a pillow or rolled up blanket under your baby to bring him or her to the level of your breast (if you are seated). Nursing pillows are specially designed to help support your arms and your baby while you breastfeed.  Make sure that your baby's abdomen is facing your abdomen.  Gently massage your breast. With your fingertips, massage from your chest wall toward your nipple in a circular motion. This encourages milk flow. You may need to continue this action during the feeding if your milk flows slowly.  Support your breast with 4 fingers underneath and your thumb above your nipple. Make sure your fingers are well away from your nipple and your baby's mouth.  Stroke your baby's lips gently with your finger or nipple.  When your baby's mouth is open wide enough, quickly bring your baby to your breast, placing your entire nipple and as much of the colored area around your nipple (areola) as possible into your baby's mouth.  More areola should be visible above your baby's upper lip than below the lower lip.  Your baby's tongue should be between his  or her lower gum and your breast.  Ensure that your baby's mouth is correctly positioned around your nipple (latched). Your baby's lips should create a seal on your breast and be turned out (everted).  It is common for your baby to suck about 2-3 minutes in order to start the flow of breast milk. Latching Teaching your baby how to latch on to your breast properly is very important. An improper latch can cause nipple pain and decreased milk supply for you and poor weight gain in your baby. Also, if your baby is not latched onto your nipple properly, he or she may swallow some air during feeding. This can make your baby fussy. Burping your baby when you switch breasts during the feeding can help to get rid of the air. However, teaching your baby to latch on properly is still the best way to prevent fussiness from swallowing air while breastfeeding. Signs that your baby has successfully latched on to your nipple:  Silent tugging or silent sucking, without causing you pain.  Swallowing heard between every 3-4 sucks.  Muscle movement above and in front of his or her ears while sucking. Signs that your baby has not successfully latched on to nipple:  Sucking sounds or smacking sounds from your baby while breastfeeding.  Nipple pain. If you think your baby has not latched on correctly, slip your finger into the corner of your baby's mouth to break the suction and place it between your baby's gums. Attempt breastfeeding initiation again. Signs of Successful Breastfeeding Signs from your baby:  A gradual decrease in the number of sucks or complete cessation of sucking.  Falling asleep.  Relaxation of his or her body.  Retention of a small amount of milk in his or her mouth.  Letting go of your breast by himself or herself. Signs from you:  Breasts that have increased in firmness, weight, and size 1-3 hours after feeding.  Breasts that are softer immediately after  breastfeeding.  Increased milk volume, as well as a change in milk consistency and color by the fifth day of breastfeeding.  Nipples that are not sore, cracked, or bleeding. Signs That Your Baby is Getting Enough Milk  Wetting at least 3 diapers in a 24-hour period.   The urine should be clear and pale yellow by age 5 days.  At least 3 stools in a 24-hour period by age 5 days. The stool should be soft and yellow.  At least 3 stools in a 24-hour period by age 7 days. The stool should be seedy and yellow.  No loss of weight greater than 10% of birth weight during the first 3 days of age.  Average weight gain of 4-7 ounces (113-198 g) per week after age 4 days.  Consistent daily weight gain by age 5 days, without weight loss after the age of 2 weeks. After a feeding, your baby may spit up a small amount. This is common. BREASTFEEDING FREQUENCY AND DURATION Frequent feeding will help you make more milk and can prevent sore nipples and breast engorgement. Breastfeed when you feel the need to reduce the fullness of your breasts or when your baby shows signs of hunger. This is called "breastfeeding on demand." Avoid introducing a pacifier to your baby while you are working to establish breastfeeding (the first 4-6 weeks after your baby is born). After this time you may choose to use a pacifier. Research has shown that pacifier use during the first year of a baby's life decreases the risk of sudden infant death syndrome (SIDS). Allow your baby to feed on each breast as long as he or she wants. Breastfeed until your baby is finished feeding. When your baby unlatches or falls asleep while feeding from the first breast, offer the second breast. Because newborns are often sleepy in the first few weeks of life, you may need to awaken your baby to get him or her to feed. Breastfeeding times will vary from baby to baby. However, the following rules can serve as a guide to help you ensure that your baby is  properly fed:  Newborns (babies 4 weeks of age or younger) may breastfeed every 1-3 hours.  Newborns should not go longer than 3 hours during the day or 5 hours during the night without breastfeeding.  You should breastfeed your baby a minimum of 8 times in a 24-hour period until you begin to introduce solid foods to your baby at around 6 months of age. BREAST MILK PUMPING Pumping and storing breast milk allows you to ensure that your baby is exclusively fed your breast milk, even at times when you are unable to breastfeed. This is especially important if you are going back to work while you are still breastfeeding or when you are not able to be present during feedings. Your lactation consultant can give you guidelines on how long it is safe to store breast milk. A breast pump is a machine that allows you to pump milk from your breast into a sterile bottle. The pumped breast milk can then be stored in a refrigerator or freezer. Some breast pumps are operated by hand, while others use electricity. Ask your lactation consultant which type will work best for you. Breast pumps can be purchased, but some hospitals and breastfeeding support groups lease breast pumps on a monthly basis. A lactation consultant can teach you how to hand express breast milk, if you prefer not to use a pump. CARING FOR YOUR BREASTS WHILE YOU BREASTFEED Nipples can become dry, cracked, and sore while breastfeeding. The following recommendations can help keep your breasts moisturized and healthy:  Avoid using soap on your nipples.  Wear a supportive bra. Although not required, special nursing bras and tank tops are designed to allow access to your   breasts for breastfeeding without taking off your entire bra or top. Avoid wearing underwire-style bras or extremely tight bras.  Air dry your nipples for 3-4minutes after each feeding.  Use only cotton bra pads to absorb leaked breast milk. Leaking of breast milk between feedings  is normal.  Use lanolin on your nipples after breastfeeding. Lanolin helps to maintain your skin's normal moisture barrier. If you use pure lanolin, you do not need to wash it off before feeding your baby again. Pure lanolin is not toxic to your baby. You may also hand express a few drops of breast milk and gently massage that milk into your nipples and allow the milk to air dry. In the first few weeks after giving birth, some women experience extremely full breasts (engorgement). Engorgement can make your breasts feel heavy, warm, and tender to the touch. Engorgement peaks within 3-5 days after you give birth. The following recommendations can help ease engorgement:  Completely empty your breasts while breastfeeding or pumping. You may want to start by applying warm, moist heat (in the shower or with warm water-soaked hand towels) just before feeding or pumping. This increases circulation and helps the milk flow. If your baby does not completely empty your breasts while breastfeeding, pump any extra milk after he or she is finished.  Wear a snug bra (nursing or regular) or tank top for 1-2 days to signal your body to slightly decrease milk production.  Apply ice packs to your breasts, unless this is too uncomfortable for you.  Make sure that your baby is latched on and positioned properly while breastfeeding. If engorgement persists after 48 hours of following these recommendations, contact your health care provider or a lactation consultant. OVERALL HEALTH CARE RECOMMENDATIONS WHILE BREASTFEEDING  Eat healthy foods. Alternate between meals and snacks, eating 3 of each per day. Because what you eat affects your breast milk, some of the foods may make your baby more irritable than usual. Avoid eating these foods if you are sure that they are negatively affecting your baby.  Drink milk, fruit juice, and water to satisfy your thirst (about 10 glasses a day).  Rest often, relax, and continue to take  your prenatal vitamins to prevent fatigue, stress, and anemia.  Continue breast self-awareness checks.  Avoid chewing and smoking tobacco. Chemicals from cigarettes that pass into breast milk and exposure to secondhand smoke may harm your baby.  Avoid alcohol and drug use, including marijuana. Some medicines that may be harmful to your baby can pass through breast milk. It is important to ask your health care provider before taking any medicine, including all over-the-counter and prescription medicine as well as vitamin and herbal supplements. It is possible to become pregnant while breastfeeding. If birth control is desired, ask your health care provider about options that will be safe for your baby. SEEK MEDICAL CARE IF:  You feel like you want to stop breastfeeding or have become frustrated with breastfeeding.  You have painful breasts or nipples.  Your nipples are cracked or bleeding.  Your breasts are red, tender, or warm.  You have a swollen area on either breast.  You have a fever or chills.  You have nausea or vomiting.  You have drainage other than breast milk from your nipples.  Your breasts do not become full before feedings by the fifth day after you give birth.  You feel sad and depressed.  Your baby is too sleepy to eat well.  Your baby is having trouble sleeping.     Your baby is wetting less than 3 diapers in a 24-hour period.  Your baby has less than 3 stools in a 24-hour period.  Your baby's skin or the white part of his or her eyes becomes yellow.   Your baby is not gaining weight by 5 days of age. SEEK IMMEDIATE MEDICAL CARE IF:  Your baby is overly tired (lethargic) and does not want to wake up and feed.  Your baby develops an unexplained fever.   This information is not intended to replace advice given to you by your health care provider. Make sure you discuss any questions you have with your health care provider.   Document Released: 06/18/2005  Document Revised: 03/09/2015 Document Reviewed: 12/10/2012 Elsevier Interactive Patient Education 2016 [employer]  

## 2016-04-17 NOTE — Progress Notes (Signed)
   PRENATAL VISIT NOTE  Subjective:  [name] is a 35 y.o. [phone] at [redacted]w[redacted]d being seen today for ongoing prenatal care.  She is currently monitored for the following issues for this high-risk pregnancy and has HTN (hypertension), benign; Hypertriglyceridemia; Supervision of high risk pregnancy, antepartum; Hypertension in pregnancy, essential, antepartum; AMA (advanced maternal age) multigravida 10+; Desires sterilization; Obesity in pregnancy; and Carpal tunnel syndrome during pregnancy on her problem list.  Patient reports no complaints.  Contractions: Irregular. Vag. Bleeding: None.  Movement: Present. Denies leaking of fluid.   The following portions of the patient's history were reviewed and updated as appropriate: allergies, current medications, past family history, past medical history, past social history, past surgical history and problem list. Problem list updated.  Objective:   Vitals:   04/17/16 1337  BP: 124/81  Pulse: 91  Weight: 200 lb (90.7 kg)    Fetal Status: Fetal Heart Rate (bpm): 137   Movement: Present  Presentation: Vertex  General:  Alert, oriented and cooperative. Patient is in no acute distress.  Skin: Skin is warm and dry. No rash noted.   Cardiovascular: Normal heart rate noted  Respiratory: Normal respiratory effort, no problems with respiration noted  Abdomen: Soft, gravid, appropriate for gestational age. Pain/Pressure: Present     Pelvic:  Cervical exam deferred        Extremities: Normal range of motion.  Edema: None  Mental Status: Normal mood and affect. Normal behavior. Normal judgment and thought content.  NST reviewed and reactive. AFI 17.8 U/s 10/10 showed vtx, 5 lb 8 oz, 65%, AFI 19.76 Assessment and Plan:  Pregnancy: [id number] at [redacted]w[redacted]d  1. Pre-existing essential hypertension during pregnancy, antepartum Continue ASA and Atenolol - Amniotic fluid index with NST Continue 2x/wk testing  2. Supervision of high risk pregnancy,  antepartum GBS next visit.   Preterm labor symptoms and general obstetric precautions including but not limited to vaginal bleeding, contractions, leaking of fluid and fetal movement were reviewed in detail with the patient. Please refer to After Visit Summary for other counseling recommendations.  Return in 1 week (on 04/24/2016) for OB visit and NST, NST only in 4 days.  Donnamae Jude, MD

## 2016-04-20 ENCOUNTER — Ambulatory Visit (INDEPENDENT_AMBULATORY_CARE_PROVIDER_SITE_OTHER): Payer: 59 | Admitting: *Deleted

## 2016-04-20 VITALS — BP 113/79 | HR 83 | Wt 200.0 lb

## 2016-04-20 DIAGNOSIS — O09523 Supervision of elderly multigravida, third trimester: Secondary | ICD-10-CM | POA: Diagnosis not present

## 2016-04-20 DIAGNOSIS — O0990 Supervision of high risk pregnancy, unspecified, unspecified trimester: Secondary | ICD-10-CM

## 2016-04-24 ENCOUNTER — Ambulatory Visit (INDEPENDENT_AMBULATORY_CARE_PROVIDER_SITE_OTHER): Payer: 59 | Admitting: Obstetrics & Gynecology

## 2016-04-24 VITALS — BP 126/81 | HR 80 | Wt 200.0 lb

## 2016-04-24 DIAGNOSIS — O99210 Obesity complicating pregnancy, unspecified trimester: Secondary | ICD-10-CM

## 2016-04-24 DIAGNOSIS — Z789 Other specified health status: Secondary | ICD-10-CM

## 2016-04-24 DIAGNOSIS — Z3483 Encounter for supervision of other normal pregnancy, third trimester: Secondary | ICD-10-CM

## 2016-04-24 DIAGNOSIS — Z3689 Encounter for other specified antenatal screening: Secondary | ICD-10-CM | POA: Diagnosis not present

## 2016-04-24 DIAGNOSIS — E669 Obesity, unspecified: Secondary | ICD-10-CM

## 2016-04-24 DIAGNOSIS — O09523 Supervision of elderly multigravida, third trimester: Secondary | ICD-10-CM | POA: Diagnosis not present

## 2016-04-24 DIAGNOSIS — O10013 Pre-existing essential hypertension complicating pregnancy, third trimester: Secondary | ICD-10-CM | POA: Diagnosis not present

## 2016-04-24 DIAGNOSIS — Z302 Encounter for sterilization: Secondary | ICD-10-CM

## 2016-04-24 DIAGNOSIS — O0990 Supervision of high risk pregnancy, unspecified, unspecified trimester: Secondary | ICD-10-CM

## 2016-04-24 DIAGNOSIS — O10019 Pre-existing essential hypertension complicating pregnancy, unspecified trimester: Secondary | ICD-10-CM

## 2016-04-24 DIAGNOSIS — O99213 Obesity complicating pregnancy, third trimester: Secondary | ICD-10-CM | POA: Diagnosis not present

## 2016-04-24 DIAGNOSIS — Z113 Encounter for screening for infections with a predominantly sexual mode of transmission: Secondary | ICD-10-CM | POA: Diagnosis not present

## 2016-04-24 LAB — OB RESULTS CONSOLE GBS: GBS: NEGATIVE

## 2016-04-24 LAB — OB RESULTS CONSOLE GC/CHLAMYDIA: GC PROBE AMP, GENITAL: NEGATIVE

## 2016-04-24 NOTE — Progress Notes (Signed)
   PRENATAL VISIT NOTE  Subjective:  [name] is a 35 y.o. [phone] at [redacted]w[redacted]d being seen today for ongoing prenatal care.  She is currently monitored for the following issues for this high-risk pregnancy and has HTN (hypertension), benign; Hypertriglyceridemia; Supervision of high risk pregnancy, antepartum; Hypertension in pregnancy, essential, antepartum; AMA (advanced maternal age) multigravida 87+; Desires sterilization; Obesity in pregnancy; and Carpal tunnel syndrome during pregnancy on her problem list.  Patient reports no complaints.   .  .   . Denies leaking of fluid.   The following portions of the patient's history were reviewed and updated as appropriate: allergies, current medications, past family history, past medical history, past social history, past surgical history and problem list. Problem list updated.  Objective:  There were no vitals filed for this visit.  Fetal Status:           General:  Alert, oriented and cooperative. Patient is in no acute distress.  Skin: Skin is warm and dry. No rash noted.   Cardiovascular: Normal heart rate noted  Respiratory: Normal respiratory effort, no problems with respiration noted  Abdomen: Soft, gravid, appropriate for gestational age.       Pelvic:  Cervical exam performed        Extremities: Normal range of motion.     Mental Status: Normal mood and affect. Normal behavior. Normal judgment and thought content.   Assessment and Plan:  Pregnancy: [id number] at [redacted]w[redacted]d  1. Encounter for supervision of other normal pregnancy in third trimester  - Culture, beta strep (group b only) - GC/Chlamydia probe amp ([facility])not at [facility]  2. Elderly multigravida in third trimester   3. Desires sterilization   4. Hypertension in pregnancy, essential, antepartum - on baby ASA and atenolol - Schedule IOL for [redacted] weeks EGA  5. Obesity in pregnancy   6. Supervision of high risk pregnancy, antepartum   Preterm labor symptoms and  general obstetric precautions including but not limited to vaginal bleeding, contractions, leaking of fluid and fetal movement were reviewed in detail with the patient. Please refer to After Visit Summary for other counseling recommendations.  Return in about 7 weeks (around 06/12/2016), or continue twice weekly testing , for Postpartum visit in 7 weeks.  Emily Filbert, MD

## 2016-04-24 NOTE — Addendum Note (Signed)
Addended by: Ricka Burdock on: 04/24/2016 11:21 AM   Modules accepted: Orders

## 2016-04-24 NOTE — Progress Notes (Signed)
10/20 NST reviewed and reactive

## 2016-04-25 ENCOUNTER — Telehealth (HOSPITAL_COMMUNITY): Payer: Self-pay | Admitting: *Deleted

## 2016-04-25 LAB — GC/CHLAMYDIA PROBE AMP (~~LOC~~) NOT AT ARMC
Chlamydia: NEGATIVE
Neisseria Gonorrhea: NEGATIVE

## 2016-04-25 NOTE — Telephone Encounter (Signed)
Preadmission screen  

## 2016-04-26 LAB — CULTURE, BETA STREP (GROUP B ONLY)

## 2016-04-27 ENCOUNTER — Other Ambulatory Visit: Payer: Self-pay | Admitting: Advanced Practice Midwife

## 2016-04-27 ENCOUNTER — Other Ambulatory Visit: Payer: 59

## 2016-04-28 ENCOUNTER — Inpatient Hospital Stay (HOSPITAL_COMMUNITY): Payer: 59

## 2016-04-28 ENCOUNTER — Encounter (HOSPITAL_COMMUNITY): Payer: Self-pay

## 2016-04-28 ENCOUNTER — Inpatient Hospital Stay (HOSPITAL_COMMUNITY)
Admission: AD | Admit: 2016-04-28 | Discharge: 2016-04-29 | DRG: 774 | Disposition: A | Discharge status: Home / Self Care | Payer: 59 | Source: Ambulatory Visit | Attending: Obstetrics and Gynecology | Admitting: Obstetrics and Gynecology

## 2016-04-28 ENCOUNTER — Inpatient Hospital Stay (HOSPITAL_COMMUNITY)
Admission: RE | Admit: 2016-04-28 | Discharge: 2016-04-28 | Disposition: A | Discharge status: Home / Self Care | Payer: 59 | Source: Ambulatory Visit | Attending: Obstetrics and Gynecology | Admitting: Obstetrics and Gynecology

## 2016-04-28 ENCOUNTER — Inpatient Hospital Stay (HOSPITAL_COMMUNITY): Payer: 59 | Admitting: Anesthesiology

## 2016-04-28 DIAGNOSIS — O99214 Obesity complicating childbirth: Secondary | ICD-10-CM | POA: Diagnosis present

## 2016-04-28 DIAGNOSIS — G5600 Carpal tunnel syndrome, unspecified upper limb: Secondary | ICD-10-CM

## 2016-04-28 DIAGNOSIS — Z8249 Family history of ischemic heart disease and other diseases of the circulatory system: Secondary | ICD-10-CM | POA: Diagnosis not present

## 2016-04-28 DIAGNOSIS — Z6834 Body mass index (BMI) 34.0-34.9, adult: Secondary | ICD-10-CM

## 2016-04-28 DIAGNOSIS — F17210 Nicotine dependence, cigarettes, uncomplicated: Secondary | ICD-10-CM | POA: Diagnosis present

## 2016-04-28 DIAGNOSIS — O1002 Pre-existing essential hypertension complicating childbirth: Secondary | ICD-10-CM | POA: Diagnosis not present

## 2016-04-28 DIAGNOSIS — Z833 Family history of diabetes mellitus: Secondary | ICD-10-CM | POA: Diagnosis not present

## 2016-04-28 DIAGNOSIS — E669 Obesity, unspecified: Secondary | ICD-10-CM | POA: Diagnosis present

## 2016-04-28 DIAGNOSIS — Z3A37 37 weeks gestation of pregnancy: Secondary | ICD-10-CM | POA: Diagnosis not present

## 2016-04-28 DIAGNOSIS — O26899 Other specified pregnancy related conditions, unspecified trimester: Secondary | ICD-10-CM

## 2016-04-28 DIAGNOSIS — O99334 Smoking (tobacco) complicating childbirth: Secondary | ICD-10-CM | POA: Diagnosis present

## 2016-04-28 DIAGNOSIS — O0990 Supervision of high risk pregnancy, unspecified, unspecified trimester: Secondary | ICD-10-CM

## 2016-04-28 DIAGNOSIS — O99210 Obesity complicating pregnancy, unspecified trimester: Secondary | ICD-10-CM

## 2016-04-28 DIAGNOSIS — O10919 Unspecified pre-existing hypertension complicating pregnancy, unspecified trimester: Secondary | ICD-10-CM | POA: Diagnosis present

## 2016-04-28 DIAGNOSIS — O10019 Pre-existing essential hypertension complicating pregnancy, unspecified trimester: Secondary | ICD-10-CM

## 2016-04-28 LAB — CBC
HEMATOCRIT: 31.2 % — AB (ref 36.0–46.0)
Hemoglobin: 11.3 g/dL — ABNORMAL LOW (ref 12.0–15.0)
MCH: 30.6 pg (ref 26.0–34.0)
MCHC: 36.2 g/dL — ABNORMAL HIGH (ref 30.0–36.0)
MCV: 84.6 fL (ref 78.0–100.0)
PLATELETS: 188 10*3/uL (ref 150–400)
RBC: 3.69 MIL/uL — AB (ref 3.87–5.11)
RDW: 13.9 % (ref 11.5–15.5)
WBC: 10.5 10*3/uL (ref 4.0–10.5)

## 2016-04-28 LAB — COMPREHENSIVE METABOLIC PANEL
ALT: 17 U/L (ref 14–54)
AST: 20 U/L (ref 15–41)
Albumin: 3.2 g/dL — ABNORMAL LOW (ref 3.5–5.0)
Alkaline Phosphatase: 83 U/L (ref 38–126)
Anion gap: 9 (ref 5–15)
BILIRUBIN TOTAL: 0.8 mg/dL (ref 0.3–1.2)
BUN: 11 mg/dL (ref 6–20)
CHLORIDE: 104 mmol/L (ref 101–111)
CO2: 20 mmol/L — ABNORMAL LOW (ref 22–32)
CREATININE: 0.51 mg/dL (ref 0.44–1.00)
Calcium: 9 mg/dL (ref 8.9–10.3)
Glucose, Bld: 94 mg/dL (ref 65–99)
Potassium: 3.3 mmol/L — ABNORMAL LOW (ref 3.5–5.1)
Sodium: 133 mmol/L — ABNORMAL LOW (ref 135–145)
TOTAL PROTEIN: 6.6 g/dL (ref 6.5–8.1)

## 2016-04-28 LAB — TYPE AND SCREEN
ABO/RH(D): AB POS
Antibody Screen: NEGATIVE

## 2016-04-28 LAB — PROTEIN / CREATININE RATIO, URINE
CREATININE, URINE: 163 mg/dL
Protein Creatinine Ratio: 0.12 mg/mg{Cre} (ref 0.00–0.15)
TOTAL PROTEIN, URINE: 19 mg/dL

## 2016-04-28 LAB — RPR: RPR Ser Ql: NONREACTIVE

## 2016-04-28 MED ORDER — ATENOLOL 50 MG PO TABS
50.0000 mg | ORAL_TABLET | Freq: Every day | ORAL | Status: DC
  Administered 2016-04-29: 50 mg via ORAL
  Filled 2016-04-28 (×3): qty 1

## 2016-04-28 MED ORDER — SIMETHICONE 80 MG PO CHEW: 80.0000 mg | CHEWABLE_TABLET | ORAL | Status: DC | PRN

## 2016-04-28 MED ORDER — PRENATAL MULTIVITAMIN CH
1.0000 | ORAL_TABLET | Freq: Every day | ORAL | Status: DC
  Administered 2016-04-29: 1 via ORAL
  Filled 2016-04-28: qty 1

## 2016-04-28 MED ORDER — LIDOCAINE HCL (PF) 1 % IJ SOLN
30.0000 mL | INTRAMUSCULAR | Status: DC | PRN
  Filled 2016-04-28: qty 30

## 2016-04-28 MED ORDER — TETANUS-DIPHTH-ACELL PERTUSSIS 5-2.5-18.5 LF-MCG/0.5 IM SUSP: 0.5000 mL | Freq: Once | INTRAMUSCULAR | Status: DC

## 2016-04-28 MED ORDER — LACTATED RINGERS IV SOLN: 500.0000 mL | Freq: Once | INTRAVENOUS | Status: DC

## 2016-04-28 MED ORDER — OXYCODONE-ACETAMINOPHEN 5-325 MG PO TABS
1.0000 | ORAL_TABLET | ORAL | Status: DC | PRN
  Administered 2016-04-28: 1 via ORAL
  Filled 2016-04-28: qty 1

## 2016-04-28 MED ORDER — ONDANSETRON HCL 4 MG/2ML IJ SOLN: 4.0000 mg | INTRAMUSCULAR | Status: DC | PRN

## 2016-04-28 MED ORDER — ACETAMINOPHEN 325 MG PO TABS: 650.0000 mg | ORAL_TABLET | ORAL | Status: DC | PRN

## 2016-04-28 MED ORDER — EPHEDRINE 5 MG/ML INJ
10.0000 mg | INTRAVENOUS | Status: DC | PRN
  Filled 2016-04-28: qty 4

## 2016-04-28 MED ORDER — LACTATED RINGERS IV SOLN: 500.0000 mL | INTRAVENOUS | Status: DC | PRN

## 2016-04-28 MED ORDER — MISOPROSTOL 25 MCG QUARTER TABLET
25.0000 ug | ORAL_TABLET | ORAL | Status: DC | PRN
  Administered 2016-04-28 (×2): 25 ug via VAGINAL
  Filled 2016-04-28: qty 1
  Filled 2016-04-28 (×2): qty 0.25

## 2016-04-28 MED ORDER — ACETAMINOPHEN 325 MG PO TABS
650.0000 mg | ORAL_TABLET | ORAL | Status: DC | PRN
  Administered 2016-04-28 (×2): 650 mg via ORAL
  Filled 2016-04-28 (×2): qty 2

## 2016-04-28 MED ORDER — BENZOCAINE-MENTHOL 20-0.5 % EX AERO: 1.0000 "application " | INHALATION_SPRAY | CUTANEOUS | Status: DC | PRN

## 2016-04-28 MED ORDER — DIPHENHYDRAMINE HCL 25 MG PO CAPS: 25.0000 mg | ORAL_CAPSULE | Freq: Four times a day (QID) | ORAL | Status: DC | PRN

## 2016-04-28 MED ORDER — COCONUT OIL OIL: 1.0000 "application " | TOPICAL_OIL | Status: DC | PRN

## 2016-04-28 MED ORDER — OXYCODONE-ACETAMINOPHEN 5-325 MG PO TABS: 2.0000 | ORAL_TABLET | ORAL | Status: DC | PRN

## 2016-04-28 MED ORDER — OXYTOCIN 40 UNITS IN LACTATED RINGERS INFUSION - SIMPLE MED
1.0000 m[IU]/min | INTRAVENOUS | Status: DC
  Administered 2016-04-28: 2 m[IU]/min via INTRAVENOUS
  Filled 2016-04-28 (×2): qty 1000

## 2016-04-28 MED ORDER — SENNOSIDES-DOCUSATE SODIUM 8.6-50 MG PO TABS
2.0000 | ORAL_TABLET | ORAL | Status: DC
  Filled 2016-04-28: qty 2

## 2016-04-28 MED ORDER — ONDANSETRON HCL 4 MG PO TABS: 4.0000 mg | ORAL_TABLET | ORAL | Status: DC | PRN

## 2016-04-28 MED ORDER — TERBUTALINE SULFATE 1 MG/ML IJ SOLN
0.2500 mg | Freq: Once | INTRAMUSCULAR | Status: DC | PRN
  Filled 2016-04-28: qty 1

## 2016-04-28 MED ORDER — PHENYLEPHRINE 40 MCG/ML (10ML) SYRINGE FOR IV PUSH (FOR BLOOD PRESSURE SUPPORT)
80.0000 ug | PREFILLED_SYRINGE | INTRAVENOUS | Status: DC | PRN
  Administered 2016-04-28 (×2): 80 ug via INTRAVENOUS
  Filled 2016-04-28: qty 5

## 2016-04-28 MED ORDER — DIPHENHYDRAMINE HCL 50 MG/ML IJ SOLN: 12.5000 mg | INTRAMUSCULAR | Status: DC | PRN

## 2016-04-28 MED ORDER — ZOLPIDEM TARTRATE 5 MG PO TABS: 5.0000 mg | ORAL_TABLET | Freq: Every evening | ORAL | Status: DC | PRN

## 2016-04-28 MED ORDER — LACTATED RINGERS IV SOLN
INTRAVENOUS | Status: DC
  Administered 2016-04-28: 02:00:00 via INTRAVENOUS

## 2016-04-28 MED ORDER — FENTANYL CITRATE (PF) 100 MCG/2ML IJ SOLN: 100.0000 ug | INTRAMUSCULAR | Status: DC | PRN

## 2016-04-28 MED ORDER — WITCH HAZEL-GLYCERIN EX PADS: 1.0000 "application " | MEDICATED_PAD | CUTANEOUS | Status: DC | PRN

## 2016-04-28 MED ORDER — DIBUCAINE 1 % RE OINT: 1.0000 "application " | TOPICAL_OINTMENT | RECTAL | Status: DC | PRN

## 2016-04-28 MED ORDER — ONDANSETRON HCL 4 MG/2ML IJ SOLN: 4.0000 mg | Freq: Four times a day (QID) | INTRAMUSCULAR | Status: DC | PRN

## 2016-04-28 MED ORDER — ATENOLOL 50 MG PO TABS
50.0000 mg | ORAL_TABLET | Freq: Every day | ORAL | Status: DC
  Filled 2016-04-28 (×2): qty 1

## 2016-04-28 MED ORDER — PHENYLEPHRINE 40 MCG/ML (10ML) SYRINGE FOR IV PUSH (FOR BLOOD PRESSURE SUPPORT)
80.0000 ug | PREFILLED_SYRINGE | INTRAVENOUS | Status: DC | PRN
  Filled 2016-04-28: qty 10
  Filled 2016-04-28: qty 5

## 2016-04-28 MED ORDER — OXYTOCIN BOLUS FROM INFUSION
500.0000 mL | Freq: Once | INTRAVENOUS | Status: AC
  Administered 2016-04-28: 500 mL via INTRAVENOUS

## 2016-04-28 MED ORDER — OXYTOCIN 40 UNITS IN LACTATED RINGERS INFUSION - SIMPLE MED: 2.5000 [IU]/h | INTRAVENOUS | Status: DC

## 2016-04-28 MED ORDER — ZOLPIDEM TARTRATE 5 MG PO TABS
5.0000 mg | ORAL_TABLET | Freq: Every evening | ORAL | Status: DC | PRN
  Administered 2016-04-28: 5 mg via ORAL
  Filled 2016-04-28: qty 1

## 2016-04-28 MED ORDER — FENTANYL 2.5 MCG/ML BUPIVACAINE 1/10 % EPIDURAL INFUSION (WH - ANES)
14.0000 mL/h | INTRAMUSCULAR | Status: DC | PRN
  Administered 2016-04-28: 14 mL/h via EPIDURAL
  Filled 2016-04-28: qty 125

## 2016-04-28 MED ORDER — SOD CITRATE-CITRIC ACID 500-334 MG/5ML PO SOLN: 30.0000 mL | ORAL | Status: DC | PRN

## 2016-04-28 MED ORDER — LIDOCAINE HCL (PF) 1 % IJ SOLN
INTRAMUSCULAR | Status: DC | PRN
  Administered 2016-04-28 (×2): 6 mL

## 2016-04-28 MED ORDER — IBUPROFEN 600 MG PO TABS
600.0000 mg | ORAL_TABLET | Freq: Four times a day (QID) | ORAL | Status: DC
  Administered 2016-04-28 – 2016-04-29 (×4): 600 mg via ORAL
  Filled 2016-04-28 (×4): qty 1

## 2016-04-28 NOTE — H&P (Signed)
[name] is a 35 y.o. female [phone] @ 37.0wks presenting for IOL due to [facility] for which she takes atenolol 50mg . Denies H/A, VD, or RUQ pain. No leaking or bldg. Her preg has been followed by the [facility] and has been remarkable for 1) cHTN 2) obesity 3) AMA 4) hx GDM  OB History    Gravida Para Term Preterm AB Living   7 3 3  0 3 3   SAB TAB Ectopic Multiple Live Births   1 2 0 0 3     Past Medical History:  Diagnosis Date  . Gestational diabetes    with 1st pregnancy  . Hypertension   . Migraine   . Urinary tract infection    Past Surgical History:  Procedure Laterality Date  . CHOLECYSTECTOMY N/A 11/2013  . DILATION AND EVACUATION  03/09/2011   Procedure: DILATATION AND EVACUATION (D&E);  Surgeon: Donnamae Jude, MD;  Location: [facility];  Service: Gynecology;  Laterality: N/A;  . TONSILLECTOMY  1988  . Tubes in ears  Baby   Family History: family history includes Breast cancer (age of onset: 31) in her maternal aunt; Cancer (age of onset: 45) in her sister; Cirrhosis in her father; Diabetes in her mother; Heart disease in her maternal grandmother, mother, and sister; Heart failure in her father; Hepatitis in her father; Hyperlipidemia in her father and mother; Hypertension in her father and mother; Hypothyroidism in her mother; Mental illness in her father, mother, and sister; Migraines in her father, mother, and sister; Osteoarthritis in her mother; Seizures (age of onset: 73) in her sister. Social History:  reports that she has been smoking Cigarettes.  She has a 6.00 pack-year smoking history. She has never used smokeless tobacco. She reports that she drinks about 0.5 oz of alcohol per week . She reports that she does not use drugs.     Maternal Diabetes: No Genetic Screening: Normal Maternal Ultrasounds/Referrals: Normal Fetal Ultrasounds or other Referrals:  None Maternal Substance Abuse:  Yes:  Type: Smoker Significant Maternal Medications:  Meds include:  Other: Tenormin Significant Maternal Lab Results:  Lab values include: Group B Strep negative Other Comments:  maternal CHTN  ROS History   Last menstrual period 07/30/2015, unknown if currently breastfeeding. Exam Physical Exam  Constitutional: She is oriented to person, place, and time. She appears well-developed.  HENT:  Head: Normocephalic.  Neck: Normal range of motion.  Cardiovascular: Normal rate.   Respiratory: Effort normal.  GI:  EFM 120s, +accels, no decels Irreg mild ctx  Musculoskeletal: Normal range of motion.  Neurological: She is alert and oriented to person, place, and time.  Skin: Skin is warm and dry.  Psychiatric: She has a normal mood and affect. Her behavior is normal. Thought content normal.    Prenatal labs: ABO, Rh: AB/POS/-- (03/30 1115) Antibody: NEG (03/30 1115) Rubella: 16.00 (03/30 1115) RPR: NON REAC (09/05 0935)  HBsAg: NEGATIVE (03/30 1115)  HIV: NONREACTIVE (09/05 0935)  GBS:     Assessment/Plan: IUP@37 .0wks  cHTN Unfavorable cx GBS neg  Admit to [place] cx ripening w/ cytotec/FB/Pit prn Continue Tenormin  Anticipate SVD  Idalie Canto CNM 04/28/2016, 12:15 AM

## 2016-04-28 NOTE — Anesthesia Procedure Notes (Signed)
Epidural Patient location during procedure: OB Start time: 04/28/2016 10:23 AM  Staffing Anesthesiologist: Franne Grip  Preanesthetic Checklist Completed: patient identified, site marked, surgical consent, pre-op evaluation, timeout performed, IV checked, risks and benefits discussed and monitors and equipment checked  Epidural Patient position: sitting Prep: DuraPrep Patient monitoring: blood pressure and heart rate Approach: midline Location: L3-L4 Injection technique: LOR saline  Needle:  Needle type: Tuohy  Needle gauge: 17 G Needle length: 9 cm Needle insertion depth: 5 cm Catheter type: closed end flexible Catheter size: 19 Gauge Catheter at skin depth: 13 cm Test dose: negative and Other  Assessment Events: blood not aspirated, injection not painful, no injection resistance, negative IV test and no paresthesia  Additional Notes Reason for block:procedure for pain

## 2016-04-28 NOTE — Anesthesia Preprocedure Evaluation (Signed)
Anesthesia Evaluation  Patient identified by MRN, date of birth, ID band Patient awake  General Assessment Comment:Past Medical History: Diagnosis Date . Gestational diabetes   with 1st pregnancy . Hypertension  . Migraine  . Urinary tract infection     Reviewed: Allergy & Precautions, NPO status , Patient's Chart, lab work & pertinent test results  Airway Mallampati: II  TM Distance: >3 FB Neck ROM: Full    Dental no notable dental hx.    Pulmonary neg pulmonary ROS, Current Smoker,    Pulmonary exam normal breath sounds clear to auscultation       Cardiovascular Exercise Tolerance: Good hypertension, Pt. on home beta blockers and Pt. on medications Normal cardiovascular exam Rhythm:Regular Rate:Normal     Neuro/Psych  Headaches,  Neuromuscular disease negative psych ROS   GI/Hepatic negative GI ROS, Neg liver ROS,   Endo/Other  negative endocrine ROSdiabetes  Renal/GU negative Renal ROS  negative genitourinary   Musculoskeletal negative musculoskeletal ROS (+)   Abdominal (+) + obese,   Peds negative pediatric ROS (+)  Hematology negative hematology ROS (+)   Anesthesia Other Findings   Reproductive/Obstetrics negative OB ROS                             Anesthesia Physical Anesthesia Plan  ASA: III  Anesthesia Plan: Epidural   Post-op Pain Management:    Induction:   Airway Management Planned: Natural Airway  Additional Equipment:   Intra-op Plan:   Post-operative Plan:   Informed Consent: I have reviewed the patients History and Physical, chart, labs and discussed the procedure including the risks, benefits and alternatives for the proposed anesthesia with the patient or authorized representative who has indicated his/her understanding and acceptance.     Plan Discussed with: CRNA  Anesthesia Plan Comments: (Informed consent obtained prior to proceeding  including risk of failure, 1% risk of PDPH, risk of minor discomfort and bruising.  Discussed rare but serious complications including epidural abscess, permanent nerve injury, epidural hematoma.  Discussed alternatives to epidural analgesia and patient desires to proceed.  Timeout performed pre-procedure verifying patient name, procedure, and platelet count.  Patient tolerated procedure well.  Chronic HTN on home Atenolol Not currently thought to have preeclampsia Platelet count 188K)        Anesthesia Quick Evaluation

## 2016-04-28 NOTE — Lactation Note (Signed)
This note was copied from a baby's chart. Lactation Consultation Note Initial visit at 6 hours of age, baby is 48w0s at 6#8oz.  Mom reports limited experience with 3 older children breastfeeding and mom is concerned due to previous low milk supply.  MOm is holding baby STS after bath. LC discussed post pumping with DEBP and then working on hand expression and offering EBM to baby.  LC will have RN set up DEBP.  [facility] LC resources given and discussed.  Encouraged to feed with early cues on demand.  Early newborn behavior discussed.  Hand expression demonstrated with colostrum visible.  Mom encouraged to work on hand expression prior to latching baby.  Mom to call for assist as needed.    Patient Name: [name] [id number] Date: 04/28/2016 Reason for consult: Initial assessment   Maternal Data Has patient been taught Hand Expression?: Yes Does the patient have breastfeeding experience prior to this delivery?: Yes  Feeding Feeding Type: Breast Fed Length of feed: 10 min  LATCH Score/Interventions                      Lactation Tools Discussed/Used Date initiated:: 04/29/16   Consult Status Consult Status: Follow-up Date: 04/29/16 Follow-up type: In-patient    Gisell Buehrle, Justine Null 04/28/2016, 11:01 PM

## 2016-04-28 NOTE — Progress Notes (Signed)
Patient ID: [name], female   DOB: 11/07/1980, 35 y.o.   MRN: [id number]  Feeling a little crampy after cytotec x 2 doses  BPs 128/74, other VSS FHR 120s, +accels, no decels Ctx irreg Cx 1-2/60/-2  IUP@term  cHTN on atenolol Unfavorable cx  Foley bulb placed without difficulty  Serita Grammes CNM 04/28/2016 8:51 AM

## 2016-04-28 NOTE — Anesthesia Pain Management Evaluation Note (Signed)
  CRNA Pain Management Visit Note  Patient: [name], 35 y.o., female  "Hello I am a member of the anesthesia team at [facility]. We have an anesthesia team available at all times to provide care throughout the hospital, including epidural management and anesthesia for C-section. I don't know your plan for the delivery whether it a natural birth, water birth, IV sedation, nitrous supplementation, doula or epidural, but we want to meet your pain goals."   1.Was your pain managed to your expectations on prior hospitalizations?   Yes   2.What is your expectation for pain management during this hospitalization?     Epidural and IV pain meds  3.How can we help you reach that goal? Be available  Record the patient's initial score and the patient's pain goal.   Pain: 3  Pain Goal: 5 [facility] wants you to be able to say your pain was always managed very well.  [facility] 04/28/2016

## 2016-04-28 NOTE — Progress Notes (Addendum)
Dr. Baron Sane notified of maternal blood pressure drop after epidural placement. Per MD hold Atenolol until tonight.

## 2016-04-29 ENCOUNTER — Encounter (HOSPITAL_COMMUNITY)
Admission: AD | Disposition: A | Discharge status: Home / Self Care | Payer: Self-pay | Source: Ambulatory Visit | Attending: Obstetrics and Gynecology

## 2016-04-29 ENCOUNTER — Encounter (HOSPITAL_COMMUNITY): Payer: Self-pay | Admitting: Anesthesiology

## 2016-04-29 SURGERY — LIGATION, FALLOPIAN TUBE, POSTPARTUM
Anesthesia: Choice | Laterality: Bilateral

## 2016-04-29 MED ORDER — SENNOSIDES-DOCUSATE SODIUM 8.6-50 MG PO TABS: 2.0000 | ORAL_TABLET | ORAL | 0 refills | Status: DC

## 2016-04-29 MED ORDER — IBUPROFEN 600 MG PO TABS: 600.0000 mg | ORAL_TABLET | Freq: Four times a day (QID) | ORAL | 0 refills | Status: DC

## 2016-04-29 NOTE — Lactation Note (Signed)
This note was copied from a baby's chart. Lactation Consultation Note  Patient Name: [name] [id number] Date: 04/29/2016 Reason for consult: Follow-up assessment Baby at 20 hr of life. Upon entry baby was cueing. FOB tried to wake mom but she told him to feed her expressed milk she was too tired to latch baby. FOB requested help with spoon feeding. Demonstrated spoon feeding with 1 ml of mom's pumped milk. Baby tolerated spoon feeding well. FOB stated that mom really wants to bf for at least the first month. She struggled with her other children. He reports mom having sore nipples. RN stated baby bites down when sucking a gloved finger and she did suck training with baby. With oral assessment: baby has a thick tight upper labial frenulum with a notched insertion point at the bottom of the gum ridge. Baby can extend tongue over gum ridge, lift tongue to roof, has some lateralization of tongue, and nice peristolic tongue movement at times. He does tend to keep tongue bunched in the back of the mouth and does do a lot of biting down, tongue quivering on finger after suck training. He has a short, high palate. Its almost like he has a small/squished mouth cavity. FOB is worried that baby is not getting enough. Discussed LPT feeding policy. He will have mom call if baby continues to cue or when she wakes for the next feeding (whick ever happens first). Lactation float phone on the white board.   Maternal Data    Feeding Feeding Type: Breast Milk  LATCH Score/Interventions                      Lactation Tools Discussed/Used Breast pump type: Double-Electric Breast Pump   Consult Status Consult Status: Follow-up Date: 04/29/16 Follow-up type: In-patient    [name] 04/29/2016, 12:14 PM

## 2016-04-29 NOTE — Discharge Summary (Signed)
OB Discharge Summary     Patient Name: [name] DOB: 16-Aug-1980 MRN: [id number]  Date of admission: 04/28/2016 Delivering MD: [place], [place]   Date of discharge: 04/29/2016  Admitting diagnosis: induction Desires sterilization Intrauterine pregnancy: [redacted]w[redacted]d     Secondary diagnosis:  Active Problems:   Chronic hypertension in pregnancy  Additional problems: none     Discharge diagnosis: Term Pregnancy Delivered                                                                                                Post partum procedures:none  Augmentation: Cytotec and Foley Balloon  Complications: None  Hospital course:  Induction of Labor With Vaginal Delivery   35 y.o. yo [id number] at [redacted]w[redacted]d was admitted to the hospital 04/28/2016 for induction of labor.  Indication for induction: chronic hypertension.  Patient had an uncomplicated labor course as follows: Membrane Rupture Time/Date: 11:07 AM ,04/28/2016   Intrapartum Procedures: Episiotomy: None [1]                                         Lacerations:  None [1]  Patient had delivery of a Viable infant.  Information for the patient's newborn:  [name], Likes [id number]  Delivery Method: Vaginal, Spontaneous Delivery (Filed from Delivery Summary)   04/28/2016  Details of delivery can be found in separate delivery note.  Patient had a routine postpartum course. Patient is discharged home 04/29/16.   Physical exam Vitals:   04/28/16 1815 04/28/16 2000 04/29/16 0100 04/29/16 0528  BP: 130/72 125/67 114/63 131/76  Pulse: 99 93 78 67  Resp: 18 20 18 18   Temp: 98.8 F (37.1 C) 99.2 F (37.3 C) 98 F (36.7 C) 98.5 F (36.9 C)  TempSrc: Oral Oral Oral Oral  SpO2: 98%     Weight:      Height:       General: alert, cooperative and no distress Lochia: appropriate Uterine Fundus: firm Incision: N/A DVT Evaluation: No evidence of DVT seen on physical exam. Labs: Lab Results  Component Value Date   WBC  10.5 04/28/2016   HGB 11.3 (L) 04/28/2016   HCT 31.2 (L) 04/28/2016   MCV 84.6 04/28/2016   PLT 188 04/28/2016   CMP Latest Ref Rng & Units 04/28/2016  Glucose 65 - 99 mg/dL 94  BUN 6 - 20 mg/dL 11  Creatinine 0.44 - 1.00 mg/dL 0.51  Sodium 135 - 145 mmol/L 133(L)  Potassium 3.5 - 5.1 mmol/L 3.3(L)  Chloride 101 - 111 mmol/L 104  CO2 22 - 32 mmol/L 20(L)  Calcium 8.9 - 10.3 mg/dL 9.0  Total Protein 6.5 - 8.1 g/dL 6.6  Total Bilirubin 0.3 - 1.2 mg/dL 0.8  Alkaline Phos 38 - 126 U/L 83  AST 15 - 41 U/L 20  ALT 14 - 54 U/L 17    Discharge instruction: per After Visit Summary and "Baby and Me Booklet".  After visit meds:    Medication List    TAKE these medications  acetaminophen 500 MG tablet Commonly known as:  TYLENOL Take 1,000 mg by mouth every 6 (six) hours as needed for pain.   atenolol 50 MG tablet Commonly known as:  TENORMIN TAKE 1 TABLET BY MOUTH EVERY DAY   ferrous sulfate 325 (65 FE) MG tablet Commonly known as:  FERROUSUL Take 1 tablet (325 mg total) by mouth daily with breakfast. 30 minutes BEFORE food, with OJ or vitamin C.   flintstones complete 60 MG chewable tablet Chew 2 tablets by mouth daily.   ibuprofen 600 MG tablet Commonly known as:  ADVIL,MOTRIN Take 1 tablet (600 mg total) by mouth every 6 (six) hours.   senna-docusate 8.6-50 MG tablet Commonly known as:  Senokot-S Take 2 tablets by mouth daily. Start taking on:  04/30/2016       Diet: routine diet  Activity: Advance as tolerated. Pelvic rest for 6 weeks.   Outpatient follow up:6 weeks Follow up Appt:Future Appointments Date Time Provider [place]  06/05/2016 10:00 AM Donnamae Jude, MD [facility]   Follow up Visit:No Follow-up on file.  Postpartum contraception: Vasectomy and Undecided  Newborn Data: Live born female  Birth Weight: 6 lb 8.2 oz (2955 g) APGAR: 9, 9  Baby Feeding: Bottle and Breast Disposition:home with mother   04/29/2016 Steve Rattler, DO    OB FELLOW DISCHARGE ATTESTATION  I have seen and examined this patient and agree with above documentation in the resident's note.   [name], MD 7:40 AM

## 2016-04-29 NOTE — Progress Notes (Signed)
Asked patient if she was planning on having a tubal ligation in morning and she stated she had thought about but decided she did not want to.

## 2016-04-29 NOTE — Discharge Instructions (Signed)

## 2016-04-29 NOTE — Lactation Note (Signed)
This note was copied from a baby's chart. Lactation Consultation Note  Patient Name: [name] [id number] Date: 04/29/2016 Reason for consult: Follow-up assessment Baby at 21 hr of life. Baby is sleepy at the breast and tends to keep both lips tucked in. He can flange the bottom lip better than the top. Applied #20 NS ad preloaded it with Alimentum. Mom reported latching with the NS felt much better. Baby was able to maintain longer burst of sucking. Baby took 4 ml of formula with the NS. When baby came off the breast the final time colostrum was noted in the NS. Mom's breast are starting to feel firm in places. Moved her up to the 41mm flanges. She will post pump then manually express after every bf. She will offer her milk per LPT infant guidelines and supplement with formula as needed. She is ok with using the NS and spoon for offering her milk or the formula. She will feed baby q3hr or before if baby is cueing. Reviewed nipple care and breast changes. She is aware of lactation services and support group. She has lactation phone on the white board if she is having a hard latching baby or keeping baby awake at the breast.   Maternal Data    Feeding Feeding Type: Breast Fed Length of feed: 15 min  LATCH Score/Interventions Latch: Repeated attempts needed to sustain latch, nipple held in mouth throughout feeding, stimulation needed to elicit sucking reflex. Intervention(s): Waking techniques Intervention(s): Assist with latch;Breast massage;Breast compression  Audible Swallowing: A few with stimulation Intervention(s): Hand expression;Skin to skin;Alternate breast massage  Type of Nipple: Everted at rest and after stimulation  Comfort (Breast/Nipple): Filling, red/small blisters or bruises, mild/mod discomfort  Problem noted: Mild/Moderate discomfort;Cracked, bleeding, blisters, bruises Interventions  (Cracked/bleeding/bruising/blister): Expressed breast milk to  nipple Interventions (Mild/moderate discomfort): Post-pump  Hold (Positioning): Assistance needed to correctly position infant at breast and maintain latch. Intervention(s): Position options;Support Pillows  LATCH Score: 6  Lactation Tools Discussed/Used Tools: Nipple Shields Nipple shield size: 20 Breast pump type: Double-Electric Breast Pump   Consult Status Consult Status: Follow-up Date: 04/30/16 Follow-up type: In-patient    Denzil Hughes 04/29/2016, 1:18 PM

## 2016-04-29 NOTE — Anesthesia Postprocedure Evaluation (Signed)
Anesthesia Post Note  Patient: [name]  Procedure(s) Performed: * No procedures listed *  Patient location during evaluation: Mother Baby Anesthesia Type: Epidural Level of consciousness: awake and alert Pain management: pain level controlled Vital Signs Assessment: post-procedure vital signs reviewed and stable Respiratory status: spontaneous breathing, nonlabored ventilation and respiratory function stable Cardiovascular status: stable Postop Assessment: no headache, no backache, epidural receding and patient able to bend at knees Anesthetic complications: no     Last Vitals:  Vitals:   04/29/16 0100 04/29/16 0528  BP: 114/63 131/76  Pulse: 78 67  Resp: 18 18  Temp: 36.7 C 36.9 C    Last Pain:  Vitals:   04/29/16 0553  TempSrc:   PainSc: 6    Pain Goal: Patients Stated Pain Goal: 2 (04/28/16 2000)               Rayvon Char

## 2016-04-30 ENCOUNTER — Ambulatory Visit: Payer: Self-pay

## 2016-04-30 NOTE — Lactation Note (Signed)
This note was copied from a baby's chart. Lactation Consultation Note Mom called for latching assistance. Mom using NS d/t painful latching. Mom states baby is biting. Baby sleeping soundly. Noted significant under bite.  Noted nipples bruised and sore. Rt. Nipple is larger than Lt. Rt. Breast is a cup size larger than Lt. Breast tender to touch. Noted heavy pendulum breast w/knots. Encouraged to massage knots, mom stated painful. Educated on filling and engorgement and prevention. Hand expressed 6 ml. Mom tired. Encouraged to lay flat w/hand over head and occasionally massage breast, rest and nap until next feeding. Noted significant under bite while baby was sleeping. Encouraged mom to do chin tug when latching.  Prior to expressing colostrum, mom stated she wasn't getting enough for baby she might have to give formula. Mom pleased to see colostrum, felt better about BF and baby satisfaction.  Patient Name: [name] [id number] Date: 04/30/2016 Reason for consult: Follow-up assessment;Difficult latch;Breast/nipple pain;Late preterm infant   Maternal Data    Feeding Feeding Type: Breast Fed Length of feed: 20 min  LATCH Score/Interventions Latch: Repeated attempts needed to sustain latch, nipple held in mouth throughout feeding, stimulation needed to elicit sucking reflex. Intervention(s): Breast massage;Breast compression  Audible Swallowing: A few with stimulation Intervention(s): Hand expression;Alternate breast massage  Type of Nipple: Everted at rest and after stimulation  Comfort (Breast/Nipple): Engorged, cracked, bleeding, large blisters, severe discomfort Problem noted: Cracked, bleeding, blisters, bruises Intervention(s): Double electric pump;Expressed breast milk to nipple  Problem noted: Mild/Moderate discomfort;Filling Interventions (Filling): Massage;Frequent nursing;Double electric pump Interventions (Mild/moderate discomfort): Post-pump;Hand expression;Breast  shields  Hold (Positioning): Assistance needed to correctly position infant at breast and maintain latch. Intervention(s): Breastfeeding basics reviewed;Support Pillows;Position options;Skin to skin  LATCH Score: 7  Lactation Tools Discussed/Used Tools: Nipple [place] Fuel;Pump Nipple shield size: 24 Breast pump type: Double-Electric Breast Pump   Consult Status Consult Status: Follow-up Date: 04/30/16 Follow-up type: In-patient    Rosaly Labarbera, Elta Guadeloupe 04/30/2016, 4:31 AM

## 2016-04-30 NOTE — Lactation Note (Signed)
This note was copied from a baby's chart. Lactation Consultation Note  Baby is cueing to feed but fell asleep when put to the breast.  Mom reports his last feeding was at 0800.  Reviewed late preterm policy with parents and had mom pump. She expressed 15 ml. Breasts are filling and are tender. Taught mom therapeutic breast massage of lactation and breasts became softer and a bit more comfortable. Plan is to follow LPI protocol and to follow-up as an OP.  Patient Name: [name] [id number] Date: 04/30/2016 Reason for consult: Follow-up assessment   Maternal Data    Feeding Feeding Type: Breast Fed Length of feed: 0 min  LATCH Score/Interventions                      Lactation Tools Discussed/Used     Consult Status Consult Status: Follow-up Follow-up type: Out-patient    Van Clines 04/30/2016, 10:21 AM

## 2016-05-01 ENCOUNTER — Encounter: Payer: Self-pay | Admitting: *Deleted

## 2016-05-01 ENCOUNTER — Encounter: Payer: 59 | Admitting: Obstetrics & Gynecology

## 2016-05-01 NOTE — Progress Notes (Signed)
Opened in error

## 2016-05-02 ENCOUNTER — Encounter (HOSPITAL_COMMUNITY): Payer: Self-pay | Admitting: *Deleted

## 2016-05-02 ENCOUNTER — Inpatient Hospital Stay (HOSPITAL_COMMUNITY)
Admission: AD | Admit: 2016-05-02 | Discharge: 2016-05-03 | Disposition: A | Discharge status: Home / Self Care | Payer: 59 | Source: Ambulatory Visit | Attending: Obstetrics & Gynecology | Admitting: Obstetrics & Gynecology

## 2016-05-02 DIAGNOSIS — F17210 Nicotine dependence, cigarettes, uncomplicated: Secondary | ICD-10-CM | POA: Insufficient documentation

## 2016-05-02 DIAGNOSIS — O99335 Smoking (tobacco) complicating the puerperium: Secondary | ICD-10-CM | POA: Insufficient documentation

## 2016-05-02 DIAGNOSIS — O9089 Other complications of the puerperium, not elsewhere classified: Secondary | ICD-10-CM

## 2016-05-02 DIAGNOSIS — O1003 Pre-existing essential hypertension complicating the puerperium: Secondary | ICD-10-CM | POA: Insufficient documentation

## 2016-05-02 DIAGNOSIS — R519 Headache, unspecified: Secondary | ICD-10-CM

## 2016-05-02 DIAGNOSIS — I10 Essential (primary) hypertension: Secondary | ICD-10-CM

## 2016-05-02 DIAGNOSIS — R51 Headache: Secondary | ICD-10-CM

## 2016-05-02 NOTE — MAU Note (Signed)
Pt s/p vaginal delivery 10/28, has had a headache since delivery and has noticed increased swelling.

## 2016-05-03 ENCOUNTER — Encounter: Payer: Self-pay | Admitting: *Deleted

## 2016-05-03 DIAGNOSIS — O9089 Other complications of the puerperium, not elsewhere classified: Secondary | ICD-10-CM

## 2016-05-03 DIAGNOSIS — O1003 Pre-existing essential hypertension complicating the puerperium: Secondary | ICD-10-CM | POA: Diagnosis not present

## 2016-05-03 DIAGNOSIS — R51 Headache: Secondary | ICD-10-CM

## 2016-05-03 DIAGNOSIS — F17210 Nicotine dependence, cigarettes, uncomplicated: Secondary | ICD-10-CM | POA: Diagnosis not present

## 2016-05-03 DIAGNOSIS — O99335 Smoking (tobacco) complicating the puerperium: Secondary | ICD-10-CM | POA: Diagnosis not present

## 2016-05-03 LAB — CBC
HEMATOCRIT: 29.8 % — AB (ref 36.0–46.0)
Hemoglobin: 10.5 g/dL — ABNORMAL LOW (ref 12.0–15.0)
MCH: 30.2 pg (ref 26.0–34.0)
MCHC: 35.2 g/dL (ref 30.0–36.0)
MCV: 85.6 fL (ref 78.0–100.0)
Platelets: 236 10*3/uL (ref 150–400)
RBC: 3.48 MIL/uL — ABNORMAL LOW (ref 3.87–5.11)
RDW: 13.6 % (ref 11.5–15.5)
WBC: 7.9 10*3/uL (ref 4.0–10.5)

## 2016-05-03 LAB — PROTEIN / CREATININE RATIO, URINE: CREATININE, URINE: 50 mg/dL

## 2016-05-03 LAB — COMPREHENSIVE METABOLIC PANEL
ALBUMIN: 3 g/dL — AB (ref 3.5–5.0)
ALT: 27 U/L (ref 14–54)
ANION GAP: 8 (ref 5–15)
AST: 21 U/L (ref 15–41)
Alkaline Phosphatase: 78 U/L (ref 38–126)
BILIRUBIN TOTAL: 0.3 mg/dL (ref 0.3–1.2)
BUN: 13 mg/dL (ref 6–20)
CALCIUM: 9.1 mg/dL (ref 8.9–10.3)
CO2: 22 mmol/L (ref 22–32)
Chloride: 109 mmol/L (ref 101–111)
Creatinine, Ser: 0.48 mg/dL (ref 0.44–1.00)
GLUCOSE: 84 mg/dL (ref 65–99)
POTASSIUM: 3.1 mmol/L — AB (ref 3.5–5.1)
Sodium: 139 mmol/L (ref 135–145)
TOTAL PROTEIN: 6.4 g/dL — AB (ref 6.5–8.1)

## 2016-05-03 LAB — URINALYSIS, ROUTINE W REFLEX MICROSCOPIC
Bilirubin Urine: NEGATIVE
Glucose, UA: NEGATIVE mg/dL
Hgb urine dipstick: NEGATIVE
KETONES UR: NEGATIVE mg/dL
LEUKOCYTES UA: NEGATIVE
NITRITE: NEGATIVE
PROTEIN: NEGATIVE mg/dL
Specific Gravity, Urine: <1.005 — ABNORMAL LOW (ref 1.005–1.030)
pH: 6 (ref 5.0–8.0)

## 2016-05-03 MED ORDER — BUTALBITAL-APAP-CAFFEINE 50-325-40 MG PO TABS: 1.0000 | ORAL_TABLET | Freq: Four times a day (QID) | ORAL | 0 refills | Status: DC | PRN

## 2016-05-03 MED ORDER — BUTALBITAL-APAP-CAFFEINE 50-325-40 MG PO TABS
2.0000 | ORAL_TABLET | Freq: Once | ORAL | Status: AC
  Administered 2016-05-03: 2 via ORAL
  Filled 2016-05-03: qty 2

## 2016-05-03 NOTE — Discharge Instructions (Signed)
Spinal Headache A spinal headache is a severe headache that can happen after getting a spinal tap, also called lumbar puncture, or an epidural anesthetic. Both of these procedures involve passing a needle through ligaments that run along the back side of your spinal column and into one of the spaces just above your spinal cord. Sometimes spinal fluid leaks through the temporary hole left by the needle. This leak causes a decrease in spinal fluid pressure, which leads to a spinal headache. The headache usually begins within hours or 1-2 days after the procedure, and it lasts until adequate pressure returns as your body creates more spinal fluid. The headache can last a few days and rarely lasts for more than 1 week. SIGNS AND SYMPTOMS   Severe headache pain when sitting or standing.  Decreased headache pain when lying down.  Neck pain, especially when flexing the neck in a chin-to-chest position.  Vomiting. DIAGNOSIS  Diagnosis of spinal headache is usually made based on your recent medical history. Your health care provider will consider the timing of a recent spinal tap or epidural anesthetic, along with how soon your headache occurred afterward. On rare occasions, tests may be done to confirm the diagnosis, such as an MRI. TREATMENT  Treatment may include:  Drinking extra fluids to improve your level of hydration. This will help your body replace the spinal fluid that has leaked out through the needle hole. Receiving IV fluids may be necessary.  Taking pain medicine as prescribed by your health care provider.  Drinking caffeinated beverages such as soda, coffee, or tea. Caffeine may help to shrink the blood vessels in your brain, which may reduce your headache pain.  Lying flat for a few days.  Having a blood patch procedure, which involves injecting a small amount of your blood at the puncture site to seal the leak. HOME CARE INSTRUCTIONS  Lie down to relieve pain if your pain gets  worse when you sit or stand.  Drink enough fluids to keep your urine clear or pale yellow.  Take pain medicine as directed by your health care provider. SEEK IMMEDIATE MEDICAL CARE IF:   Your pain becomes very severe or cannot be controlled.  You develop a fever.  You have a stiff neck.  You lose bowel or bladder control.  You have trouble walking. MAKE SURE YOU:  Understand these instructions.  Will watch your condition.   Will get help right away if you are not doing well or get worse.   This information is not intended to replace advice given to you by your health care provider. Make sure you discuss any questions you have with your health care provider.   Document Released: 12/08/2001 Document Revised: 06/23/2013 Document Reviewed: 01/08/2013 Elsevier Interactive Patient Education [employer].

## 2016-05-03 NOTE — MAU Note (Signed)
Pt had a headache since she was in the hospital for IOL. Pt takes 600mg  of motrin @ home with no relief. Pt states that she is swollen in her face and legs.

## 2016-05-03 NOTE — MAU Provider Note (Signed)
History     CSN: [id number]  Arrival date and time: 05/02/16 2323   First Provider Initiated Contact with Patient 05/03/16 0003      Chief Complaint  Patient presents with  . Headache  . Edema   Headache   This is a new problem. Episode onset: 04/28/16  The problem occurs constantly. The problem has been gradually worsening. The pain is located in the occipital, parietal and frontal region. The pain quality is similar to prior headaches (has had a lot of headaches during this pregnancy). The quality of the pain is described as sharp and shooting. The pain is at a severity of 6/10. Associated symptoms include nausea. Pertinent negatives include no abdominal pain, blurred vision, fever or vomiting. Nothing aggravates the symptoms. She has tried NSAIDs for the symptoms. The treatment provided no relief.    Past Medical History:  Diagnosis Date  . Gestational diabetes    with 1st pregnancy  . Hypertension   . Migraine   . Urinary tract infection     Past Surgical History:  Procedure Laterality Date  . CHOLECYSTECTOMY N/A 11/2013  . DILATION AND EVACUATION  03/09/2011   Procedure: DILATATION AND EVACUATION (D&E);  Surgeon: Donnamae Jude, MD;  Location: [facility];  Service: Gynecology;  Laterality: N/A;  . TONSILLECTOMY  1988  . Tubes in ears  Baby    Family History  Problem Relation Age of Onset  . Hypertension Father   . Heart failure Father   . Hepatitis Father   . Cirrhosis Father   . Hyperlipidemia Father   . Migraines Father   . Mental illness Father     Depression  . Diabetes Mother   . Osteoarthritis Mother   . Hypertension Mother   . Hypothyroidism Mother   . Heart disease Mother   . Hyperlipidemia Mother   . Migraines Mother   . Mental illness Mother     Depression  . Breast cancer Maternal Aunt 70  . Cancer Sister 23    Cervical  . Heart disease Sister     Heart Stops  . Migraines Sister   . Seizures Sister 11    unsure if this or heart problem  .  Mental illness Sister     depression  . Heart disease Maternal Grandmother     Social History  Substance Use Topics  . Smoking status: Current Some Day Smoker    Packs/day: 0.50    Years: 12.00    Types: Cigarettes  . Smokeless tobacco: Never Used  . Alcohol use 0.5 oz/week    1 Standard drinks or equivalent per week     Comment: prior to pregnancy    Allergies: No Known Allergies  Prescriptions Prior to Admission  Medication Sig Dispense Refill Last Dose  . acetaminophen (TYLENOL) 500 MG tablet Take 1,000 mg by mouth every 6 (six) hours as needed for pain.   Past Month at Unknown time  . atenolol (TENORMIN) 50 MG tablet TAKE 1 TABLET BY MOUTH EVERY DAY 30 tablet 3 05/02/2016 at Unknown time  . ferrous sulfate (FERROUSUL) 325 (65 FE) MG tablet Take 1 tablet (325 mg total) by mouth daily with breakfast. 30 minutes BEFORE food, with OJ or vitamin C. 90 tablet 3 Past Week at Unknown time  . flintstones complete (FLINTSTONES) 60 MG chewable tablet Chew 2 tablets by mouth daily.   05/02/2016 at Unknown time  . ibuprofen (ADVIL,MOTRIN) 600 MG tablet Take 1 tablet (600 mg total) by mouth every 6 (  six) hours. 30 tablet 0 05/02/2016 at Unknown time  . senna-docusate (SENOKOT-S) 8.6-50 MG tablet Take 2 tablets by mouth daily. 30 tablet 0     Review of Systems  Constitutional: Negative for chills and fever.  Eyes: Negative for blurred vision.  Respiratory: Positive for sputum production. Negative for shortness of breath.   Cardiovascular: Positive for leg swelling.  Gastrointestinal: Positive for nausea. Negative for abdominal pain and vomiting.  Neurological: Positive for headaches.   Physical Exam   Blood pressure 140/88, pulse 64, resp. rate 16, height 5\' 4"  (1.626 m), weight 192 lb (87.1 kg), last menstrual period 07/30/2015, SpO2 100 %, unknown if currently breastfeeding.  Physical Exam  MAU Course  Procedures  MDM 0210: D/w Dr. Elonda Husky, ok for DC home. Could be spinal headache.  Will send home with fioricet and increase caffeine at home.   Assessment and Plan   1. Postpartum headache   2. Chronic hypertension    DC home Comfort measures reviewed  Pre-eclampsia warning signs Spinal headache comfort measures  RX: Fioricet prn Return to MAU as needed FU with OB as planned  [place] [facility] [employer] at [facility] .   Specialty:  Obstetrics and Gynecology Contact information: [place] [phone]           Mathis Bud 05/03/2016, 12:06 AM

## 2016-05-04 ENCOUNTER — Other Ambulatory Visit: Payer: 59

## 2016-05-05 ENCOUNTER — Inpatient Hospital Stay (HOSPITAL_COMMUNITY)
Admission: AD | Admit: 2016-05-05 | Discharge: 2016-05-05 | Disposition: A | Discharge status: Home / Self Care | Payer: 59 | Source: Ambulatory Visit | Attending: Family Medicine | Admitting: Family Medicine

## 2016-05-05 ENCOUNTER — Encounter (HOSPITAL_COMMUNITY): Payer: Self-pay | Admitting: *Deleted

## 2016-05-05 ENCOUNTER — Encounter (HOSPITAL_COMMUNITY): Payer: Self-pay | Admitting: Anesthesiology

## 2016-05-05 ENCOUNTER — Ambulatory Visit (HOSPITAL_COMMUNITY): Payer: 59

## 2016-05-05 DIAGNOSIS — O894 Spinal and epidural anesthesia-induced headache during the puerperium: Secondary | ICD-10-CM

## 2016-05-05 DIAGNOSIS — G971 Other reaction to spinal and lumbar puncture: Secondary | ICD-10-CM | POA: Insufficient documentation

## 2016-05-05 DIAGNOSIS — R6883 Chills (without fever): Secondary | ICD-10-CM | POA: Insufficient documentation

## 2016-05-05 DIAGNOSIS — M542 Cervicalgia: Secondary | ICD-10-CM | POA: Insufficient documentation

## 2016-05-05 DIAGNOSIS — F17210 Nicotine dependence, cigarettes, uncomplicated: Secondary | ICD-10-CM | POA: Insufficient documentation

## 2016-05-05 DIAGNOSIS — D72829 Elevated white blood cell count, unspecified: Secondary | ICD-10-CM | POA: Insufficient documentation

## 2016-05-05 DIAGNOSIS — R51 Headache: Secondary | ICD-10-CM | POA: Insufficient documentation

## 2016-05-05 LAB — COMPREHENSIVE METABOLIC PANEL
ALK PHOS: 77 U/L (ref 38–126)
ALT: 19 U/L (ref 14–54)
AST: 13 U/L — AB (ref 15–41)
Albumin: 3.6 g/dL (ref 3.5–5.0)
Anion gap: 8 (ref 5–15)
BUN: 15 mg/dL (ref 6–20)
CALCIUM: 9.6 mg/dL (ref 8.9–10.3)
CHLORIDE: 108 mmol/L (ref 101–111)
CO2: 24 mmol/L (ref 22–32)
CREATININE: 1.05 mg/dL — AB (ref 0.44–1.00)
GFR calc non Af Amer: >60 mL/min (ref >60)
Glucose, Bld: 89 mg/dL (ref 65–99)
Potassium: 3.6 mmol/L (ref 3.5–5.1)
SODIUM: 140 mmol/L (ref 135–145)
Total Bilirubin: 0.3 mg/dL (ref 0.3–1.2)
Total Protein: 7 g/dL (ref 6.5–8.1)

## 2016-05-05 LAB — PROTEIN / CREATININE RATIO, URINE
Creatinine, Urine: 64 mg/dL
Protein Creatinine Ratio: 0.11 mg/mg{Cre} (ref 0.00–0.15)
TOTAL PROTEIN, URINE: 7 mg/dL

## 2016-05-05 LAB — URINALYSIS, ROUTINE W REFLEX MICROSCOPIC
Bilirubin Urine: NEGATIVE
GLUCOSE, UA: NEGATIVE mg/dL
KETONES UR: NEGATIVE mg/dL
NITRITE: NEGATIVE
PH: 6 (ref 5.0–8.0)
Protein, ur: NEGATIVE mg/dL
SPECIFIC GRAVITY, URINE: 1.01 (ref 1.005–1.030)

## 2016-05-05 LAB — URINE MICROSCOPIC-ADD ON: BACTERIA UA: NONE SEEN

## 2016-05-05 LAB — CBC
HCT: 33.4 % — ABNORMAL LOW (ref 36.0–46.0)
HEMOGLOBIN: 11.8 g/dL — AB (ref 12.0–15.0)
MCH: 30.5 pg (ref 26.0–34.0)
MCHC: 35.3 g/dL (ref 30.0–36.0)
MCV: 86.3 fL (ref 78.0–100.0)
PLATELETS: 280 10*3/uL (ref 150–400)
RBC: 3.87 MIL/uL (ref 3.87–5.11)
RDW: 13.2 % (ref 11.5–15.5)
WBC: 11.5 10*3/uL — AB (ref 4.0–10.5)

## 2016-05-05 MED ORDER — MIDAZOLAM HCL 2 MG/2ML IJ SOLN
1.0000 mg | Freq: Once | INTRAMUSCULAR | Status: AC | PRN
  Administered 2016-05-05: 1 mg via INTRAVENOUS

## 2016-05-05 MED ORDER — FENTANYL CITRATE (PF) 100 MCG/2ML IJ SOLN
100.0000 ug | Freq: Once | INTRAMUSCULAR | Status: AC
  Administered 2016-05-05: 100 ug via INTRAVENOUS

## 2016-05-05 MED ORDER — OXYCODONE HCL 5 MG PO TABS
10.0000 mg | ORAL_TABLET | Freq: Once | ORAL | Status: AC
  Administered 2016-05-05: 10 mg via ORAL
  Filled 2016-05-05: qty 2

## 2016-05-05 MED ORDER — MIDAZOLAM HCL 2 MG/2ML IJ SOLN: 1.0000 mg | Freq: Once | INTRAMUSCULAR | Status: DC

## 2016-05-05 MED ORDER — LACTATED RINGERS IV SOLN: INTRAVENOUS | Status: DC

## 2016-05-05 MED ORDER — FENTANYL CITRATE (PF) 100 MCG/2ML IJ SOLN: 100.0000 ug | Freq: Once | INTRAMUSCULAR | Status: DC | PRN

## 2016-05-05 NOTE — Anesthesia Preprocedure Evaluation (Signed)
Anesthesia Evaluation  Patient identified by MRN, date of birth, ID band Patient awake  General Assessment Comment:Past Medical History: Diagnosis Date . Gestational diabetes   with 1st pregnancy . Hypertension  . Migraine  . Urinary tract infection     Reviewed: Allergy & Precautions, NPO status , Patient's Chart, lab work & pertinent test results  Airway Mallampati: II  TM Distance: >3 FB Neck ROM: Full    Dental no notable dental hx. (+) Dental Advisory Given   Pulmonary Current Smoker,    Pulmonary exam normal breath sounds clear to auscultation       Cardiovascular Exercise Tolerance: Good hypertension, Pt. on home beta blockers and Pt. on medications Normal cardiovascular exam Rhythm:Regular Rate:Normal     Neuro/Psych  Headaches,  Neuromuscular disease negative psych ROS   GI/Hepatic negative GI ROS, Neg liver ROS,   Endo/Other  negative endocrine ROSdiabetes  Renal/GU negative Renal ROS  negative genitourinary   Musculoskeletal negative musculoskeletal ROS (+)   Abdominal (+) + obese,   Peds negative pediatric ROS (+)  Hematology negative hematology ROS (+)   Anesthesia Other Findings   Reproductive/Obstetrics negative OB ROS                             Anesthesia Physical  Anesthesia Plan  ASA: III  Anesthesia Plan: Epidural   Post-op Pain Management:    Induction:   Airway Management Planned: Natural Airway  Additional Equipment:   Intra-op Plan:   Post-operative Plan:   Informed Consent: I have reviewed the patients History and Physical, chart, labs and discussed the procedure including the risks, benefits and alternatives for the proposed anesthesia with the patient or authorized representative who has indicated his/her understanding and acceptance.     Plan Discussed with: CRNA  Anesthesia Plan Comments:         Anesthesia Quick  Evaluation

## 2016-05-05 NOTE — MAU Provider Note (Signed)
History     CSN: [id number]  Arrival date and time: 05/05/16 1629   None     Chief Complaint  Patient presents with  . Headache   Postpartum 1 week c/o generalized HA and neck pain since PPD 1. HA is better when lying supine and worse with ambulation and movement. She reports feeling chills today. She has not checked her temperature at home. She denies visual disturbances and epigastric pain. She reports lochia is tapering off. She has increased her water intake. She used Fioricet and Ibuprofen and got some relied for only about 20 minutes.    OB History    Gravida Para Term Preterm AB Living   7 4 4  0 3 4   SAB TAB Ectopic Multiple Live Births   1 2 0 0 4      Past Medical History:  Diagnosis Date  . Gestational diabetes    with 1st pregnancy  . Hypertension   . Migraine   . Urinary tract infection     Past Surgical History:  Procedure Laterality Date  . CHOLECYSTECTOMY N/A 11/2013  . DILATION AND EVACUATION  03/09/2011   Procedure: DILATATION AND EVACUATION (D&E);  Surgeon: Donnamae Jude, MD;  Location: [facility];  Service: Gynecology;  Laterality: N/A;  . TONSILLECTOMY  1988  . Tubes in ears  Baby    Family History  Problem Relation Age of Onset  . Hypertension Father   . Heart failure Father   . Hepatitis Father   . Cirrhosis Father   . Hyperlipidemia Father   . Migraines Father   . Mental illness Father     Depression  . Diabetes Mother   . Osteoarthritis Mother   . Hypertension Mother   . Hypothyroidism Mother   . Heart disease Mother   . Hyperlipidemia Mother   . Migraines Mother   . Mental illness Mother     Depression  . Breast cancer Maternal Aunt 70  . Cancer Sister 20    Cervical  . Heart disease Sister     Heart Stops  . Migraines Sister   . Seizures Sister 11    unsure if this or heart problem  . Mental illness Sister     depression  . Heart disease Maternal Grandmother     Social History  Substance Use Topics  . Smoking status:  Current Some Day Smoker    Packs/day: 0.50    Years: 12.00    Types: Cigarettes  . Smokeless tobacco: Never Used  . Alcohol use 0.5 oz/week    1 Standard drinks or equivalent per week     Comment: prior to pregnancy    Allergies: No Known Allergies  Prescriptions Prior to Admission  Medication Sig Dispense Refill Last Dose  . atenolol (TENORMIN) 50 MG tablet TAKE 1 TABLET BY MOUTH EVERY DAY 30 tablet 3 05/02/2016 at Unknown time  . butalbital-acetaminophen-caffeine (FIORICET, ESGIC) 50-325-40 MG tablet Take 1-2 tablets by mouth every 6 (six) hours as needed for headache. 20 tablet 0   . ferrous sulfate (FERROUSUL) 325 (65 FE) MG tablet Take 1 tablet (325 mg total) by mouth daily with breakfast. 30 minutes BEFORE food, with OJ or vitamin C. 90 tablet 3 Past Week at Unknown time  . flintstones complete (FLINTSTONES) 60 MG chewable tablet Chew 2 tablets by mouth daily.   05/02/2016 at Unknown time  . ibuprofen (ADVIL,MOTRIN) 600 MG tablet Take 1 tablet (600 mg total) by mouth every 6 (six) hours. 30 tablet  0 05/02/2016 at Unknown time  . senna-docusate (SENOKOT-S) 8.6-50 MG tablet Take 2 tablets by mouth daily. 30 tablet 0     Review of Systems  Constitutional: Positive for chills. Negative for fever.  Gastrointestinal: Positive for nausea. Negative for abdominal pain and vomiting.  Neurological: Positive for headaches.   Physical Exam   Blood pressure 145/57, pulse 76, temperature 98.6 F (37 C), temperature source Oral, resp. rate 20, SpO2 100 %, unknown if currently breastfeeding. Vitals:   05/05/16 1647 05/05/16 1701 05/05/16 1716 05/05/16 1731  BP: 145/57 126/81 136/90 134/91  Pulse: 76 68 73 65  Resp:      Temp:      TempSrc:      SpO2:        Physical Exam  Constitutional: She is oriented to person, place, and time. She appears well-developed and well-nourished. No distress (lying supine with back of hand covering eyes ).  HENT:  Head: Normocephalic and atraumatic.   Neck: Normal range of motion. Neck supple.  Cardiovascular: Normal rate and regular rhythm.   Respiratory: Effort normal and breath sounds normal.  GI: Soft. She exhibits no distension. There is no tenderness.  Musculoskeletal: Normal range of motion.  Neurological: She is alert and oriented to person, place, and time.  Skin: Skin is warm and dry.  Psychiatric: She has a normal mood and affect.   Results for orders placed or performed during the hospital encounter of 05/05/16 (from the past 24 hour(s))  CBC     Status: Abnormal   Collection Time: 05/05/16  5:23 PM  Result Value Ref Range   WBC 11.5 (H) 4.0 - 10.5 K/uL   RBC 3.87 3.87 - 5.11 MIL/uL   Hemoglobin 11.8 (L) 12.0 - 15.0 g/dL   HCT 33.4 (L) 36.0 - 46.0 %   MCV 86.3 78.0 - 100.0 fL   MCH 30.5 26.0 - 34.0 pg   MCHC 35.3 30.0 - 36.0 g/dL   RDW 13.2 11.5 - 15.5 %   Platelets 280 150 - 400 K/uL  Comprehensive metabolic panel     Status: Abnormal   Collection Time: 05/05/16  5:23 PM  Result Value Ref Range   Sodium 140 135 - 145 mmol/L   Potassium 3.6 3.5 - 5.1 mmol/L   Chloride 108 101 - 111 mmol/L   CO2 24 22 - 32 mmol/L   Glucose, Bld 89 65 - 99 mg/dL   BUN 15 6 - 20 mg/dL   Creatinine, Ser 1.05 (H) 0.44 - 1.00 mg/dL   Calcium 9.6 8.9 - 10.3 mg/dL   Total Protein 7.0 6.5 - 8.1 g/dL   Albumin 3.6 3.5 - 5.0 g/dL   AST 13 (L) 15 - 41 U/L   ALT 19 14 - 54 U/L   Alkaline Phosphatase 77 38 - 126 U/L   Total Bilirubin 0.3 0.3 - 1.2 mg/dL   GFR calc non Af Amer >60 >60 mL/min   GFR calc Af Amer >60 >60 mL/min   Anion gap 8 5 - 15  Protein / creatinine ratio, urine     Status: None   Collection Time: 05/05/16  5:25 PM  Result Value Ref Range   Creatinine, Urine 64.00 mg/dL   Total Protein, Urine 7 mg/dL   Protein Creatinine Ratio 0.11 0.00 - 0.15 mg/mg[Cre]  Urinalysis, Routine w reflex microscopic (not at [facility])     Status: Abnormal   Collection Time: 05/05/16  5:25 PM  Result Value Ref Range   Color, Urine  YELLOW  YELLOW   APPearance CLEAR CLEAR   Specific Gravity, Urine 1.010 1.005 - 1.030   pH 6.0 5.0 - 8.0   Glucose, UA NEGATIVE NEGATIVE mg/dL   Hgb urine dipstick LARGE (A) NEGATIVE   Bilirubin Urine NEGATIVE NEGATIVE   Ketones, ur NEGATIVE NEGATIVE mg/dL   Protein, ur NEGATIVE NEGATIVE mg/dL   Nitrite NEGATIVE NEGATIVE   Leukocytes, UA MODERATE (A) NEGATIVE  Urine microscopic-add on     Status: Abnormal   Collection Time: 05/05/16  5:25 PM  Result Value Ref Range   Squamous Epithelial / LPF 0-5 (A) NONE SEEN   WBC, UA 0-5 0 - 5 WBC/hpf   RBC / HPF 6-30 0 - 5 RBC/hpf   Bacteria, UA NONE SEEN NONE SEEN    MAU Course  Procedures Oxycodone 10 mg po x1 Anesthesia consult  MDM Labs ordered and reviewed. No evidence of pre-e. HA likely epidural related. Anesthesia in to evaluate HA and recommends blood patch.   Assessment and Plan   1. Postpartum spinal headache   2. Leukocytosis, unspecified type   3. Chills (without fever)    Discharge to PACU Management per Anesthesia Follow up as scheduled in office for pp visit  Julianne Handler, CNM 05/05/2016, 5:00 PM

## 2016-05-05 NOTE — MAU Note (Signed)
Urine sent to lab 

## 2016-05-05 NOTE — MAU Note (Signed)
Pt states she has been having a headache since delivery.  Pt states it is worse when she sits up.  Pt states she was seen Wednesday and was told if it got worse she should come back.  Pt states it is worse today and now she has chills and nausea.

## 2016-05-05 NOTE — Anesthesia Postprocedure Evaluation (Signed)
Anesthesia Post Note  Patient: [name]  Procedure(s) Performed: * No procedures listed *  Patient location during evaluation: PACU Anesthesia Type: MAC and Epidural Level of consciousness: awake and alert Pain management: pain level controlled Vital Signs Assessment: post-procedure vital signs reviewed and stable Respiratory status: spontaneous breathing and respiratory function stable Cardiovascular status: blood pressure returned to baseline and stable Postop Assessment: no headache Anesthetic complications: no Comments: Pt instructed to remain supine tonight and avoid straining for 48 hours.     Last Vitals:  Vitals:   05/05/16 1915 05/05/16 1945  BP: (!) 157/91 (!) 146/86  Pulse: (!) 57 (!) 58  Resp: (!) 22 (!) 25  Temp:      Last Pain:  Vitals:   05/05/16 1915  PainSc: 2    Pain Goal:                 Delma Freeze

## 2016-05-05 NOTE — Anesthesia Procedure Notes (Signed)
Epidural Patient location during procedure: OB Start time: 05/05/2016 6:49 PM End time: 05/05/2016 7:09 PM  Staffing Anesthesiologist: Duane Boston Performed: anesthesiologist   Preanesthetic Checklist Completed: patient identified, site marked, pre-op evaluation, timeout performed, IV checked, risks and benefits discussed and monitors and equipment checked  Epidural Patient position: sitting Prep: DuraPrep Patient monitoring: heart rate, cardiac monitor, continuous pulse ox and blood pressure Approach: midline Location: L2-L3 Injection technique: LOR saline  Needle:  Needle type: Tuohy  Needle gauge: 17 G Needle length: 9 cm Needle insertion depth: 7 cm Test dose: negative and Other  Assessment Events: blood not aspirated, injection not painful, no injection resistance and negative IV test  Additional Notes Informed consent obtained prior to proceeding including risk of failure, 1% risk of PDPH, risk of minor discomfort and bruising.  Discussed rare but serious complications including epidural abscess, permanent nerve injury, epidural hematoma.  Discussed alternatives to epidural analgesia and patient desires to proceed.  Timeout performed pre-procedure verifying patient name, procedure, and platelet count.  Patient tolerated procedure well. 20 cc of autologous blood injected after it was drawn by sterile technique

## 2016-05-05 NOTE — MAU Note (Signed)
CRNA started peripheral IV.  Pt taken by NT to PACU in wheelchair for blood patch.

## 2016-05-07 ENCOUNTER — Encounter: Payer: Self-pay | Admitting: *Deleted

## 2016-05-08 ENCOUNTER — Encounter: Payer: 59 | Admitting: Obstetrics & Gynecology

## 2016-05-08 ENCOUNTER — Ambulatory Visit (HOSPITAL_COMMUNITY): Payer: 59

## 2016-05-10 ENCOUNTER — Encounter: Payer: Self-pay | Admitting: *Deleted

## 2016-05-11 ENCOUNTER — Other Ambulatory Visit: Payer: 59

## 2016-05-11 ENCOUNTER — Encounter: Payer: Self-pay | Admitting: *Deleted

## 2016-05-17 ENCOUNTER — Encounter (INDEPENDENT_AMBULATORY_CARE_PROVIDER_SITE_OTHER): Payer: 59 | Admitting: *Deleted

## 2016-05-17 DIAGNOSIS — O1092 Unspecified pre-existing hypertension complicating childbirth: Secondary | ICD-10-CM

## 2016-06-05 ENCOUNTER — Ambulatory Visit: Payer: 59 | Admitting: Family Medicine

## 2016-06-07 ENCOUNTER — Ambulatory Visit (INDEPENDENT_AMBULATORY_CARE_PROVIDER_SITE_OTHER): Payer: 59 | Admitting: Obstetrics & Gynecology

## 2016-06-07 ENCOUNTER — Encounter: Payer: Self-pay | Admitting: Obstetrics & Gynecology

## 2016-06-07 VITALS — BP 141/95 | HR 76 | Wt 182.0 lb

## 2016-06-07 DIAGNOSIS — N8111 Cystocele, midline: Secondary | ICD-10-CM

## 2016-06-07 NOTE — Progress Notes (Signed)
   Subjective:    Patient ID: [name], female    DOB: December 02, 1980, 35 y.o.   MRN: [id number]  HPI  35 yo MW P4 here for a bulging sensation in her vagina with standing and walking. She reports rare GSUI, denies urge incontinence. Her pp visit is next week. She is using abstinence for contraception. Thinks that husband may get vasectomy in the future but no appt made.  Review of Systems     Objective:   Physical Exam [id number] Breathing, conversing, and ambulating normally 3rd degree cystocele, no uterine prolapse I fitted her with a #4 ring pessary which seemed to work well, but she then decided to have surgery in Feb.       Assessment & Plan:  Symptomatic cystocele- plan for Anterior repair, perineoplasty in mid to late Feb

## 2016-06-12 ENCOUNTER — Encounter: Payer: Self-pay | Admitting: *Deleted

## 2016-06-14 ENCOUNTER — Ambulatory Visit (INDEPENDENT_AMBULATORY_CARE_PROVIDER_SITE_OTHER): Payer: 59 | Admitting: Family Medicine

## 2016-06-14 ENCOUNTER — Encounter: Payer: Self-pay | Admitting: Family Medicine

## 2016-06-14 NOTE — Patient Instructions (Signed)
Anterior and Posterior Colporrhaphy Anterior or posterior colporrhaphy is surgery to fix a prolapse of organs in the genital tract. Prolapse means the falling down, bulging, dropping, or drooping of an organ. Organs that commonly prolapse include the rectum, bladder, vagina, and uterus. Prolapse can affect a single organ or several organs at the same time. This often worsens when women stop having their monthly periods (menopause) because estrogen loss weakens the muscles and tissues in the genital tract. In addition, prolapse happens when the organs are damaged or weakened. This commonly happens after childbirth and as a result of aging. Surgery is often done for severe prolapses.  The type of colporrhaphy done depends on the type of genital prolapse. Types of genital prolapse include the following:   Cystocele. This is a prolapse of the upper (anterior) wall of the vagina. The anterior wall bulges into the vagina and brings the bladder with it.   Rectocele. This is a prolapse of the lower (posterior) wall of the vagina. The posterior vaginal wall bulges into the vagina and brings the rectum with it.   Enterocele. This is a prolapse of part of the pelvic organs called the pouch of Douglas. It also involves a portion of the small bowel. It appears as a bulge under the neck of the uterus at the top of the back wall of the vagina.   Procidentia. This is a complete prolapse of the uterus and the cervix. The prolapse can be seen and felt coming out of the vagina. LET [facility] PROVIDER KNOW ABOUT:   Any allergies you have.   All medicines you are taking, including vitamins, herbs, eye drops, creams, and over-the-counter medicines.   Previous problems you or members of your family have had with the use of anesthetics.   Any blood disorders you have.   Previous surgeries you have had.   Medical conditions you have.   Smoking history or history of alcohol use.   Possibility of  pregnancy, if this applies.  RISKS AND COMPLICATIONS Generally, anterior or posterior colporrhaphy is a safe procedure. However, as with any procedure, complications can occur. Possible complications include:   Infection.   Damage to other organs during surgery.   Bleeding after surgery.   Problems urinating.   Problems from the anesthetic.  BEFORE THE PROCEDURE  Ask your health care provider about changing or stopping your regular medicines.   Do not eat or drink anything for at least 8 hours before the surgery.   If you smoke, do not smoke for at least 2 weeks before the surgery.   Make plans to have someone drive you home after your hospital stay. Also, arrange for someone to help you with activities during recovery. PROCEDURE  You may be given medicine to help you relax before the surgery (sedative). During the surgery you will be given medicine to make you sleep through the procedure (general anesthetic) or medicine to numb you from the waist down (spinal anesthetic). This medicine will be given through an intravenous (IV) access tube that is put into one of your veins.  The procedure will vary depending on the type of repair:   Anterior repair. A cut (incision) is made in the midline section of the front part of the vaginal wall. A triangular-shaped piece of vaginal tissue is removed, and the stronger, healthier tissue is sewn together in order to support and suspend the bladder.   Posterior repair. An incision is made midline on the back wall of the  vagina. A triangular portion of vaginal skin is removed to expose the muscle. Excess tissue is removed, and stronger, healthier muscle and ligament tissue is sewn together to support the rectum.   Anterior and posterior repair. Both procedures are done during the same surgery. AFTER THE PROCEDURE You will be taken to a recovery area. Your blood pressure, pulse, breathing, and temperature (vital signs) will be monitored.  This is done until you are stable. Then you will be transferred to a hospital room.  After surgery, you will have a small rubber tube in place to drain your bladder (urinary catheter). This will be in place for 2 to 7 days or until your bladder is working properly on its own. The IV access tube will be removed in 1 to 3 days. You may have a gauze packing in your vagina to prevent bleeding. This will be removed 2 or 3 days after the surgery. You will likely need to stay in the hospital for 3 to 5 days.  This information is not intended to replace advice given to you by your health care provider. Make sure you discuss any questions you have with your health care provider. Document Released: 09/08/2003 Document Revised: 02/18/2013 Document Reviewed: 11/07/2012 Elsevier Interactive Patient Education  2017 [employer].

## 2016-06-14 NOTE — Progress Notes (Signed)
Post Partum Exam  [name] is a 35 y.o. [id number] female who presents for a postpartum visit. She is 6 weeks postpartum following a spontaneous vaginal delivery. I have fully reviewed the prenatal and intrapartum course. The delivery was at 34 gestational weeks.  Anesthesia: epidural. Postpartum course has been treated for cystocele, repair scheduled for February. Baby's course has been unremarkable. Baby is feeding by bottle - Similac Advance. Bleeding no bleeding. Bowel function is normal. Bladder function is abnormal: Cystocele. Patient is not sexually active. Contraception method is IUD. Postpartum depression screening:neg  The following portions of the patient's history were reviewed and updated as appropriate: allergies, current medications, past family history, past medical history, past social history, past surgical history and problem list.  Review of Systems Pertinent items noted in HPI and remainder of comprehensive ROS otherwise negative.    Objective:    BP 116/78 mmHg  Pulse 78  Resp 16  Ht 5\' 5"  (1.651 m)  Wt 211 lb (95.709 kg)  BMI 35.11 kg/m2  Breastfeeding? Yes  General:  alert, cooperative and appears stated age  Lungs: normal effort  Heart:  regular rate and rhythm  Abdomen: soft, non-tender; bowel sounds normal; no masses,  no organomegaly        Assessment:    Normal postpartum exam. Pap smear not done at today's visit.   Plan:   1. Contraception: IUD 2. Will get IUD placed with her anterior repair. 3. Follow up in: 8 weeks or as needed.

## 2016-06-20 ENCOUNTER — Telehealth: Payer: Self-pay | Admitting: *Deleted

## 2016-06-20 DIAGNOSIS — O99345 Other mental disorders complicating the puerperium: Principal | ICD-10-CM

## 2016-06-20 DIAGNOSIS — F53 Postpartum depression: Secondary | ICD-10-CM

## 2016-06-20 MED ORDER — SERTRALINE HCL 50 MG PO TABS: 50.0000 mg | ORAL_TABLET | Freq: Every day | ORAL | 4 refills | Status: DC

## 2016-06-20 NOTE — Telephone Encounter (Signed)
Informed pt of recommendation, will start Zoloft and schedule appt with Roselyn Reef at [facility] for counseling.  Sent rx to pharmacy and informed pt to start by taking a half tablet the first 3-5 days, then increase to the full dose.  Pt acknowledged instructions. Pt also requesting something to be called in like Ativan to take as need when she is having a difficult time with processing and handling situations, request sent to Dr Kennon Rounds, awaiting response.

## 2016-06-20 NOTE — Telephone Encounter (Signed)
-----   Message from Donnamae Jude, MD sent at 06/19/2016  1:11 PM EST ----- Regarding: RE: Advise/RX Contact: [phone] That sounds good--still start with 1/2 tab for 3-5 days--and yes to refer to Roselyn Reef ----- Message ----- From: Ricka Burdock, RN Sent: 06/18/2016   8:03 AM To: Donnamae Jude, MD Subject: RE: Advise/RX                                  I dont see anything in her medication history, do you just want to start with Zoloft 50mg ?  And when I speak to her I can offer to make an appt with York Cerise at [facility].  ----- Message ----- From: Donnamae Jude, MD Sent: 06/15/2016  12:08 PM To: Ricka Burdock, RN Subject: RE: Advise/RX                                  What has she been on--ok to call in something that she has tried previously--start with 1/2 dose x 3-5 days, then increase--- ----- Message ----- From: Ricka Burdock, RN Sent: 06/15/2016  11:17 AM To: Donnamae Jude, MD Subject: Melton Alar: Advise/RX                                  Please advise, thanks. ----- Message ----- From: Francia Greaves Sent: 06/15/2016   9:20 AM To: Ricka Burdock, RN Subject: Advise/RX                                      Thinks she needs meds for postpartum depression

## 2016-06-20 NOTE — Telephone Encounter (Signed)
-----   Message from Donnamae Jude, MD sent at 06/19/2016  1:11 PM EST ----- Regarding: RE: Advise/RX Contact: [phone] That sounds good--still start with 1/2 tab for 3-5 days--and yes to refer to Roselyn Reef ----- Message ----- From: Ricka Burdock, RN Sent: 06/18/2016   8:03 AM To: Donnamae Jude, MD Subject: RE: Advise/RX                                  I dont see anything in her medication history, do you just want to start with Zoloft 50mg ?  And when I speak to her I can offer to make an appt with York Cerise at [facility].  ----- Message ----- From: Donnamae Jude, MD Sent: 06/15/2016  12:08 PM To: Ricka Burdock, RN Subject: RE: Advise/RX                                  What has she been on--ok to call in something that she has tried previously--start with 1/2 dose x 3-5 days, then increase--- ----- Message ----- From: Ricka Burdock, RN Sent: 06/15/2016  11:17 AM To: Donnamae Jude, MD Subject: Melton Alar: Advise/RX                                  Please advise, thanks. ----- Message ----- From: Francia Greaves Sent: 06/15/2016   9:20 AM To: Ricka Burdock, RN Subject: Advise/RX                                      Thinks she needs meds for postpartum depression

## 2016-06-21 NOTE — Telephone Encounter (Signed)
Informed pt that Dr Kennon Rounds could not call her anything at this time for anxiety, pt states she started the Zoloft today and will see how it does for her, discussed anxiety reducing techniques with the pt.

## 2016-06-26 ENCOUNTER — Institutional Professional Consult (permissible substitution): Payer: Self-pay

## 2016-07-04 ENCOUNTER — Encounter (HOSPITAL_COMMUNITY): Payer: Self-pay | Admitting: *Deleted

## 2016-07-27 DIAGNOSIS — F411 Generalized anxiety disorder: Secondary | ICD-10-CM | POA: Insufficient documentation

## 2016-07-27 DIAGNOSIS — Z23 Encounter for immunization: Secondary | ICD-10-CM | POA: Diagnosis not present

## 2016-07-27 DIAGNOSIS — Z0000 Encounter for general adult medical examination without abnormal findings: Secondary | ICD-10-CM | POA: Diagnosis not present

## 2016-07-27 DIAGNOSIS — Z720 Tobacco use: Secondary | ICD-10-CM | POA: Diagnosis not present

## 2016-07-27 DIAGNOSIS — E781 Pure hyperglyceridemia: Secondary | ICD-10-CM | POA: Diagnosis not present

## 2016-07-27 DIAGNOSIS — I10 Essential (primary) hypertension: Secondary | ICD-10-CM | POA: Diagnosis not present

## 2016-08-22 NOTE — Patient Instructions (Addendum)
Your procedure is scheduled on:  Tuesday, Aug 28, 2016  Enter through the [employer] [facility] at:  11:45 AM  Pick up the phone at the desk and dial [phone].  Call this number if you have problems the morning of surgery: [phone].  Remember: Do NOT eat food:  After 5:00 AM day of surgery  Do NOT drink clear liquids after:  9:00 AM day of surgery  Take these medicines the morning of surgery with a SIP OF WATER:  Atenolol, Hydrochlorothiazide, Sertraline  Stop ALL herbal medications at this time  Do NOT smoke the day of surgery.  Do NOT wear jewelry (body piercing), metal hair clips/bobby pins, make-up, or nail polish. Do NOT wear lotions, powders, or perfumes.  You may wear deodorant. Do NOT shave for 48 hours prior to surgery. Do NOT bring valuables to the hospital. Contacts, dentures, or bridgework may not be worn into surgery.  Leave suitcase in car.  After surgery it may be brought to your room.  For patients admitted to the hospital, checkout time is 11:00 AM the day of discharge.  Have a responsible adult drive you home and stay with you for 24 hours after your procedure  Bring a copy of your healthcare power of attorney and living will documents.  **Effective Friday, Jan. 12, 2018, [place] will implement no hospital visitations from children age 66 and younger due to a steady increase in flu activity in our community and hospitals. **

## 2016-08-23 ENCOUNTER — Encounter (HOSPITAL_COMMUNITY)
Admission: RE | Admit: 2016-08-23 | Discharge: 2016-08-23 | Disposition: A | Discharge status: Home / Self Care | Payer: 59 | Source: Ambulatory Visit | Attending: Obstetrics & Gynecology | Admitting: Obstetrics & Gynecology

## 2016-08-23 ENCOUNTER — Encounter (HOSPITAL_COMMUNITY): Payer: Self-pay

## 2016-08-23 ENCOUNTER — Encounter (INDEPENDENT_AMBULATORY_CARE_PROVIDER_SITE_OTHER): Payer: Self-pay | Admitting: *Deleted

## 2016-08-23 DIAGNOSIS — Z01812 Encounter for preprocedural laboratory examination: Secondary | ICD-10-CM | POA: Diagnosis not present

## 2016-08-23 HISTORY — DX: Anemia, unspecified: D64.9

## 2016-08-23 LAB — BASIC METABOLIC PANEL
Anion gap: 8 (ref 5–15)
BUN: 15 mg/dL (ref 6–20)
CO2: 27 mmol/L (ref 22–32)
Calcium: 9.5 mg/dL (ref 8.9–10.3)
Chloride: 103 mmol/L (ref 101–111)
Creatinine, Ser: 0.68 mg/dL (ref 0.44–1.00)
GFR calc Af Amer: >60 mL/min (ref >60)
GLUCOSE: 87 mg/dL (ref 65–99)
POTASSIUM: 3.8 mmol/L (ref 3.5–5.1)
Sodium: 138 mmol/L (ref 135–145)

## 2016-08-23 LAB — CBC
HEMATOCRIT: 38.3 % (ref 36.0–46.0)
Hemoglobin: 13.7 g/dL (ref 12.0–15.0)
MCH: 29.9 pg (ref 26.0–34.0)
MCHC: 35.8 g/dL (ref 30.0–36.0)
MCV: 83.6 fL (ref 78.0–100.0)
PLATELETS: 262 10*3/uL (ref 150–400)
RBC: 4.58 MIL/uL (ref 3.87–5.11)
RDW: 13.5 % (ref 11.5–15.5)
WBC: 9.2 10*3/uL (ref 4.0–10.5)

## 2016-08-28 ENCOUNTER — Ambulatory Visit (HOSPITAL_COMMUNITY): Payer: 59 | Admitting: Anesthesiology

## 2016-08-28 ENCOUNTER — Encounter (HOSPITAL_COMMUNITY): Payer: Self-pay

## 2016-08-28 ENCOUNTER — Ambulatory Visit (HOSPITAL_COMMUNITY)
Admission: RE | Admit: 2016-08-28 | Discharge: 2016-08-28 | Disposition: A | Discharge status: Home / Self Care | Payer: 59 | Source: Ambulatory Visit | Attending: Obstetrics & Gynecology | Admitting: Obstetrics & Gynecology

## 2016-08-28 ENCOUNTER — Encounter (HOSPITAL_COMMUNITY)
Admission: RE | Disposition: A | Discharge status: Home / Self Care | Payer: Self-pay | Source: Ambulatory Visit | Attending: Obstetrics & Gynecology

## 2016-08-28 DIAGNOSIS — N8110 Cystocele, unspecified: Secondary | ICD-10-CM | POA: Diagnosis not present

## 2016-08-28 DIAGNOSIS — Z302 Encounter for sterilization: Secondary | ICD-10-CM | POA: Diagnosis not present

## 2016-08-28 DIAGNOSIS — I10 Essential (primary) hypertension: Secondary | ICD-10-CM | POA: Diagnosis not present

## 2016-08-28 DIAGNOSIS — Z6831 Body mass index (BMI) 31.0-31.9, adult: Secondary | ICD-10-CM | POA: Diagnosis not present

## 2016-08-28 DIAGNOSIS — F17210 Nicotine dependence, cigarettes, uncomplicated: Secondary | ICD-10-CM | POA: Diagnosis not present

## 2016-08-28 DIAGNOSIS — E669 Obesity, unspecified: Secondary | ICD-10-CM | POA: Diagnosis not present

## 2016-08-28 DIAGNOSIS — N938 Other specified abnormal uterine and vaginal bleeding: Secondary | ICD-10-CM | POA: Insufficient documentation

## 2016-08-28 HISTORY — PX: CYSTOCELE REPAIR: SHX163

## 2016-08-28 HISTORY — PX: PERINEOPLASTY: SHX2218

## 2016-08-28 HISTORY — PX: LAPAROSCOPIC BILATERAL SALPINGECTOMY: SHX5889

## 2016-08-28 LAB — PREGNANCY, URINE: Preg Test, Ur: NEGATIVE

## 2016-08-28 SURGERY — COLPORRHAPHY, ANTERIOR, FOR CYSTOCELE REPAIR
Anesthesia: General | Site: Vagina

## 2016-08-28 MED ORDER — ROCURONIUM BROMIDE 100 MG/10ML IV SOLN
INTRAVENOUS | Status: DC | PRN
  Administered 2016-08-28: 30 mg via INTRAVENOUS

## 2016-08-28 MED ORDER — FENTANYL CITRATE (PF) 250 MCG/5ML IJ SOLN
INTRAMUSCULAR | Status: DC | PRN
  Administered 2016-08-28 (×2): 100 ug via INTRAVENOUS
  Administered 2016-08-28: 50 ug via INTRAVENOUS

## 2016-08-28 MED ORDER — ONDANSETRON HCL 4 MG/2ML IJ SOLN
INTRAMUSCULAR | Status: AC
  Filled 2016-08-28: qty 2

## 2016-08-28 MED ORDER — MIDAZOLAM HCL 2 MG/2ML IJ SOLN
INTRAMUSCULAR | Status: AC
  Filled 2016-08-28: qty 2

## 2016-08-28 MED ORDER — BUPIVACAINE-EPINEPHRINE (PF) 0.5% -1:200000 IJ SOLN
INTRAMUSCULAR | Status: AC
  Filled 2016-08-28: qty 30

## 2016-08-28 MED ORDER — SUCCINYLCHOLINE CHLORIDE 200 MG/10ML IV SOSY
PREFILLED_SYRINGE | INTRAVENOUS | Status: AC
  Filled 2016-08-28: qty 10

## 2016-08-28 MED ORDER — KETOROLAC TROMETHAMINE 30 MG/ML IJ SOLN
INTRAMUSCULAR | Status: AC
  Filled 2016-08-28: qty 1

## 2016-08-28 MED ORDER — NEOSTIGMINE METHYLSULFATE 10 MG/10ML IV SOLN
INTRAVENOUS | Status: AC
  Filled 2016-08-28: qty 1

## 2016-08-28 MED ORDER — BUPIVACAINE HCL (PF) 0.5 % IJ SOLN
INTRAMUSCULAR | Status: DC | PRN
  Administered 2016-08-28: 8 mL

## 2016-08-28 MED ORDER — SUGAMMADEX SODIUM 200 MG/2ML IV SOLN
INTRAVENOUS | Status: AC
  Filled 2016-08-28: qty 2

## 2016-08-28 MED ORDER — GLYCOPYRROLATE 0.2 MG/ML IJ SOLN
INTRAMUSCULAR | Status: DC | PRN
  Administered 2016-08-28: 0.1 mg via INTRAVENOUS

## 2016-08-28 MED ORDER — MIDAZOLAM HCL 5 MG/5ML IJ SOLN
INTRAMUSCULAR | Status: DC | PRN
  Administered 2016-08-28: 2 mg via INTRAVENOUS

## 2016-08-28 MED ORDER — BUPIVACAINE-EPINEPHRINE 0.5% -1:200000 IJ SOLN
INTRAMUSCULAR | Status: DC | PRN
  Administered 2016-08-28: 10 mL

## 2016-08-28 MED ORDER — OXYCODONE HCL 5 MG PO TABS: 5.0000 mg | ORAL_TABLET | Freq: Once | ORAL | 0 refills | Status: DC | PRN

## 2016-08-28 MED ORDER — FLAVOXATE HCL 100 MG PO TABS: 100.0000 mg | ORAL_TABLET | Freq: Three times a day (TID) | ORAL | 1 refills | Status: DC | PRN

## 2016-08-28 MED ORDER — LIDOCAINE HCL (CARDIAC) 20 MG/ML IV SOLN
INTRAVENOUS | Status: DC | PRN
  Administered 2016-08-28: 80 mg via INTRAVENOUS

## 2016-08-28 MED ORDER — ONDANSETRON HCL 4 MG/2ML IJ SOLN
INTRAMUSCULAR | Status: DC | PRN
  Administered 2016-08-28: 4 mg via INTRAVENOUS

## 2016-08-28 MED ORDER — LACTATED RINGERS IV SOLN
INTRAVENOUS | Status: DC
  Administered 2016-08-28 (×2): via INTRAVENOUS

## 2016-08-28 MED ORDER — OXYCODONE HCL 5 MG/5ML PO SOLN: 5.0000 mg | Freq: Once | ORAL | Status: AC | PRN

## 2016-08-28 MED ORDER — GLYCOPYRROLATE 0.2 MG/ML IJ SOLN
INTRAMUSCULAR | Status: AC
  Filled 2016-08-28: qty 4

## 2016-08-28 MED ORDER — SCOPOLAMINE 1 MG/3DAYS TD PT72
MEDICATED_PATCH | TRANSDERMAL | Status: AC
  Administered 2016-08-28: 1.5 mg via TRANSDERMAL
  Filled 2016-08-28: qty 1

## 2016-08-28 MED ORDER — OXYCODONE HCL 5 MG PO TABS
ORAL_TABLET | ORAL | Status: AC
  Filled 2016-08-28: qty 1

## 2016-08-28 MED ORDER — FENTANYL CITRATE (PF) 250 MCG/5ML IJ SOLN
INTRAMUSCULAR | Status: AC
  Filled 2016-08-28: qty 5

## 2016-08-28 MED ORDER — SUGAMMADEX SODIUM 200 MG/2ML IV SOLN
INTRAVENOUS | Status: DC | PRN
  Administered 2016-08-28: 200 mg via INTRAVENOUS

## 2016-08-28 MED ORDER — OXYCODONE HCL 5 MG PO TABS
5.0000 mg | ORAL_TABLET | Freq: Once | ORAL | Status: AC | PRN
  Administered 2016-08-28: 5 mg via ORAL

## 2016-08-28 MED ORDER — PROPOFOL 10 MG/ML IV BOLUS
INTRAVENOUS | Status: AC
  Filled 2016-08-28: qty 20

## 2016-08-28 MED ORDER — KETOROLAC TROMETHAMINE 30 MG/ML IJ SOLN
INTRAMUSCULAR | Status: DC | PRN
  Administered 2016-08-28: 30 mg via INTRAVENOUS

## 2016-08-28 MED ORDER — SCOPOLAMINE 1 MG/3DAYS TD PT72
1.0000 | MEDICATED_PATCH | Freq: Once | TRANSDERMAL | Status: DC
  Administered 2016-08-28: 1.5 mg via TRANSDERMAL

## 2016-08-28 MED ORDER — FENTANYL CITRATE (PF) 100 MCG/2ML IJ SOLN
25.0000 ug | INTRAMUSCULAR | Status: DC | PRN
  Administered 2016-08-28: 50 ug via INTRAVENOUS

## 2016-08-28 MED ORDER — SUCCINYLCHOLINE CHLORIDE 200 MG/10ML IV SOSY
PREFILLED_SYRINGE | INTRAVENOUS | Status: DC | PRN
  Administered 2016-08-28: 120 mg via INTRAVENOUS

## 2016-08-28 MED ORDER — PROPOFOL 10 MG/ML IV BOLUS
INTRAVENOUS | Status: DC | PRN
  Administered 2016-08-28: 200 mg via INTRAVENOUS

## 2016-08-28 MED ORDER — DEXAMETHASONE SODIUM PHOSPHATE 10 MG/ML IJ SOLN
INTRAMUSCULAR | Status: DC | PRN
  Administered 2016-08-28: 10 mg via INTRAVENOUS

## 2016-08-28 MED ORDER — SODIUM CHLORIDE 0.9 % IJ SOLN
INTRAMUSCULAR | Status: AC
  Filled 2016-08-28: qty 100

## 2016-08-28 MED ORDER — FENTANYL CITRATE (PF) 100 MCG/2ML IJ SOLN
INTRAMUSCULAR | Status: AC
  Filled 2016-08-28: qty 2

## 2016-08-28 MED ORDER — LIDOCAINE HCL (CARDIAC) 20 MG/ML IV SOLN
INTRAVENOUS | Status: AC
  Filled 2016-08-28: qty 5

## 2016-08-28 MED ORDER — DEXAMETHASONE SODIUM PHOSPHATE 10 MG/ML IJ SOLN
INTRAMUSCULAR | Status: AC
  Filled 2016-08-28: qty 1

## 2016-08-28 MED ORDER — BUPIVACAINE HCL (PF) 0.5 % IJ SOLN
INTRAMUSCULAR | Status: AC
  Filled 2016-08-28: qty 30

## 2016-08-28 MED ORDER — IBUPROFEN 600 MG PO TABS: 600.0000 mg | ORAL_TABLET | Freq: Four times a day (QID) | ORAL | 1 refills | Status: DC | PRN

## 2016-08-28 SURGICAL SUPPLY — 51 items
APPLICATOR COTTON TIP 6IN STRL (MISCELLANEOUS) ×3 IMPLANT
BLADE SURG 15 STRL LF C SS BP (BLADE) IMPLANT
BLADE SURG 15 STRL SS (BLADE)
CATH ROBINSON RED A/P 16FR (CATHETERS) IMPLANT
CHLORAPREP W/TINT 26ML (MISCELLANEOUS) ×3 IMPLANT
CLOTH BEACON ORANGE TIMEOUT ST (SAFETY) ×3 IMPLANT
COUNTER NEEDLE 1200 MAGNETIC (NEEDLE) IMPLANT
DECANTER SPIKE VIAL GLASS SM (MISCELLANEOUS) ×12 IMPLANT
DRSG OPSITE POSTOP 3X4 (GAUZE/BANDAGES/DRESSINGS) ×3 IMPLANT
DURAPREP 26ML APPLICATOR (WOUND CARE) ×3 IMPLANT
GLOVE BIO SURGEON STRL SZ 6.5 (GLOVE) ×6 IMPLANT
GLOVE BIOGEL PI IND STRL 6.5 (GLOVE) ×2 IMPLANT
GLOVE BIOGEL PI IND STRL 7.0 (GLOVE) ×8 IMPLANT
GLOVE BIOGEL PI INDICATOR 6.5 (GLOVE) ×1
GLOVE BIOGEL PI INDICATOR 7.0 (GLOVE) ×4
GOWN STRL REUS W/TWL LRG LVL3 (GOWN DISPOSABLE) ×9 IMPLANT
NDL SAFETY ECLIPSE 18X1.5 (NEEDLE) IMPLANT
NEEDLE HYPO 18GX1.5 SHARP (NEEDLE)
NEEDLE HYPO 22GX1.5 SAFETY (NEEDLE) IMPLANT
NEEDLE INSUFFLATION 120MM (ENDOMECHANICALS) ×3 IMPLANT
NS IRRIG 1000ML POUR BTL (IV SOLUTION) ×3 IMPLANT
PACK LAPAROSCOPY BASIN (CUSTOM PROCEDURE TRAY) ×3 IMPLANT
PACK TRENDGUARD 450 HYBRID PRO (MISCELLANEOUS) ×2 IMPLANT
PACK TRENDGUARD 600 HYBRD PROC (MISCELLANEOUS) IMPLANT
PACK VAGINAL MINOR WOMEN LF (CUSTOM PROCEDURE TRAY) IMPLANT
PACK VAGINAL WOMENS (CUSTOM PROCEDURE TRAY) ×3 IMPLANT
PAD OB MATERNITY 4.3X12.25 (PERSONAL CARE ITEMS) ×3 IMPLANT
PAD PREP 24X48 CUFFED NSTRL (MISCELLANEOUS) ×3 IMPLANT
PROTECTOR NERVE ULNAR (MISCELLANEOUS) ×3 IMPLANT
SHEARS HARMONIC ACE PLUS 36CM (ENDOMECHANICALS) ×3 IMPLANT
SLEEVE XCEL OPT CAN 5 100 (ENDOMECHANICALS) ×3 IMPLANT
STRIP CLOSURE SKIN 1/4X4 (GAUZE/BANDAGES/DRESSINGS) ×3 IMPLANT
SUT CHROMIC 0 SH (SUTURE) IMPLANT
SUT CHROMIC 2 0 SH (SUTURE) IMPLANT
SUT VIC AB 0 CT1 27 (SUTURE)
SUT VIC AB 0 CT1 27XBRD ANBCTR (SUTURE) IMPLANT
SUT VIC AB 2-0 CT1 27 (SUTURE)
SUT VIC AB 2-0 CT1 TAPERPNT 27 (SUTURE) IMPLANT
SUT VIC AB 3-0 CT1 27 (SUTURE) ×4
SUT VIC AB 3-0 CT1 TAPERPNT 27 (SUTURE) ×8 IMPLANT
SUT VICRYL 0 UR6 27IN ABS (SUTURE) ×3 IMPLANT
SUT VICRYL 4-0 PS2 18IN ABS (SUTURE) ×6 IMPLANT
SYR 30ML LL (SYRINGE) IMPLANT
TOWEL OR 17X24 6PK STRL BLUE (TOWEL DISPOSABLE) ×9 IMPLANT
TRAY FOLEY CATH SILVER 14FR (SET/KITS/TRAYS/PACK) ×3 IMPLANT
TRENDGUARD 450 HYBRID PRO PACK (MISCELLANEOUS) ×3
TRENDGUARD 600 HYBRID PROC PK (MISCELLANEOUS)
TROCAR OPTI TIP 5M 100M (ENDOMECHANICALS) ×3 IMPLANT
TROCAR XCEL DIL TIP R 11M (ENDOMECHANICALS) ×3 IMPLANT
WARMER LAPAROSCOPE (MISCELLANEOUS) ×3 IMPLANT
WATER STERILE IRR 1000ML POUR (IV SOLUTION) ×3 IMPLANT

## 2016-08-28 NOTE — Anesthesia Preprocedure Evaluation (Signed)
Anesthesia Evaluation  Patient identified by MRN, date of birth, ID band Patient awake    History of Anesthesia Complications (+) PONV and history of anesthetic complications  Airway Mallampati: II       Dental no notable dental hx. (+) Teeth Intact   Pulmonary Current Smoker,    Pulmonary exam normal breath sounds clear to auscultation       Cardiovascular hypertension, Normal cardiovascular exam Rhythm:Regular Rate:Normal     Neuro/Psych  Headaches, PSYCHIATRIC DISORDERS Depression  Neuromuscular disease    GI/Hepatic negative GI ROS, Neg liver ROS,   Endo/Other  diabetes, Gestational  Renal/GU Renal diseaseHx/o glomerulonephritis   cystocele    Musculoskeletal negative musculoskeletal ROS (+)   Abdominal (+) + obese,   Peds  Hematology  (+) anemia ,   Anesthesia Other Findings   Reproductive/Obstetrics Desires sterilization                             Anesthesia Physical Anesthesia Plan  ASA: II  Anesthesia Plan: General   Post-op Pain Management:    Induction: Intravenous, Cricoid pressure planned and Rapid sequence  Airway Management Planned: Oral ETT  Additional Equipment:   Intra-op Plan:   Post-operative Plan: Extubation in OR  Informed Consent: I have reviewed the patients History and Physical, chart, labs and discussed the procedure including the risks, benefits and alternatives for the proposed anesthesia with the patient or authorized representative who has indicated his/her understanding and acceptance.   Dental advisory given  Plan Discussed with: Anesthesiologist, CRNA and Surgeon  Anesthesia Plan Comments:         Anesthesia Quick Evaluation

## 2016-08-28 NOTE — Discharge Instructions (Signed)
DISCHARGE INSTRUCTIONS: Laparoscopy  The following instructions have been prepared to help you care for yourself upon your return home today.  Wound care:  Do not get the incision wet for the first 24 hours. The incision should be kept clean and dry.  The Band-Aids or dressings may be removed the day after surgery.  Should the incision become sore, red, and swollen after the first week, check with your doctor.  Personal hygiene:  Shower the day after your procedure.  Activity and limitations:  Do NOT drive or operate any equipment today.  Do NOT lift anything more than 15 pounds for 2-3 weeks after surgery.  Do NOT rest in bed all day.  Walking is encouraged. Walk each day, starting slowly with 5-minute walks 3 or 4 times a day. Slowly increase the length of your walks.  Walk up and down stairs slowly.  Do NOT do strenuous activities, such as golfing, playing tennis, bowling, running, biking, weight lifting, gardening, mowing, or vacuuming for 2-4 weeks. Ask your doctor when it is okay to start.  Diet: Eat a light meal as desired this evening. You may resume your usual diet tomorrow.  Return to work: This is dependent on the type of work you do. For the most part you can return to a desk job within a week of surgery. If you are more active at work, please discuss this with your doctor.  What to expect after your surgery: You may have a slight burning sensation when you urinate on the first day. You may have a very small amount of blood in the urine. Expect to have a small amount of vaginal discharge/light bleeding for 1-2 weeks. It is not unusual to have abdominal soreness and bruising for up to 2 weeks. You may be tired and need more rest for about 1 week. You may experience shoulder pain for 24-72 hours. Lying flat in bed may relieve it.  Call your doctor for any of the following:  Develop a fever of 100.4 or greater  Inability to urinate 6 hours after discharge from  hospital  Severe pain not relieved by pain medications  Persistent of heavy bleeding at incision site  Redness or swelling around incision site after a week  Increasing nausea or vomiting  Patient Signature________________________________________ Nurse Signature_________________________________________Indwelling Urinary Catheter Care, Adult Take good care of your catheter to keep it working and to prevent problems. How to wear your catheter Attach your catheter to your leg with tape (adhesive tape) or a leg strap. Make sure it is not too tight. If you use tape, remove any bits of tape that are already on the catheter. How to wear a drainage bag You should have:  A large overnight bag.  A small leg bag. Overnight Bag  You may wear the overnight bag at any time. Always keep the bag below the level of your bladder but off the floor. When you sleep, put a clean plastic bag in a wastebasket. Then hang the bag inside the wastebasket. Leg Bag  Never wear the leg bag at night. Always wear the leg bag below your knee. Keep the leg bag secure with a leg strap or tape. How to care for your skin  Clean the skin around the catheter at least once every day.  Shower every day. Do not take baths.  Put creams, lotions, or ointments on your genital area only as told by your doctor.  Do not use powders, sprays, or lotions on your genital area. How  to clean your catheter and your skin 1. Wash your hands with soap and water. 2. Wet a washcloth in warm water and gentle (mild) soap. 3. Use the washcloth to clean the skin where the catheter enters your body. Clean downward and wipe away from the catheter in small circles. Do not wipe toward the catheter. 4. Pat the area dry with a clean towel. Make sure to clean off all soap. How to care for your drainage bags Empty your drainage bag when it is ?- full or at least 2-3 times a day. Replace your drainage bag once a month or sooner if it starts to  smell bad or look dirty. Do not clean your drainage bag unless told by your doctor. Emptying a drainage bag   Supplies Needed  Rubbing alcohol.  Gauze pad or cotton ball.  Tape or a leg strap. Steps 1. Wash your hands with soap and water. 2. Separate (detach) the bag from your leg. 3. Hold the bag over the toilet or a clean container. Keep the bag below your hips and bladder. This stops pee (urine) from going back into the tube. 4. Open the pour spout at the bottom of the bag. 5. Empty the pee into the toilet or container. Do not let the pour spout touch any surface. 6. Put rubbing alcohol on a gauze pad or cotton ball. 7. Use the gauze pad or cotton ball to clean the pour spout. 8. Close the pour spout. 9. Attach the bag to your leg with tape or a leg strap. 10. Wash your hands. Changing a drainage bag  Supplies Needed  Alcohol wipes.  A clean drainage bag.  Adhesive tape or a leg strap. Steps 1. Wash your hands with soap and water. 2. Separate the dirty bag from your leg. 3. Pinch the rubber catheter with your fingers so that pee does not spill out. 4. Separate the catheter tube from the drainage tube where these tubes connect (at the connection valve). Do not let the tubes touch any surface. 5. Clean the end of the catheter tube with an alcohol wipe. Use a different alcohol wipe to clean the end of the drainage tube. 6. Connect the catheter tube to the drainage tube of the clean bag. 7. Attach the new bag to the leg with adhesive tape or a leg strap. 8. Wash your hands. How to prevent infection and other problems  Never pull on your catheter or try to remove it. Pulling can damage tissue in your body.  Always wash your hands before and after touching your catheter.  If a leg strap gets wet, replace it with a dry one.  Drink enough fluids to keep your pee clear or pale yellow, or as told by your doctor.  Do not let the drainage bag or tubing touch the floor.  Wear  cotton underwear.  If you are female, wipe from front to back after you poop (have a bowel movement).  Check on the catheter often to make sure it works and the tubing is not twisted. Get help if:  Your pee is cloudy.  Your pee smells unusually bad.  Your pee is not draining into the bag.  Your tube gets clogged.  Your catheter starts to leak.  Your bladder feels full. Get help right away if:  You have redness, swelling, or pain where the catheter enters your body.  You have fluid, pus, or a bad smell coming from the area where the catheter enters your body.  The area where the catheter enters your body feels warm.  You have a fever.  You have pain in your:  Stomach (abdomen).  Legs.  Lower back.  Bladder.  You see blood fill the catheter.  Your pee is pink or red.  You feel sick to your stomach (nauseous).  You throw up (vomit).  You have chills.  Your catheter gets pulled out. This information is not intended to replace advice given to you by your health care provider. Make sure you discuss any questions you have with your health care provider. Document Released: 10/13/2012 Document Revised: 05/16/2016 Document Reviewed: 12/01/2013 Elsevier Interactive Patient Education  2017 [employer].

## 2016-08-28 NOTE — OR Nursing (Signed)
Inserted Mirena system, lot # [id number],  Expiration 06/20

## 2016-08-28 NOTE — H&P (Signed)
[name] is an 36 y.o. MWP4 here today for her anterior repair, removal of oviducts for permanent sterility, replacement of her Mirena for menstrual regulation.   Menstrual History: Menarche age: 47 Patient's last menstrual period was 08/19/2016 (exact date).    Past Medical History:  Diagnosis Date  . Anemia    with pregnancy  . Depression    PPD  . Gestational diabetes    with 1st pregnancy  . Glomerulonephritis   . Hypertension   . Kidney infection   . Migraine   . PONV (postoperative nausea and vomiting)    crying  . Urinary tract infection     Past Surgical History:  Procedure Laterality Date  . CHOLECYSTECTOMY N/A 11/2013  . DILATION AND EVACUATION  03/09/2011   Procedure: DILATATION AND EVACUATION (D&E);  Surgeon: Donnamae Jude, MD;  Location: [facility];  Service: Gynecology;  Laterality: N/A;  . TONSILLECTOMY  1988  . Tubes in ears  Baby    Family History  Problem Relation Age of Onset  . Hypertension Father   . Heart failure Father   . Hepatitis Father   . Cirrhosis Father   . Hyperlipidemia Father   . Migraines Father   . Mental illness Father     Depression  . Diabetes Mother   . Osteoarthritis Mother   . Hypertension Mother   . Hypothyroidism Mother   . Heart disease Mother   . Hyperlipidemia Mother   . Migraines Mother   . Mental illness Mother     Depression  . Breast cancer Maternal Aunt 70  . Cancer Sister 20    Cervical  . Heart disease Sister     Heart Stops  . Migraines Sister   . Seizures Sister 11    unsure if this or heart problem  . Mental illness Sister     depression  . Heart disease Maternal Grandmother     Social History:  reports that she has been smoking Cigarettes.  She has a 8.00 pack-year smoking history. She has never used smokeless tobacco. She reports that she does not drink alcohol or use drugs.  Allergies: No Known Allergies  Prescriptions Prior to Admission  Medication Sig Dispense Refill Last Dose  .  atenolol (TENORMIN) 25 MG tablet Take 25 mg by mouth daily.   08/28/2016 at 0700  . hydrochlorothiazide (HYDRODIURIL) 25 MG tablet Take 25 mg by mouth daily.   08/28/2016 at 0700  . ibuprofen (ADVIL,MOTRIN) 200 MG tablet Take 400-800 mg by mouth every 6 (six) hours as needed for mild pain or moderate pain.   08/27/2016 at Unknown time  . sertraline (ZOLOFT) 50 MG tablet Take 1 tablet (50 mg total) by mouth daily. 30 tablet 4 08/28/2016 at 0700    ROS  Works at [employer], heavy lifting  Blood pressure (!) 122/95, pulse 66, temperature 98 F (36.7 C), temperature source Oral, resp. rate 18, last menstrual period 08/19/2016, SpO2 99 %, not currently breastfeeding. Physical Exam  Heart- rrr Lungs- CTAB Abd- benign 3rd degree cystocle  Results for orders placed or performed during the hospital encounter of 08/28/16 (from the past 24 hour(s))  Pregnancy, urine     Status: None   Collection Time: 08/28/16 11:45 AM  Result Value Ref Range   Preg Test, Ur NEGATIVE NEGATIVE    No results found.  Assessment/Plan: Menstrual regulation- mirena Cystocele- anterior repair Desire for permanent sterility- plan for laparoscopic BS.  She understands the risks of surgery, including, but not  to infection, bleeding, DVTs, damage to bowel, bladder, ureters. She wishes to proceed.     Emily Filbert 08/28/2016, 12:43 PM

## 2016-08-28 NOTE — Transfer of Care (Signed)
Immediate Anesthesia Transfer of Care Note  Patient: [name]  Procedure(s) Performed: Procedure(s): ANTERIOR REPAIR (CYSTOCELE) (N/A) PERINEOPLASTY (N/A) LAPAROSCOPIC BILATERAL SALPINGECTOMY (Bilateral)  Patient Location: PACU  Anesthesia Type:General  Level of Consciousness: awake  Airway & Oxygen Therapy: Patient Spontanous Breathing and Patient connected to nasal cannula oxygen  Post-op Assessment: Report given to RN and Post -op Vital signs reviewed and stable  Post vital signs: stable  Last Vitals:  Vitals:   08/28/16 1229  BP: (!) 122/95  Pulse: 66  Resp: 18  Temp: 36.7 C    Last Pain:  Vitals:   08/28/16 1229  TempSrc: Oral      Patients Stated Pain Goal: 3 ([id number])  Complications: No apparent anesthesia complications

## 2016-08-28 NOTE — Anesthesia Postprocedure Evaluation (Signed)
Anesthesia Post Note  Patient: Parrish Rahimi [id number]  Procedure(s) Performed: Procedure(s) (LRB): ANTERIOR REPAIR (CYSTOCELE) (N/A) PERINEOPLASTY (N/A) LAPAROSCOPIC BILATERAL SALPINGECTOMY (Bilateral)  Patient location during evaluation: PACU Anesthesia Type: General Level of consciousness: awake and alert and oriented Pain management: pain level controlled Vital Signs Assessment: post-procedure vital signs reviewed and stable Respiratory status: spontaneous breathing, nonlabored ventilation and respiratory function stable Cardiovascular status: blood pressure returned to baseline and stable Postop Assessment: no signs of nausea or vomiting Anesthetic complications: no        Last Vitals:  Vitals:   08/28/16 1521 08/28/16 1530  BP:  126/78  Pulse: (!) 59 62  Resp: 14 13  Temp: 36.7 C     Last Pain:  Vitals:   08/28/16 1521  TempSrc: Oral   Pain Goal: Patients Stated Pain Goal: 3 (08/28/16 1229)               Ronniesha Seibold A.

## 2016-08-28 NOTE — Anesthesia Procedure Notes (Signed)
Procedure Name: Intubation Date/Time: 08/28/2016 1:09 PM Performed by: Ignacia Bayley Pre-anesthesia Checklist: Patient identified, Emergency Drugs available, Suction available and Patient being monitored Patient Re-evaluated:Patient Re-evaluated prior to inductionOxygen Delivery Method: Circle system utilized Preoxygenation: Pre-oxygenation with 100% oxygen Intubation Type: IV induction, Rapid sequence and Cricoid Pressure applied Laryngoscope Size: Miller and 2 Grade View: Grade II Tube type: Oral Tube size: 7.0 mm Number of attempts: 1 Airway Equipment and Method: Stylet Placement Confirmation: ETT inserted through vocal cords under direct vision,  positive ETCO2 and breath sounds checked- equal and bilateral Secured at: 21 cm Tube secured with: Tape Dental Injury: Teeth and Oropharynx as per pre-operative assessment

## 2016-08-28 NOTE — Op Note (Addendum)
08/28/2016  2:16 PM  PATIENT:  [name]  36 y.o. female  PRE-OPERATIVE DIAGNOSIS:  cpt 57240 and [phone] - Symptomatic cystocele, undesired fertility, DUB  POST-OPERATIVE DIAGNOSIS:  same  PROCEDURE:  Procedure(s): ANTERIOR REPAIR (CYSTOCELE) (N/A) PERINEOPLASTY (N/A) LAPAROSCOPIC BILATERAL SALPINGECTOMY (Bilateral)  SURGEON:  Surgeon(s) and Role:    * Emily Filbert, MD - Primary    * Osborne Oman, MD - Assisting   ANESTHESIA:   general  EBL:  Total I/O In: 1000 [I.V.:1000] Out: 270 [Urine:250; Blood:20]  BLOOD ADMINISTERED:none  DRAINS: none   LOCAL MEDICATIONS USED:  MARCAINE     SPECIMEN:  Source of Specimen:  both oviducts  DISPOSITION OF SPECIMEN:  PATHOLOGY  COUNTS:  YES  TOURNIQUET:  * No tourniquets in log *  DICTATION: .Dragon Dictation  PLAN OF CARE: Discharge to home after PACU  PATIENT DISPOSITION:  PACU - hemodynamically stable.   Delay start of Pharmacological VTE agent (>24hrs) due to surgical blood loss or risk of bleeding: not applicable  The risks, benefits, and alternatives of surgery were explained, understood, accepted. She is certain that she wants permanent sterility. She understands that this is not reversible. She understands there is a small failure rate of this procedure. She also would like to have both oviducts removed her due newest recommendations to help prevent ovarian/peritoneal cancer. In the operating room she was placed in the dorsal lithotomy position, and general anesthesia was given without complication. Her abdomen and vagina were prepped and draped in the usual sterile fashion. A timeout procedure was done. A bimanual exam revealed a small anteverted and mobile uterus. Her adnexa felt normal. A Hulka manipulator was placed. Her bladder was emptied with a Foley catheter. Gloves were changed, and attention was turned to the abdomen. Approximately the 5 mL of 0.5% Marcaine was injected into the umbilicus. A vertical  incision was made at the site. A varies needle was placed intraperitoneally. Low-flow CO2 was used to insufflate the abdomen to approximately 3-1/2 L. Once a good pneumoperitoneum was established, a 5 mm Excel trocar was placed. Laparoscopy confirmed correct placement. A 5 mm port was placed in each lower quadrant and a 10 mm port in the righ under direct laparoscopic visualization after injecting 0.5% Marcaine in the incision sites. Her pelvis and upper abdomen appeared normal. A Harmonic scapel was used to hemostatically removed both oviducts. I removed the oviducts through the 5 mm port. I removed the 5 mm ports and noted hemostasis. No defects were palpable. A subcuticular closure was done with 4-0 Vicryl suture at all incision sites. A Steri-Strip was placed across each incision.  I then proceeded with the anterior repair. I injected about 50 cc of dilute marcaine solution. I then made an incision at the anterior lip of the cervix. She was extubated and taken to the recovery room in stable condition.

## 2016-08-29 ENCOUNTER — Encounter (HOSPITAL_COMMUNITY): Payer: Self-pay | Admitting: Obstetrics & Gynecology

## 2016-08-29 ENCOUNTER — Other Ambulatory Visit: Payer: Self-pay | Admitting: Obstetrics and Gynecology

## 2016-08-29 MED ORDER — OXYCODONE HCL 5 MG PO TABS: 5.0000 mg | ORAL_TABLET | Freq: Once | ORAL | 0 refills | Status: DC | PRN

## 2016-08-29 NOTE — Progress Notes (Signed)
Patient called and she got the wrong Rx for her narcotics. Will re-write from yesterday and pt to bring in old Rx.

## 2016-09-02 ENCOUNTER — Inpatient Hospital Stay (HOSPITAL_COMMUNITY)
Admission: AD | Admit: 2016-09-02 | Discharge: 2016-09-02 | Disposition: A | Discharge status: Home / Self Care | Payer: 59 | Source: Ambulatory Visit | Attending: Obstetrics and Gynecology | Admitting: Obstetrics and Gynecology

## 2016-09-02 ENCOUNTER — Encounter (HOSPITAL_COMMUNITY): Payer: Self-pay

## 2016-09-02 DIAGNOSIS — N9989 Other postprocedural complications and disorders of genitourinary system: Secondary | ICD-10-CM

## 2016-09-02 DIAGNOSIS — Z79899 Other long term (current) drug therapy: Secondary | ICD-10-CM | POA: Insufficient documentation

## 2016-09-02 DIAGNOSIS — R339 Retention of urine, unspecified: Secondary | ICD-10-CM | POA: Insufficient documentation

## 2016-09-02 DIAGNOSIS — F17210 Nicotine dependence, cigarettes, uncomplicated: Secondary | ICD-10-CM | POA: Diagnosis not present

## 2016-09-02 DIAGNOSIS — Z9889 Other specified postprocedural states: Secondary | ICD-10-CM | POA: Insufficient documentation

## 2016-09-02 DIAGNOSIS — R338 Other retention of urine: Secondary | ICD-10-CM

## 2016-09-02 NOTE — Discharge Instructions (Signed)
Indwelling Urinary Catheter Care, Adult Taking good care of your catheter will keep it working properly and help prevent problems from developing. How to wear your catheter Attach your catheter to your leg with adhesive tape or a leg strap. Make sure there is no tension on the catheter. If you use adhesive tape, first remove any sticky residue from the previous tape you used. How to wear a drainage bag  You should have received a large overnight drainage bag and a smaller leg bag that fits underneath clothing. You may wear the overnight bag at any time, but you should never wear the smaller leg bag at night.  Always keep the overnight drainage bag below the level of your bladder, but keep it off the floor. When you sleep, hang the bag inside a wastebasket that is covered by a clean plastic bag.  Always wear the leg bag below your knee. Keep the leg bag secure with a leg strap or adhesive tape. How to care for your skin  Clean the skin around the catheter at least once every day.  Shower every day. Do not take baths.  Apply creams, lotions, or ointments to your genital area only as told by your health care provider.  Do not use powders, sprays, or lotions on your genital area. How to clean your catheter and your skin 1. Wash your hands with soap and water. 2. Wet a washcloth in warm water and mild soap. 3. Use the washcloth to clean the skin where the catheter enters your body. Clean downward, wiping away from the catheter in small circles. Do not wipe toward the catheter. This can push bacteria into the urethra and cause infection. 4. Pat the area dry with a clean towel. Make sure to remove all soap. How to care for your drainage bags Empty your drainage bag when it is ?- full, or at least 2-3 times a day. Replace your drainage bag once a month or sooner if it starts to smell bad or look dirty. Do not clean your drainage bag unless told by your health care provider. Emptying a drainage  bag   Supplies Needed  Rubbing alcohol.  Gauze pad or cotton ball.  Adhesive tape or a leg strap. Steps 1. Wash your hands with soap and water. 2. Detach the drainage bag from your leg. 3. Hold the drainage bag over the toilet or a clean container. Keep the drainage bag below your hips and bladder. This stops urine from going back into the tubing and into your bladder. 4. Open the pour spout at the bottom of the bag. 5. Empty the urine into the toilet or container. Do not let the pour spout touch any surface. This helps keep bacteria out of the bag and helps prevent infection. 6. Apply rubbing alcohol to a gauze pad or cotton ball. 7. Use the gauze pad or cotton ball to clean the pour spout. 8. Close the pour spout. 9. Attach the bag to your leg with adhesive tape or a leg strap. 10. Wash your hands. Changing a drainage bag  Supplies Needed  Alcohol wipes.  A clean drainage bag.  Adhesive tape or a leg strap. Steps 1. Wash your hands with soap and water. 2. Detach the dirty drainage bag from your leg. 3. Pinch the rubber catheter with your fingers so that urine does not spill out. 4. Disconnect the catheter tube from the drainage tube at the connection valve. Do not let the tubes touch any surface. 5. Clean  with soap and water.  2. Detach the dirty drainage bag from your leg.  3. Pinch the rubber catheter with your fingers so that urine does not spill out.  4. Disconnect the catheter tube from the drainage tube at the connection valve. Do not let the tubes touch any surface.  5. Clean the end of the catheter tube with an alcohol wipe. Use a different alcohol wipe to clean the end of the drainage tube.  6. Connect the catheter tube to the drainage tube of the clean drainage bag.  7. Attach the new bag to your leg with adhesive tape or a leg strap. Avoid attaching the new bag too tightly.  8. Wash your hands.     How to prevent infection and other problems  · Never pull on your catheter or try to remove it. Pulling can damage your internal tissues.  · Always wash your hands before and after handling your catheter.  · If a leg strap gets wet, replace it with a dry one.  · Drink enough fluids to keep your urine clear or pale yellow, or as told by your  health care provider.  · Do not let the drainage bag or tubing touch the floor.  · Wear cotton underwear. Cotton absorbs moisture and keeps your skin dry.  · If you are female, wipe from front to back after each bowel movement.  · Check the catheter often to make sure that it works properly and the tubing is not twisted or curled.  Contact a health care provider if:  · Your urine is cloudy.  · Your urine smells unusually bad.  · Your urine is not draining into the bag.  · Your catheter gets clogged.  · Your catheter starts to leak.  · Your bladder feels full.  Get help right away if:  · You have redness, swelling, or pain where the catheter enters your body.  · You have fluid, pus, or a bad smell coming from the area where the catheter enters your body.  · The area where the catheter enters your body feels warm to the touch.  · You have a fever.  · You have pain in your abdomen, legs, lower back, or bladder.  · You see blood fill the catheter.  · Your urine is pink or red.  · You have nausea, vomiting, or chills.  · Your catheter gets pulled out.  This information is not intended to replace advice given to you by your health care provider. Make sure you discuss any questions you have with your health care provider.  Document Released: 06/18/2005 Document Revised: 05/16/2016 Document Reviewed: 12/01/2013  Elsevier Interactive Patient Education © 2017 [employer]   

## 2016-09-02 NOTE — MAU Note (Signed)
Patient post surgery on Tuesday had catheter in place, took it out this morning was able to urinate initially, now unable to urinate, painful.

## 2016-09-02 NOTE — MAU Note (Signed)
Bladder scanned patient. 971ml noted. Provider Manya Silvas CNM notified.

## 2016-09-02 NOTE — MAU Provider Note (Signed)
Chief Complaint: Urinary Retention   First Provider Initiated Contact with Patient 09/02/16 1832     SUBJECTIVE HPI: [name] is a 36 y.o. [id number] at 5 days post-op Anterior repair and bilat salpingectomy who presents [facility] reporting abd pain since this afternoon and being unable to void adequately since this morning. Removed foley cath this morning and was able to void a scant amount, but none since.   Location: suprapubic Quality: sharp pressure Severity: Severe Duration: <12 hours Context: Post-op after removing catheter Timing: Constant Course: Worsening Modifying factors: None. Tries to void, but was not able Associated signs and symptoms: Neg for fever, chills, VB, dysuria, flank pain, N/V/D/C  Past Medical History:  Diagnosis Date  . Anemia    with pregnancy  . Depression    PPD  . Gestational diabetes    with 1st pregnancy  . Glomerulonephritis   . Hypertension   . Kidney infection   . Migraine   . PONV (postoperative nausea and vomiting)    crying  . Urinary tract infection    OB History  Gravida Para Term Preterm AB Living  7 4 4  0 3 4  SAB TAB Ectopic Multiple Live Births  1 2 0 0 4    # Outcome Date GA Lbr Len/2nd Weight Sex Delivery Anes PTL Lv  7 Term 04/28/16 [redacted]w[redacted]d 07:10 / 00:22 6 lb 8.2 oz (2.955 kg) M Vag-Spont EPI  LIV  6 Term 03/01/13 [redacted]w[redacted]d 05:51 / 00:08 7 lb 2.3 oz (3.24 kg) M Vag-Spont EPI  LIV  5 Term 07/28/08 [redacted]w[redacted]d 10:00 6 lb 13 oz (3.09 kg) M Vag-Spont EPI N LIV  4 Term 10/07/06 [redacted]w[redacted]d 05:00 6 lb 2 oz (2.778 kg) M Vag-Spont EPI N LIV  3 TAB 2003          2 TAB 2002          1 SAB              Birth Comments: D & C     Past Surgical History:  Procedure Laterality Date  . CHOLECYSTECTOMY N/A 11/2013  . CYSTOCELE REPAIR N/A 08/28/2016   Procedure: ANTERIOR REPAIR (CYSTOCELE);  Surgeon: Emily Filbert, MD;  Location: [facility];  Service: Gynecology;  Laterality: N/A;  . DILATION AND EVACUATION  03/09/2011   Procedure:  DILATATION AND EVACUATION (D&E);  Surgeon: Donnamae Jude, MD;  Location: [facility];  Service: Gynecology;  Laterality: N/A;  . LAPAROSCOPIC BILATERAL SALPINGECTOMY Bilateral 08/28/2016   Procedure: LAPAROSCOPIC BILATERAL SALPINGECTOMY;  Surgeon: Emily Filbert, MD;  Location: [facility];  Service: Gynecology;  Laterality: Bilateral;  . PERINEOPLASTY N/A 08/28/2016   Procedure: PERINEOPLASTY;  Surgeon: Emily Filbert, MD;  Location: [facility];  Service: Gynecology;  Laterality: N/A;  . TONSILLECTOMY  1988  . Tubes in ears  Baby   Social History   Social History  . Marital status: Married    Spouse name: N/A  . Number of children: N/A  . Years of education: N/A   Occupational History  . Not on file.   Social History Main Topics  . Smoking status: Current Every Day Smoker    Packs/day: 0.50    Years: 16.00    Types: Cigarettes  . Smokeless tobacco: Never Used  . Alcohol use No     Comment: prior to pregnancy  . Drug use: No  . Sexual activity: Not Currently    Birth control/ protection: None   Other Topics Concern  .  Not on file   Social History Narrative  . No narrative on file   Family History  Problem Relation Age of Onset  . Hypertension Father   . Heart failure Father   . Hepatitis Father   . Cirrhosis Father   . Hyperlipidemia Father   . Migraines Father   . Mental illness Father     Depression  . Diabetes Mother   . Osteoarthritis Mother   . Hypertension Mother   . Hypothyroidism Mother   . Heart disease Mother   . Hyperlipidemia Mother   . Migraines Mother   . Mental illness Mother     Depression  . Breast cancer Maternal Aunt 70  . Cancer Sister 48    Cervical  . Heart disease Sister     Heart Stops  . Migraines Sister   . Seizures Sister 11    unsure if this or heart problem  . Mental illness Sister     depression  . Heart disease Maternal Grandmother    Current Facility-Administered Medications on File Prior to Encounter  Medication Dose Route Frequency  Provider Last Rate Last Dose  . fentaNYL (SUBLIMAZE) injection 100 mcg  100 mcg Intravenous Once PRN Duane Boston, MD      . midazolam (VERSED) injection 1 mg  1 mg Intravenous Once Duane Boston, MD       Current Outpatient Prescriptions on File Prior to Encounter  Medication Sig Dispense Refill  . atenolol (TENORMIN) 25 MG tablet Take 25 mg by mouth daily.    . flavoxATE (URISPAS) 100 MG tablet Take 1 tablet (100 mg total) by mouth 3 (three) times daily as needed for bladder spasms (bladder spasm). 10 tablet 1  . hydrochlorothiazide (HYDRODIURIL) 25 MG tablet Take 25 mg by mouth daily.    Marland Kitchen ibuprofen (ADVIL,MOTRIN) 200 MG tablet Take 400-800 mg by mouth every 6 (six) hours as needed for mild pain or moderate pain.    Marland Kitchen ibuprofen (ADVIL,MOTRIN) 600 MG tablet Take 1 tablet (600 mg total) by mouth every 6 (six) hours as needed. 30 tablet 1  . oxyCODONE (OXY IR/ROXICODONE) 5 MG immediate release tablet Take 1 tablet (5 mg total) by mouth once as needed (for pain score of 1-4). 30 tablet 0  . sertraline (ZOLOFT) 50 MG tablet Take 1 tablet (50 mg total) by mouth daily. 30 tablet 4   No Known Allergies  I have reviewed patient's Past Medical Hx, Surgical Hx, Family Hx, Social Hx, medications and allergies.   Review of Systems  Constitutional: Negative for appetite change, chills and fever.  Gastrointestinal: Positive for abdominal pain. Negative for constipation, diarrhea, nausea and vomiting.  Genitourinary: Negative for dysuria, frequency, hematuria, urgency and vaginal bleeding.    OBJECTIVE Patient Vitals for the past 24 hrs:  BP Temp Temp src Pulse Resp  09/02/16 1737 130/91 98.1 F (36.7 C) Oral 78 16   Constitutional: Well-developed, well-nourished female in moderate distress.  Cardiovascular: normal rate Respiratory: normal rate and effort.  GI: Abd soft, mild tenderness. ? Bladder palpated  Neurologic: Alert and oriented x 4.  GU: Neg CVAT.   LAB RESULTS No results found for  this or any previous visit (from the past 24 hour(s)).  IMAGING Bladder scanner showed>900 ml  MAU COURSE Orders Placed This Encounter  Procedures  . Bladder scan  . Insert urethral catheter Catheter type: foley; Size: 12; Balloon size: 10cc; Reason for Insertion: Acute urinary retention  . Discharge patient   Discussed Hx, labs, exam  w/ Dr. Rip Harbour. Agrees w/ POC. New orders: place foley cath.   Foley cath placed. Pain resolved.   MDM - Pain from urinary retention. No evidence of emergent post-op condition.   ASSESSMENT 1. Postoperative urinary retention     PLAN Discharge home in stable condition. Post-op precautions Leave catheter in place until F/U visit w/ Dr. Hulan Fray on 3/7. Follow-up [facility] on 09/05/2016.   Specialty:  Obstetrics and Gynecology Why:  with Dr. Hulan Fray at 1:00 PM for voiding trial. Remove the catheter that morning.  Contact information: [address] [place] [phone] [facility].   Why:  in emergencies Contact information: [address] [id number] [place] [phone]         Allergies as of 09/02/2016   No Known Allergies     Medication List    TAKE these medications   atenolol 25 MG tablet Commonly known as:  TENORMIN Take 25 mg by mouth daily.   flavoxATE 100 MG tablet Commonly known as:  URISPAS Take 1 tablet (100 mg total) by mouth 3 (three) times daily as needed for bladder spasms (bladder spasm).   hydrochlorothiazide 25 MG tablet Commonly known as:  HYDRODIURIL Take 25 mg by mouth daily.   ibuprofen 200 MG tablet Commonly known as:  ADVIL,MOTRIN Take 400-800 mg by mouth every 6 (six) hours as needed for mild pain or moderate pain.   ibuprofen 600 MG tablet Commonly known as:  ADVIL,MOTRIN Take 1 tablet (600 mg  total) by mouth every 6 (six) hours as needed.   oxyCODONE 5 MG immediate release tablet Commonly known as:  Oxy IR/ROXICODONE Take 1 tablet (5 mg total) by mouth once as needed (for pain score of 1-4).   sertraline 50 MG tablet Commonly known as:  ZOLOFT Take 1 tablet (50 mg total) by mouth daily.        [place], [place] 09/02/2016  7:33 PM

## 2016-09-03 ENCOUNTER — Encounter (HOSPITAL_COMMUNITY): Payer: Self-pay

## 2016-09-03 ENCOUNTER — Inpatient Hospital Stay (HOSPITAL_COMMUNITY)
Admission: AD | Admit: 2016-09-03 | Discharge: 2016-09-03 | Disposition: A | Discharge status: Home / Self Care | Payer: 59 | Source: Ambulatory Visit | Attending: Family Medicine | Admitting: Family Medicine

## 2016-09-03 DIAGNOSIS — N3289 Other specified disorders of bladder: Secondary | ICD-10-CM | POA: Diagnosis not present

## 2016-09-03 DIAGNOSIS — F17210 Nicotine dependence, cigarettes, uncomplicated: Secondary | ICD-10-CM | POA: Diagnosis not present

## 2016-09-03 DIAGNOSIS — I10 Essential (primary) hypertension: Secondary | ICD-10-CM | POA: Diagnosis not present

## 2016-09-03 DIAGNOSIS — R339 Retention of urine, unspecified: Secondary | ICD-10-CM

## 2016-09-03 DIAGNOSIS — R301 Vesical tenesmus: Secondary | ICD-10-CM

## 2016-09-03 LAB — URINALYSIS, ROUTINE W REFLEX MICROSCOPIC
Bilirubin Urine: NEGATIVE
Glucose, UA: NEGATIVE mg/dL
Hgb urine dipstick: NEGATIVE
Ketones, ur: NEGATIVE mg/dL
Leukocytes, UA: NEGATIVE
NITRITE: NEGATIVE
PROTEIN: NEGATIVE mg/dL
Specific Gravity, Urine: 1.005 (ref 1.005–1.030)
pH: 6 (ref 5.0–8.0)

## 2016-09-03 MED ORDER — LIDOCAINE HCL 2 % EX GEL
1.0000 "application " | Freq: Once | CUTANEOUS | Status: AC
  Administered 2016-09-03: 1 via URETHRAL
  Filled 2016-09-03: qty 5

## 2016-09-03 NOTE — MAU Note (Signed)
Pt had surgery and catheter left in after surgery. Was removed but was not able to urinate so reinserted last night. Now when she lays down it's not draining and causing pain. When she stands up some drains but her bladder still feels full and is leaking around catheter. She also sees blood when she wipes.

## 2016-09-03 NOTE — Progress Notes (Signed)
Leg bag attached.  

## 2016-09-03 NOTE — MAU Provider Note (Signed)
History     CSN: [id number]  Arrival date and time: 09/03/16 1007   None     Chief Complaint  Patient presents with  . Urinary catheter pain and leaking   HPI   [name] is a 36 y.o. female [phone] here in MAU with problems with her foley catheter.  She is 6 days post op anterior repair (cystocele)  and bilateral salpingectomy and mirena IUD placement. Procedure done by Dr. Hulan Fray.   She states that on she has had the foley from Tuesday- Sunday and has had no trouble with the catheter. She took the catheter out at home Sunday per Dr. orders and was only able to urinate "some". Later that day she tried to urinate and only was able to urinate small amounts. She came in and her bladder was full and they replaced her foley at that time.   Last night she woke up feeling like her bladder was full, like she had to urinate. The only time her urine would drain into the bag was when she stood up. "It over all feels uncomfortable in that area". "like I have to pee all the time".   She see's the urine leaking around the catheter, and feels urine leaking around the catheter.   Bladder scan shows 99 her in MAU.   OB History    Gravida Para Term Preterm AB Living   7 4 4  0 3 4   SAB TAB Ectopic Multiple Live Births   1 2 0 0 4      Past Medical History:  Diagnosis Date  . Anemia    with pregnancy  . Depression    PPD  . Gestational diabetes    with 1st pregnancy  . Glomerulonephritis   . Hypertension   . Kidney infection   . Migraine   . PONV (postoperative nausea and vomiting)    crying  . Urinary tract infection     Past Surgical History:  Procedure Laterality Date  . CHOLECYSTECTOMY N/A 11/2013  . CYSTOCELE REPAIR N/A 08/28/2016   Procedure: ANTERIOR REPAIR (CYSTOCELE);  Surgeon: Emily Filbert, MD;  Location: [facility];  Service: Gynecology;  Laterality: N/A;  . DILATION AND EVACUATION  03/09/2011   Procedure: DILATATION AND EVACUATION (D&E);  Surgeon: Donnamae Jude, MD;   Location: [facility];  Service: Gynecology;  Laterality: N/A;  . LAPAROSCOPIC BILATERAL SALPINGECTOMY Bilateral 08/28/2016   Procedure: LAPAROSCOPIC BILATERAL SALPINGECTOMY;  Surgeon: Emily Filbert, MD;  Location: [facility];  Service: Gynecology;  Laterality: Bilateral;  . PERINEOPLASTY N/A 08/28/2016   Procedure: PERINEOPLASTY;  Surgeon: Emily Filbert, MD;  Location: [facility];  Service: Gynecology;  Laterality: N/A;  . TONSILLECTOMY  1988  . Tubes in ears  Baby    Family History  Problem Relation Age of Onset  . Hypertension Father   . Heart failure Father   . Hepatitis Father   . Cirrhosis Father   . Hyperlipidemia Father   . Migraines Father   . Mental illness Father     Depression  . Diabetes Mother   . Osteoarthritis Mother   . Hypertension Mother   . Hypothyroidism Mother   . Heart disease Mother   . Hyperlipidemia Mother   . Migraines Mother   . Mental illness Mother     Depression  . Breast cancer Maternal Aunt 70  . Cancer Sister 49    Cervical  . Heart disease Sister     Heart Stops  . Migraines  Sister   . Seizures Sister 11    unsure if this or heart problem  . Mental illness Sister     depression  . Heart disease Maternal Grandmother     Social History  Substance Use Topics  . Smoking status: Current Every Day Smoker    Packs/day: 0.50    Years: 16.00    Types: Cigarettes  . Smokeless tobacco: Never Used  . Alcohol use No     Comment: prior to pregnancy    Allergies: No Known Allergies  Prescriptions Prior to Admission  Medication Sig Dispense Refill Last Dose  . atenolol (TENORMIN) 25 MG tablet Take 25 mg by mouth daily.   08/28/2016 at 0700  . flavoxATE (URISPAS) 100 MG tablet Take 1 tablet (100 mg total) by mouth 3 (three) times daily as needed for bladder spasms (bladder spasm). 10 tablet 1   . hydrochlorothiazide (HYDRODIURIL) 25 MG tablet Take 25 mg by mouth daily.   08/28/2016 at 0700  . ibuprofen (ADVIL,MOTRIN) 200 MG tablet Take 400-800 mg by mouth every  6 (six) hours as needed for mild pain or moderate pain.   08/27/2016 at Unknown time  . ibuprofen (ADVIL,MOTRIN) 600 MG tablet Take 1 tablet (600 mg total) by mouth every 6 (six) hours as needed. 30 tablet 1   . oxyCODONE (OXY IR/ROXICODONE) 5 MG immediate release tablet Take 1 tablet (5 mg total) by mouth once as needed (for pain score of 1-4). 30 tablet 0   . sertraline (ZOLOFT) 50 MG tablet Take 1 tablet (50 mg total) by mouth daily. 30 tablet 4 08/28/2016 at 0700   Results for orders placed or performed during the hospital encounter of 09/03/16 (from the past 48 hour(s))  Urinalysis, Routine w reflex microscopic     Status: Abnormal   Collection Time: 09/03/16 10:35 AM  Result Value Ref Range   Color, Urine COLORLESS (A) YELLOW   APPearance CLEAR CLEAR   Specific Gravity, Urine 1.005 1.005 - 1.030   pH 6.0 5.0 - 8.0   Glucose, UA NEGATIVE NEGATIVE mg/dL   Hgb urine dipstick NEGATIVE NEGATIVE   Bilirubin Urine NEGATIVE NEGATIVE   Ketones, ur NEGATIVE NEGATIVE mg/dL   Protein, ur NEGATIVE NEGATIVE mg/dL   Nitrite NEGATIVE NEGATIVE   Leukocytes, UA NEGATIVE NEGATIVE   Review of Systems  Constitutional: Negative for fever.  Genitourinary: Positive for urgency.  Musculoskeletal: Negative for back pain.   Physical Exam   Blood pressure 139/89, pulse 62, temperature 98 F (36.7 C), resp. rate 18, last menstrual period 08/19/2016, not currently breastfeeding.  Physical Exam  Constitutional: She is oriented to person, place, and time. She appears well-developed and well-nourished. No distress.  Musculoskeletal: Normal range of motion.  Neurological: She is alert and oriented to person, place, and time.  Skin: Skin is warm. She is not diaphoretic.  Psychiatric: Her behavior is normal.   MAU Course  Procedures  Foley Catheter 16 f.   MDM  UA sent.  Bladder scan shows 99 Foley catheter removed.  Discussed patient with Dr. Kennon Rounds. Will replace Foley with a 38 F.  Current Foley is  50 F.  Lidocaine Jelly applied. Patient tolerated foley well. Foley placed by myself.   Assessment and Plan   A:  1. Urinary retention   2. Painful bladder spasm     P:  Discharge home in stable condition Continue Urispas  Patient is scheduled to see Dr. Hulan Fray on 3/7: encouraged her to keep that appointment. Return to MAU  if continue to have pain.   Lezlie Lye, NP 09/03/2016 2:55 PM

## 2016-09-03 NOTE — MAU Note (Signed)
Urine in lab 

## 2016-09-03 NOTE — Discharge Instructions (Signed)
Acute Urinary Retention, Female Urinary retention means you are unable to pee completely or at all (empty your bladder). Follow these instructions at home:  Drink enough fluids to keep your pee (urine) clear or pale yellow.  If you are sent home with a tube that drains the bladder (catheter), there will be a drainage bag attached to it. There are two types of bags. One is big that you can wear at night without having to empty it. One is smaller and needs to be emptied more often.  Keep the drainage bag emptied.  Keep the drainage bag lower than the tube.  Only take medicine as told by your doctor. Contact a doctor if:  You have a low-grade fever.  You have spasms or you are leaking pee when you have spasms. Get help right away if:  You have chills or a fever.  Your catheter stops draining pee.  Your catheter falls out.  You have increased bleeding that does not stop after you have rested and increased the amount of fluids you had been drinking. This information is not intended to replace advice given to you by your health care provider. Make sure you discuss any questions you have with your health care provider. Document Released: 12/05/2007 Document Revised: 11/24/2015 Document Reviewed: 11/27/2012 Elsevier Interactive Patient Education  2017 Elsevier Inc.  

## 2016-09-05 ENCOUNTER — Encounter: Payer: 59 | Admitting: Obstetrics & Gynecology

## 2016-09-05 ENCOUNTER — Telehealth: Payer: Self-pay | Admitting: *Deleted

## 2016-09-05 NOTE — Telephone Encounter (Signed)
Pt called in stating she has urinary catheter in place but has started urinating on pad. She states she sees blood clots in urine bag. Explained to pt to go to MAU for eval. Pt expressed understanding.

## 2016-09-06 ENCOUNTER — Encounter: Payer: Self-pay | Admitting: Obstetrics & Gynecology

## 2016-09-06 ENCOUNTER — Telehealth: Payer: Self-pay | Admitting: *Deleted

## 2016-09-06 ENCOUNTER — Ambulatory Visit: Payer: 59

## 2016-09-06 ENCOUNTER — Ambulatory Visit: Payer: 59 | Admitting: Obstetrics & Gynecology

## 2016-09-06 VITALS — BP 118/85 | HR 66 | Ht 64.0 in | Wt 183.0 lb

## 2016-09-06 DIAGNOSIS — Z466 Encounter for fitting and adjustment of urinary device: Secondary | ICD-10-CM

## 2016-09-06 DIAGNOSIS — R339 Retention of urine, unspecified: Secondary | ICD-10-CM

## 2016-09-06 DIAGNOSIS — R300 Dysuria: Secondary | ICD-10-CM

## 2016-09-06 MED ORDER — SULFAMETHOXAZOLE-TRIMETHOPRIM 800-160 MG PO TABS: 1.0000 | ORAL_TABLET | Freq: Two times a day (BID) | ORAL | 2 refills | Status: DC

## 2016-09-06 MED ORDER — FLAVOXATE HCL 100 MG PO TABS: 100.0000 mg | ORAL_TABLET | Freq: Three times a day (TID) | ORAL | 1 refills | Status: DC | PRN

## 2016-09-06 MED ORDER — OXYCODONE HCL 5 MG PO TABS: 5.0000 mg | ORAL_TABLET | Freq: Once | ORAL | 0 refills | Status: DC | PRN

## 2016-09-06 NOTE — Addendum Note (Signed)
Addended by: Emily Filbert on: 09/06/2016 11:22 AM   Modules accepted: Orders

## 2016-09-06 NOTE — Progress Notes (Signed)
Pt here today for foley insertion requested by Dr. Hulan Fray.   Pt bladder scanned and 14 Fr foley inserted by Dr. Ilda Basset.  Pt tolerated well.   Pt advised to follow up with [facility] as requested by provider.  Pt stated understanding with no further questions.

## 2016-09-06 NOTE — Telephone Encounter (Signed)
Pharmacy called to verify frequency for Vicodin, verified she could take every 4 hrs as needed.

## 2016-09-06 NOTE — Progress Notes (Signed)
   Subjective:    Patient ID: [name], female    DOB: 11/14/1980, 36 y.o.   MRN: [id number]  HPI 36 yo MW P4 here today for a post catheter removal bladder check. She had an anterior repair and L/S BS on 08-13-16 (POD #10). She removed her catheter as instructed on about POD #5 but was unable to void, so another catheter was placed at the MAU. It was too small and she was leaking around the catheter. It was replaced with a larger one. She removed it yesterday and has been able to void but "not a large amount". She has tried standing in a warm shower. She is having dysuria.  Review of Systems     Objective:   Physical Exam Bedside u/s reveals a full bladder [id number] Breathing, conversing, and ambulating normally Vaginal mucosa/perineum healed beautifully       Assessment & Plan:  Dysuria- probably a UTI Check uc&s, prescribe bactrim ds and more urispas She will need another Foley We do not have one in stock and she will go to the [facility] for 14 [place] or larger Foley

## 2016-09-10 ENCOUNTER — Ambulatory Visit: Payer: 59

## 2016-09-11 ENCOUNTER — Encounter: Payer: Self-pay | Admitting: *Deleted

## 2016-09-11 ENCOUNTER — Ambulatory Visit (INDEPENDENT_AMBULATORY_CARE_PROVIDER_SITE_OTHER): Payer: 59 | Admitting: *Deleted

## 2016-09-11 DIAGNOSIS — R300 Dysuria: Secondary | ICD-10-CM

## 2016-09-11 DIAGNOSIS — Z9889 Other specified postprocedural states: Secondary | ICD-10-CM

## 2016-09-11 NOTE — Progress Notes (Signed)
Pt took out indwelling catheter yesterday, states she feels she is emptying her bladder much better this time and does not have any discomfort like she had before.  Pt emptied bladder here in office and post- residual amount = 113ml.  Informed pt to keep post-op appt and to call back if she starts to experience any problems with urination.

## 2016-10-04 ENCOUNTER — Ambulatory Visit: Payer: 59 | Admitting: Obstetrics & Gynecology

## 2016-10-04 ENCOUNTER — Encounter: Payer: Self-pay | Admitting: *Deleted

## 2016-10-04 ENCOUNTER — Encounter: Payer: Self-pay | Admitting: Obstetrics & Gynecology

## 2016-10-04 VITALS — BP 126/84 | HR 71 | Ht 64.0 in | Wt 184.0 lb

## 2016-10-04 DIAGNOSIS — Z9889 Other specified postprocedural states: Secondary | ICD-10-CM

## 2016-10-04 NOTE — Progress Notes (Signed)
   Subjective:    Patient ID: [name], female    DOB: [birth year], 36 y.o.   MRN: [id number]  HPI 36 yo WM P4 here about 6 weeks after an anterior repair, sling, perineal repair, lap bs. She finally is able to pee fine, no more catheter (had it for 2 weeks). She no longer has GUSI ! No problems with BMs, off of narcotics, urispas. She did have a new occasion of perineal pain about 10 days ago. It is slowly improving, only "feels like it's tearing" when she sits down to pee.  She has not had sex since surgery.   Review of Systems She works for [employer], lifting heavy stuff    Objective:   Physical Exam  [id number] Breathing, conversing, and ambulating normally Perineum with a small suture present, I wiped it away Excellent cosmetic result Some sutures still in the anterior vaginal wall Bimanual normal       Assessment & Plan:  Perineal pain- reassurance given Rec no sex until next week RT work in 2 more week RTC 1 year/prn sooner

## 2016-10-11 ENCOUNTER — Telehealth: Payer: Self-pay | Admitting: *Deleted

## 2016-10-11 DIAGNOSIS — O99345 Other mental disorders complicating the puerperium: Principal | ICD-10-CM

## 2016-10-11 DIAGNOSIS — F53 Postpartum depression: Secondary | ICD-10-CM

## 2016-10-11 MED ORDER — SERTRALINE HCL 50 MG PO TABS: 50.0000 mg | ORAL_TABLET | Freq: Every day | ORAL | 2 refills | Status: DC

## 2016-10-11 NOTE — Telephone Encounter (Signed)
Received fax from [facility] requesting a 90 day supply on the Zoloft, rx sent to pharmacy.

## 2016-11-16 DIAGNOSIS — M545 Low back pain: Secondary | ICD-10-CM | POA: Diagnosis not present

## 2016-11-16 DIAGNOSIS — I10 Essential (primary) hypertension: Secondary | ICD-10-CM | POA: Diagnosis not present

## 2016-11-28 ENCOUNTER — Emergency Department
Admission: EM | Admit: 2016-11-28 | Discharge: 2016-11-28 | Disposition: A | Discharge status: Home / Self Care | Payer: 59 | Attending: Emergency Medicine | Admitting: Emergency Medicine

## 2016-11-28 ENCOUNTER — Encounter: Payer: Self-pay | Admitting: Emergency Medicine

## 2016-11-28 ENCOUNTER — Emergency Department: Payer: 59

## 2016-11-28 DIAGNOSIS — Z79899 Other long term (current) drug therapy: Secondary | ICD-10-CM | POA: Diagnosis not present

## 2016-11-28 DIAGNOSIS — F17210 Nicotine dependence, cigarettes, uncomplicated: Secondary | ICD-10-CM | POA: Diagnosis not present

## 2016-11-28 DIAGNOSIS — I10 Essential (primary) hypertension: Secondary | ICD-10-CM | POA: Insufficient documentation

## 2016-11-28 DIAGNOSIS — R1010 Upper abdominal pain, unspecified: Secondary | ICD-10-CM | POA: Diagnosis not present

## 2016-11-28 DIAGNOSIS — R1013 Epigastric pain: Secondary | ICD-10-CM | POA: Insufficient documentation

## 2016-11-28 DIAGNOSIS — R110 Nausea: Secondary | ICD-10-CM | POA: Insufficient documentation

## 2016-11-28 DIAGNOSIS — N200 Calculus of kidney: Secondary | ICD-10-CM | POA: Diagnosis not present

## 2016-11-28 LAB — URINALYSIS, COMPLETE (UACMP) WITH MICROSCOPIC
Bilirubin Urine: NEGATIVE
GLUCOSE, UA: NEGATIVE mg/dL
HGB URINE DIPSTICK: NEGATIVE
Ketones, ur: NEGATIVE mg/dL
NITRITE: NEGATIVE
PH: 7 (ref 5.0–8.0)
PROTEIN: NEGATIVE mg/dL
Specific Gravity, Urine: 1.003 — ABNORMAL LOW (ref 1.005–1.030)

## 2016-11-28 LAB — COMPREHENSIVE METABOLIC PANEL
ALBUMIN: 4.7 g/dL (ref 3.5–5.0)
ALT: 52 U/L (ref 14–54)
ANION GAP: 8 (ref 5–15)
AST: 34 U/L (ref 15–41)
Alkaline Phosphatase: 81 U/L (ref 38–126)
BUN: 13 mg/dL (ref 6–20)
CALCIUM: 9.8 mg/dL (ref 8.9–10.3)
CO2: 27 mmol/L (ref 22–32)
Chloride: 101 mmol/L (ref 101–111)
Creatinine, Ser: 0.73 mg/dL (ref 0.44–1.00)
GFR calc Af Amer: >60 mL/min (ref >60)
GFR calc non Af Amer: >60 mL/min (ref >60)
GLUCOSE: 108 mg/dL — AB (ref 65–99)
POTASSIUM: 3.6 mmol/L (ref 3.5–5.1)
SODIUM: 136 mmol/L (ref 135–145)
Total Bilirubin: 0.7 mg/dL (ref 0.3–1.2)
Total Protein: 8.3 g/dL — ABNORMAL HIGH (ref 6.5–8.1)

## 2016-11-28 LAB — CBC
HCT: 40.1 % (ref 35.0–47.0)
Hemoglobin: 14 g/dL (ref 12.0–16.0)
MCH: 29.9 pg (ref 26.0–34.0)
MCHC: 34.9 g/dL (ref 32.0–36.0)
MCV: 85.7 fL (ref 80.0–100.0)
Platelets: 269 K/uL (ref 150–440)
RBC: 4.67 MIL/uL (ref 3.80–5.20)
RDW: 12.9 % (ref 11.5–14.5)
WBC: 7.1 K/uL (ref 3.6–11.0)

## 2016-11-28 LAB — LIPASE, BLOOD: LIPASE: 45 U/L (ref 11–51)

## 2016-11-28 IMAGING — CR DG ABDOMEN 2V
1 series · 2 of 2 positions shown · non-contrast
Comparison: Ultrasound [DATE] .

CLINICAL DATA: Epigastric pain.

EXAM:
ABDOMEN - 2 VIEW

[Series 1: dg abd 2 views · 0.14mm/px · 2 of 2 slices shown]
[im 1/2]
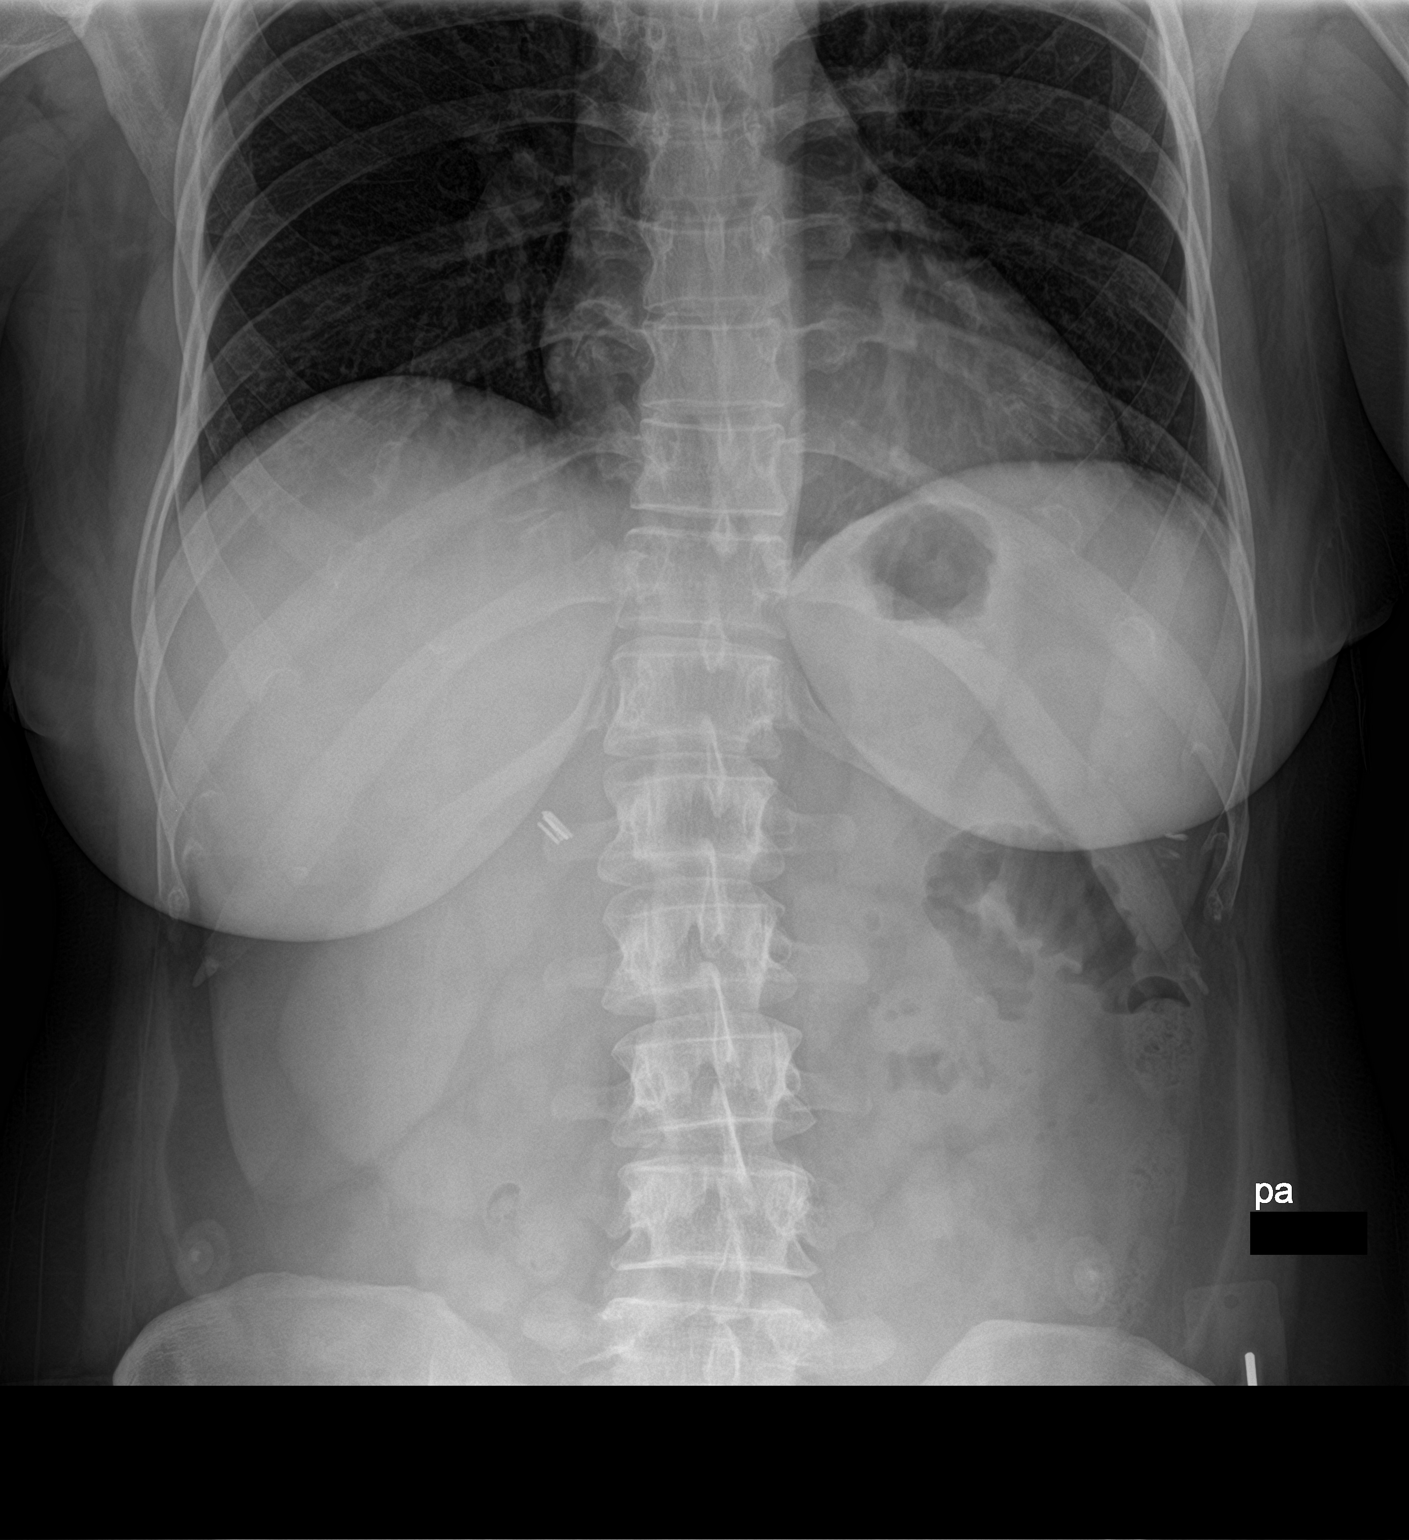
[im 2/2]
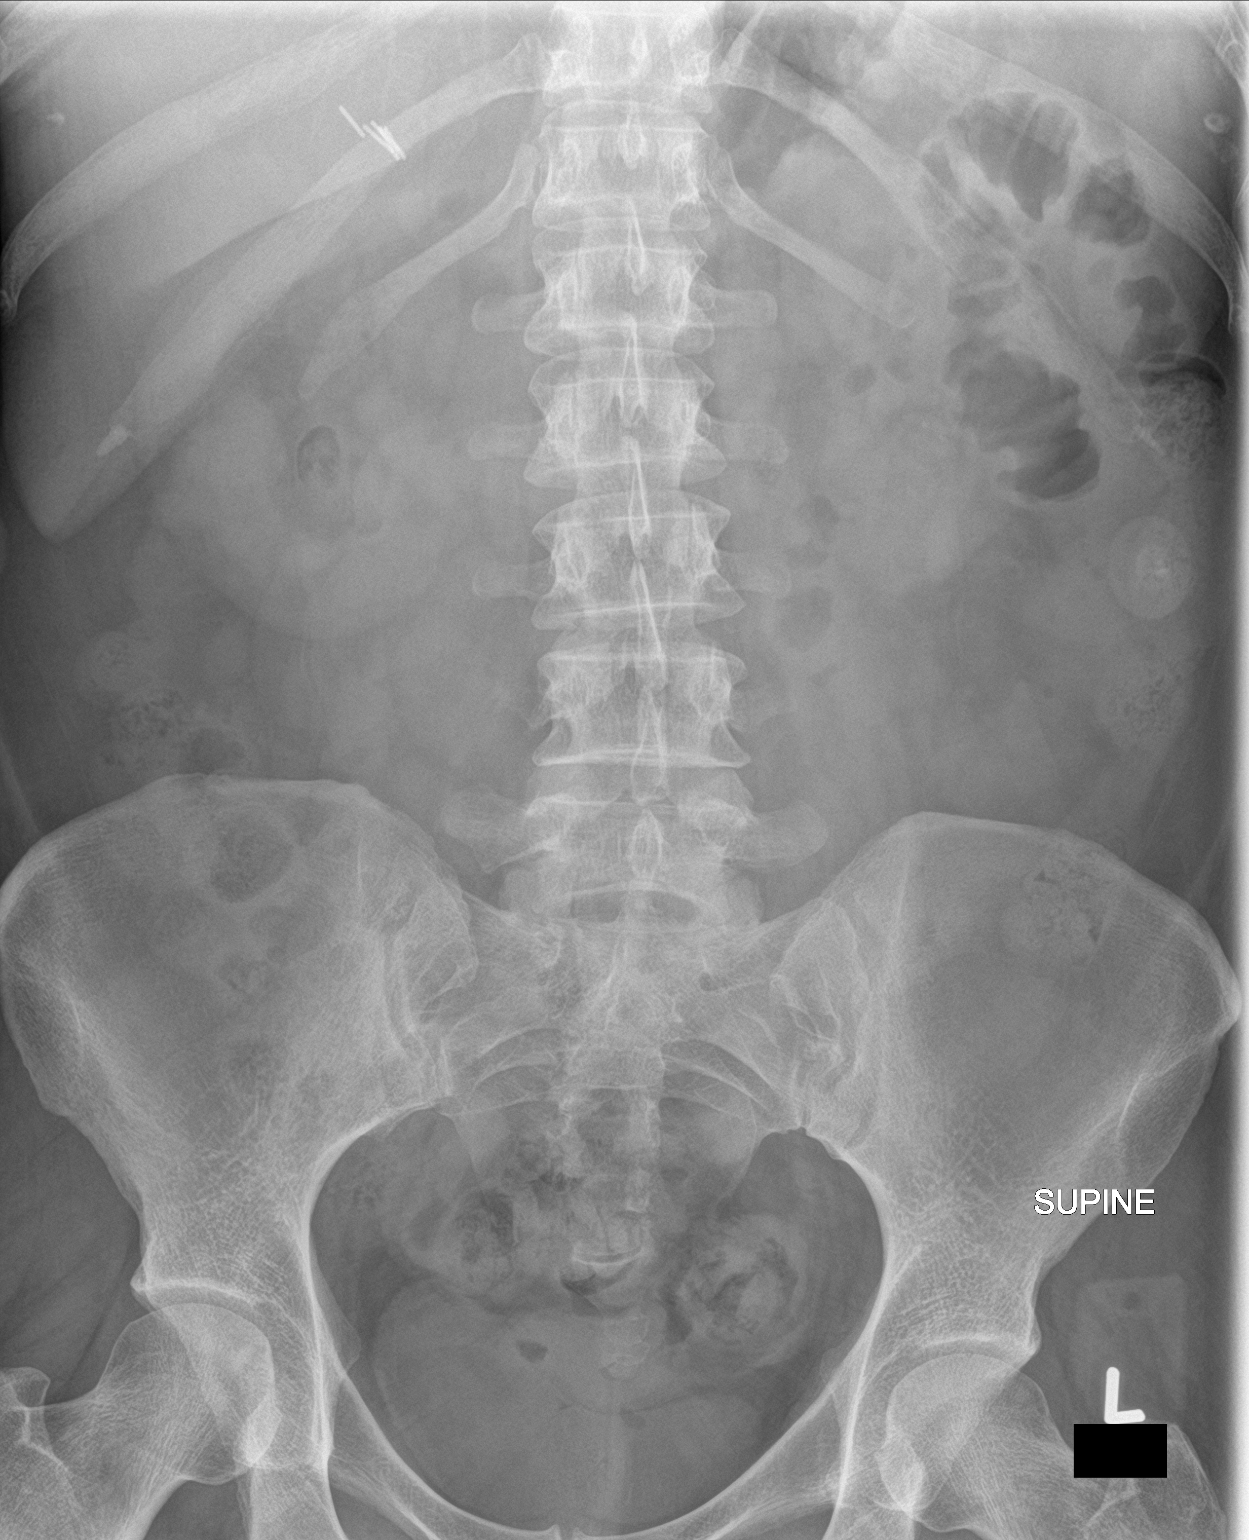

[2 of 2 positions shown; findings below may reference images not displayed]

FINDINGS: Surgical clips right upper quadrant. Stool noted throughout the
colon. No free air. Tiny left renal calyceal stone cannot be
excluded. Degenerative changes lumbar spine and both hips.
IMPRESSION: Tiny left renal calyceal stone cannot be excluded.

## 2016-11-28 MED ORDER — OXYCODONE-ACETAMINOPHEN 5-325 MG PO TABS
2.0000 | ORAL_TABLET | Freq: Once | ORAL | Status: AC
  Administered 2016-11-28: 2 via ORAL
  Filled 2016-11-28: qty 2

## 2016-11-28 MED ORDER — OXYCODONE-ACETAMINOPHEN 5-325 MG PO TABS: 1.0000 | ORAL_TABLET | Freq: Four times a day (QID) | ORAL | 0 refills | Status: DC | PRN

## 2016-11-28 MED ORDER — GI COCKTAIL ~~LOC~~
30.0000 mL | Freq: Once | ORAL | Status: AC
  Administered 2016-11-28: 30 mL via ORAL
  Filled 2016-11-28: qty 30

## 2016-11-28 MED ORDER — FAMOTIDINE IN NACL 20-0.9 MG/50ML-% IV SOLN
20.0000 mg | Freq: Once | INTRAVENOUS | Status: AC
  Administered 2016-11-28: 20 mg via INTRAVENOUS
  Filled 2016-11-28: qty 50

## 2016-11-28 MED ORDER — ONDANSETRON HCL 4 MG/2ML IJ SOLN
4.0000 mg | Freq: Once | INTRAMUSCULAR | Status: AC
  Administered 2016-11-28: 4 mg via INTRAVENOUS
  Filled 2016-11-28: qty 2

## 2016-11-28 MED ORDER — SUCRALFATE 1 G PO TABS: 1.0000 g | ORAL_TABLET | Freq: Four times a day (QID) | ORAL | 1 refills | Status: DC

## 2016-11-28 MED ORDER — FAMOTIDINE 20 MG PO TABS: 20.0000 mg | ORAL_TABLET | Freq: Two times a day (BID) | ORAL | 1 refills | Status: DC

## 2016-11-28 MED ORDER — MORPHINE SULFATE (PF) 4 MG/ML IV SOLN
4.0000 mg | Freq: Once | INTRAVENOUS | Status: AC
Start: 2016-11-28 — End: 2016-11-28
  Administered 2016-11-28: 4 mg via INTRAVENOUS
  Filled 2016-11-28: qty 1

## 2016-11-28 NOTE — ED Triage Notes (Signed)
C/O epigastric pain that radiates around torso to back. Describes pain and intermittent spasms that is accompanied by nausea.  Symptoms onset 1 day.

## 2016-11-28 NOTE — ED Provider Notes (Signed)
[facility] Emergency Department Provider Note       Time seen: ----------------------------------------- 1:20 PM on 11/28/2016 -----------------------------------------     I have reviewed the triage vital signs and the nursing notes.   HISTORY   Chief Complaint Abdominal Pain; Back Pain; and Nausea    HPI [name] is a 36 y.o. female who presents to the ED for epigastric pain that radiates around the torso to the back. She describes pain as intermittent and has a spasm. This is been coming by nausea, symptom onset was 24 hours. She has never had this before, nothing makes it better or worse.   Past Medical History:  Diagnosis Date  . Anemia    with pregnancy  . Depression    PPD  . Gestational diabetes    with 1st pregnancy  . Glomerulonephritis   . Hypertension   . Kidney infection   . Migraine   . PONV (postoperative nausea and vomiting)    crying  . Urinary tract infection     Patient Active Problem List   Diagnosis Date Noted  . Carpal tunnel syndrome during pregnancy 02/08/2016  . Desires sterilization 11/24/2015  . Supervision of high risk pregnancy, antepartum 09/29/2015  . Hypertriglyceridemia 06/17/2014  . HTN (hypertension), benign 02/13/2011    Past Surgical History:  Procedure Laterality Date  . CHOLECYSTECTOMY N/A 11/2013  . CYSTOCELE REPAIR N/A 08/28/2016   Procedure: ANTERIOR REPAIR (CYSTOCELE);  Surgeon: Emily Filbert, MD;  Location: [facility];  Service: Gynecology;  Laterality: N/A;  . DILATION AND EVACUATION  03/09/2011   Procedure: DILATATION AND EVACUATION (D&E);  Surgeon: Donnamae Jude, MD;  Location: [facility];  Service: Gynecology;  Laterality: N/A;  . LAPAROSCOPIC BILATERAL SALPINGECTOMY Bilateral 08/28/2016   Procedure: LAPAROSCOPIC BILATERAL SALPINGECTOMY;  Surgeon: Emily Filbert, MD;  Location: [facility];  Service: Gynecology;  Laterality: Bilateral;  . PERINEOPLASTY N/A 08/28/2016   Procedure: PERINEOPLASTY;   Surgeon: Emily Filbert, MD;  Location: [facility];  Service: Gynecology;  Laterality: N/A;  . TONSILLECTOMY  1988  . Tubes in ears  Baby    Allergies Patient has no known allergies.  Social History Social History  Substance Use Topics  . Smoking status: Current Every Day Smoker    Packs/day: 0.50    Years: 16.00    Types: Cigarettes  . Smokeless tobacco: Never Used  . Alcohol use No     Comment: prior to pregnancy    Review of Systems Constitutional: Negative for fever. Eyes: Negative for vision changes ENT:  Negative for congestion, sore throat Cardiovascular: Negative for chest pain. Respiratory: Negative for shortness of breath. Gastrointestinal: Positive for abdominal pain, nausea Genitourinary: Negative for dysuria. Musculoskeletal: Negative for back pain. Skin: Negative for rash. Neurological: Negative for headaches, focal weakness or numbness.  All systems negative/normal/unremarkable except as stated in the HPI  ____________________________________________   PHYSICAL EXAM:  VITAL SIGNS: ED Triage Vitals  Enc Vitals Group     BP 11/28/16 1200 129/77     Pulse Rate 11/28/16 1200 64     Resp 11/28/16 1200 16     Temp 11/28/16 1200 98.3 F (36.8 C)     Temp Source 11/28/16 1200 Oral     SpO2 11/28/16 1200 100 %     Weight 11/28/16 1158 180 lb (81.6 kg)     Height 11/28/16 1158 5\' 4"  (1.626 m)     Head Circumference --      Peak Flow --  Pain Score 11/28/16 1158 6     Pain Loc --      Pain Edu? --      Excl. in [place]? --     Constitutional: Alert and oriented. Well appearing and in no distress. Eyes: Conjunctivae are normal. Normal extraocular movements. ENT   Head: Normocephalic and atraumatic.   Nose: No congestion/rhinnorhea.   Mouth/Throat: Mucous membranes are moist.   Neck: No stridor. Cardiovascular: Normal rate, regular rhythm. No murmurs, rubs, or gallops. Respiratory: Normal respiratory effort without tachypnea nor retractions.  Breath sounds are clear and equal bilaterally. No wheezes/rales/rhonchi. Gastrointestinal: Epigastric tenderness, no rebound or guarding. Normal bowel sounds. Musculoskeletal: Nontender with normal range of motion in extremities. No lower extremity tenderness nor edema. Neurologic:  Normal speech and language. No gross focal neurologic deficits are appreciated.  Skin:  Skin is warm, dry and intact. No rash noted. Psychiatric: Mood and affect are normal. Speech and behavior are normal.  ____________________________________________  EKG: Interpreted by me. Sinus bradycardia with rate of 52 bpm, prolonged PR interval, normal QRS, normal QT.  ____________________________________________  ED COURSE:  Pertinent labs & imaging results that were available during my care of the patient were reviewed by me and considered in my medical decision making (see chart for details). Patient presents for abdominal pain, we will assess with labs and imaging as indicated.   Procedures ____________________________________________   LABS (pertinent positives/negatives)  Labs Reviewed  COMPREHENSIVE METABOLIC PANEL - Abnormal; Notable for the following:       Result Value   Glucose, Bld 108 (*)    Total Protein 8.3 (*)    All other components within normal limits  URINALYSIS, COMPLETE (UACMP) WITH MICROSCOPIC - Abnormal; Notable for the following:    Color, Urine COLORLESS (*)    APPearance HAZY (*)    Specific Gravity, Urine 1.003 (*)    Leukocytes, UA TRACE (*)    Bacteria, UA RARE (*)    Squamous Epithelial / LPF 0-5 (*)    All other components within normal limits  LIPASE, BLOOD  CBC    RADIOLOGY Images were viewed by me  Abdomen 2 view IMPRESSION: Tiny left renal calyceal stone cannot be excluded. ____________________________________________  FINAL ASSESSMENT AND PLAN  Abdominal pain  Plan: Patient's labs and imaging were dictated above. Patient had presented for Upper abdominal  pain which is likely GERD or peptic ulcer related. She'll be given proper medication for same. She is stable for outpatient follow-up.   Earleen Newport, MD   Note: This note was generated in part or whole with voice recognition software. Voice recognition is usually quite accurate but there are transcription errors that can and very often do occur. I apologize for any typographical errors that were not detected and corrected.     Earleen Newport, MD 11/28/16 [phone]

## 2016-12-04 DIAGNOSIS — R1084 Generalized abdominal pain: Secondary | ICD-10-CM | POA: Diagnosis not present

## 2016-12-15 ENCOUNTER — Encounter: Payer: Self-pay | Admitting: Emergency Medicine

## 2016-12-15 ENCOUNTER — Emergency Department: Payer: 59

## 2016-12-15 ENCOUNTER — Emergency Department
Admission: EM | Admit: 2016-12-15 | Discharge: 2016-12-15 | Disposition: A | Discharge status: Home / Self Care | Payer: 59 | Attending: Emergency Medicine | Admitting: Emergency Medicine

## 2016-12-15 DIAGNOSIS — R0602 Shortness of breath: Secondary | ICD-10-CM | POA: Insufficient documentation

## 2016-12-15 DIAGNOSIS — R0789 Other chest pain: Secondary | ICD-10-CM | POA: Insufficient documentation

## 2016-12-15 DIAGNOSIS — Z5321 Procedure and treatment not carried out due to patient leaving prior to being seen by health care provider: Secondary | ICD-10-CM | POA: Diagnosis not present

## 2016-12-15 NOTE — ED Notes (Signed)
Pt called 4 times, no answer, pt left after triage

## 2016-12-15 NOTE — ED Triage Notes (Signed)
Pt reports chest tightness for the past 2 days some shortness of breath, pain to her shoulder blades. Pt denies any other symptom pt talks in complete sentences no distress noted

## 2017-01-18 DIAGNOSIS — Z0000 Encounter for general adult medical examination without abnormal findings: Secondary | ICD-10-CM | POA: Diagnosis not present

## 2017-01-18 DIAGNOSIS — I10 Essential (primary) hypertension: Secondary | ICD-10-CM | POA: Diagnosis not present

## 2017-01-18 DIAGNOSIS — E781 Pure hyperglyceridemia: Secondary | ICD-10-CM | POA: Diagnosis not present

## 2017-01-25 DIAGNOSIS — E781 Pure hyperglyceridemia: Secondary | ICD-10-CM | POA: Diagnosis not present

## 2017-01-25 DIAGNOSIS — M542 Cervicalgia: Secondary | ICD-10-CM | POA: Diagnosis not present

## 2017-01-25 DIAGNOSIS — M791 Myalgia: Secondary | ICD-10-CM | POA: Diagnosis not present

## 2017-01-25 DIAGNOSIS — I10 Essential (primary) hypertension: Secondary | ICD-10-CM | POA: Diagnosis not present

## 2017-01-30 DIAGNOSIS — M545 Low back pain: Secondary | ICD-10-CM | POA: Diagnosis not present

## 2017-02-09 DIAGNOSIS — M79672 Pain in left foot: Secondary | ICD-10-CM | POA: Diagnosis not present

## 2017-02-09 DIAGNOSIS — S9032XA Contusion of left foot, initial encounter: Secondary | ICD-10-CM | POA: Diagnosis not present

## 2017-02-09 DIAGNOSIS — S99922A Unspecified injury of left foot, initial encounter: Secondary | ICD-10-CM | POA: Diagnosis not present

## 2017-02-13 DIAGNOSIS — M12841 Other specific arthropathies, not elsewhere classified, right hand: Secondary | ICD-10-CM | POA: Diagnosis not present

## 2017-02-13 DIAGNOSIS — R5382 Chronic fatigue, unspecified: Secondary | ICD-10-CM

## 2017-02-13 DIAGNOSIS — R768 Other specified abnormal immunological findings in serum: Secondary | ICD-10-CM | POA: Diagnosis not present

## 2017-02-13 DIAGNOSIS — R5381 Other malaise: Secondary | ICD-10-CM | POA: Insufficient documentation

## 2017-02-13 DIAGNOSIS — M2550 Pain in unspecified joint: Secondary | ICD-10-CM | POA: Diagnosis not present

## 2017-02-26 DIAGNOSIS — I10 Essential (primary) hypertension: Secondary | ICD-10-CM | POA: Diagnosis not present

## 2017-02-26 DIAGNOSIS — R768 Other specified abnormal immunological findings in serum: Secondary | ICD-10-CM | POA: Diagnosis not present

## 2017-02-26 DIAGNOSIS — Z79899 Other long term (current) drug therapy: Secondary | ICD-10-CM | POA: Insufficient documentation

## 2017-02-26 DIAGNOSIS — R5382 Chronic fatigue, unspecified: Secondary | ICD-10-CM | POA: Diagnosis not present

## 2017-02-26 DIAGNOSIS — M059 Rheumatoid arthritis with rheumatoid factor, unspecified: Secondary | ICD-10-CM | POA: Insufficient documentation

## 2017-02-26 DIAGNOSIS — E781 Pure hyperglyceridemia: Secondary | ICD-10-CM | POA: Diagnosis not present

## 2017-02-26 DIAGNOSIS — E559 Vitamin D deficiency, unspecified: Secondary | ICD-10-CM | POA: Insufficient documentation

## 2017-03-07 DIAGNOSIS — E781 Pure hyperglyceridemia: Secondary | ICD-10-CM | POA: Diagnosis not present

## 2017-03-07 DIAGNOSIS — I10 Essential (primary) hypertension: Secondary | ICD-10-CM | POA: Diagnosis not present

## 2017-03-07 DIAGNOSIS — M059 Rheumatoid arthritis with rheumatoid factor, unspecified: Secondary | ICD-10-CM | POA: Diagnosis not present

## 2017-03-26 DIAGNOSIS — Z79899 Other long term (current) drug therapy: Secondary | ICD-10-CM | POA: Diagnosis not present

## 2017-03-26 DIAGNOSIS — M059 Rheumatoid arthritis with rheumatoid factor, unspecified: Secondary | ICD-10-CM | POA: Diagnosis not present

## 2017-03-28 DIAGNOSIS — M546 Pain in thoracic spine: Secondary | ICD-10-CM | POA: Diagnosis not present

## 2017-04-02 DIAGNOSIS — R748 Abnormal levels of other serum enzymes: Secondary | ICD-10-CM | POA: Insufficient documentation

## 2017-04-02 DIAGNOSIS — Z23 Encounter for immunization: Secondary | ICD-10-CM | POA: Diagnosis not present

## 2017-04-02 DIAGNOSIS — I10 Essential (primary) hypertension: Secondary | ICD-10-CM | POA: Diagnosis not present

## 2017-04-02 DIAGNOSIS — Z79899 Other long term (current) drug therapy: Secondary | ICD-10-CM | POA: Diagnosis not present

## 2017-04-02 DIAGNOSIS — E781 Pure hyperglyceridemia: Secondary | ICD-10-CM | POA: Diagnosis not present

## 2017-04-02 DIAGNOSIS — M059 Rheumatoid arthritis with rheumatoid factor, unspecified: Secondary | ICD-10-CM | POA: Diagnosis not present

## 2017-04-02 DIAGNOSIS — R300 Dysuria: Secondary | ICD-10-CM | POA: Diagnosis not present

## 2017-05-06 DIAGNOSIS — I10 Essential (primary) hypertension: Secondary | ICD-10-CM | POA: Diagnosis not present

## 2017-05-06 DIAGNOSIS — M059 Rheumatoid arthritis with rheumatoid factor, unspecified: Secondary | ICD-10-CM | POA: Diagnosis not present

## 2017-05-06 DIAGNOSIS — R300 Dysuria: Secondary | ICD-10-CM | POA: Diagnosis not present

## 2017-05-06 DIAGNOSIS — E781 Pure hyperglyceridemia: Secondary | ICD-10-CM | POA: Diagnosis not present

## 2017-05-10 DIAGNOSIS — Z79899 Other long term (current) drug therapy: Secondary | ICD-10-CM | POA: Diagnosis not present

## 2017-05-10 DIAGNOSIS — M059 Rheumatoid arthritis with rheumatoid factor, unspecified: Secondary | ICD-10-CM | POA: Diagnosis not present

## 2017-05-10 DIAGNOSIS — I10 Essential (primary) hypertension: Secondary | ICD-10-CM | POA: Diagnosis not present

## 2017-05-12 DIAGNOSIS — J019 Acute sinusitis, unspecified: Secondary | ICD-10-CM | POA: Diagnosis not present

## 2017-05-12 DIAGNOSIS — J209 Acute bronchitis, unspecified: Secondary | ICD-10-CM | POA: Diagnosis not present

## 2017-05-12 DIAGNOSIS — B9689 Other specified bacterial agents as the cause of diseases classified elsewhere: Secondary | ICD-10-CM | POA: Diagnosis not present

## 2017-05-26 ENCOUNTER — Other Ambulatory Visit: Payer: Self-pay

## 2017-05-26 ENCOUNTER — Emergency Department: Payer: 59

## 2017-05-26 ENCOUNTER — Encounter: Payer: Self-pay | Admitting: Emergency Medicine

## 2017-05-26 ENCOUNTER — Emergency Department
Admission: EM | Admit: 2017-05-26 | Discharge: 2017-05-26 | Disposition: A | Discharge status: Home / Self Care | Payer: 59 | Attending: Student in an Organized Health Care Education/Training Program | Admitting: Student in an Organized Health Care Education/Training Program

## 2017-05-26 DIAGNOSIS — Z79899 Other long term (current) drug therapy: Secondary | ICD-10-CM | POA: Diagnosis not present

## 2017-05-26 DIAGNOSIS — R059 Cough, unspecified: Secondary | ICD-10-CM

## 2017-05-26 DIAGNOSIS — F17210 Nicotine dependence, cigarettes, uncomplicated: Secondary | ICD-10-CM | POA: Diagnosis not present

## 2017-05-26 DIAGNOSIS — Z9049 Acquired absence of other specified parts of digestive tract: Secondary | ICD-10-CM | POA: Diagnosis not present

## 2017-05-26 DIAGNOSIS — I10 Essential (primary) hypertension: Secondary | ICD-10-CM | POA: Insufficient documentation

## 2017-05-26 DIAGNOSIS — R05 Cough: Secondary | ICD-10-CM

## 2017-05-26 DIAGNOSIS — F329 Major depressive disorder, single episode, unspecified: Secondary | ICD-10-CM | POA: Diagnosis not present

## 2017-05-26 DIAGNOSIS — R079 Chest pain, unspecified: Secondary | ICD-10-CM | POA: Insufficient documentation

## 2017-05-26 DIAGNOSIS — R0789 Other chest pain: Secondary | ICD-10-CM | POA: Diagnosis not present

## 2017-05-26 LAB — BASIC METABOLIC PANEL
ANION GAP: 10 (ref 5–15)
BUN: 17 mg/dL (ref 6–20)
CO2: 23 mmol/L (ref 22–32)
Calcium: 9.3 mg/dL (ref 8.9–10.3)
Chloride: 102 mmol/L (ref 101–111)
Creatinine, Ser: 0.66 mg/dL (ref 0.44–1.00)
GLUCOSE: 102 mg/dL — AB (ref 65–99)
POTASSIUM: 3.7 mmol/L (ref 3.5–5.1)
Sodium: 135 mmol/L (ref 135–145)

## 2017-05-26 LAB — CBC
HEMATOCRIT: 36 % (ref 35.0–47.0)
HEMOGLOBIN: 12.9 g/dL (ref 12.0–16.0)
MCH: 33.4 pg (ref 26.0–34.0)
MCHC: 35.8 g/dL (ref 32.0–36.0)
MCV: 93.4 fL (ref 80.0–100.0)
Platelets: 322 10*3/uL (ref 150–440)
RBC: 3.85 MIL/uL (ref 3.80–5.20)
RDW: 14.7 % — ABNORMAL HIGH (ref 11.5–14.5)
WBC: 8.1 10*3/uL (ref 3.6–11.0)

## 2017-05-26 LAB — TROPONIN I: Troponin I: <0.03 ng/mL (ref <0.03)

## 2017-05-26 IMAGING — CR DG CHEST 2V
1 series · 2 of 2 positions shown · non-contrast
Comparison: Radiographs [DATE].

CLINICAL DATA: Chest pain.

EXAM:
CHEST  2 VIEW

[Series 1: dg chest 2 view · 0.14mm/px · 2 of 2 slices shown]
[im 1/2]
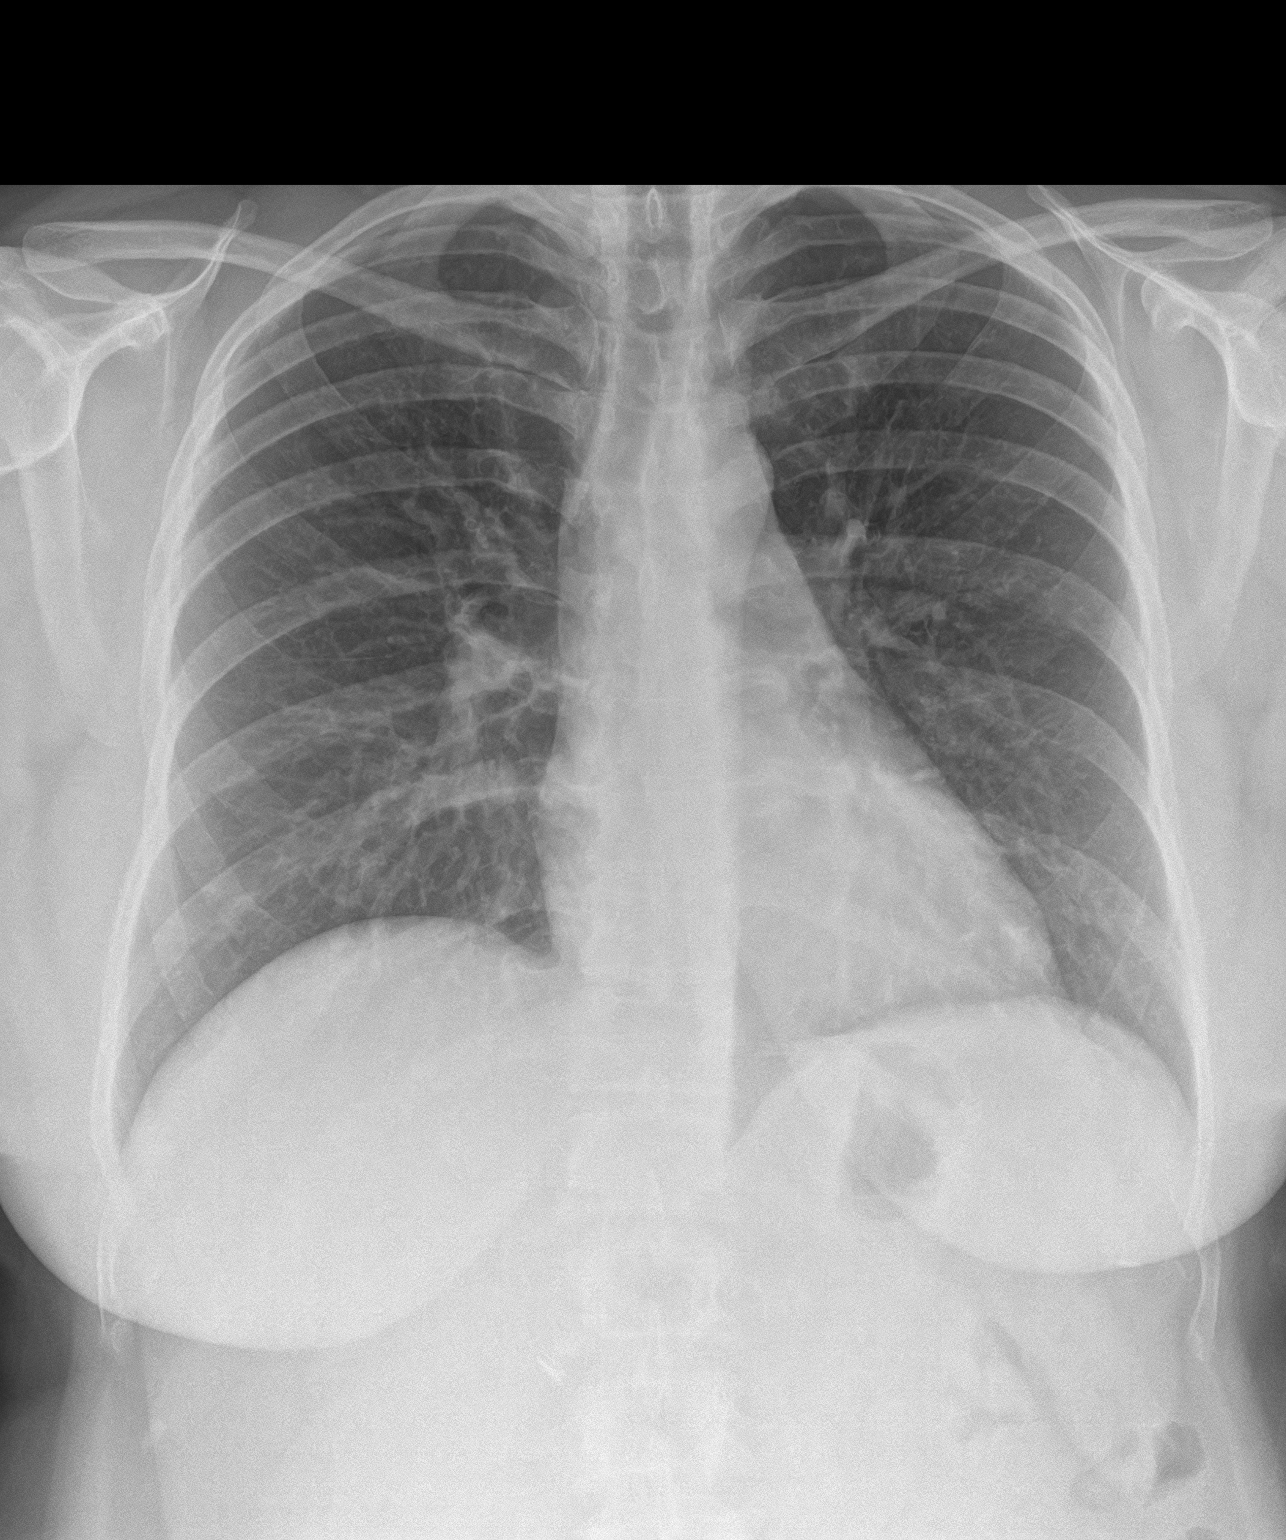
[im 2/2]
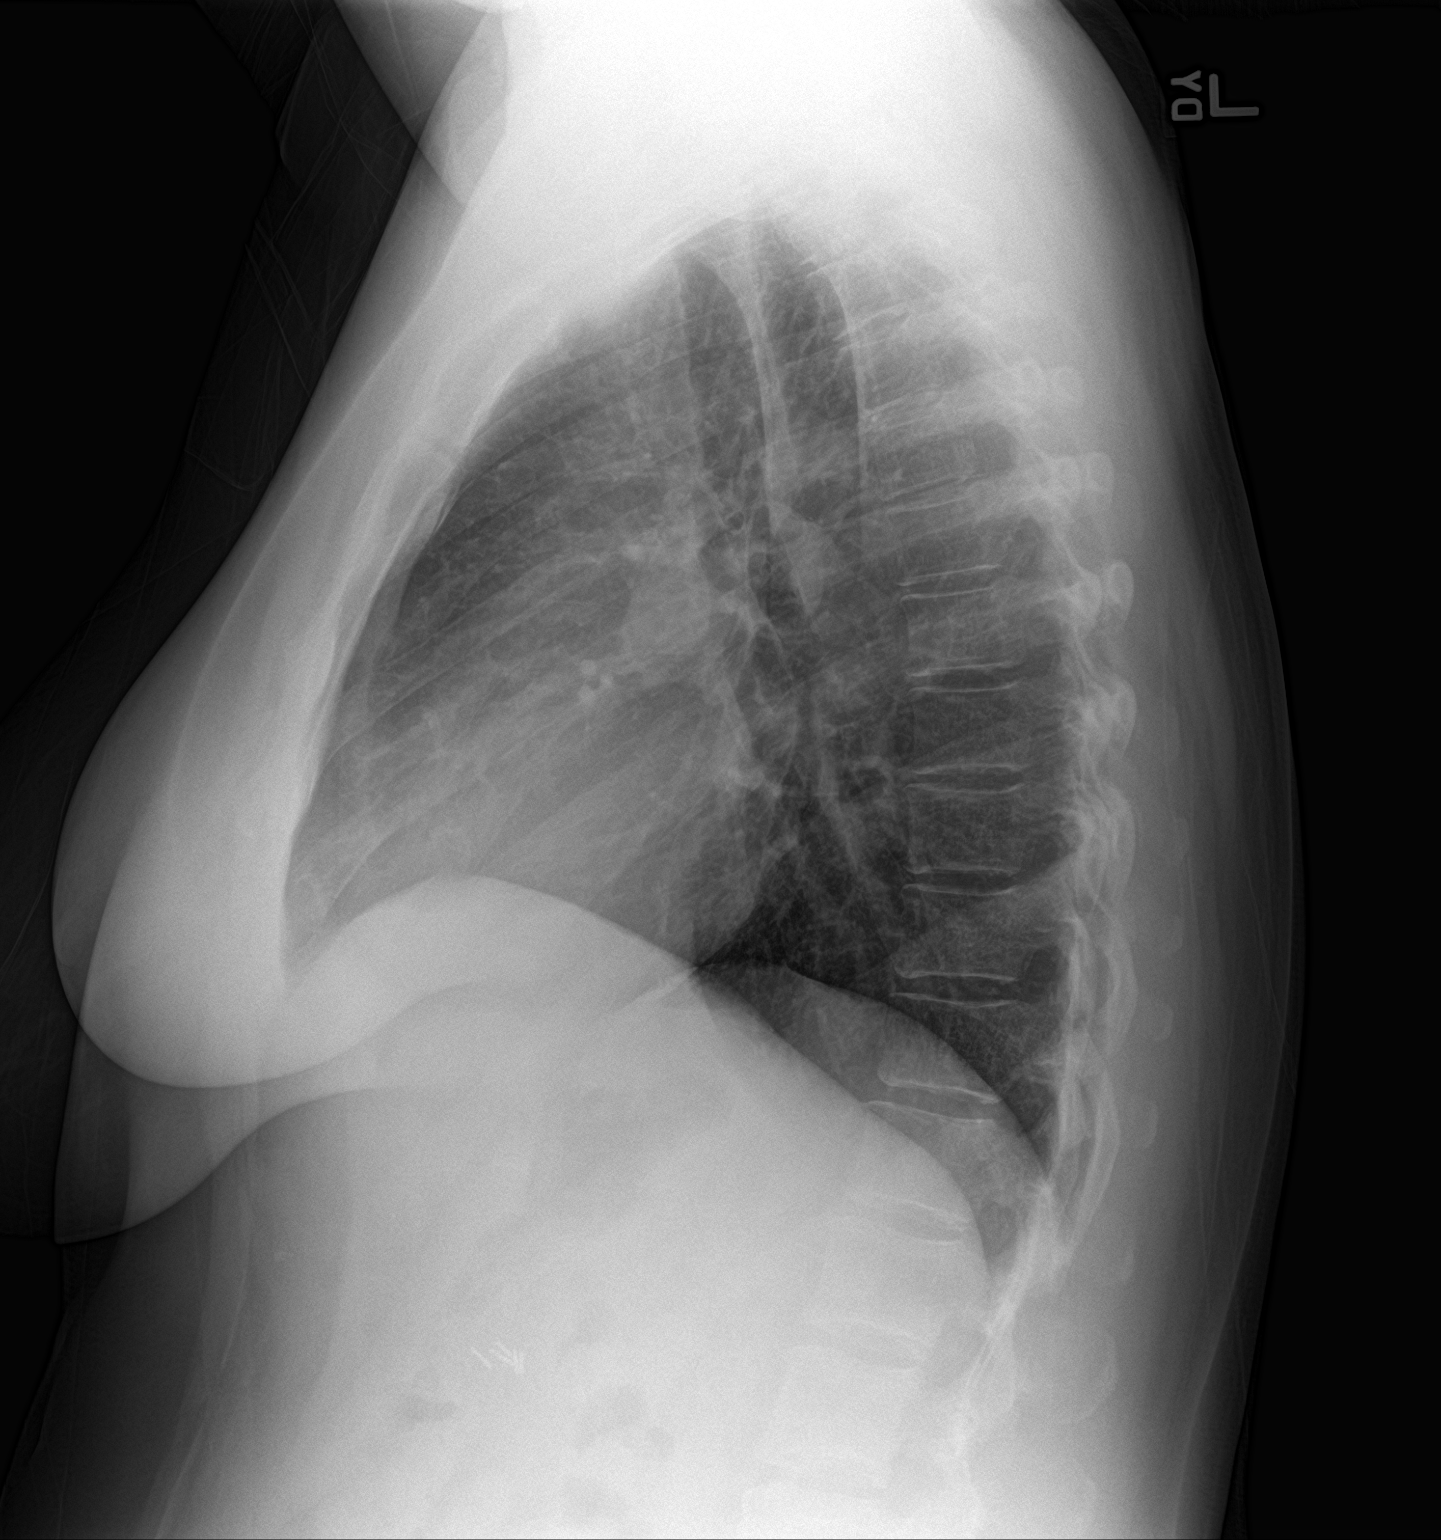

[2 of 2 positions shown; findings below may reference images not displayed]

FINDINGS: The heart size and mediastinal contours are within normal limits.
Both lungs are clear. No pneumothorax or pleural effusion is noted.
The visualized skeletal structures are unremarkable.
IMPRESSION: No active cardiopulmonary disease.

## 2017-05-26 MED ORDER — OPTICHAMBER DIAMOND MISC
1.0000 | Freq: Once | Status: AC
  Administered 2017-05-26: 1

## 2017-05-26 MED ORDER — AEROCHAMBER PLUS FLO-VU MEDIUM MISC
1.0000 | Freq: Once | Status: DC
  Filled 2017-05-26: qty 1

## 2017-05-26 MED ORDER — OPTICHAMBER DIAMOND MISC
Status: AC
  Filled 2017-05-26: qty 1

## 2017-05-26 MED ORDER — PREDNISONE 10 MG PO TABS: 10.0000 mg | ORAL_TABLET | Freq: Every day | ORAL | 0 refills | Status: DC

## 2017-05-26 MED ORDER — BENZONATATE 100 MG PO CAPS: 100.0000 mg | ORAL_CAPSULE | Freq: Four times a day (QID) | ORAL | 0 refills | Status: DC | PRN

## 2017-05-26 NOTE — ED Triage Notes (Signed)
Patient c/o central chest pain with radiation through to the back. Patient states + shob

## 2017-05-26 NOTE — ED Provider Notes (Signed)
[facility] Emergency Department Provider Note    First MD Initiated Contact with Patient 05/26/17 1101     (approximate)  I have reviewed the triage vital signs and the nursing notes.   HISTORY  Chief Complaint Chest Pain    HPI [name] is a 36 y.o. female with a history of rheumatoid arthritis on chronic prednisone presents with 1 month of cough and 3 days of midsternal chest pain and pressure radiating through to her back.  No fevers.  Is having a nonproductive cough.  Did smoke 6 months ago.  No history of DVTs.  Not on any oral contraceptive pills.  Did have fever started earlier earlier this month when she developed this cough and was given a Z-Pak with some improvement in symptoms.  No recent fevers.  No change in pain with exertion.  No diaphoresis or nausea.  No personal history of heart attack or heart disease.   Past Medical History:  Diagnosis Date  . Anemia    with pregnancy  . Depression    PPD  . Gestational diabetes    with 1st pregnancy  . Glomerulonephritis   . Hypertension   . Kidney infection   . Migraine   . PONV (postoperative nausea and vomiting)    crying  . Urinary tract infection    Family History  Problem Relation Age of Onset  . Hypertension Father   . Heart failure Father   . Hepatitis Father   . Cirrhosis Father   . Hyperlipidemia Father   . Migraines Father   . Mental illness Father        Depression  . Diabetes Mother   . Osteoarthritis Mother   . Hypertension Mother   . Hypothyroidism Mother   . Heart disease Mother   . Hyperlipidemia Mother   . Migraines Mother   . Mental illness Mother        Depression  . Breast cancer Maternal Aunt 70  . Cancer Sister 94       Cervical  . Heart disease Sister        Heart Stops  . Migraines Sister   . Seizures Sister 11       unsure if this or heart problem  . Mental illness Sister        depression  . Heart disease Maternal Grandmother    Past  Surgical History:  Procedure Laterality Date  . CHOLECYSTECTOMY N/A 11/2013  . CYSTOCELE REPAIR N/A 08/28/2016   Procedure: ANTERIOR REPAIR (CYSTOCELE);  Surgeon: Emily Filbert, MD;  Location: [facility];  Service: Gynecology;  Laterality: N/A;  . DILATION AND EVACUATION  03/09/2011   Procedure: DILATATION AND EVACUATION (D&E);  Surgeon: Donnamae Jude, MD;  Location: [facility];  Service: Gynecology;  Laterality: N/A;  . LAPAROSCOPIC BILATERAL SALPINGECTOMY Bilateral 08/28/2016   Procedure: LAPAROSCOPIC BILATERAL SALPINGECTOMY;  Surgeon: Emily Filbert, MD;  Location: [facility];  Service: Gynecology;  Laterality: Bilateral;  . PERINEOPLASTY N/A 08/28/2016   Procedure: PERINEOPLASTY;  Surgeon: Emily Filbert, MD;  Location: [facility];  Service: Gynecology;  Laterality: N/A;  . TONSILLECTOMY  1988  . Tubes in ears  Baby   Patient Active Problem List   Diagnosis Date Noted  . Carpal tunnel syndrome during pregnancy 02/08/2016  . Desires sterilization 11/24/2015  . Supervision of high risk pregnancy, antepartum 09/29/2015  . Hypertriglyceridemia 06/17/2014  . HTN (hypertension), benign 02/13/2011      Prior to Admission medications  Medication Sig Start Date End Date Taking? Authorizing Provider  atenolol (TENORMIN) 25 MG tablet Take 25 mg by mouth daily.    [provider]  famotidine (PEPCID) 20 MG tablet Take 1 tablet (20 mg total) by mouth 2 (two) times daily. 11/28/16   Earleen Newport, MD  hydrochlorothiazide (HYDRODIURIL) 25 MG tablet Take 25 mg by mouth daily.    [provider]  ibuprofen (ADVIL,MOTRIN) 600 MG tablet Take 1 tablet (600 mg total) by mouth every 6 (six) hours as needed. 08/28/16   Emily Filbert, MD  oxyCODONE-acetaminophen (PERCOCET) 5-325 MG tablet Take 1-2 tablets by mouth every 6 (six) hours as needed. 11/28/16   Earleen Newport, MD  sertraline (ZOLOFT) 50 MG tablet Take 1 tablet (50 mg total) by mouth daily. 10/11/16   Donnamae Jude, MD  sucralfate (CARAFATE) 1  g tablet Take 1 tablet (1 g total) by mouth 4 (four) times daily. 11/28/16 11/28/17  Earleen Newport, MD  triamterene-hydrochlorothiazide (DYAZIDE) 37.5-25 MG capsule Take 1 capsule by mouth daily.    [provider]    Allergies Patient has no known allergies.    Social History Social History   Tobacco Use  . Smoking status: Current Every Day Smoker    Packs/day: 0.50    Years: 16.00    Pack years: 8.00    Types: Cigarettes  . Smokeless tobacco: Never Used  Substance Use Topics  . Alcohol use: No    Alcohol/week: 0.5 oz    Types: 1 Standard drinks or equivalent per week    Comment: prior to pregnancy  . Drug use: No    Review of Systems Patient denies headaches, rhinorrhea, blurry vision, numbness, shortness of breath, chest pain, edema, cough, abdominal pain, nausea, vomiting, diarrhea, dysuria, fevers, rashes or hallucinations unless otherwise stated above in HPI. ____________________________________________   PHYSICAL EXAM:  VITAL SIGNS: Vitals:   05/26/17 0940  BP: (!) 141/91  Pulse: 77  Resp: 18  Temp: 98 F (36.7 C)  SpO2: 100%    Constitutional: Alert and oriented. Well appearing and in no acute distress. Eyes: Conjunctivae are normal.  Head: Atraumatic. Nose: No congestion/rhinnorhea. Mouth/Throat: Mucous membranes are moist.   Neck: No stridor. Painless ROM.  Cardiovascular: Normal rate, regular rhythm. Grossly normal heart sounds.  Good peripheral circulation. Respiratory: Normal respiratory effort.  No retractions. Lungs with coarse posterior breath sounds and occasional wheeze in left posterior base. Gastrointestinal: Soft and nontender. No distention. No abdominal bruits. No CVA tenderness. Genitourinary:  Musculoskeletal: No lower extremity tenderness nor edema.  No joint effusions. Neurologic:  Normal speech and language. No gross focal neurologic deficits are appreciated. No facial droop Skin:  Skin is warm, dry and intact. No rash  noted. Psychiatric: Mood and affect are normal. Speech and behavior are normal.  ____________________________________________   LABS (all labs ordered are listed, but only abnormal results are displayed)  Results for orders placed or performed during the hospital encounter of 05/26/17 (from the past 24 hour(s))  Basic metabolic panel     Status: Abnormal   Collection Time: 05/26/17  9:43 AM  Result Value Ref Range   Sodium 135 135 - 145 mmol/L   Potassium 3.7 3.5 - 5.1 mmol/L   Chloride 102 101 - 111 mmol/L   CO2 23 22 - 32 mmol/L   Glucose, Bld 102 (H) 65 - 99 mg/dL   BUN 17 6 - 20 mg/dL   Creatinine, Ser 0.66 0.44 - 1.00 mg/dL  Calcium 9.3 8.9 - 10.3 mg/dL   GFR calc non Af Amer >60 >60 mL/min   GFR calc Af Amer >60 >60 mL/min   Anion gap 10 5 - 15  CBC     Status: Abnormal   Collection Time: 05/26/17  9:43 AM  Result Value Ref Range   WBC 8.1 3.6 - 11.0 K/uL   RBC 3.85 3.80 - 5.20 MIL/uL   Hemoglobin 12.9 12.0 - 16.0 g/dL   HCT 36.0 35.0 - 47.0 %   MCV 93.4 80.0 - 100.0 fL   MCH 33.4 26.0 - 34.0 pg   MCHC 35.8 32.0 - 36.0 g/dL   RDW 14.7 (H) 11.5 - 14.5 %   Platelets 322 150 - 440 K/uL  Troponin I     Status: None   Collection Time: 05/26/17  9:43 AM  Result Value Ref Range   Troponin I <0.03 <0.03 ng/mL   ____________________________________________  EKG My review and personal interpretation at Time: 9:44   Indication: chest pain  Rate: 70  Rhythm: sinus Axis: normal Other: no stemi, nonspecific st and t wave changes that are consistent with previous EKG 12/16/16 ____________________________________________  RADIOLOGY  I personally reviewed all radiographic images ordered to evaluate for the above acute complaints and reviewed radiology reports and findings.  These findings were personally discussed with the patient.  Please see medical record for radiology report.  ____________________________________________   PROCEDURES  Procedure(s) performed:    Procedures    Critical Care performed: no ____________________________________________   INITIAL IMPRESSION / ASSESSMENT AND PLAN / ED COURSE  Pertinent labs & imaging results that were available during my care of the patient were reviewed by me and considered in my medical decision making (see chart for details).  DDX: Asthma, copd, CHF, pna, ptx, malignancy, Pe, anemia   [name] is a 36 y.o. who presents to the ED with symptoms as described above.  She is afebrile hemodynamically stable.  No evidence of respiratory distress.  Physical exam as above.  This not clinically consistent with ACS.  She has a heart score of 2 and has a negative troponin after 3 days of chest pain.  Is not clinically consistent with pulmonary embolism.  She is low risk by Wells criteria and is PERC negative.  Not clinically consistent with dissection.  Possible component of pleurisy.  Given her wheeze will have her increase her prednisone temporarily for burst and then resume her chronic steroid use as well as provide Tessalon Perles and inhaler.  Have discussed with the patient and available family all diagnostics and treatments performed thus far and all questions were answered to the best of my ability. The patient demonstrates understanding and agreement with plan.       ____________________________________________   FINAL CLINICAL IMPRESSION(S) / ED DIAGNOSES  Final diagnoses:  Chest pain, unspecified type  Cough in adult      NEW MEDICATIONS STARTED DURING THIS VISIT:  This SmartLink is deprecated. Use AVSMEDLIST instead to display the medication list for a patient.   Note:  This document was prepared using Dragon voice recognition software and may include unintentional dictation errors.    Merlyn Lot, MD 05/26/17 [phone]

## 2017-05-26 NOTE — ED Notes (Signed)
Pt discharged with spacer. Ordered previously changed per pharmacy order. Order still will not sign off. Or give option to override

## 2017-05-30 ENCOUNTER — Ambulatory Visit (INDEPENDENT_AMBULATORY_CARE_PROVIDER_SITE_OTHER): Payer: 59 | Admitting: Obstetrics & Gynecology

## 2017-05-30 ENCOUNTER — Encounter: Payer: Self-pay | Admitting: Obstetrics & Gynecology

## 2017-05-30 VITALS — BP 128/87 | HR 74 | Ht 64.0 in | Wt 186.0 lb

## 2017-05-30 DIAGNOSIS — R232 Flushing: Secondary | ICD-10-CM | POA: Diagnosis not present

## 2017-05-30 DIAGNOSIS — Z1151 Encounter for screening for human papillomavirus (HPV): Secondary | ICD-10-CM

## 2017-05-30 DIAGNOSIS — Z01419 Encounter for gynecological examination (general) (routine) without abnormal findings: Secondary | ICD-10-CM

## 2017-05-30 DIAGNOSIS — Z124 Encounter for screening for malignant neoplasm of cervix: Secondary | ICD-10-CM

## 2017-05-30 NOTE — Progress Notes (Signed)
Subjective:    [name] is a 36 y.o. married P4  female who presents for an annual exam. She has had bronchitis for the last month and has now had some GSUI events again. She has been having hot flashes for about 6 weeks. Periods are shorter and more like q 3 weeks. She is taking Tessilon pearls and is being referred to a pulmonologist. The patient is sexually active. GYN screening history: last pap: was normal. The patient wears seatbelts: yes. The patient participates in regular exercise: yes. Has the patient ever been transfused or tattooed?: yes. The patient reports that there is not domestic violence in her life.   Menstrual History: OB History    Gravida Para Term Preterm AB Living   7 4 4  0 3 4   SAB TAB Ectopic Multiple Live Births   1 2 0 0 4      Menarche age: 75 Patient's last menstrual period was 05/07/2017.    The following portions of the patient's history were reviewed and updated as appropriate: allergies, current medications, past family history, past medical history, past social history, past surgical history and problem list.  Review of Systems Pertinent items are noted in HPI.   + breast- mom's sister, mgreat aunt, pat great aunt, no colon cancer, + cervical dysplasia in sister Married for 11 years    Objective:    BP 128/87   Pulse 74   Ht 5\' 4"  (1.626 m)   Wt 186 lb (84.4 kg)   LMP 05/07/2017   Breastfeeding? No   BMI 31.93 kg/m   General Appearance:    Alert, cooperative, no distress, appears stated age  Head:    Normocephalic, without obvious abnormality, atraumatic  Eyes:    PERRL, conjunctiva/corneas clear, EOM's intact, fundi    benign, both eyes  Ears:    Normal TM's and external ear canals, both ears  Nose:   Nares normal, septum midline, mucosa normal, no drainage    or sinus tenderness  Throat:   Lips, mucosa, and tongue normal; teeth and gums normal  Neck:   Supple, symmetrical, trachea midline, no adenopathy;    thyroid:  no  enlargement/tenderness/nodules; no carotid   bruit or JVD  Back:     Symmetric, no curvature, ROM normal, no CVA tenderness  Lungs:     Clear to auscultation bilaterally, respirations unlabored  Chest Wall:    No tenderness or deformity   Heart:    Regular rate and rhythm, S1 and S2 normal, no murmur, rub   or gallop  Breast Exam:    No tenderness, masses, or nipple abnormality  Abdomen:     Soft, non-tender, bowel sounds active all four quadrants,    no masses, no organomegaly  Genitalia:    Normal female without lesion, discharge or tenderness     Extremities:   Extremities normal, atraumatic, no cyanosis or edema  Pulses:   2+ and symmetric all extremities  Skin:   Skin color, texture, turgor normal, no rashes or lesions  Lymph nodes:   Cervical, supraclavicular, and axillary nodes normal  Neurologic:   CNII-XII intact, normal strength, sensation and reflexes    throughout  .    Assessment:    Healthy female exam.    Plan:     Thin prep Pap smear. with cotesting Check FSH Rec Kegels and rec get cough fixed/controlled

## 2017-05-30 NOTE — Progress Notes (Signed)
Last pap 08/2014 - Normal pap smear and negative high-risk HPV

## 2017-05-31 DIAGNOSIS — H53143 Visual discomfort, bilateral: Secondary | ICD-10-CM | POA: Diagnosis not present

## 2017-05-31 LAB — FOLLICLE STIMULATING HORMONE: FSH: 5.5 m[IU]/mL

## 2017-06-03 DIAGNOSIS — M545 Low back pain: Secondary | ICD-10-CM | POA: Diagnosis not present

## 2017-06-04 LAB — CYTOLOGY - PAP
DIAGNOSIS: NEGATIVE
HPV (WINDOPATH): NOT DETECTED

## 2017-06-11 DIAGNOSIS — M059 Rheumatoid arthritis with rheumatoid factor, unspecified: Secondary | ICD-10-CM | POA: Diagnosis not present

## 2017-06-11 DIAGNOSIS — I10 Essential (primary) hypertension: Secondary | ICD-10-CM | POA: Diagnosis not present

## 2017-06-11 DIAGNOSIS — M545 Low back pain: Secondary | ICD-10-CM | POA: Diagnosis not present

## 2017-07-23 DIAGNOSIS — M059 Rheumatoid arthritis with rheumatoid factor, unspecified: Secondary | ICD-10-CM | POA: Diagnosis not present

## 2017-08-06 DIAGNOSIS — M059 Rheumatoid arthritis with rheumatoid factor, unspecified: Secondary | ICD-10-CM | POA: Diagnosis not present

## 2017-08-09 DIAGNOSIS — I10 Essential (primary) hypertension: Secondary | ICD-10-CM | POA: Diagnosis not present

## 2017-08-09 DIAGNOSIS — M059 Rheumatoid arthritis with rheumatoid factor, unspecified: Secondary | ICD-10-CM | POA: Diagnosis not present

## 2017-08-09 DIAGNOSIS — R1084 Generalized abdominal pain: Secondary | ICD-10-CM | POA: Diagnosis not present

## 2017-08-09 DIAGNOSIS — E781 Pure hyperglyceridemia: Secondary | ICD-10-CM | POA: Diagnosis not present

## 2017-08-09 DIAGNOSIS — E559 Vitamin D deficiency, unspecified: Secondary | ICD-10-CM | POA: Diagnosis not present

## 2017-08-12 DIAGNOSIS — Z79899 Other long term (current) drug therapy: Secondary | ICD-10-CM | POA: Diagnosis not present

## 2017-08-12 DIAGNOSIS — M059 Rheumatoid arthritis with rheumatoid factor, unspecified: Secondary | ICD-10-CM | POA: Diagnosis not present

## 2017-08-19 DIAGNOSIS — I10 Essential (primary) hypertension: Secondary | ICD-10-CM | POA: Diagnosis not present

## 2017-08-19 DIAGNOSIS — E781 Pure hyperglyceridemia: Secondary | ICD-10-CM | POA: Diagnosis not present

## 2017-08-19 DIAGNOSIS — M059 Rheumatoid arthritis with rheumatoid factor, unspecified: Secondary | ICD-10-CM | POA: Diagnosis not present

## 2017-09-03 DIAGNOSIS — M059 Rheumatoid arthritis with rheumatoid factor, unspecified: Secondary | ICD-10-CM | POA: Diagnosis not present

## 2017-10-25 DIAGNOSIS — J0100 Acute maxillary sinusitis, unspecified: Secondary | ICD-10-CM | POA: Diagnosis not present

## 2017-10-25 DIAGNOSIS — J069 Acute upper respiratory infection, unspecified: Secondary | ICD-10-CM | POA: Diagnosis not present

## 2017-10-25 DIAGNOSIS — R52 Pain, unspecified: Secondary | ICD-10-CM | POA: Diagnosis not present

## 2017-10-29 DIAGNOSIS — M059 Rheumatoid arthritis with rheumatoid factor, unspecified: Secondary | ICD-10-CM | POA: Diagnosis not present

## 2017-11-04 DIAGNOSIS — M059 Rheumatoid arthritis with rheumatoid factor, unspecified: Secondary | ICD-10-CM | POA: Diagnosis not present

## 2017-11-04 DIAGNOSIS — R5382 Chronic fatigue, unspecified: Secondary | ICD-10-CM | POA: Diagnosis not present

## 2017-11-04 DIAGNOSIS — I10 Essential (primary) hypertension: Secondary | ICD-10-CM | POA: Diagnosis not present

## 2017-11-04 DIAGNOSIS — R200 Anesthesia of skin: Secondary | ICD-10-CM | POA: Diagnosis not present

## 2017-11-04 DIAGNOSIS — R5381 Other malaise: Secondary | ICD-10-CM | POA: Diagnosis not present

## 2017-11-04 DIAGNOSIS — M25531 Pain in right wrist: Secondary | ICD-10-CM | POA: Diagnosis not present

## 2017-11-05 DIAGNOSIS — R200 Anesthesia of skin: Secondary | ICD-10-CM | POA: Diagnosis not present

## 2017-11-05 DIAGNOSIS — M79641 Pain in right hand: Secondary | ICD-10-CM | POA: Diagnosis not present

## 2017-11-05 DIAGNOSIS — M79642 Pain in left hand: Secondary | ICD-10-CM | POA: Diagnosis not present

## 2017-11-13 DIAGNOSIS — M898X5 Other specified disorders of bone, thigh: Secondary | ICD-10-CM | POA: Diagnosis not present

## 2017-11-13 DIAGNOSIS — M79651 Pain in right thigh: Secondary | ICD-10-CM | POA: Diagnosis not present

## 2017-11-13 DIAGNOSIS — Z79899 Other long term (current) drug therapy: Secondary | ICD-10-CM | POA: Diagnosis not present

## 2017-11-13 DIAGNOSIS — M059 Rheumatoid arthritis with rheumatoid factor, unspecified: Secondary | ICD-10-CM | POA: Diagnosis not present

## 2017-11-13 DIAGNOSIS — G5603 Carpal tunnel syndrome, bilateral upper limbs: Secondary | ICD-10-CM | POA: Insufficient documentation

## 2017-11-20 DIAGNOSIS — M059 Rheumatoid arthritis with rheumatoid factor, unspecified: Secondary | ICD-10-CM | POA: Diagnosis not present

## 2017-11-20 DIAGNOSIS — I10 Essential (primary) hypertension: Secondary | ICD-10-CM | POA: Diagnosis not present

## 2017-11-20 DIAGNOSIS — E781 Pure hyperglyceridemia: Secondary | ICD-10-CM | POA: Diagnosis not present

## 2017-11-28 DIAGNOSIS — M059 Rheumatoid arthritis with rheumatoid factor, unspecified: Secondary | ICD-10-CM | POA: Diagnosis not present

## 2017-11-28 DIAGNOSIS — J069 Acute upper respiratory infection, unspecified: Secondary | ICD-10-CM | POA: Diagnosis not present

## 2017-12-10 DIAGNOSIS — M059 Rheumatoid arthritis with rheumatoid factor, unspecified: Secondary | ICD-10-CM | POA: Diagnosis not present

## 2017-12-20 DIAGNOSIS — M069 Rheumatoid arthritis, unspecified: Secondary | ICD-10-CM | POA: Diagnosis not present

## 2017-12-20 DIAGNOSIS — M545 Low back pain: Secondary | ICD-10-CM | POA: Diagnosis not present

## 2017-12-31 DIAGNOSIS — R5382 Chronic fatigue, unspecified: Secondary | ICD-10-CM | POA: Diagnosis not present

## 2017-12-31 DIAGNOSIS — M2550 Pain in unspecified joint: Secondary | ICD-10-CM | POA: Diagnosis not present

## 2017-12-31 DIAGNOSIS — M059 Rheumatoid arthritis with rheumatoid factor, unspecified: Secondary | ICD-10-CM | POA: Diagnosis not present

## 2018-01-09 ENCOUNTER — Encounter: Payer: Self-pay | Admitting: Family Medicine

## 2018-01-09 ENCOUNTER — Ambulatory Visit (INDEPENDENT_AMBULATORY_CARE_PROVIDER_SITE_OTHER): Payer: 59 | Admitting: Family Medicine

## 2018-01-09 VITALS — BP 113/81 | HR 89 | Wt 195.2 lb

## 2018-01-09 DIAGNOSIS — N920 Excessive and frequent menstruation with regular cycle: Secondary | ICD-10-CM | POA: Insufficient documentation

## 2018-01-09 MED ORDER — MEGESTROL ACETATE 40 MG PO TABS: 40.0000 mg | ORAL_TABLET | Freq: Two times a day (BID) | ORAL | 3 refills | Status: DC

## 2018-01-09 NOTE — Progress Notes (Signed)
   Subjective:    Patient ID: [name] is a 37 y.o. female presenting with Discuss Ablasion  on 01/09/2018  HPI: S/p BTL. Now she is noting that her cycles are heavier. They are clotting and painful. They remain regular. She has been on contraception or having babies for the last 11 years and has not had a regular cycle, until having her tubes tied. She is desiring an endometrial ablation.  Review of Systems  Constitutional: Negative for chills and fever.  Respiratory: Negative for shortness of breath.   Cardiovascular: Negative for chest pain.  Gastrointestinal: Negative for abdominal pain, nausea and vomiting.  Genitourinary: Negative for dysuria.  Skin: Negative for rash.      Objective:    BP 113/81   Pulse 89   Wt 195 lb 3.2 oz (88.5 kg)   BMI 33.51 kg/m  Physical Exam  Constitutional: She is oriented to person, place, and time. She appears well-developed and well-nourished. No distress.  HENT:  Head: Normocephalic and atraumatic.  Eyes: No scleral icterus.  Neck: Neck supple.  Cardiovascular: Normal rate.  Pulmonary/Chest: Effort normal.  Abdominal: Soft.  Neurological: She is alert and oriented to person, place, and time.  Skin: Skin is warm and dry.  Psychiatric: She has a normal mood and affect.        Assessment & Plan:  . Problem List Items Addressed This Visit      Unprioritized   Menorrhagia with regular cycle - Primary    After review of options we will proceed with endometrial ablation. She desires outpatient surgery with less recovery than hysterectomy. She is already s/p Anterior repair with her BTL. Risks of surgery discussed. Risks include but are not limited to bleeding, infection, injury to surrounding structures, including bowel, bladder and ureters, blood clots, and death.  Likelihood of success is high.       Relevant Medications   megestrol (MEGACE) 40 MG tablet    Megace to control bleeding prior to procedure and to thin out  lining.  Total face-to-face time with patient: 25 minutes. Over 50% of encounter was spent on counseling and coordination of Mueller. Return in about 3 months (around 04/11/2018) for postop check.  Donnamae Jude 01/09/2018 11:10 AM

## 2018-01-09 NOTE — Patient Instructions (Signed)
Endometrial Ablation Endometrial ablation is a procedure that destroys the thin inner layer of the lining of the uterus (endometrium). This procedure may be done:  To stop heavy periods.  To stop bleeding that is causing anemia.  To control irregular bleeding.  To treat bleeding caused by small tumors (fibroids) in the endometrium.  This procedure is often an alternative to major surgery, such as removal of the uterus and cervix (hysterectomy). As a result of this procedure:  You may not be able to have children. However, if you are premenopausal (you have not gone through menopause): ? You may still have a small chance of getting pregnant. ? You will need to use a reliable method of birth control after the procedure to prevent pregnancy.  You may stop having a menstrual period, or you may have only a small amount of bleeding during your period. Menstruation may return several years after the procedure.  Tell a health care provider about:  Any allergies you have.  All medicines you are taking, including vitamins, herbs, eye drops, creams, and over-the-counter medicines.  Any problems you or family members have had with the use of anesthetic medicines.  Any blood disorders you have.  Any surgeries you have had.  Any medical conditions you have. What are the risks? Generally, this is a safe procedure. However, problems may occur, including:  A hole (perforation) in the uterus or bowel.  Infection of the uterus, bladder, or vagina.  Bleeding.  Damage to other structures or organs.  An air bubble in the lung (air embolus).  Problems with pregnancy after the procedure.  Failure of the procedure.  Decreased ability to diagnose cancer in the endometrium.  What happens before the procedure?  You will have tests of your endometrium to make sure there are no pre-cancerous cells or cancer cells present.  You may have an ultrasound of the uterus.  You may be given  medicines to thin the endometrium.  Ask your health care provider about: ? Changing or stopping your regular medicines. This is especially important if you take diabetes medicines or blood thinners. ? Taking medicines such as aspirin and ibuprofen. These medicines can thin your blood. Do not take these medicines before your procedure if your doctor tells you not to.  Plan to have someone take you home from the hospital or clinic. What happens during the procedure?  You will lie on an exam table with your feet and legs supported as in a pelvic exam.  To lower your risk of infection: ? Your health care team will wash or sanitize their hands and put on germ-free (sterile) gloves. ? Your genital area will be washed with soap.  An IV tube will be inserted into one of your veins.  You will be given a medicine to help you relax (sedative).  A surgical instrument with a light and camera (resectoscope) will be inserted into your vagina and moved into your uterus. This allows your surgeon to see inside your uterus.  Endometrial tissue will be removed using one of the following methods: ? Radiofrequency. This method uses a radiofrequency-alternating electric current to remove the endometrium. ? Cryotherapy. This method uses extreme cold to freeze the endometrium. ? Heated-free liquid. This method uses a heated saltwater (saline) solution to remove the endometrium. ? Microwave. This method uses high-energy microwaves to heat up the endometrium and remove it. ? Thermal balloon. This method involves inserting a catheter with a balloon tip into the uterus. The balloon tip is   filled with heated fluid to remove the endometrium. The procedure may vary among health care providers and hospitals. What happens after the procedure?  Your blood pressure, heart rate, breathing rate, and blood oxygen level will be monitored until the medicines you were given have worn off.  As tissue healing occurs, you may  notice vaginal bleeding for 4-6 weeks after the procedure. You may also experience: ? Cramps. ? Thin, watery vaginal discharge that is light pink or brown in color. ? A need to urinate more frequently than usual. ? Nausea.  Do not drive for 24 hours if you were given a sedative.  Do not have sex or insert anything into your vagina until your health care provider approves. Summary  Endometrial ablation is done to treat the many causes of heavy menstrual bleeding.  The procedure may be done only after medications have been tried to control the bleeding.  Plan to have someone take you home from the hospital or clinic. This information is not intended to replace advice given to you by your health care provider. Make sure you discuss any questions you have with your health care provider. Document Released: 04/27/2004 Document Revised: 07/05/2016 Document Reviewed: 07/05/2016 Elsevier Interactive Patient Education  2017 [employer]  

## 2018-01-09 NOTE — Assessment & Plan Note (Signed)
After review of options we will proceed with endometrial ablation. She desires outpatient surgery with less recovery than hysterectomy. She is already s/p Anterior repair with her BTL. Risks of surgery discussed. Risks include but are not limited to bleeding, infection, injury to surrounding structures, including bowel, bladder and ureters, blood clots, and death.  Likelihood of success is high.

## 2018-01-13 ENCOUNTER — Encounter (HOSPITAL_COMMUNITY): Payer: Self-pay

## 2018-01-21 DIAGNOSIS — R21 Rash and other nonspecific skin eruption: Secondary | ICD-10-CM | POA: Diagnosis not present

## 2018-01-21 DIAGNOSIS — M059 Rheumatoid arthritis with rheumatoid factor, unspecified: Secondary | ICD-10-CM | POA: Diagnosis not present

## 2018-01-23 DIAGNOSIS — M069 Rheumatoid arthritis, unspecified: Secondary | ICD-10-CM | POA: Diagnosis not present

## 2018-01-23 DIAGNOSIS — L710 Perioral dermatitis: Secondary | ICD-10-CM | POA: Diagnosis not present

## 2018-01-27 DIAGNOSIS — G5603 Carpal tunnel syndrome, bilateral upper limbs: Secondary | ICD-10-CM | POA: Diagnosis not present

## 2018-01-27 DIAGNOSIS — Z79899 Other long term (current) drug therapy: Secondary | ICD-10-CM | POA: Diagnosis not present

## 2018-01-27 DIAGNOSIS — M059 Rheumatoid arthritis with rheumatoid factor, unspecified: Secondary | ICD-10-CM | POA: Diagnosis not present

## 2018-02-05 ENCOUNTER — Encounter (HOSPITAL_COMMUNITY): Payer: Self-pay

## 2018-02-05 DIAGNOSIS — I10 Essential (primary) hypertension: Secondary | ICD-10-CM | POA: Diagnosis not present

## 2018-02-05 DIAGNOSIS — E559 Vitamin D deficiency, unspecified: Secondary | ICD-10-CM | POA: Diagnosis not present

## 2018-02-05 DIAGNOSIS — Z79899 Other long term (current) drug therapy: Secondary | ICD-10-CM | POA: Diagnosis not present

## 2018-02-05 DIAGNOSIS — M059 Rheumatoid arthritis with rheumatoid factor, unspecified: Secondary | ICD-10-CM | POA: Diagnosis not present

## 2018-02-05 DIAGNOSIS — E781 Pure hyperglyceridemia: Secondary | ICD-10-CM | POA: Diagnosis not present

## 2018-02-05 NOTE — H&P (Addendum)
[name] is an 37 y.o. [id number] female.   Chief Complaint: abnormal uterine bleeding HPI: S/p BTL. Now she is noting that her cycles are heavier. They are clotting and painful. They remain regular. She has been on contraception or having babies for the last 11 years and has not had a regular cycle, until having her tubes tied. She is desiring an endometrial ablation.   Past Medical History:  Diagnosis Date  . Anemia    with pregnancy  . Depression    PPD  . Gestational diabetes    with 1st pregnancy  . Glomerulonephritis   . Hypertension   . Kidney infection   . Migraine   . PONV (postoperative nausea and vomiting)    crying  . Rheumatoid arthritis ([place])   . Urinary tract infection     Past Surgical History:  Procedure Laterality Date  . CHOLECYSTECTOMY N/A 11/2013  . CYSTOCELE REPAIR N/A 08/28/2016   Procedure: ANTERIOR REPAIR (CYSTOCELE);  Surgeon: Emily Filbert, MD;  Location: [facility];  Service: Gynecology;  Laterality: N/A;  . DILATION AND EVACUATION  03/09/2011   Procedure: DILATATION AND EVACUATION (D&E);  Surgeon: Donnamae Jude, MD;  Location: [facility];  Service: Gynecology;  Laterality: N/A;  . LAPAROSCOPIC BILATERAL SALPINGECTOMY Bilateral 08/28/2016   Procedure: LAPAROSCOPIC BILATERAL SALPINGECTOMY;  Surgeon: Emily Filbert, MD;  Location: [facility];  Service: Gynecology;  Laterality: Bilateral;  . PERINEOPLASTY N/A 08/28/2016   Procedure: PERINEOPLASTY;  Surgeon: Emily Filbert, MD;  Location: [facility];  Service: Gynecology;  Laterality: N/A;  . TONSILLECTOMY  1988  . Tubes in ears  Baby    Family History  Problem Relation Age of Onset  . Hypertension Father   . Heart failure Father   . Hepatitis Father   . Cirrhosis Father   . Hyperlipidemia Father   . Migraines Father   . Mental illness Father        Depression  . Diabetes Mother   . Osteoarthritis Mother   . Hypertension Mother   . Hypothyroidism Mother   . Heart disease Mother   . Hyperlipidemia Mother   .  Migraines Mother   . Mental illness Mother        Depression  . Breast cancer Maternal Aunt 70  . Cancer Sister 13       Cervical  . Heart disease Sister        Heart Stops  . Migraines Sister   . Seizures Sister 11       unsure if this or heart problem  . Mental illness Sister        depression  . Heart disease Maternal Grandmother    Social History:  reports that she quit smoking about 14 months ago. Her smoking use included cigarettes. She has a 8.00 pack-year smoking history. She has never used smokeless tobacco. She reports that she does not drink alcohol or use drugs.  Allergies: No Known Allergies  No medications prior to admission.    A comprehensive review of systems was negative.  not currently breastfeeding. General appearance: alert, cooperative and appears stated age Head: Normocephalic, without obvious abnormality, atraumatic Neck: supple, symmetrical, trachea midline Lungs: normal effort Heart: regular rate and rhythm Abdomen: soft, non-tender; bowel sounds normal; no masses,  no organomegaly Extremities: extremities normal, atraumatic, no cyanosis or edema Skin: Skin color, texture, turgor normal. No rashes or lesions Neurologic: Grossly normal   Lab Results  Component Value Date   WBC 8.1 05/26/2017  HGB 12.9 05/26/2017   HCT 36.0 05/26/2017   MCV 93.4 05/26/2017   PLT 322 05/26/2017   Lab Results  Component Value Date   PREGTESTUR NEGATIVE 08/28/2016     Assessment/Plan Principal Problem:   Menorrhagia with regular cycle  For D & C with [place]a ablation Risks include but are not limited to bleeding, infection, injury to surrounding structures, including bowel, bladder and ureters, blood clots, and death.  Likelihood of success is high.    Donnamae Jude 02/05/2018, 6:05 PM

## 2018-02-13 NOTE — Telephone Encounter (Signed)
Spoke with patient concerning passing clots and pain. Patient reports not soaking 1-2 pads per hour but is concern due to the clots. I have advised patient to follow up with [facility] to been check out since we do not have a provider her this afternoon. Patient voice understanding.

## 2018-02-28 DIAGNOSIS — E781 Pure hyperglyceridemia: Secondary | ICD-10-CM | POA: Diagnosis not present

## 2018-02-28 DIAGNOSIS — I10 Essential (primary) hypertension: Secondary | ICD-10-CM | POA: Diagnosis not present

## 2018-02-28 DIAGNOSIS — R300 Dysuria: Secondary | ICD-10-CM | POA: Diagnosis not present

## 2018-02-28 DIAGNOSIS — M059 Rheumatoid arthritis with rheumatoid factor, unspecified: Secondary | ICD-10-CM | POA: Diagnosis not present

## 2018-03-02 DIAGNOSIS — N920 Excessive and frequent menstruation with regular cycle: Secondary | ICD-10-CM

## 2018-03-02 HISTORY — DX: Excessive and frequent menstruation with regular cycle: N92.0

## 2018-03-10 DIAGNOSIS — L918 Other hypertrophic disorders of the skin: Secondary | ICD-10-CM | POA: Diagnosis not present

## 2018-03-10 DIAGNOSIS — L719 Rosacea, unspecified: Secondary | ICD-10-CM | POA: Diagnosis not present

## 2018-03-10 DIAGNOSIS — L710 Perioral dermatitis: Secondary | ICD-10-CM | POA: Diagnosis not present

## 2018-03-10 NOTE — H&P (Signed)
[name] is an 37 y.o. [id number] female.   Chief Complaint: abnormal uterine bleeding HPI: S/p BTL. Now she is noting that her cycles are heavier. They are clotting and painful.They remain regular. She has been on contraception or having babies for the last 11 years and has not had a regular cycle, until having her tubes tied. She is desiring an endometrial ablation.       Past Medical History:  Diagnosis Date  . Anemia    with pregnancy  . Depression    PPD  . Gestational diabetes    with 1st pregnancy  . Glomerulonephritis   . Hypertension   . Kidney infection   . Migraine   . PONV (postoperative nausea and vomiting)    crying  . Rheumatoid arthritis ([place])   . Urinary tract infection          Past Surgical History:  Procedure Laterality Date  . CHOLECYSTECTOMY N/A 11/2013  . CYSTOCELE REPAIR N/A 08/28/2016   Procedure: ANTERIOR REPAIR (CYSTOCELE);  Surgeon: Emily Filbert, MD;  Location: [facility];  Service: Gynecology;  Laterality: N/A;  . DILATION AND EVACUATION  03/09/2011   Procedure: DILATATION AND EVACUATION (D&E);  Surgeon: Donnamae Jude, MD;  Location: [facility];  Service: Gynecology;  Laterality: N/A;  . LAPAROSCOPIC BILATERAL SALPINGECTOMY Bilateral 08/28/2016   Procedure: LAPAROSCOPIC BILATERAL SALPINGECTOMY;  Surgeon: Emily Filbert, MD;  Location: [facility];  Service: Gynecology;  Laterality: Bilateral;  . PERINEOPLASTY N/A 08/28/2016   Procedure: PERINEOPLASTY;  Surgeon: Emily Filbert, MD;  Location: [facility];  Service: Gynecology;  Laterality: N/A;  . TONSILLECTOMY  1988  . Tubes in ears  Baby         Family History  Problem Relation Age of Onset  . Hypertension Father   . Heart failure Father   . Hepatitis Father   . Cirrhosis Father   . Hyperlipidemia Father   . Migraines Father   . Mental illness Father        Depression  . Diabetes Mother   . Osteoarthritis Mother   . Hypertension Mother   . Hypothyroidism Mother   .  Heart disease Mother   . Hyperlipidemia Mother   . Migraines Mother   . Mental illness Mother        Depression  . Breast cancer Maternal Aunt 70  . Cancer Sister 54       Cervical  . Heart disease Sister        Heart Stops  . Migraines Sister   . Seizures Sister 11       unsure if this or heart problem  . Mental illness Sister        depression  . Heart disease Maternal Grandmother    Social History:  reports that she quit smoking about 14 months ago. Her smoking use included cigarettes. She has a 8.00 pack-year smoking history. She has never used smokeless tobacco. She reports that she does not drink alcohol or use drugs.  Allergies: No Known Allergies  No medications prior to admission.    A comprehensive review of systems was negative.  not currently breastfeeding. General appearance: alert, cooperative and appears stated age Head: Normocephalic, without obvious abnormality, atraumatic Neck: supple, symmetrical, trachea midline Lungs: normal effort Heart: regular rate and rhythm Abdomen: soft, non-tender; bowel sounds normal; no masses,  no organomegaly Extremities: extremities normal, atraumatic, no cyanosis or edema Skin: Skin color, texture, turgor normal. No rashes or lesions Neurologic: Grossly normal  RecentLabs       Lab Results  Component Value Date   WBC 8.1 05/26/2017   HGB 12.9 05/26/2017   HCT 36.0 05/26/2017   MCV 93.4 05/26/2017   PLT 322 05/26/2017     RecentLabs       Lab Results  Component Value Date   PREGTESTUR NEGATIVE 08/28/2016       Assessment/Plan Principal Problem:   Menorrhagia with regular cycle  For D & C with Minerva ablation Risks include but are not limited to bleeding, infection, injury to surrounding structures, including bowel, bladder and ureters, blood clots, and death.  Likelihood of success is high.  Donnamae Jude 03/10/2018 9:45 AM

## 2018-03-11 ENCOUNTER — Other Ambulatory Visit: Payer: Self-pay

## 2018-03-11 ENCOUNTER — Encounter (HOSPITAL_BASED_OUTPATIENT_CLINIC_OR_DEPARTMENT_OTHER): Payer: Self-pay | Admitting: *Deleted

## 2018-03-11 NOTE — Pre-Procedure Instructions (Signed)
To come for BMET, CBC and urine preg.

## 2018-03-18 ENCOUNTER — Encounter (HOSPITAL_BASED_OUTPATIENT_CLINIC_OR_DEPARTMENT_OTHER)
Admission: RE | Admit: 2018-03-18 | Discharge: 2018-03-18 | Disposition: A | Discharge status: Home / Self Care | Payer: 59 | Source: Ambulatory Visit | Attending: Family Medicine | Admitting: Family Medicine

## 2018-03-18 DIAGNOSIS — M069 Rheumatoid arthritis, unspecified: Secondary | ICD-10-CM | POA: Diagnosis not present

## 2018-03-18 DIAGNOSIS — Z79899 Other long term (current) drug therapy: Secondary | ICD-10-CM | POA: Diagnosis not present

## 2018-03-18 DIAGNOSIS — F329 Major depressive disorder, single episode, unspecified: Secondary | ICD-10-CM | POA: Diagnosis not present

## 2018-03-18 DIAGNOSIS — I10 Essential (primary) hypertension: Secondary | ICD-10-CM | POA: Diagnosis not present

## 2018-03-18 DIAGNOSIS — N920 Excessive and frequent menstruation with regular cycle: Secondary | ICD-10-CM | POA: Diagnosis not present

## 2018-03-18 DIAGNOSIS — Z87891 Personal history of nicotine dependence: Secondary | ICD-10-CM | POA: Diagnosis not present

## 2018-03-18 DIAGNOSIS — Z9851 Tubal ligation status: Secondary | ICD-10-CM | POA: Diagnosis not present

## 2018-03-18 LAB — BASIC METABOLIC PANEL
ANION GAP: 8 (ref 5–15)
BUN: 12 mg/dL (ref 6–20)
CHLORIDE: 108 mmol/L (ref 98–111)
CO2: 25 mmol/L (ref 22–32)
CREATININE: 0.75 mg/dL (ref 0.44–1.00)
Calcium: 10 mg/dL (ref 8.9–10.3)
GFR calc non Af Amer: >60 mL/min (ref >60)
Glucose, Bld: 90 mg/dL (ref 70–99)
Potassium: 4.3 mmol/L (ref 3.5–5.1)
Sodium: 141 mmol/L (ref 135–145)

## 2018-03-18 LAB — POCT PREGNANCY, URINE: PREG TEST UR: NEGATIVE

## 2018-03-18 LAB — CBC
HCT: 37 % (ref 36.0–46.0)
HEMOGLOBIN: 12.7 g/dL (ref 12.0–15.0)
MCH: 30.8 pg (ref 26.0–34.0)
MCHC: 34.3 g/dL (ref 30.0–36.0)
MCV: 89.6 fL (ref 78.0–100.0)
Platelets: 250 10*3/uL (ref 150–400)
RBC: 4.13 MIL/uL (ref 3.87–5.11)
RDW: 12.8 % (ref 11.5–15.5)
WBC: 7.1 10*3/uL (ref 4.0–10.5)

## 2018-03-19 ENCOUNTER — Encounter (HOSPITAL_BASED_OUTPATIENT_CLINIC_OR_DEPARTMENT_OTHER): Payer: Self-pay | Admitting: Anesthesiology

## 2018-03-19 ENCOUNTER — Ambulatory Visit (HOSPITAL_BASED_OUTPATIENT_CLINIC_OR_DEPARTMENT_OTHER)
Admission: RE | Admit: 2018-03-19 | Discharge: 2018-03-19 | Disposition: A | Discharge status: Home / Self Care | Payer: 59 | Source: Ambulatory Visit | Attending: Family Medicine | Admitting: Family Medicine

## 2018-03-19 ENCOUNTER — Ambulatory Visit (HOSPITAL_BASED_OUTPATIENT_CLINIC_OR_DEPARTMENT_OTHER): Payer: 59 | Admitting: Anesthesiology

## 2018-03-19 ENCOUNTER — Other Ambulatory Visit: Payer: Self-pay

## 2018-03-19 ENCOUNTER — Encounter (HOSPITAL_BASED_OUTPATIENT_CLINIC_OR_DEPARTMENT_OTHER)
Admission: RE | Disposition: A | Discharge status: Home / Self Care | Payer: Self-pay | Source: Ambulatory Visit | Attending: Family Medicine

## 2018-03-19 DIAGNOSIS — Z9851 Tubal ligation status: Secondary | ICD-10-CM | POA: Insufficient documentation

## 2018-03-19 DIAGNOSIS — N920 Excessive and frequent menstruation with regular cycle: Secondary | ICD-10-CM | POA: Diagnosis not present

## 2018-03-19 DIAGNOSIS — Z79899 Other long term (current) drug therapy: Secondary | ICD-10-CM | POA: Insufficient documentation

## 2018-03-19 DIAGNOSIS — F329 Major depressive disorder, single episode, unspecified: Secondary | ICD-10-CM | POA: Insufficient documentation

## 2018-03-19 DIAGNOSIS — I10 Essential (primary) hypertension: Secondary | ICD-10-CM | POA: Diagnosis not present

## 2018-03-19 DIAGNOSIS — M069 Rheumatoid arthritis, unspecified: Secondary | ICD-10-CM | POA: Insufficient documentation

## 2018-03-19 DIAGNOSIS — Z87891 Personal history of nicotine dependence: Secondary | ICD-10-CM | POA: Insufficient documentation

## 2018-03-19 HISTORY — DX: Other complications of anesthesia, initial encounter: T88.59XA

## 2018-03-19 HISTORY — DX: Adverse effect of unspecified anesthetic, initial encounter: T41.45XA

## 2018-03-19 HISTORY — PX: HYSTEROSCOPY WITH D & C: SHX1775

## 2018-03-19 HISTORY — DX: Fibromyalgia: M79.7

## 2018-03-19 HISTORY — DX: Excessive and frequent menstruation with regular cycle: N92.0

## 2018-03-19 HISTORY — DX: Migraine, unspecified, not intractable, without status migrainosus: G43.909

## 2018-03-19 SURGERY — DILATATION AND CURETTAGE /HYSTEROSCOPY
Anesthesia: General | Site: "Vagina "

## 2018-03-19 MED ORDER — BUPIVACAINE-EPINEPHRINE 0.25% -1:200000 IJ SOLN
INTRAMUSCULAR | Status: DC | PRN
  Administered 2018-03-19: 30 mL

## 2018-03-19 MED ORDER — LIDOCAINE HCL (CARDIAC) PF 100 MG/5ML IV SOSY
PREFILLED_SYRINGE | INTRAVENOUS | Status: DC | PRN
  Administered 2018-03-19: 30 mg via INTRAVENOUS

## 2018-03-19 MED ORDER — OXYCODONE HCL 5 MG/5ML PO SOLN: 5.0000 mg | Freq: Once | ORAL | Status: AC | PRN

## 2018-03-19 MED ORDER — GLYCOPYRROLATE PF 0.2 MG/ML IJ SOSY
PREFILLED_SYRINGE | INTRAMUSCULAR | Status: AC
  Filled 2018-03-19: qty 1

## 2018-03-19 MED ORDER — PROPOFOL 10 MG/ML IV BOLUS
INTRAVENOUS | Status: DC | PRN
  Administered 2018-03-19: 150 mg via INTRAVENOUS

## 2018-03-19 MED ORDER — LIDOCAINE 2% (20 MG/ML) 5 ML SYRINGE
INTRAMUSCULAR | Status: AC
  Filled 2018-03-19: qty 5

## 2018-03-19 MED ORDER — DEXAMETHASONE SODIUM PHOSPHATE 10 MG/ML IJ SOLN
INTRAMUSCULAR | Status: AC
  Filled 2018-03-19: qty 1

## 2018-03-19 MED ORDER — GLYCOPYRROLATE 0.2 MG/ML IJ SOLN
INTRAMUSCULAR | Status: DC | PRN
  Administered 2018-03-19 (×3): 0.1 mg via INTRAVENOUS

## 2018-03-19 MED ORDER — ONDANSETRON HCL 4 MG/2ML IJ SOLN
INTRAMUSCULAR | Status: AC
  Filled 2018-03-19: qty 2

## 2018-03-19 MED ORDER — LACTATED RINGERS IV SOLN: INTRAVENOUS | Status: DC

## 2018-03-19 MED ORDER — MIDAZOLAM HCL 5 MG/5ML IJ SOLN
INTRAMUSCULAR | Status: DC | PRN
  Administered 2018-03-19: 2 mg via INTRAVENOUS

## 2018-03-19 MED ORDER — FENTANYL CITRATE (PF) 100 MCG/2ML IJ SOLN
INTRAMUSCULAR | Status: DC | PRN
  Administered 2018-03-19: 50 ug via INTRAVENOUS

## 2018-03-19 MED ORDER — OXYCODONE HCL 5 MG PO TABS
5.0000 mg | ORAL_TABLET | Freq: Once | ORAL | Status: AC | PRN
  Administered 2018-03-19: 5 mg via ORAL

## 2018-03-19 MED ORDER — MIDAZOLAM HCL 2 MG/2ML IJ SOLN: 1.0000 mg | INTRAMUSCULAR | Status: DC | PRN

## 2018-03-19 MED ORDER — MEPERIDINE HCL 25 MG/ML IJ SOLN: 6.2500 mg | INTRAMUSCULAR | Status: DC | PRN

## 2018-03-19 MED ORDER — FENTANYL CITRATE (PF) 100 MCG/2ML IJ SOLN
INTRAMUSCULAR | Status: AC
  Filled 2018-03-19: qty 2

## 2018-03-19 MED ORDER — DEXAMETHASONE SODIUM PHOSPHATE 4 MG/ML IJ SOLN
INTRAMUSCULAR | Status: DC | PRN
  Administered 2018-03-19: 10 mg via INTRAVENOUS

## 2018-03-19 MED ORDER — SCOPOLAMINE 1 MG/3DAYS TD PT72
1.0000 | MEDICATED_PATCH | Freq: Once | TRANSDERMAL | Status: DC | PRN
  Administered 2018-03-19: 1.5 mg via TRANSDERMAL

## 2018-03-19 MED ORDER — OXYCODONE HCL 5 MG PO TABS
ORAL_TABLET | ORAL | Status: AC
  Filled 2018-03-19: qty 1

## 2018-03-19 MED ORDER — PROMETHAZINE HCL 25 MG/ML IJ SOLN: 6.2500 mg | INTRAMUSCULAR | Status: DC | PRN

## 2018-03-19 MED ORDER — EPHEDRINE SULFATE 50 MG/ML IJ SOLN
INTRAMUSCULAR | Status: DC | PRN
  Administered 2018-03-19 (×2): 10 mg via INTRAVENOUS

## 2018-03-19 MED ORDER — FENTANYL CITRATE (PF) 100 MCG/2ML IJ SOLN: 50.0000 ug | INTRAMUSCULAR | Status: DC | PRN

## 2018-03-19 MED ORDER — MIDAZOLAM HCL 2 MG/2ML IJ SOLN
INTRAMUSCULAR | Status: AC
  Filled 2018-03-19: qty 2

## 2018-03-19 MED ORDER — SCOPOLAMINE 1 MG/3DAYS TD PT72
MEDICATED_PATCH | TRANSDERMAL | Status: AC
  Filled 2018-03-19: qty 1

## 2018-03-19 MED ORDER — LACTATED RINGERS IV SOLN
INTRAVENOUS | Status: DC
  Administered 2018-03-19: 13:00:00 via INTRAVENOUS

## 2018-03-19 MED ORDER — FENTANYL CITRATE (PF) 100 MCG/2ML IJ SOLN: 25.0000 ug | INTRAMUSCULAR | Status: DC | PRN

## 2018-03-19 MED ORDER — OXYCODONE HCL 5 MG PO TABS: 5.0000 mg | ORAL_TABLET | Freq: Four times a day (QID) | ORAL | 0 refills | Status: DC | PRN

## 2018-03-19 SURGICAL SUPPLY — 19 items
BIPOLAR CUTTING LOOP 21FR (ELECTRODE)
BRIEF STRETCH FOR OB PAD XXL (UNDERPADS AND DIAPERS) ×3 IMPLANT
CANISTER SUCT 3000ML PPV (MISCELLANEOUS) ×3 IMPLANT
CATH ROBINSON RED A/P 16FR (CATHETERS) ×1 IMPLANT
ELECT REM PT RETURN 9FT ADLT (ELECTROSURGICAL)
ELECTRODE REM PT RTRN 9FT ADLT (ELECTROSURGICAL) IMPLANT
GLOVE BIO SURGEON STRL SZ7 (GLOVE) ×2 IMPLANT
GLOVE BIOGEL PI IND STRL 7.0 (GLOVE) ×4 IMPLANT
GLOVE BIOGEL PI INDICATOR 7.0 (GLOVE) ×2
GLOVE ECLIPSE 7.0 STRL STRAW (GLOVE) ×3 IMPLANT
GOWN STRL REUS W/TWL LRG LVL3 (GOWN DISPOSABLE) ×6 IMPLANT
HANDPIECE ABLA MINERVA ENDO (MISCELLANEOUS) ×2 IMPLANT
KIT PROCEDURE FLUENT (KITS) ×3 IMPLANT
LOOP CUTTING BIPOLAR 21FR (ELECTRODE) IMPLANT
PACK VAGINAL MINOR WOMEN LF (CUSTOM PROCEDURE TRAY) ×3 IMPLANT
PAD OB MATERNITY 4.3X12.25 (PERSONAL CARE ITEMS) ×3 IMPLANT
PAD PREP 24X48 CUFFED NSTRL (MISCELLANEOUS) ×3 IMPLANT
SLEEVE SCD COMPRESS KNEE MED (MISCELLANEOUS) ×3 IMPLANT
TOWEL GREEN STERILE FF (TOWEL DISPOSABLE) ×6 IMPLANT

## 2018-03-19 NOTE — Interval H&P Note (Signed)
History and Physical Interval Note:  03/19/2018 12:47 PM  [name]  has presented today for surgery, with the diagnosis of Menorrhagia  The various methods of treatment have been discussed with the patient and family. After consideration of risks, benefits and other options for treatment, the patient has consented to  Procedure(s): DILATION AND CURETTAGE /HYSTEROSCOPY WITH MINERVA ABLATION (N/A) as a surgical intervention .  The patient's history has been reviewed, patient examined, no change in status, stable for surgery.  I have reviewed the patient's chart and labs.  Questions were answered to the patient's satisfaction.     Donnamae Jude

## 2018-03-19 NOTE — Op Note (Signed)
Preoperative diagnosis: Menorrhagia  Postoperative diagnosis: Menorrhagia  Procedure: Minerva endometrial ablation, hysteroscopy  Surgeon: Standley Dakins. Kennon Rounds, M.D.  Anesthesia: GETT  Findings: Normal appearing cervix and uterine cavity with both ostia seen.  Estimated blood loss: Minimal  Specimens: None  Disposition of specimen: N/A  Reason for procedure: Patient is a 37 y.o. y.o. [id number] with abnormal uterine bleeding who desired permanent treatment.   Procedure: Patient was taken to the OR where she was placed in dorsal lithotomy in [place]. SCDs were in place. An adequate timeout was obtained. The patient was prepped and draped in the usual sterile fashion. A red rubber catheter is used to drain her bladder. A speculum was placed inside the vagina and the cervix visualized. The cervix was grasped anteriorly with a single-tooth tenaculum. 20 cc of quarter percent Marcaine were injected for paracervical block. The uterus sounded to 8 cm. Cervix measured 3 cm. Sequential dilation was done hysteroscope was introduced into the uterine cavity. The cervix and endometrial lining appeared normal both ostia were seen there was no deformity of the cavity. The Minerva device inserted and a seal test was done and was passed. The uterine cavity was ablated. Repeat hysteroscopy performed.  All instrument, needle, and lap counts were correct x 2. The patient was awakened taken to recovery room in stable condition.  Donnamae Jude MD 03/19/2018 2:03 PM

## 2018-03-19 NOTE — Anesthesia Procedure Notes (Signed)
Procedure Name: LMA Insertion Date/Time: 03/19/2018 1:15 PM Performed by: Marrianne Mood, CRNA Pre-anesthesia Checklist: Patient identified, Emergency Drugs available, Suction available, Patient being monitored and Timeout performed Patient Re-evaluated:Patient Re-evaluated prior to induction Oxygen Delivery Method: Circle system utilized Preoxygenation: Pre-oxygenation with 100% oxygen Induction Type: IV induction Ventilation: Mask ventilation without difficulty LMA: LMA inserted LMA Size: 4.0 Number of attempts: 1 Airway Equipment and Method: Bite block Placement Confirmation: positive ETCO2 Tube secured with: Tape Dental Injury: Teeth and Oropharynx as per pre-operative assessment

## 2018-03-19 NOTE — Anesthesia Preprocedure Evaluation (Signed)
Anesthesia Evaluation  Patient identified by MRN, date of birth, ID band Patient awake    History of Anesthesia Complications (+) PONV and history of anesthetic complications  Airway Mallampati: II       Dental  (+) Teeth Intact, Dental Advisory Given   Pulmonary Current Smoker, former smoker,    Pulmonary exam normal breath sounds clear to auscultation       Cardiovascular hypertension, Normal cardiovascular exam Rhythm:Regular Rate:Normal     Neuro/Psych  Headaches, PSYCHIATRIC DISORDERS Depression  Neuromuscular disease    GI/Hepatic negative GI ROS, Neg liver ROS,   Endo/Other  diabetes, Gestational  Renal/GU Renal diseaseHx/o glomerulonephritis   cystocele    Musculoskeletal  (+) Arthritis , Fibromyalgia -  Abdominal (+) + obese,   Peds  Hematology  (+) anemia ,   Anesthesia Other Findings   Reproductive/Obstetrics Desires sterilization                           Anesthesia Physical  Anesthesia Plan  ASA: II  Anesthesia Plan: General   Post-op Pain Management:    Induction: Intravenous  PONV Risk Score and Plan: 4 or greater and Ondansetron, Dexamethasone, Scopolamine patch - Pre-op and Midazolam  Airway Management Planned: LMA  Additional Equipment: None  Intra-op Plan:   Post-operative Plan: Extubation in OR  Informed Consent: I have reviewed the patients History and Physical, chart, labs and discussed the procedure including the risks, benefits and alternatives for the proposed anesthesia with the patient or authorized representative who has indicated his/her understanding and acceptance.   Dental advisory given  Plan Discussed with: Anesthesiologist, CRNA and Surgeon  Anesthesia Plan Comments:        Anesthesia Quick Evaluation

## 2018-03-19 NOTE — Transfer of Care (Signed)
Immediate Anesthesia Transfer of Care Note  Patient: [name]  Procedure(s) Performed: DILATION AND CURETTAGE /HYSTEROSCOPY WITH MINERVA ABLATION (N/A Vagina )  Patient Location: PACU  Anesthesia Type:General  Level of Consciousness: awake  Airway & Oxygen Therapy: Patient Spontanous Breathing and Patient connected to face mask oxygen  Post-op Assessment: Report given to RN and Post -op Vital signs reviewed and stable  Post vital signs: Reviewed and stable  Last Vitals:  Vitals Value Taken Time  BP    Temp    Pulse 103 03/19/2018  1:48 PM  Resp    SpO2 100 % 03/19/2018  1:48 PM  Vitals shown include unvalidated device data.  Last Pain:  Vitals:   03/19/18 1231  TempSrc: Oral  PainSc: 0-No pain         Complications: No apparent anesthesia complications

## 2018-03-19 NOTE — Discharge Instructions (Signed)
Hysteroscopy, Care After °Refer to this sheet in the next few weeks. These instructions provide you with information on caring for yourself after your procedure. Your health care provider may also give you more specific instructions. Your treatment has been planned according to current medical practices, but problems sometimes occur. Call your health care provider if you have any problems or questions after your procedure. °What can I expect after the procedure? °After your procedure, it is typical to have the following: °· You may have some cramping. This normally lasts for a couple days. °· You may have bleeding. This can vary from light spotting for a few days to menstrual-like bleeding for 3-7 days. ° °Follow these instructions at home: °· Rest for the first 1-2 days after the procedure. °· Only take over-the-counter or prescription medicines as directed by your health care provider. Do not take aspirin. It can increase the chances of bleeding. °· Take showers instead of baths for 2 weeks or as directed by your health care provider. °· Do not drive for 24 hours or as directed. °· Do not drink alcohol while taking pain medicine. °· Do not use tampons, douche, or have sexual intercourse for 2 weeks or until your health care provider says it is okay. °· Take your temperature twice a day for 4-5 days. Write it down each time. °· Follow your health care provider's advice about diet, exercise, and lifting. °· If you develop constipation, you may: °? Take a mild laxative if your health care provider approves. °? Add bran foods to your diet. °? Drink enough fluids to keep your urine clear or pale yellow. °· Try to have someone with you or available to you for the first 24-48 hours, especially if you were given a general anesthetic. °· Follow up with your health care provider as directed. °Contact a health care provider if: °· You feel dizzy or lightheaded. °· You feel sick to your stomach (nauseous). °· You have  abnormal vaginal discharge. °· You have a rash. °· You have pain that is not controlled with medicine. °Get help right away if: °· You have bleeding that is heavier than a normal menstrual period. °· You have a fever. °· You have increasing cramps or pain, not controlled with medicine. °· You have new belly (abdominal) pain. °· You pass out. °· You have pain in the tops of your shoulders (shoulder strap areas). °· You have shortness of breath. °This information is not intended to replace advice given to you by your health care provider. Make sure you discuss any questions you have with your health care provider. °Document Released: 04/08/2013 Document Revised: 11/24/2015 Document Reviewed: 01/15/2013 °Elsevier Interactive Patient Education © 2017 [employer] ° ° °Post Anesthesia Home Care Instructions ° °Activity: °Get plenty of rest for the remainder of the day. A responsible individual must stay with you for 24 hours following the procedure.  °For the next 24 hours, DO NOT: °-Drive a car °-Operate machinery °-Drink alcoholic beverages °-Take any medication unless instructed by your physician °-Make any legal decisions or sign important papers. ° °Meals: °Start with liquid foods such as gelatin or soup. Progress to regular foods as tolerated. Avoid greasy, spicy, heavy foods. If nausea and/or vomiting occur, drink only clear liquids until the nausea and/or vomiting subsides. Call your physician if vomiting continues. ° °Special Instructions/Symptoms: °Your throat may feel dry or sore from the anesthesia or the breathing tube placed in your throat during surgery. If this causes discomfort, gargle   with warm salt water. The discomfort should disappear within 24 hours. ° °If you had a scopolamine patch placed behind your ear for the management of post- operative nausea and/or vomiting: ° °1. The medication in the patch is effective for 72 hours, after which it should be removed.  Wrap patch in a tissue and discard in  the trash. Wash hands thoroughly with soap and water. °2. You may remove the patch earlier than 72 hours if you experience unpleasant side effects which may include dry mouth, dizziness or visual disturbances. °3. Avoid touching the patch. Wash your hands with soap and water after contact with the patch. °  ° ° °

## 2018-03-20 ENCOUNTER — Encounter (HOSPITAL_BASED_OUTPATIENT_CLINIC_OR_DEPARTMENT_OTHER): Payer: Self-pay | Admitting: Family Medicine

## 2018-03-21 NOTE — Anesthesia Postprocedure Evaluation (Signed)
Anesthesia Post Note  Patient: [name]  Procedure(s) Performed: DILATION AND CURETTAGE /HYSTEROSCOPY WITH MINERVA ABLATION (N/A Vagina )     Patient location during evaluation: PACU Anesthesia Type: General Level of consciousness: sedated and patient cooperative Pain management: pain level controlled Vital Signs Assessment: post-procedure vital signs reviewed and stable Respiratory status: spontaneous breathing Cardiovascular status: stable Anesthetic complications: no    Last Vitals:  Vitals:   03/19/18 1411 03/19/18 1422  BP: (!) 145/84 (!) 143/88  Pulse: 76 89  Resp: 12 16  Temp:  36.7 C  SpO2: 100% 99%    Last Pain:  Vitals:   03/19/18 1458  TempSrc:   PainSc: 4    Pain Goal:                 Nolon Nations

## 2018-03-24 DIAGNOSIS — G5603 Carpal tunnel syndrome, bilateral upper limbs: Secondary | ICD-10-CM | POA: Diagnosis not present

## 2018-03-24 DIAGNOSIS — R300 Dysuria: Secondary | ICD-10-CM | POA: Diagnosis not present

## 2018-03-24 DIAGNOSIS — B370 Candidal stomatitis: Secondary | ICD-10-CM | POA: Diagnosis not present

## 2018-03-25 ENCOUNTER — Encounter: Payer: Self-pay | Admitting: Radiology

## 2018-04-03 ENCOUNTER — Ambulatory Visit (INDEPENDENT_AMBULATORY_CARE_PROVIDER_SITE_OTHER): Payer: 59 | Admitting: Obstetrics & Gynecology

## 2018-04-03 ENCOUNTER — Encounter: Payer: Self-pay | Admitting: Obstetrics & Gynecology

## 2018-04-03 VITALS — BP 124/85 | HR 64 | Ht 64.0 in | Wt 183.6 lb

## 2018-04-03 DIAGNOSIS — Z9889 Other specified postprocedural states: Secondary | ICD-10-CM

## 2018-04-03 DIAGNOSIS — Z09 Encounter for follow-up examination after completed treatment for conditions other than malignant neoplasm: Secondary | ICD-10-CM

## 2018-04-03 NOTE — Patient Instructions (Signed)
Return to clinic for any scheduled appointments or for any gynecologic concerns as needed.   

## 2018-04-03 NOTE — Progress Notes (Signed)
Subjective:     [name] is a 37 y.o. [id number] female who presents to the clinic 2 weeks status post Minerva endometrial ablation for abnormal uterine bleeding. Eating a regular diet without difficulty. Bowel movements are normal. The patient is not having any pain. She is just having the expected light pink discharge, no other concerns.   The following portions of the patient's history were reviewed and updated as appropriate: allergies, current medications, past family history, past medical history, past social history, past surgical history and problem list.  Normal pap and negative HRHPV on 05/30/2017.   Review of Systems Pertinent items noted in HPI and remainder of comprehensive ROS otherwise negative.    Objective:    BP 124/85 (BP Location: Left Arm)   Pulse 64   Ht 5\' 4"  (1.626 m)   Wt 183 lb 9.6 oz (83.3 kg)   BMI 31.51 kg/m  General:  alert and no distress  Abdomen: soft, bowel sounds active, non-tender  Pelvic:   light pink discharge noted     Assessment:    Doing well postoperatively. Operative findings again reviewed.   Plan:   1. Continue any current medications. 2. Activity restrictions: none 4. Anticipated return to work: not applicable. 5. Follow up as needed.  Verita Schneiders, MD, [place] for [employer], [place]

## 2018-04-07 DIAGNOSIS — M059 Rheumatoid arthritis with rheumatoid factor, unspecified: Secondary | ICD-10-CM | POA: Diagnosis not present

## 2018-04-08 DIAGNOSIS — M059 Rheumatoid arthritis with rheumatoid factor, unspecified: Secondary | ICD-10-CM | POA: Diagnosis not present

## 2018-04-08 DIAGNOSIS — I10 Essential (primary) hypertension: Secondary | ICD-10-CM | POA: Diagnosis not present

## 2018-04-08 DIAGNOSIS — Z23 Encounter for immunization: Secondary | ICD-10-CM | POA: Diagnosis not present

## 2018-04-15 ENCOUNTER — Other Ambulatory Visit: Payer: Self-pay

## 2018-04-15 ENCOUNTER — Encounter: Payer: Self-pay | Admitting: *Deleted

## 2018-04-16 ENCOUNTER — Encounter: Payer: Self-pay | Admitting: Anesthesiology

## 2018-04-22 DIAGNOSIS — M059 Rheumatoid arthritis with rheumatoid factor, unspecified: Secondary | ICD-10-CM | POA: Diagnosis not present

## 2018-04-22 DIAGNOSIS — Z79899 Other long term (current) drug therapy: Secondary | ICD-10-CM | POA: Diagnosis not present

## 2018-04-23 ENCOUNTER — Ambulatory Visit: Payer: 59 | Admitting: Anesthesiology

## 2018-04-23 ENCOUNTER — Ambulatory Visit
Admission: RE | Admit: 2018-04-23 | Discharge: 2018-04-23 | Disposition: A | Discharge status: Home / Self Care | Payer: 59 | Source: Ambulatory Visit | Attending: Surgery | Admitting: Surgery

## 2018-04-23 ENCOUNTER — Encounter
Admission: RE | Disposition: A | Discharge status: Home / Self Care | Payer: Self-pay | Source: Ambulatory Visit | Attending: Surgery

## 2018-04-23 DIAGNOSIS — F329 Major depressive disorder, single episode, unspecified: Secondary | ICD-10-CM | POA: Insufficient documentation

## 2018-04-23 DIAGNOSIS — Z7951 Long term (current) use of inhaled steroids: Secondary | ICD-10-CM | POA: Diagnosis not present

## 2018-04-23 DIAGNOSIS — I10 Essential (primary) hypertension: Secondary | ICD-10-CM | POA: Insufficient documentation

## 2018-04-23 DIAGNOSIS — Z87891 Personal history of nicotine dependence: Secondary | ICD-10-CM | POA: Insufficient documentation

## 2018-04-23 DIAGNOSIS — Z79899 Other long term (current) drug therapy: Secondary | ICD-10-CM | POA: Insufficient documentation

## 2018-04-23 DIAGNOSIS — Z79891 Long term (current) use of opiate analgesic: Secondary | ICD-10-CM | POA: Insufficient documentation

## 2018-04-23 DIAGNOSIS — F419 Anxiety disorder, unspecified: Secondary | ICD-10-CM | POA: Insufficient documentation

## 2018-04-23 DIAGNOSIS — G5601 Carpal tunnel syndrome, right upper limb: Secondary | ICD-10-CM | POA: Diagnosis not present

## 2018-04-23 DIAGNOSIS — M25531 Pain in right wrist: Secondary | ICD-10-CM | POA: Diagnosis present

## 2018-04-23 HISTORY — DX: Carpal tunnel syndrome, bilateral upper limbs: G56.03

## 2018-04-23 HISTORY — PX: CARPAL TUNNEL RELEASE: SHX101

## 2018-04-23 HISTORY — DX: Unspecified nephritic syndrome with unspecified morphologic changes: N05.9

## 2018-04-23 SURGERY — CARPAL TUNNEL RELEASE
Anesthesia: Regional | Site: Wrist | Laterality: Right

## 2018-04-23 SURGERY — RELEASE, CARPAL TUNNEL, ENDOSCOPIC
Anesthesia: Choice | Laterality: Right

## 2018-04-23 MED ORDER — POTASSIUM CHLORIDE IN NACL 20-0.9 MEQ/L-% IV SOLN: INTRAVENOUS | Status: DC

## 2018-04-23 MED ORDER — HYDROCODONE-ACETAMINOPHEN 5-325 MG PO TABS: 1.0000 | ORAL_TABLET | ORAL | Status: DC | PRN

## 2018-04-23 MED ORDER — METOCLOPRAMIDE HCL 5 MG PO TABS: 5.0000 mg | ORAL_TABLET | Freq: Three times a day (TID) | ORAL | Status: DC | PRN

## 2018-04-23 MED ORDER — OXYCODONE HCL 5 MG/5ML PO SOLN: 5.0000 mg | Freq: Once | ORAL | Status: AC | PRN

## 2018-04-23 MED ORDER — LACTATED RINGERS IV SOLN
1000.0000 mL | INTRAVENOUS | Status: DC
  Administered 2018-04-23: 1000 mL via INTRAVENOUS

## 2018-04-23 MED ORDER — LIDOCAINE HCL (PF) 0.5 % IJ SOLN
INTRAMUSCULAR | Status: DC | PRN
  Administered 2018-04-23: 200 mg via INTRAVENOUS

## 2018-04-23 MED ORDER — METOCLOPRAMIDE HCL 5 MG/ML IJ SOLN: 5.0000 mg | Freq: Three times a day (TID) | INTRAMUSCULAR | Status: DC | PRN

## 2018-04-23 MED ORDER — TRAMADOL HCL 50 MG PO TABS: 50.0000 mg | ORAL_TABLET | Freq: Four times a day (QID) | ORAL | 0 refills | Status: DC | PRN

## 2018-04-23 MED ORDER — FENTANYL CITRATE (PF) 100 MCG/2ML IJ SOLN
INTRAMUSCULAR | Status: DC | PRN
  Administered 2018-04-23 (×2): 50 ug via INTRAVENOUS

## 2018-04-23 MED ORDER — FENTANYL CITRATE (PF) 100 MCG/2ML IJ SOLN: 25.0000 ug | INTRAMUSCULAR | Status: DC | PRN

## 2018-04-23 MED ORDER — ONDANSETRON HCL 4 MG PO TABS: 4.0000 mg | ORAL_TABLET | Freq: Four times a day (QID) | ORAL | Status: DC | PRN

## 2018-04-23 MED ORDER — ONDANSETRON HCL 4 MG/2ML IJ SOLN
INTRAMUSCULAR | Status: DC | PRN
  Administered 2018-04-23: 4 mg via INTRAVENOUS

## 2018-04-23 MED ORDER — ONDANSETRON HCL 4 MG/2ML IJ SOLN: 4.0000 mg | Freq: Four times a day (QID) | INTRAMUSCULAR | Status: DC | PRN

## 2018-04-23 MED ORDER — MIDAZOLAM HCL 5 MG/5ML IJ SOLN
INTRAMUSCULAR | Status: DC | PRN
  Administered 2018-04-23: 2 mg via INTRAVENOUS

## 2018-04-23 MED ORDER — PROPOFOL 500 MG/50ML IV EMUL
INTRAVENOUS | Status: DC | PRN
  Administered 2018-04-23: 75 ug/kg/min via INTRAVENOUS

## 2018-04-23 MED ORDER — HYDROCODONE-ACETAMINOPHEN 5-325 MG PO TABS: 1.0000 | ORAL_TABLET | Freq: Four times a day (QID) | ORAL | 0 refills | Status: DC | PRN

## 2018-04-23 MED ORDER — CEFAZOLIN SODIUM-DEXTROSE 2-4 GM/100ML-% IV SOLN
2.0000 g | Freq: Three times a day (TID) | INTRAVENOUS | Status: DC
  Administered 2018-04-23: 2 g via INTRAVENOUS

## 2018-04-23 MED ORDER — OXYCODONE HCL 5 MG PO TABS
5.0000 mg | ORAL_TABLET | Freq: Once | ORAL | Status: AC | PRN
  Administered 2018-04-23: 5 mg via ORAL

## 2018-04-23 MED ORDER — BUPIVACAINE HCL (PF) 0.5 % IJ SOLN
INTRAMUSCULAR | Status: DC | PRN
  Administered 2018-04-23: 10 mL

## 2018-04-23 MED ORDER — IBUPROFEN 600 MG PO TABS
600.0000 mg | ORAL_TABLET | Freq: Once | ORAL | Status: AC
  Administered 2018-04-23: 600 mg via ORAL

## 2018-04-23 SURGICAL SUPPLY — 25 items
BANDAGE ELASTIC 2 LF NS (GAUZE/BANDAGES/DRESSINGS) ×2 IMPLANT
BNDG COHESIVE 4X5 TAN STRL (GAUZE/BANDAGES/DRESSINGS) ×2 IMPLANT
BNDG ESMARK 4X12 TAN STRL LF (GAUZE/BANDAGES/DRESSINGS) ×2 IMPLANT
CHLORAPREP W/TINT 26ML (MISCELLANEOUS) ×2 IMPLANT
CORD BIP STRL DISP 12FT (MISCELLANEOUS) ×2 IMPLANT
COVER LIGHT HANDLE UNIVERSAL (MISCELLANEOUS) ×4 IMPLANT
CUFF TOURNIQUET DUAL PORT 18X3 (MISCELLANEOUS) ×2 IMPLANT
DRAPE SURG 17X11 SM STRL (DRAPES) ×2 IMPLANT
GAUZE PETRO XEROFOAM 1X8 (MISCELLANEOUS) ×2 IMPLANT
GAUZE SPONGE 4X4 12PLY STRL (GAUZE/BANDAGES/DRESSINGS) ×2 IMPLANT
GLOVE BIO SURGEON STRL SZ8 (GLOVE) ×2 IMPLANT
GLOVE INDICATOR 8.0 STRL GRN (GLOVE) ×2 IMPLANT
GOWN STRL REUS W/ TWL LRG LVL3 (GOWN DISPOSABLE) ×1 IMPLANT
GOWN STRL REUS W/ TWL XL LVL3 (GOWN DISPOSABLE) ×1 IMPLANT
GOWN STRL REUS W/TWL LRG LVL3 (GOWN DISPOSABLE) ×1
GOWN STRL REUS W/TWL XL LVL3 (GOWN DISPOSABLE) ×1
KIT CARPAL TUNNEL (MISCELLANEOUS) ×1
KIT ESCP INSRT D SLOT CANN KN (MISCELLANEOUS) ×1 IMPLANT
KIT TURNOVER KIT A (KITS) ×2 IMPLANT
NS IRRIG 500ML POUR BTL (IV SOLUTION) ×2 IMPLANT
PACK EXTREMITY ARMC (MISCELLANEOUS) ×2 IMPLANT
SPLINT WRIST LG RT TX900304 (SOFTGOODS) ×1 IMPLANT
STOCKINETTE IMPERVIOUS 9X36 MD (GAUZE/BANDAGES/DRESSINGS) ×2 IMPLANT
STRAP BODY AND KNEE 60X3 (MISCELLANEOUS) ×2 IMPLANT
SUT PROLENE 4 0 PS 2 18 (SUTURE) ×2 IMPLANT

## 2018-04-23 NOTE — Transfer of Care (Signed)
Immediate Anesthesia Transfer of Care Note  Patient: [name]  Procedure(s) Performed: ENDOSCOPIC RIGHT CARPAL TUNNEL RELEASE (Right Wrist)  Patient Location: PACU  Anesthesia Type: General, Bier Block  Level of Consciousness: awake, alert  and patient cooperative  Airway and Oxygen Therapy: Patient Spontanous Breathing and Patient connected to supplemental oxygen  Post-op Assessment: Post-op Vital signs reviewed, Patient's Cardiovascular Status Stable, Respiratory Function Stable, Patent Airway and No signs of Nausea or vomiting  Post-op Vital Signs: Reviewed and stable  Complications: No apparent anesthesia complications

## 2018-04-23 NOTE — H&P (Signed)
Paper H&P to be scanned into permanent record. H&P reviewed and patient re-examined. No changes. 

## 2018-04-23 NOTE — Anesthesia Preprocedure Evaluation (Signed)
Anesthesia Evaluation  Patient identified by MRN, date of birth, ID band Patient awake    Reviewed: Allergy & Precautions, NPO status , Patient's Chart, lab work & pertinent test results, reviewed documented beta blocker date and time   Airway Mallampati: I  TM Distance: >3 FB Neck ROM: Full    Dental no notable dental hx.    Pulmonary former smoker,    Pulmonary exam normal breath sounds clear to auscultation       Cardiovascular hypertension, Normal cardiovascular exam Rhythm:Regular Rate:Normal     Neuro/Psych Anxiety  Neuromuscular disease    GI/Hepatic negative GI ROS, Neg liver ROS,   Endo/Other    Renal/GU Renal disease     Musculoskeletal  (+) Arthritis  (RA), Fibromyalgia -  Abdominal (+) + obese,   Peds  Hematology negative hematology ROS (+)   Anesthesia Other Findings   Reproductive/Obstetrics                             Anesthesia Physical Anesthesia Plan  ASA: II  Anesthesia Plan: General and Bier Block and Bier Block-LIDOCAINE ONLY   Post-op Pain Management:    Induction: Intravenous  PONV Risk Score and Plan:   Airway Management Planned: Natural Airway  Additional Equipment:   Intra-op Plan:   Post-operative Plan:   Informed Consent: I have reviewed the patients History and Physical, chart, labs and discussed the procedure including the risks, benefits and alternatives for the proposed anesthesia with the patient or authorized representative who has indicated his/her understanding and acceptance.     Plan Discussed with: CRNA, Anesthesiologist and Surgeon  Anesthesia Plan Comments:         Anesthesia Quick Evaluation

## 2018-04-23 NOTE — Anesthesia Postprocedure Evaluation (Signed)
Anesthesia Post Note  Patient: [name]  Procedure(s) Performed: ENDOSCOPIC RIGHT CARPAL TUNNEL RELEASE (Right Wrist)  Patient location during evaluation: PACU Anesthesia Type: Bier Block Level of consciousness: awake Pain management: pain level controlled Vital Signs Assessment: post-procedure vital signs reviewed and stable Respiratory status: spontaneous breathing Cardiovascular status: blood pressure returned to baseline Postop Assessment: no headache Anesthetic complications: no    Lavonna Monarch

## 2018-04-23 NOTE — Op Note (Signed)
04/23/2018  12:37 PM  Patient:   [name] [id number]  Pre-Op Diagnosis:   Right carpal tunnel syndrome.  Post-Op Diagnosis:   Same.  Procedure:   Endoscopic right carpal tunnel release.  Surgeon:   Pascal Lux, MD  Anesthesia:   Bier block  Findings:   As above.  Complications:   None  EBL:   0 cc  Fluids:   800 cc crystalloid  TT:   26 minutes at 250 mmHg  Drains:   None  Closure:   4-0 Prolene interrupted sutures  Brief Clinical Note:   The patient is a 37 year old female with a long history of gradually worsening pain and paresthesias to her right hand. Her symptoms have progressed despite medications, activity modification, splinting, etc. Her history and examination are consistent with carpal tunnel syndrome confirmed by EMG. The patient presents at this time for an endoscopic right carpal tunnel release.   Procedure:   The patient was brought into the operating room and lain in the supine position. After adequate IV sedation was achieved, a timeout was performed to verify the appropriate surgical site before a Bier block was placed by the anesthesiologist and the tourniquet inflated to 250 mmHg. The right hand and upper extremity were prepped with ChloraPrep solution before being draped sterilely. Preoperative antibiotics were administered. An approximately 1.5-2 cm incision was made over the volar wrist flexion crease, centered over the palmaris longus tendon. The incision was carried down through the subcutaneous tissues with care taken to identify and protect any neurovascular structures. The distal forearm fascia was penetrated just proximal to the transverse carpal ligament. The soft tissues were released off the superficial and deep surfaces of the distal forearm fascia and this was released proximally for 3-4 cm under direct visualization.  Attention was directed distally. The Soil scientist was passed beneath the transverse carpal ligament along the ulnar aspect of  the carpal tunnel and used to release any adhesions as well as to remove any adherent synovial tissue before first the smaller then the larger of the two dilators were passed beneath the transverse carpal ligament along the ulnar margin of the carpal tunnel. The slotted cannula was introduced and the endoscope was placed into the slotted cannula and the undersurface of the transverse carpal ligament visualized. The distal margin of the transverse carpal ligament was marked by placing a 25-gauge needle percutaneously at [place] cardinal point so that it entered the distal portion of the slotted cannula. Under endoscopic visualization, the transverse carpal ligament was released from proximal to distal using the end-cutting blade. A second pass was performed to ensure complete release of the ligament. The adequacy of release was verified both endoscopically and by palpation using the freer elevator.  The wound was irrigated thoroughly with sterile saline solution before being closed using 4-0 Prolene interrupted sutures. A total of 10 cc of 0.5% plain Sensorcaine was injected in and around the incision before a sterile bulky dressing was applied to the wound. The patient was placed into a volar wrist splint before being awakened and returned to the recovery room in satisfactory condition after tolerating the procedure well.

## 2018-04-23 NOTE — Discharge Instructions (Signed)
General Anesthesia, Adult, Care After These instructions provide you with information about caring for yourself after your procedure. Your health care provider may also give you more specific instructions. Your treatment has been planned according to current medical practices, but problems sometimes occur. Call your health care provider if you have any problems or questions after your procedure. What can I expect after the procedure? After the procedure, it is common to have:  Vomiting.  A sore throat.  Mental slowness.  It is common to feel:  Nauseous.  Cold or shivery.  Sleepy.  Tired.  Sore or achy, even in parts of your body where you did not have surgery.  Follow these instructions at home: For at least 24 hours after the procedure:  Do not: ? Participate in activities where you could fall or become injured. ? Drive. ? Use heavy machinery. ? Drink alcohol. ? Take sleeping pills or medicines that cause drowsiness. ? Make important decisions or sign legal documents. ? Take care of children on your own.  Rest. Eating and drinking  If you vomit, drink water, juice, or soup when you can drink without vomiting.  Drink enough fluid to keep your urine clear or pale yellow.  Make sure you have little or no nausea before eating solid foods.  Follow the diet recommended by your health care provider. General instructions  Have a responsible adult stay with you until you are awake and alert.  Return to your normal activities as told by your health care provider. Ask your health care provider what activities are safe for you.  Take over-the-counter and prescription medicines only as told by your health care provider.  If you smoke, do not smoke without supervision.  Keep all follow-up visits as told by your health care provider. This is important. Contact a health care provider if:  You continue to have nausea or vomiting at home, and medicines are not helpful.  You  cannot drink fluids or start eating again.  You cannot urinate after 8-12 hours.  You develop a skin rash.  You have fever.  You have increasing redness at the site of your procedure. Get help right away if:  You have difficulty breathing.  You have chest pain.  You have unexpected bleeding.  You feel that you are having a life-threatening or urgent problem. This information is not intended to replace advice given to you by your health care provider. Make sure you discuss any questions you have with your health care provider. Document Released: 09/24/2000 Document Revised: 11/21/2015 Document Reviewed: 06/02/2015 Elsevier Interactive Patient Education  2018 [place] discharge instructions: Keep dressing dry and intact. Keep hand elevated above heart level. May shower after dressing removed on postop day 4 (Sunday). Cover sutures with Band-Aids after drying off. Apply ice to affected area frequently. Take ibuprofen 600-800 mg TID with meals for 7-10 days, then as necessary. Take ES Tylenol or pain medication as prescribed when needed.  Return for follow-up in 10-14 days or as scheduled.

## 2018-04-23 NOTE — Anesthesia Procedure Notes (Signed)
Procedure Name: [place] Performed by: Cameron Ali, CRNA Pre-anesthesia Checklist: Patient identified, Emergency Drugs available, Suction available, Patient being monitored and Timeout performed Patient Re-evaluated:Patient Re-evaluated prior to induction Oxygen Delivery Method: Simple face mask Placement Confirmation: positive ETCO2 and breath sounds checked- equal and bilateral

## 2018-04-29 DIAGNOSIS — M059 Rheumatoid arthritis with rheumatoid factor, unspecified: Secondary | ICD-10-CM | POA: Diagnosis not present

## 2018-04-29 DIAGNOSIS — Z79899 Other long term (current) drug therapy: Secondary | ICD-10-CM | POA: Diagnosis not present

## 2018-04-29 DIAGNOSIS — G5603 Carpal tunnel syndrome, bilateral upper limbs: Secondary | ICD-10-CM | POA: Diagnosis not present

## 2018-05-13 ENCOUNTER — Encounter: Payer: Self-pay | Admitting: Radiology

## 2018-05-19 DIAGNOSIS — R682 Dry mouth, unspecified: Secondary | ICD-10-CM | POA: Diagnosis not present

## 2018-05-19 DIAGNOSIS — E559 Vitamin D deficiency, unspecified: Secondary | ICD-10-CM | POA: Diagnosis not present

## 2018-05-19 DIAGNOSIS — M059 Rheumatoid arthritis with rheumatoid factor, unspecified: Secondary | ICD-10-CM | POA: Diagnosis not present

## 2018-06-09 DIAGNOSIS — E611 Iron deficiency: Secondary | ICD-10-CM | POA: Diagnosis not present

## 2018-06-09 DIAGNOSIS — G629 Polyneuropathy, unspecified: Secondary | ICD-10-CM | POA: Diagnosis not present

## 2018-06-09 DIAGNOSIS — E538 Deficiency of other specified B group vitamins: Secondary | ICD-10-CM | POA: Diagnosis not present

## 2018-06-30 DIAGNOSIS — M059 Rheumatoid arthritis with rheumatoid factor, unspecified: Secondary | ICD-10-CM | POA: Diagnosis not present

## 2018-08-01 DIAGNOSIS — M059 Rheumatoid arthritis with rheumatoid factor, unspecified: Secondary | ICD-10-CM | POA: Diagnosis not present

## 2018-08-01 DIAGNOSIS — M546 Pain in thoracic spine: Secondary | ICD-10-CM | POA: Diagnosis not present

## 2018-08-01 DIAGNOSIS — M549 Dorsalgia, unspecified: Secondary | ICD-10-CM | POA: Diagnosis not present

## 2018-08-01 DIAGNOSIS — M5489 Other dorsalgia: Secondary | ICD-10-CM | POA: Diagnosis not present

## 2018-08-03 ENCOUNTER — Emergency Department: Payer: 59

## 2018-08-03 ENCOUNTER — Other Ambulatory Visit: Payer: Self-pay

## 2018-08-03 ENCOUNTER — Emergency Department
Admission: EM | Admit: 2018-08-03 | Discharge: 2018-08-04 | Disposition: A | Discharge status: Home / Self Care | Payer: 59 | Attending: Emergency Medicine | Admitting: Emergency Medicine

## 2018-08-03 ENCOUNTER — Encounter: Payer: Self-pay | Admitting: Intensive Care

## 2018-08-03 DIAGNOSIS — M059 Rheumatoid arthritis with rheumatoid factor, unspecified: Secondary | ICD-10-CM | POA: Diagnosis not present

## 2018-08-03 DIAGNOSIS — I10 Essential (primary) hypertension: Secondary | ICD-10-CM | POA: Insufficient documentation

## 2018-08-03 DIAGNOSIS — R109 Unspecified abdominal pain: Secondary | ICD-10-CM

## 2018-08-03 DIAGNOSIS — R1011 Right upper quadrant pain: Secondary | ICD-10-CM | POA: Insufficient documentation

## 2018-08-03 DIAGNOSIS — R1013 Epigastric pain: Secondary | ICD-10-CM | POA: Diagnosis present

## 2018-08-03 DIAGNOSIS — Z87891 Personal history of nicotine dependence: Secondary | ICD-10-CM | POA: Diagnosis not present

## 2018-08-03 DIAGNOSIS — Z79899 Other long term (current) drug therapy: Secondary | ICD-10-CM | POA: Insufficient documentation

## 2018-08-03 DIAGNOSIS — N200 Calculus of kidney: Secondary | ICD-10-CM | POA: Diagnosis not present

## 2018-08-03 LAB — COMPREHENSIVE METABOLIC PANEL
ALBUMIN: 4.6 g/dL (ref 3.5–5.0)
ALK PHOS: 77 U/L (ref 38–126)
ALT: 135 U/L — ABNORMAL HIGH (ref 0–44)
ANION GAP: 8 (ref 5–15)
AST: 88 U/L — ABNORMAL HIGH (ref 15–41)
BILIRUBIN TOTAL: 0.9 mg/dL (ref 0.3–1.2)
BUN: 22 mg/dL — ABNORMAL HIGH (ref 6–20)
CO2: 25 mmol/L (ref 22–32)
CREATININE: 0.66 mg/dL (ref 0.44–1.00)
Calcium: 9 mg/dL (ref 8.9–10.3)
Chloride: 103 mmol/L (ref 98–111)
GFR calc Af Amer: >60 mL/min (ref >60)
GFR calc non Af Amer: >60 mL/min (ref >60)
GLUCOSE: 131 mg/dL — AB (ref 70–99)
Potassium: 3 mmol/L — ABNORMAL LOW (ref 3.5–5.1)
Sodium: 136 mmol/L (ref 135–145)
Total Protein: 7.7 g/dL (ref 6.5–8.1)

## 2018-08-03 LAB — CBC
HEMATOCRIT: 37.5 % (ref 36.0–46.0)
HEMOGLOBIN: 13.3 g/dL (ref 12.0–15.0)
MCH: 30.4 pg (ref 26.0–34.0)
MCHC: 35.5 g/dL (ref 30.0–36.0)
MCV: 85.8 fL (ref 80.0–100.0)
Platelets: 238 10*3/uL (ref 150–400)
RBC: 4.37 MIL/uL (ref 3.87–5.11)
RDW: 13.2 % (ref 11.5–15.5)
WBC: 8.7 10*3/uL (ref 4.0–10.5)
nRBC: 0 % (ref 0.0–0.2)

## 2018-08-03 LAB — URINALYSIS, COMPLETE (UACMP) WITH MICROSCOPIC
Bacteria, UA: NONE SEEN
Bilirubin Urine: NEGATIVE
Glucose, UA: NEGATIVE mg/dL
Hgb urine dipstick: NEGATIVE
Ketones, ur: NEGATIVE mg/dL
Leukocytes, UA: NEGATIVE
NITRITE: NEGATIVE
PH: 6 (ref 5.0–8.0)
Protein, ur: NEGATIVE mg/dL
SPECIFIC GRAVITY, URINE: 1.016 (ref 1.005–1.030)

## 2018-08-03 LAB — LIPASE, BLOOD: Lipase: 56 U/L — ABNORMAL HIGH (ref 11–51)

## 2018-08-03 LAB — POCT PREGNANCY, URINE: PREG TEST UR: NEGATIVE

## 2018-08-03 IMAGING — US US ABDOMEN LIMITED
1 series · 14 of 25 positions shown · non-contrast
Comparison: None.

CLINICAL DATA: Right upper quadrant pain.

EXAM:
ULTRASOUND ABDOMEN LIMITED RIGHT UPPER QUADRANT

[Series 1: us abdomen limited · 14 of 34 slices shown]
[im 1/34]
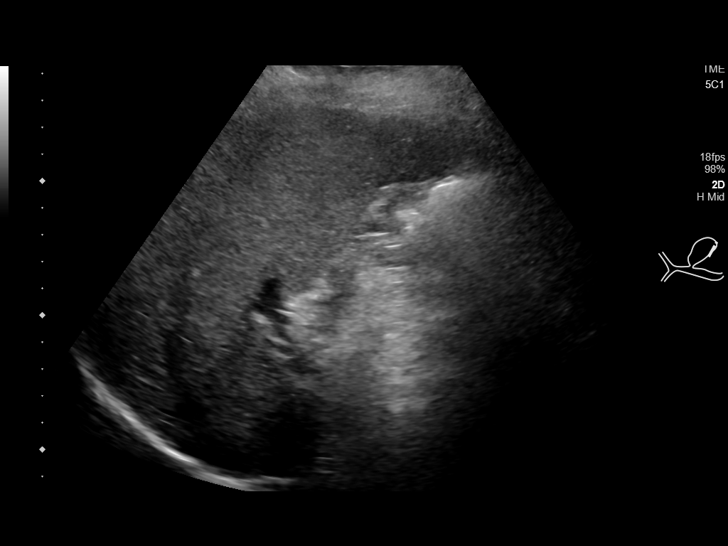
[im 3/34]
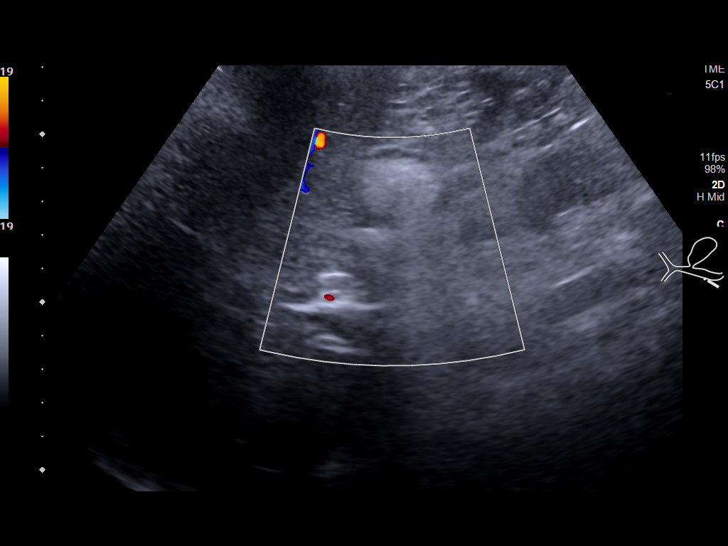
[im 6/34]
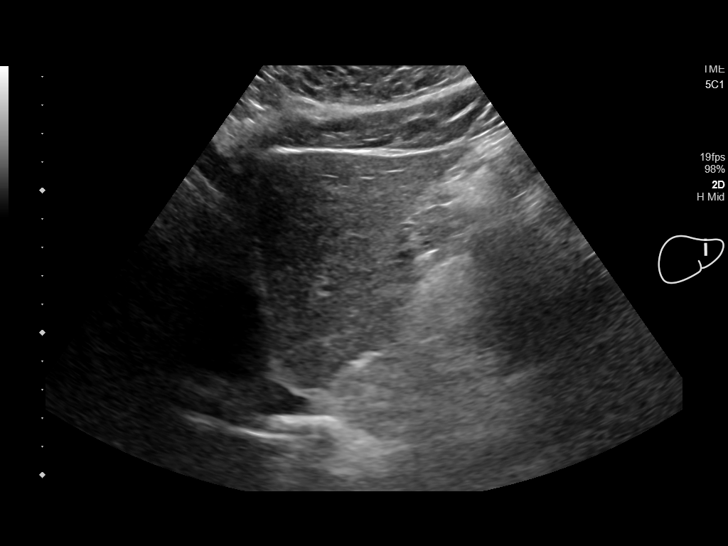
[im 9/34]
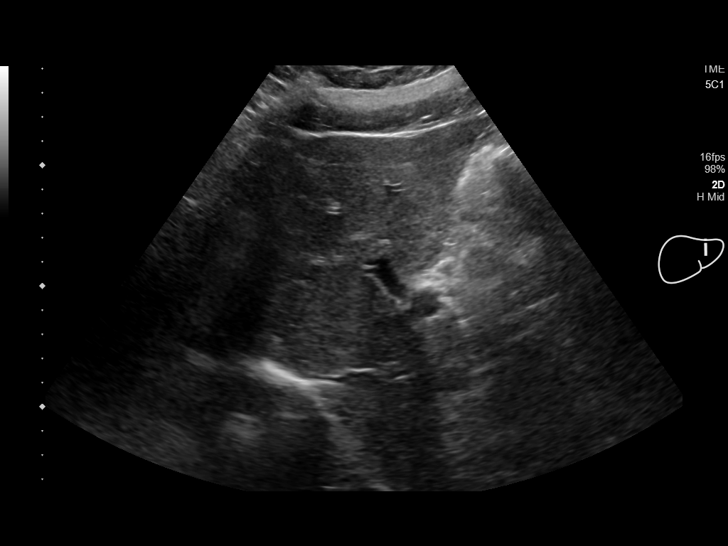
[im 12/34]
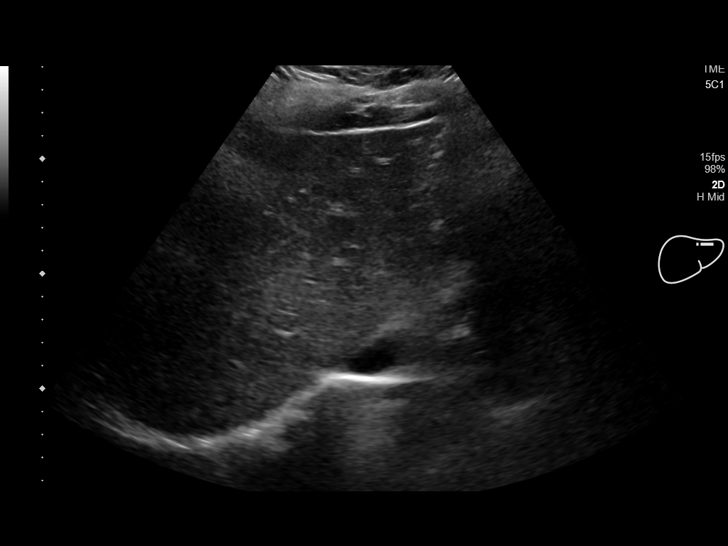
[im 13/34]
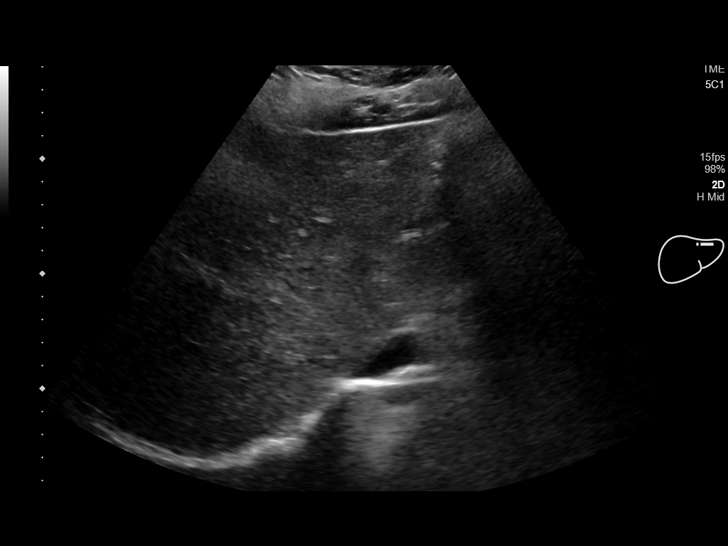
[im 16/34]
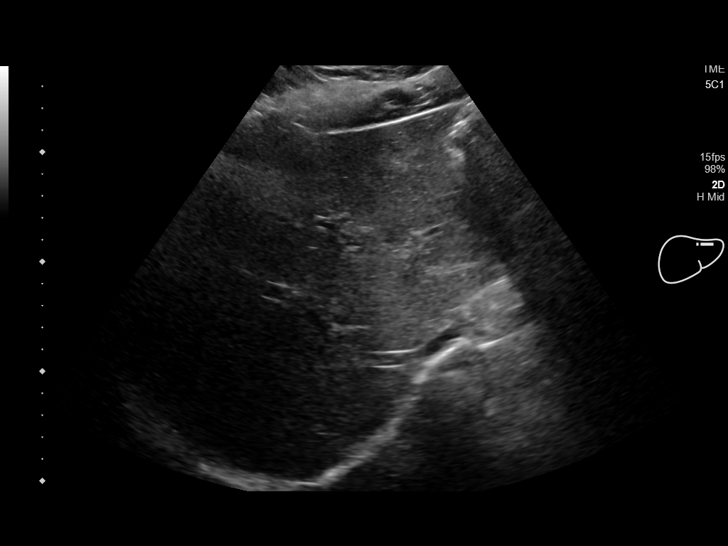
[im 18/34]
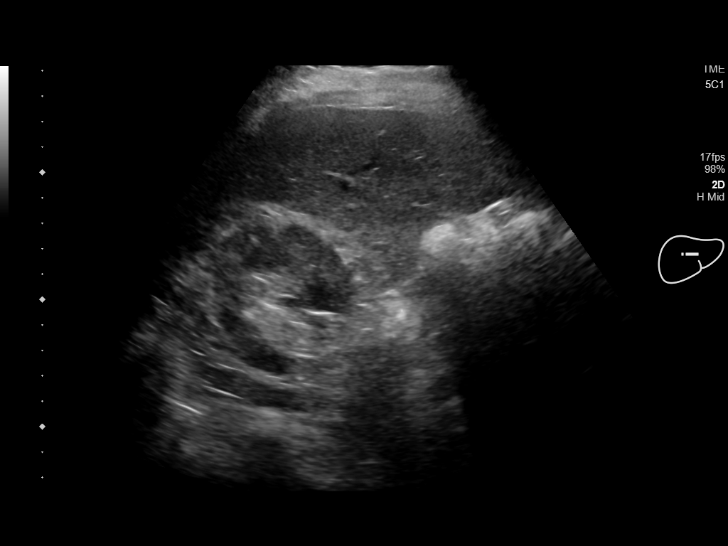
[im 21/34]
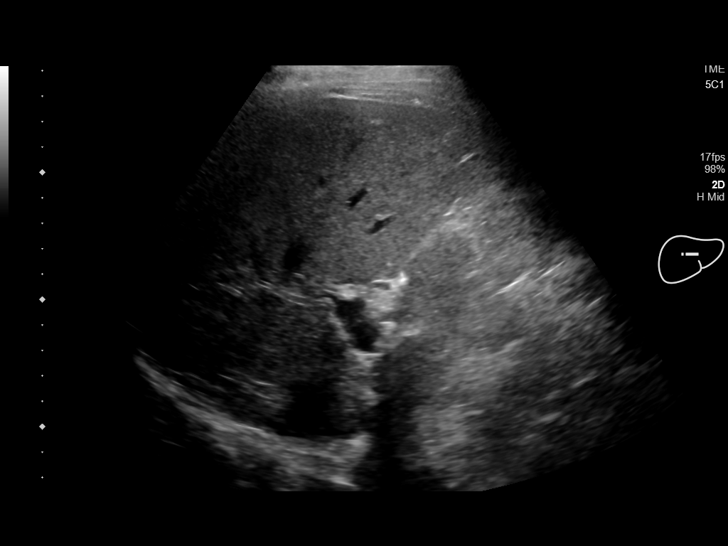
[im 23/34]
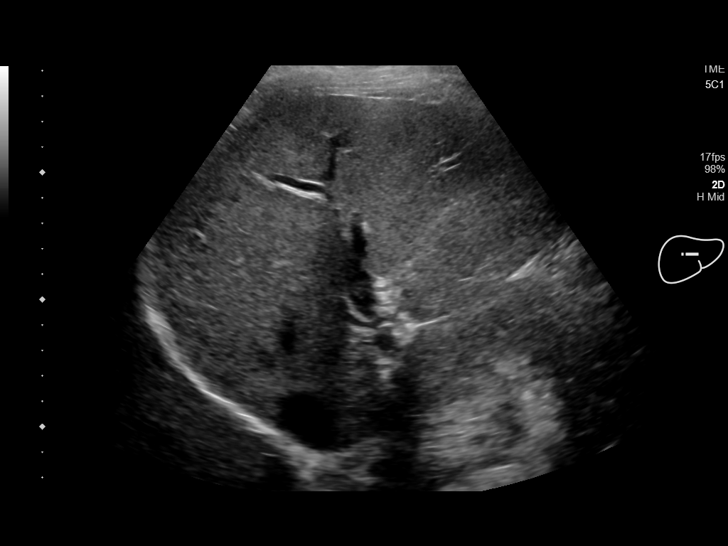
[im 25/34]
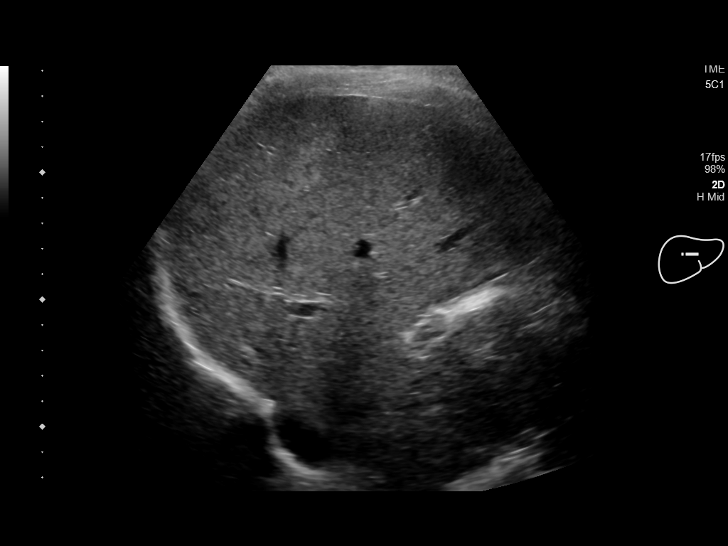
[im 28/34]
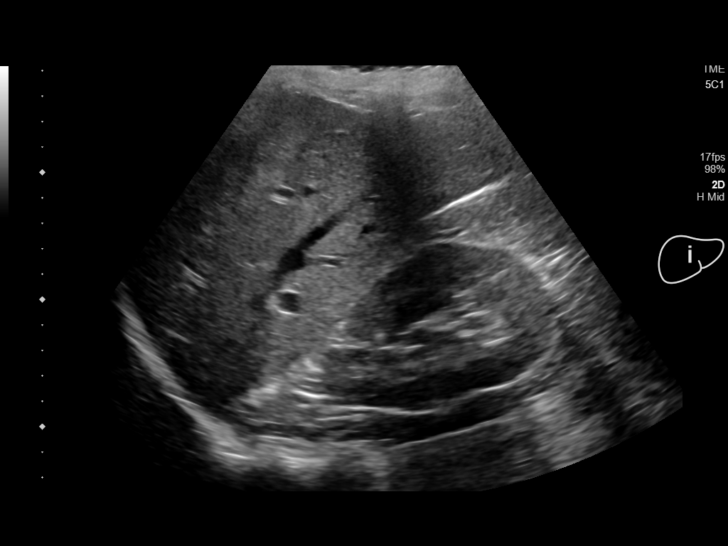
[im 31/34]
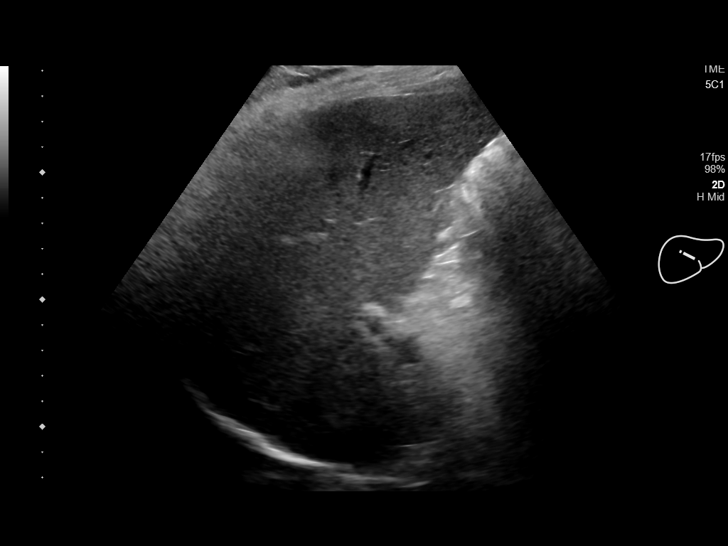
[im 34/34]
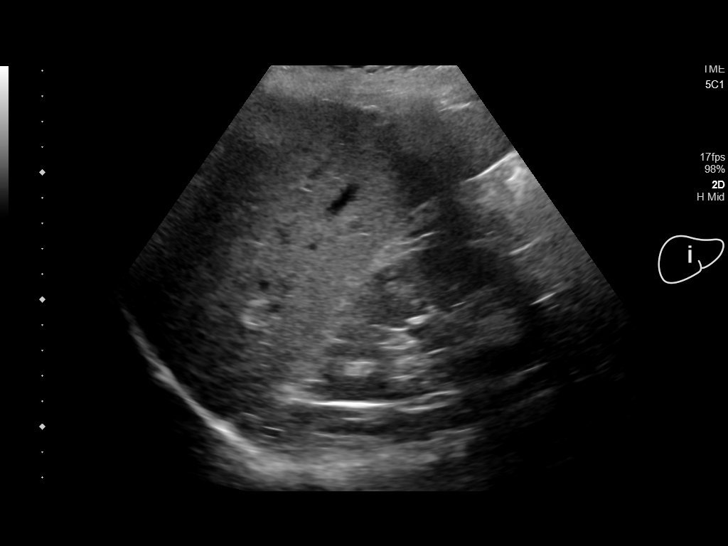

[14 of 25 positions shown; findings below may reference images not displayed]

FINDINGS: Gallbladder:

Prior cholecystectomy.

Common bile duct:

Diameter: 4.3 mm

Liver:

No focal lesion identified. Within normal limits in parenchymal
echogenicity. No intrahepatic or extrahepatic biliary ductal
dilatation. Portal vein is patent on color Doppler imaging with
normal direction of blood flow towards the liver.
IMPRESSION: 1. Prior cholecystectomy.
2. Otherwise normal right upper quadrant ultrasound.

## 2018-08-03 IMAGING — CT CT ABD-PELV W/ CM
2 of 4 series · 16 of 46 positions shown, 18 images · IV contrast (APPLIED)
Comparison: Prior ultrasounds

CLINICAL DATA: 37-year-old female with acute abdominal and pelvic
pain and nausea for 1 day.

EXAM:
CT ABDOMEN AND PELVIS WITH CONTRAST
TECHNIQUE: Multidetector CT imaging of the abdomen and pelvis was performed
using the standard protocol following bolus administration of
intravenous contrast.
CONTRAST:  100mL [88] IOPAMIDOL ([88]) INJECTION 61%

[Series 2: routine abd/pel with · axial · 0.73mm/px · z∈[-957,-527]mm · 13 of 96 slices shown, 15 images]
[im 5/96  soft-tissue]
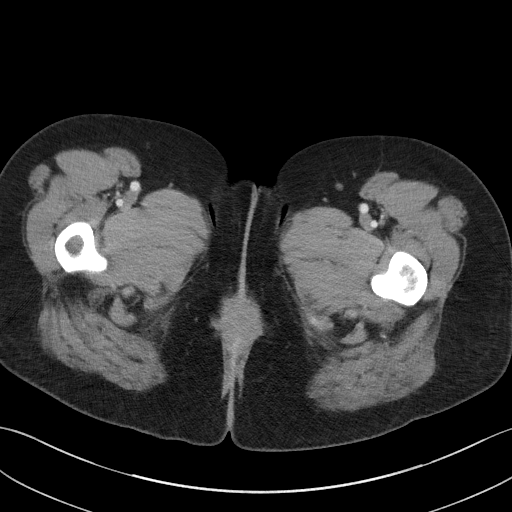
[im 5/96  bone]
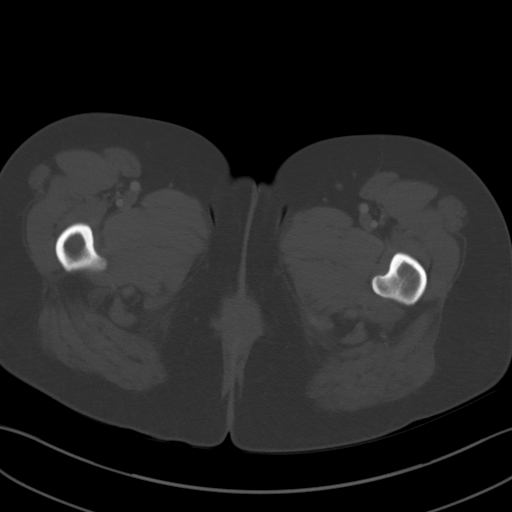
[im 13/96  soft-tissue]
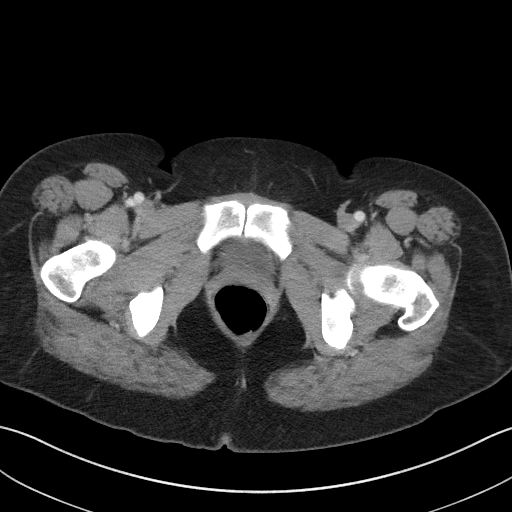
[im 21/96  soft-tissue]
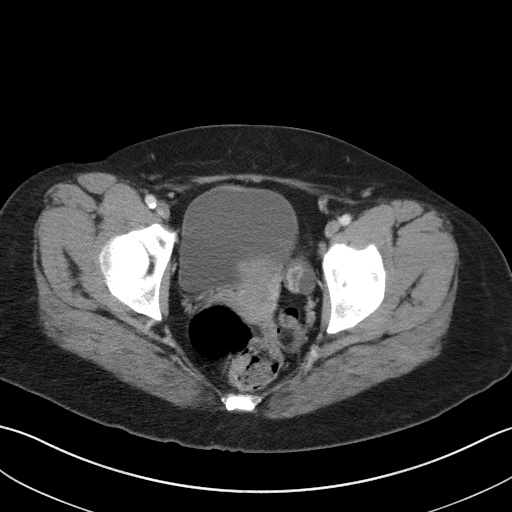
[im 25/96  soft-tissue]
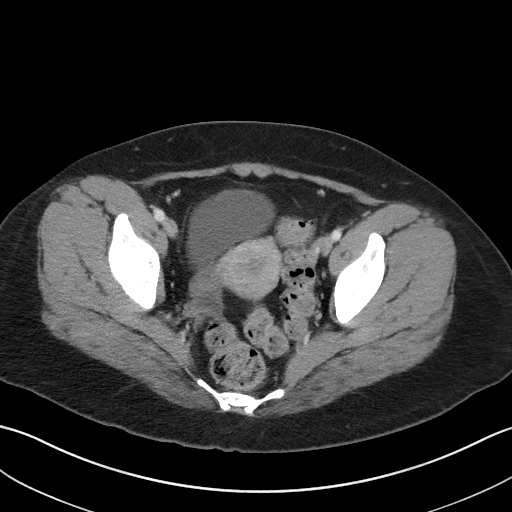
[im 34/96  soft-tissue]
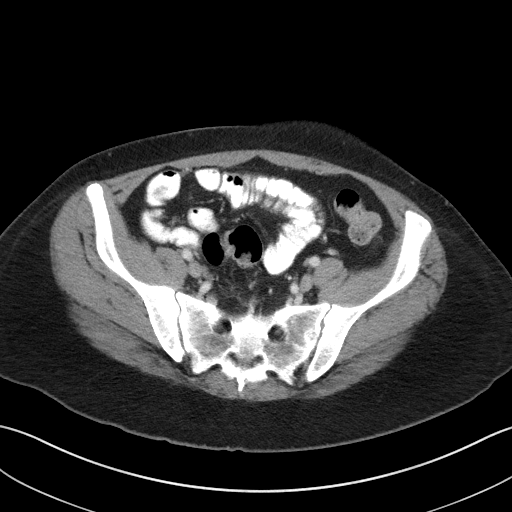
[im 42/96  soft-tissue]
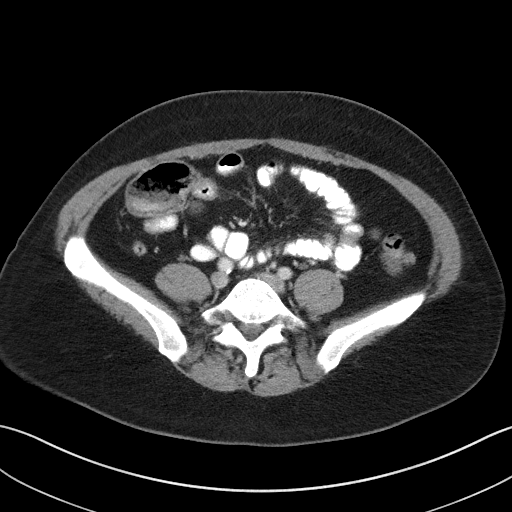
[im 50/96  soft-tissue]
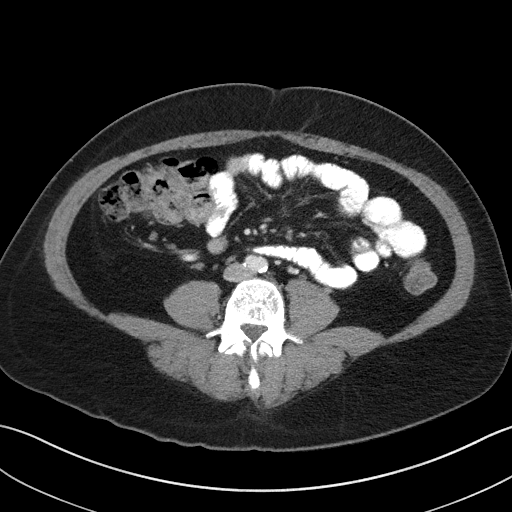
[im 54/96  soft-tissue]
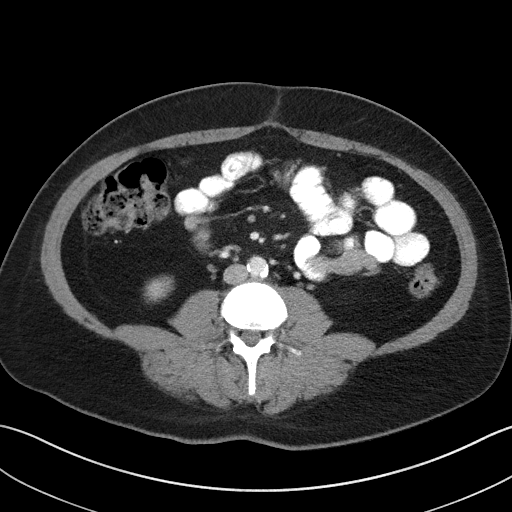
[im 62/96  soft-tissue]
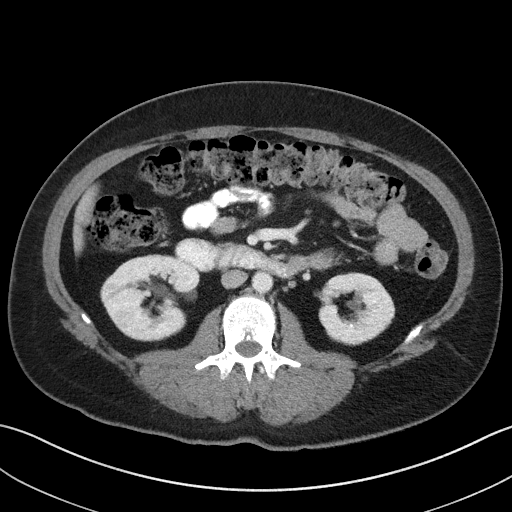
[im 62/96  bone]
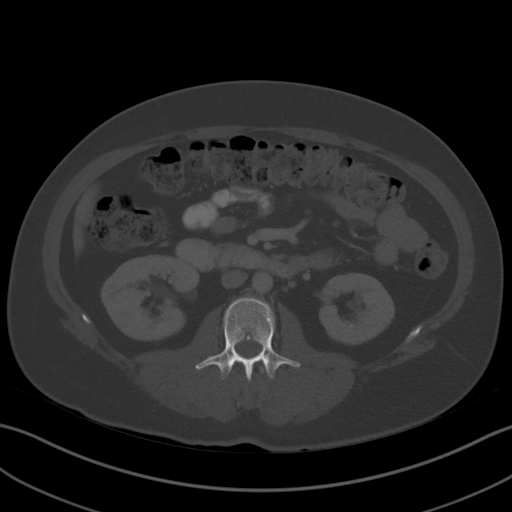
[im 71/96  soft-tissue]
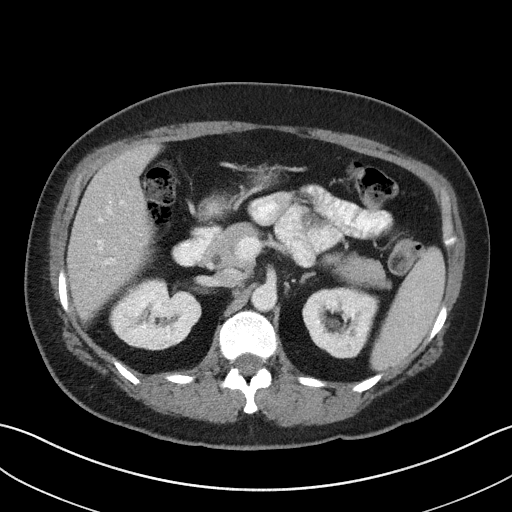
[im 75/96  soft-tissue]
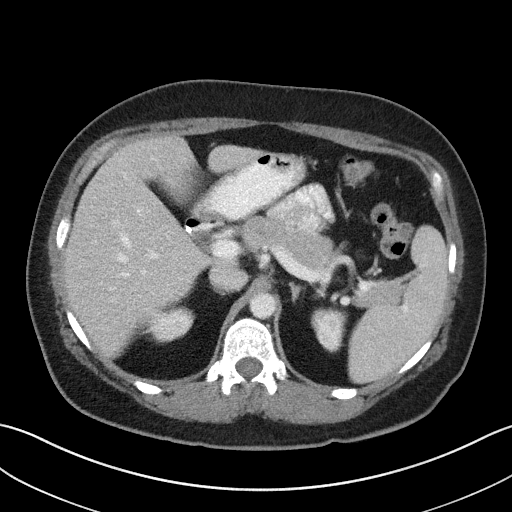
[im 83/96  soft-tissue]
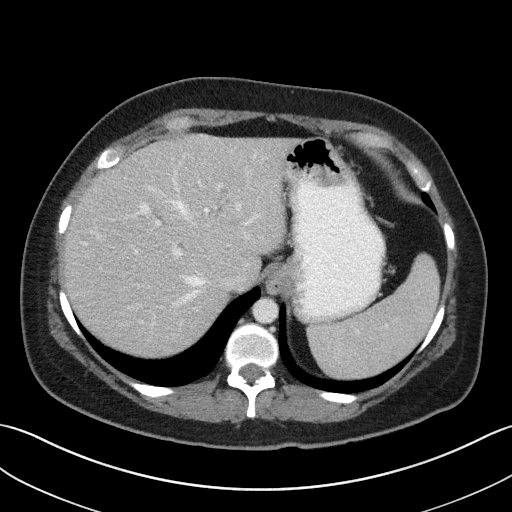
[im 91/96  soft-tissue]
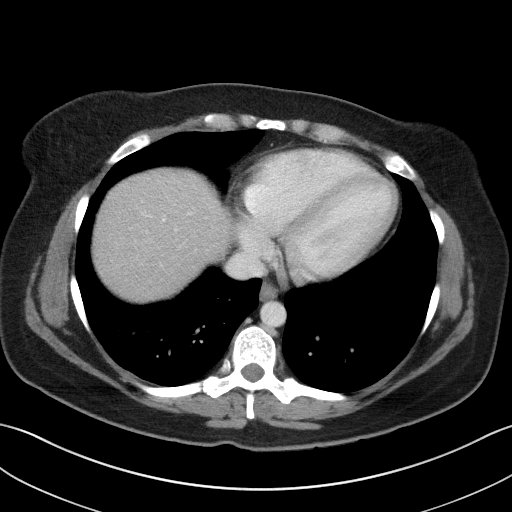

[Series 5: coronal st · coronal · 0.83mm/px · 3 of 84 slices shown]
[im 28/84  soft-tissue]
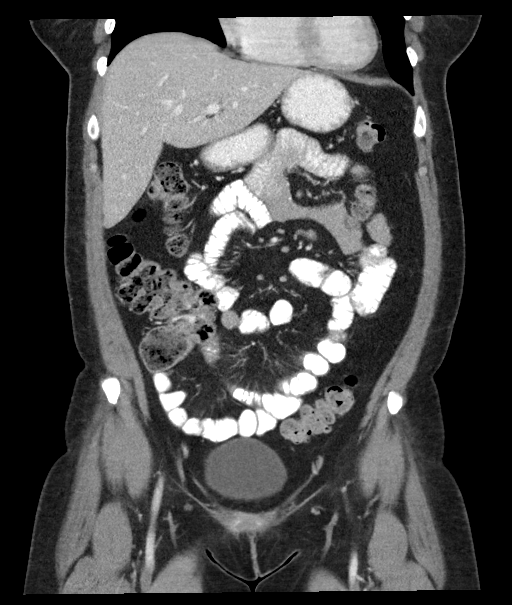
[im 37/84  soft-tissue]
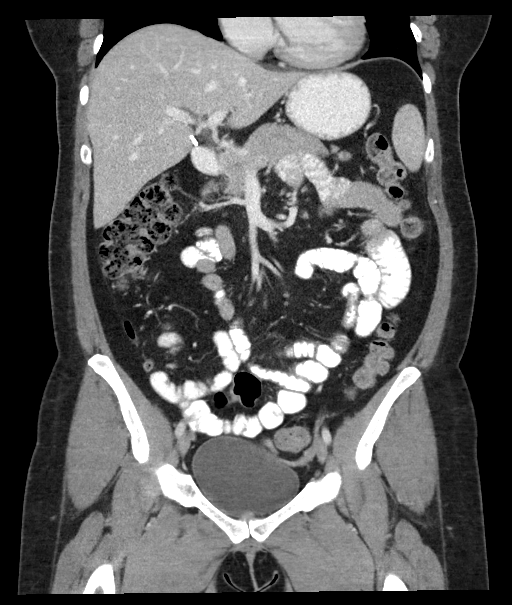
[im 47/84  soft-tissue]
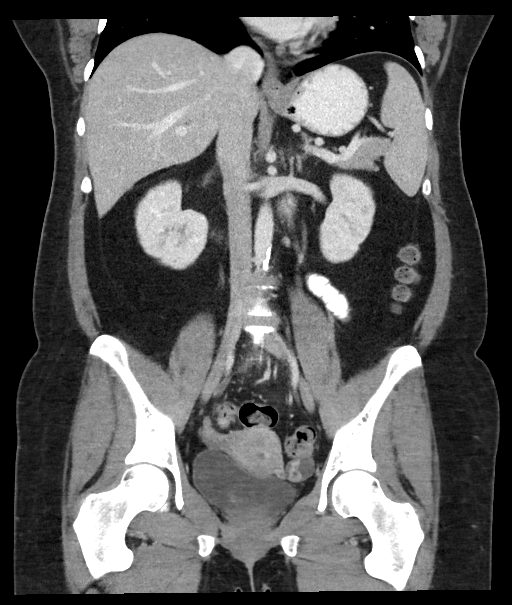

[16 of 46 positions shown; findings below may reference images not displayed]

FINDINGS: Lower chest: No acute abnormality.

Hepatobiliary: No hepatic abnormalities identified. The patient is
status post cholecystectomy. No biliary dilatation.

Pancreas: Unremarkable

Spleen: Unremarkable

Adrenals/Urinary Tract: The kidneys, adrenal glands and bladder are
unremarkable except for a punctate nonobstructing LEFT renal
calculus..

Stomach/Bowel: Stomach is within normal limits. Appendix appears
normal. No evidence of bowel wall thickening, distention, or
inflammatory changes.

Vascular/Lymphatic: Aortic atherosclerosis. No enlarged abdominal or
pelvic lymph nodes.

Reproductive: Uterus and bilateral adnexa are unremarkable.

Other: No ascites, focal collection or pneumoperitoneum.

Musculoskeletal: No acute or suspicious bony abnormalities.
IMPRESSION: 1. No evidence of acute abnormality. No CT findings to suggest a
cause for this patient's abdominal pain.
2. Punctate nonobstructing LEFT renal calculus.
3. Aortic Atherosclerosis ([88]-[88]).

## 2018-08-03 MED ORDER — IOPAMIDOL (ISOVUE-300) INJECTION 61%
100.0000 mL | Freq: Once | INTRAVENOUS | Status: AC | PRN
  Administered 2018-08-03: 100 mL via INTRAVENOUS

## 2018-08-03 MED ORDER — ALUM & MAG HYDROXIDE-SIMETH 200-200-20 MG/5ML PO SUSP
30.0000 mL | Freq: Once | ORAL | Status: AC
  Administered 2018-08-03: 30 mL via ORAL
  Filled 2018-08-03: qty 30

## 2018-08-03 MED ORDER — ONDANSETRON 4 MG PO TBDP
4.0000 mg | ORAL_TABLET | Freq: Once | ORAL | Status: AC | PRN
  Administered 2018-08-03: 4 mg via ORAL
  Filled 2018-08-03: qty 1

## 2018-08-03 MED ORDER — IOPAMIDOL (ISOVUE-300) INJECTION 61%
30.0000 mL | Freq: Once | INTRAVENOUS | Status: AC | PRN
  Administered 2018-08-03: 30 mL via ORAL

## 2018-08-03 MED ORDER — OXYCODONE-ACETAMINOPHEN 5-325 MG PO TABS
1.0000 | ORAL_TABLET | ORAL | Status: AC | PRN
  Administered 2018-08-03 (×2): 1 via ORAL
  Filled 2018-08-03 (×2): qty 1

## 2018-08-03 NOTE — ED Triage Notes (Addendum)
Patient c/o epigastric pain since yesterday with nausea. Hurts worse with movement and after eating. Pt has grimace on face in triage from pain. HX gallbladder removal

## 2018-08-03 NOTE — ED Provider Notes (Addendum)
[facility] Emergency Department Provider Note  ____________________________________________   I have reviewed the triage vital signs and the nursing notes. Where available I have reviewed prior notes and, if possible and indicated, outside hospital notes.    HISTORY  Chief Complaint Abdominal Pain    HPI [name] is a 38 y.o. female presents here today complaining of abdominal pain.  Patient does have a history of fibromyalgia, migraines, and rheumatoid arthritis.  Had a partial hysterectomy she states as well as gallbladder removed.  She does take Remicade and methotrexate.  She states she has epigastric abdominal pain is been there since yesterday.  She also some right lower quadrant abdominal pain.  She denies any fever or chills.  She has no vomiting or chest pain.  The pain is a burning discomfort.  She has had it before in the epigastric region, she talked to her doctor and they told her it was most likely reflux disease.  She has never had an endoscopy.  She has not had much to eat or drink.  She does not think that she is under increased stress.  She is had no fever or chills.  She does not drink alcohol.  Normal bowel movements.  No alleviating or aggravating factors no prior treatment.    Past Medical History:  Diagnosis Date  . Carpal tunnel syndrome, bilateral   . Complication of anesthesia    states gets emotional after anesthesia  . Fibromyalgia   . Hypertension    states under control with meds., has been on med. since 2005  . Menorrhagia 03/2018  . Migraines   . Nephritis 1995   Glomerular nephritis. Age 89.  No issues since.  . Rheumatoid arthritis [facility])     Patient Active Problem List   Diagnosis Date Noted  . Menorrhagia with regular cycle 01/09/2018  . Bilateral carpal tunnel syndrome 11/13/2017  . Elevated liver enzymes 04/02/2017  . Encounter for long-term (current) use of high-risk medication 02/26/2017  . Seropositive  rheumatoid arthritis ([place]) 02/26/2017  . Vitamin D deficiency 02/26/2017  . Chronic fatigue and malaise 02/13/2017  . Anxiety, generalized 07/27/2016  . Hypertriglyceridemia 06/17/2014  . HTN (hypertension), benign 02/13/2011    Past Surgical History:  Procedure Laterality Date  . CARPAL TUNNEL RELEASE Right 04/23/2018   Procedure: ENDOSCOPIC RIGHT CARPAL TUNNEL RELEASE;  Surgeon: Corky Mull, MD;  Location: [place];  Service: Orthopedics;  Laterality: Right;  . CHOLECYSTECTOMY  12/26/2013  . CYSTOCELE REPAIR N/A 08/28/2016   Procedure: ANTERIOR REPAIR (CYSTOCELE);  Surgeon: Emily Filbert, MD;  Location: [facility];  Service: Gynecology;  Laterality: N/A;  . DILATION AND EVACUATION  03/09/2011   Procedure: DILATATION AND EVACUATION (D&E);  Surgeon: Donnamae Jude, MD;  Location: [facility];  Service: Gynecology;  Laterality: N/A;  . HYSTEROSCOPY W/D&C N/A 03/19/2018   Procedure: DILATION AND CURETTAGE /HYSTEROSCOPY WITH MINERVA ABLATION;  Surgeon: Donnamae Jude, MD;  Location: [place];  Service: Gynecology;  Laterality: N/A;  . LAPAROSCOPIC BILATERAL SALPINGECTOMY Bilateral 08/28/2016   Procedure: LAPAROSCOPIC BILATERAL SALPINGECTOMY;  Surgeon: Emily Filbert, MD;  Location: [facility];  Service: Gynecology;  Laterality: Bilateral;  . PERINEOPLASTY N/A 08/28/2016   Procedure: PERINEOPLASTY;  Surgeon: Emily Filbert, MD;  Location: [facility];  Service: Gynecology;  Laterality: N/A;  . TONSILLECTOMY  1988  . TYMPANOSTOMY TUBE PLACEMENT      Prior to Admission medications   Medication Sig Start Date End Date Taking? Authorizing  Provider  atenolol (TENORMIN) 25 MG tablet Take 25 mg by mouth daily.    [provider]  folic acid (FOLVITE) 1 MG tablet Take 1 mg by mouth daily.    [provider]  HYDROcodone-acetaminophen (NORCO/VICODIN) 5-325 MG tablet Take 1-2 tablets by mouth every 6 (six) hours as needed for moderate pain. 04/23/18   Poggi, Marshall Cork, MD  inFLIXimab in  sodium chloride 0.9 % Inject into the vein every 6 (six) weeks.    [provider]  methotrexate (RHEUMATREX) 7.5 MG tablet Take 20 mg by mouth once a week. Caution" Chemotherapy. Protect from light.    [provider]  pregabalin (LYRICA) 25 MG capsule Take 25 mg by mouth 2 (two) times daily.    [provider]  sertraline (ZOLOFT) 100 MG tablet Take 150 mg by mouth daily.    [provider]  traMADol (ULTRAM) 50 MG tablet Take 1 tablet (50 mg total) by mouth every 6 (six) hours as needed. 04/23/18   Poggi, Marshall Cork, MD  triamterene-hydrochlorothiazide (DYAZIDE) 37.5-25 MG capsule Take 1 capsule by mouth daily.    [provider]  Vitamin D, Ergocalciferol, (DRISDOL) 50000 units CAPS capsule Take 50,000 Units by mouth every 7 (seven) days.    [provider]    Allergies Patient has no known allergies.  Family History  Problem Relation Age of Onset  . Hypertension Father   . Heart failure Father   . Hepatitis Father   . Cirrhosis Father   . Hyperlipidemia Father   . Migraines Father   . Mental illness Father        Depression  . Diabetes Mother   . Osteoarthritis Mother   . Hypertension Mother   . Hypothyroidism Mother   . Heart disease Mother   . Hyperlipidemia Mother   . Migraines Mother   . Mental illness Mother        Depression  . Breast cancer Maternal Aunt 70  . Cancer Sister 44       Cervical  . Heart disease Sister        Heart Stops  . Migraines Sister   . Seizures Sister 11       unsure if this or heart problem  . Mental illness Sister        depression  . Heart disease Maternal Grandmother     Social History Social History   Tobacco Use  . Smoking status: Former Smoker    Last attempt to quit: 11/22/2016    Years since quitting: 1.6  . Smokeless tobacco: Never Used  Substance Use Topics  . Alcohol use: No  . Drug use: No    Review of Systems Constitutional: No fever/chills Eyes: No visual  changes. ENT: No sore throat. No stiff neck no neck pain Cardiovascular: Denies chest pain. Respiratory: Denies shortness of breath. Gastrointestinal:   no vomiting.  No diarrhea.  No constipation. Genitourinary: Negative for dysuria. Musculoskeletal: Negative lower extremity swelling Skin: Negative for rash. Neurological: Negative for severe headaches, focal weakness or numbness.   ____________________________________________   PHYSICAL EXAM:  VITAL SIGNS: ED Triage Vitals  Enc Vitals Group     BP 08/03/18 1617 129/88     Pulse Rate 08/03/18 1617 79     Resp --      Temp 08/03/18 1617 98.4 F (36.9 C)     Temp Source 08/03/18 1617 Oral     SpO2 08/03/18 1617 97 %     Weight 08/03/18  1618 175 lb (79.4 kg)     Height 08/03/18 1618 5\' 4"  (1.626 m)     Head Circumference --      Peak Flow --      Pain Score 08/03/18 1625 8     Pain Loc --      Pain Edu? --      Excl. in [place]? --     Constitutional: Alert and oriented. Well appearing and in no acute distress. Eyes: Conjunctivae are normal Head: Atraumatic HEENT: No congestion/rhinnorhea. Mucous membranes are moist.  Oropharynx non-erythematous Neck:   Nontender with no meningismus, no masses, no stridor Cardiovascular: Normal rate, regular rhythm. Grossly normal heart sounds.  Good peripheral circulation. Respiratory: Normal respiratory effort.  No retractions. Lungs CTAB. Abdominal: Soft and tenderness to palpation in the epigastric region but also tenderness palpation in the right lower quadrant. No distention. No guarding no rebound Back:  There is no focal tenderness or step off.  there is no midline tenderness there are no lesions noted. there is no CVA tenderness Musculoskeletal: No lower extremity tenderness, no upper extremity tenderness. No joint effusions, no DVT signs strong distal pulses no edema Neurologic:  Normal speech and language. No gross focal neurologic deficits are appreciated.  Skin:  Skin is warm, dry  and intact. No rash noted. Psychiatric: Mood and affect are normal. Speech and behavior are normal.  ____________________________________________   LABS (all labs ordered are listed, but only abnormal results are displayed)  Labs Reviewed  LIPASE, BLOOD - Abnormal; Notable for the following components:      Result Value   Lipase 56 (*)    All other components within normal limits  COMPREHENSIVE METABOLIC PANEL - Abnormal; Notable for the following components:   Potassium 3.0 (*)    Glucose, Bld 131 (*)    BUN 22 (*)    AST 88 (*)    ALT 135 (*)    All other components within normal limits  URINALYSIS, COMPLETE (UACMP) WITH MICROSCOPIC - Abnormal; Notable for the following components:   Color, Urine YELLOW (*)    APPearance CLEAR (*)    All other components within normal limits  CBC  POC URINE PREG, ED  POCT PREGNANCY, URINE    Pertinent labs  results that were available during my care of the patient were reviewed by me and considered in my medical decision making (see chart for details). ____________________________________________  EKG  I personally interpreted any EKGs ordered by me or triage Rate 73 bpm no acute ST elevation or depression, RSR prime configuration, nonspecific ST changes no change from prior ____________________________________________  RADIOLOGY  Pertinent labs & imaging results that were available during my care of the patient were reviewed by me and considered in my medical decision making (see chart for details). If possible, patient and/or family made aware of any abnormal findings.  US Abdomen Limited Ruq  Result Date: 08/03/2018 CLINICAL DATA:  Right upper quadrant pain. EXAM: ULTRASOUND ABDOMEN LIMITED RIGHT UPPER QUADRANT COMPARISON:  None. FINDINGS: Gallbladder: Prior cholecystectomy. Common bile duct: Diameter: 4.3 mm Liver: No focal lesion identified. Within normal limits in parenchymal echogenicity. No intrahepatic or extrahepatic biliary  ductal dilatation. Portal vein is patent on color Doppler imaging with normal direction of blood flow towards the liver. IMPRESSION: 1. Prior cholecystectomy. 2. Otherwise normal right upper quadrant ultrasound. Electronically Signed   By: Kathreen Devoid   On: 08/03/2018 19:11   ____________________________________________    PROCEDURES  Procedure(s) performed: None  Procedures  Critical Care performed: None  ____________________________________________   INITIAL IMPRESSION / ASSESSMENT AND PLAN / ED COURSE  Pertinent labs & imaging results that were available during my care of the patient were reviewed by me and considered in my medical decision making (see chart for details).  Patient here with abdominal pain, there is some pain to the right lower quadrant she is on immunocompromising medications, otherwise, very reassuring work-up thus far liver function tests are slightly elevated, right upper quadrant ultrasound shows no biliary dilatation or evidence of retained stone, could have passed a gallstone I suppose, she mostly is epigastric pain but given that there is some right lower quadrant pain I will also obtain a CT scan.  Low suspicion for acute surgical pathology however function test could be elevated as a side effect of some of the medication she is on as well.  Is a very mild transaminase ovation.  Urinalysis is reassuring.  If CT is negative is my hope that we get her safely home.  This is very reproducible epigastric food related discomfort, and right lower quadrant pain low suspicion for ACS.  ----------------------------------------- 12:01 AM on 08/04/2018 ----------------------------------------- CT is negative, we will send a troponin because of some EKG irregularities, EKG is not markedly different from prior. Signed out to Dr. Owens Shark at the end of my shift   ____________________________________________   FINAL CLINICAL IMPRESSION(S) / ED DIAGNOSES  Final diagnoses:   RUQ pain      This chart was dictated using voice recognition software.  Despite best efforts to proofread,  errors can occur which can change meaning.      Schuyler Amor, MD 08/03/18 [id number]    Schuyler Amor, MD 08/04/18 0001    Schuyler Amor, MD 08/04/18 Quentin Mulling    Schuyler Amor, MD 08/04/18 [phone]

## 2018-08-03 NOTE — ED Notes (Signed)
CT Geoff called and notified pt finished her oral contrast about 10 minutes ago

## 2018-08-03 NOTE — ED Notes (Signed)
Pt ambulatory with steady gait to use bathroom in hallway; back to bed, will change into gown for CT scan

## 2018-08-04 LAB — TROPONIN I
Troponin I: <0.03 ng/mL (ref <0.03)
Troponin I: <0.03 ng/mL (ref <0.03)

## 2018-08-04 MED ORDER — FAMOTIDINE 20 MG PO TABS: 20.0000 mg | ORAL_TABLET | Freq: Every day | ORAL | 0 refills | Status: AC

## 2018-08-04 NOTE — Progress Notes (Deleted)
Last pap 05/29/2018-normal

## 2018-08-04 NOTE — ED Provider Notes (Signed)
Patient was signed out to me by Dr. Burlene Arnt at 11:00 PM with recommendation to follow-up on troponin and if negative discharge patient home.  Patient's troponin negative less than 0.03 and as such patient was discharged home.   Gregor Hams, MD 08/04/18 [phone]

## 2018-08-05 ENCOUNTER — Ambulatory Visit: Payer: 59 | Admitting: Obstetrics & Gynecology

## 2018-08-05 DIAGNOSIS — R748 Abnormal levels of other serum enzymes: Secondary | ICD-10-CM | POA: Diagnosis not present

## 2018-08-05 DIAGNOSIS — R1084 Generalized abdominal pain: Secondary | ICD-10-CM | POA: Diagnosis not present

## 2018-08-05 DIAGNOSIS — I10 Essential (primary) hypertension: Secondary | ICD-10-CM | POA: Diagnosis not present

## 2018-08-11 DIAGNOSIS — M059 Rheumatoid arthritis with rheumatoid factor, unspecified: Secondary | ICD-10-CM | POA: Diagnosis not present

## 2018-08-11 DIAGNOSIS — E781 Pure hyperglyceridemia: Secondary | ICD-10-CM | POA: Diagnosis not present

## 2018-08-11 DIAGNOSIS — I10 Essential (primary) hypertension: Secondary | ICD-10-CM | POA: Diagnosis not present

## 2018-08-11 DIAGNOSIS — Z79899 Other long term (current) drug therapy: Secondary | ICD-10-CM | POA: Diagnosis not present

## 2018-08-18 DIAGNOSIS — R748 Abnormal levels of other serum enzymes: Secondary | ICD-10-CM | POA: Diagnosis not present

## 2018-08-19 DIAGNOSIS — I10 Essential (primary) hypertension: Secondary | ICD-10-CM | POA: Diagnosis not present

## 2018-08-19 DIAGNOSIS — R945 Abnormal results of liver function studies: Secondary | ICD-10-CM | POA: Diagnosis not present

## 2018-08-20 DIAGNOSIS — M059 Rheumatoid arthritis with rheumatoid factor, unspecified: Secondary | ICD-10-CM | POA: Diagnosis not present

## 2018-08-20 DIAGNOSIS — I10 Essential (primary) hypertension: Secondary | ICD-10-CM | POA: Diagnosis not present

## 2018-08-20 DIAGNOSIS — Z0000 Encounter for general adult medical examination without abnormal findings: Secondary | ICD-10-CM | POA: Diagnosis not present

## 2018-08-23 DIAGNOSIS — M25561 Pain in right knee: Secondary | ICD-10-CM | POA: Diagnosis not present

## 2018-08-31 DIAGNOSIS — J028 Acute pharyngitis due to other specified organisms: Secondary | ICD-10-CM | POA: Diagnosis not present

## 2018-08-31 DIAGNOSIS — B9789 Other viral agents as the cause of diseases classified elsewhere: Secondary | ICD-10-CM | POA: Diagnosis not present

## 2018-08-31 DIAGNOSIS — R509 Fever, unspecified: Secondary | ICD-10-CM | POA: Diagnosis not present

## 2018-08-31 DIAGNOSIS — J019 Acute sinusitis, unspecified: Secondary | ICD-10-CM | POA: Diagnosis not present

## 2018-09-01 DIAGNOSIS — Z79899 Other long term (current) drug therapy: Secondary | ICD-10-CM | POA: Diagnosis not present

## 2018-09-01 DIAGNOSIS — M059 Rheumatoid arthritis with rheumatoid factor, unspecified: Secondary | ICD-10-CM | POA: Diagnosis not present

## 2018-09-01 DIAGNOSIS — M76891 Other specified enthesopathies of right lower limb, excluding foot: Secondary | ICD-10-CM | POA: Diagnosis not present

## 2018-09-04 ENCOUNTER — Ambulatory Visit: Payer: 59 | Admitting: Family Medicine

## 2018-09-19 DIAGNOSIS — B9789 Other viral agents as the cause of diseases classified elsewhere: Secondary | ICD-10-CM | POA: Diagnosis not present

## 2018-09-19 DIAGNOSIS — R509 Fever, unspecified: Secondary | ICD-10-CM | POA: Diagnosis not present

## 2018-09-19 DIAGNOSIS — J028 Acute pharyngitis due to other specified organisms: Secondary | ICD-10-CM | POA: Diagnosis not present

## 2018-09-19 DIAGNOSIS — J069 Acute upper respiratory infection, unspecified: Secondary | ICD-10-CM | POA: Diagnosis not present

## 2018-09-19 DIAGNOSIS — R05 Cough: Secondary | ICD-10-CM | POA: Diagnosis not present

## 2018-09-24 ENCOUNTER — Ambulatory Visit: Admit: 2018-09-24 | Payer: 59 | Admitting: Surgery

## 2018-09-24 SURGERY — RELEASE, CARPAL TUNNEL, ENDOSCOPIC
Anesthesia: Choice | Laterality: Left

## 2018-10-06 DIAGNOSIS — M059 Rheumatoid arthritis with rheumatoid factor, unspecified: Secondary | ICD-10-CM | POA: Diagnosis not present

## 2018-10-07 ENCOUNTER — Ambulatory Visit: Payer: 59 | Admitting: Obstetrics & Gynecology

## 2018-11-07 DIAGNOSIS — G8929 Other chronic pain: Secondary | ICD-10-CM | POA: Diagnosis not present

## 2018-11-07 DIAGNOSIS — M059 Rheumatoid arthritis with rheumatoid factor, unspecified: Secondary | ICD-10-CM | POA: Diagnosis not present

## 2018-11-07 DIAGNOSIS — R52 Pain, unspecified: Secondary | ICD-10-CM | POA: Diagnosis not present

## 2018-11-17 DIAGNOSIS — M059 Rheumatoid arthritis with rheumatoid factor, unspecified: Secondary | ICD-10-CM | POA: Diagnosis not present

## 2019-02-03 ENCOUNTER — Other Ambulatory Visit: Payer: Self-pay | Admitting: Internal Medicine

## 2019-02-03 DIAGNOSIS — M7989 Other specified soft tissue disorders: Secondary | ICD-10-CM

## 2019-02-05 ENCOUNTER — Other Ambulatory Visit: Payer: Self-pay

## 2019-02-05 ENCOUNTER — Ambulatory Visit
Admission: RE | Admit: 2019-02-05 | Discharge: 2019-02-05 | Disposition: A | Discharge status: Home / Self Care | Payer: 59 | Source: Ambulatory Visit | Attending: Internal Medicine | Admitting: Internal Medicine

## 2019-02-05 DIAGNOSIS — M7989 Other specified soft tissue disorders: Secondary | ICD-10-CM | POA: Insufficient documentation

## 2019-02-05 IMAGING — US RIGHT LOWER EXTREMITY SOFT TISSUE ULTRASOUND LIMITED
1 series · 4 of 4 positions shown · non-contrast
Comparison: None.

CLINICAL DATA: Soft tissue mass

EXAM:
ULTRASOUND RIGHT LOWER EXTREMITY LIMITED
TECHNIQUE: Ultrasound examination of the lower extremity soft tissues was
performed in the area of clinical concern.

[Series 1: right lower extremity soft tissue ultrasound limit · 0.07mm/px · 4 of 4 slices shown]
[im 1/4]
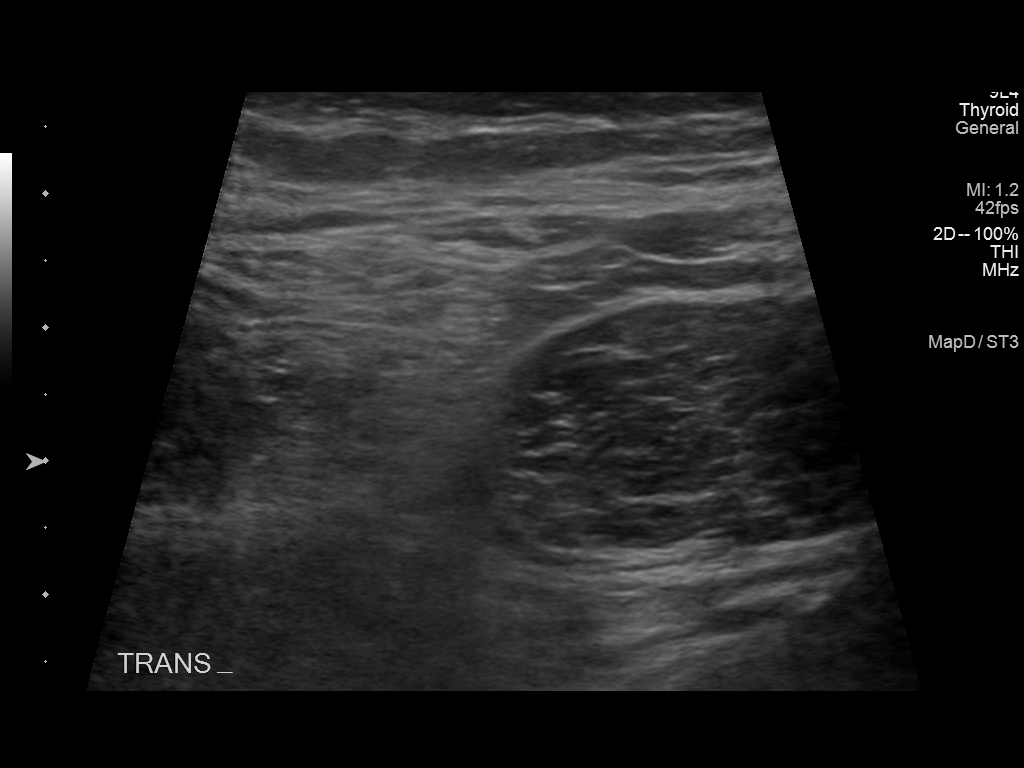
[im 2/4]
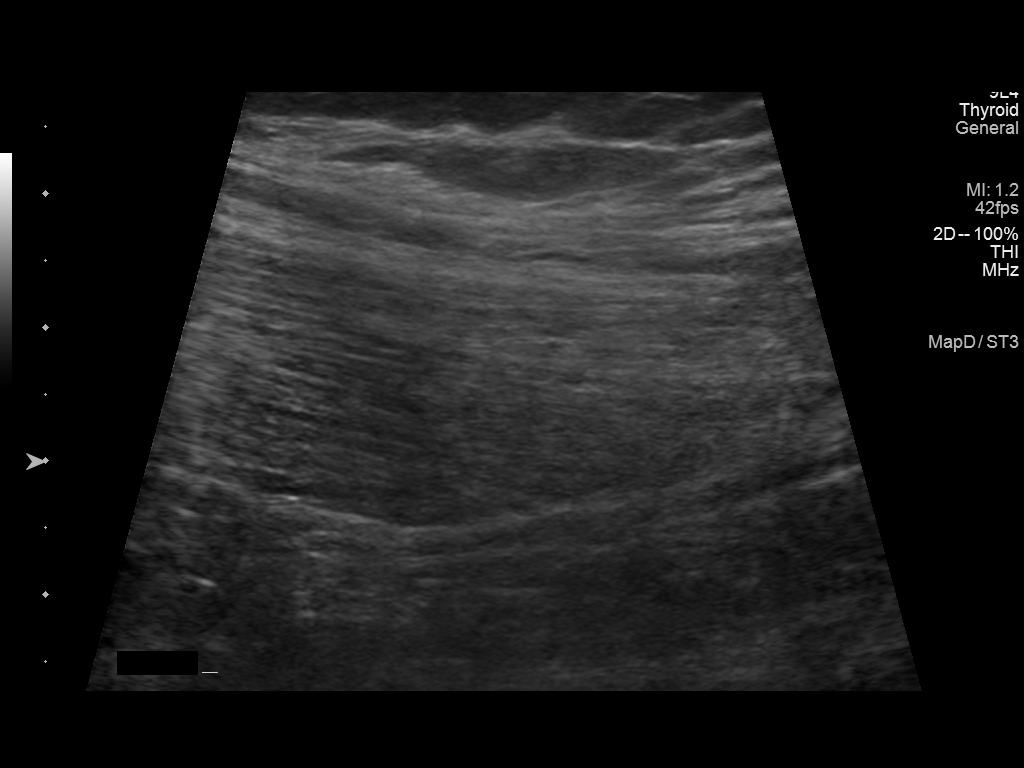
[im 3/4]
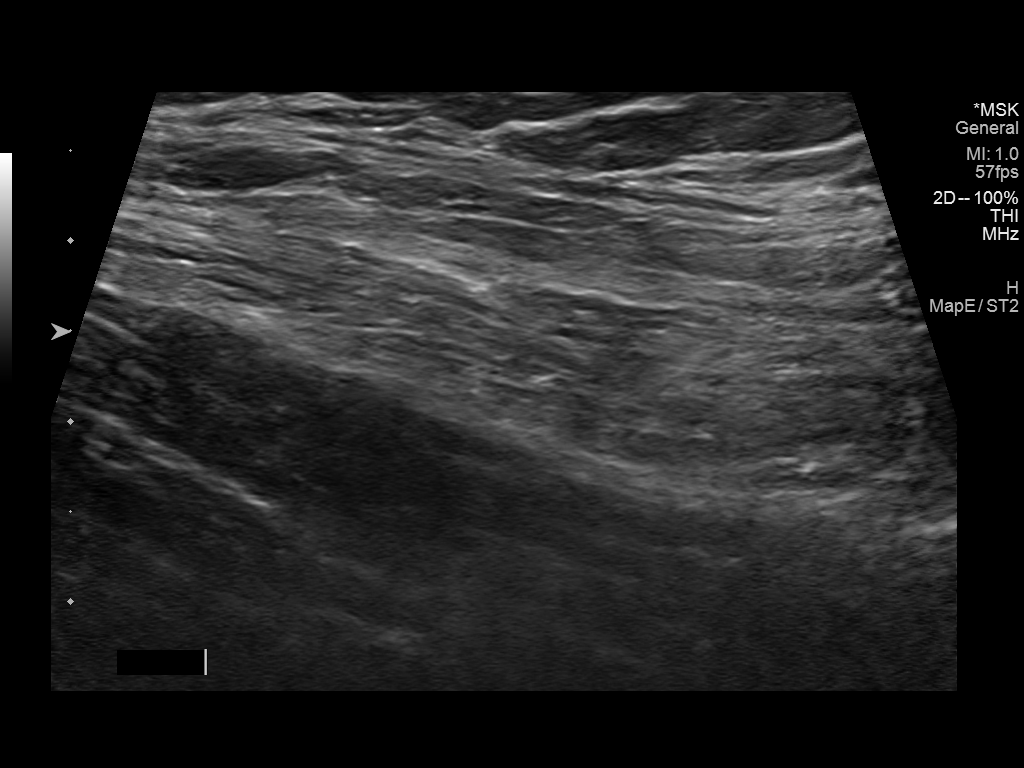
[im 4/4]
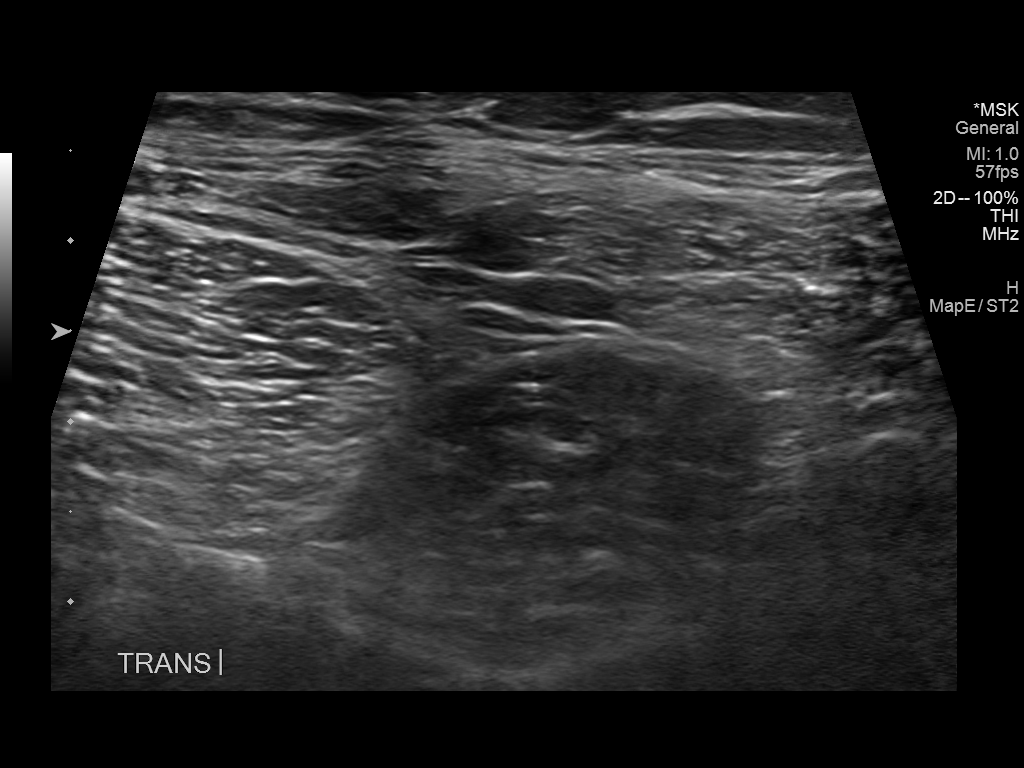

[4 of 4 positions shown; findings below may reference images not displayed]

FINDINGS: No visible soft tissue cystic or solid mass. No visible abnormality.
IMPRESSION: No sonographic abnormality in the area concern.

## 2019-02-28 ENCOUNTER — Other Ambulatory Visit: Payer: Self-pay

## 2019-02-28 ENCOUNTER — Emergency Department: Payer: 59

## 2019-02-28 ENCOUNTER — Encounter: Payer: Self-pay | Admitting: Emergency Medicine

## 2019-02-28 ENCOUNTER — Emergency Department
Admission: EM | Admit: 2019-02-28 | Discharge: 2019-02-28 | Disposition: A | Discharge status: Home / Self Care | Payer: 59 | Attending: Emergency Medicine | Admitting: Emergency Medicine

## 2019-02-28 DIAGNOSIS — M5412 Radiculopathy, cervical region: Secondary | ICD-10-CM | POA: Diagnosis not present

## 2019-02-28 DIAGNOSIS — I10 Essential (primary) hypertension: Secondary | ICD-10-CM | POA: Diagnosis not present

## 2019-02-28 DIAGNOSIS — Z87891 Personal history of nicotine dependence: Secondary | ICD-10-CM | POA: Diagnosis not present

## 2019-02-28 DIAGNOSIS — M79601 Pain in right arm: Secondary | ICD-10-CM | POA: Diagnosis present

## 2019-02-28 IMAGING — CR CERVICAL SPINE - 2-3 VIEW
1 series · 3 of 3 positions shown · non-contrast
Comparison: None.

CLINICAL DATA: Cervical radiculopathy, no injury

EXAM:
CERVICAL SPINE - 2-3 VIEW

[Series 1: dg cervical spine 2 or 3 views · 0.14mm/px · 3 of 3 slices shown]
[im 1/3]
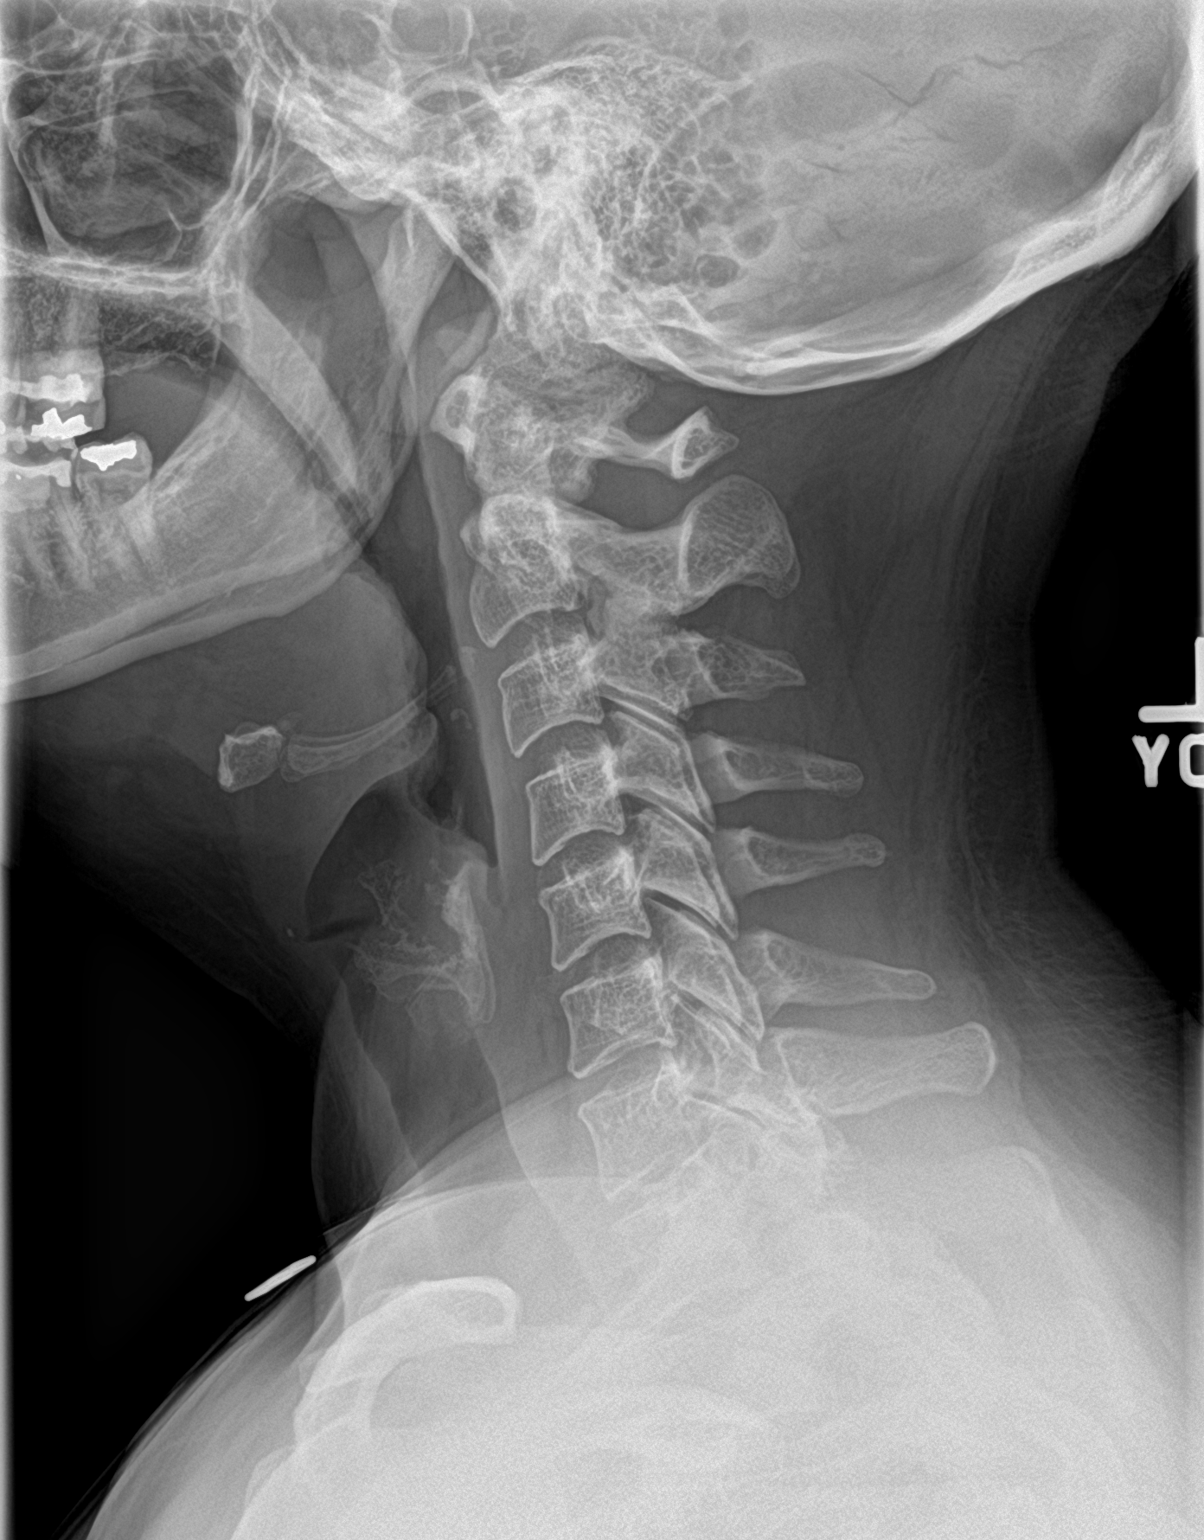
[im 2/3]
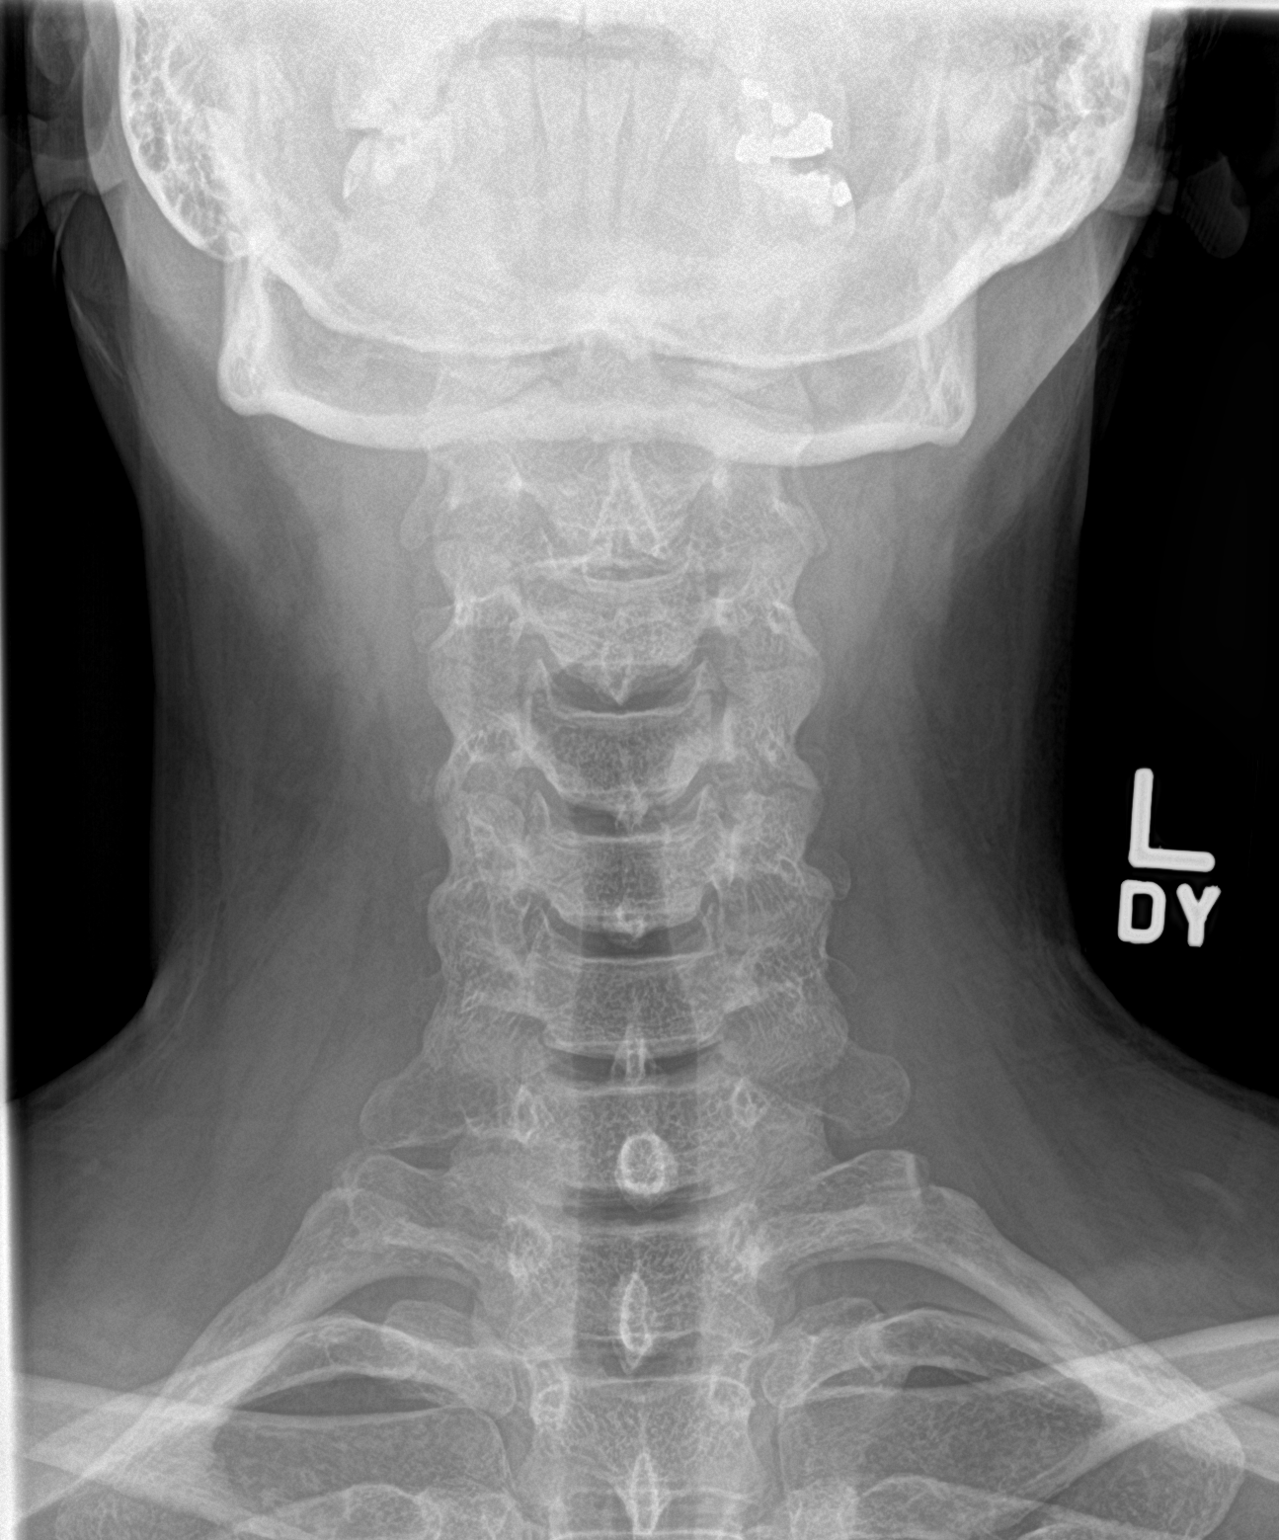
[im 3/3]
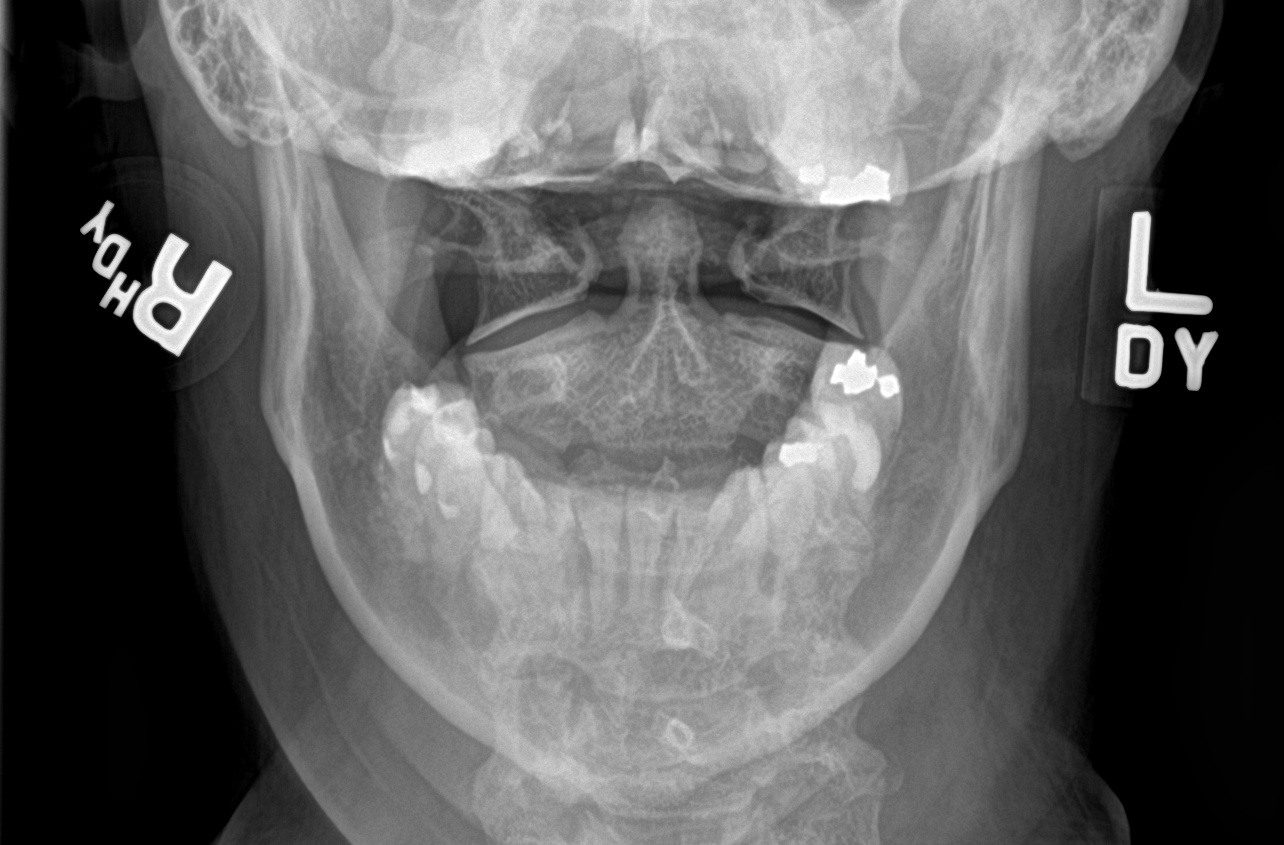

[3 of 3 positions shown; findings below may reference images not displayed]

FINDINGS: No fracture or dislocation of the cervical spine. Straightening of
the normal cervical lordosis, likely positional. Disc spaces and
vertebral body heights are preserved. Soft tissues, skull base, and
partially included lung apices are unremarkable.
IMPRESSION: No fracture or dislocation of the cervical spine. Disc spaces and
vertebral body heights are preserved. MRI may be used to further
evaluate cervical disc and neural foraminal pathology if indicated
by localizing signs and symptoms.

## 2019-02-28 MED ORDER — PREDNISONE 10 MG (21) PO TBPK: ORAL_TABLET | ORAL | 0 refills | Status: DC

## 2019-02-28 MED ORDER — BACLOFEN 10 MG PO TABS: 10.0000 mg | ORAL_TABLET | Freq: Three times a day (TID) | ORAL | 1 refills | Status: AC

## 2019-02-28 NOTE — ED Provider Notes (Signed)
[facility] Emergency Department Provider Note  ____________________________________________   First MD Initiated Contact with Patient 02/28/19 1402     (approximate)  I have reviewed the triage vital signs and the nursing notes.   HISTORY  Chief Complaint Neck Pain and Arm Pain    HPI [name] is a 38 y.o. female presents emergency department complaining of right arm and right-sided neck pain that started hurting earlier today.  Patient has history of RA and is on Remicade which has not been helping.  Patient states she was at [facility] walking around and lifted her child when the pain started.  Pain is radiating to the elbow and the fourth and fifth fingers of the right hand feel numb.  She denies any known injury.    Past Medical History:  Diagnosis Date  . Carpal tunnel syndrome, bilateral   . Complication of anesthesia    states gets emotional after anesthesia  . Fibromyalgia   . Hypertension    states under control with meds., has been on med. since 2005  . Menorrhagia 03/2018  . Migraines   . Nephritis 1995   Glomerular nephritis. Age 1.  No issues since.  . Rheumatoid arthritis [facility])     Patient Active Problem List   Diagnosis Date Noted  . Menorrhagia with regular cycle 01/09/2018  . Bilateral carpal tunnel syndrome 11/13/2017  . Elevated liver enzymes 04/02/2017  . Encounter for long-term (current) use of high-risk medication 02/26/2017  . Seropositive rheumatoid arthritis ([place]) 02/26/2017  . Vitamin D deficiency 02/26/2017  . Chronic fatigue and malaise 02/13/2017  . Anxiety, generalized 07/27/2016  . Hypertriglyceridemia 06/17/2014  . HTN (hypertension), benign 02/13/2011    Past Surgical History:  Procedure Laterality Date  . CARPAL TUNNEL RELEASE Right 04/23/2018   Procedure: ENDOSCOPIC RIGHT CARPAL TUNNEL RELEASE;  Surgeon: Corky Mull, MD;  Location: [place];  Service: Orthopedics;  Laterality: Right;   . CHOLECYSTECTOMY  12/26/2013  . CYSTOCELE REPAIR N/A 08/28/2016   Procedure: ANTERIOR REPAIR (CYSTOCELE);  Surgeon: Emily Filbert, MD;  Location: [place] [facility];  Service: Gynecology;  Laterality: N/A;  . DILATION AND EVACUATION  03/09/2011   Procedure: DILATATION AND EVACUATION (D&E);  Surgeon: Donnamae Jude, MD;  Location: [place] [facility];  Service: Gynecology;  Laterality: N/A;  . HYSTEROSCOPY W/D&C N/A 03/19/2018   Procedure: DILATION AND CURETTAGE /HYSTEROSCOPY WITH MINERVA ABLATION;  Surgeon: Donnamae Jude, MD;  Location: [place];  Service: Gynecology;  Laterality: N/A;  . LAPAROSCOPIC BILATERAL SALPINGECTOMY Bilateral 08/28/2016   Procedure: LAPAROSCOPIC BILATERAL SALPINGECTOMY;  Surgeon: Emily Filbert, MD;  Location: [place] [facility];  Service: Gynecology;  Laterality: Bilateral;  . PERINEOPLASTY N/A 08/28/2016   Procedure: PERINEOPLASTY;  Surgeon: Emily Filbert, MD;  Location: [place] [facility];  Service: Gynecology;  Laterality: N/A;  . TONSILLECTOMY  1988  . TYMPANOSTOMY TUBE PLACEMENT      Prior to Admission medications   Medication Sig Start Date End Date Taking? Authorizing Provider  atenolol (TENORMIN) 25 MG tablet Take 25 mg by mouth daily.    [provider]  baclofen (LIORESAL) 10 MG tablet Take 1 tablet (10 mg total) by mouth 3 (three) times daily. 02/28/19 02/28/20  Allison Deshotels, Linden Dolin, PA-C  famotidine (PEPCID) 20 MG tablet Take 1 tablet (20 mg total) by mouth daily for 30 days. 08/04/18 09/03/18  Schuyler Amor, MD  folic acid (FOLVITE) 1 MG tablet Take 1 mg by mouth daily.    [provider]  HYDROcodone-acetaminophen (NORCO/VICODIN) 5-325 MG tablet Take 1-2 tablets by mouth every 6 (six) hours as needed for moderate pain. 04/23/18   Poggi, Marshall Cork, MD  inFLIXimab in sodium chloride 0.9 % Inject into the vein every 6 (six) weeks.    [provider]  methotrexate (RHEUMATREX) 7.5 MG tablet Take 20 mg by mouth once a week. Caution" Chemotherapy. Protect from light.    [provider]  predniSONE (STERAPRED UNI-PAK 21 TAB) 10 MG (21) TBPK tablet Take 6 pills on day one then decrease by 1 pill each day 02/28/19   Versie Starks, PA-C  pregabalin (LYRICA) 25 MG capsule Take 25 mg by mouth 2 (two) times daily.    [provider]  sertraline (ZOLOFT) 100 MG tablet Take 150 mg by mouth daily.    [provider]  traMADol (ULTRAM) 50 MG tablet Take 1 tablet (50 mg total) by mouth every 6 (six) hours as needed. 04/23/18   Poggi, Marshall Cork, MD  triamterene-hydrochlorothiazide (DYAZIDE) 37.5-25 MG capsule Take 1 capsule by mouth daily.    [provider]  Vitamin D, Ergocalciferol, (DRISDOL) 50000 units CAPS capsule Take 50,000 Units by mouth every 7 (seven) days.    [provider]    Allergies Patient has no known allergies.  Family History  Problem Relation Age of Onset  . Hypertension Father   . Heart failure Father   . Hepatitis Father   . Cirrhosis Father   . Hyperlipidemia Father   . Migraines Father   . Mental illness Father        Depression  . Diabetes Mother   . Osteoarthritis Mother   . Hypertension Mother   . Hypothyroidism Mother   . Heart disease Mother   . Hyperlipidemia Mother   . Migraines Mother   . Mental illness Mother        Depression  . Breast cancer Maternal Aunt 70  . Cancer Sister 33       Cervical  . Heart disease Sister        Heart Stops  . Migraines Sister   . Seizures Sister 11       unsure if this or heart problem  . Mental illness Sister        depression  . Heart disease Maternal Grandmother     Social History Social History   Tobacco Use  . Smoking status: Former Smoker    Quit date: 11/22/2016    Years since quitting: 2.2  . Smokeless tobacco: Never Used  Substance Use Topics  . Alcohol use: No  . Drug use: No    Review of Systems  Constitutional: No fever/chills Eyes: No visual changes. ENT: No sore throat. Respiratory: Denies cough Genitourinary: Negative for  dysuria. Musculoskeletal: Negative for back pain.  Positive for neck pain with radiation to the right arm Skin: Negative for rash.    ____________________________________________   PHYSICAL EXAM:  VITAL SIGNS: ED Triage Vitals  Enc Vitals Group     BP 02/28/19 1332 122/85     Pulse Rate 02/28/19 1332 62     Resp 02/28/19 1332 16     Temp 02/28/19 1332 98.1 F (36.7 C)     Temp Source 02/28/19 1332 Oral     SpO2 02/28/19 1332 97 %     Weight 02/28/19 1331 174 lb (78.9 kg)     Height 02/28/19 1331 5\' 3"  (1.6 m)     Head Circumference --  Peak Flow --      Pain Score 02/28/19 1330 6     Pain Loc --      Pain Edu? --      Excl. in [place]? --     Constitutional: Alert and oriented. Well appearing and in no acute distress. Eyes: Conjunctivae are normal.  Head: Atraumatic. Nose: No congestion/rhinnorhea. Mouth/Throat: Mucous membranes are moist.   Neck:  supple no lymphadenopathy noted Cardiovascular: Normal rate, regular rhythm.  Respiratory: Normal respiratory effort.  No retractions,  GU: deferred Musculoskeletal: FROM all extremities, warm and well perfused, grip on the right hand is decreased when compared to left, C-spine and right shoulder are slightly tender, neurovascular intact Neurologic:  Normal speech and language.  Skin:  Skin is warm, dry and intact. No rash noted. Psychiatric: Mood and affect are normal. Speech and behavior are normal.  ____________________________________________   LABS (all labs ordered are listed, but only abnormal results are displayed)  Labs Reviewed - No data to display ____________________________________________   ____________________________________________  RADIOLOGY  X-rays C-spine is negative for any acute abnormality  ____________________________________________   PROCEDURES  Procedure(s) performed: No  Procedures    ____________________________________________   INITIAL IMPRESSION / ASSESSMENT AND PLAN /  ED COURSE  Pertinent labs & imaging results that were available during my care of the patient were reviewed by me and considered in my medical decision making (see chart for details).   Patient is 38 year old female presents emergency department complaining of right-sided neck pain with radiation to the right arm.  No known injury.  History of RA and is currently taking Remicade.  Physical exam shows patient to appear well.  C-spine and right shoulder slightly tender.  Grip in the right hand is slightly decreased when compared to the left.  Remainder exam is unremarkable  X-ray of the C-spine is negative for any acute abnormality  Explained findings to the patient.  Explained to her that we will do a trial of steroids and muscle relaxer see if this resolves the pain into the right arm.  However if she does not improve or is worsening she should either follow-up with her regular doctor or return emergency department.  She is to call her physician as to whether to take her methotrexate on Tuesday.  Explained to her that taking methotrexate along steroid may be too much for her kidneys.  She states she understands and will call her doctor for advice.  She was discharged in stable condition.    CHAMIQUE MATUSEK was evaluated in Emergency Department on 02/28/2019 for the symptoms described in the history of present illness. She was evaluated in the context of the global COVID-19 pandemic, which necessitated consideration that the patient might be at risk for infection with the SARS-CoV-2 virus that causes COVID-19. Institutional protocols and algorithms that pertain to the evaluation of patients at risk for COVID-19 are in a state of rapid change based on information released by regulatory bodies including the CDC and federal and state organizations. These policies and algorithms were followed during the patient's care in the ED.   As part of my medical decision making, I reviewed the following data  within the [place] notes reviewed and incorporated, Old chart reviewed, Radiograph reviewed x-ray C-spine same, Notes from prior ED visits and [place] Controlled Substance Database  ____________________________________________   FINAL CLINICAL IMPRESSION(S) / ED DIAGNOSES  Final diagnoses:  Cervical radiculopathy      NEW MEDICATIONS STARTED DURING THIS VISIT:  New Prescriptions   BACLOFEN (LIORESAL) 10 MG TABLET    Take 1 tablet (10 mg total) by mouth 3 (three) times daily.   PREDNISONE (STERAPRED UNI-PAK 21 TAB) 10 MG (21) TBPK TABLET    Take 6 pills on day one then decrease by 1 pill each day     Note:  This document was prepared using Dragon voice recognition software and may include unintentional dictation errors.    Versie Starks, PA-C 02/28/19 1551    Blake Divine, MD 02/28/19 2225

## 2019-02-28 NOTE — ED Notes (Signed)
Pt states right arm and right neck started hurting and were tingling beginning today. Pt states it burns and aches.

## 2019-02-28 NOTE — Discharge Instructions (Addendum)
Follow-up with your regular doctor if not better in 5 days.  Return emergency department worsening.  If the medications do not help with the pain radiating into your arm I would suggest a MRI of the C-spine.  You may also try over-the-counter tumeric or tart cherry as these help with inflammation.

## 2019-02-28 NOTE — ED Triage Notes (Signed)
Pt to ED via POV c/o neck pain and right arm pain. Pt states that she was at [employer] with her family, pt was lifting her kids and on the way home noticed the pain started. Pt state that it feels like someone is constantly hitting her in her elbow. Pt is in NAD.

## 2019-04-15 ENCOUNTER — Other Ambulatory Visit: Payer: Self-pay | Admitting: Obstetrics and Gynecology

## 2019-04-15 DIAGNOSIS — Z1231 Encounter for screening mammogram for malignant neoplasm of breast: Secondary | ICD-10-CM

## 2019-05-18 ENCOUNTER — Other Ambulatory Visit: Payer: Self-pay

## 2019-05-18 ENCOUNTER — Ambulatory Visit
Admission: RE | Admit: 2019-05-18 | Discharge: 2019-05-18 | Disposition: A | Discharge status: Home / Self Care | Payer: 59 | Source: Ambulatory Visit | Attending: Obstetrics and Gynecology | Admitting: Obstetrics and Gynecology

## 2019-05-18 DIAGNOSIS — Z1231 Encounter for screening mammogram for malignant neoplasm of breast: Secondary | ICD-10-CM | POA: Insufficient documentation

## 2019-05-18 IMAGING — MG DIGITAL SCREENING BILAT W/ TOMO W/ CAD
8 series · 8 of 24 positions shown · non-contrast
Comparison: None.

CLINICAL DATA: Screening.

EXAM:
DIGITAL SCREENING BILATERAL MAMMOGRAM WITH TOMO AND CAD

[R MLO synth-2D]
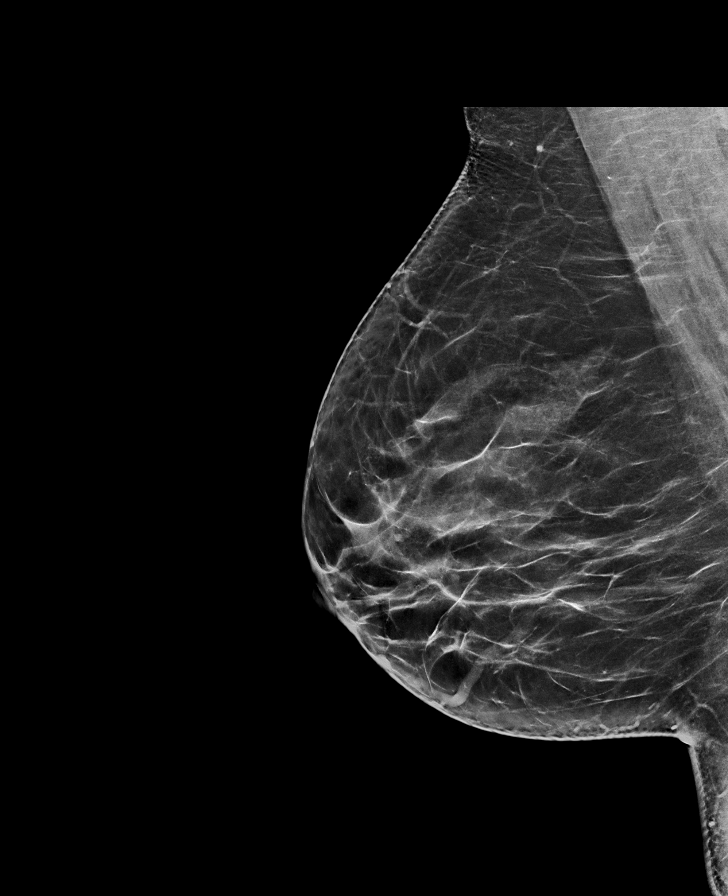

[R CC synth-2D]
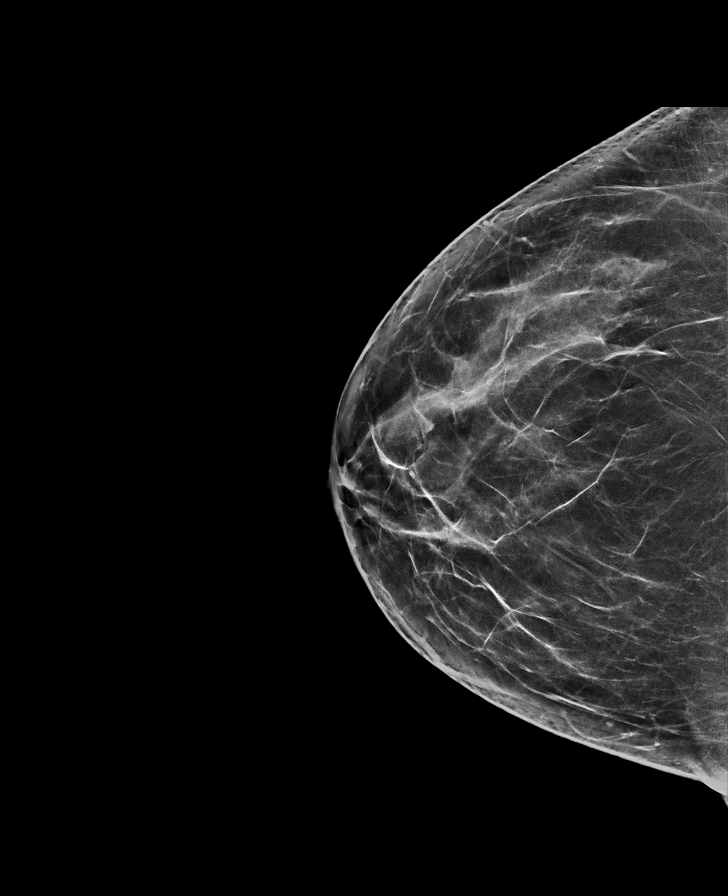

[L MLO synth-2D]
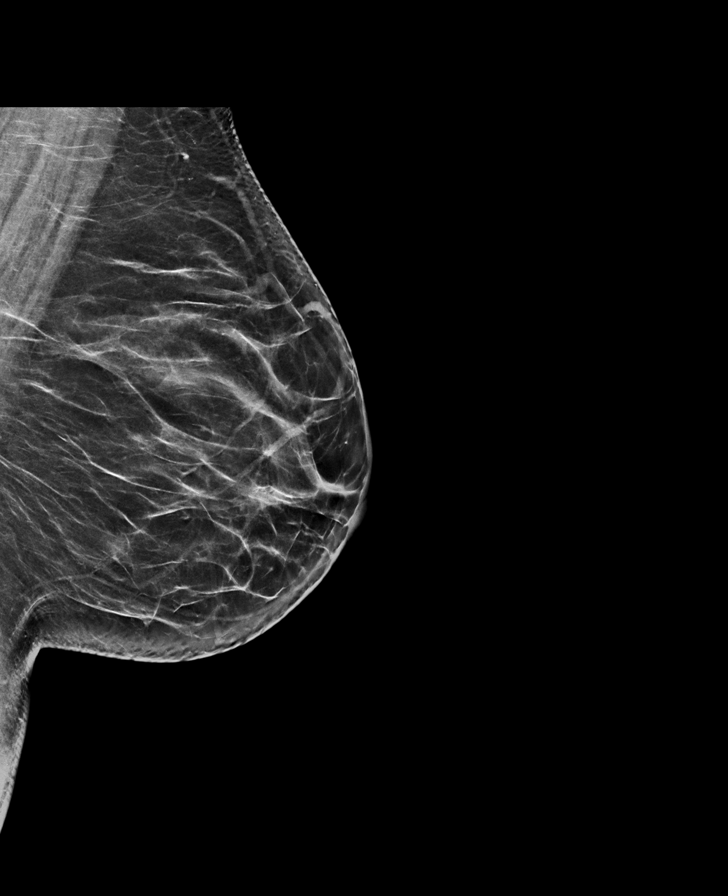

[L CC synth-2D]
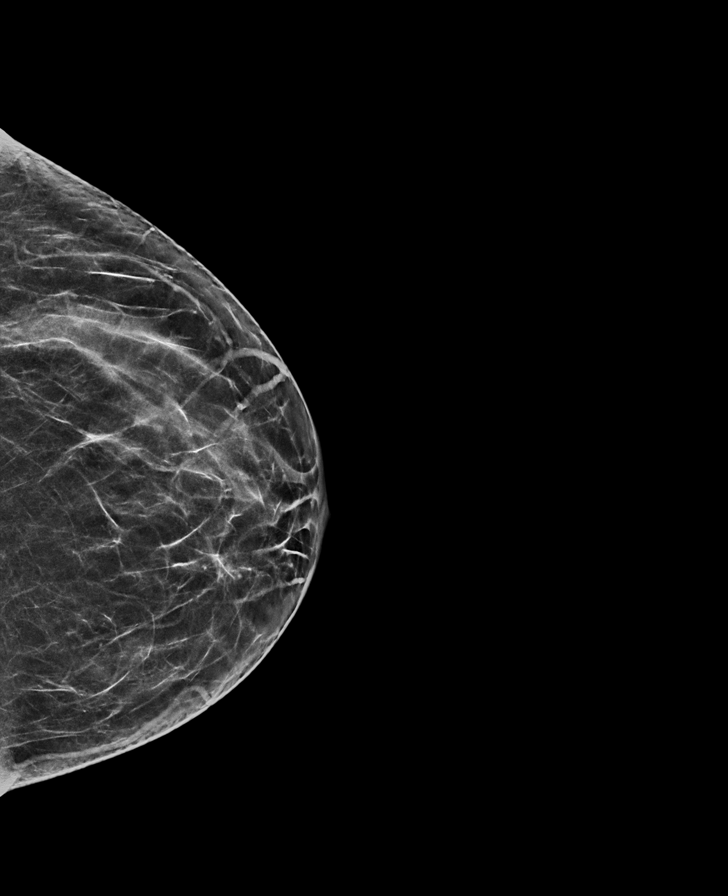

[L MLO tomo · tomo slice 35/70.0]
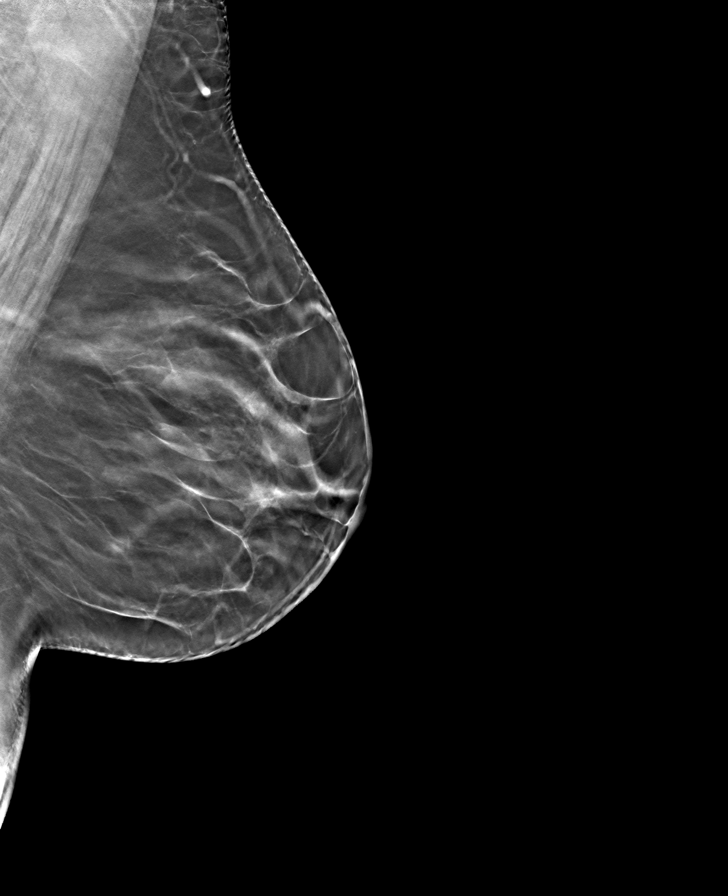

[R MLO tomo · tomo slice 40/79.0]
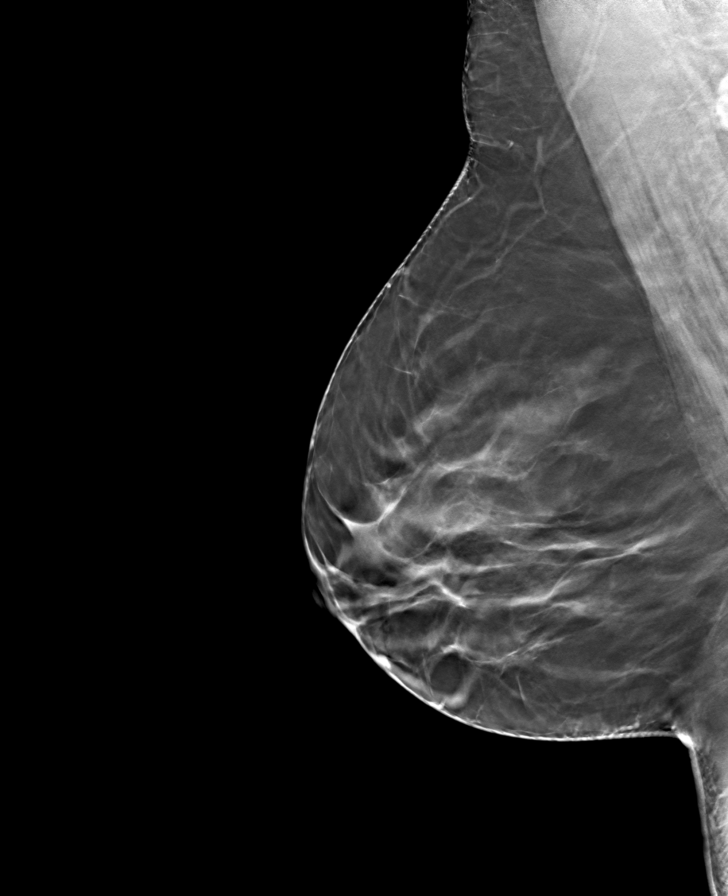

[L CC tomo · tomo slice 31/60.0]
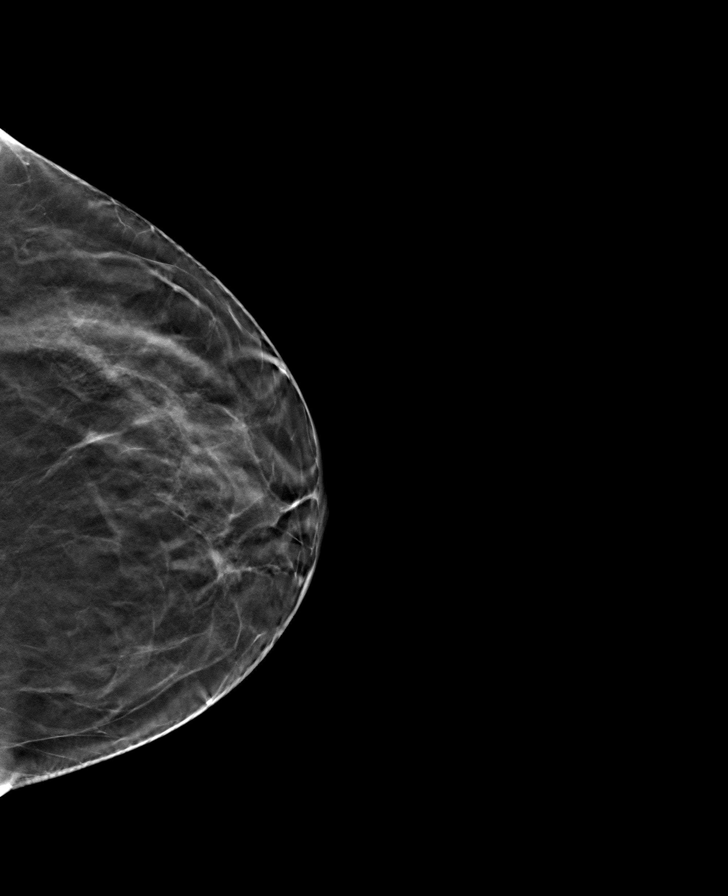

[R CC tomo · tomo slice 37/72.0]
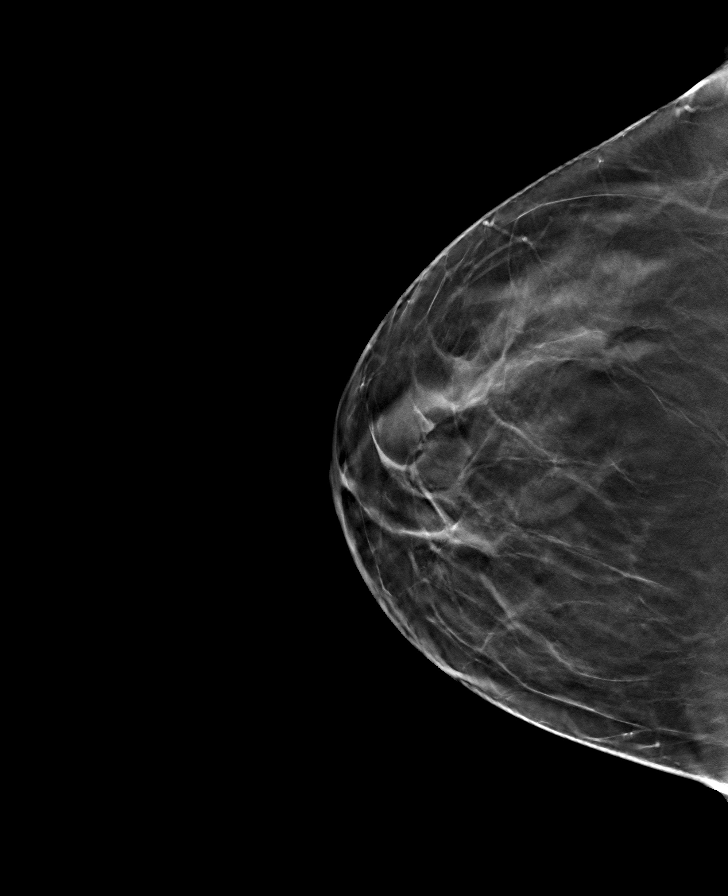

[8 of 24 positions shown; findings below may reference images not displayed]

ACR Breast Density Category b: There are scattered areas of
fibroglandular density.
FINDINGS: There are no findings suspicious for malignancy. Images were
processed with CAD.
IMPRESSION: No mammographic evidence of malignancy. A result letter of this
screening mammogram will be mailed directly to the patient.

RECOMMENDATION:
Screening mammogram at age 40. (Code:[NF])

BI-RADS CATEGORY  1: Negative.

## 2019-06-01 ENCOUNTER — Other Ambulatory Visit: Payer: Self-pay

## 2019-06-01 DIAGNOSIS — Z20822 Contact with and (suspected) exposure to covid-19: Secondary | ICD-10-CM

## 2019-06-03 LAB — NOVEL CORONAVIRUS, NAA: SARS-CoV-2, NAA: NOT DETECTED

## 2019-11-06 ENCOUNTER — Other Ambulatory Visit: Payer: Self-pay | Admitting: Obstetrics and Gynecology

## 2019-11-19 NOTE — H&P (Addendum)
Pt seen and examined. Proceed with TVH, anterior repair and culdoplasty.  Pt reports delayed bladder emptying with last surgery, requiring 2 weeks of foley cath. Will do voiding trial and replace cath if needed.  EMBx:NO HYPERPLASIA OR CARCINOMA.  RARE BENIGN ENDOMETRIAL FRAGMENTS ARE SEEN IN A SPECIMEN  THAT CONSISTS MAINLY OF MUCUS, BLOOD, SQUAMOUS EPITHELIUM,  AND ENDOCERVICAL GLANDS  Pap: neg            Chief Complaint:   [name] is a 39 y.o. female presenting with Pre Op Consulting  History of Present Illness: Patient is an established patient who return for a pre-operative visit regarding Total Vaginal Hysterectomy, Culdoplasty, and Anterior Repair scheduled for 12/04/2019. Indications include worsened dysmenorrhea, menorrhagia (chocolate-like blood with odor), ddx post tubal-ablation syndrome, also Grade III cystocele. She is already s/p ablation with BTL in 03/2018. She is also s/p anterior repair in 2018.  She will plan for pelvic floor physical therapy post-op.  Workup: Pap: collected today  EMBx: collected today  TVUS today: Uterus=7.52 x 3.89 x 4.76cm Uterus anteverted Endometrium=6.63mm Fibroids seen: 1)anterior=2cm 2)posterior=1.3cm No free fluid seen Lt ovary contains 2 cysts: 1)complex septated=1cm; septation=0.12cm 2)simple=1.5cm Rt ovary appears wnl  Past Medical History:  has a past medical history of Anemia, Anxiety (2017), Cystocele with prolapse, Depression (2017), Fracture of fifth toe, right, closed, Glomerulonephritis (1995), Hypertension, Kidney infection, and Migraine.  Past Surgical History:  has a past surgical history that includes Tonsillectomy; Cholecystectomy (11/2013); Endoscopic right carpal tunnel release (Right, 04/23/2018); laparoscopic salpingectomy; Hysteroscopy; Anterior and posterior vaginal repair; Dilation and curettage of uterus; Endometrial ablation (2019); Tubal ligation (Bilateral, 08/2016); and Hysteroscopy w/  endometrial ablation (2019). Family History: family history includes Alcohol abuse in her father and mother; Alzheimer's disease in her paternal grandfather; Breast cancer (age of onset: 56) in an other family member; Cervical cancer in her sister; Coronary Artery Disease (Blocked arteries around heart) (age of onset: 39) in her father; Depression in her father and mother; Diabetes type II in her mother; High blood pressure (Hypertension) in her father, maternal grandmother, mother, and paternal grandfather; Hip fracture in her maternal grandmother; Hyperlipidemia (Elevated cholesterol) in her father and mother; Irregular Heart Beat (Arrhythmia) in her sister; Kidney failure in her paternal grandfather; Lupus in her cousin and mother; No Known Problems in her son, son, and son; Osteoarthritis in her maternal grandmother, mother, and paternal grandmother; Rheum arthritis in an other family member; Thyroid disease in her mother. Social History:  reports that she quit smoking about 3 years ago. Her smoking use included cigarettes. She has a 4.00 pack-year smoking history. She has never used smokeless tobacco. She reports that she does not drink alcohol and does not use drugs. OB/GYN History:          OB History    Gravida  6   Para  4   Term  4   Preterm      AB  2   Living  4     SAB  1   TAB  1   Ectopic      Molar      Multiple      Live Births  4        Allergies: has No Known Allergies. Medications:  Current Outpatient Medications:  .  ALPRAZolam (XANAX) 0.25 MG tablet, TAKE 1 TABLET (0.25 MG TOTAL) BY MOUTH 3 (THREE) TIMES DAILY AS NEEDED FOR ANXIETY, Disp: 45 tablet, Rfl: 5 .  buPROPion (WELLBUTRIN XL) 150 MG  XL tablet, Take 1 tablet (150 mg total) by mouth once daily, Disp: 90 tablet, Rfl: 3 .  celecoxib (CELEBREX) 200 MG capsule, TAKE 1 CAPSULE (200 MG TOTAL) BY MOUTH ONCE DAILY AS NEEDED FOR PAIN FOR UP TO 30 DAYS, Disp: 30 capsule, Rfl: 11 .  cetirizine (ZYRTEC)  10 MG tablet, Take 10 mg by mouth once daily as needed, Disp: , Rfl:  .  CYANOCOBALAMIN, VITAMIN B-12, ORAL, Take by mouth 1000 mcg daily, Disp: , Rfl:  .  dapsone 7.5 % GlwP, APPLY A SMALL AMOUNT TO AFFECTED AREA ONCE A DAY, Disp: , Rfl:  .  diclofenac (VOLTAREN) 1 % topical gel, Apply 2 g topically 4 (four) times daily, Disp: 123XX123 g, Rfl: 3 .  folic acid (FOLVITE) 1 MG tablet, TAKE 1 TABLET (1 MG TOTAL) BY MOUTH ONCE DAILY., Disp: 90 tablet, Rfl: 3 .  HYDROcodone-acetaminophen (NORCO) 5-325 mg tablet, May take one  tablets every 12  hours, if more pain control is needed, thirty tablets, Disp: 30 tablet, Rfl: 0 .  methotrexate (RHEUMATREX) 2.5 MG tablet, TAKE 8 TABS (20 MG ) ONCE A WEEK, Disp: 96 tablet, Rfl: 4 .  omeprazole (PRILOSEC) 40 MG DR capsule, Take 40 mg by mouth once daily, Disp: , Rfl:  .  pregabalin (LYRICA) 25 MG capsule, TAKE 1 CAPSULE (25 MG TOTAL) BY MOUTH 2 (TWO) TIMES DAILY, Disp: 60 capsule, Rfl: 5 .  sertraline (ZOLOFT) 100 MG tablet, TAKE 1 AND ONE-HALF TABLETS (150 MG TOTAL) BY MOUTH ONCE DAILY, Disp: 135 tablet, Rfl: 1 .  solifenacin (VESICARE) 5 MG tablet, Take 5 mg by mouth once daily, Disp: , Rfl:  .  triamterene-hydrochlorothiazide (MAXZIDE-25) 37.5-25 mg tablet, TAKE 1 TABLET BY MOUTH EVERY DAY, Disp: 90 tablet, Rfl: 3 .  VITAMIN D3 ORAL, Take by mouth 2000 units daily, Disp: , Rfl:    Exam:   BP 124/76   Ht 162.6 cm (5\' 4" )   Wt 82.6 kg (182 lb)   LMP 10/27/2019 (Exact Date)   BMI 31.24 kg/m   General: Patient is well-groomed, well-nourished, appears stated age in no acute distress  HEENT: head is atraumatic and normocephalic, trachea is midline, neck is supple with no palpable nodules  CV: Regular rhythm and normal heart rate, no murmur  Pulm: Clear to auscultation throughout lung fields with no wheezing, crackles, or rhonchi. No increased work of breathing  Abdomen: soft , no mass, non-tender, no rebound tenderness, no hepatomegaly  Pelvic:  tanner stage 5 ,              External genitalia: vulva /labia no lesions, grade 3 cystocele, minimal apical prolapse             Urethra: no prolapse             Vagina: normal physiologic d/c, laxity in vaginal walls             Cervix: no lesions, no cervical motion tenderness, good descent             Uterus: normal size shape and contour, non-tender             Adnexa: no mass,  non-tender               Rectovaginal: External wnl, small left skin tag appearance of acrochordon   Endometrial biopsy: The cervix was cleaned with betadine, topical Hurriciane spray applied, and a single tooth tenaculum is applied to the anterior cervix. Cervical dilation required d/t cervical  stenosis but also likely d/t endometrial ablation scarring. The Pipelle catheter was placed into the endometrial cavity. It sounds to 6 cm and adequate tissue was removed.  Impression:   The primary encounter diagnosis was Pre-op testing. Diagnoses of Screening for cervical cancer, Excessive and frequent menstruation, Pelvic pain in female, Female cystocele, Cervical stenosis (uterine cervix), S/P endometrial ablation, Encounter for cervical Pap smear with pelvic exam, and Abnormal uterine bleeding (AUB) were also pertinent to this visit.  Plan:   1. Preoperative Clearance, work up - sign consents  -Pap smear collected today -Endometrial biopsy collected today  -F/u with results   Patient returns for a preoperative discussion regarding her plans to proceed with definitive surgical treatment of her menorrhagia, worsened dysmenorrhea, ddx post tubal-ablation syndrome by total vaginal hysterectomy with bilateral salpingectomy, culdoplasty, and anterior repair.I will also remove her left gluteal skin tag at her request today.  The patient and I discussed the technical aspects of the procedure including the potential for risks and complications. These include but are not limited to the risk of infection requiring  post-operative antibiotics or further procedures. We talked about the risk of injury to adjacent organs including bladder, bowel, ureter, blood vessels or nerves. We talked about the need to convert to a laparoscopy or an open incision. We talked about the possible need for blood transfusion. We talked aboutpostop complications such asthromboembolic or cardiopulmonary complications. All of her questions were answered.  Her preoperative exam was completed and the appropriate consents were signed. She is scheduled to undergo this procedure in the near future.   Diagnoses and all orders for this visit:  Pre-op testing -     IGP, Aptima HPV - [facility] -     Pathology Report - [facility]  Screening for cervical cancer -     IGP, Aptima HPV - [facility] -     Pathology Report - [facility]  Excessive and frequent menstruation -     IGP, Aptima HPV - [facility] -     Pathology Report - [facility]  Pelvic pain in female -     Pathology Report - [facility]  Female cystocele  Cervical stenosis (uterine cervix)  S/P endometrial ablation  Encounter for cervical Pap smear with pelvic exam  Abnormal uterine bleeding (AUB)   Return for Postop check.  ~~~~~~~~~~~~~~~~~~~~~~~~~~~~~~~~~~~~~~~~~~~~~~~~~~~~~~~~~~~~ This note is partially written by Priscella Mann, in the presence of and acting as the scribe of Dr. Benjaman Kindler, who has reviewed, edited and added to the note to reflect her best personal medical judgment.  This note was generated in part with voice recognition software and I apologize for any typographical errors that were not detected and corrected.  Sherrie George, MD

## 2019-11-23 ENCOUNTER — Encounter
Admission: RE | Admit: 2019-11-23 | Discharge: 2019-11-23 | Disposition: A | Discharge status: Home / Self Care | Payer: 59 | Source: Ambulatory Visit | Attending: Obstetrics and Gynecology | Admitting: Obstetrics and Gynecology

## 2019-11-23 ENCOUNTER — Other Ambulatory Visit: Payer: Self-pay

## 2019-11-23 ENCOUNTER — Encounter: Payer: Self-pay | Admitting: *Deleted

## 2019-11-23 HISTORY — DX: Gastro-esophageal reflux disease without esophagitis: K21.9

## 2019-11-23 HISTORY — DX: Personal history of urinary calculi: Z87.442

## 2019-11-23 NOTE — Patient Instructions (Addendum)
Your procedure is scheduled on: 12-04-19 FRIDAY Report to Same Day Surgery 2nd floor medical mall [facility] Entrance-take elevator on left to 2nd floor.  Check in with surgery information desk.) To find out your arrival time please call [phone] between 1PM - 3PM on 12-03-19 THURSDAY  Remember: Instructions that are not followed completely may result in serious medical risk, up to and including death, or upon the discretion of your surgeon and anesthesiologist your surgery may need to be rescheduled.    _x___ 1. Do not eat food after midnight the night before your procedure. NO GUM OR CANDY AFTER MIDNIGHT. You may drink clear liquids up to 2 hours before you are scheduled to arrive at the hospital for your procedure.  Do not drink clear liquids within 2 hours of your scheduled arrival to the hospital.  Clear liquids include  --Water or Apple juice without pulp  --Gatorade  --Black Coffee or Clear Tea (No milk, no creamers, do not add anything to the coffee or Tea-ok to add sugar)   ____Ensure clear carbohydrate drink on the way to the hospital for bariatric patients  _X___Ensure clear carbohydrate drink-FINISH DRINK 2 HOURS PRIOR TO ARRIVAL [place]    __x__ 2. No Alcohol for 24 hours before or after surgery.   __x__3. No Smoking or e-cigarettes for 24 prior to surgery.  Do not use any chewable tobacco products for at least 6 hour prior to surgery   ____  4. Bring all medications with you on the day of surgery if instructed.    __x__ 5. Notify your doctor if there is any change in your medical condition     (cold, fever, infections).    x___6. On the morning of surgery brush your teeth with toothpaste and water.  You may rinse your mouth with mouth wash if you wish.  Do not swallow any toothpaste or mouthwash.   Do not wear jewelry, make-up, hairpins, clips or nail polish.  Do not wear lotions, powders, or perfumes. You may wear deodorant.  Do not shave  48 hours prior to surgery. Men may shave face and neck.  Do not bring valuables to the hospital.    [facility] is not responsible for any belongings or valuables.               Contacts, dentures or bridgework may not be worn into surgery.  Leave your suitcase in the car. After surgery it may be brought to your room.  For patients admitted to the hospital, discharge time is determined by your treatment team.  _  Patients discharged the day of surgery will not be allowed to drive home.  You will need someone to drive you home and stay with you the night of your procedure.    Please read over the following fact sheets that you were given:   [facility] Preparing for Surgery/INCENTIVE SPIROMETER INSTRUCTIONS  _x___ TAKE THE FOLLOWING MEDICATION THE MORNING OF SURGERY WITH SMALL SIP OF WATER. These include:  1. WELLBUTRIN (BUPROPION)  2. LYRICA (PREGABALIN)  3. ZOLOFT (SERTRALINE)  4. VESICARE (SOLIFENACIN)  5. PRILOSEC (OMEPRAZOLE)  6. TAKE AN EXTRA PRILOSEC THE NIGHT BEFORE YOUR SURGERY  ____Fleets enema or Magnesium Citrate as directed.   _x___ Use CHG Soap or sage wipes as directed on instruction sheet   ____ Use inhalers on the day of surgery and bring to hospital day of surgery  ____ Stop Metformin and Janumet 2 days prior to surgery.  ____ Take 1/2 of usual insulin dose the night before surgery and none on the morning surgery.   ____ Follow recommendations from Cardiologist, Pulmonologist or PCP regarding stopping Aspirin, Coumadin, Plavix ,Eliquis, Effient, or Pradaxa, and Pletal.  X____Stop Anti-inflammatories such as Advil, Aleve, Ibuprofen, Motrin, Naproxen, Naprosyn, Goodies powders or aspirin products 7 DAYS PRIOR TO SURGERY-OK to take Tylenol. OK TO CONTINUE YOUR CELEBREX. DO NOT TAKE CELEBREX AM OF SURGERY   ____ Stop supplements until after surgery   ____ Bring C-Pap to the hospital.

## 2019-11-24 ENCOUNTER — Encounter
Admission: RE | Admit: 2019-11-24 | Discharge: 2019-11-24 | Disposition: A | Discharge status: Home / Self Care | Payer: 59 | Source: Ambulatory Visit | Attending: Obstetrics and Gynecology | Admitting: Obstetrics and Gynecology

## 2019-11-24 DIAGNOSIS — Z01818 Encounter for other preprocedural examination: Secondary | ICD-10-CM | POA: Diagnosis present

## 2019-11-24 DIAGNOSIS — I10 Essential (primary) hypertension: Secondary | ICD-10-CM | POA: Diagnosis not present

## 2019-11-24 LAB — CBC
HCT: 38.1 % (ref 36.0–46.0)
Hemoglobin: 13.2 g/dL (ref 12.0–15.0)
MCH: 30.6 pg (ref 26.0–34.0)
MCHC: 34.6 g/dL (ref 30.0–36.0)
MCV: 88.4 fL (ref 80.0–100.0)
Platelets: 242 10*3/uL (ref 150–400)
RBC: 4.31 MIL/uL (ref 3.87–5.11)
RDW: 13.2 % (ref 11.5–15.5)
WBC: 7.5 10*3/uL (ref 4.0–10.5)
nRBC: 0 % (ref 0.0–0.2)

## 2019-11-24 LAB — BASIC METABOLIC PANEL
Anion gap: 8 (ref 5–15)
BUN: 20 mg/dL (ref 6–20)
CO2: 26 mmol/L (ref 22–32)
Calcium: 9.3 mg/dL (ref 8.9–10.3)
Chloride: 105 mmol/L (ref 98–111)
Creatinine, Ser: 0.75 mg/dL (ref 0.44–1.00)
GFR calc Af Amer: >60 mL/min (ref >60)
GFR calc non Af Amer: >60 mL/min (ref >60)
Glucose, Bld: 96 mg/dL (ref 70–99)
Potassium: 3.3 mmol/L — ABNORMAL LOW (ref 3.5–5.1)
Sodium: 139 mmol/L (ref 135–145)

## 2019-11-24 LAB — TYPE AND SCREEN
ABO/RH(D): AB POS
Antibody Screen: NEGATIVE

## 2019-11-24 NOTE — Pre-Procedure Instructions (Signed)
Pre-Admit Testing Provider Notification Note  Provider Notified: Dr. Leafy Ro  Notification Mode: Fax  Reason: Abnormal lab result.  Response: Fax confirmation received.   Additional Information: Placed on chart. Noted on Pre-Admit Worksheet.  Signed: Beulah Gandy, RN

## 2019-12-02 ENCOUNTER — Other Ambulatory Visit
Admission: RE | Admit: 2019-12-02 | Discharge: 2019-12-02 | Disposition: A | Discharge status: Home / Self Care | Payer: 59 | Source: Ambulatory Visit | Attending: Obstetrics and Gynecology | Admitting: Obstetrics and Gynecology

## 2019-12-02 ENCOUNTER — Other Ambulatory Visit: Payer: Self-pay

## 2019-12-02 DIAGNOSIS — Z20822 Contact with and (suspected) exposure to covid-19: Secondary | ICD-10-CM | POA: Insufficient documentation

## 2019-12-02 DIAGNOSIS — Z01812 Encounter for preprocedural laboratory examination: Secondary | ICD-10-CM | POA: Insufficient documentation

## 2019-12-02 LAB — SARS CORONAVIRUS 2 (TAT 6-24 HRS): SARS Coronavirus 2: NEGATIVE

## 2019-12-04 ENCOUNTER — Ambulatory Visit: Payer: 59 | Admitting: Anesthesiology

## 2019-12-04 ENCOUNTER — Other Ambulatory Visit: Payer: Self-pay

## 2019-12-04 ENCOUNTER — Encounter
Admission: RE | Disposition: A | Discharge status: Home / Self Care | Payer: Self-pay | Source: Home / Self Care | Attending: Obstetrics and Gynecology

## 2019-12-04 ENCOUNTER — Ambulatory Visit
Admission: RE | Admit: 2019-12-04 | Discharge: 2019-12-04 | Disposition: A | Discharge status: Home / Self Care | Payer: 59 | Attending: Obstetrics and Gynecology | Admitting: Obstetrics and Gynecology

## 2019-12-04 ENCOUNTER — Encounter: Payer: Self-pay | Admitting: Obstetrics and Gynecology

## 2019-12-04 DIAGNOSIS — R102 Pelvic and perineal pain: Secondary | ICD-10-CM | POA: Diagnosis not present

## 2019-12-04 DIAGNOSIS — F329 Major depressive disorder, single episode, unspecified: Secondary | ICD-10-CM | POA: Diagnosis not present

## 2019-12-04 DIAGNOSIS — N8189 Other female genital prolapse: Secondary | ICD-10-CM | POA: Insufficient documentation

## 2019-12-04 DIAGNOSIS — N946 Dysmenorrhea, unspecified: Secondary | ICD-10-CM | POA: Diagnosis not present

## 2019-12-04 DIAGNOSIS — F419 Anxiety disorder, unspecified: Secondary | ICD-10-CM | POA: Insufficient documentation

## 2019-12-04 DIAGNOSIS — N8110 Cystocele, unspecified: Secondary | ICD-10-CM | POA: Diagnosis not present

## 2019-12-04 DIAGNOSIS — I10 Essential (primary) hypertension: Secondary | ICD-10-CM | POA: Insufficient documentation

## 2019-12-04 DIAGNOSIS — N920 Excessive and frequent menstruation with regular cycle: Secondary | ICD-10-CM | POA: Insufficient documentation

## 2019-12-04 DIAGNOSIS — N800 Endometriosis of uterus: Secondary | ICD-10-CM | POA: Diagnosis not present

## 2019-12-04 DIAGNOSIS — Z79899 Other long term (current) drug therapy: Secondary | ICD-10-CM | POA: Insufficient documentation

## 2019-12-04 DIAGNOSIS — D2272 Melanocytic nevi of left lower limb, including hip: Secondary | ICD-10-CM | POA: Diagnosis not present

## 2019-12-04 HISTORY — PX: VAGINAL HYSTERECTOMY: SHX2639

## 2019-12-04 HISTORY — PX: CYSTOCELE REPAIR: SHX163

## 2019-12-04 LAB — POCT PREGNANCY, URINE: Preg Test, Ur: NEGATIVE

## 2019-12-04 LAB — ABO/RH: ABO/RH(D): AB POS

## 2019-12-04 SURGERY — HYSTERECTOMY, VAGINAL
Anesthesia: General

## 2019-12-04 MED ORDER — OXYCODONE HCL 5 MG PO TABS: 5.0000 mg | ORAL_TABLET | ORAL | 0 refills | Status: AC | PRN

## 2019-12-04 MED ORDER — PROPOFOL 500 MG/50ML IV EMUL
INTRAVENOUS | Status: AC
  Filled 2019-12-04: qty 50

## 2019-12-04 MED ORDER — ONDANSETRON HCL 4 MG/2ML IJ SOLN
INTRAMUSCULAR | Status: DC | PRN
  Administered 2019-12-04: 4 mg via INTRAVENOUS

## 2019-12-04 MED ORDER — PROMETHAZINE HCL 25 MG/ML IJ SOLN
INTRAMUSCULAR | Status: AC
  Administered 2019-12-04: 6.25 mg via INTRAVENOUS
  Filled 2019-12-04: qty 1

## 2019-12-04 MED ORDER — PROPOFOL 10 MG/ML IV BOLUS
INTRAVENOUS | Status: DC | PRN
  Administered 2019-12-04: 170 mg via INTRAVENOUS

## 2019-12-04 MED ORDER — CHLORHEXIDINE GLUCONATE 0.12 % MT SOLN: 15.0000 mL | Freq: Once | OROMUCOSAL | Status: AC

## 2019-12-04 MED ORDER — ONDANSETRON HCL 4 MG/2ML IJ SOLN
INTRAMUSCULAR | Status: AC
  Filled 2019-12-04: qty 2

## 2019-12-04 MED ORDER — FENTANYL CITRATE (PF) 100 MCG/2ML IJ SOLN
INTRAMUSCULAR | Status: AC
  Filled 2019-12-04: qty 2

## 2019-12-04 MED ORDER — ROCURONIUM BROMIDE 10 MG/ML (PF) SYRINGE
PREFILLED_SYRINGE | INTRAVENOUS | Status: AC
  Filled 2019-12-04: qty 10

## 2019-12-04 MED ORDER — LIDOCAINE HCL (CARDIAC) PF 100 MG/5ML IV SOSY
PREFILLED_SYRINGE | INTRAVENOUS | Status: DC | PRN
  Administered 2019-12-04: 100 mg via INTRAVENOUS

## 2019-12-04 MED ORDER — FENTANYL CITRATE (PF) 100 MCG/2ML IJ SOLN
25.0000 ug | INTRAMUSCULAR | Status: DC | PRN
  Administered 2019-12-04 (×3): 25 ug via INTRAVENOUS

## 2019-12-04 MED ORDER — KETOROLAC TROMETHAMINE 30 MG/ML IJ SOLN
INTRAMUSCULAR | Status: DC | PRN
  Administered 2019-12-04: 30 mg via INTRAVENOUS

## 2019-12-04 MED ORDER — LACTATED RINGERS IV SOLN: INTRAVENOUS | Status: DC

## 2019-12-04 MED ORDER — OXYCODONE HCL 5 MG PO TABS
5.0000 mg | ORAL_TABLET | Freq: Once | ORAL | Status: AC | PRN
  Administered 2019-12-04: 5 mg via ORAL

## 2019-12-04 MED ORDER — PROPOFOL 10 MG/ML IV BOLUS
INTRAVENOUS | Status: AC
  Filled 2019-12-04: qty 20

## 2019-12-04 MED ORDER — EPHEDRINE 5 MG/ML INJ
INTRAVENOUS | Status: AC
  Filled 2019-12-04: qty 10

## 2019-12-04 MED ORDER — OXYCODONE HCL 5 MG/5ML PO SOLN: 5.0000 mg | Freq: Once | ORAL | Status: AC | PRN

## 2019-12-04 MED ORDER — ORAL CARE MOUTH RINSE: 15.0000 mL | Freq: Once | OROMUCOSAL | Status: AC

## 2019-12-04 MED ORDER — SCOPOLAMINE 1 MG/3DAYS TD PT72
MEDICATED_PATCH | TRANSDERMAL | Status: AC
  Administered 2019-12-04: 1.5 mg via TRANSDERMAL
  Filled 2019-12-04: qty 1

## 2019-12-04 MED ORDER — GABAPENTIN 300 MG PO CAPS: 300.0000 mg | ORAL_CAPSULE | ORAL | Status: AC

## 2019-12-04 MED ORDER — DEXAMETHASONE SODIUM PHOSPHATE 10 MG/ML IJ SOLN
INTRAMUSCULAR | Status: DC | PRN
  Administered 2019-12-04: 10 mg via INTRAVENOUS

## 2019-12-04 MED ORDER — LIDOCAINE HCL (PF) 2 % IJ SOLN
INTRAMUSCULAR | Status: DC | PRN
Start: 2019-12-04 — End: 2019-12-04
  Administered 2019-12-04: 1.5 mg/kg/h via INTRADERMAL

## 2019-12-04 MED ORDER — CHLORHEXIDINE GLUCONATE 0.12 % MT SOLN
OROMUCOSAL | Status: AC
  Administered 2019-12-04: 15 mL via OROMUCOSAL
  Filled 2019-12-04: qty 15

## 2019-12-04 MED ORDER — OXYCODONE HCL 5 MG PO TABS
ORAL_TABLET | ORAL | Status: AC
  Filled 2019-12-04: qty 2

## 2019-12-04 MED ORDER — ROCURONIUM BROMIDE 100 MG/10ML IV SOLN: INTRAVENOUS | Status: DC | PRN

## 2019-12-04 MED ORDER — OXYCODONE HCL 5 MG PO TABS
ORAL_TABLET | ORAL | Status: AC
  Administered 2019-12-04: 10 mg via ORAL
  Filled 2019-12-04: qty 1

## 2019-12-04 MED ORDER — FENTANYL CITRATE (PF) 100 MCG/2ML IJ SOLN
INTRAMUSCULAR | Status: DC | PRN
  Administered 2019-12-04 (×4): 50 ug via INTRAVENOUS

## 2019-12-04 MED ORDER — LACTATED RINGERS IV SOLN: INTRAVENOUS | Status: DC | PRN

## 2019-12-04 MED ORDER — SUCCINYLCHOLINE CHLORIDE 200 MG/10ML IV SOSY
PREFILLED_SYRINGE | INTRAVENOUS | Status: AC
  Filled 2019-12-04: qty 10

## 2019-12-04 MED ORDER — ACETAMINOPHEN 500 MG PO TABS
ORAL_TABLET | ORAL | Status: AC
  Filled 2019-12-04: qty 1

## 2019-12-04 MED ORDER — LIDOCAINE-EPINEPHRINE 1 %-1:100000 IJ SOLN
INTRAMUSCULAR | Status: AC
  Filled 2019-12-04: qty 1

## 2019-12-04 MED ORDER — LIDOCAINE HCL (PF) 2 % IJ SOLN
INTRAMUSCULAR | Status: AC
  Filled 2019-12-04: qty 20

## 2019-12-04 MED ORDER — IBUPROFEN 800 MG PO TABS: 800.0000 mg | ORAL_TABLET | Freq: Three times a day (TID) | ORAL | 1 refills | Status: AC

## 2019-12-04 MED ORDER — SUGAMMADEX SODIUM 200 MG/2ML IV SOLN
INTRAVENOUS | Status: DC | PRN
  Administered 2019-12-04: 400 mg via INTRAVENOUS

## 2019-12-04 MED ORDER — ACETAMINOPHEN 500 MG PO TABS
ORAL_TABLET | ORAL | Status: AC
  Administered 2019-12-04: 1000 mg via ORAL
  Filled 2019-12-04: qty 1

## 2019-12-04 MED ORDER — ACETAMINOPHEN 500 MG PO TABS
1000.0000 mg | ORAL_TABLET | Freq: Four times a day (QID) | ORAL | 0 refills | Status: AC
Start: 2019-12-04 — End: 2019-12-07

## 2019-12-04 MED ORDER — PROMETHAZINE HCL 25 MG/ML IJ SOLN: 6.2500 mg | INTRAMUSCULAR | Status: DC | PRN

## 2019-12-04 MED ORDER — LIDOCAINE-EPINEPHRINE 1 %-1:100000 IJ SOLN
INTRAMUSCULAR | Status: AC
  Filled 2019-12-04: qty 2

## 2019-12-04 MED ORDER — DEXMEDETOMIDINE HCL 200 MCG/2ML IV SOLN
INTRAVENOUS | Status: DC | PRN
  Administered 2019-12-04 (×2): 16 ug via INTRAVENOUS
  Administered 2019-12-04: 4 ug via INTRAVENOUS

## 2019-12-04 MED ORDER — PROPOFOL 500 MG/50ML IV EMUL
INTRAVENOUS | Status: DC | PRN
Start: 2019-12-04 — End: 2019-12-04
  Administered 2019-12-04: 150 ug/kg/min via INTRAVENOUS

## 2019-12-04 MED ORDER — DEXAMETHASONE SODIUM PHOSPHATE 10 MG/ML IJ SOLN
INTRAMUSCULAR | Status: AC
  Filled 2019-12-04: qty 1

## 2019-12-04 MED ORDER — OXYCODONE HCL 5 MG PO TABS: 10.0000 mg | ORAL_TABLET | Freq: Once | ORAL | Status: AC

## 2019-12-04 MED ORDER — ROCURONIUM BROMIDE 100 MG/10ML IV SOLN
INTRAVENOUS | Status: DC | PRN
  Administered 2019-12-04 (×2): 20 mg via INTRAVENOUS
  Administered 2019-12-04: 50 mg via INTRAVENOUS
  Administered 2019-12-04: 20 mg via INTRAVENOUS

## 2019-12-04 MED ORDER — FENTANYL CITRATE (PF) 100 MCG/2ML IJ SOLN
INTRAMUSCULAR | Status: AC
  Administered 2019-12-04: 25 ug via INTRAVENOUS
  Filled 2019-12-04: qty 2

## 2019-12-04 MED ORDER — SCOPOLAMINE 1 MG/3DAYS TD PT72: 1.0000 | MEDICATED_PATCH | TRANSDERMAL | Status: DC

## 2019-12-04 MED ORDER — MIDAZOLAM HCL 2 MG/2ML IJ SOLN
INTRAMUSCULAR | Status: AC
  Filled 2019-12-04: qty 2

## 2019-12-04 MED ORDER — CEFAZOLIN SODIUM-DEXTROSE 2-4 GM/100ML-% IV SOLN
2.0000 g | INTRAVENOUS | Status: AC
  Administered 2019-12-04: 2 g via INTRAVENOUS

## 2019-12-04 MED ORDER — LIDOCAINE-EPINEPHRINE 1 %-1:100000 IJ SOLN
INTRAMUSCULAR | Status: DC | PRN
  Administered 2019-12-04: 24 mL

## 2019-12-04 MED ORDER — DOCUSATE SODIUM 100 MG PO CAPS
100.0000 mg | ORAL_CAPSULE | Freq: Two times a day (BID) | ORAL | 0 refills | Status: AC
Start: 2019-12-04 — End: ?

## 2019-12-04 MED ORDER — MIDAZOLAM HCL 2 MG/2ML IJ SOLN
INTRAMUSCULAR | Status: DC | PRN
  Administered 2019-12-04: 2 mg via INTRAVENOUS

## 2019-12-04 MED ORDER — CEFAZOLIN SODIUM-DEXTROSE 2-4 GM/100ML-% IV SOLN
INTRAVENOUS | Status: AC
  Filled 2019-12-04: qty 100

## 2019-12-04 MED ORDER — GABAPENTIN 300 MG PO CAPS
ORAL_CAPSULE | ORAL | Status: AC
  Administered 2019-12-04: 300 mg via ORAL
  Filled 2019-12-04: qty 1

## 2019-12-04 MED ORDER — ACETAMINOPHEN 500 MG PO TABS: 1000.0000 mg | ORAL_TABLET | ORAL | Status: AC

## 2019-12-04 SURGICAL SUPPLY — 42 items
BAG URINE DRAIN 2000ML AR STRL (UROLOGICAL SUPPLIES) ×2 IMPLANT
CANISTER SUCT 1200ML W/VALVE (MISCELLANEOUS) ×2 IMPLANT
CATH FOLEY 2WAY  5CC 16FR (CATHETERS) ×2
CATH ROBINSON RED A/P 16FR (CATHETERS) ×2 IMPLANT
CATH URTH 16FR FL 2W BLN LF (CATHETERS) ×1 IMPLANT
COVER WAND RF STERILE (DRAPES) ×2 IMPLANT
DERMABOND ADVANCED (GAUZE/BANDAGES/DRESSINGS) ×1
DERMABOND ADVANCED .7 DNX12 (GAUZE/BANDAGES/DRESSINGS) IMPLANT
DRAPE PERI LITHO V/GYN (MISCELLANEOUS) ×2 IMPLANT
DRAPE SURG 17X11 SM STRL (DRAPES) ×2 IMPLANT
DRAPE UNDER BUTTOCK W/FLU (DRAPES) ×2 IMPLANT
ELECT REM PT RETURN 9FT ADLT (ELECTROSURGICAL) ×2
ELECTRODE REM PT RTRN 9FT ADLT (ELECTROSURGICAL) ×1 IMPLANT
ETHIBOND 2 0 GREEN CT 2 30IN (SUTURE) ×4 IMPLANT
GAUZE 4X4 16PLY RFD (DISPOSABLE) ×2 IMPLANT
GAUZE PACK 2X3YD (GAUZE/BANDAGES/DRESSINGS) ×2 IMPLANT
GLOVE BIO SURGEON STRL SZ7 (GLOVE) ×2 IMPLANT
GLOVE INDICATOR 7.5 STRL GRN (GLOVE) ×2 IMPLANT
GOWN STRL REUS W/ TWL LRG LVL3 (GOWN DISPOSABLE) ×3 IMPLANT
GOWN STRL REUS W/ TWL XL LVL3 (GOWN DISPOSABLE) ×1 IMPLANT
GOWN STRL REUS W/TWL LRG LVL3 (GOWN DISPOSABLE) ×6
GOWN STRL REUS W/TWL XL LVL3 (GOWN DISPOSABLE) ×2
KIT TURNOVER CYSTO (KITS) ×2 IMPLANT
LABEL OR SOLS (LABEL) ×2 IMPLANT
NEEDLE HYPO 22GX1.5 SAFETY (NEEDLE) ×2 IMPLANT
NS IRRIG 500ML POUR BTL (IV SOLUTION) ×2 IMPLANT
PACK BASIN MINOR (MISCELLANEOUS) ×2 IMPLANT
PAD OB MATERNITY 4.3X12.25 (PERSONAL CARE ITEMS) ×2 IMPLANT
PAD PREP 24X41 OB/GYN DISP (PERSONAL CARE ITEMS) ×2 IMPLANT
SUT PDS 2-0 27IN (SUTURE) ×2 IMPLANT
SUT PDS AB 2-0 CT1 27 (SUTURE) ×2 IMPLANT
SUT VIC AB 0 CT1 27 (SUTURE) ×4
SUT VIC AB 0 CT1 27XCR 8 STRN (SUTURE) ×3 IMPLANT
SUT VIC AB 0 CT1 36 (SUTURE) ×4 IMPLANT
SUT VIC AB 2-0 CT1 36 (SUTURE) ×3 IMPLANT
SUT VIC AB 2-0 SH 27 (SUTURE) ×10
SUT VIC AB 2-0 SH 27XBRD (SUTURE) ×3 IMPLANT
SUT VIC AB 3-0 SH 27 (SUTURE) ×2
SUT VIC AB 3-0 SH 27X BRD (SUTURE) ×1 IMPLANT
SYR 10ML LL (SYRINGE) ×2 IMPLANT
SYR CONTROL 10ML LL (SYRINGE) ×2 IMPLANT
WATER STERILE IRR 1000ML POUR (IV SOLUTION) ×2 IMPLANT

## 2019-12-04 NOTE — Anesthesia Procedure Notes (Signed)
Procedure Name: Intubation Performed by: Justus Memory, CRNA Pre-anesthesia Checklist: Patient identified, Patient being monitored, Timeout performed, Emergency Drugs available and Suction available Patient Re-evaluated:Patient Re-evaluated prior to induction Oxygen Delivery Method: Circle system utilized Preoxygenation: Pre-oxygenation with 100% oxygen Induction Type: IV induction Ventilation: Mask ventilation without difficulty Laryngoscope Size: 3 and McGraph Grade View: Grade I Tube type: Oral Tube size: 7.0 mm Number of attempts: 1 Airway Equipment and Method: Stylet and Video-laryngoscopy Placement Confirmation: ETT inserted through vocal cords under direct vision,  positive ETCO2 and breath sounds checked- equal and bilateral Secured at: 21 cm Tube secured with: Tape Dental Injury: Teeth and Oropharynx as per pre-operative assessment

## 2019-12-04 NOTE — Discharge Instructions (Signed)
Discharge instructions after  vaginal hysterectomy  For the next three days, take ibuprofen and acetaminophen on a schedule, every 8 hours. You can take them together or you can intersperse them, and take one every four hours. Ibuprofen and Celebrex are the same medication, so don't take both - choose one.  I also usually give Lyrica to help with pain, but you are already on it, so continue what you already do. You also have a narcotic, oxycodone, to take as needed if the above medicines don't help, but be careful if you are also taking tramadol, as they both will sedate you. Try one at a time.  Postop constipation is a major cause of pain. Stay well hydrated, walk as you tolerate, and take over the counter senna as well as stool softeners if you need them. I have included constipation instructions below.   Signs and Symptoms to Report Call our office at [phone] if you have any of the following.  . Fever over 100.4 degrees or higher . Severe stomach pain not relieved with pain medications . Bright red bleeding that's heavier than a period that does not slow with rest . To go the bathroom a lot (frequency), you can't hold your urine (urgency), or it hurts when you empty your bladder (urinate) . Chest pain . Shortness of breath . Pain in the calves of your legs . Severe nausea and vomiting not relieved with anti-nausea medications . Signs of infection around your wounds, such as redness, hot to touch, swelling, green/yellow drainage (like pus), bad smelling discharge . Any concerns  What You Can Expect after Surgery . You may see some pink tinged, bloody fluid and bruising around the wound. This is normal. . You may have a sore throat because of the tube in your mouth during general anesthesia. This will go away in 2 to 3 days. . You may have some stomach cramps. . You may notice spotting on your panties.   Activities after Your Discharge Follow these guidelines to help speed your  recovery at home: . Do the coughing and deep breathing as you did in the hospital for 2 weeks. Use the small blue breathing device, called the incentive spirometer for 2 weeks. . Don't drive if you are in pain or taking narcotic pain medicine. You may drive when you can safely slam on the brakes, turn the wheel forcefully, and rotate your torso comfortably. This is typically 1-2 weeks. Practice in a parking lot or side street prior to attempting to drive regularly.  . Ask others to help with household chores for 4 weeks. . Do not lift anything heavier that 10 pounds for 4-6 weeks. This includes pets, children, and groceries. . Don't do strenuous activities, exercises, or sports like vacuuming, tennis, squash, etc. until your doctor says it is safe to do so. ---Maintain pelvic rest for 8 weeks. This means nothing in the vagina or rectum at all (no douching, tampons, intercourse) for 8 weeks.  . Walk as you feel able. Rest often since it may take two or three weeks for your energy level to return to normal.  . You may climb stairs . Avoid constipation:   -Eat fruits, vegetables, and whole grains. Eat small meals as your appetite will take time to return to normal.   -Drink 6 to 8 glasses of water each day unless your doctor has told you to limit your fluids.   -Use a laxative or stool softener as needed if constipation becomes a  problem. You may take Miralax, metamucil, Citrucil, Colace, Senekot, FiberCon, etc. If this does not relieve the constipation, try two tablespoons of Milk Of Magnesia every 8 hours until your bowels move.  . You may shower. Gently wash the wounds with a mild soap and water. Pat dry. . Do not get in a hot tub, swimming pool, etc. for 6 weeks. . Do not use lotions, oils, powders on the wounds. . Do not douche, use tampons, or have sex until your doctor says it is okay. . Take your pain medicine when you need it. The medicine may not work as well if the pain is bad.  Take the  medicines you were taking before surgery. Other medications you will need are pain medications (Norco or Percocet) and nausea medications (Zofran).      Here is a helpful article from the website [employer], regarding constipation  Here are reasons why constipation occurs after surgery: 1) During the operation and in the recovery room, most people are given opioid pain medication, primarily through an IV, to treat moderate or severe pain. Intravenous opioids include morphine, Dilaudid and fentanyl. After surgery, patients are often prescribed opioid pain medication to take by mouth at home, including codeine, Vicodin, Norco, and Percocet. All of these medications cause constipation by slowing down the movement of your intestine. 2) Changes in your diet before surgery can be another culprit. It is common to get specific instructions to change how you normally eat or drink before your surgery, like only having liquids the day before or not having anything to eat or drink after midnight the night before surgery. For this reason, temporary dehydration may occur. This, along with not eating or only having liquids, means that you are getting less fiber than usual. Both these factors contribute to constipation. 3) Changes in your diet after surgery can also contribute to the problem. Although many people don't have dietary restrictions after operations, being under anesthesia can make you lose your appetite for several hours and maybe even days. Some people can even have nausea or vomiting. Not eating or drinking normally means that you are not getting enough fiber and you can get dehydrated, both leading to constipation. 4) Lying in a bed more than usual--which happens before, during and after surgery--combined with the medications and diet changes, all work together to slow down your colon and make your poop turn to rock.  No one likes to be constipated.  Let's face it, it's not a pleasant feeling when  you don't poop for days, then strain on the toilet to finally pass something large enough to cause damage. An ounce of prevention is worth a pound of cure, so: 1. Assume you will be constipated. 2. Plan and prepare accordingly. Post-surgery is one of those unique situations where the temporary use of laxatives can make a world of difference. Always consult with your doctor, and recognize that if you wait several days after surgery to take a laxative, the constipation might be too severe for these over-the-counter options. It is always important to discuss all medications you plan on taking with your doctor. Ask your doctor if you can start the laxative immediately after surgery. *  Here are go-to post-surgery laxatives: Senna: Senna is an herb that acts as a "stimulant laxative," meaning it increases the activity of the intestine to cause you to have a bowel movement. It comes in many forms, but senna pills are easy to take and are sold over the counter at almost  all pharmacies. Since opioid pain medications slow down the activity of the intestine, it makes sense to take a medication to help reverse that side effect. Long-term use of a stimulant laxative is not a good idea since it can make your colon "lazy" and not function properly; however, temporary use immediately after surgery is acceptable. In general, if you are able to eat a normal diet, taking senna soon after surgery works the best. Senna usually works within hours to produce a bowel movement, but this is less predictable when you are taking different medications after surgery. Try not to wait several days to start taking senna, as often it is too late by then. Just like with all medications or supplements, check with your doctor before starting new treatment.   Magnesium: Magnesium is an important mineral that our body needs. We get magnesium from some foods that we eat, especially foods that are high in fiber such as broccoli, almonds and whole  grains. There are also magnesium-based medications used to treat constipation including milk of magnesia (magnesium hydroxide), magnesium citrate and magnesium oxide. They work by drawing water into the intestine, putting it into the class of "osmotic" laxatives. Magnesium products in low doses appear to be safe, but if taken in very large doses, can lead to problems such as irregular heartbeat, low blood pressure and even death. It can also affect other medications you might be taking, therefore it is important to discuss using magnesium with your physician and pharmacist before initiating therapy. Most over-the-counter magnesium laxatives work very well to help with the constipation related to surgery, but sometimes they work too well and lead to diarrhea. Make sure you are somewhere with easy access to a bathroom, just in case.   Bisacodyl: Bisacodyl (generic name) is sold under brand names such as Dulcolax. Much like senna, it is a "stimulant laxative," meaning it makes your intestines move more quickly to push out the stool. This is another good choice to start taking as soon as your doctor says you can take a laxative after surgery. It comes in pill form and as a suppository, which is a good choice for people who cannot or are not allowed to swallow pills. Studies have shown that it works as a laxative, but like most of these medications, you should use this on a short-term basis only.   Enema: Enemas strike fear in many people, but FEAR NOT! It's nowhere near as big a deal as you may think. An enema is just a way to get some liquid into your rectum by placing a specially designed device through your anus. If you have never done one, it might seem like a painful, unpleasant, uncomfortable, complicated and lengthy procedure. But in reality, it's simple, takes just a few seconds and is highly effective. The small ready-made bottles you buy at the pharmacy are much easier than the hose/large rubber container  type. Those recommended positions illustrated in some instructions are generally not necessary to place the enema. It's very similar to the insertion of a tampon, requiring a slight squat. Some extra lubrication on the enema's tip (or on your anus) will make it a breeze. In certain cases, there is no substitute for a good enema. For example, if someone has not pooped for a few days, the beginning of the poop waiting to come out can become rock hard. Passing that hard stool can lead to much pain and problems like anal fissures. Inserting a little liquid to break up the  rock-hard stool will help make its passage much easier. Enemas come with different liquids. Most come with saline, but there are also mineral oil options. You can also use warm water in the reusable enema containers. They all work. But since saline can sometimes be irritating, so try a mineral oil or water enema instead.  Here are commonly recommended constipation medications that do not work well for post-surgery constipation: Docusate: Docusate (generic name) most commonly referred to as Colace (brand name) is not really a laxative, but is classified as a stool softener. Although this medication is commonly prescribed, it is not recommended for several reasons: 1) there is no good medical evidence that it works 2) even if it has an effect, which is very questionable, it is minimal and cannot combat the intestinal slowing caused by the opioid medications. Skip docusate to save money and space in your pillbox for something more effective.  PEG: Miralax (brand name) is basically a chemical called polyethylene glycol (PEG) and it has gained tremendous popularity as a laxative. This product is an "osmotic laxative" meaning it works by pulling water into the stool, making it softer. This is very similar to the action of natural fiber in foods and supplements. Therefore, the effect seen by this medication is not immediate, causing a bowel movement in  a day or more. Is this medication strong enough to battle the constipation related to having an operation? Maybe for some people not prone to constipation. But for most people, other laxatives are better to prevent constipation after surgery. AMBULATORY SURGERY  DISCHARGE INSTRUCTIONS   1) The drugs that you were given will stay in your system until tomorrow so for the next 24 hours you should not:  A) Drive an automobile B) Make any legal decisions C) Drink any alcoholic beverage   2) You may resume regular meals tomorrow.  Today it is better to start with liquids and gradually work up to solid foods.  You may eat anything you prefer, but it is better to start with liquids, then soup and crackers, and gradually work up to solid foods.   3) Please notify your doctor immediately if you have any unusual bleeding, trouble breathing, redness and pain at the surgery site, drainage, fever, or pain not relieved by medication.    4) Additional Instructions:    Please contact your physician with any problems or Same Day Surgery at [phone], Monday through Friday 6 am to 4 pm, or [place] [facility] number at [phone].

## 2019-12-04 NOTE — Progress Notes (Signed)
Patient failed voiding trial and was cathed for a significant amount of urine. She was discharged home with a foley cath with plan for removal in the office on Wednesday and voiding trial at that time. In the past, she has needed a foley for 2 weeks postop.

## 2019-12-04 NOTE — Op Note (Addendum)
[name] OBSJGGEZ PROCEDURE DATE: 12/04/2019  PREOPERATIVE DIAGNOSIS:   Pelvic pain, abnormal bleeding, anterior pelvic organ prolapse, annoying left inner thigh mole  POSTOPERATIVE DIAGNOSIS:   Same  SURGEON:   Benjaman Kindler, M.D. ASSISTANT: Boykin Nearing, M.D. OPERATION:  Total Vaginal Hysterectomy, right salpingectomy, anterior repair, excision of right inner thigh/buttock mole  ANESTHESIA:  Total IV anesthesia sedation due to prior hx of postop nausea  Anesthesiologist: Anesthesiologist: Tera Mater, MD CRNA: Justus Memory, CRNA; Einar Pheasant B, CRNA  INDICATIONS: The patient is a 39 y.o. [phone] with history of prior BTL, endometrial ablation, prior anterior repair, now with abnormal uterine bleeding with dysmenorrhea and grade 3 cystocele. The patient made a decision to undergo definitive surgical treatment. On the preoperative visit, the risks, benefits, indications, and alternatives of the procedure were reviewed with the patient.  On the day of surgery, the risks of surgery were again discussed with the patient including but not limited to: bleeding which may require transfusion or reoperation; infection which may require antibiotics; injury to bowel, bladder, ureters or other surrounding organs; need for additional procedures; thromboembolic phenomenon, incisional problems and other postoperative/anesthesia complications. Written informed consent was obtained.    OPERATIVE FINDINGS: A 7 week size uterus with normal tubes and ovaries bilaterally, simple ovarian cyst on the left that was drained,   ESTIMATED BLOOD LOSS: 50 ml FLUIDS:  1100 ml of Lactated Ringers URINE OUTPUT:  50 ml of clear yellow urine. SPECIMENS:  Uterus and cervix and portion of right tube sent to pathology COMPLICATIONS:  None immediate.  DESCRIPTION OF PROCEDURE:  The patient received prophylactic intravenous antibiotics and had sequential compression devices applied to her lower  extremities while in the preoperative area.    She was taken to the operating room, where she was identified by name and birth date. General anesthesia was administered and was found to be adequate.  She was placed in the dorsal lithotomy position, and was prepped and draped in a sterile manner.  A formal time out procedure was performed with all team members present and in agreement. A Foley catheter was inserted into her bladder and attached to gravity drainage. Attention was turned to her pelvis. Of note, all sutures used in this case were 0 Vicryl unless otherwise noted.     A weighted speculum was placed in the vagina, and the anterior and posterior lips of the cervix were grasped bilaterally with thyroid tenaculums.  The cervix was then injected circumferentially with 0.25% Marcaine with epinephrine solution to maintain hemostasis.  The posterior cul-de-sac was entered sharply in the midline.   A long weighted speculum was inserted into the posterior cul-de-sac. The cervix was circumferentially incised using electrocautery, and the bladder was dissected off the pubocervical fascia anteriorly with sharp and careful blunt dissection without complication.  The anterior cul-de-sac was then entered sharply without difficulty and the bladder retracted out of the operative field behind a retractor. The Heaney clamp was then used to clamp the uterosacral ligaments on either side.  They were then cut and sutured ligated with 0 Vicryl, and the ligated uterosacral ligaments were transfixed to the ipsilateral vaginal epithelium to further support the vagina and provide hemostasis. The cardinal ligaments were then clamped, cut and ligated. The uterine vessels and broad ligaments were then serially clamped with the Heaney clamps, cut, and suture ligated on both sides.  Excellent hemostasis was noted at this point.    The uterus was then delivered via the posterior cul-de-sac, and the  cornua were clamped with the  Heaney clamps, transected, and the uterine specimen was delivered and sent to pathology. These pedicles were then suture ligated to ensure hemostasis.   BILATERAL SALPINGECTOMY PARAGRAPH The right Fallopian tube was then identified and individually grasped with Babcock clamps. It was clamped across entirely with a Heaney clamp and free-tied, followed by suture ligation for excellent hemostasis bilaterally. The ovaries were left intact and in place. The left tube was unable to be found.  After completion of the hysterectomy, all pedicles from the uterosacral ligament to the cornua were examined hemostasis was confirmed.  The peritoneum was closed in a purse string fashion with 2-0 PDS, taking care not to incorporate any intraabdominal organs in the closure.  Anterior repair (Cystocele repair)  Attention was then turned to the anterior vaginal mucosa that was grasped in the midline approx 1cm proximate to the ureteral opening using an Allis clamp. Two Allis clamps were used to identify the terminus of the incision just distal to the vaginal cuff and injected with 1% local 1:100000 lidocaine with epi. A cross incision was made at the apex. A midline mucosal incision was made from the bladder neck to the vaginal apex. The edges of this incision were grasped with a series of Kelly clamps and the vaginal mucosa was dissected off the underlying pubocervical fascia bilaterally using blunt and sharp dissection.  The dissection was taken to the paravaginal spaces bilaterally.  The pubocervical fascia was then plicated in the midline using 2-0 Vicryl sutures from the bladder neck to the bladder base resulting in excellent surgical correction of the cystocele.  Excess vaginal tissue was then excised bilaterally and the incision was then closed with 0 Vicryl in a running suture.  The uterosacral ligaments were brought together and tied in the midline, creating a culdoplasty with the closure of the peritoneum above. The  full incision was closed in a running fashion. A 22mm elliptical incision was used to remove a small regular mole on the inner left thigh, which was closed with 4-0 monocryl and dermabond.  Overall excellent hemostasis was noted. (Because of insurance decisions, Estrogen cream was not placed along the incision, but she will be given a prescription for the same). At her request, we also did not pack the vagina with gauze. A foley catheter was not placed, but a voiding trial will be performed prior to discharge.   The patient tolerated the procedure well without complications.  Sponge, lap, instrument and needle counts were correct x2.  She was taken to the recovery room in stable condition.

## 2019-12-04 NOTE — Progress Notes (Signed)
Called for pain meds and Dr. Amie Critchley stated I can give 5-10 mg of oxy so I will give 10 mg os Oxy IR.

## 2019-12-04 NOTE — Transfer of Care (Signed)
Immediate Anesthesia Transfer of Care Note  Patient: [name]  Procedure(s) Performed: HYSTERECTOMY VAGINAL, CULDOPLASTY (N/A ) ANTERIOR REPAIR (CYSTOCELE) (N/A )  Patient Location: PACU  Anesthesia Type:General  Level of Consciousness: sedated  Airway & Oxygen Therapy: Patient Spontanous Breathing and Patient connected to face mask oxygen  Post-op Assessment: Report given to RN and Post -op Vital signs reviewed and stable  Post vital signs: Reviewed and stable  Last Vitals:  Vitals Value Taken Time  BP 106/63 12/04/19 1000  Temp 36.4 C 12/04/19 1000  Pulse 63 12/04/19 1004  Resp 13 12/04/19 1004  SpO2 97 % 12/04/19 1004  Vitals shown include unvalidated device data.  Last Pain:  Vitals:   12/04/19 1000  TempSrc:   PainSc: Asleep         Complications: No apparent anesthesia complications

## 2019-12-04 NOTE — Anesthesia Preprocedure Evaluation (Addendum)
Anesthesia Evaluation  Patient identified by MRN, date of birth, ID band Patient awake    Reviewed: Allergy & Precautions, H&P , NPO status , Patient's Chart, lab work & pertinent test results  History of Anesthesia Complications (+) PONV and history of anesthetic complications (PONV and "emotional after anesthesia")  Airway Mallampati: II  TM Distance: >3 FB Neck ROM: full    Dental  (+) Teeth Intact   Pulmonary neg COPD, former smoker,    breath sounds clear to auscultation       Cardiovascular hypertension, (-) angina(-) Past MI and (-) Cardiac Stents (-) dysrhythmias  Rhythm:regular Rate:Normal     Neuro/Psych  Headaches, PSYCHIATRIC DISORDERS Anxiety    GI/Hepatic Neg liver ROS, GERD  ,  Endo/Other  negative endocrine ROS  Renal/GU      Musculoskeletal   Abdominal   Peds  Hematology negative hematology ROS (+)   Anesthesia Other Findings Past Medical History: No date: Anemia     Comment:  during pregnancy No date: Carpal tunnel syndrome, bilateral No date: Complication of anesthesia     Comment:  states gets emotional after anesthesia No date: Fibromyalgia No date: GERD (gastroesophageal reflux disease) No date: History of kidney stones     Comment:  h/o No date: Hypertension     Comment:  states under control with meds., has been on med. since               2005 03/2018: Menorrhagia No date: Migraines     Comment:  migraines 1995: Nephritis     Comment:  Glomerular nephritis. Age 39.  No issues since. No date: Rheumatoid arthritis [facility])  Past Surgical History: 04/23/2018: CARPAL TUNNEL RELEASE; Right     Comment:  Procedure: ENDOSCOPIC RIGHT CARPAL TUNNEL RELEASE;                Surgeon: Corky Mull, MD;  Location: [place];  Service: Orthopedics;  Laterality: Right; 12/26/2013: CHOLECYSTECTOMY 08/28/2016: CYSTOCELE REPAIR; N/A     Comment:  Procedure: ANTERIOR REPAIR  (CYSTOCELE);  Surgeon: Emily Filbert, MD;  Location: [facility];  Service: Gynecology;                Laterality: N/A; 03/09/2011: DILATION AND EVACUATION     Comment:  Procedure: DILATATION AND EVACUATION (D&E);  Surgeon:               Donnamae Jude, MD;  Location: [facility];  Service:               Gynecology;  Laterality: N/A; No date: ENDOMETRIAL ABLATION 03/19/2018: HYSTEROSCOPY WITH D & C; N/A     Comment:  Procedure: DILATION AND CURETTAGE /HYSTEROSCOPY WITH               MINERVA ABLATION;  Surgeon: Donnamae Jude, MD;                Location: [place];  Service:               Gynecology;  Laterality: N/A; 08/28/2016: LAPAROSCOPIC BILATERAL SALPINGECTOMY; Bilateral     Comment:  Procedure: LAPAROSCOPIC BILATERAL SALPINGECTOMY;                Surgeon: Emily Filbert, MD;  Location: [facility];  Service:  Gynecology;  Laterality: Bilateral; 08/28/2016: PERINEOPLASTY; N/A     Comment:  Procedure: PERINEOPLASTY;  Surgeon: Emily Filbert, MD;                Location: [facility];  Service: Gynecology;  Laterality: N/A; 1988: TONSILLECTOMY No date: TYMPANOSTOMY TUBE PLACEMENT  BMI    Body Mass Index: 31.41 kg/m      Reproductive/Obstetrics negative OB ROS                            Anesthesia Physical Anesthesia Plan  ASA: II  Anesthesia Plan: General ETT   Post-op Pain Management:    Induction:   PONV Risk Score and Plan: Ondansetron, Dexamethasone, Midazolam, Treatment may vary due to age or medical condition, Propofol infusion, Scopolamine patch - Pre-op and TIVA  Airway Management Planned:   Additional Equipment:   Intra-op Plan:   Post-operative Plan:   Informed Consent: I have reviewed the patients History and Physical, chart, labs and discussed the procedure including the risks, benefits and alternatives for the proposed anesthesia with the patient or authorized representative who has indicated his/her understanding and  acceptance.     Dental Advisory Given  Plan Discussed with: Anesthesiologist, CRNA and Surgeon  Anesthesia Plan Comments:        Anesthesia Quick Evaluation

## 2019-12-04 NOTE — Progress Notes (Signed)
Per Dr Leafy Ro do a voiding trial. Bladder san patient and if unable to void let her know. Patient may have to go home with a  Foley.

## 2019-12-05 NOTE — Anesthesia Postprocedure Evaluation (Signed)
Anesthesia Post Note  Patient: MAURICIA MERTENS  Procedure(s) Performed: HYSTERECTOMY VAGINAL, CULDOPLASTY (N/A ) ANTERIOR REPAIR (CYSTOCELE) (N/A )  Patient location during evaluation: PACU Anesthesia Type: General Level of consciousness: awake and alert Pain management: pain level controlled Vital Signs Assessment: post-procedure vital signs reviewed and stable Respiratory status: spontaneous breathing, nonlabored ventilation and respiratory function stable Cardiovascular status: blood pressure returned to baseline and stable Postop Assessment: no apparent nausea or vomiting Anesthetic complications: no     Last Vitals:  Vitals:   12/04/19 1342 12/04/19 1458  BP: (!) 141/73 134/70  Pulse: 76 74  Resp: 18 17  Temp: 37.3 C 36.7 C  SpO2: 99% 98%    Last Pain:  Vitals:   12/04/19 1458  TempSrc:   PainSc: [place]

## 2019-12-07 LAB — SURGICAL PATHOLOGY

## 2020-01-24 ENCOUNTER — Encounter: Payer: Self-pay | Admitting: Emergency Medicine

## 2020-01-24 ENCOUNTER — Other Ambulatory Visit: Payer: Self-pay

## 2020-01-24 ENCOUNTER — Emergency Department
Admission: EM | Admit: 2020-01-24 | Discharge: 2020-01-24 | Disposition: A | Discharge status: Home / Self Care | Payer: 59 | Attending: Emergency Medicine | Admitting: Emergency Medicine

## 2020-01-24 DIAGNOSIS — R109 Unspecified abdominal pain: Secondary | ICD-10-CM | POA: Diagnosis present

## 2020-01-24 DIAGNOSIS — Z90710 Acquired absence of both cervix and uterus: Secondary | ICD-10-CM | POA: Insufficient documentation

## 2020-01-24 DIAGNOSIS — Z5321 Procedure and treatment not carried out due to patient leaving prior to being seen by health care provider: Secondary | ICD-10-CM | POA: Insufficient documentation

## 2020-01-24 LAB — URINALYSIS, COMPLETE (UACMP) WITH MICROSCOPIC
Bacteria, UA: NONE SEEN
Bilirubin Urine: NEGATIVE
Glucose, UA: NEGATIVE mg/dL
Hgb urine dipstick: NEGATIVE
Ketones, ur: NEGATIVE mg/dL
Leukocytes,Ua: NEGATIVE
Nitrite: NEGATIVE
Protein, ur: NEGATIVE mg/dL
Specific Gravity, Urine: 1.005 (ref 1.005–1.030)
pH: 5 (ref 5.0–8.0)

## 2020-01-24 LAB — CBC
HCT: 38.8 % (ref 36.0–46.0)
Hemoglobin: 13.4 g/dL (ref 12.0–15.0)
MCH: 30.5 pg (ref 26.0–34.0)
MCHC: 34.5 g/dL (ref 30.0–36.0)
MCV: 88.2 fL (ref 80.0–100.0)
Platelets: 283 10*3/uL (ref 150–400)
RBC: 4.4 MIL/uL (ref 3.87–5.11)
RDW: 13.3 % (ref 11.5–15.5)
WBC: 8.9 10*3/uL (ref 4.0–10.5)
nRBC: 0 % (ref 0.0–0.2)

## 2020-01-24 LAB — COMPREHENSIVE METABOLIC PANEL
ALT: 17 U/L (ref 0–44)
AST: 15 U/L (ref 15–41)
Albumin: 4.7 g/dL (ref 3.5–5.0)
Alkaline Phosphatase: 61 U/L (ref 38–126)
Anion gap: 11 (ref 5–15)
BUN: 13 mg/dL (ref 6–20)
CO2: 22 mmol/L (ref 22–32)
Calcium: 9.7 mg/dL (ref 8.9–10.3)
Chloride: 100 mmol/L (ref 98–111)
Creatinine, Ser: 0.73 mg/dL (ref 0.44–1.00)
GFR calc Af Amer: >60 mL/min (ref >60)
GFR calc non Af Amer: >60 mL/min (ref >60)
Glucose, Bld: 100 mg/dL — ABNORMAL HIGH (ref 70–99)
Potassium: 3.3 mmol/L — ABNORMAL LOW (ref 3.5–5.1)
Sodium: 133 mmol/L — ABNORMAL LOW (ref 135–145)
Total Bilirubin: 0.8 mg/dL (ref 0.3–1.2)
Total Protein: 7.9 g/dL (ref 6.5–8.1)

## 2020-01-24 LAB — LIPASE, BLOOD: Lipase: 77 U/L — ABNORMAL HIGH (ref 11–51)

## 2020-01-24 NOTE — ED Triage Notes (Signed)
Pt reports today was at work and started having some abd cramping that feels like contractions. Pt reports had a hysterectomy so knows she is not pregnant. Pt denies NVD or other sx's.

## 2020-01-25 ENCOUNTER — Other Ambulatory Visit: Payer: Self-pay | Admitting: Family Medicine

## 2020-01-25 ENCOUNTER — Ambulatory Visit
Admission: RE | Admit: 2020-01-25 | Discharge: 2020-01-25 | Disposition: A | Discharge status: Home / Self Care | Payer: 59 | Source: Ambulatory Visit | Attending: Family Medicine | Admitting: Family Medicine

## 2020-01-25 DIAGNOSIS — R748 Abnormal levels of other serum enzymes: Secondary | ICD-10-CM

## 2020-01-25 DIAGNOSIS — R1031 Right lower quadrant pain: Secondary | ICD-10-CM | POA: Insufficient documentation

## 2020-01-25 DIAGNOSIS — R1032 Left lower quadrant pain: Secondary | ICD-10-CM | POA: Insufficient documentation

## 2020-01-25 DIAGNOSIS — R110 Nausea: Secondary | ICD-10-CM

## 2020-01-25 HISTORY — DX: Systemic involvement of connective tissue, unspecified: M35.9

## 2020-01-25 IMAGING — CT CT ABD-PELV W/ CM
2 of 4 series · 16 of 46 positions shown, 18 images · IV contrast (APPLIED)
Comparison: [DATE]

CLINICAL DATA: Nausea and abdominal pain yesterday. Hysterectomy
[DATE]. Cholecystectomy.

EXAM:
CT ABDOMEN AND PELVIS WITH CONTRAST
TECHNIQUE: Multidetector CT imaging of the abdomen and pelvis was performed
using the standard protocol following bolus administration of
intravenous contrast.
CONTRAST:  100mL OMNIPAQUE IOHEXOL 300 MG/ML  SOLN

[Series 2: routine abd/pel with · axial · 0.84mm/px · z∈[-520,-30]mm · 13 of 108 slices shown, 15 images]
[im 5/108  soft-tissue]
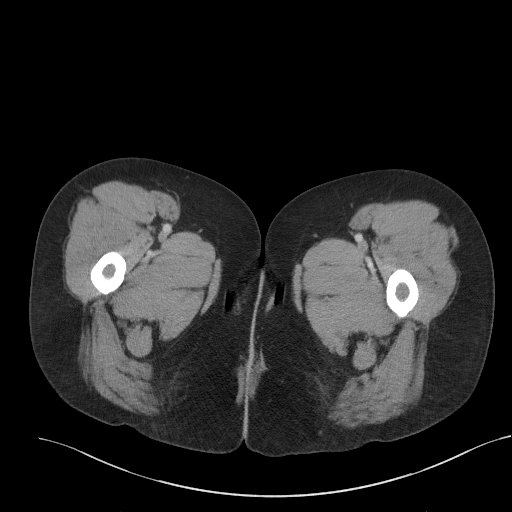
[im 5/108  bone]
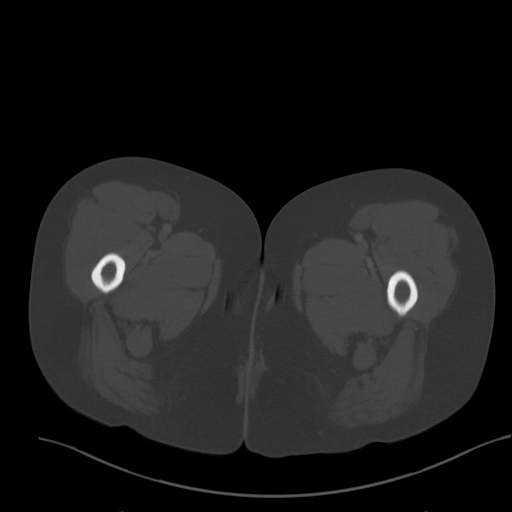
[im 14/108  soft-tissue]
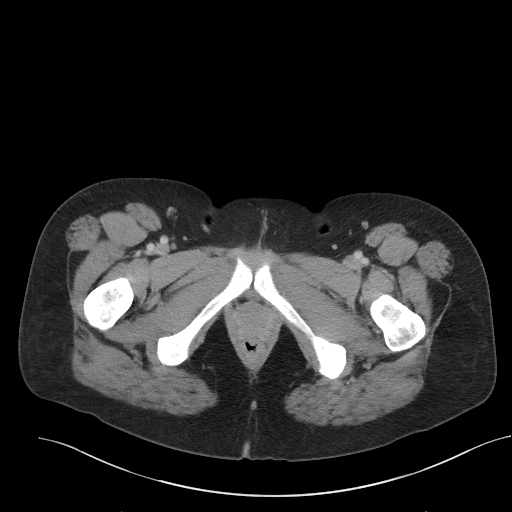
[im 24/108  soft-tissue]
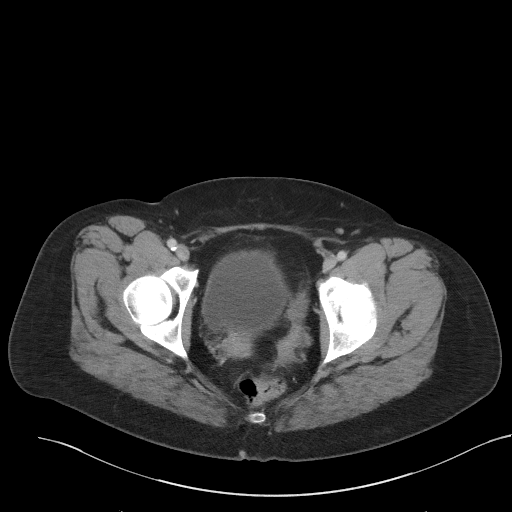
[im 28/108  soft-tissue]
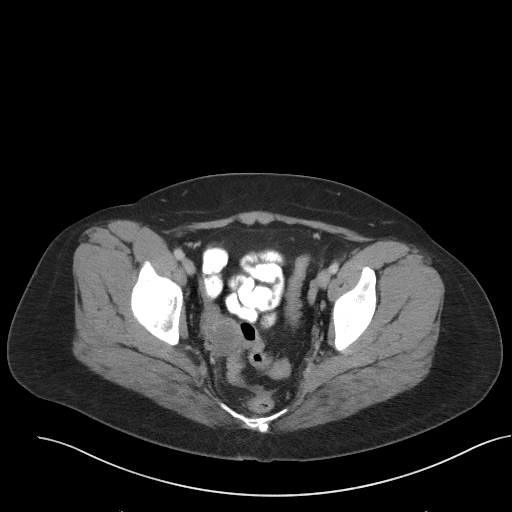
[im 38/108  soft-tissue]
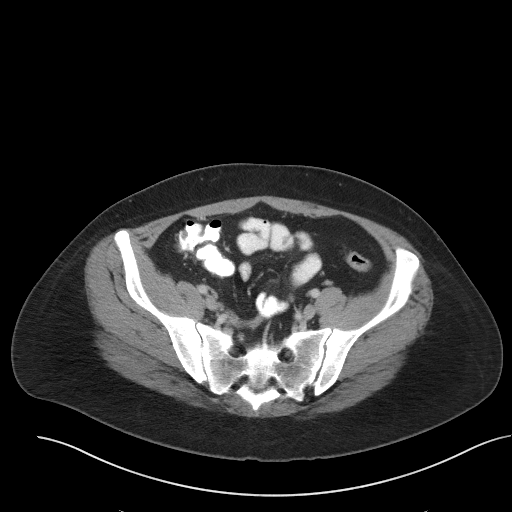
[im 47/108  soft-tissue]
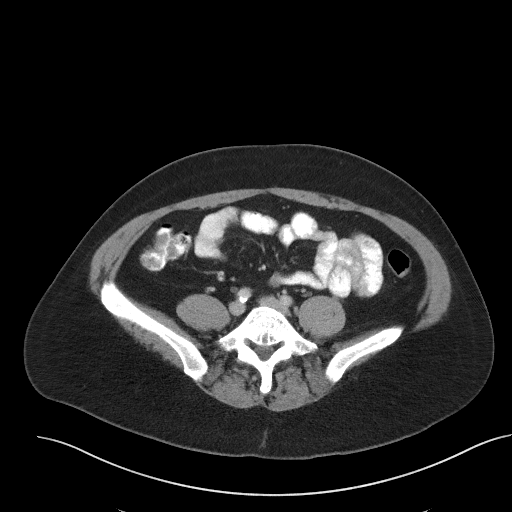
[im 56/108  soft-tissue]
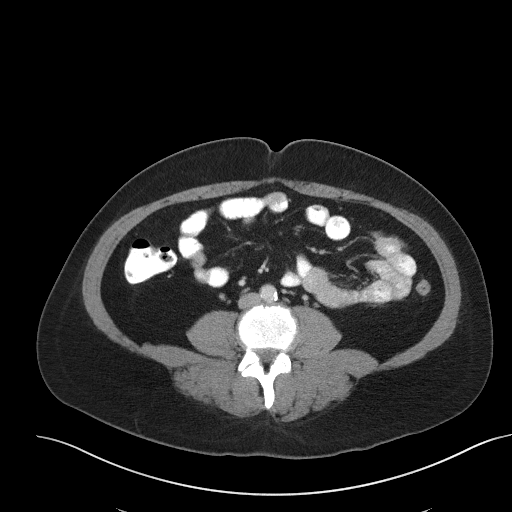
[im 61/108  soft-tissue]
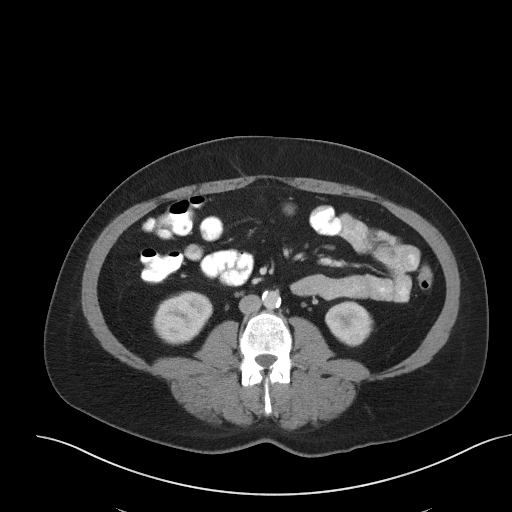
[im 70/108  soft-tissue]
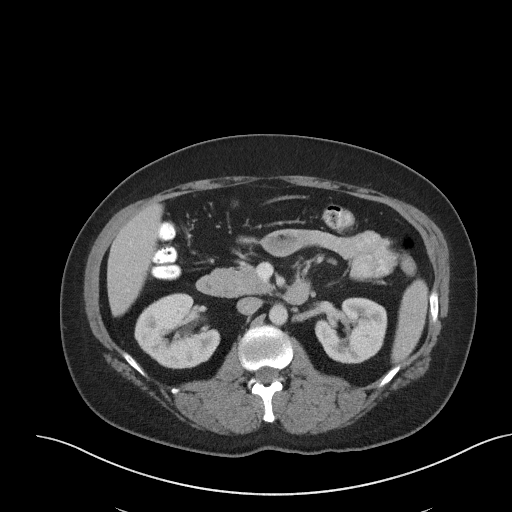
[im 70/108  bone]
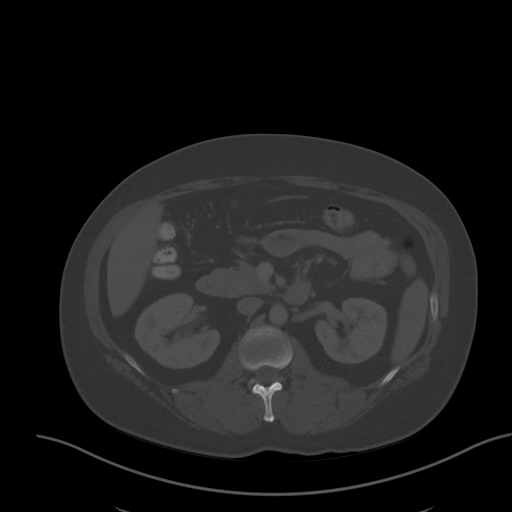
[im 80/108  soft-tissue]
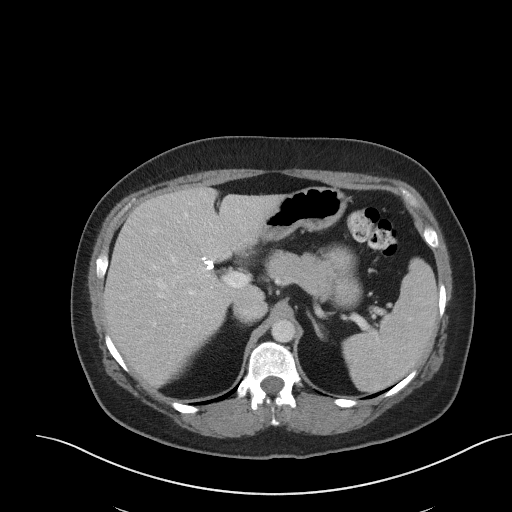
[im 84/108  soft-tissue]
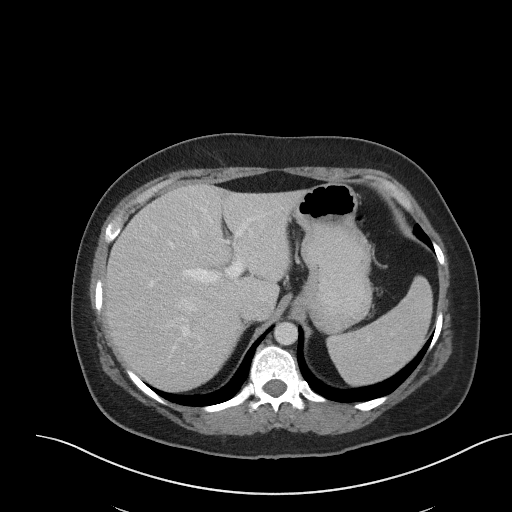
[im 94/108  soft-tissue]
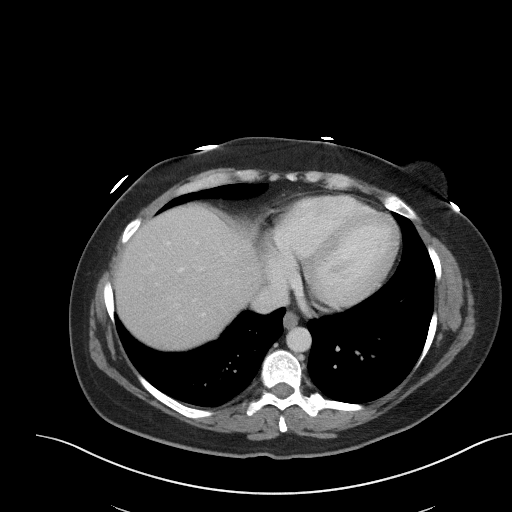
[im 103/108  soft-tissue]
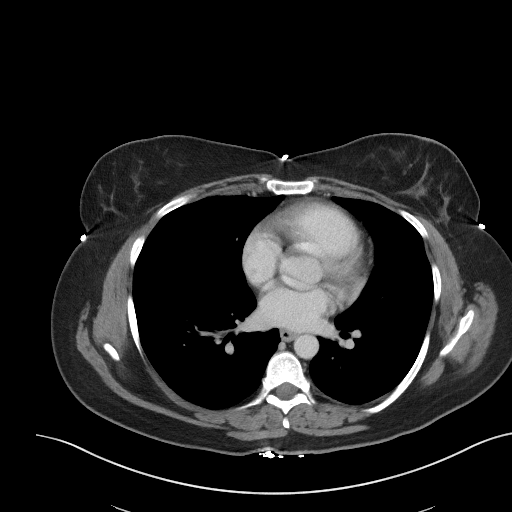

[Series 5: coronal st · coronal · 0.82mm/px · 3 of 89 slices shown]
[im 30/89  soft-tissue]
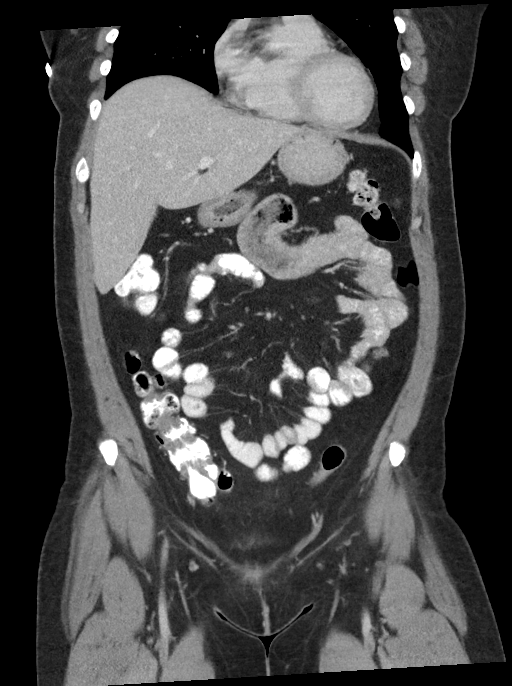
[im 40/89  soft-tissue]
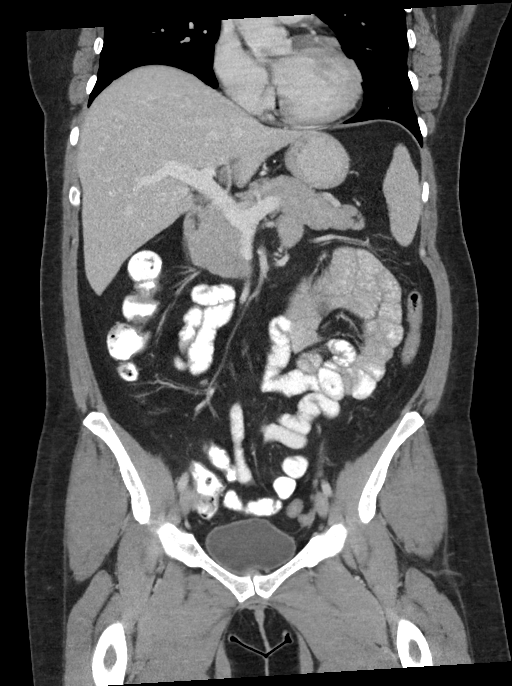
[im 49/89  soft-tissue]
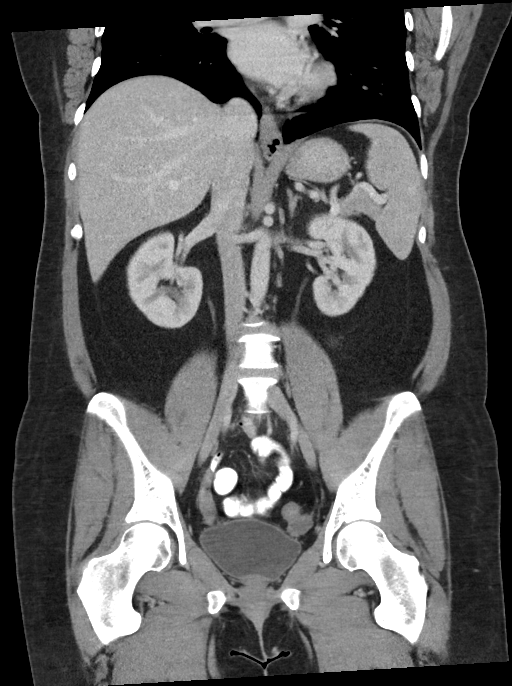

[16 of 46 positions shown; findings below may reference images not displayed]

FINDINGS: Lower chest: Clear lung bases. Normal heart size without pericardial
or pleural effusion.

Hepatobiliary: Focal steatosis adjacent the falciform ligament.
Cholecystectomy, without biliary ductal dilatation.

Pancreas: Normal, without mass or ductal dilatation.

Spleen: Normal in size, without focal abnormality.

Adrenals/Urinary Tract: Normal adrenal glands. Punctate interpolar
left renal collecting system calculus. Normal right kidney. No
hydronephrosis. Normal urinary bladder.

Stomach/Bowel: Normal stomach, without wall thickening. Normal colon
and terminal ileum. Normal appendix, including on 71/2. Normal small
bowel.

Vascular/Lymphatic: Aortic atherosclerosis. No abdominopelvic
adenopathy.

Reproductive: Hysterectomy.  No adnexal mass.

Other: Trace free pelvic fluid is likely physiologic. No free
intraperitoneal air.

Musculoskeletal: Probable bone island in the right iliac.
IMPRESSION: 1. No acute process in the abdomen or pelvis.
2. Left nephrolithiasis.
3. Aortic Atherosclerosis ([FG]-[FG]). This is significantly age
advanced.

## 2020-01-25 MED ORDER — IOHEXOL 300 MG/ML  SOLN
100.0000 mL | Freq: Once | INTRAMUSCULAR | Status: AC | PRN
  Administered 2020-01-25: 100 mL via INTRAVENOUS

## 2020-03-28 ENCOUNTER — Other Ambulatory Visit: Payer: Self-pay

## 2020-03-28 ENCOUNTER — Encounter: Payer: Self-pay | Admitting: Emergency Medicine

## 2020-03-28 DIAGNOSIS — Z5321 Procedure and treatment not carried out due to patient leaving prior to being seen by health care provider: Secondary | ICD-10-CM | POA: Diagnosis not present

## 2020-03-28 DIAGNOSIS — R110 Nausea: Secondary | ICD-10-CM | POA: Diagnosis present

## 2020-03-28 DIAGNOSIS — U071 COVID-19: Secondary | ICD-10-CM | POA: Insufficient documentation

## 2020-03-28 LAB — BASIC METABOLIC PANEL
Anion gap: 9 (ref 5–15)
BUN: 9 mg/dL (ref 6–20)
CO2: 25 mmol/L (ref 22–32)
Calcium: 8.6 mg/dL — ABNORMAL LOW (ref 8.9–10.3)
Chloride: 101 mmol/L (ref 98–111)
Creatinine, Ser: 0.7 mg/dL (ref 0.44–1.00)
GFR calc Af Amer: >60 mL/min (ref >60)
GFR calc non Af Amer: >60 mL/min (ref >60)
Glucose, Bld: 107 mg/dL — ABNORMAL HIGH (ref 70–99)
Potassium: 3.5 mmol/L (ref 3.5–5.1)
Sodium: 135 mmol/L (ref 135–145)

## 2020-03-28 LAB — CBC
HCT: 35.2 % — ABNORMAL LOW (ref 36.0–46.0)
Hemoglobin: 12.3 g/dL (ref 12.0–15.0)
MCH: 31.1 pg (ref 26.0–34.0)
MCHC: 34.9 g/dL (ref 30.0–36.0)
MCV: 88.9 fL (ref 80.0–100.0)
Platelets: 189 10*3/uL (ref 150–400)
RBC: 3.96 MIL/uL (ref 3.87–5.11)
RDW: 13.7 % (ref 11.5–15.5)
WBC: 1.8 10*3/uL — ABNORMAL LOW (ref 4.0–10.5)
nRBC: 0 % (ref 0.0–0.2)

## 2020-03-28 NOTE — ED Triage Notes (Signed)
Pt in via [facility] from home, complaints of headache, body aches, fatigue, nausea/vomiting; symptoms ongoing since Friday.  Reports recent positive Covid test.  Vitals WDL, NAD noted at this time.

## 2020-03-28 NOTE — ED Triage Notes (Signed)
First Nurse NOte:  Arrives via [facility].  Tested positive for COVID 3 days ago at [facility].  Arrives today with c/o nausea.  Per EMS report, VSS.

## 2020-03-29 ENCOUNTER — Telehealth: Payer: Self-pay | Admitting: Emergency Medicine

## 2020-03-29 ENCOUNTER — Emergency Department
Admission: EM | Admit: 2020-03-29 | Discharge: 2020-03-29 | Disposition: A | Discharge status: Home / Self Care | Payer: 59 | Attending: Emergency Medicine | Admitting: Emergency Medicine

## 2020-03-29 ENCOUNTER — Encounter: Payer: Self-pay | Admitting: Physician Assistant

## 2020-03-29 NOTE — ED Notes (Signed)
No answer when called several times from lobby 

## 2020-03-29 NOTE — Telephone Encounter (Signed)
Called patient due to lwot to inquire about condition and follow up plans. She says her doctor is getting her set up for infusion.  I told her to make sure her doctor reviewed her labs done here.

## 2020-05-19 ENCOUNTER — Other Ambulatory Visit: Payer: Self-pay

## 2020-05-19 ENCOUNTER — Ambulatory Visit: Payer: 59 | Attending: Obstetrics and Gynecology | Admitting: Physical Therapy

## 2020-05-19 ENCOUNTER — Encounter: Payer: Self-pay | Admitting: Physical Therapy

## 2020-05-19 DIAGNOSIS — R278 Other lack of coordination: Secondary | ICD-10-CM

## 2020-05-19 DIAGNOSIS — M6281 Muscle weakness (generalized): Secondary | ICD-10-CM | POA: Insufficient documentation

## 2020-05-19 NOTE — Therapy (Signed)
[facility] [address] [place], [place], [address] Phone: [phone]   Fax:  [phone]  Physical Therapy Evaluation  Patient Details  Name: JATASIA GUNDRUM MRN: [id number] Date of Birth: Dec 12, 1980 Referring Provider (PT): Benjaman Kindler   Encounter Date: 05/19/2020   PT End of Session - 05/19/20 0805    Visit Number 1    Number of Visits 8    Date for PT Re-Evaluation 07/14/20    PT Start Time 0805    PT Stop Time 6144    PT Time Calculation (min) 50 min    Activity Tolerance Patient tolerated treatment well    Behavior During Therapy [facility] for tasks assessed/performed           Past Medical History:  Diagnosis Date  . Anemia    during pregnancy  . Carpal tunnel syndrome, bilateral   . Collagen vascular disease ([place])   . Complication of anesthesia    states gets emotional after anesthesia  . Fibromyalgia   . GERD (gastroesophageal reflux disease)   . History of kidney stones    h/o  . Hypertension    states under control with meds., has been on med. since 2005  . Menorrhagia 03/2018  . Migraines    migraines  . Nephritis 1995   Glomerular nephritis. Age 39.  No issues since.  . Rheumatoid arthritis [facility])     Past Surgical History:  Procedure Laterality Date  . CARPAL TUNNEL RELEASE Right 04/23/2018   Procedure: ENDOSCOPIC RIGHT CARPAL TUNNEL RELEASE;  Surgeon: Corky Mull, MD;  Location: [place];  Service: Orthopedics;  Laterality: Right;  . CHOLECYSTECTOMY  12/26/2013  . CYSTOCELE REPAIR N/A 08/28/2016   Procedure: ANTERIOR REPAIR (CYSTOCELE);  Surgeon: Emily Filbert, MD;  Location: [place] [facility];  Service: Gynecology;  Laterality: N/A;  . CYSTOCELE REPAIR N/A 12/04/2019   Procedure: ANTERIOR REPAIR (CYSTOCELE);  Surgeon: Benjaman Kindler, MD;  Location: [facility];  Service: Gynecology;  Laterality: N/A;  . DILATION AND EVACUATION  03/09/2011   Procedure: DILATATION AND EVACUATION (D&E);  Surgeon:  Donnamae Jude, MD;  Location: [place] [facility];  Service: Gynecology;  Laterality: N/A;  . ENDOMETRIAL ABLATION    . HYSTEROSCOPY WITH D & C N/A 03/19/2018   Procedure: DILATION AND CURETTAGE /HYSTEROSCOPY WITH MINERVA ABLATION;  Surgeon: Donnamae Jude, MD;  Location: [place];  Service: Gynecology;  Laterality: N/A;  . LAPAROSCOPIC BILATERAL SALPINGECTOMY Bilateral 08/28/2016   Procedure: LAPAROSCOPIC BILATERAL SALPINGECTOMY;  Surgeon: Emily Filbert, MD;  Location: [place] [facility];  Service: Gynecology;  Laterality: Bilateral;  . PERINEOPLASTY N/A 08/28/2016   Procedure: PERINEOPLASTY;  Surgeon: Emily Filbert, MD;  Location: [place] [facility];  Service: Gynecology;  Laterality: N/A;  . TONSILLECTOMY  1988  . TYMPANOSTOMY TUBE PLACEMENT    . VAGINAL HYSTERECTOMY N/A 12/04/2019   Procedure: HYSTERECTOMY VAGINAL, CULDOPLASTY;  Surgeon: Benjaman Kindler, MD;  Location: [facility];  Service: Gynecology;  Laterality: N/A;    There were no vitals filed for this visit.        [facility] PT Assessment - 05/19/20 0803      Assessment   Medical Diagnosis Cystocele, rectocele    Referring Provider (PT) Benjaman Kindler    Onset Date/Surgical Date 12/04/19    Hand Dominance Right    Prior Therapy None for this dx      Balance Screen   Has the patient fallen in the past 6 months No  [facility] PHYSICAL THERAPY EVALUATION  SCREENING Red Flags: None Have you had any night sweats? Unexplained weight loss? Saddle anesthesia? Unexplained changes in bowel or bladder habits?  Precautions: None  SUBJECTIVE  Chief Complaint: Patient works at [employer] and notes that at times she has to lift > 75 lbs. She has had 2 anterior repairs of cystocele with one perineoplasty. After third delivery, patient noticed onset of symptoms for prolapse but it resolved somewhat. After the 4th delivery, she had increased onset symptoms where she had tissue falling out and sticking to her undergarments. She had pain and pressure  and a bulge with toileting. Patient was staged as grade 4 cystocele at the time and had a repair and perineoplasty (2018). Patient had bout of bronchitis after surgery which she feels impacted her surgical outcomes. In 2019 patient had ablation to deal with heavy bleeding. Patient was dealing with pelvic pain and bleeding through 2020 and was monitored by GYN until she needed to have hysterectomy and second cystocele repair. Patient returned to work 6 weeks after and was lifting her child (30lb). Patient had Covid in September which she felt also impacted her surgical outcomes. GYN stated that prolapse has returned but not as bad as it was (grade 2 per MD note).   Patient reports that as a child she was highly flexible and has always been very mobile. Patient adds that her mother and sister also deal with similar concerns with respect to pelvic organ support.  Patient has tried pessaries and did not like them; is not interested in that option.   Pertinent History:  Falls Negative.  Scoliosis Negative. Pulmonary disease/dysfunction Negative. Surgical history: Positive for see above.   Recent Procedures/Tests/Findings: none  Obstetrical History: G6P4 Deliveries: vaginal Tearing/Episiotomy: G1, small tear (3 stitches) Birthing position: back  Gynecological History: Hysterectomy: Yes Vaginal Endometriosis: Negative. Pain with exam: No   Urinary History: Incontinence: Positive. Onset: 2018 Triggers: urge, standing after toileting, occasional during sleep. Amount: Min/Mod.  Fluid Intake: 2-3 16 oz H20, 2-3 diet sodas Nocturia: 1x/night Frequency of urination: 9-12x/day Pain with urination: Negative Difficulty initiating urination: Negative; Intermittent stream: Negative. Frequent UTI: Negative.   Gastrointestinal History: [place] Stool Chart: Type 4, 5, 6 Frequency of BMs: 1x/day Pain with defecation: Negative Straining with defecation: Negative Incontinence: Positive for rare  occurrence. Triggers: urgency Amount: Min.  Sexual activity/pain: Pain with intercourse: Positive.   Initial penetration: Yes  Deep thrustingYes   Location of pain: generalized (RA) Current pain:  2/10  Max pain:  3/10 Least pain:  1-2/10 Pain quality: pain quality: aching Radiating pain: No    OBJECTIVE  Mental Status Patient is oriented to person, place and time.  Recent memory is intact.  Remote memory is intact.  Attention span and concentration are intact.  Expressive speech is intact.  Patient's fund of knowledge is within normal limits for educational level.  POSTURE/OBSERVATIONS:  Lumbar lordosis: appearing WNL Iliac crest height: appearing equal bilaterally Pelvic obliquity: none apparent Leg length discrepancy: not grossly apparent  GAIT: Grossly WNL  RANGE OF MOTION: deferred 2/2 to time constraints; no gross deficits apparent   LEFT RIGHT  Lumbar forward flexion (65):      Lumbar extension (30):     Lumbar lateral flexion (25):     Thoracic and Lumbar rotation (30 degrees):       Hip Flexion (0-125):      Hip IR (0-45):     Hip ER (0-45):     Hip Abduction (0-40):  Hip extension (0-15):       STRENGTH: MMT deferred 2/2 to time constraints; no gross deficits apparent  RLE LLE  Hip Flexion    Hip Extension    Hip Abduction     Hip Adduction     Hip ER     Hip IR     Knee Extension    Knee Flexion    Dorsiflexion     Plantarflexion (seated)     ABDOMINAL: deferred 2/2 to time constraints Palpation: Diastasis: Scar mobility: Rib flare:  SPECIAL TESTS: deferred 2/2 to time constraints   PHYSICAL PERFORMANCE MEASURES: STS:  Deep Squat: RLE STS: LLE STS:  6MWT:   EXTERNAL PELVIC EXAM: deferred 2/2 to time constraints Palpation: Breath coordination: Cued Lengthen: Cued Contraction: Cough:  INTERNAL VAGINAL EXAM: deferred 2/2 to time constraints Introitus Appears:  Skin integrity:  Scar mobility: Strength (PERF):   Symmetry: Palpation: Prolapse:   OUTCOME MEASURES: FOTO Urinary 52   ASSESSMENT Patient is a 39 year old presenting to clinic with chief complaints of urinary urgency, urge incontinence, pelvic organ support, and pain with intercourse. Upon examination, patient demonstrates deficits in PFM coordination, PFM strength, IAP management, and pain as evidenced by pain with intercourse, decreased sexual desire, urinary leakage with urge components and stress components, as well as increased pelvic pressure with lifting, straining, coughing. Patient's responses on FOTO outcome measures (52) indicate moderate functional limitations/disability/distress. Patient's progress may be limited due to extensive history of pelvic organ support complications; however, patient's motivation is advantageous. Patient was able to achieve basic understanding IAPs during today's evaluation and responded positively to educational interventions. Patient will benefit from continued skilled therapeutic intervention to address deficits in PFM coordination, PFM strength, IAP management, and pain in order to increase function and improve overall QOL.  EDUCATION Patient educated on prognosis, POC, and provided with HEP including: bladder diary. Patient articulated understanding and returned demonstration. Patient will benefit from further education in order to maximize compliance and understanding for long-term therapeutic gains.  TREATMENT Neuromuscular Re-education: Patient educated on primary functions of the pelvic floor including: posture/balance, sexual pleasure, storage and elimination of waste from the body, abdominal cavity closure, and breath coordination. Patient education on basics of pressure management; will require further education.         Objective measurements completed on examination: See above findings.                    PT Long Term Goals - 05/19/20 1408      PT LONG TERM GOAL #1    Title Patient will demonstrate independence with HEP in order to maximize therapeutic gains and improve carryover from physical therapy sessions to ADLs in the home and community.    Baseline IE: not initiated    Time 8    Period Weeks    Status New    Target Date 07/14/20      PT LONG TERM GOAL #2   Title Patient will demonstrate improved function as evidenced by a score of 62 on FOTO measure for full participation in activities at home and in the community.    Baseline IE: 52    Time 8    Period Weeks    Status New    Target Date 07/14/20      PT LONG TERM GOAL #3   Title Patient will demonstrate circumferential and sequential contraction of >4/5 MMT, > 6 sec hold x10 and 5 consecutive quick flicks with </= 10 min rest  between testing bouts, and relaxation of the PFM coordinated with breath for improved management of intra-abdominal pressure and normal bowel and bladder function without the presence of pain nor incontinence in order to improve participation at home and in the community.    Baseline IE: not demonstrated    Time 8    Period Weeks    Status New    Target Date 07/14/20      PT LONG TERM GOAL #4   Title Patient will report less than 7 incidents of stress urinary incontinence over the course of 2 weeks when standing after urination in order to demonstrate improved PFM coordination, strength, and function for improved overall QOL.    Baseline IE: 100% of urinations/day (>60/week)    Time 8    Period Weeks    Status New    Target Date 07/14/20                  Plan - 05/19/20 0806    Clinical Impression Statement Patient is a 39 year old presenting to clinic with chief complaints of urinary urgency, urge incontinence, pelvic organ support, and pain with intercourse. Upon examination, patient demonstrates deficits in PFM coordination, PFM strength, IAP management, and pain as evidenced by pain with intercourse, decreased sexual desire, urinary leakage with urge  components and stress components, as well as increased pelvic pressure with lifting, straining, coughing. Patient's responses on FOTO outcome measures (52) indicate moderate functional limitations/disability/distress. Patient's progress may be limited due to extensive history of pelvic organ support complications; however, patient's motivation is advantageous. Patient was able to achieve basic understanding IAPs during today's evaluation and responded positively to educational interventions. Patient will benefit from continued skilled therapeutic intervention to address deficits in PFM coordination, PFM strength, IAP management, and pain in order to increase function and improve overall QOL.    Personal Factors and Comorbidities Behavior Pattern;Comorbidity 3+;Past/Current Experience;Time since onset of injury/illness/exacerbation    Comorbidities s/p hysterectomy, RA, anxiety and depression, HTN, dyspareunia, fibromyalgia, migraine, collagen vascular disease    Examination-Activity Limitations Lift;Squat;Carry;Continence;Reach Overhead;Transfers    Examination-Participation Restrictions Interpersonal Relationship;Laundry;Cleaning;Yard Work    [employer] Evolving/Moderate complexity    Clinical Decision Making Moderate    Rehab Potential Fair    PT Frequency 1x / week    PT Duration 8 weeks    PT Treatment/Interventions Moist Heat;Cryotherapy;Electrical Stimulation;Therapeutic exercise;Therapeutic activities;Neuromuscular re-education;Patient/family education;Manual techniques;Taping    PT Next Visit Plan physical assessment; IAP management basics    PT Home Exercise Plan bladder diary    Consulted and Agree with Plan of Care Patient           Patient will benefit from skilled therapeutic intervention in order to improve the following deficits and impairments:  Decreased endurance, Decreased coordination, Decreased strength, Improper body mechanics, Hypermobility,  Pain  Visit Diagnosis: Muscle weakness (generalized)  Other lack of coordination     Problem List Patient Active Problem List   Diagnosis Date Noted  . Menorrhagia with regular cycle 01/09/2018  . Bilateral carpal tunnel syndrome 11/13/2017  . Elevated liver enzymes 04/02/2017  . Encounter for long-term (current) use of high-risk medication 02/26/2017  . Seropositive rheumatoid arthritis ([place]) 02/26/2017  . Vitamin D deficiency 02/26/2017  . Chronic fatigue and malaise 02/13/2017  . Anxiety, generalized 07/27/2016  . Hypertriglyceridemia 06/17/2014  . HTN (hypertension), benign 02/13/2011   Myles Gip PT, DPT [phone]  05/19/2020, 2:12 PM  [facility] [address] [place],  [place], [address] Phone: [phone]   Fax:  [phone]  Name: DALESHA STANBACK MRN: [id number] Date of Birth: May 03, 1981

## 2020-05-31 ENCOUNTER — Ambulatory Visit: Payer: 59 | Admitting: Physical Therapy

## 2020-06-07 ENCOUNTER — Ambulatory Visit: Payer: 59 | Attending: Obstetrics and Gynecology | Admitting: Physical Therapy

## 2020-06-14 ENCOUNTER — Encounter: Payer: 59 | Admitting: Physical Therapy

## 2020-06-15 ENCOUNTER — Encounter: Payer: 59 | Admitting: Dermatology

## 2020-06-20 ENCOUNTER — Encounter: Payer: 59 | Admitting: Physical Therapy

## 2020-06-28 ENCOUNTER — Encounter: Payer: 59 | Admitting: Physical Therapy

## 2020-08-09 ENCOUNTER — Other Ambulatory Visit: Payer: Self-pay | Admitting: Internal Medicine

## 2020-08-09 DIAGNOSIS — R140 Abdominal distension (gaseous): Secondary | ICD-10-CM

## 2020-08-17 ENCOUNTER — Other Ambulatory Visit: Payer: Self-pay

## 2020-08-17 ENCOUNTER — Ambulatory Visit
Admission: RE | Admit: 2020-08-17 | Discharge: 2020-08-17 | Disposition: A | Discharge status: Home / Self Care | Payer: 59 | Source: Ambulatory Visit | Attending: Internal Medicine | Admitting: Internal Medicine

## 2020-08-17 DIAGNOSIS — R140 Abdominal distension (gaseous): Secondary | ICD-10-CM | POA: Diagnosis present

## 2020-08-17 IMAGING — CT CT ABD-PELV W/O CM
1 of 2 series · 15 of 32 positions shown, 19 images · non-contrast
Comparison: [DATE]

CLINICAL DATA: Bloating and abdominal distention.

EXAM:
CT ABDOMEN AND PELVIS WITHOUT CONTRAST
TECHNIQUE: Multidetector CT imaging of the abdomen and pelvis was performed
following the standard protocol without IV contrast.

[Series 2: axial st · axial · 0.76mm/px · z∈[-1143,-683]mm · 15 of 102 slices shown, 19 images]
[im 5/102  soft-tissue]
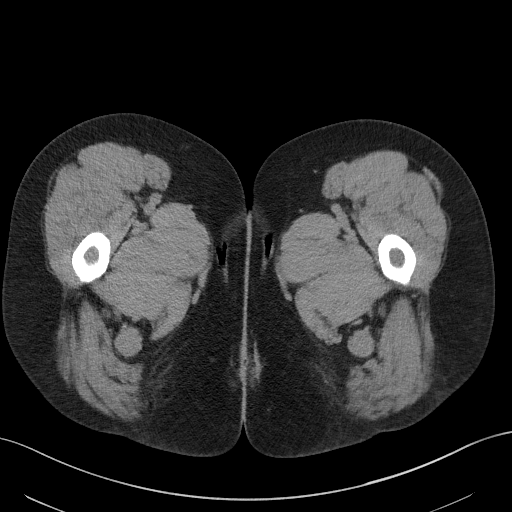
[im 5/102  bone]
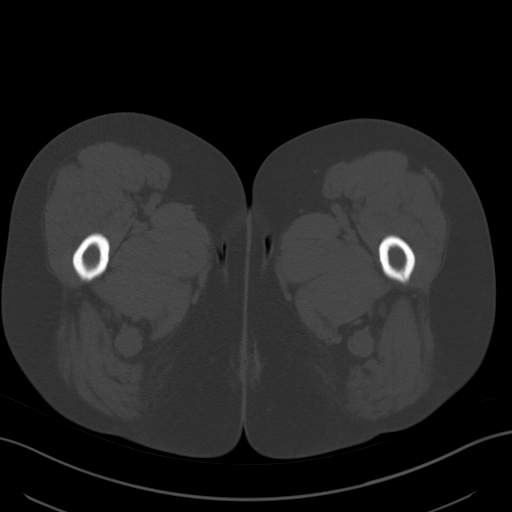
[im 13/102  soft-tissue]
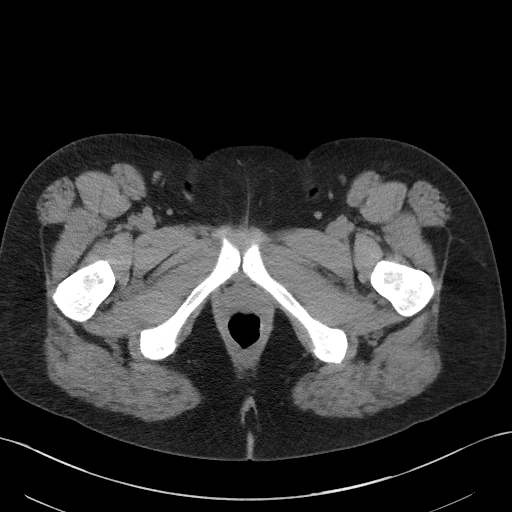
[im 22/102  soft-tissue]
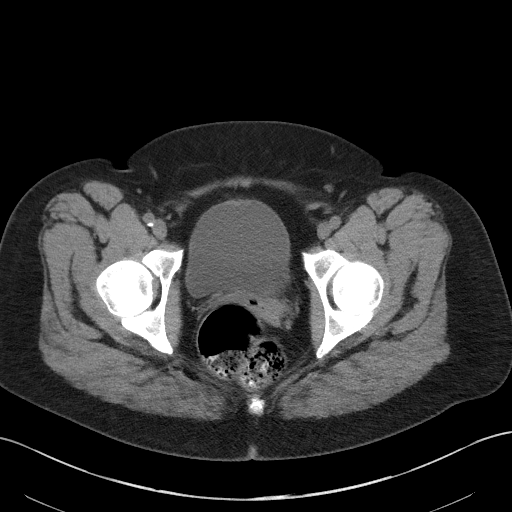
[im 30/102  soft-tissue]
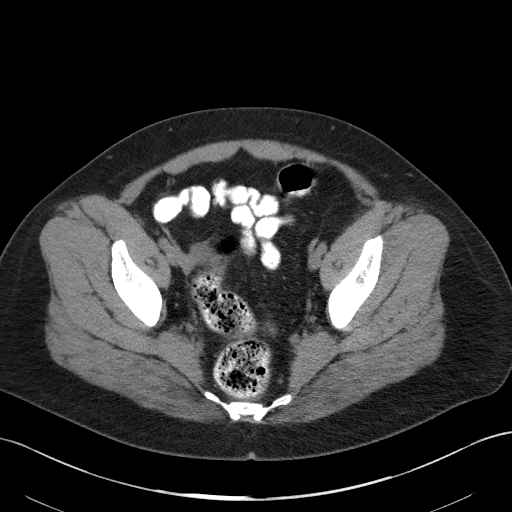
[im 34/102  soft-tissue]
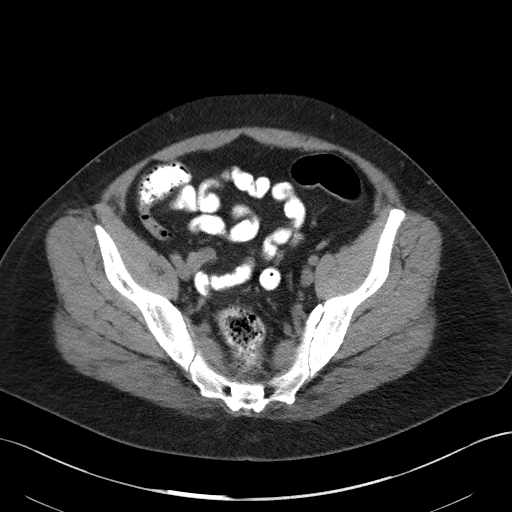
[im 43/102  soft-tissue]
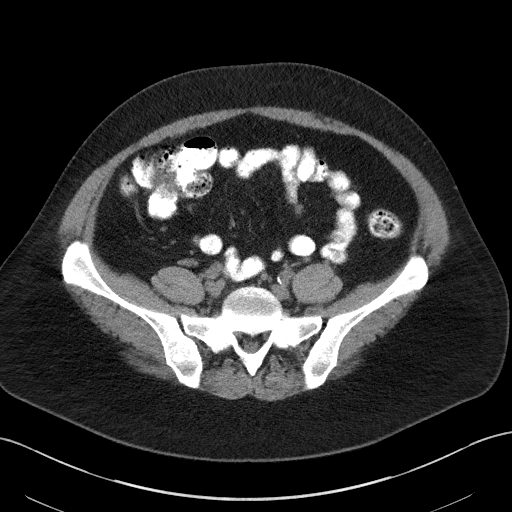
[im 51/102  soft-tissue]
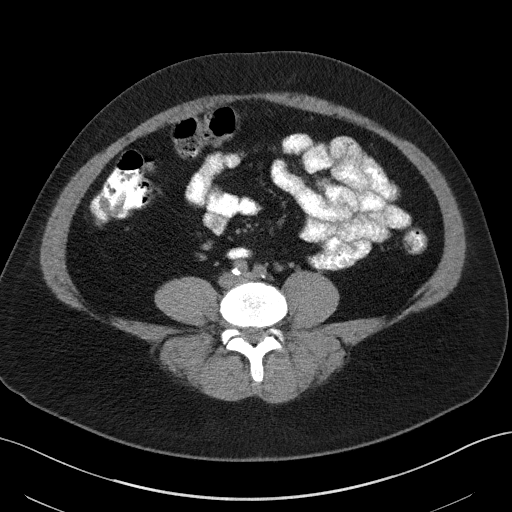
[im 59/102  soft-tissue]
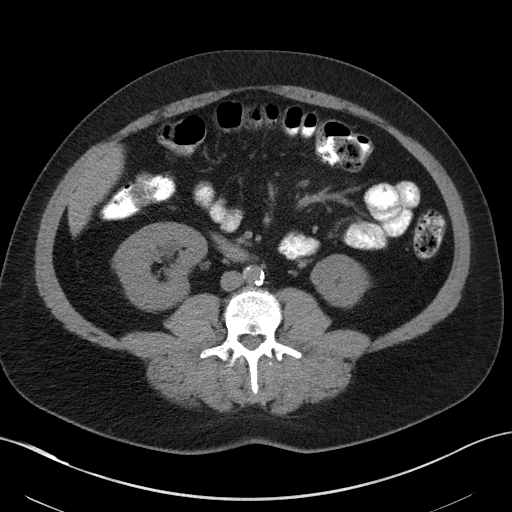
[im 68/102  soft-tissue]
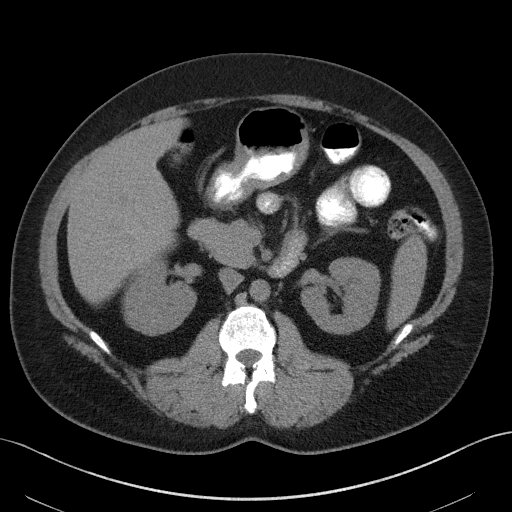
[im 68/102  bone]
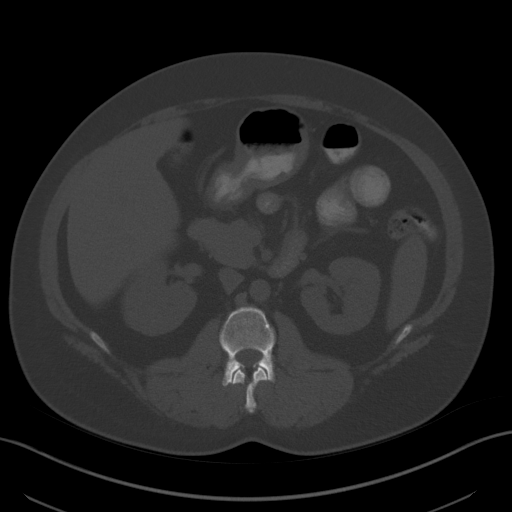
[im 72/102  soft-tissue]
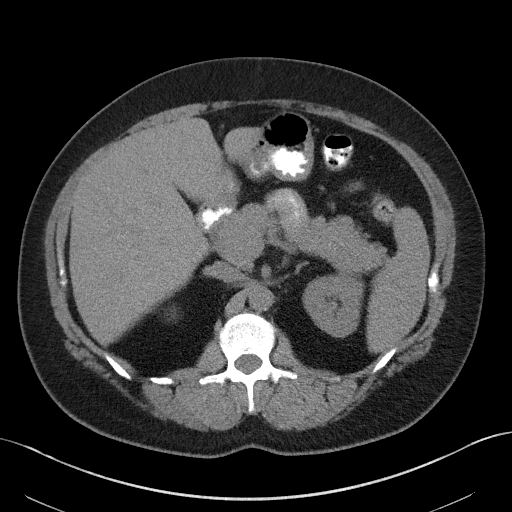
[im 80/102  soft-tissue]
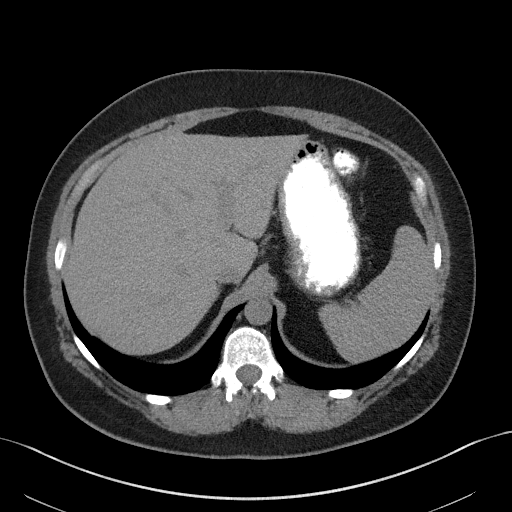
[im 85/102  lung]
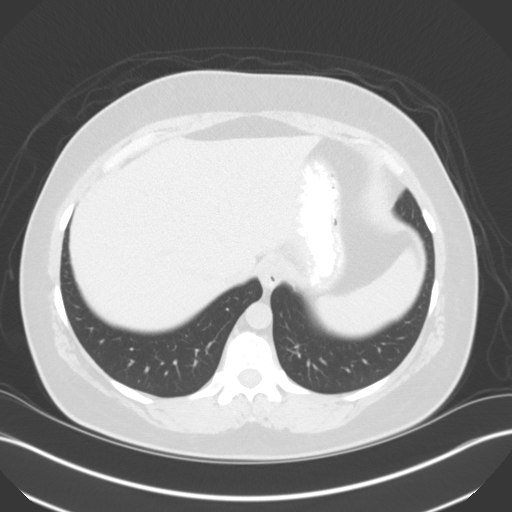
[im 89/102  soft-tissue]
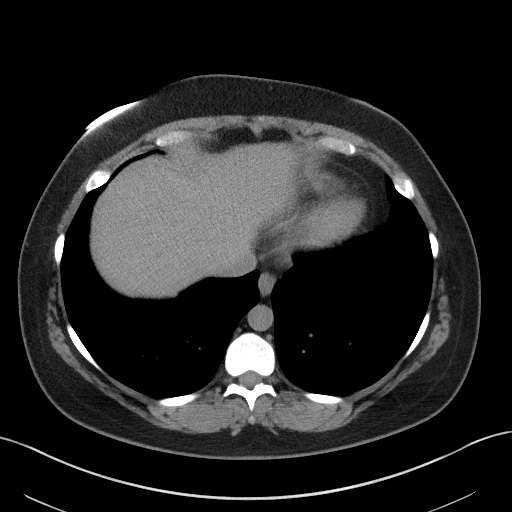
[im 89/102  lung]
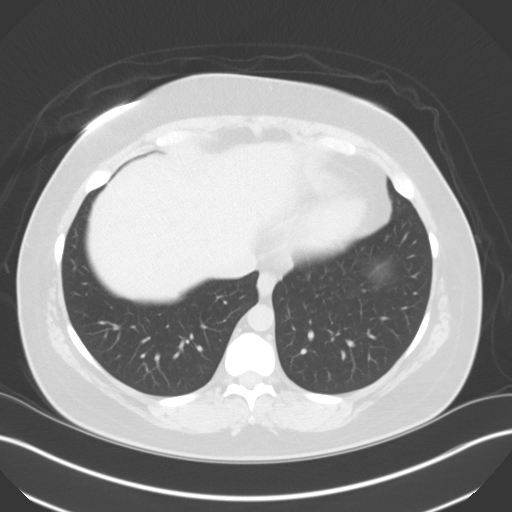
[im 93/102  lung]
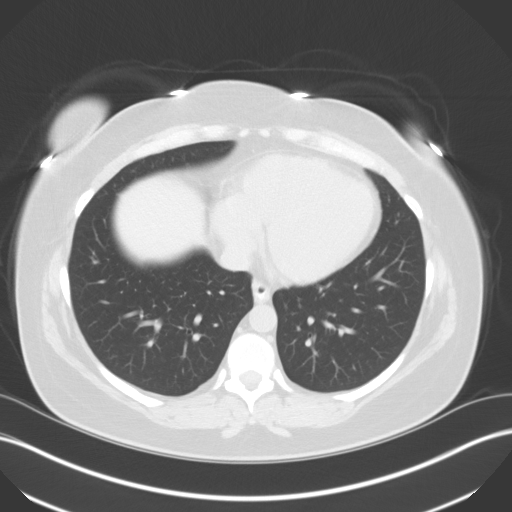
[im 97/102  soft-tissue]
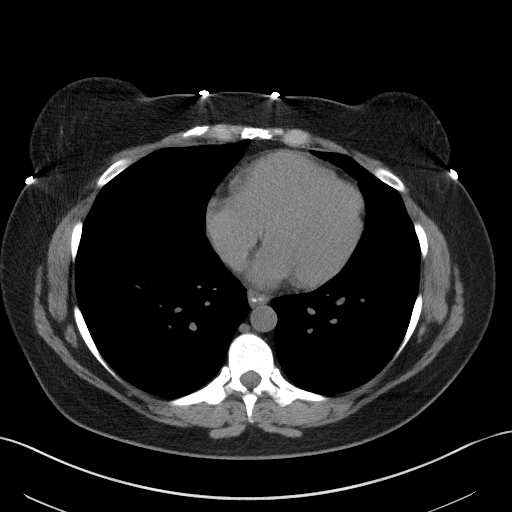
[im 97/102  lung]
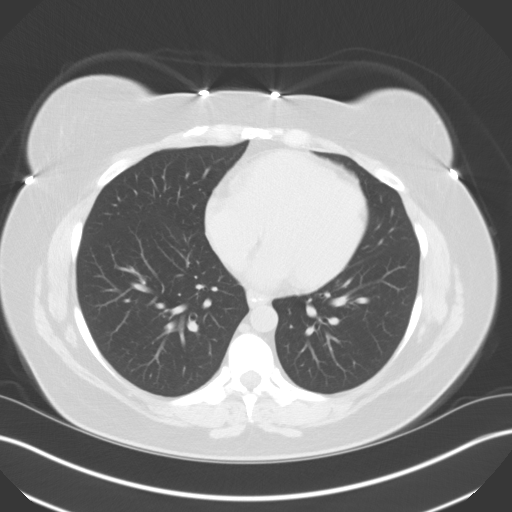

[15 of 32 positions shown; findings below may reference images not displayed]

FINDINGS: Lower chest: The lung bases are clear. The heart size is normal.

Hepatobiliary: The liver is normal. Status post
cholecystectomy.There is no biliary ductal dilation.

Pancreas: Normal contours without ductal dilatation. No
peripancreatic fluid collection.

Spleen: Unremarkable.

Adrenals/Urinary Tract:

--Adrenal glands: Unremarkable.

--Right kidney/ureter: No hydronephrosis or radiopaque kidney
stones.

--Left kidney/ureter: There is a punctate nonobstructing stone in
the lower pole the left kidney.

--Urinary bladder: Unremarkable.

Stomach/Bowel:

--Stomach/Duodenum: No hiatal hernia or other gastric abnormality.
Normal duodenal course and caliber.

--Small bowel: Unremarkable.

--Colon: Unremarkable.

--Appendix: Normal.

Vascular/Lymphatic: Atherosclerotic calcification is present within
the non-aneurysmal abdominal aorta, without hemodynamically
significant stenosis.

--No retroperitoneal lymphadenopathy.

--No mesenteric lymphadenopathy.

--No pelvic or inguinal lymphadenopathy.

Reproductive: Status post hysterectomy. No adnexal mass.

Other: No ascites or free air. The abdominal wall is normal.

Musculoskeletal. No acute displaced fractures.
IMPRESSION: 1. No acute abdominopelvic abnormality.
2. Status post cholecystectomy and hysterectomy.
3. Punctate nonobstructing stone in the lower pole the left kidney.
4. Again noted are atherosclerotic changes of the abdominal aorta,
much greater than expected for a patient of this age.

Aortic Atherosclerosis ([7B]-[7B]).

## 2020-09-07 ENCOUNTER — Other Ambulatory Visit: Payer: Self-pay

## 2020-09-07 ENCOUNTER — Ambulatory Visit (INDEPENDENT_AMBULATORY_CARE_PROVIDER_SITE_OTHER): Payer: 59 | Admitting: Dermatology

## 2020-09-07 DIAGNOSIS — L710 Perioral dermatitis: Secondary | ICD-10-CM | POA: Diagnosis not present

## 2020-09-07 MED ORDER — ACZONE 7.5 % EX GEL: 1.0000 "application " | Freq: Every day | CUTANEOUS | 3 refills | Status: DC

## 2020-09-07 MED ORDER — PIMECROLIMUS 1 % EX CREA: TOPICAL_CREAM | Freq: Every morning | CUTANEOUS | 2 refills | Status: AC

## 2020-09-07 NOTE — Progress Notes (Signed)
   Follow-Up Visit   Subjective  [name] is a 40 y.o. female who presents for the following: perioral derm (Perioral, pt tried dapsone 7.5% burned, didn't help, but the name brand samples of Aczone we gave her did help). Pt last seen over 1 year ago.  The following portions of the chart were reviewed this encounter and updated as appropriate:   Tobacco  Allergies  Meds  Problems  Med Hx  Surg Hx  Fam Hx     Review of Systems:  No other skin or systemic complaints except as noted in HPI or Assessment and Plan.  Objective  Well appearing patient in no apparent distress; mood and affect are within normal limits.  A focused examination was performed including face. Relevant physical exam findings are noted in the Assessment and Plan.  Objective  face: Pink plaques perioral  Images       Assessment & Plan  Perioral dermatitis face Chronic, persistent Restart Aczone 7.5% gel qhs (name brand sent in) Start Elidel qam  Discussed adding oral doxycycline if not improving. Discussed Skin Medicinals Triple Rosacea cream  ACZONE 7.5 % GEL - face  pimecrolimus (ELIDEL) 1 % cream - face  Return in about 4 weeks (around 10/05/2020).   I, Othelia Pulling, RMA, am acting as scribe for Sarina Ser, MD .  Documentation: I have reviewed the above documentation for accuracy and completeness, and I agree with the above.  Sarina Ser, MD

## 2020-09-07 NOTE — Patient Instructions (Signed)
Perioral dermatitis  Perioral dermatitis is an eruption which is usually located around the mouth and nose.  It can be a rash and/or red bumps.  It occasionally occurs around the eyes.  It may be itchy and may burn.  The exact cause is unknown.  Some types of makeup, moisturizers, dental products, and prescription creams may be partially responsible for the eruption.  Topical steroids such as cortisone creams can temporarily make the rash better but with discontinuation the rash tends to recur and worsen.  If you have been using topical steroids, your dermatologist may need to gradually taper the strength of steroids.  Topical antibiotics, elidel cream, protopic ointment, and oral antiobiotics may be prescribed to treat this condition.  Although perioral dermatitis is not an infection, some antibiotics have anti-inflammatory properties that help it greatly.  

## 2020-09-08 ENCOUNTER — Encounter: Payer: Self-pay | Admitting: Dermatology

## 2020-10-03 ENCOUNTER — Other Ambulatory Visit: Payer: Self-pay

## 2020-10-03 ENCOUNTER — Other Ambulatory Visit: Payer: Self-pay | Admitting: Internal Medicine

## 2020-10-03 ENCOUNTER — Ambulatory Visit
Admission: RE | Admit: 2020-10-03 | Discharge: 2020-10-03 | Disposition: A | Discharge status: Home / Self Care | Payer: 59 | Source: Ambulatory Visit | Attending: Internal Medicine | Admitting: Internal Medicine

## 2020-10-03 DIAGNOSIS — R200 Anesthesia of skin: Secondary | ICD-10-CM

## 2020-10-03 DIAGNOSIS — R270 Ataxia, unspecified: Secondary | ICD-10-CM | POA: Insufficient documentation

## 2020-10-03 IMAGING — MR MR HEAD WO/W CM
13 series · 45 of 48 positions shown · IV contrast (gadavist)
Comparison: Prior radiograph from [DATE].

CLINICAL DATA: Initial evaluation for 5 day history of left greater
than right facial, shoulder, arm, and hand numbness, dizziness.

EXAM:
MRI HEAD WITHOUT AND WITH CONTRAST
MRI CERVICAL SPINE WITHOUT CONTRAST
TECHNIQUE: Multiplanar, multiecho pulse sequences of the brain and surrounding
structures, and cervical spine, to include the craniocervical
junction and cervicothoracic junction, were obtained without and
with intravenous contrast.
CONTRAST:  8mL GADAVIST GADOBUTROL 1 MMOL/ML IV SOLN

[Series 5: ax dwi_tracew · axial · 3.0mm · 0.65mm/px · z∈[-117,+37]mm · 4 of 48 slices shown]
[im 1/48]
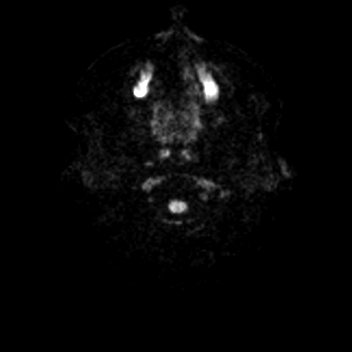
[im 16/48]
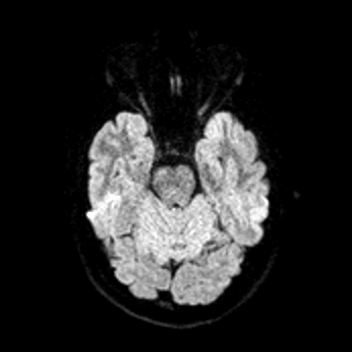
[im 32/48]
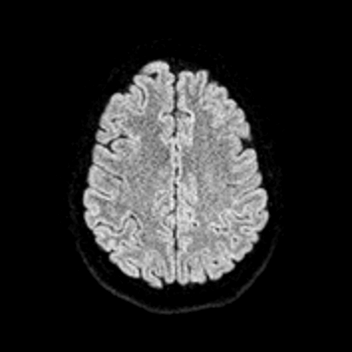
[im 48/48]
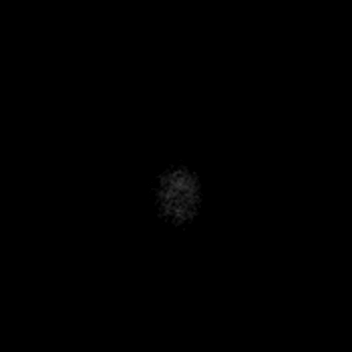

[Series 6: ax dwi_adc · axial · 3.0mm · 0.65mm/px · z∈[-117,+37]mm · 4 of 48 slices shown]
[im 1/48]
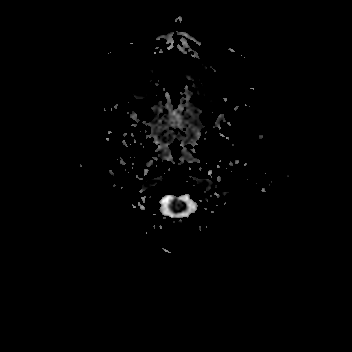
[im 16/48]
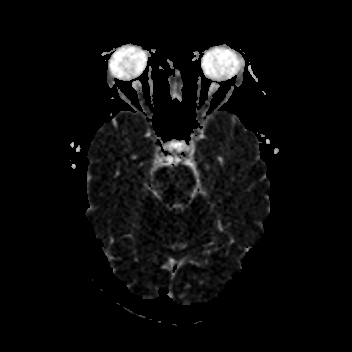
[im 32/48]
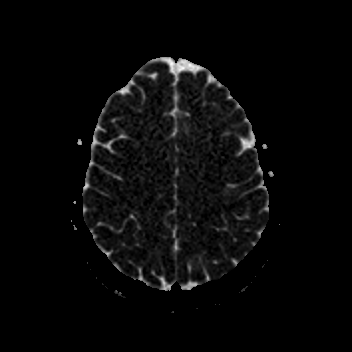
[im 48/48]
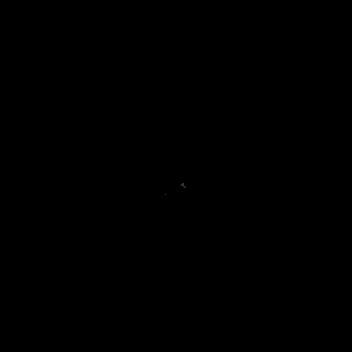

[Series 7: cor dwi_tracew · coronal · 5.0mm · 0.65mm/px · 2 of 40 slices shown]
[im 1/40]
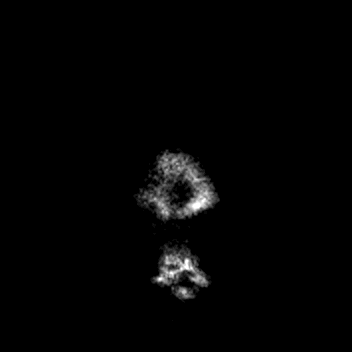
[im 40/40]
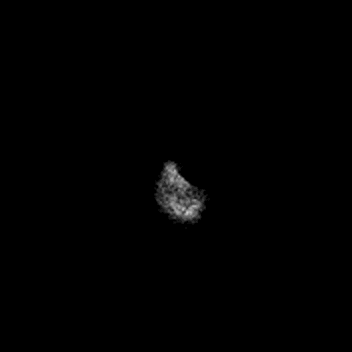

[Series 8: cor dwi_adc · coronal · 5.0mm · 0.65mm/px · 2 of 39 slices shown]
[im 1/39]
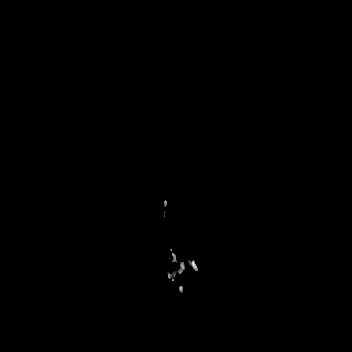
[im 39/39]
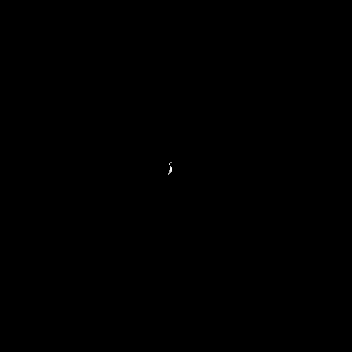

[Series 9: T1 · sagittal · 5.0mm · 0.62mm/px · 1 of 25 slices shown (1 of 2)]
[im 1/25]
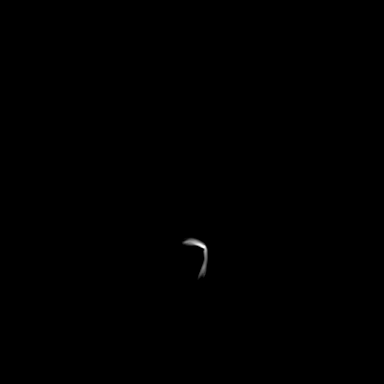

[Series 10: T2 · axial · 5.0mm · 0.53mm/px · 1 of 25 slices shown]
[im 1/25]
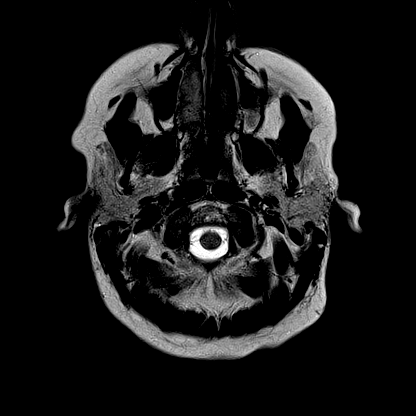

[Series 11: FLAIR · axial · 3.0mm · 0.53mm/px · z∈[-120,+41]mm · 3 of 55 slices shown]
[im 1/55]
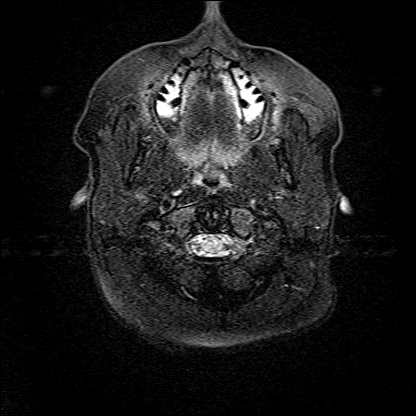
[im 28/55]
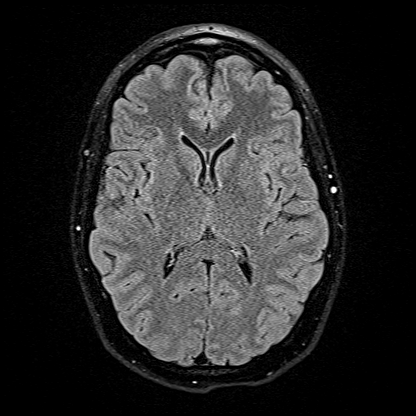
[im 55/55]
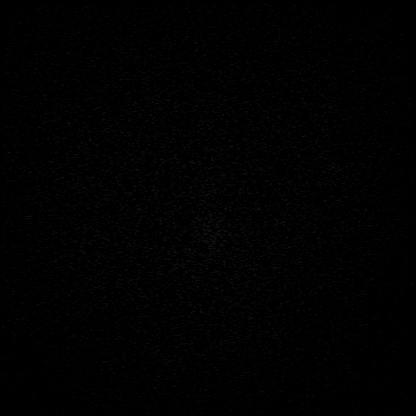

[Series 12: T1 · axial · 1.0mm · 0.98mm/px · z∈[-128,+47]mm · 8 of 176 slices shown (2 of 2)]
[im 1/176]
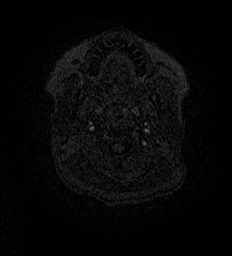
[im 20/176]
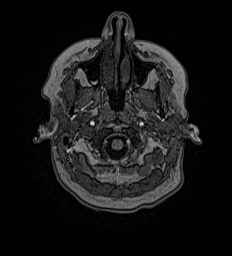
[im 59/176]
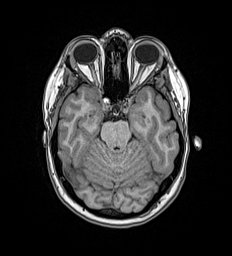
[im 78/176]
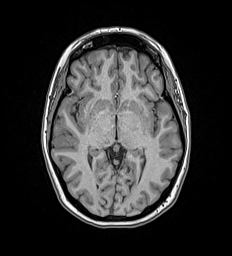
[im 98/176]
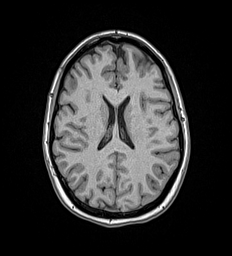
[im 117/176]
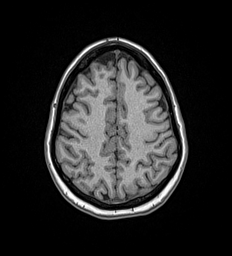
[im 156/176]
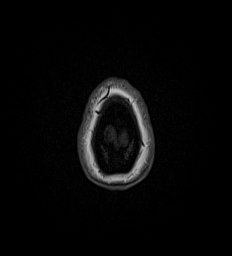
[im 176/176]
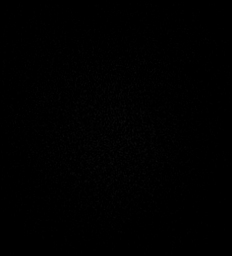

[Series 14: pha_images · axial · 3.0mm · 0.90mm/px · z∈[-128,+39]mm · 3 of 57 slices shown]
[im 1/57]
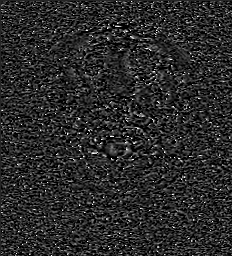
[im 29/57]
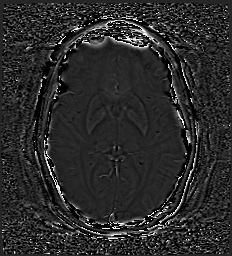
[im 57/57]
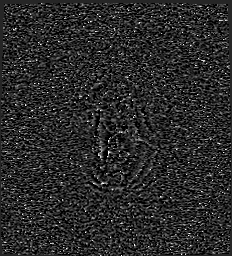

[Series 15: swi_images · axial · 3.0mm · 0.90mm/px · z∈[-128,+48]mm · 4 of 60 slices shown]
[im 1/60]
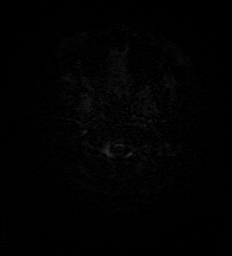
[im 20/60]
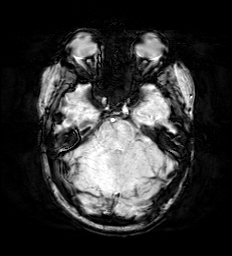
[im 40/60]
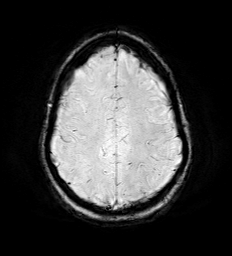
[im 60/60]
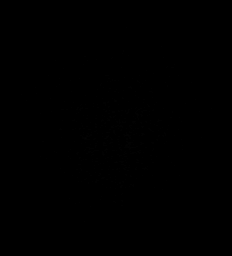

[Series 17: T2 post-contrast · coronal · 5.0mm · 0.57mm/px · 2 of 29 slices shown]
[im 1/29]
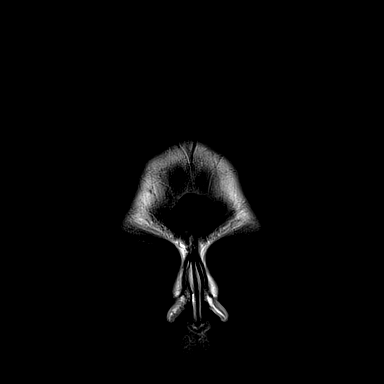
[im 29/29]
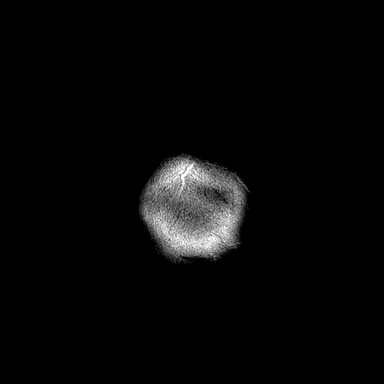

[Series 27: T1 post-contrast · axial · 1.0mm · 0.98mm/px · z∈[-128,+47]mm · 9 of 176 slices shown (1 of 2)]
[im 1/176]
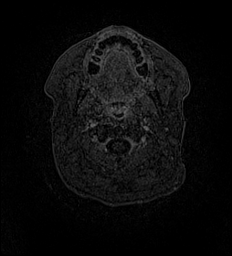
[im 20/176]
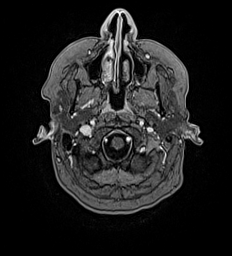
[im 39/176]
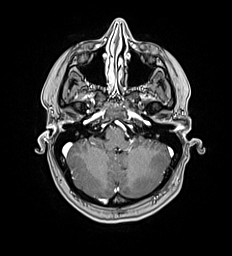
[im 59/176]
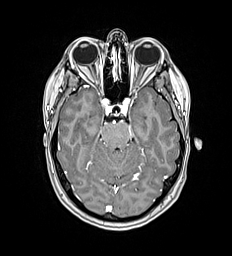
[im 78/176]
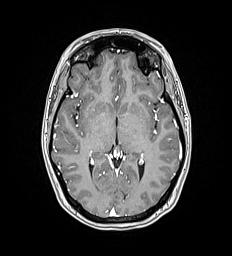
[im 98/176]
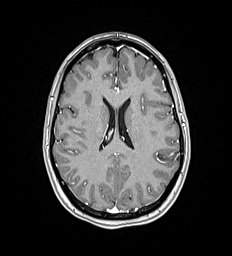
[im 117/176]
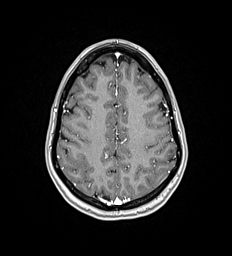
[im 156/176]
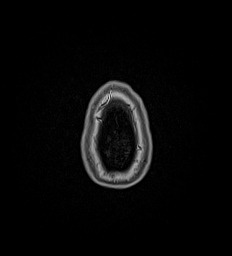
[im 176/176]
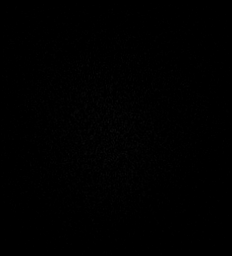

[Series 28: T1 post-contrast · coronal · 5.0mm · 0.57mm/px · 2 of 29 slices shown (2 of 2)]
[im 1/29]
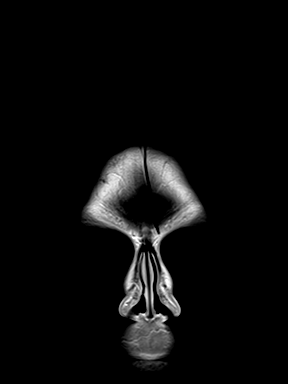
[im 29/29]
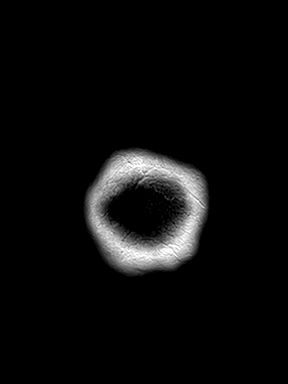

[45 of 48 positions shown; findings below may reference images not displayed]

FINDINGS: MRI HEAD FINDINGS

Brain: Cerebral volume within normal limits for patient age. No
focal parenchymal signal abnormality identified.

No abnormal foci of restricted diffusion to suggest acute or
subacute ischemia. Gray-white matter differentiation well
maintained. No encephalomalacia to suggest chronic infarction. No
foci of susceptibility artifact to suggest acute or chronic
intracranial hemorrhage.

No mass lesion, midline shift or mass effect. No hydrocephalus. No
extra-axial fluid collection. Major dural sinuses are grossly
patent.

Pituitary gland and suprasellar region are normal. Midline
structures intact and normal.

No abnormal enhancement.

Vascular: Major intracranial vascular flow voids well maintained and
normal in appearance.

Skull and upper cervical spine: Craniocervical junction normal.
Visualized upper cervical spine within normal limits. Bone marrow
signal intensity normal. No scalp soft tissue abnormality.

Sinuses/Orbits: Globes and orbital soft tissues within normal
limits.

Paranasal sinuses are clear. No mastoid effusion. Inner ear
structures normal.

Other: None.

MRI CERVICAL SPINE FINDINGS

Alignment: Straightening of the normal cervical lordosis. No
listhesis or static subluxation.

Vertebrae: Vertebral body height well maintained without acute or
chronic fracture. Bone marrow signal intensity within normal limits.
No discrete or worrisome osseous lesions. No abnormal marrow edema.

Cord: Normal signal and morphology.

Posterior Fossa, vertebral arteries, paraspinal tissues: Visualized
brain and posterior fossa within normal limits. Craniocervical
junction normal. Paraspinous and prevertebral soft tissues within
normal limits. Normal intravascular flow voids seen within the
vertebral arteries bilaterally.

Disc levels:

C2-C3: Unremarkable.

C3-C4: Small central disc protrusion mildly indents the ventral
thecal sac contacting and minimally flattening the ventral cord
(series 25, image 9). No significant spinal stenosis. Foramina
remain patent.

C4-C5:  Unremarkable.

C5-C6:  Mild annular disc bulge.  No canal or foraminal stenosis.

C6-C7:  Mild disc bulge.  No canal or foraminal stenosis.

C7-T1:  Unremarkable.

Visualized upper thoracic spine demonstrates no significant finding.
IMPRESSION: MRI HEAD IMPRESSION:

Normal MRI of the brain. No acute intracranial abnormality or
findings to explain patient's symptoms identified.

MRI CERVICAL SPINE IMPRESSION:

1. No acute abnormality within the cervical spine.
2. Small central disc protrusion at C3-4, contacting and minimally
flattening the ventral spinal cord, but without significant spinal
stenosis.
3. Additional mild noncompressive disc bulging at C5-6 and C6-7
without stenosis or impingement.

## 2020-10-03 IMAGING — MR MR CERVICAL SPINE W/O CM
5 series · 35 of 48 positions shown · IV contrast (gadavist)
Comparison: Prior radiograph from [DATE].

CLINICAL DATA: Initial evaluation for 5 day history of left greater
than right facial, shoulder, arm, and hand numbness, dizziness.

EXAM:
MRI HEAD WITHOUT AND WITH CONTRAST
MRI CERVICAL SPINE WITHOUT CONTRAST
TECHNIQUE: Multiplanar, multiecho pulse sequences of the brain and surrounding
structures, and cervical spine, to include the craniocervical
junction and cervicothoracic junction, were obtained without and
with intravenous contrast.
CONTRAST:  8mL GADAVIST GADOBUTROL 1 MMOL/ML IV SOLN

[Series 22: T2 · sagittal · 3.0mm · 0.62mm/px · 6 of 15 slices shown (1 of 2)]
[im 1/15]
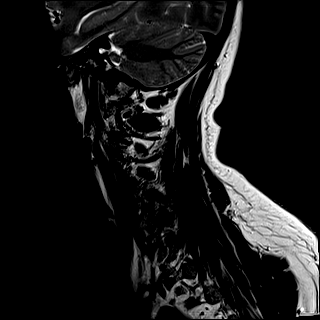
[im 3/15]
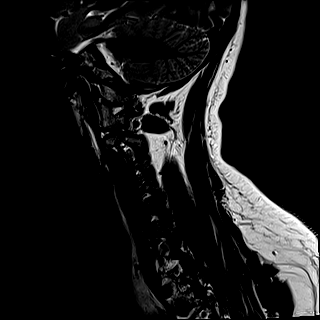
[im 6/15]
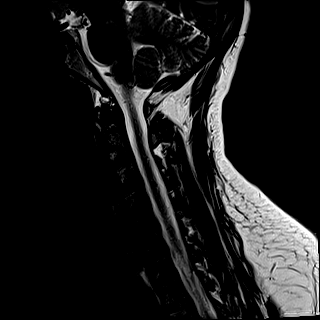
[im 9/15]
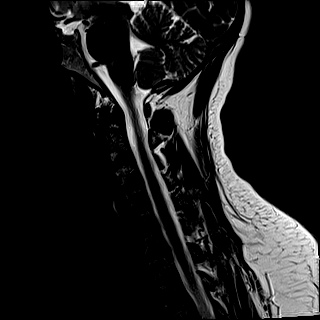
[im 12/15]
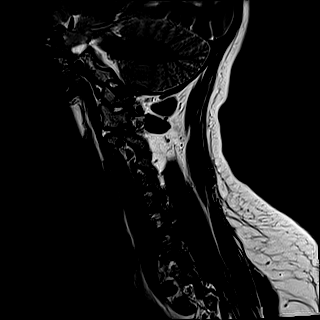
[im 15/15]
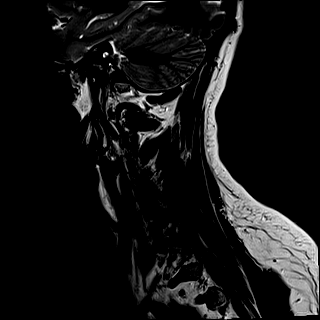

[Series 23: FLAIR · sagittal · 3.0mm · 0.78mm/px · 7 of 15 slices shown]
[im 1/15]
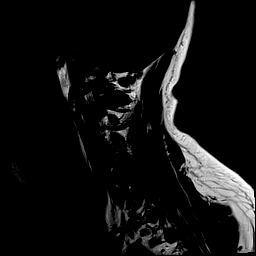
[im 3/15]
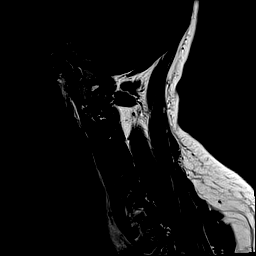
[im 5/15]
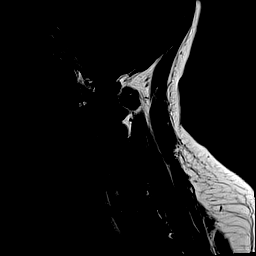
[im 8/15]
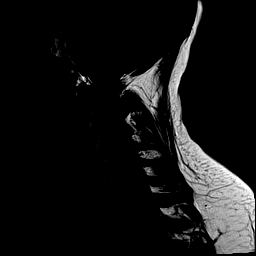
[im 10/15]
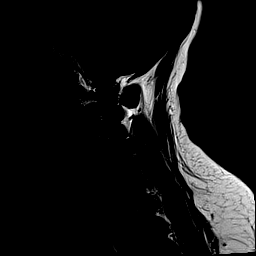
[im 12/15]
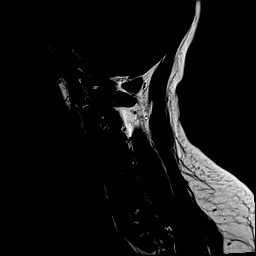
[im 15/15]
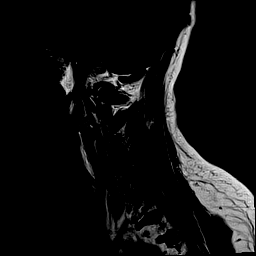

[Series 24: STIR · sagittal · 3.0mm · 0.62mm/px · 7 of 15 slices shown]
[im 1/15]
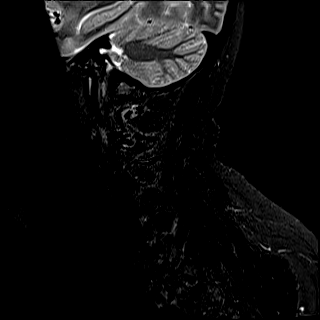
[im 3/15]
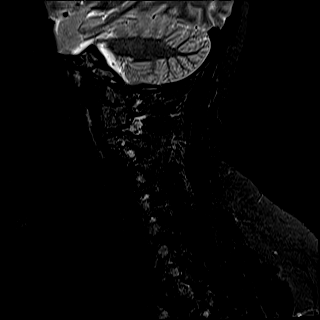
[im 5/15]
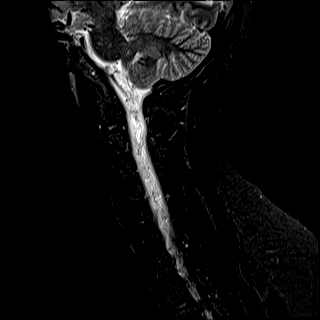
[im 8/15]
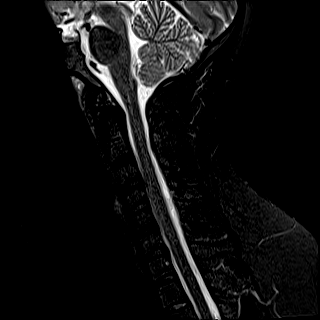
[im 10/15]
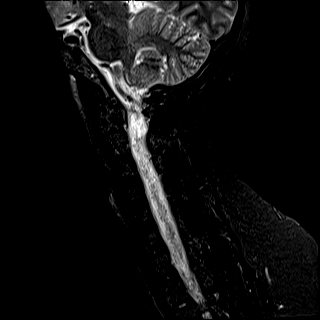
[im 12/15]
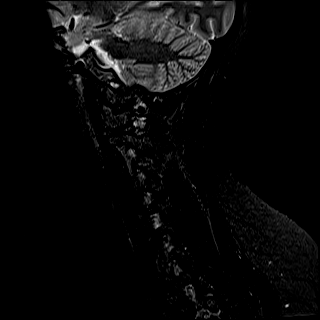
[im 15/15]
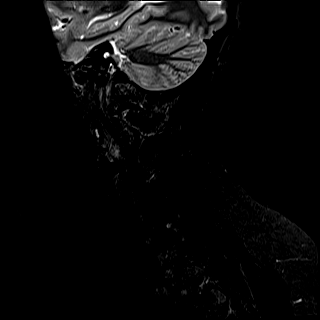

[Series 25: T2 · axial · 3.0mm · 0.70mm/px · z∈[-256,-163]mm · 8 of 29 slices shown (2 of 2)]
[im 1/29]
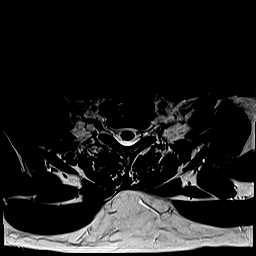
[im 5/29]
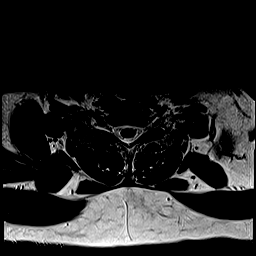
[im 9/29]
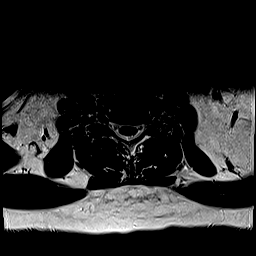
[im 13/29]
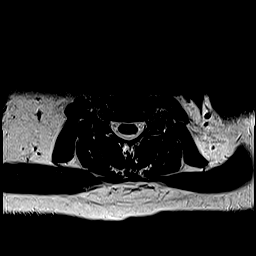
[im 16/29]
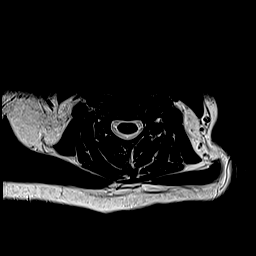
[im 20/29]
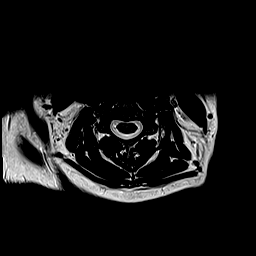
[im 24/29]
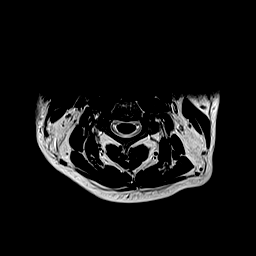
[im 29/29]
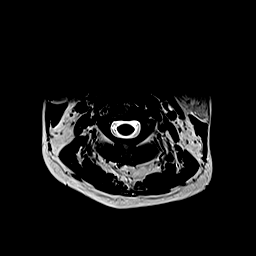

[Series 26: ax mpgr · axial · 3.0mm · 0.35mm/px · z∈[-256,-180]mm · 7 of 29 slices shown]
[im 1/29]
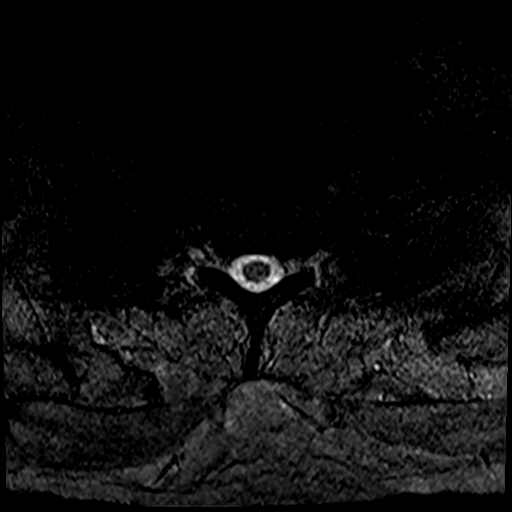
[im 5/29]
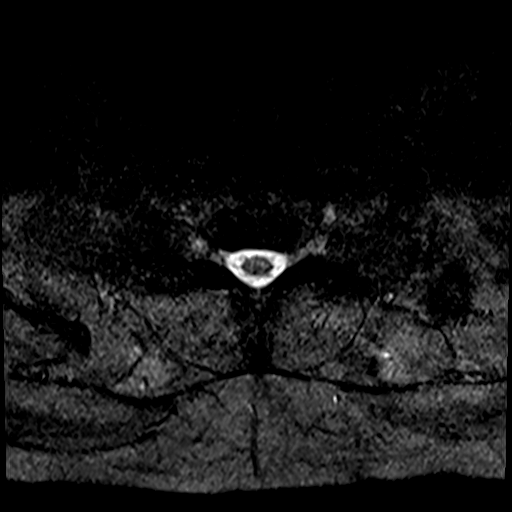
[im 9/29]
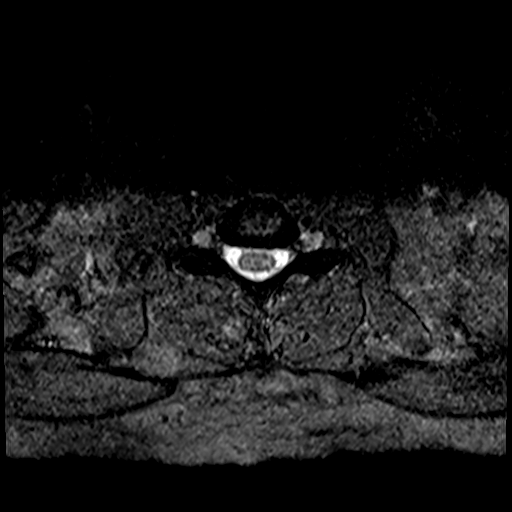
[im 13/29]
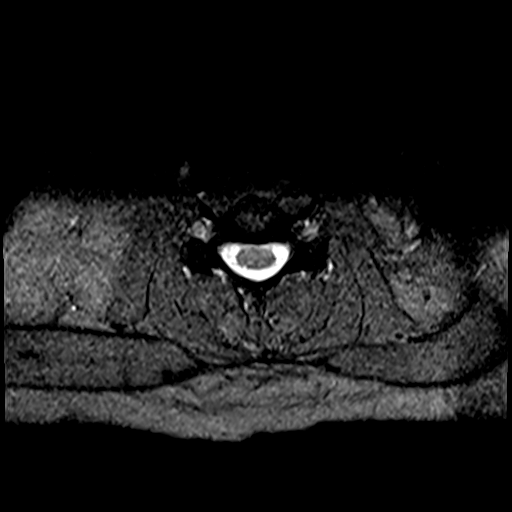
[im 16/29]
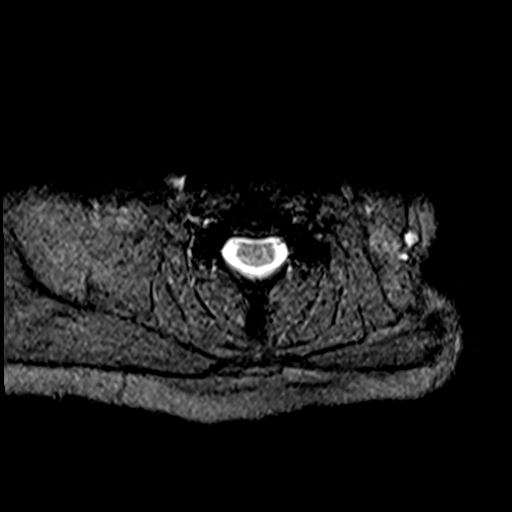
[im 20/29]
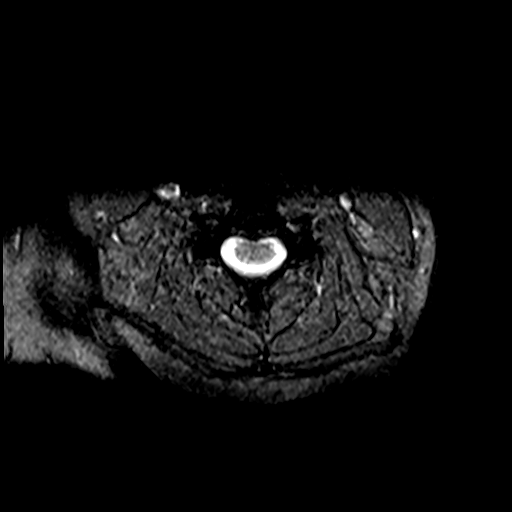
[im 24/29]
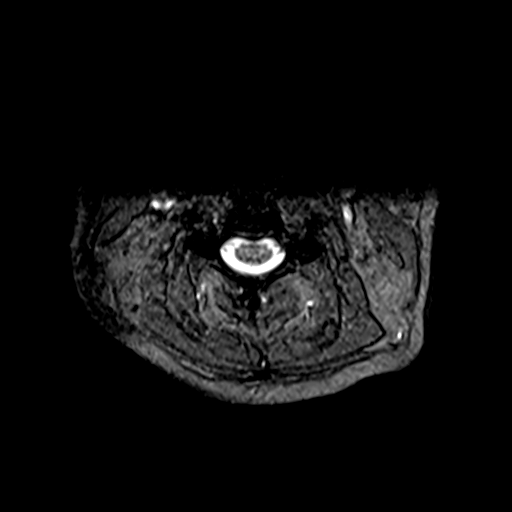

[35 of 48 positions shown; findings below may reference images not displayed]

FINDINGS: MRI HEAD FINDINGS

Brain: Cerebral volume within normal limits for patient age. No
focal parenchymal signal abnormality identified.

No abnormal foci of restricted diffusion to suggest acute or
subacute ischemia. Gray-white matter differentiation well
maintained. No encephalomalacia to suggest chronic infarction. No
foci of susceptibility artifact to suggest acute or chronic
intracranial hemorrhage.

No mass lesion, midline shift or mass effect. No hydrocephalus. No
extra-axial fluid collection. Major dural sinuses are grossly
patent.

Pituitary gland and suprasellar region are normal. Midline
structures intact and normal.

No abnormal enhancement.

Vascular: Major intracranial vascular flow voids well maintained and
normal in appearance.

Skull and upper cervical spine: Craniocervical junction normal.
Visualized upper cervical spine within normal limits. Bone marrow
signal intensity normal. No scalp soft tissue abnormality.

Sinuses/Orbits: Globes and orbital soft tissues within normal
limits.

Paranasal sinuses are clear. No mastoid effusion. Inner ear
structures normal.

Other: None.

MRI CERVICAL SPINE FINDINGS

Alignment: Straightening of the normal cervical lordosis. No
listhesis or static subluxation.

Vertebrae: Vertebral body height well maintained without acute or
chronic fracture. Bone marrow signal intensity within normal limits.
No discrete or worrisome osseous lesions. No abnormal marrow edema.

Cord: Normal signal and morphology.

Posterior Fossa, vertebral arteries, paraspinal tissues: Visualized
brain and posterior fossa within normal limits. Craniocervical
junction normal. Paraspinous and prevertebral soft tissues within
normal limits. Normal intravascular flow voids seen within the
vertebral arteries bilaterally.

Disc levels:

C2-C3: Unremarkable.

C3-C4: Small central disc protrusion mildly indents the ventral
thecal sac contacting and minimally flattening the ventral cord
(series 25, image 9). No significant spinal stenosis. Foramina
remain patent.

C4-C5:  Unremarkable.

C5-C6:  Mild annular disc bulge.  No canal or foraminal stenosis.

C6-C7:  Mild disc bulge.  No canal or foraminal stenosis.

C7-T1:  Unremarkable.

Visualized upper thoracic spine demonstrates no significant finding.
IMPRESSION: MRI HEAD IMPRESSION:

Normal MRI of the brain. No acute intracranial abnormality or
findings to explain patient's symptoms identified.

MRI CERVICAL SPINE IMPRESSION:

1. No acute abnormality within the cervical spine.
2. Small central disc protrusion at C3-4, contacting and minimally
flattening the ventral spinal cord, but without significant spinal
stenosis.
3. Additional mild noncompressive disc bulging at C5-6 and C6-7
without stenosis or impingement.

## 2020-10-03 MED ORDER — GADOBUTROL 1 MMOL/ML IV SOLN
8.0000 mL | Freq: Once | INTRAVENOUS | Status: AC | PRN
  Administered 2020-10-03: 8 mL via INTRAVENOUS

## 2020-10-04 ENCOUNTER — Other Ambulatory Visit: Payer: Self-pay | Admitting: Internal Medicine

## 2020-10-04 DIAGNOSIS — I739 Peripheral vascular disease, unspecified: Secondary | ICD-10-CM

## 2020-10-05 ENCOUNTER — Other Ambulatory Visit: Payer: Self-pay

## 2020-10-05 ENCOUNTER — Encounter: Payer: Self-pay | Admitting: Dermatology

## 2020-10-05 ENCOUNTER — Ambulatory Visit (INDEPENDENT_AMBULATORY_CARE_PROVIDER_SITE_OTHER): Payer: 59 | Admitting: Dermatology

## 2020-10-05 DIAGNOSIS — L710 Perioral dermatitis: Secondary | ICD-10-CM | POA: Diagnosis not present

## 2020-10-05 MED ORDER — DOXYCYCLINE 40 MG PO CPDR: 40.0000 mg | DELAYED_RELEASE_CAPSULE | ORAL | 4 refills | Status: DC

## 2020-10-05 NOTE — Progress Notes (Signed)
   Follow-Up Visit   Subjective  [name] is a 40 y.o. female who presents for the following: perioral dermatitis (Face, 39m f/u Aczone 7.5% gel qhs, pt didn't get Elidel, pt cleared but also had started Prednisone for RA and face cleared up, then had a flare).  The following portions of the chart were reviewed this encounter and updated as appropriate:   Tobacco  Allergies  Meds  Problems  Med Hx  Surg Hx  Fam Hx     Review of Systems:  No other skin or systemic complaints except as noted in HPI or Assessment and Plan.  Objective  Well appearing patient in no apparent distress; mood and affect are within normal limits.  A focused examination was performed including face. Relevant physical exam findings are noted in the Assessment and Plan.  Objective  face: Erythema some flakes and a few paps face   Assessment & Plan  Perioral dermatitis face  With Flare Chronic, persistent  Cont Aczone 7.5% gel qhs Start Doxycycline 40mg  1 po qd with food and drink, samples x 7 of Oracea Lot [id number] exp 09/22  Discussed adding Elidel in future,  Discussed adding Skin Medicinals Triple Rosacea Cream  Doxycycline should be taken with food to prevent nausea. Do not lay down for 30 minutes after taking. Be cautious with sun exposure and use good sun protection while on this medication. Pregnant women should not take this medication.    doxycycline (ORACEA) 40 MG capsule - face  Other Related Medications ACZONE 7.5 % GEL pimecrolimus (ELIDEL) 1 % cream  Return in about 4 months (around 02/04/2021) for perioral derm.   I, Othelia Pulling, RMA, am acting as scribe for Sarina Ser, MD .  Documentation: I have reviewed the above documentation for accuracy and completeness, and I agree with the above.  Sarina Ser, MD

## 2020-10-05 NOTE — Patient Instructions (Signed)

## 2020-10-17 ENCOUNTER — Other Ambulatory Visit: Payer: Self-pay | Admitting: Internal Medicine

## 2020-10-17 DIAGNOSIS — R601 Generalized edema: Secondary | ICD-10-CM

## 2020-10-19 ENCOUNTER — Ambulatory Visit
Admission: RE | Admit: 2020-10-19 | Discharge: 2020-10-19 | Disposition: A | Discharge status: Home / Self Care | Payer: 59 | Source: Ambulatory Visit | Attending: Internal Medicine | Admitting: Internal Medicine

## 2020-10-19 ENCOUNTER — Other Ambulatory Visit: Payer: Self-pay

## 2020-10-19 DIAGNOSIS — R601 Generalized edema: Secondary | ICD-10-CM | POA: Insufficient documentation

## 2020-10-19 IMAGING — US US ABDOMEN LIMITED RUQ/ASCITES
1 series · 9 of 9 positions shown · non-contrast
Comparison: CT abdomen pelvis dated [DATE].

CLINICAL DATA: 39-year-old female with abdominal distension and
anasarca.

EXAM:
LIMITED ABDOMEN ULTRASOUND FOR ASCITES
TECHNIQUE: Limited ultrasound survey for ascites was performed in all four
abdominal quadrants.

[Series 1: us abdomen limited · 9 of 9 slices shown]
[im 1/9]
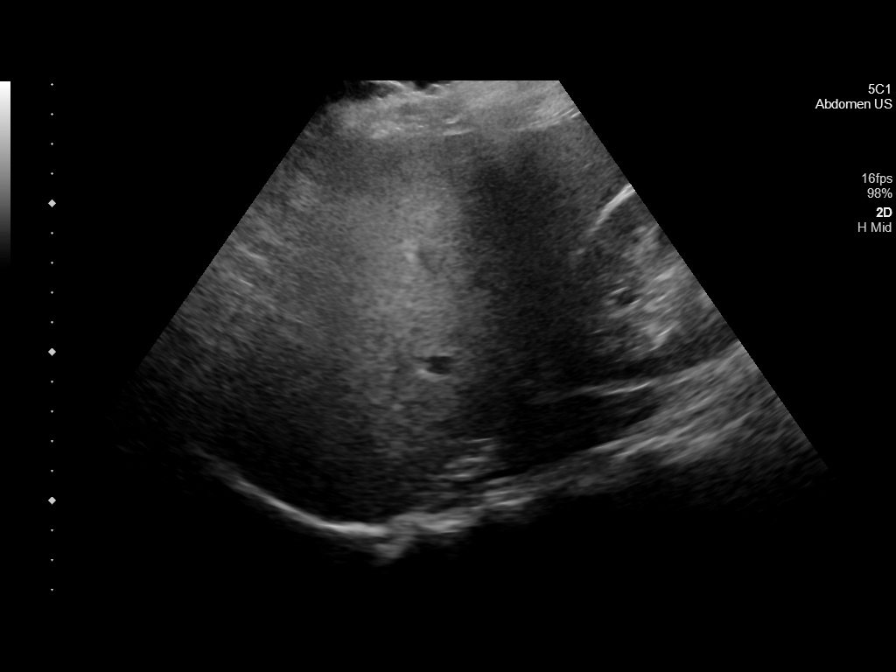
[im 2/9]
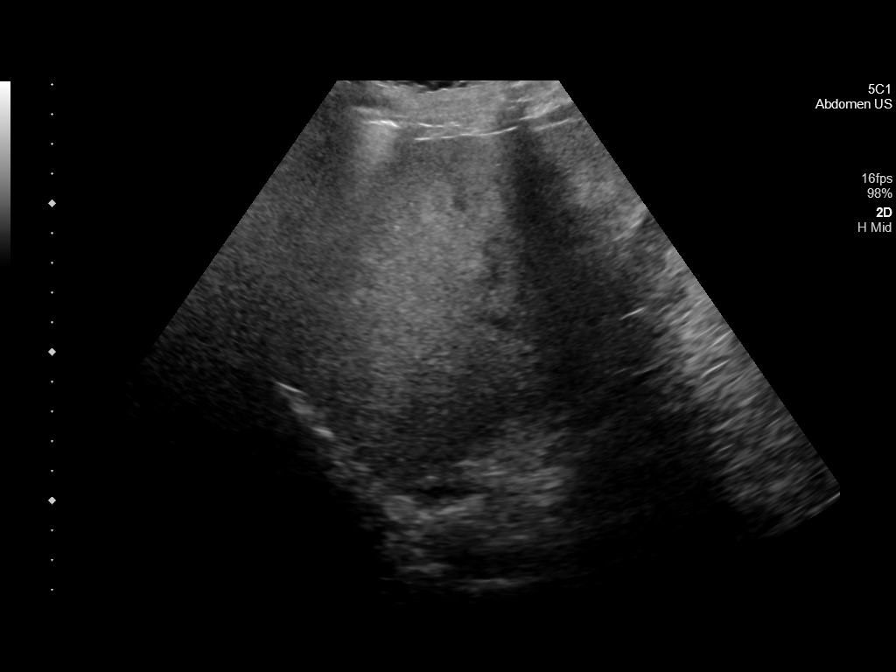
[im 3/9]
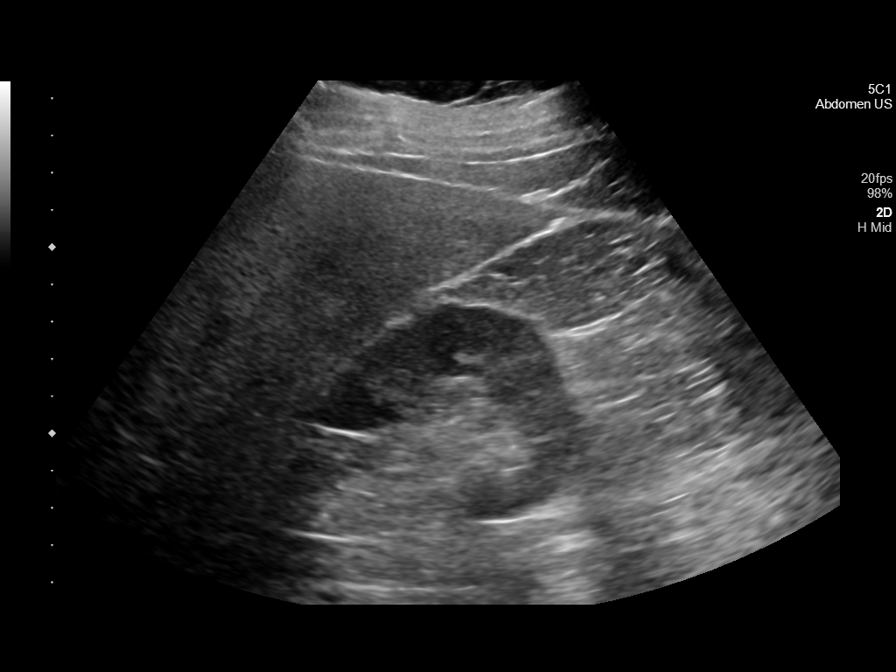
[im 4/9]
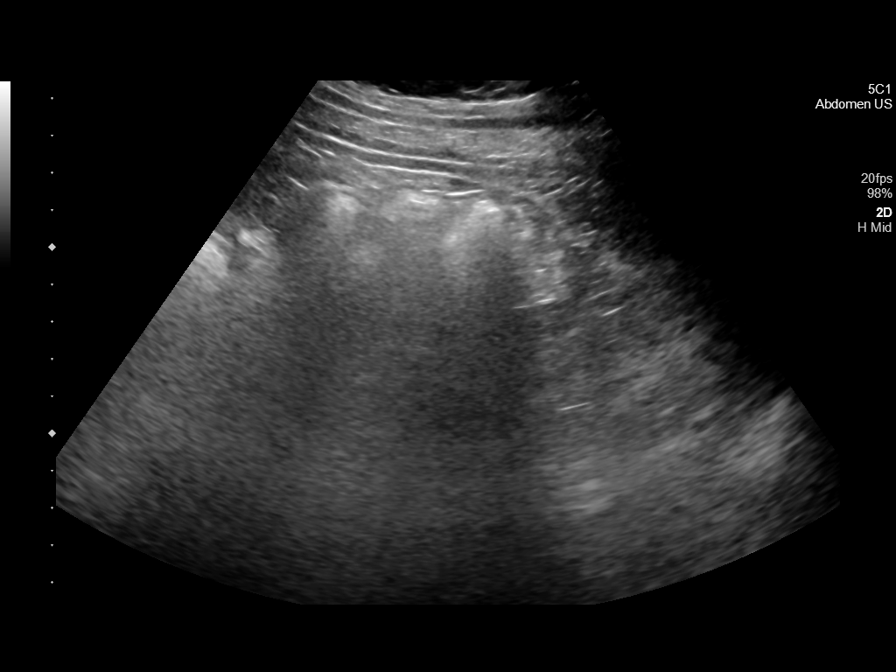
[im 5/9]
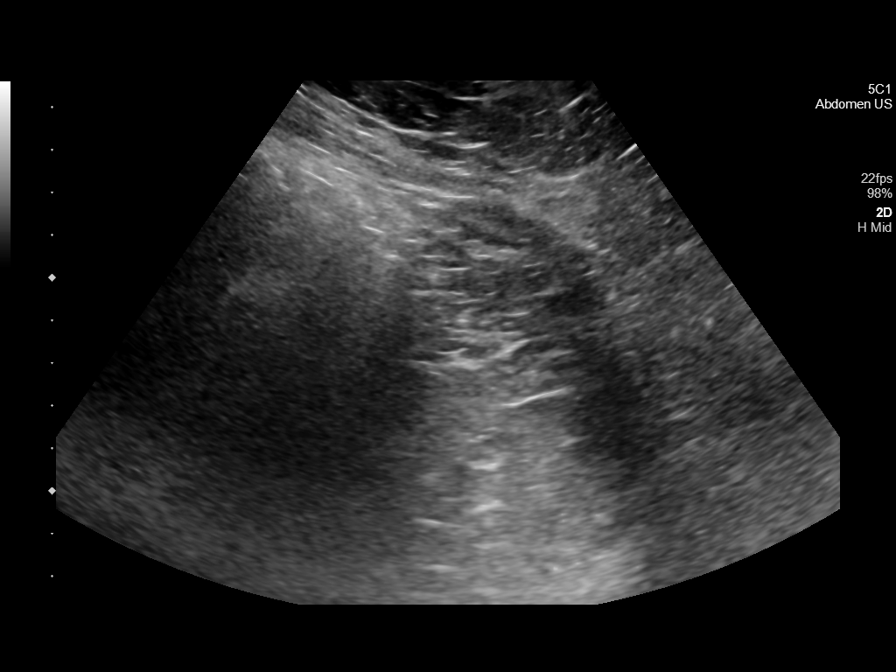
[im 6/9]
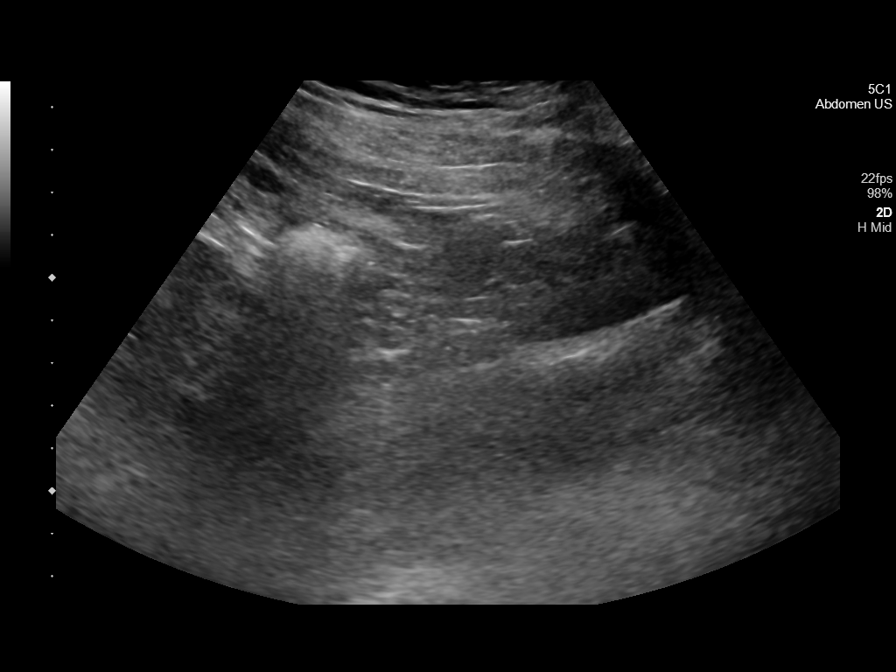
[im 7/9]
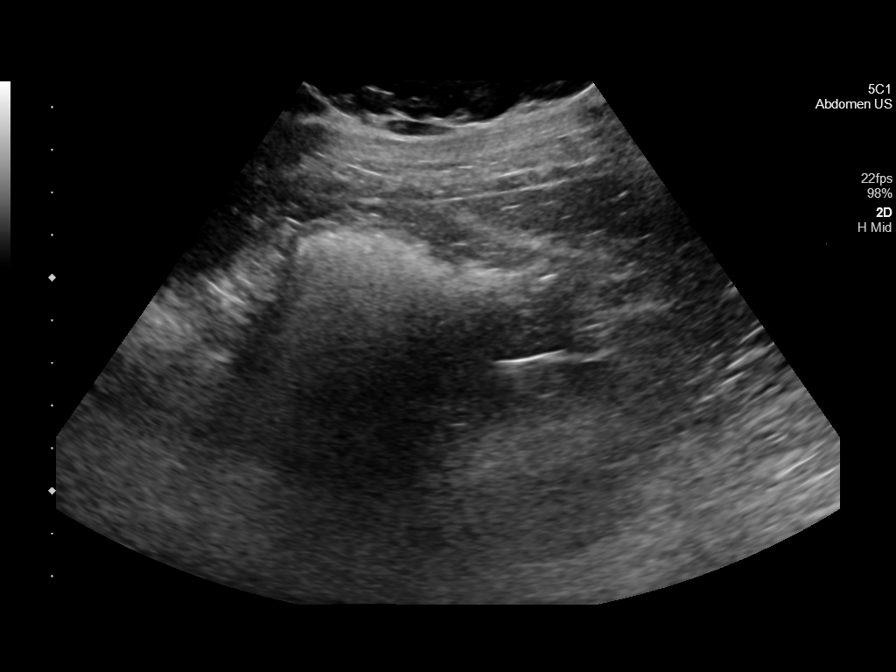
[im 8/9]
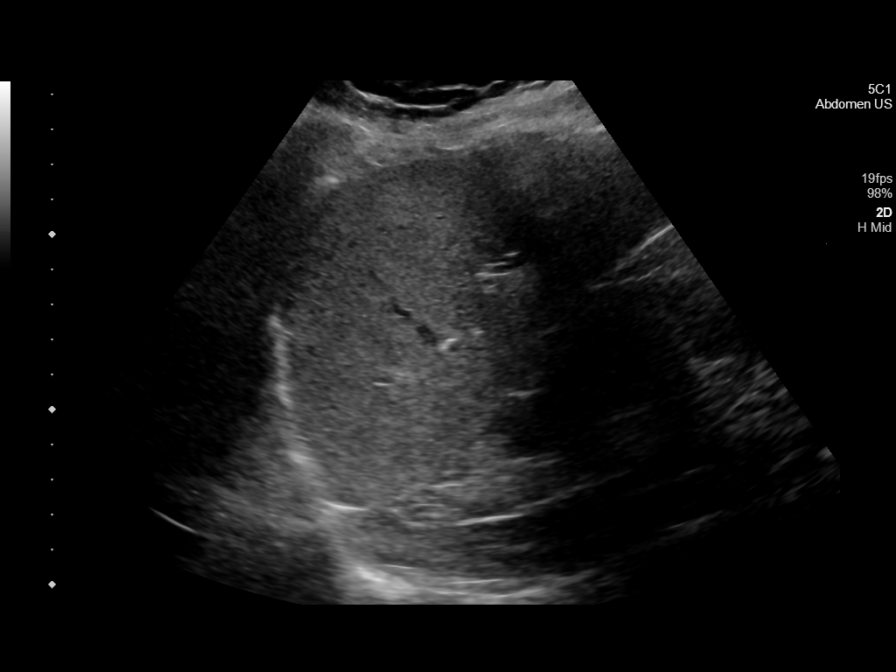
[im 9/9]
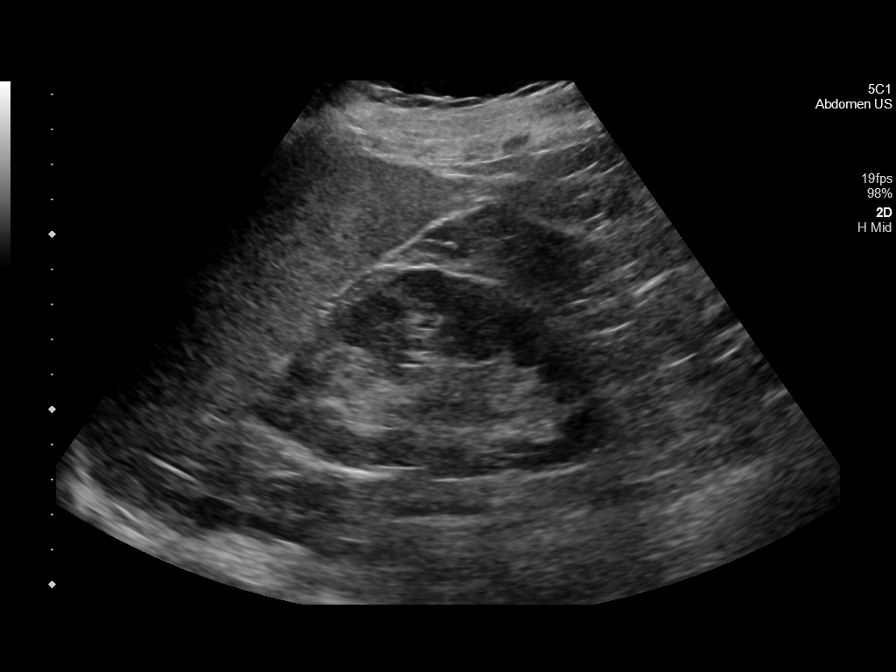

[9 of 9 positions shown; findings below may reference images not displayed]

FINDINGS: Limited sonographic images of the 4 quadrants of the abdomen
performed. No free fluid identified.
IMPRESSION: No ascites.

## 2020-10-26 ENCOUNTER — Ambulatory Visit: Payer: 59

## 2020-11-10 ENCOUNTER — Ambulatory Visit: Payer: 59

## 2021-02-01 ENCOUNTER — Ambulatory Visit: Payer: 59 | Admitting: Dermatology

## 2021-03-22 ENCOUNTER — Other Ambulatory Visit: Payer: Self-pay | Admitting: Rheumatology

## 2021-03-22 DIAGNOSIS — M06331 Rheumatoid nodule, right wrist: Secondary | ICD-10-CM

## 2021-03-24 ENCOUNTER — Ambulatory Visit
Admission: RE | Admit: 2021-03-24 | Discharge: 2021-03-24 | Disposition: A | Discharge status: Home / Self Care | Payer: 59 | Source: Ambulatory Visit | Attending: Rheumatology | Admitting: Rheumatology

## 2021-03-24 ENCOUNTER — Other Ambulatory Visit: Payer: Self-pay

## 2021-03-24 DIAGNOSIS — M06331 Rheumatoid nodule, right wrist: Secondary | ICD-10-CM

## 2021-03-24 IMAGING — MR MR [PERSON_NAME]*[PERSON_NAME]* W/O CM
4 of 6 series · 22 of 40 positions shown · non-contrast
Comparison: None.

CLINICAL DATA: Rheumatoid arthritis, lateral-sided wrist pain

EXAM:
MR OF THE RIGHT WRIST WITHOUT CONTRAST
MR OF THE RIGHT HAND WITHOUT CONTRAST
TECHNIQUE: Multiplanar, multisequence MR imaging of the right wrist was
performed. No intravenous contrast was administered.
Multiplanar, multisequence MR imaging of the right hand was

[Series 17: T1 · axial · 4.0mm · 0.55mm/px · z∈[-75,+49]mm · 3 of 39 slices shown]
[im 5/39]
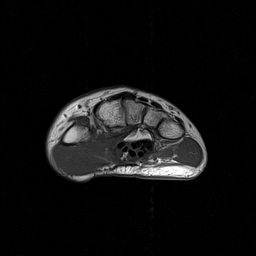
[im 22/39]
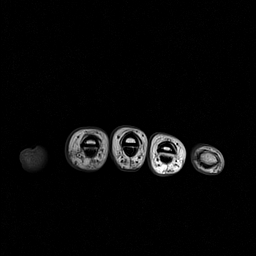
[im 34/39]
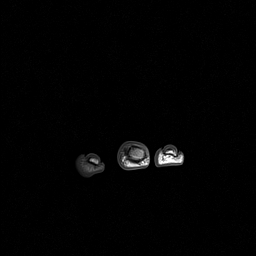

[Series 19: T2 fat-sat · axial · 4.0mm · 0.51mm/px · z∈[-91,+51]mm · 7 of 39 slices shown (1 of 2)]
[im 1/39]
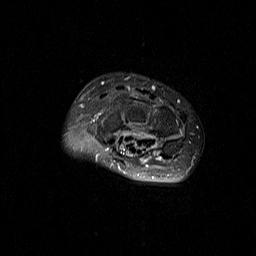
[im 5/39]
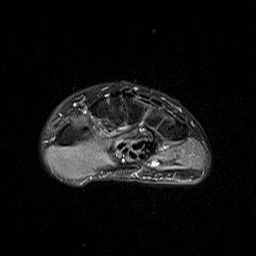
[im 10/39]
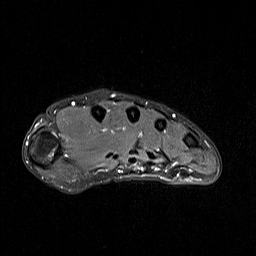
[im 15/39]
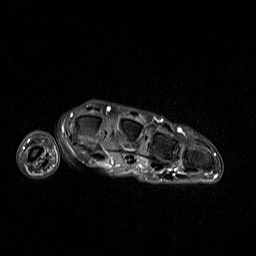
[im 20/39]
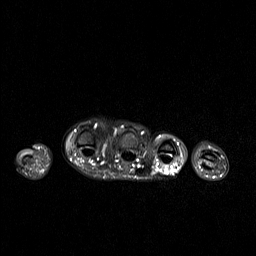
[im 24/39]
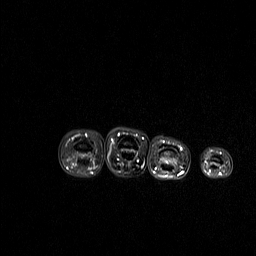
[im 34/39]
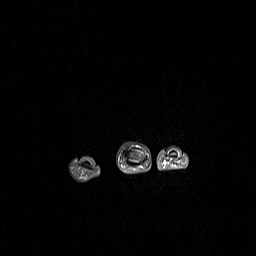

[Series 22: PD fat-sat · sagittal · 3.0mm · 0.23mm/px · 9 of 38 slices shown]
[im 1/38]
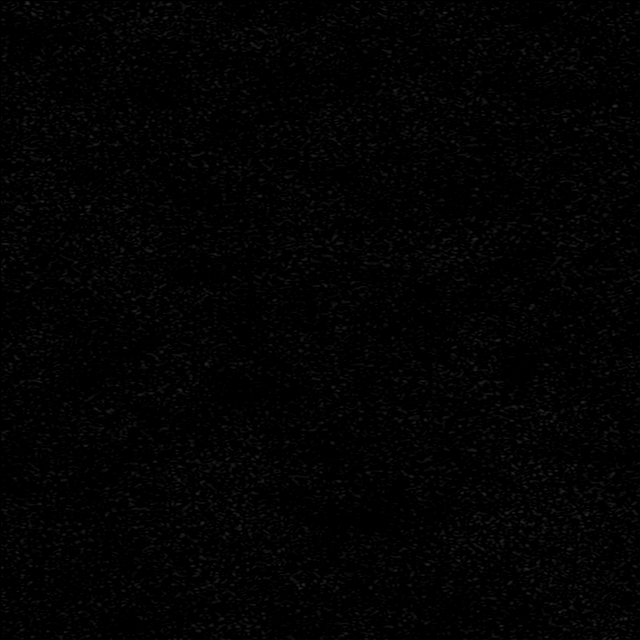
[im 5/38]
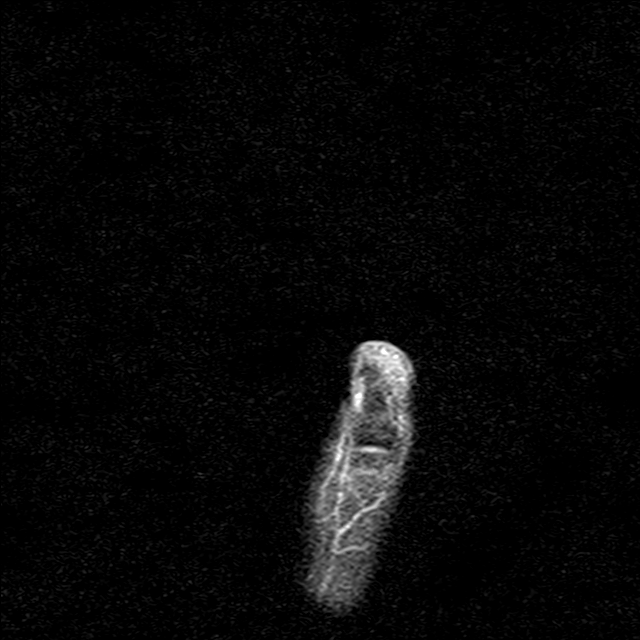
[im 10/38]
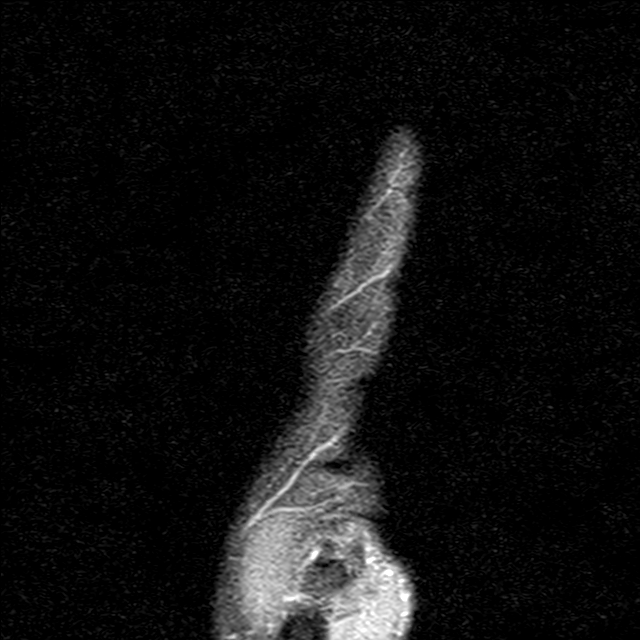
[im 14/38]
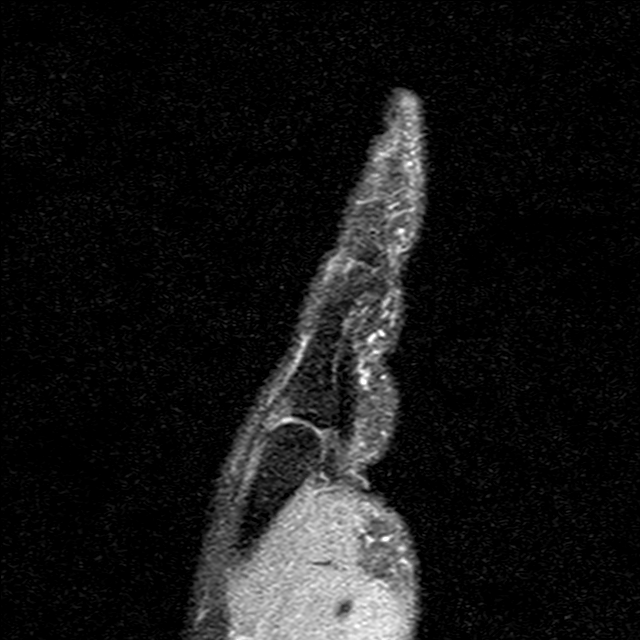
[im 19/38]
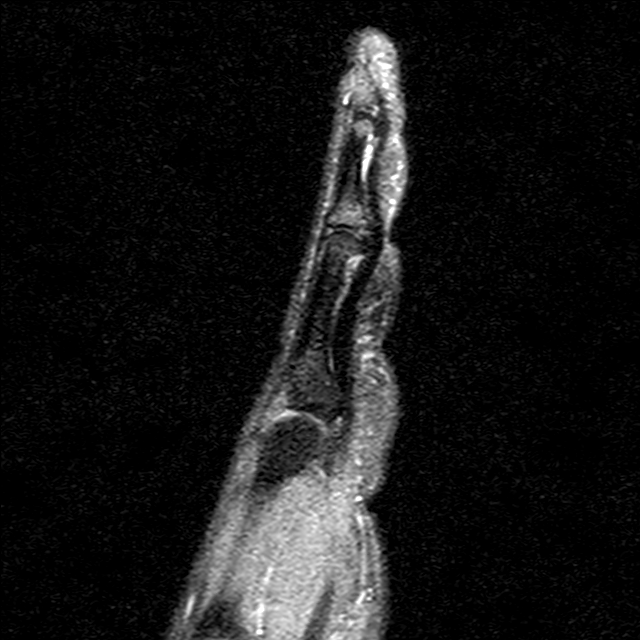
[im 24/38]
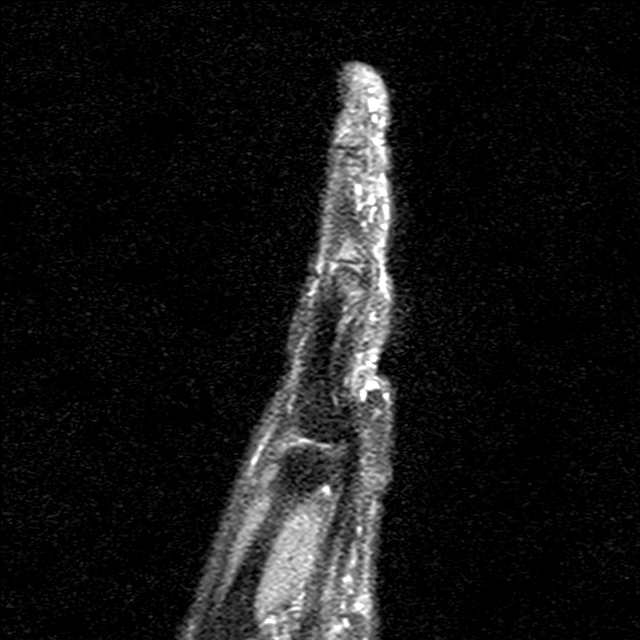
[im 28/38]
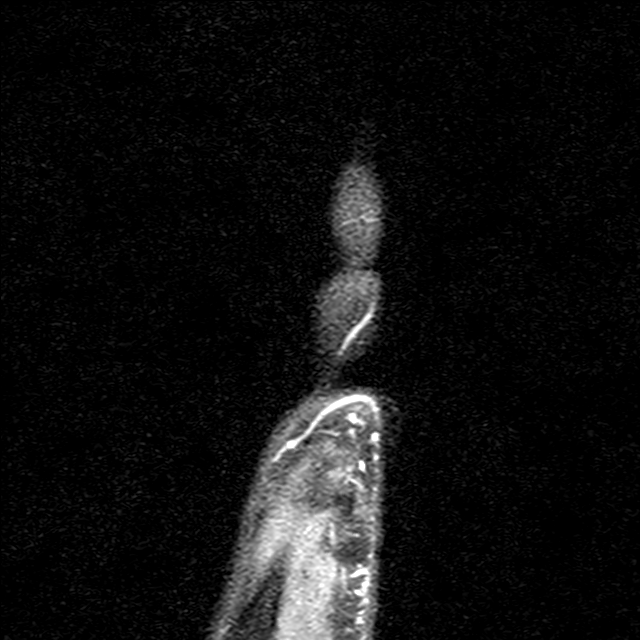
[im 33/38]
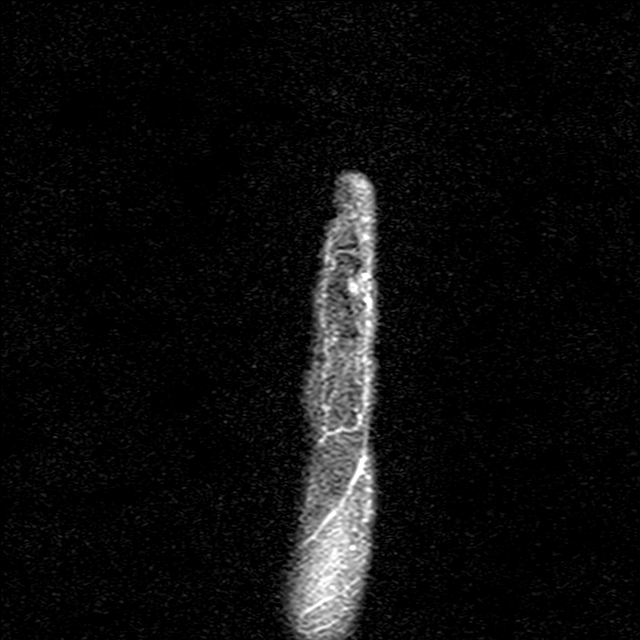
[im 38/38]
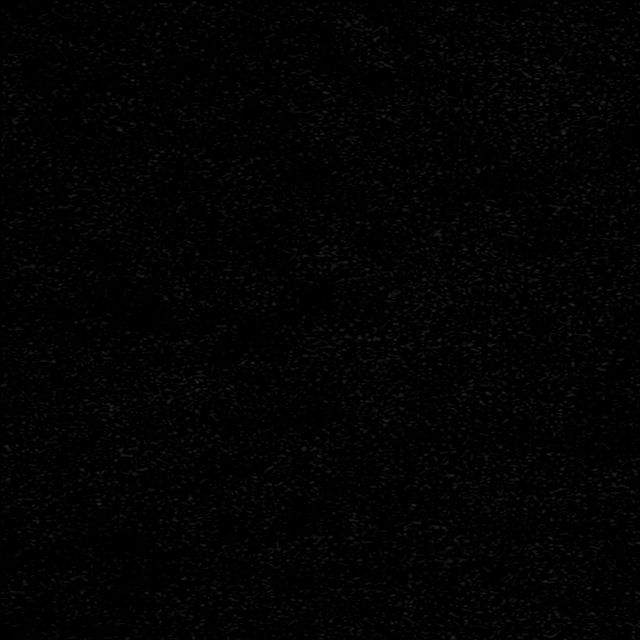

[Series 24: T2 fat-sat · coronal · 3.0mm · 0.29mm/px · 3 of 17 slices shown (2 of 2)]
[im 1/17]
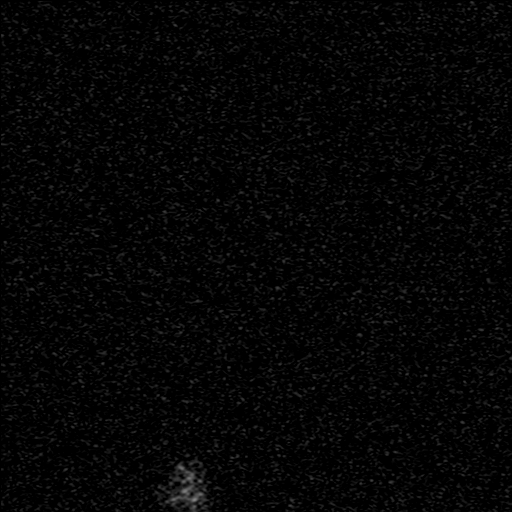
[im 11/17]
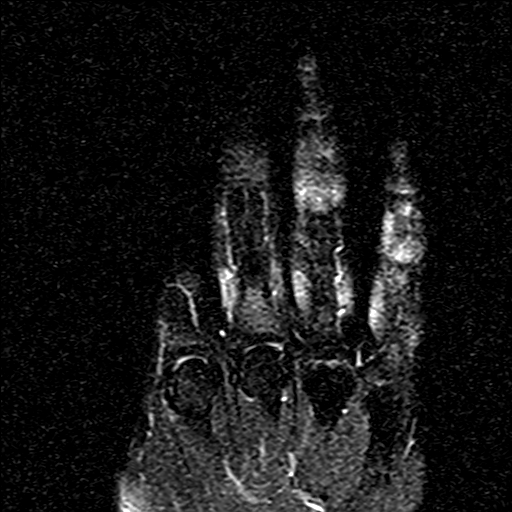
[im 17/17]
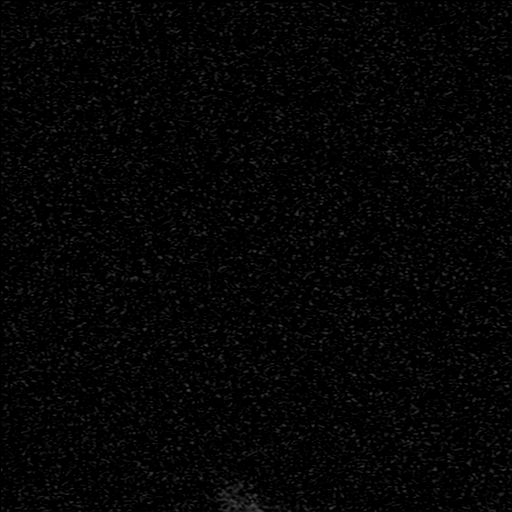

[22 of 40 positions shown; findings below may reference images not displayed]

FINDINGS: Ligaments: Extrinsic ligaments are intact. Scapholunate and
lunotriquetral ligaments are intact.

Triangular fibrocartilage: Intact

Tendons: Moderate tendinosis of the extensor carpi ulnaris tendon.
Remainder the extensor compartment tendons are intact. Flexor
compartment tendons are intact.

Carpal tunnel/median nerve: Flexor retinaculum is intact. Normal
carpal tunnel without a mass. Median nerve demonstrates normal
signal and caliber.

Guyon's canal: Normal Guyon's canal. Normal ulnar nerve.

Joint/cartilage: Mild partial-thickness cartilage loss of the
scaphotrapeziotrapezoid joint and first CMC joint. Mild
partial-thickness cartilage loss of the first MCP joint and first IP
joint. No joint effusion.

Bones/carpal alignment: No fracture, avascular necrosis, or osseous
lesion. Normal alignment.

Other: Muscles are normal. No fluid collection, hematoma, or soft
tissue mass.
IMPRESSION: 1. Moderate tendinosis of the extensor carpi ulnaris tendon.
2. Mild partial-thickness cartilage loss of the
scaphotrapeziotrapezoid joint and first CMC joint.
3. Mild partial-thickness cartilage loss of the
scaphotrapeziotrapezoid joint and first CMC joint.
4. Mild partial-thickness cartilage loss of the first MCP joint and
first IP joint.

## 2021-03-24 IMAGING — MR MR WRIST*R* W/O CM
6 of 7 series · 33 of 40 positions shown · non-contrast
Comparison: None.

CLINICAL DATA: Rheumatoid arthritis, lateral-sided wrist pain

EXAM:
MR OF THE RIGHT WRIST WITHOUT CONTRAST
MR OF THE RIGHT HAND WITHOUT CONTRAST
TECHNIQUE: Multiplanar, multisequence MR imaging of the right wrist was
performed. No intravenous contrast was administered.
Multiplanar, multisequence MR imaging of the right hand was

[Series 3: T2 fat-sat · axial · 3.0mm · 0.39mm/px · z∈[-73,-0]mm · 6 of 23 slices shown (1 of 3)]
[im 1/23]
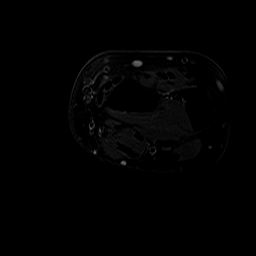
[im 5/23]
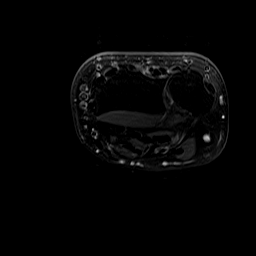
[im 9/23]
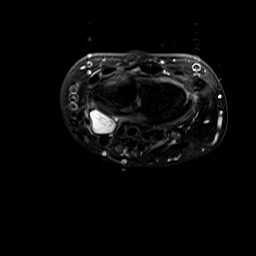
[im 14/23]
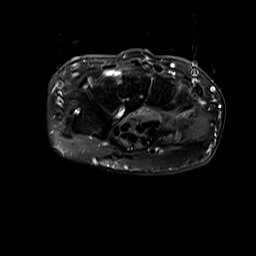
[im 18/23]
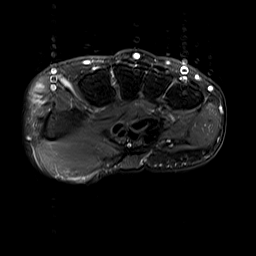
[im 23/23]
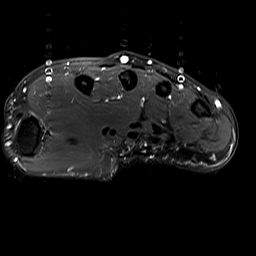

[Series 4: T1 · axial · 3.0mm · 0.31mm/px · z∈[-73,-30]mm · 4 of 23 slices shown]
[im 1/23]
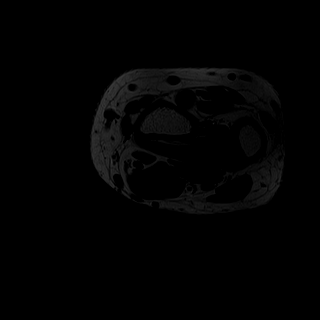
[im 5/23]
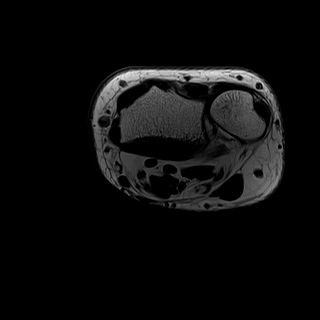
[im 9/23]
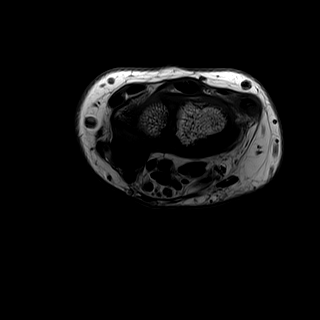
[im 14/23]
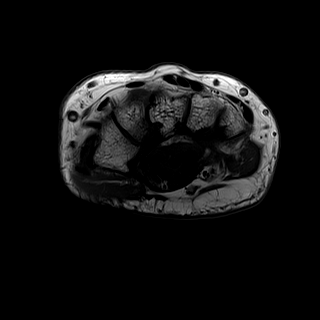

[Series 6: T2 fat-sat · coronal · 3.0mm · 0.39mm/px · 4 of 15 slices shown (2 of 3)]
[im 1/15]
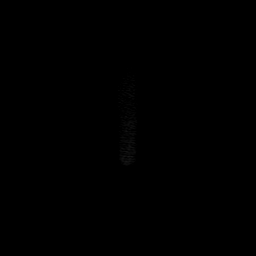
[im 5/15]
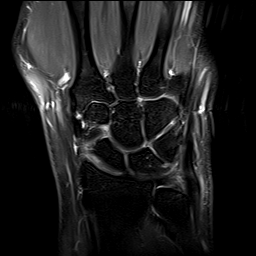
[im 10/15]
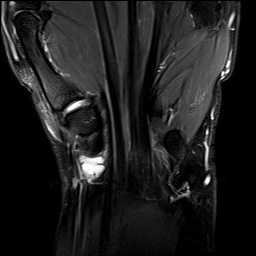
[im 15/15]
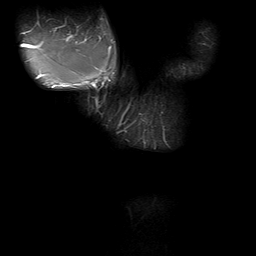

[Series 7: PD fat-sat · coronal · 3.0mm · 0.20mm/px · 5 of 16 slices shown (1 of 2)]
[im 1/16]
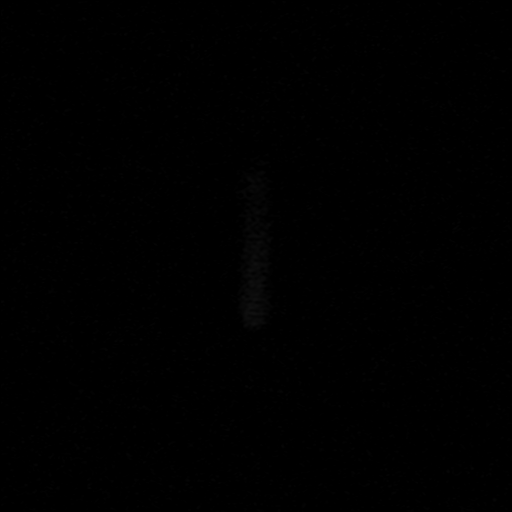
[im 4/16]
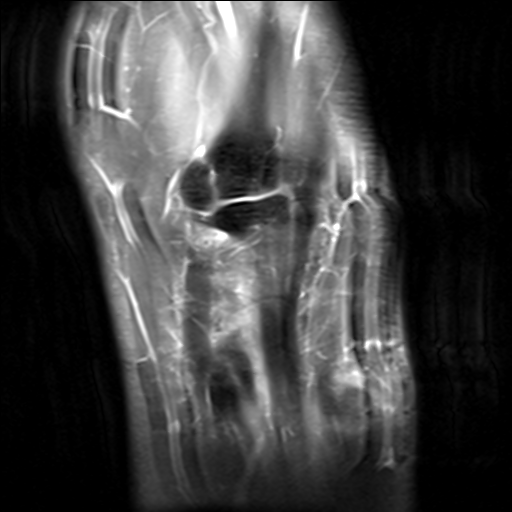
[im 8/16]
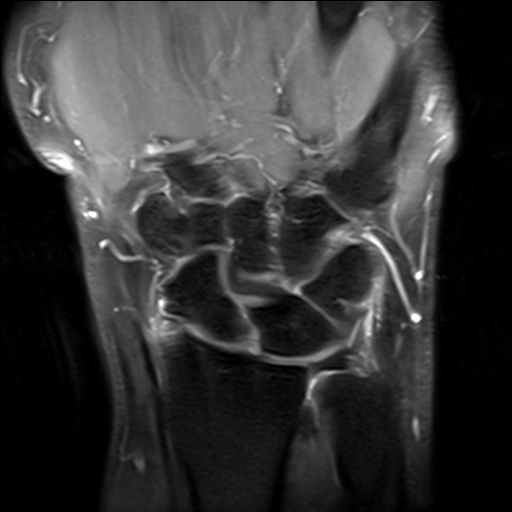
[im 12/16]
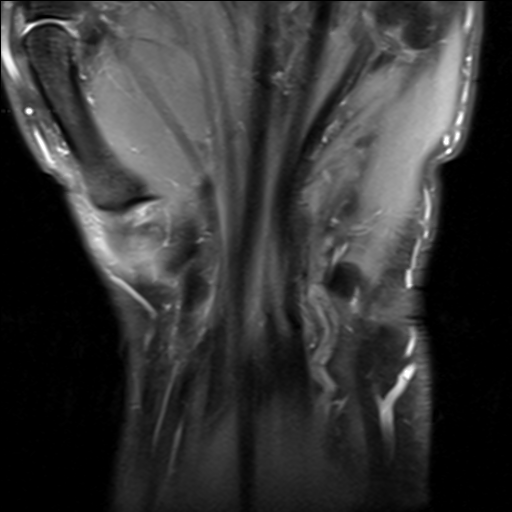
[im 16/16]
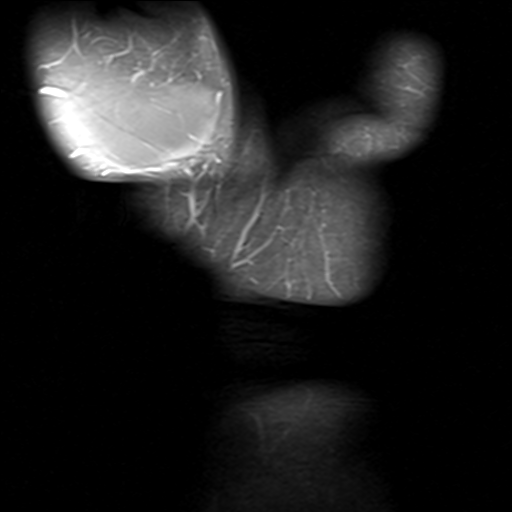

[Series 8: PD fat-sat · sagittal · 3.0mm · 0.18mm/px · 7 of 23 slices shown (2 of 2)]
[im 1/23]
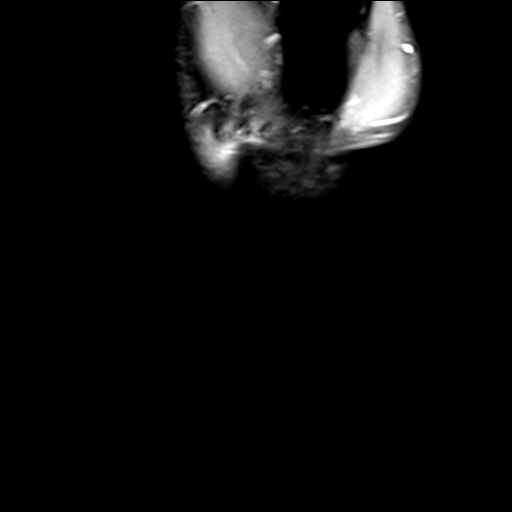
[im 4/23]
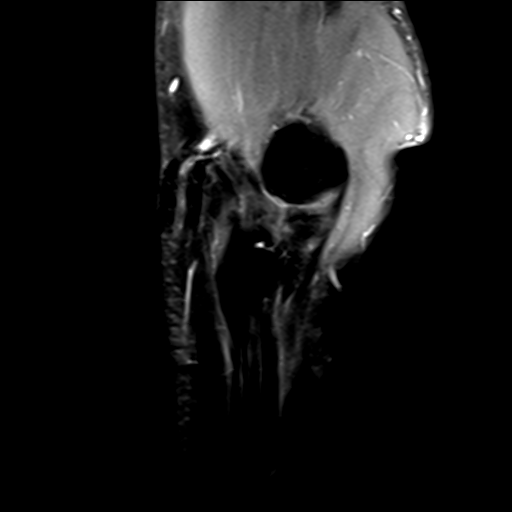
[im 8/23]
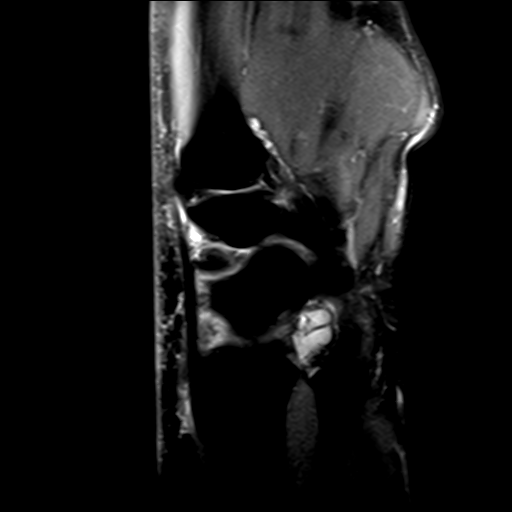
[im 12/23]
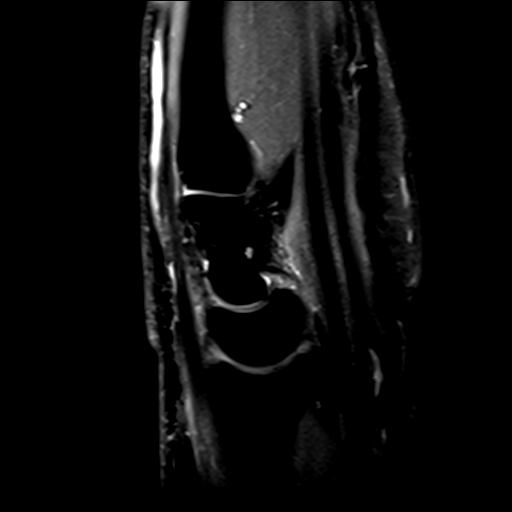
[im 15/23]
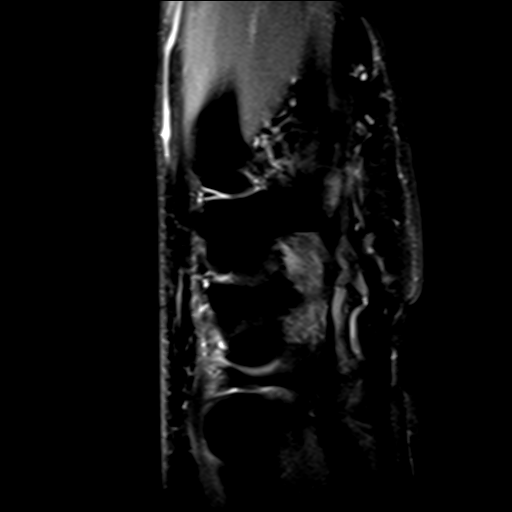
[im 19/23]
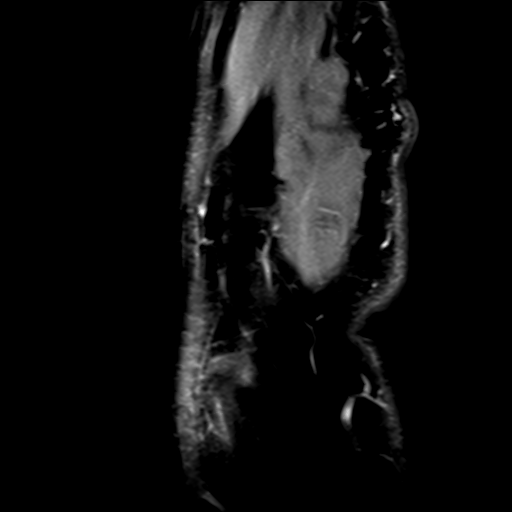
[im 23/23]
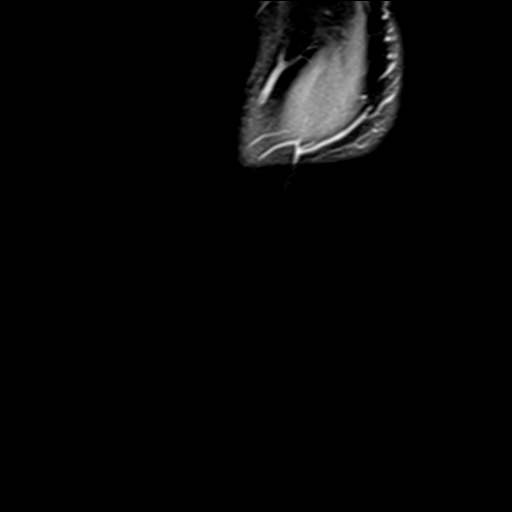

[Series 9: T2 fat-sat · axial · 3.0mm · 0.39mm/px · z∈[-73,-0]mm · 7 of 23 slices shown (3 of 3)]
[im 1/23]
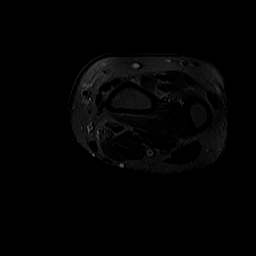
[im 4/23]
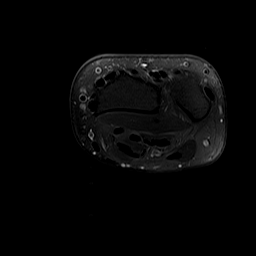
[im 8/23]
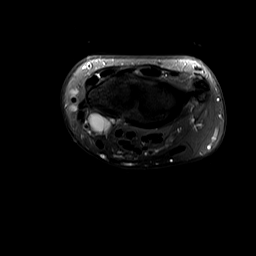
[im 12/23]
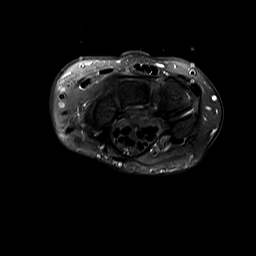
[im 15/23]
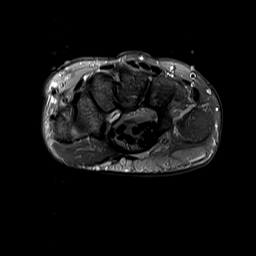
[im 19/23]
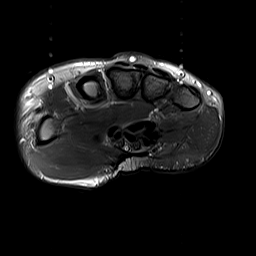
[im 23/23]
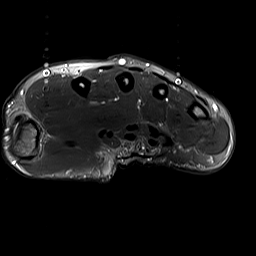

[33 of 40 positions shown; findings below may reference images not displayed]

FINDINGS: Ligaments: Extrinsic ligaments are intact. Scapholunate and
lunotriquetral ligaments are intact.

Triangular fibrocartilage: Intact

Tendons: Moderate tendinosis of the extensor carpi ulnaris tendon.
Remainder the extensor compartment tendons are intact. Flexor
compartment tendons are intact.

Carpal tunnel/median nerve: Flexor retinaculum is intact. Normal
carpal tunnel without a mass. Median nerve demonstrates normal
signal and caliber.

Guyon's canal: Normal Guyon's canal. Normal ulnar nerve.

Joint/cartilage: Mild partial-thickness cartilage loss of the
scaphotrapeziotrapezoid joint and first CMC joint. Mild
partial-thickness cartilage loss of the first MCP joint and first IP
joint. No joint effusion.

Bones/carpal alignment: No fracture, avascular necrosis, or osseous
lesion. Normal alignment.

Other: Muscles are normal. No fluid collection, hematoma, or soft
tissue mass.
IMPRESSION: 1. Moderate tendinosis of the extensor carpi ulnaris tendon.
2. Mild partial-thickness cartilage loss of the
scaphotrapeziotrapezoid joint and first CMC joint.
3. Mild partial-thickness cartilage loss of the
scaphotrapeziotrapezoid joint and first CMC joint.
4. Mild partial-thickness cartilage loss of the first MCP joint and
first IP joint.

## 2021-04-24 ENCOUNTER — Other Ambulatory Visit (HOSPITAL_COMMUNITY): Payer: Self-pay | Admitting: Internal Medicine

## 2021-05-02 ENCOUNTER — Other Ambulatory Visit: Payer: Self-pay

## 2021-05-02 ENCOUNTER — Ambulatory Visit: Payer: 59 | Attending: Internal Medicine

## 2021-10-30 ENCOUNTER — Ambulatory Visit (INDEPENDENT_AMBULATORY_CARE_PROVIDER_SITE_OTHER): Payer: 59 | Admitting: Dermatology

## 2021-10-30 ENCOUNTER — Encounter: Payer: Self-pay | Admitting: Dermatology

## 2021-10-30 DIAGNOSIS — L710 Perioral dermatitis: Secondary | ICD-10-CM

## 2021-10-30 DIAGNOSIS — L578 Other skin changes due to chronic exposure to nonionizing radiation: Secondary | ICD-10-CM

## 2021-10-30 DIAGNOSIS — L738 Other specified follicular disorders: Secondary | ICD-10-CM

## 2021-10-30 DIAGNOSIS — L918 Other hypertrophic disorders of the skin: Secondary | ICD-10-CM

## 2021-10-30 MED ORDER — DOXYCYCLINE 40 MG PO CPDR: 40.0000 mg | DELAYED_RELEASE_CAPSULE | ORAL | 4 refills | Status: DC

## 2021-10-30 NOTE — Progress Notes (Signed)
? ?  Follow-Up Visit ?  ?Subjective  ?[name] is a 41 y.o. female who presents for the following: Rash (Recheck perioral dermatitis on face. Using Aczone 7.5% gel and taking Doxycycline 40 mg daily as directed. C/O flesh colored bumps on face. Improved today) and Skin Tag (Would like skin tags under arms removed). ?The patient has spots, moles and lesions to be evaluated, some may be new or changing and the patient has concerns that these could be cancer. ? ?The following portions of the chart were reviewed this encounter and updated as appropriate:  Tobacco  Allergies  Meds  Problems  Med Hx  Surg Hx  Fam Hx   ?  ?Review of Systems: No other skin or systemic complaints except as noted in HPI or Assessment and Plan. ? ?Objective  ?Well appearing patient in no apparent distress; mood and affect are within normal limits. ? ?A focused examination was performed including face, neck, axillae. Relevant physical exam findings are noted in the Assessment and Plan. ? ?face, perioral ?Mild erythema ? ? ?Assessment & Plan  ? ?Sebaceous Hyperplasia. Face. ?- Small yellow papules with a central dell ?- Benign ?- Observe  ? ?Acrochordons (Skin Tags) - Removal desired by patient ?- Fleshy, skin-colored pedunculated papules ?- Benign appearing.  ?- Patient desires removal. Reviewed that this is not covered by insurance and they will be charged a cosmetic fee for removal. Patient signed non-covered consent.  ?- Prior to procedure, discussed risks of blister formation, small wound, skin dyspigmentation, or rare scar following cryotherapy.  ?PROCEDURE ?- Cryotherapy was performed to affected areas. The procedure was tolerated well. Wound care was reviewed with the patient. They were advised to call with any concerns. Total number of treated acrochordons 12  6 on left, 6 on right ? ?Perioral dermatitis ?face, perioral ?Chronic and persistent condition with duration or expected duration over one year. Condition is  symptomatic / bothersome to patient. Not to goal. ?Continue Doxycycline '40mg'$  once daily  ?Continue Aczone 7.5 mg once daily to face. ?Start Elidel daily ? ?Related Medications ?ACZONE 7.5 % GEL ?Apply 1 application topically at bedtime. qhs to face ?pimecrolimus (ELIDEL) 1 % cream ?Apply topically in the morning. qam to face ?doxycycline (ORACEA) 40 MG capsule ?Take 1 capsule (40 mg total) by mouth every morning. Take with food and drink ? ?Actinic Damage ?- chronic, secondary to cumulative UV radiation exposure/sun exposure over time ?- diffuse scaly erythematous macules with underlying dyspigmentation ?- Recommend daily broad spectrum sunscreen SPF 30+ to sun-exposed areas, reapply every 2 hours as needed.  ?- Recommend staying in the shade or wearing long sleeves, sun glasses (UVA+UVB protection) and wide brim hats (4-inch brim around the entire circumference of the hat). ?- Call for new or changing lesions. ? ?Return in about 6 months (around 05/02/2022) for perioral dermatitis. ? ?I, Emelia Salisbury, CMA, am acting as scribe for Sarina Ser, MD. ?Documentation: I have reviewed the above documentation for accuracy and completeness, and I agree with the above. ? ?Sarina Ser, MD ? ? ?

## 2021-10-30 NOTE — Patient Instructions (Addendum)
Perioral Dermatitis ?Continue Doxycycline '40mg'$  once daily  ?Continue Aczone 7.5 mg once daily to face. ? ? ?Doxycycline should be taken with food to prevent nausea. Do not lay down for 30 minutes after taking. Be cautious with sun exposure and use good sun protection while on this medication. Pregnant women should not take this medication.   ? ? ?Cryotherapy Aftercare ? ?Wash gently with soap and water everyday.   ?Apply Vaseline and Band-Aid daily until healed.  ? ?Prior to procedure, discussed risks of blister formation, small wound, skin dyspigmentation, or rare scar following cryotherapy. Recommend Vaseline ointment to treated areas while healing.  ? ? ?If You Need Anything After Your Visit ? ?If you have any questions or concerns for your doctor, please call our main line at [phone] and press option 4 to reach your doctor's medical assistant. If no one answers, please leave a voicemail as directed and we will return your call as soon as possible. Messages left after 4 pm will be answered the following business day.  ? ?You may also send [place] a message via MyChart. We typically respond to MyChart messages within 1-2 business days. ? ?For prescription refills, please ask your pharmacy to contact our office. Our fax number is [phone]. ? ?If you have an urgent issue when the clinic is closed that cannot wait until the next business day, you can page your doctor at the number below.   ? ?Please note that while we do our best to be available for urgent issues outside of office hours, we are not available 24/7.  ? ?If you have an urgent issue and are unable to reach [place], you may choose to seek medical care at your doctor's office, retail clinic, urgent care center, or emergency room. ? ?If you have a medical emergency, please immediately call 911 or go to the emergency department. ? ?Pager Numbers ? ?- Dr. Nehemiah Massed: [phone] ? ?- Dr. Laurence Ferrari: [phone] ? ?- Dr. Nicole Kindred: [phone] ? ?In the event of  inclement weather, please call our main line at [phone] for an update on the status of any delays or closures. ? ?Dermatology Medication Tips: ?Please keep the boxes that topical medications come in in order to help keep track of the instructions about where and how to use these. Pharmacies typically print the medication instructions only on the boxes and not directly on the medication tubes.  ? ?If your medication is too expensive, please contact our office at [phone] option 4 or send [place] a message through [place].  ? ?We are unable to tell what your co-pay for medications will be in advance as this is different depending on your insurance coverage. However, we may be able to find a substitute medication at lower cost or fill out paperwork to get insurance to cover a needed medication.  ? ?If a prior authorization is required to get your medication covered by your insurance company, please allow [place] 1-2 business days to complete this process. ? ?Drug prices often vary depending on where the prescription is filled and some pharmacies may offer cheaper prices. ? ?The website www.goodrx.com contains coupons for medications through different pharmacies. The prices here do not account for what the cost may be with help from insurance (it may be cheaper with your insurance), but the website can give you the price if you did not use any insurance.  ?- You can print the associated coupon and take it with your prescription to the pharmacy.  ?- You may also  stop by our office during regular business hours and pick up a GoodRx coupon card.  ?- If you need your prescription sent electronically to a different pharmacy, notify our office through [facility] or by phone at [phone] option 4. ? ? ? ? ?Si Usted Necesita Algo Despu?s de Su Visita ? ?Tambi?n puede enviarnos un mensaje a trav?s de MyChart. Por lo general respondemos a los mensajes de MyChart en el transcurso de 1 a 2 d?as h?biles. ? ?Para renovar  recetas, por favor pida a su farmacia que se ponga en contacto con nuestra oficina. Nuestro n?mero de fax es el [phone]. ? ?Si tiene un asunto urgente cuando la cl?nica est? cerrada y que no puede esperar hasta el siguiente d?a h?bil, puede llamar/localizar a su doctor(a) al n?mero que aparece a continuaci?n.  ? ?Por favor, tenga en cuenta que aunque hacemos todo lo posible para estar disponibles para asuntos urgentes fuera del horario de oficina, no estamos disponibles las 24 horas del d?a, los 7 d?as de la semana.  ? ?Si tiene un problema urgente y no puede comunicarse con nosotros, puede optar por buscar atenci?n m?dica  en el consultorio de su doctor(a), en una cl?nica privada, en un centro de atenci?n urgente o en una sala de emergencias. ? ?Si tiene Engineer, maintenance (IT) m?dica, por favor llame inmediatamente al 911 o vaya a la sala de emergencias. ? ?N?meros de b?per ? ?- Dr. Nehemiah Massed: [phone] ? ?- Dra. Moye: [phone] ? ?- Dra. Nicole Kindred: [phone] ? ?En caso de inclemencias del tiempo, por favor llame a nuestra l?nea principal al [phone] para una actualizaci?n sobre el estado de cualquier retraso o cierre. ? ?Consejos para la medicaci?n en dermatolog?a: ?Por favor, guarde las cajas en las que vienen los medicamentos de uso t?pico para ayudarle a seguir las instrucciones sobre d?nde y c?mo usarlos. Las farmacias generalmente imprimen las instrucciones del medicamento s?lo en las cajas y no directamente en los tubos del [place].  ? ?Si su medicamento es muy caro, por favor, p?ngase en contacto con Zigmund Daniel llamando al [phone] y presione la opci?n 4 o env?enos un mensaje a trav?s de MyChart.  ? ?No podemos decirle cu?l ser? su copago por los medicamentos por adelantado ya que esto es diferente dependiendo de la cobertura de su seguro. Sin embargo, es posible que podamos encontrar un medicamento sustituto a Electrical engineer un formulario para que el seguro cubra el medicamento  que se considera necesario.  ? ?Si se requiere Ardelia Mems autorizaci?n previa para que su compa??a de seguros [place] su medicamento, por favor perm?tanos de 1 a 2 d?as h?biles para completar este proceso. ? ?Los precios de los medicamentos var?an con frecuencia dependiendo del Environmental consultant de d?nde se surte la receta y alguna farmacias pueden ofrecer precios m?s baratos. ? ?El sitio web www.goodrx.com tiene cupones para medicamentos de Airline pilot. Los precios aqu? no tienen en cuenta lo que podr?a costar con la ayuda del seguro (puede ser m?s barato con su seguro), pero el sitio web puede darle el precio si no utiliz? ning?n seguro.  ?- Puede imprimir el cup?n correspondiente y llevarlo con su receta a la farmacia.  ?- Tambi?n puede pasar por nuestra oficina durante el horario de atenci?n regular y recoger una tarjeta de cupones de GoodRx.  ?- Si necesita que su receta se env?e electr?nicamente a Chiropodist, informe a nuestra oficina a trav?s de MyChart de [place] o por tel?fono llamando al [phone] y presione la opci?n  4.  ? ?

## 2021-11-07 ENCOUNTER — Encounter: Payer: Self-pay | Admitting: Dermatology

## 2022-03-12 ENCOUNTER — Other Ambulatory Visit: Payer: Self-pay | Admitting: Internal Medicine

## 2022-03-12 DIAGNOSIS — R600 Localized edema: Secondary | ICD-10-CM

## 2022-03-22 ENCOUNTER — Ambulatory Visit
Admission: RE | Admit: 2022-03-22 | Discharge: 2022-03-22 | Disposition: A | Discharge status: Home / Self Care | Payer: 59 | Source: Ambulatory Visit | Attending: Internal Medicine | Admitting: Internal Medicine

## 2022-03-22 DIAGNOSIS — R600 Localized edema: Secondary | ICD-10-CM | POA: Diagnosis present

## 2022-05-09 ENCOUNTER — Ambulatory Visit: Payer: 59 | Admitting: Dermatology

## 2022-07-26 ENCOUNTER — Ambulatory Visit: Payer: 59 | Admitting: Dermatology

## 2022-08-13 ENCOUNTER — Ambulatory Visit: Payer: 59 | Admitting: Dermatology

## 2022-08-27 ENCOUNTER — Ambulatory Visit (INDEPENDENT_AMBULATORY_CARE_PROVIDER_SITE_OTHER): Payer: 59 | Admitting: Dermatology

## 2022-08-27 ENCOUNTER — Encounter: Payer: Self-pay | Admitting: Dermatology

## 2022-08-27 VITALS — BP 120/77 | HR 74

## 2022-08-27 DIAGNOSIS — Z79899 Other long term (current) drug therapy: Secondary | ICD-10-CM

## 2022-08-27 DIAGNOSIS — Z7189 Other specified counseling: Secondary | ICD-10-CM

## 2022-08-27 DIAGNOSIS — L710 Perioral dermatitis: Secondary | ICD-10-CM | POA: Diagnosis not present

## 2022-08-27 MED ORDER — DOXYCYCLINE 40 MG PO CPDR: 40.0000 mg | DELAYED_RELEASE_CAPSULE | ORAL | 6 refills | Status: AC

## 2022-08-27 MED ORDER — ACZONE 7.5 % EX GEL: 1.0000 "application " | Freq: Every day | CUTANEOUS | 11 refills | Status: AC

## 2022-08-27 NOTE — Progress Notes (Signed)
   Follow-Up Visit   Subjective  [name] is a 42 y.o. female who presents for the following: perioral dermatitis (6 month follow up, currently on doxycycline 40 mg qd, aczone 7.5 mg gel. Patient reports is currently staying clear and only using aczone as needed.).  The following portions of the chart were reviewed this encounter and updated as appropriate:  Tobacco  Allergies  Meds  Problems  Med Hx  Surg Hx  Fam Hx     Review of Systems: No other skin or systemic complaints except as noted in HPI or Assessment and Plan.  Objective  Well appearing patient in no apparent distress; mood and affect are within normal limits.  A focused examination was performed including face. Relevant physical exam findings are noted in the Assessment and Plan.   Assessment & Plan  Perioral dermatitis face, perioral area Chronic and persistent condition with duration or expected duration over one year. Condition is symptomatic / bothersome to patient. Not to goal.  Continue Doxycycline '40mg'$  once daily.  Doxycycline should be taken with food to prevent nausea. Do not lay down for 30 minutes after taking. Be cautious with sun exposure and use good sun protection while on this medication. Pregnant women should not take this medication.   Continue Aczone 7.5 mg once daily to face.  Topical retinoid medications like tretinoin/Retin-A, adapalene/Differin, tazarotene/Fabior, and Epiduo/Epiduo Forte can cause dryness and irritation when first started. Only apply a pea-sized amount to the entire affected area. Avoid applying it around the eyes, edges of mouth and creases at the nose. If you experience irritation, use a good moisturizer first and/or apply the medicine less often. If you are doing well with the medicine, you can increase how often you use it until you are applying every night. Be careful with sun protection while using this medication as it can make you sensitive to the sun. This  medicine should not be used by pregnant women.    ACZONE 7.5 % GEL - face, perioral area Apply 1 application  topically at bedtime. qhs to face doxycycline (ORACEA) 40 MG capsule - face, perioral area Take 1 capsule (40 mg total) by mouth every morning. Take with food and drink  I, Ruthell Rummage, CMA, am acting as scribe for Sarina Ser, MD.  Return in about 1 year (around 08/28/2023) for perioral dermatitis. Documentation: I have reviewed the above documentation for accuracy and completeness, and I agree with the above.  Sarina Ser, MD

## 2022-08-27 NOTE — Patient Instructions (Addendum)
Doxycycline should be taken with food to prevent nausea. Do not lay down for 30 minutes after taking. Be cautious with sun exposure and use good sun protection while on this medication. Pregnant women should not take this medication.   Topical retinoid medications like tretinoin/Retin-A, adapalene/Differin, tazarotene/Fabior, and Epiduo/Epiduo Forte can cause dryness and irritation when first started. Only apply a pea-sized amount to the entire affected area. Avoid applying it around the eyes, edges of mouth and creases at the nose. If you experience irritation, use a good moisturizer first and/or apply the medicine less often. If you are doing well with the medicine, you can increase how often you use it until you are applying every night. Be careful with sun protection while using this medication as it can make you sensitive to the sun. This medicine should not be used by pregnant women.       Due to recent changes in healthcare laws, you may see results of your pathology and/or laboratory studies on MyChart before the doctors have had a chance to review them. We understand that in some cases there may be results that are confusing or concerning to you. Please understand that not all results are received at the same time and often the doctors may need to interpret multiple results in order to provide you with the best plan of care or course of treatment. Therefore, we ask that you please give [place] 2 business days to thoroughly review all your results before contacting the office for clarification. Should we see a critical lab result, you will be contacted sooner.   If You Need Anything After Your Visit  If you have any questions or concerns for your doctor, please call our main line at [phone] and press option 4 to reach your doctor's medical assistant. If no one answers, please leave a voicemail as directed and we will return your call as soon as possible. Messages left after 4 pm will be  answered the following business day.   You may also send [place] a message via [place]. We typically respond to MyChart messages within 1-2 business days.  For prescription refills, please ask your pharmacy to contact our office. Our fax number is [phone].  If you have an urgent issue when the clinic is closed that cannot wait until the next business day, you can page your doctor at the number below.    Please note that while we do our best to be available for urgent issues outside of office hours, we are not available 24/7.   If you have an urgent issue and are unable to reach [place], you may choose to seek medical care at your doctor's office, retail clinic, urgent care center, or emergency room.  If you have a medical emergency, please immediately call 911 or go to the emergency department.  Pager Numbers  - Dr. Nehemiah Massed: [phone]  - Dr. Laurence Ferrari: [phone]  - Dr. Nicole Kindred: [phone]  In the event of inclement weather, please call our main line at [phone] for an update on the status of any delays or closures.  Dermatology Medication Tips: Please keep the boxes that topical medications come in in order to help keep track of the instructions about where and how to use these. Pharmacies typically print the medication instructions only on the boxes and not directly on the medication tubes.   If your medication is too expensive, please contact our office at [phone] option 4 or send [place] a message through [place].  We are unable to tell what your co-pay for medications will be in advance as this is different depending on your insurance coverage. However, we may be able to find a substitute medication at lower cost or fill out paperwork to get insurance to cover a needed medication.   If a prior authorization is required to get your medication covered by your insurance company, please allow [place] 1-2 business days to complete this process.  Drug prices often vary depending on  where the prescription is filled and some pharmacies may offer cheaper prices.  The website www.goodrx.com contains coupons for medications through different pharmacies. The prices here do not account for what the cost may be with help from insurance (it may be cheaper with your insurance), but the website can give you the price if you did not use any insurance.  - You can print the associated coupon and take it with your prescription to the pharmacy.  - You may also stop by our office during regular business hours and pick up a GoodRx coupon card.  - If you need your prescription sent electronically to a different pharmacy, notify our office through [facility] or by phone at [phone] option 4.     Si Usted Necesita Algo Despus de Su Visita  Tambin puede enviarnos un mensaje a travs de Pharmacist, community. Por lo general respondemos a los mensajes de MyChart en el transcurso de 1 a 2 das hbiles.  Para renovar recetas, por favor pida a su farmacia que se ponga en contacto con nuestra oficina. Harland Dingwall de fax es [place] [phone].  Si tiene un asunto urgente cuando la clnica est cerrada y que no puede esperar hasta el siguiente da hbil, puede llamar/localizar a su doctor(a) al nmero que aparece a continuacin.   Por favor, tenga en cuenta que aunque hacemos todo lo posible para estar disponibles para asuntos urgentes fuera del horario de [place], no estamos disponibles las 24 horas del da, los 7 das de la [place].   Si tiene un problema urgente y no puede comunicarse con nosotros, puede optar por buscar atencin mdica  en el consultorio de su doctor(a), en una clnica privada, en un centro de atencin urgente o en una sala de emergencias.  Si tiene Engineering geologist, por favor llame inmediatamente al 911 o vaya a la sala de emergencias.  Nmeros de bper  - Dr. Nehemiah Massed: [phone]  - Dra. Moye: [phone]  - Dra. Nicole Kindred: [phone]  En caso de inclemencias  del [place], por favor llame a [place]ie Kindred principal al [phone] para una actualizacin sobre el [place] de cualquier retraso o cierre.  Consejos para la medicacin en dermatologa: Por favor, guarde las cajas en las que vienen los medicamentos de uso tpico para ayudarle a seguir las instrucciones sobre dnde y cmo usarlos. Las farmacias generalmente imprimen las instrucciones del medicamento slo en las cajas y no directamente en los tubos del [place].   Si su medicamento es muy caro, por favor, pngase en contacto con Zigmund Daniel llamando al [phone] y presione la opcin 4 o envenos un mensaje a travs de Pharmacist, community.   No podemos decirle cul ser su copago por los medicamentos por adelantado ya que esto es diferente dependiendo de la cobertura de su seguro. Sin embargo, es posible que podamos encontrar un medicamento sustituto a Electrical engineer un formulario para que el seguro cubra el medicamento que se considera necesario.   Si se requiere una autorizacin previa para que su  compaa de seguros [place] su medicamento, por favor permtanos de 1 a 2 das hbiles para completar este proceso.  Los precios de los medicamentos varan con frecuencia dependiendo del Environmental consultant de dnde se surte la receta y alguna farmacias pueden ofrecer precios ms baratos.  El sitio web www.goodrx.com tiene cupones para medicamentos de Airline pilot. Los precios aqu no tienen en cuenta lo que podra costar con la ayuda del seguro (puede ser ms barato con su seguro), pero el sitio web puede darle el precio si no utiliz Research scientist (physical sciences).  - Puede imprimir el cupn correspondiente y llevarlo con su receta a la farmacia.  - Tambin puede pasar por nuestra oficina durante el horario de atencin regular y Charity fundraiser una tarjeta de cupones de GoodRx.  - Si necesita que su receta se enve electrnicamente a una farmacia diferente, informe a nuestra oficina a travs de MyChart de [place] o por telfono  llamando al [phone] y presione la opcin 4.

## 2022-09-04 ENCOUNTER — Encounter: Payer: Self-pay | Admitting: Dermatology

## 2022-12-11 ENCOUNTER — Other Ambulatory Visit: Payer: Self-pay | Admitting: Internal Medicine

## 2022-12-11 DIAGNOSIS — R109 Unspecified abdominal pain: Secondary | ICD-10-CM

## 2022-12-11 DIAGNOSIS — R202 Paresthesia of skin: Secondary | ICD-10-CM

## 2022-12-18 ENCOUNTER — Ambulatory Visit
Admission: RE | Admit: 2022-12-18 | Discharge: 2022-12-18 | Disposition: A | Discharge status: Home / Self Care | Payer: 59 | Source: Ambulatory Visit | Attending: Internal Medicine | Admitting: Internal Medicine

## 2022-12-18 DIAGNOSIS — R202 Paresthesia of skin: Secondary | ICD-10-CM | POA: Insufficient documentation

## 2022-12-18 DIAGNOSIS — R109 Unspecified abdominal pain: Secondary | ICD-10-CM | POA: Diagnosis present

## 2022-12-18 MED ORDER — IOHEXOL 300 MG/ML  SOLN
100.0000 mL | Freq: Once | INTRAMUSCULAR | Status: AC | PRN
  Administered 2022-12-18: 100 mL via INTRAVENOUS

## 2022-12-19 ENCOUNTER — Other Ambulatory Visit: Payer: Self-pay | Admitting: Internal Medicine

## 2022-12-19 DIAGNOSIS — R29898 Other symptoms and signs involving the musculoskeletal system: Secondary | ICD-10-CM

## 2022-12-19 DIAGNOSIS — R200 Anesthesia of skin: Secondary | ICD-10-CM

## 2023-03-12 ENCOUNTER — Other Ambulatory Visit: Payer: Self-pay | Admitting: Internal Medicine

## 2023-03-12 DIAGNOSIS — Z1231 Encounter for screening mammogram for malignant neoplasm of breast: Secondary | ICD-10-CM

## 2023-03-13 ENCOUNTER — Ambulatory Visit: Payer: 59

## 2023-03-14 ENCOUNTER — Other Ambulatory Visit: Payer: Self-pay | Admitting: Internal Medicine

## 2023-03-14 DIAGNOSIS — R200 Anesthesia of skin: Secondary | ICD-10-CM

## 2023-03-14 DIAGNOSIS — R29898 Other symptoms and signs involving the musculoskeletal system: Secondary | ICD-10-CM

## 2023-03-16 ENCOUNTER — Ambulatory Visit
Admission: RE | Admit: 2023-03-16 | Discharge: 2023-03-16 | Disposition: A | Discharge status: Home / Self Care | Payer: 59 | Source: Ambulatory Visit | Attending: Internal Medicine | Admitting: Internal Medicine

## 2023-03-16 DIAGNOSIS — R29898 Other symptoms and signs involving the musculoskeletal system: Secondary | ICD-10-CM | POA: Insufficient documentation

## 2023-03-16 DIAGNOSIS — R200 Anesthesia of skin: Secondary | ICD-10-CM | POA: Insufficient documentation

## 2023-04-03 ENCOUNTER — Ambulatory Visit
Admission: RE | Admit: 2023-04-03 | Discharge: 2023-04-03 | Disposition: A | Discharge status: Home / Self Care | Payer: 59 | Source: Ambulatory Visit | Attending: Internal Medicine | Admitting: Internal Medicine

## 2023-04-03 DIAGNOSIS — Z1231 Encounter for screening mammogram for malignant neoplasm of breast: Secondary | ICD-10-CM | POA: Diagnosis present

## 2023-04-17 ENCOUNTER — Other Ambulatory Visit: Payer: Self-pay

## 2023-04-18 LAB — SURGICAL PATHOLOGY

## 2023-05-01 ENCOUNTER — Other Ambulatory Visit: Payer: Self-pay

## 2023-05-01 ENCOUNTER — Emergency Department: Payer: 59

## 2023-05-01 DIAGNOSIS — I10 Essential (primary) hypertension: Secondary | ICD-10-CM | POA: Diagnosis not present

## 2023-05-01 DIAGNOSIS — J181 Lobar pneumonia, unspecified organism: Secondary | ICD-10-CM | POA: Insufficient documentation

## 2023-05-01 DIAGNOSIS — G43809 Other migraine, not intractable, without status migrainosus: Secondary | ICD-10-CM | POA: Diagnosis not present

## 2023-05-01 DIAGNOSIS — Z20822 Contact with and (suspected) exposure to covid-19: Secondary | ICD-10-CM | POA: Insufficient documentation

## 2023-05-01 DIAGNOSIS — R059 Cough, unspecified: Secondary | ICD-10-CM | POA: Diagnosis present

## 2023-05-01 LAB — RESP PANEL BY RT-PCR (RSV, FLU A&B, COVID)  RVPGX2
Influenza A by PCR: NEGATIVE
Influenza B by PCR: NEGATIVE
Resp Syncytial Virus by PCR: NEGATIVE
SARS Coronavirus 2 by RT PCR: NEGATIVE

## 2023-05-01 LAB — BASIC METABOLIC PANEL
Anion gap: 12 (ref 5–15)
BUN: 9 mg/dL (ref 6–20)
CO2: 21 mmol/L — ABNORMAL LOW (ref 22–32)
Calcium: 9 mg/dL (ref 8.9–10.3)
Chloride: 98 mmol/L (ref 98–111)
Creatinine, Ser: 0.63 mg/dL (ref 0.44–1.00)
GFR, Estimated: >60 mL/min (ref >60)
Glucose, Bld: 126 mg/dL — ABNORMAL HIGH (ref 70–99)
Potassium: 3 mmol/L — ABNORMAL LOW (ref 3.5–5.1)
Sodium: 131 mmol/L — ABNORMAL LOW (ref 135–145)

## 2023-05-01 LAB — CBC WITH DIFFERENTIAL/PLATELET
Abs Immature Granulocytes: 0.07 10*3/uL (ref 0.00–0.07)
Basophils Absolute: 0 10*3/uL (ref 0.0–0.1)
Basophils Relative: 0 %
Eosinophils Absolute: 0 10*3/uL (ref 0.0–0.5)
Eosinophils Relative: 0 %
HCT: 34.1 % — ABNORMAL LOW (ref 36.0–46.0)
Hemoglobin: 12.5 g/dL (ref 12.0–15.0)
Immature Granulocytes: 1 %
Lymphocytes Relative: 4 %
Lymphs Abs: 0.4 10*3/uL — ABNORMAL LOW (ref 0.7–4.0)
MCH: 31.3 pg (ref 26.0–34.0)
MCHC: 36.7 g/dL — ABNORMAL HIGH (ref 30.0–36.0)
MCV: 85.5 fL (ref 80.0–100.0)
Monocytes Absolute: 0.4 10*3/uL (ref 0.1–1.0)
Monocytes Relative: 5 %
Neutro Abs: 8.1 10*3/uL — ABNORMAL HIGH (ref 1.7–7.7)
Neutrophils Relative %: 90 %
Platelets: 270 10*3/uL (ref 150–400)
RBC: 3.99 MIL/uL (ref 3.87–5.11)
RDW: 12.1 % (ref 11.5–15.5)
WBC: 9 10*3/uL (ref 4.0–10.5)
nRBC: 0 % (ref 0.0–0.2)

## 2023-05-01 NOTE — ED Triage Notes (Signed)
Pt reports headache and fever x3 days. Pt reports temperature at home of 104. Pt took tylenol 1 hour PTA. Pt denies numbness or weakness. Speech clear.

## 2023-05-02 ENCOUNTER — Emergency Department
Admission: EM | Admit: 2023-05-02 | Discharge: 2023-05-02 | Disposition: A | Discharge status: Home / Self Care | Payer: 59 | Attending: Emergency Medicine | Admitting: Emergency Medicine

## 2023-05-02 DIAGNOSIS — G43809 Other migraine, not intractable, without status migrainosus: Secondary | ICD-10-CM

## 2023-05-02 DIAGNOSIS — J189 Pneumonia, unspecified organism: Secondary | ICD-10-CM

## 2023-05-02 MED ORDER — AMOXICILLIN-POT CLAVULANATE 875-125 MG PO TABS
1.0000 | ORAL_TABLET | Freq: Once | ORAL | Status: AC
  Administered 2023-05-02: 1 via ORAL
  Filled 2023-05-02: qty 1

## 2023-05-02 MED ORDER — SODIUM CHLORIDE 0.9 % IV BOLUS
1000.0000 mL | Freq: Once | INTRAVENOUS | Status: AC
  Administered 2023-05-02: 1000 mL via INTRAVENOUS

## 2023-05-02 MED ORDER — DIPHENHYDRAMINE HCL 50 MG/ML IJ SOLN
25.0000 mg | Freq: Once | INTRAMUSCULAR | Status: AC
  Administered 2023-05-02: 25 mg via INTRAVENOUS
  Filled 2023-05-02: qty 1

## 2023-05-02 MED ORDER — KETOROLAC TROMETHAMINE 15 MG/ML IJ SOLN
15.0000 mg | Freq: Once | INTRAMUSCULAR | Status: AC
  Administered 2023-05-02: 15 mg via INTRAVENOUS
  Filled 2023-05-02: qty 1

## 2023-05-02 MED ORDER — ACETAMINOPHEN 500 MG PO TABS
1000.0000 mg | ORAL_TABLET | Freq: Once | ORAL | Status: AC
  Administered 2023-05-02: 1000 mg via ORAL
  Filled 2023-05-02: qty 2

## 2023-05-02 MED ORDER — PROCHLORPERAZINE EDISYLATE 10 MG/2ML IJ SOLN
10.0000 mg | Freq: Once | INTRAMUSCULAR | Status: AC
  Administered 2023-05-02: 10 mg via INTRAVENOUS
  Filled 2023-05-02: qty 2

## 2023-05-02 MED ORDER — DEXAMETHASONE SODIUM PHOSPHATE 10 MG/ML IJ SOLN
10.0000 mg | Freq: Once | INTRAMUSCULAR | Status: AC
  Administered 2023-05-02: 10 mg via INTRAVENOUS
  Filled 2023-05-02: qty 1

## 2023-05-02 MED ORDER — AMOXICILLIN-POT CLAVULANATE 875-125 MG PO TABS: 1.0000 | ORAL_TABLET | Freq: Two times a day (BID) | ORAL | 0 refills | Status: AC

## 2023-05-02 MED ORDER — MAGNESIUM SULFATE 2 GM/50ML IV SOLN
2.0000 g | Freq: Once | INTRAVENOUS | Status: AC
  Administered 2023-05-02: 2 g via INTRAVENOUS
  Filled 2023-05-02: qty 50

## 2023-05-02 NOTE — ED Provider Notes (Signed)
[facility] Provider Note    Event Date/Time   First MD Initiated Contact with Patient 05/02/23 0327     (approximate)   History   Headache   HPI  [name] is a 42 y.o. female   Past medical history of fibromyalgia, rheumatoid arthritis, hypertension, migraine headaches, who presents to the emergency department with congestion, cough, myalgias, neck pain, headache for the last several days.  She has had intermittent fevers and has been taking Tylenol with good effect.  She denies any GI or GU complaints.  External Medical Documents Reviewed: Rheumatology clinic telephone encounter documenting her current Actrema infusions      Physical Exam   Triage Vital Signs: ED Triage Vitals  Encounter Vitals Group     BP 05/01/23 2223 (!) 123/92     Systolic BP Percentile --      Diastolic BP Percentile --      Pulse Rate 05/01/23 2223 97     Resp 05/01/23 2223 18     Temp 05/01/23 2223 98.2 F (36.8 C)     Temp Source 05/01/23 2223 Oral     SpO2 05/01/23 2223 96 %     Weight 05/01/23 2223 175 lb (79.4 kg)     Height 05/01/23 2223 5\' 3"  (1.6 m)     Head Circumference --      Peak Flow --      Pain Score 05/01/23 2228 10     Pain Loc --      Pain Education --      Exclude from Growth Chart --     Most recent vital signs: Vitals:   05/01/23 2223 05/02/23 0439  BP: (!) 123/92 126/77  Pulse: 97 84  Resp: 18 16  Temp: 98.2 F (36.8 C)   SpO2: 96% 97%    General: Awake, no distress.  CV:  Good peripheral perfusion.  Resp:  Normal effort.  Abd:  No distention.  Other:  Awake alert comfortable appearing with normal vital signs and afebrile.  Her neck is supple with full range of motion.  She appears nontoxic.  She has clear lungs and a soft nontender abdomen.  She appears euvolemic.  She is moving all extremities with full active range of motion.   ED Results / Procedures / Treatments   Labs (all labs ordered are listed, but only  abnormal results are displayed) Labs Reviewed  CBC WITH DIFFERENTIAL/PLATELET - Abnormal; Notable for the following components:      Result Value   HCT 34.1 (*)    MCHC 36.7 (*)    Neutro Abs 8.1 (*)    Lymphs Abs 0.4 (*)    All other components within normal limits  BASIC METABOLIC PANEL - Abnormal; Notable for the following components:   Sodium 131 (*)    Potassium 3.0 (*)    CO2 21 (*)    Glucose, Bld 126 (*)    All other components within normal limits  RESP PANEL BY RT-PCR (RSV, FLU A&B, COVID)  [id number]     I ordered and reviewed the above labs they are notable for mild hypokalemia otherwise electrolytes unremarkable and cell counts unremarkable    RADIOLOGY I independently reviewed and interpreted x-ray of the chest and I see left-sided opacity consistent with pneumonia I also reviewed radiologist's formal read.   PROCEDURES:  Critical Care performed: No  Procedures   MEDICATIONS ORDERED IN ED: Medications  amoxicillin-clavulanate (AUGMENTIN) 875-125 MG per tablet 1 tablet (has  no administration in time range)  prochlorperazine (COMPAZINE) injection 10 mg (10 mg Intravenous Given 05/02/23 0426)  sodium chloride 0.9 % bolus 1,000 mL (0 mLs Intravenous Stopped 05/02/23 0529)  acetaminophen (TYLENOL) tablet 1,000 mg (1,000 mg Oral Given 05/02/23 0427)  ketorolac (TORADOL) 15 MG/ML injection 15 mg (15 mg Intravenous Given 05/02/23 0426)  diphenhydrAMINE (BENADRYL) injection 25 mg (25 mg Intravenous Given 05/02/23 0427)  magnesium sulfate IVPB 2 g 50 mL (0 g Intravenous Stopped 05/02/23 0529)  dexamethasone (DECADRON) injection 10 mg (10 mg Intravenous Given 05/02/23 0427)     IMPRESSION / MDM / ASSESSMENT AND PLAN / ED COURSE  I reviewed the triage vital signs and the nursing notes.                                Patient's presentation is most consistent with acute presentation with potential threat to life or bodily function.  Differential diagnosis includes,  but is not limited to, viral illness, pneumonia, considered but less likely meningitis or sepsis   The patient is on the cardiac monitor to evaluate for evidence of arrhythmia and/or significant heart rate changes.  MDM:    Patient with a history of migraine headache with a migraine headache today in the setting of viral illness and cough.  Also fevers at home, none here.  Appears well and nontoxic.  I doubt sepsis today and I doubt meningitis as well.  She does have evidence of pneumonia on chest x-ray.  Migraine headache completely resolved with migraine cocktail.  Given first dose of Augmentin here for pneumonia, prescription for the same.  Since she is well-appearing does not appear septic, I think she can manage with outpatient therapy and can follow-up with her PMD and return with worsening.       FINAL CLINICAL IMPRESSION(S) / ED DIAGNOSES   Final diagnoses:  Other migraine without status migrainosus, not intractable  Pneumonia of left upper lobe due to infectious organism     Rx / DC Orders   ED Discharge Orders          Ordered    amoxicillin-clavulanate (AUGMENTIN) 875-125 MG tablet  2 times daily        05/02/23 0644             Note:  This document was prepared using Dragon voice recognition software and may include unintentional dictation errors.    Pilar Jarvis, MD 05/02/23 [phone]

## 2023-05-02 NOTE — Discharge Instructions (Addendum)
Take antibiotic for the full course as prescribed.    Take acetaminophen 650 mg and ibuprofen 400 mg every 6 hours for pain.  Take with food.  Thank you for choosing [place] for your health care today!  Please see your primary doctor this week for a follow up appointment.   If you have any new, worsening, or unexpected symptoms call your doctor right away or come back to the emergency department for reevaluation.  It was my pleasure to care for you today.   Daneil Dan Modesto Charon, MD

## 2023-08-29 ENCOUNTER — Ambulatory Visit: Payer: 59 | Admitting: Dermatology

## 2023-09-28 ENCOUNTER — Emergency Department
Admission: EM | Admit: 2023-09-28 | Discharge: 2023-09-28 | Discharge status: Against Medical Advice | Attending: Emergency Medicine | Admitting: Emergency Medicine

## 2023-09-28 DIAGNOSIS — T7421XA Adult sexual abuse, confirmed, initial encounter: Secondary | ICD-10-CM | POA: Diagnosis present

## 2023-09-28 DIAGNOSIS — Z5329 Procedure and treatment not carried out because of patient's decision for other reasons: Secondary | ICD-10-CM | POA: Insufficient documentation

## 2023-09-28 DIAGNOSIS — I10 Essential (primary) hypertension: Secondary | ICD-10-CM | POA: Insufficient documentation

## 2023-09-28 DIAGNOSIS — Z79899 Other long term (current) drug therapy: Secondary | ICD-10-CM | POA: Insufficient documentation
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
## 2023-09-28 NOTE — ED Notes (Signed)
 Pt consents for [employer] and SANE RN to be notified. [employer] notified and states they will send officers to bedside to take report, and SANE RN Melissa notified.
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
## 2023-09-28 NOTE — ED Notes (Signed)
 At approximately 0630, registration notified me that the patient was not in her room. After searching the department, bathrooms, etc., it was concluded that the patient eloped. Provider, SANE nurse, Charge Nurse, and attending Emergency planning/management officer notified.
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
## 2023-09-28 NOTE — ED Triage Notes (Signed)
 Pt to ED requesting SANE services for an encounter earlier this morning.

## 2023-12-31 ENCOUNTER — Other Ambulatory Visit: Payer: Self-pay

## 2023-12-31 ENCOUNTER — Encounter: Payer: Self-pay | Admitting: Emergency Medicine

## 2023-12-31 ENCOUNTER — Emergency Department
Admission: EM | Admit: 2023-12-31 | Discharge: 2023-12-31 | Disposition: A | Discharge status: Home / Self Care | Source: Ambulatory Visit | Attending: Emergency Medicine | Admitting: Emergency Medicine

## 2023-12-31 ENCOUNTER — Emergency Department

## 2023-12-31 DIAGNOSIS — R0600 Dyspnea, unspecified: Secondary | ICD-10-CM | POA: Insufficient documentation

## 2023-12-31 DIAGNOSIS — R0602 Shortness of breath: Secondary | ICD-10-CM | POA: Diagnosis present

## 2023-12-31 DIAGNOSIS — I10 Essential (primary) hypertension: Secondary | ICD-10-CM | POA: Insufficient documentation

## 2023-12-31 DIAGNOSIS — E876 Hypokalemia: Secondary | ICD-10-CM | POA: Insufficient documentation

## 2023-12-31 LAB — TROPONIN I (HIGH SENSITIVITY): Troponin I (High Sensitivity): 16 ng/L (ref <18)

## 2023-12-31 LAB — COMPREHENSIVE METABOLIC PANEL WITH GFR
ALT: 26 U/L (ref 0–44)
AST: 30 U/L (ref 15–41)
Albumin: 4.4 g/dL (ref 3.5–5.0)
Alkaline Phosphatase: 37 U/L — ABNORMAL LOW (ref 38–126)
Anion gap: 12 (ref 5–15)
BUN: 22 mg/dL — ABNORMAL HIGH (ref 6–20)
CO2: 23 mmol/L (ref 22–32)
Calcium: 9.7 mg/dL (ref 8.9–10.3)
Chloride: 101 mmol/L (ref 98–111)
Creatinine, Ser: 1.04 mg/dL — ABNORMAL HIGH (ref 0.44–1.00)
GFR, Estimated: >60 mL/min (ref >60)
Glucose, Bld: 111 mg/dL — ABNORMAL HIGH (ref 70–99)
Potassium: 2.9 mmol/L — ABNORMAL LOW (ref 3.5–5.1)
Sodium: 136 mmol/L (ref 135–145)
Total Bilirubin: 1 mg/dL (ref 0.0–1.2)
Total Protein: 7.1 g/dL (ref 6.5–8.1)

## 2023-12-31 LAB — BRAIN NATRIURETIC PEPTIDE: B Natriuretic Peptide: 6.6 pg/mL (ref 0.0–100.0)

## 2023-12-31 LAB — CBC
HCT: 40.7 % (ref 36.0–46.0)
Hemoglobin: 15.2 g/dL — ABNORMAL HIGH (ref 12.0–15.0)
MCH: 31.7 pg (ref 26.0–34.0)
MCHC: 37.3 g/dL — ABNORMAL HIGH (ref 30.0–36.0)
MCV: 85 fL (ref 80.0–100.0)
Platelets: 262 10*3/uL (ref 150–400)
RBC: 4.79 MIL/uL (ref 3.87–5.11)
RDW: 12.6 % (ref 11.5–15.5)
WBC: 8.1 10*3/uL (ref 4.0–10.5)
nRBC: 0 % (ref 0.0–0.2)

## 2023-12-31 MED ORDER — SODIUM CHLORIDE 0.9 % IV BOLUS
1000.0000 mL | Freq: Once | INTRAVENOUS | Status: AC
  Administered 2023-12-31: 1000 mL via INTRAVENOUS

## 2023-12-31 MED ORDER — IOHEXOL 350 MG/ML SOLN
75.0000 mL | Freq: Once | INTRAVENOUS | Status: AC | PRN
  Administered 2023-12-31: 75 mL via INTRAVENOUS

## 2023-12-31 MED ORDER — POTASSIUM CHLORIDE CRYS ER 10 MEQ PO TBCR: 10.0000 meq | EXTENDED_RELEASE_TABLET | Freq: Two times a day (BID) | ORAL | 0 refills | Status: DC

## 2023-12-31 MED ORDER — POTASSIUM CHLORIDE CRYS ER 10 MEQ PO TBCR: 10.0000 meq | EXTENDED_RELEASE_TABLET | Freq: Two times a day (BID) | ORAL | 0 refills | Status: AC

## 2023-12-31 NOTE — Progress Notes (Signed)
 History of Present Illness [name] is a 43 year old female with hypertension and aortic atherosclerosis who presents with fatigue and dyspnea on exertion.  She experiences significant fatigue and dyspnea, particularly on exertion, which began shortly after donating plasma yesterday. She feels lightheaded, nauseous, and unable to catch her breath, requiring frequent stops while walking. She also experienced clamminess and a racing heart, and slept for 11 hours following the episode. Despite initial improvement, symptoms persisted at work today, with continued shortness of breath on minimal exertion. No wheezing, coughing, or significant leg swelling, though she notes some mild swelling. No recent falls, injuries, or illnesses, and no pain when taking deep breaths, though she finds it difficult to take a full breath. No itching or swelling at the plasma donation site.  She has a history of rheumatoid arthritis, which has been relatively stable, though her current medication regimen is not lasting as long as before. She has not yet started Plaquenil due to financial constraints. Occasional swelling and bruising of her fingers, attributed to her rheumatoid arthritis.  She has a history of hypertension and aortic atherosclerosis. Her hypertension is reasonably controlled on her current regimen. She also has a history of depression and anxiety, which she feels are reasonably well-managed on her current medication. She describes ongoing social stressors, including a recent traumatic personal event and financial difficulties, which have contributed to her stress levels.  Current Outpatient Medications  Medication Sig Dispense Refill  . ACTEMRA 400 mg/20 mL (20 mg/mL) injection Inject 32.3 mLs (645 mg total) into the vein every 28 (twenty-eight) days 40 mL 11  . ALPRAZolam (XANAX) 0.25 MG tablet Take 1 tablet (0.25 mg total) by mouth 2 (two) times daily as needed for Sleep or Anxiety 45 tablet 3  .  buPROPion (WELLBUTRIN XL) 150 MG XL tablet TAKE 2 TABLETS BY MOUTH ONCE DAILY. 180 tablet 3  . celecoxib (CELEBREX) 200 MG capsule Take 1 capsule (200 mg total) by mouth 2 (two) times daily 60 capsule 5  . cholecalciferol (VITAMIN D3) 1000 unit tablet Take 1,000 Units by mouth every morning    . CYANOCOBALAMIN, VITAMIN B-12, ORAL Take 1,000 mcg by mouth every morning 1000 mcg daily    . dapsone  7.5 % GlwP Apply topically nightly as needed    . fenofibrate nanocrystallized (TRICOR) 145 MG tablet TAKE 1 TABLET BY MOUTH ONCE DAILY. 90 tablet 3  . folic acid (FOLVITE) 1 MG tablet Take 1 tablet (1 mg total) by mouth once daily 90 tablet 3  . HYDROcodone -acetaminophen  (NORCO) 7.5-325 mg tablet Take 1 tablet by mouth every 12 (twelve) hours as needed for Pain 60 tablet 0  . hydroxychloroquine (PLAQUENIL) 200 mg tablet Take 1 tablet (200 mg total) by mouth 2 (two) times daily 60 tablet 5  . methotrexate (RHEUMATREX) 2.5 MG tablet Take 8 tablets (20 mg total) by mouth every 7 (seven) days 96 tablet 1  . pregabalin (LYRICA) 25 MG capsule Take 1 capsule (25 mg total) by mouth 2 (two) times daily 60 capsule 4  . rosuvastatin (CRESTOR) 5 MG tablet Take 1 tablet (5 mg total) by mouth once a week 12 tablet 3  . sertraline  (ZOLOFT ) 100 MG tablet TAKE 2 TABLETS BY MOUTH ONCE DAILY 180 tablet 3  . spironolactone (ALDACTONE) 50 MG tablet Take 1 tablet by mouth once daily. 90 tablet 2  . triamterene-hydroCHLOROthiazide  (MAXZIDE-25) 37.5-25 mg tablet take 1 tablet by mouth every day 90 tablet 3  . varenicline tartrate (CHANTIX STARTING MONTH  PAK) tablet TAKE AS DIRECTED 53 tablet 0   No current facility-administered medications for this visit.    Allergies as of 12/31/2023  . (No Known Allergies)    Past Medical History:  Diagnosis Date  . Anemia   . Anxiety 2017   Postpartum   . COVID-19   . Cystocele with prolapse   . Depression 2017   Postpartum   . Fracture of fifth toe, right, closed   .  Glomerulonephritis 1995   H/o this at age 55, was monitored for a time and no problems since  . Hyperlipidemia October 2015  . Hypertension   . Kidney infection   . Migraine   . PONV (postoperative nausea and vomiting)    with gallbladder surgery.  . Rheumatoid arthritis (CMS/HHS-HCC) July 2018    Past Surgical History:  Procedure Laterality Date  . CHOLECYSTECTOMY  11/2013  . TUBAL LIGATION Bilateral 08/2016  . ENDOMETRIAL ABLATION  2019   [facility]  . HYSTEROSCOPY W/ ENDOMETRIAL ABLATION  2019  . Endoscopic right carpal tunnel release Right 04/23/2018   Dr.Poggi   . COLPORRHAPHY FOR REPAIR RECTOCELE POSTERIOR N/A 11/22/2020   Procedure: POSTERIOR COLPORRHAPHY, REPAIR OF RECTOCELE WITH OR WITHOUT PERINEORRHAPHY;  Surgeon: Verneda Donnice Piles, MD;  Location: [facility] OR;  Service: Gynecology;  Laterality: N/A;  . ANTERIOR AND POSTERIOR VAGINAL REPAIR    . BLADDER SURGERY  2018   Anterior repair  . DILATION AND CURETTAGE OF UTERUS    . HYSTERECTOMY    . HYSTEROSCOPY    . LAPAROSCOPIC SALPINGECTOMY    . TONSILLECTOMY      The patient has no Health Maintenance topics of status Overdue, Due On, or Due Soon   Family History  Problem Relation Name Age of Onset  . Alcohol abuse Mother Florine   . Depression Mother Florine   . Hyperlipidemia (Elevated cholesterol) Mother Florine   . High blood pressure (Hypertension) Mother Florine   . Thyroid  disease Mother Florine   . Osteoarthritis Mother Florine   . Diabetes type II Mother Florine   . Lupus Mother Florine   . Alcohol abuse Father Rickey   . Coronary Artery Disease (Blocked arteries around heart) Father Rickey 45       died  . Depression Father Rickey   . Hyperlipidemia (Elevated cholesterol) Father Rickey   . High blood pressure (Hypertension) Father Rickey   . Cervical cancer Sister    . Irregular Heart Beat (Arrhythmia) Sister    . Hip fracture Maternal Grandmother Irma   . High blood pressure (Hypertension)  Maternal Grandmother Irma   . Osteoarthritis Maternal Grandmother Irma   . Osteoarthritis Paternal Jeannine Rockers   . Pancreatic cancer Paternal Jeannine Rockers   . Alzheimer's disease Paternal Grandfather Richard   . High blood pressure (Hypertension) Paternal Grandfather Richard   . Kidney failure Paternal Grandfather Richard        required dialysis  . No Known Problems Son    . No Known Problems Son    . No Known Problems Son    . Lupus Cousin    . Breast cancer Other great aunt 66  . Rheum arthritis Other great grandmother   . Cancer Maternal Aunt Helen        Breast cancer  . Cancer Paternal Aunt Hazel        Breast cancer  . Cancer Sister Anette Dragon        Cervical cancer  . Cancer Maternal Aunt Deresa  Breast cancer  . Anesthesia problems Neg Hx      Social History   Socioeconomic History  . Marital status: Married  Occupational History  . Occupation: [employer]  Tobacco Use  . Smoking status: Former    Current packs/day: 0.00    Average packs/day: 0.3 packs/day for 16.0 years (4.0 ttl pk-yrs)    Types: Cigarettes    Start date: 10/30/2000    Quit date: 10/30/2016    Years since quitting: 7.1    Passive exposure: Past  . Smokeless tobacco: Never  Vaping Use  . Vaping status: Never Used  Substance and Sexual Activity  . Alcohol use: No  . Drug use: No  . Sexual activity: Yes    Partners: Male    Birth control/protection: Other-see comments    Comment: Hysterectomy  Other Topics Concern  . Would you please tell us  about the people who live in your home, your pets, or anything else important to your social life? No   Social Drivers of Corporate investment banker Strain: Low Risk  (03/27/2023)   Overall Financial Resource Strain (CARDIA)   . Difficulty of Paying Living Expenses: Not hard at all  Food Insecurity: No Food Insecurity (03/27/2023)   Hunger Vital Sign   . Worried About Programme researcher, broadcasting/film/video in the Last Year: Never true   . Ran Out of Food in  the Last Year: Never true  Transportation Needs: No Transportation Needs (03/27/2023)   PRAPARE - Transportation   . Lack of Transportation (Medical): No   . Lack of Transportation (Non-Medical): No    Goals Addressed               This Visit's Progress   . * Exercise (x goals) (pt-stated)   On track     Walk more    . * Lose Weight (pt-stated)   On track   . * Maintain health/healthy lifestyle (pt-stated)   On track        BP 120/89   Pulse 94   Ht 160 cm (5' 3)   Wt 85.7 kg (189 lb)   LMP 10/27/2019 (Exact Date)   SpO2 96%   BMI 33.48 kg/m   General.   pleasant patient, in no significant distress Neck: Increased muscle tension without point tenderness.  Trachea midline.  No thyromegaly Lungs. Clear to auscultation bilaterally without wheeze or retractions. Cardiovascular.Regular rate and rhythm without murmurs, gallops, or rubs.  Carotid and radial pulses 2+.   Abdomen. Soft;  non distended; no masses or organomegaly.   Extremities.No clubbing, cyanosis.  No significant edema.  No active synovitis.  Antalgic gait due to right hamstring injury   Dyspnea on exertion  (primary encounter diagnosis) Benign essential hypertension Aortic atherosclerosis () Hypertriglyceridemia Immunosuppression (CMS/HHS-HCC) Anxiety, generalized Vitamin D deficiency Rheumatoid arthritis involving multiple sites with positive rheumatoid factor (CMS/HHS-HCC) Chronic fatigue  Assessment & Plan Dyspnea on exertion and tachycardia Symptoms suggest anemia, cardiac changes, lung changes, or clot. Onset post-plasma donation raises suspicion of reaction or exacerbation of underlying conditions. - Order EKG. - Check blood counts. - Order D-dimer. - Order troponin. - Order chest x-ray.   Addendum: EKG reveals ST depression across the inferior leads with ST elevation in aVR which is worrisome for acute right-sided myocardial infarction/ischemia.  Based on these findings or symptoms,  emergency room transfer is recommended.  Discussed with patient and she is in agreement.   Hypertension Hypertension well-controlled on current regimen. Blood pressure decreased during visit. -  Continue current hypertension regimen. - Monitor blood pressure at home.  Aortic atherosclerosis Receiving treatment. No recent vascular symptoms, exam reassuring.  Social stressors and anxiety Experiencing social stressors and anxiety due to recent traumatic event. Managing reasonably well with a safety plan in place.  BERT JACK KLEIN III, MD  Portions of this note were created using dictation software and may contain typographical errors.  This note has been created using automated tools and reviewed for accuracy by BERT JACK KLEIN III.  *Some images could not be shown.

## 2023-12-31 NOTE — ED Provider Notes (Signed)
 [facility] Provider Note    Event Date/Time   First MD Initiated Contact with Patient 12/31/23 1506     (approximate)  History   Chief Complaint: Abnormal ECG  HPI  [name] is a 43 y.o. female with a past medical history of anemia, gastric reflux, hypertension, rheumatoid arthritis, presents to the emergency department for shortness of breath.  According to the patient she donated plasma yesterday, within several hours after that she began feeling short of breath, worse with exertion.  Shortness of breath continued to worsen today.  Patient saw her doctor they did an EKG and referred her to the emergency department for further workup given abnormal appearance.  Patient denies any chest pain but states shortness of breath, worse with any exertion.  Patient is somewhat tachycardic around 100 to 110 bpm and currently satting in the mid upper 90s on room air.  Denies any fever or cough.  No abdominal pain.  No leg pain or swelling.  No history of PE or DVT previously.  Physical Exam   Triage Vital Signs: ED Triage Vitals  Encounter Vitals Group     BP 12/31/23 1503 (!) 136/91     Girls Systolic BP Percentile --      Girls Diastolic BP Percentile --      Boys Systolic BP Percentile --      Boys Diastolic BP Percentile --      Pulse Rate 12/31/23 1503 100     Resp 12/31/23 1503 17     Temp 12/31/23 1503 98.3 F (36.8 C)     Temp Source 12/31/23 1503 Oral     SpO2 12/31/23 1503 97 %     Weight 12/31/23 1504 187 lb (84.8 kg)     Height 12/31/23 1504 5' 3 (1.6 m)     Head Circumference --      Peak Flow --      Pain Score 12/31/23 1504 0     Pain Loc --      Pain Education --      Exclude from Growth Chart --     Most recent vital signs: Vitals:   12/31/23 1503  BP: (!) 136/91  Pulse: 100  Resp: 17  Temp: 98.3 F (36.8 C)  SpO2: 97%    General: Awake, no distress.  CV:  Good peripheral perfusion.  Regular rate and rhythm   Resp:  Normal effort.  Equal breath sounds bilaterally.  Abd:  No distention.  Soft, nontender.  No rebound or guarding. Other:  No lower extremity edema or calf tenderness.   ED Results / Procedures / Treatments   EKG  EKG viewed and interpreted by myself shows a normal sinus rhythm at 91 bpm with a narrow QRS, normal axis, normal intervals, nonspecific ST changes but no ST elevation.  RADIOLOGY  I have reviewed interpreted CT images.  No large PE seen on my evaluation. Radiologist read the CT scan as negative for PE or acute abnormality.   MEDICATIONS ORDERED IN ED: Medications - No data to display   IMPRESSION / MDM / ASSESSMENT AND PLAN / ED COURSE  I reviewed the triage vital signs and the nursing notes.  Patient's presentation is most consistent with acute presentation with potential threat to life or bodily function.  Patient presents the emergency department for shortness of breath, worse with exertion.  Symptoms started yesterday after donating plasma and continued to worsen, worse today with exertion.  Patient had an abnormal  EKG at her doctor was sent to the emergency department for further workup.  Given the patient's shortness of breath with mild tachycardia we will check labs including cardiac enzyme and a BNP.  Will obtain a CTA of the chest to rule out PE.  Will IV hydrate given the patient's plasma donation yesterday as she could be dehydrated.  EKG shows nonspecific findings but no ST elevation.  Patient's workup today is reassuring showing reassuring CBC, slight hypokalemia otherwise reassuring chemistry.  Troponin is normal.  BNP is normal.  CTA of the chest is negative.  Given the patient's reassuring workup we will discharge the patient home.  Will place on 1 week of potassium supplements have her follow-up with her doctor in 1 week to recheck her labs including potassium.  Patient agreeable to plan of care.  FINAL CLINICAL IMPRESSION(S) / ED DIAGNOSES    Dyspnea   Note:  This document was prepared using Dragon voice recognition software and may include unintentional dictation errors.   Dorothyann Drivers, MD 12/31/23 1700

## 2023-12-31 NOTE — ED Triage Notes (Signed)
 Patient to ED via POV from Dr Marykay office for an abnormal EKG. PT reports donating plasma yesterday and afterwards having SOB. Pt states it got better but at work today it started again.

## 2023-12-31 NOTE — Discharge Instructions (Addendum)
 Please take your potassium supplements as prescribed.  Please follow-up with your doctor in approximately 1 week to have your labs including potassium rechecked.  Return to the emergency department for any worsening shortness of breath chest pain or any other symptom concerning to yourself.

## 2024-02-18 ENCOUNTER — Emergency Department (HOSPITAL_COMMUNITY): Payer: Worker's Compensation

## 2024-02-18 ENCOUNTER — Emergency Department (HOSPITAL_COMMUNITY)
Admission: EM | Admit: 2024-02-18 | Discharge: 2024-02-18 | Disposition: A | Discharge status: Home / Self Care | Payer: Worker's Compensation | Attending: Emergency Medicine | Admitting: Emergency Medicine

## 2024-02-18 ENCOUNTER — Other Ambulatory Visit: Payer: Self-pay

## 2024-02-18 ENCOUNTER — Other Ambulatory Visit (HOSPITAL_COMMUNITY): Payer: Self-pay

## 2024-02-18 DIAGNOSIS — S92014A Nondisplaced fracture of body of right calcaneus, initial encounter for closed fracture: Secondary | ICD-10-CM | POA: Diagnosis not present

## 2024-02-18 DIAGNOSIS — W138XXA Fall from, out of or through other building or structure, initial encounter: Secondary | ICD-10-CM | POA: Insufficient documentation

## 2024-02-18 DIAGNOSIS — Y990 Civilian activity done for income or pay: Secondary | ICD-10-CM | POA: Diagnosis not present

## 2024-02-18 DIAGNOSIS — S99911A Unspecified injury of right ankle, initial encounter: Secondary | ICD-10-CM | POA: Diagnosis present

## 2024-02-18 MED ORDER — OXYCODONE-ACETAMINOPHEN 5-325 MG PO TABS
1.0000 | ORAL_TABLET | Freq: Four times a day (QID) | ORAL | 0 refills | Status: AC | PRN
  Filled 2024-02-18: qty 20, 5d supply, fill #0

## 2024-02-18 MED ORDER — OXYCODONE-ACETAMINOPHEN 5-325 MG PO TABS
1.0000 | ORAL_TABLET | Freq: Once | ORAL | Status: AC
  Administered 2024-02-18: 1 via ORAL
  Filled 2024-02-18: qty 1

## 2024-02-18 NOTE — ED Notes (Signed)
 Patient transported to X-ray

## 2024-02-18 NOTE — ED Provider Notes (Signed)
 [place] EMERGENCY DEPARTMENT AT [facility] Provider Note   CSN: [id number] Arrival date & time: 02/18/24  1032     Patient presents with: Ankle Pain   [name] is a 43 y.o. female.    Ankle Pain  Patient is a 5 female  She presents emergency room today with complaints of right foot/ankle pain after falling out of the [employer] truck and falling onto her right heel.  She states she has severe right heel pain did not strike her head did not lose consciousness no nausea or vomiting no confusion or slurred speech.  She denies any other areas of pain specifically no back pain.       Prior to Admission medications   Medication Sig Start Date End Date Taking? Authorizing Provider  oxyCODONE -acetaminophen  (PERCOCET/ROXICET) 5-325 MG tablet Take 1 tablet by mouth every 6 (six) hours as needed for severe pain (pain score 7-10). 02/18/24  Yes Yuleidy Rappleye S, PA  abatacept (ORENCIA) 250 MG injection Inject 250 mg into the vein every 30 (thirty) days.     [provider]  ACZONE  7.5 % GEL Apply 1 application  topically at bedtime. qhs to face 08/27/22   Hester Alm BROCKS, MD  ALPRAZolam (XANAX) 0.25 MG tablet Take 0.25 mg by mouth 3 (three) times daily as needed. 09/16/18   [provider]  buPROPion (WELLBUTRIN XL) 150 MG 24 hr tablet Take 150 mg by mouth every morning.  10/16/19   [provider]  celecoxib (CELEBREX) 200 MG capsule Take 200 mg by mouth daily. 11/04/19   [provider]  cetirizine  (ZYRTEC ) 10 MG tablet Take 10 mg by mouth at bedtime.    [provider]  cholecalciferol (VITAMIN D3) 25 MCG (1000 UNIT) tablet Take 1,000 Units by mouth daily.    [provider]  Dapsone  7.5 % GEL Apply 1 application topically daily. 06/16/19   [provider]  docusate sodium  (COLACE) 100 MG capsule Take 1 capsule (100 mg total) by mouth 2 (two) times daily. To keep stools soft Patient not taking: Reported on  05/19/2020 12/04/19   Verdon Keen, MD  doxycycline  (ORACEA ) 40 MG capsule Take 1 capsule (40 mg total) by mouth every morning. Take with food and drink 08/27/22   Hester Alm BROCKS, MD  famotidine  (PEPCID ) 20 MG tablet Take 1 tablet (20 mg total) by mouth daily for 30 days. Patient not taking: Reported on 11/20/2019 08/04/18 09/03/18  McShane, James A, MD  folic acid (FOLVITE) 1 MG tablet Take 1 mg by mouth daily.    [provider]  methotrexate (RHEUMATREX) 7.5 MG tablet Take 20 mg by mouth once a week. Caution Chemotherapy. Protect from light.    [provider]  omeprazole (PRILOSEC) 40 MG capsule Take 40 mg by mouth every morning.     [provider]  oxyCODONE  (OXY IR/ROXICODONE ) 5 MG immediate release tablet Take 1 tablet (5 mg total) by mouth every 4 (four) hours as needed for severe pain. Patient not taking: Reported on 05/19/2020 12/04/19   Verdon Keen, MD  pimecrolimus  (ELIDEL ) 1 % cream Apply topically in the morning. qam to face 09/07/20   Hester Alm BROCKS, MD  potassium chloride  (KLOR-CON  M) 10 MEQ tablet Take 1 tablet (10 mEq total) by mouth 2 (two) times daily. 12/31/23   Paduchowski, Kevin, MD  pregabalin (LYRICA) 25 MG capsule Take 25 mg by mouth 2 (two) times daily.    [provider]  sertraline  (ZOLOFT ) 100  MG tablet Take 150 mg by mouth every morning.     [provider]  solifenacin (VESICARE) 5 MG tablet Take 5 mg by mouth every morning.  10/22/19   [provider]  triamterene-hydrochlorothiazide  (MAXZIDE-25) 37.5-25 MG tablet Take 1 capsule by mouth daily.     [provider]  vitamin B-12 (CYANOCOBALAMIN) 500 MCG tablet Take 500 mcg by mouth daily.    [provider]    Allergies: Patient has no known allergies.    Review of Systems  Updated Vital Signs BP (!) 150/78 (BP Location: Right Arm)   Pulse 82   Temp 98.1 F (36.7 C) (Oral)   Resp 18   Ht 5' (1.524 m)   Wt 86.2 kg   LMP 11/12/2019    SpO2 96%   BMI 37.11 kg/m   Physical Exam Vitals and nursing note reviewed.  Constitutional:      General: She is not in acute distress.    Appearance: Normal appearance. She is not ill-appearing.  HENT:     Head: Normocephalic and atraumatic.     Mouth/Throat:     Mouth: Mucous membranes are moist.  Eyes:     General: No scleral icterus.       Right eye: No discharge.        Left eye: No discharge.     Conjunctiva/sclera: Conjunctivae normal.  Cardiovascular:     Comments: DP PT pulses 2+ and symmetric cap refill less than 2 seconds Pulmonary:     Effort: Pulmonary effort is normal.     Breath sounds: No stridor.  Abdominal:     Tenderness: There is no abdominal tenderness.  Musculoskeletal:     Comments: Tenderness and swelling of the right hindfoot.  No lateral or medial malleoli or tenderness. No knee tenderness, no tibial tenderness, no hip tenderness.  No C, T, L-spine tenderness.  Skin:    General: Skin is warm and dry.  Neurological:     Mental Status: She is alert and oriented to person, place, and time. Mental status is at baseline.     (all labs ordered are listed, but only abnormal results are displayed) Labs Reviewed - No data to display  EKG: None  Radiology: DG Ankle Complete Right Result Date: 02/18/2024 CLINICAL DATA:  pain. EXAM: RIGHT ANKLE - COMPLETE 3+ VIEW COMPARISON:  None Available. FINDINGS: There is comminuted fracture of the calcaneus with extension up to the articular surface with cuboid and probable with subtalar joint. Calcaneal spur noted along the Achilles tendon and Plantar aponeurosis attachment sites. No other acute fracture or dislocation. No aggressive osseous lesion. Ankle mortise appears intact. There is moderate soft tissue swelling around the ankle joint. No radiopaque foreign bodies. IMPRESSION: Comminuted fracture of the calcaneus. Electronically Signed   By: Ree Molt M.D.   On: 02/18/2024 11:43     Procedures    Medications Ordered in the ED  oxyCODONE -acetaminophen  (PERCOCET/ROXICET) 5-325 MG per tablet 1 tablet (1 tablet Oral Given 02/18/24 1043)  oxyCODONE -acetaminophen  (PERCOCET/ROXICET) 5-325 MG per tablet 1 tablet (1 tablet Oral Given 02/18/24 1227)    Clinical Course as of 02/18/24 1302  Tue Feb 18, 2024  1216 Sharl in office within 7 days Elevate  Will need surgery  [WF]    Clinical Course User Index [WF] Neldon Hamp RAMAN, [place]  Medical Decision Making Amount and/or Complexity of Data Reviewed Radiology: ordered.  Risk Prescription drug management.   Patient is a 80 female  She presents emergency room today with complaints of right foot/ankle pain after falling out of the [employer] truck and falling onto her right heel.  She states she has severe right heel pain did not strike her head did not lose consciousness no nausea or vomiting no confusion or slurred speech.  She denies any other areas of pain specifically no back pain.  X-ray imaging shows comminuted right calcaneal fracture.  CT foot obtained no additional fractures.  Discussed with Ozell Purchase of orthopedic surgery who recommends bulky Jones splint, ice elevation and keeping splint in place and following up with Sharl of orthopedics.  Patient updated on this recommendation, she will be nonweightbearing, elevate, stay in splint and was given Percocet for acute pain related to calcaneus fracture.  She understands that she will likely need surgery.   Final diagnoses:  Closed nondisplaced fracture of body of right calcaneus, initial encounter    ED Discharge Orders          Ordered    oxyCODONE -acetaminophen  (PERCOCET/ROXICET) 5-325 MG tablet  Every 6 hours PRN        02/18/24 1300               Neldon Hamp RAMAN, [place] 02/18/24 1537    Elnor Jayson LABOR, DO 02/25/24 [phone]

## 2024-02-18 NOTE — Discharge Instructions (Addendum)
 He will need to follow-up with Dr. Sharl of orthopedic surgery within 7 days preferably by the end of this week otherwise early next week.  Call to make an appointment. You were seen in the emergency department and found to have a calcaneal fracture.  Please use Tylenol  or ibuprofen  for pain.  You may use 600 mg ibuprofen  every 6 hours or 1000 mg of Tylenol  every 6 hours.  You may choose to alternate between the 2.  This would be most effective.  Not to exceed 4 g of Tylenol  within 24 hours.  Not to exceed 3200 mg ibuprofen  24 hours.   If you do take a dose of percocet make sure to take 500mg  instead of 1000mg  for one dose of tylenol .

## 2024-02-18 NOTE — ED Triage Notes (Signed)
 Pt. Stated, I fell out of the bay door at [employer] were I work around 1000 this morning.My right ankle got caught and it turned.

## 2024-02-21 ENCOUNTER — Other Ambulatory Visit (HOSPITAL_BASED_OUTPATIENT_CLINIC_OR_DEPARTMENT_OTHER): Payer: Self-pay

## 2024-02-21 MED ORDER — OXYCODONE HCL 5 MG PO TABS
5.0000 mg | ORAL_TABLET | ORAL | 0 refills | Status: AC
  Filled 2024-02-21: qty 30, 5d supply, fill #0

## 2024-03-11 ENCOUNTER — Other Ambulatory Visit: Payer: Self-pay | Admitting: Physician Assistant

## 2024-03-11 ENCOUNTER — Ambulatory Visit
Admission: RE | Admit: 2024-03-11 | Discharge: 2024-03-11 | Disposition: A | Discharge status: Home / Self Care | Source: Ambulatory Visit | Attending: Physician Assistant | Admitting: Physician Assistant

## 2024-03-11 DIAGNOSIS — S62627A Displaced fracture of medial phalanx of left little finger, initial encounter for closed fracture: Secondary | ICD-10-CM

## 2024-06-16 ENCOUNTER — Encounter: Admitting: Internal Medicine
